# Patient Record
Sex: Male | Born: 1950 | Race: White | Hispanic: No | Marital: Married | Smoking: Former smoker
Health system: Southern US, Community
[De-identification: ages and names within clinical notes are randomized; demographics above are authoritative.]

## PROBLEM LIST (undated history)

## (undated) ENCOUNTER — Emergency Department (HOSPITAL_COMMUNITY): Admission: EM | Payer: Medicare Other | Source: Home / Self Care

## (undated) ENCOUNTER — Emergency Department (HOSPITAL_COMMUNITY): Admission: EM | Payer: Medicare Other

## (undated) DIAGNOSIS — Z952 Presence of prosthetic heart valve: Secondary | ICD-10-CM

## (undated) DIAGNOSIS — B1920 Unspecified viral hepatitis C without hepatic coma: Secondary | ICD-10-CM

## (undated) DIAGNOSIS — F0390 Unspecified dementia without behavioral disturbance: Secondary | ICD-10-CM

## (undated) DIAGNOSIS — J449 Chronic obstructive pulmonary disease, unspecified: Secondary | ICD-10-CM

## (undated) DIAGNOSIS — Z9089 Acquired absence of other organs: Secondary | ICD-10-CM

## (undated) DIAGNOSIS — T48201A Poisoning by unspecified drugs acting on muscles, accidental (unintentional), initial encounter: Secondary | ICD-10-CM

## (undated) DIAGNOSIS — Z8719 Personal history of other diseases of the digestive system: Secondary | ICD-10-CM

## (undated) DIAGNOSIS — F32A Depression, unspecified: Secondary | ICD-10-CM

## (undated) DIAGNOSIS — E119 Type 2 diabetes mellitus without complications: Secondary | ICD-10-CM

## (undated) DIAGNOSIS — K219 Gastro-esophageal reflux disease without esophagitis: Secondary | ICD-10-CM

## (undated) DIAGNOSIS — R569 Unspecified convulsions: Secondary | ICD-10-CM

## (undated) DIAGNOSIS — K661 Hemoperitoneum: Secondary | ICD-10-CM

## (undated) DIAGNOSIS — R7303 Prediabetes: Secondary | ICD-10-CM

## (undated) DIAGNOSIS — R600 Localized edema: Secondary | ICD-10-CM

## (undated) DIAGNOSIS — F329 Major depressive disorder, single episode, unspecified: Secondary | ICD-10-CM

## (undated) DIAGNOSIS — E7800 Pure hypercholesterolemia, unspecified: Secondary | ICD-10-CM

## (undated) DIAGNOSIS — K5792 Diverticulitis of intestine, part unspecified, without perforation or abscess without bleeding: Secondary | ICD-10-CM

## (undated) DIAGNOSIS — F419 Anxiety disorder, unspecified: Secondary | ICD-10-CM

## (undated) DIAGNOSIS — K625 Hemorrhage of anus and rectum: Secondary | ICD-10-CM

## (undated) DIAGNOSIS — Z973 Presence of spectacles and contact lenses: Secondary | ICD-10-CM

## (undated) DIAGNOSIS — N210 Calculus in bladder: Secondary | ICD-10-CM

## (undated) DIAGNOSIS — N200 Calculus of kidney: Secondary | ICD-10-CM

## (undated) DIAGNOSIS — R9431 Abnormal electrocardiogram [ECG] [EKG]: Secondary | ICD-10-CM

## (undated) HISTORY — DX: Presence of prosthetic heart valve: Z95.2

## (undated) HISTORY — PX: KNEE SURGERY: SHX244

## (undated) HISTORY — DX: Personal history of other diseases of the digestive system: Z87.19

## (undated) HISTORY — PX: COLON SURGERY: SHX602

## (undated) HISTORY — PX: CHOLECYSTECTOMY: SHX55

## (undated) HISTORY — DX: Hemoperitoneum: K66.1

## (undated) HISTORY — PX: JOINT REPLACEMENT: SHX530

## (undated) HISTORY — PX: CARDIAC SURGERY: SHX584

## (undated) HISTORY — DX: Acquired absence of other organs: Z90.89

## (undated) HISTORY — DX: Poisoning by unspecified drugs acting on muscles, accidental (unintentional), initial encounter: T48.201A

## (undated) HISTORY — PX: CRANIOTOMY FOR TEMPORAL LOBECTOMY: SUR342

## (undated) HISTORY — PX: HX GALL BLADDER SURGERY/CHOLE: SHX55

## (undated) HISTORY — PX: HX KNEE REPLACMENT: SHX125

## (undated) HISTORY — PX: HX AORTIC VALVE REPLACEMENT: SHX41

## (undated) HISTORY — PX: HX COLONOSCOPY: 2100001147

---
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
## 2000-08-13 ENCOUNTER — Emergency Department: Payer: Self-pay

## 2001-02-04 ENCOUNTER — Inpatient Hospital Stay: Payer: Self-pay

## 2001-02-16 ENCOUNTER — Ambulatory Visit: Payer: Self-pay

## 2001-02-19 ENCOUNTER — Ambulatory Visit: Payer: Self-pay

## 2001-09-27 ENCOUNTER — Emergency Department: Payer: Self-pay

## 2003-09-11 ENCOUNTER — Ambulatory Visit: Payer: Self-pay

## 2005-12-30 ENCOUNTER — Emergency Department: Payer: Self-pay

## 2006-04-05 ENCOUNTER — Emergency Department: Payer: Self-pay

## 2008-04-06 ENCOUNTER — Inpatient Hospital Stay (HOSPITAL_BASED_OUTPATIENT_CLINIC_OR_DEPARTMENT_OTHER)
Admission: EM | Admit: 2008-04-06 | Discharge: 2008-04-06 | Disposition: A | Discharge status: Home / Self Care | Payer: PRIVATE HEALTH INSURANCE

## 2008-04-06 ENCOUNTER — Emergency Department: Payer: Self-pay

## 2008-04-15 HISTORY — PX: HX COLECTOMY: SHX59

## 2011-08-04 ENCOUNTER — Inpatient Hospital Stay
Admission: EM | Admit: 2011-08-04 | Discharge: 2011-08-05 | DRG: 694 | Disposition: A | Discharge status: Home / Self Care | Payer: Managed Care, Other (non HMO) | Attending: Internal Medicine | Admitting: Internal Medicine

## 2011-08-04 ENCOUNTER — Emergency Department (EMERGENCY_DEPARTMENT_HOSPITAL): Payer: Managed Care, Other (non HMO)

## 2011-08-04 ENCOUNTER — Emergency Department
Admission: EM | Admit: 2011-08-04 | Discharge: 2011-08-04 | Disposition: A | Discharge status: Home / Self Care | Payer: Managed Care, Other (non HMO) | Source: Home / Self Care

## 2011-08-04 DIAGNOSIS — Z954 Presence of other heart-valve replacement: Secondary | ICD-10-CM | POA: Insufficient documentation

## 2011-08-04 DIAGNOSIS — N200 Calculus of kidney: Secondary | ICD-10-CM | POA: Insufficient documentation

## 2011-08-04 DIAGNOSIS — G40909 Epilepsy, unspecified, not intractable, without status epilepticus: Secondary | ICD-10-CM | POA: Diagnosis present

## 2011-08-04 DIAGNOSIS — N179 Acute kidney failure, unspecified: Secondary | ICD-10-CM | POA: Diagnosis present

## 2011-08-04 DIAGNOSIS — F17200 Nicotine dependence, unspecified, uncomplicated: Secondary | ICD-10-CM | POA: Diagnosis present

## 2011-08-04 DIAGNOSIS — N210 Calculus in bladder: Secondary | ICD-10-CM | POA: Diagnosis present

## 2011-08-04 DIAGNOSIS — Z9049 Acquired absence of other specified parts of digestive tract: Secondary | ICD-10-CM

## 2011-08-04 DIAGNOSIS — R110 Nausea: Secondary | ICD-10-CM | POA: Insufficient documentation

## 2011-08-04 DIAGNOSIS — R509 Fever, unspecified: Secondary | ICD-10-CM | POA: Insufficient documentation

## 2011-08-04 DIAGNOSIS — Z8619 Personal history of other infectious and parasitic diseases: Secondary | ICD-10-CM

## 2011-08-04 DIAGNOSIS — R1032 Left lower quadrant pain: Secondary | ICD-10-CM | POA: Insufficient documentation

## 2011-08-04 DIAGNOSIS — N1330 Unspecified hydronephrosis: Secondary | ICD-10-CM | POA: Diagnosis present

## 2011-08-04 DIAGNOSIS — N401 Enlarged prostate with lower urinary tract symptoms: Secondary | ICD-10-CM | POA: Diagnosis present

## 2011-08-04 DIAGNOSIS — N135 Crossing vessel and stricture of ureter without hydronephrosis: Secondary | ICD-10-CM | POA: Diagnosis present

## 2011-08-04 HISTORY — DX: Presence of spectacles and contact lenses: Z97.3

## 2011-08-04 HISTORY — DX: Presence of prosthetic heart valve: Z95.2

## 2011-08-04 HISTORY — DX: Diverticulitis of intestine, part unspecified, without perforation or abscess without bleeding: K57.92

## 2011-08-04 LAB — CBC
BASOPHIL #: 0.1 K/uL (ref 0.0–0.10)
BASOPHIL #: 0.1 10*3/uL (ref 0.0–0.10)
BASOPHILS %: 1.1 % (ref 0–2.50)
BASOPHILS %: 0.4 % (ref 0–2.50)
EOSINOPHIL #: 0 K/uL (ref 0.00–0.50)
EOSINOPHIL #: 0 10*3/uL (ref 0.00–0.50)
EOSINOPHIL %: 0.4 % (ref 0.0–5.2)
EOSINOPHIL %: 0.3 % (ref 0.0–5.2)
HCT: 43 % (ref 40.0–54.0)
HCT: 39.5 % — ABNORMAL LOW (ref 40.0–54.0)
HGB: 14.2 g/dL (ref 13.7–18.0)
HGB: 13.1 g/dL — ABNORMAL LOW (ref 13.7–18.0)
LYMPHOCYTE #: 0.9 10*3/uL (ref 0.7–3.20)
LYMPHOCYTE #: 0.8 10*3/uL (ref 0.7–3.20)
LYMPHOCYTE %: 10.7 % — ABNORMAL LOW (ref 15.0–43.0)
LYMPHOCYTE %: 6 % — ABNORMAL LOW (ref 15.0–43.0)
MCH: 31.9 pg (ref 28.3–34.3)
MCH: 32.3 pg (ref 28.3–34.3)
MCHC: 33 g/dL (ref 32.0–36.0)
MCHC: 33.2 g/dL (ref 32.0–36.0)
MCV: 96.8 fL — AB (ref 82.0–100.0)
MCV: 97.4 fL (ref 82.0–100.0)
MONOCYTE #: 0.7 K/uL (ref 0.20–0.90)
MONOCYTE #: 1 10*3/uL — ABNORMAL HIGH (ref 0.20–0.90)
MONOCYTE %: 7.7 % (ref 4.8–12.0)
MONOCYTE %: 7.9 % (ref 4.8–12.0)
MPV: 9.3 fL (ref 7.4–10.45)
MPV: 9.2 fL (ref 7.4–10.45)
NRBC ABSOLUTE: 0 10*3/uL
NRBC ABSOLUTE: 0 K/uL
NRBC: 0.1 /100{WBCs} — ABNORMAL HIGH (ref 0–0)
NRBC: 0 /100 WBC (ref 0–0)
PLATELET COUNT: 159 K/uL (ref 150–400)
PLATELET COUNT: 137 K/uL — ABNORMAL LOW (ref 150–400)
PMN #: 7.1 K/uL — ABNORMAL HIGH (ref 1.5–6.5)
PMN #: 11.3 K/uL — ABNORMAL HIGH (ref 1.5–6.5)
PMN %: 80.1 % — ABNORMAL HIGH (ref 43.0–76.0)
PMN %: 85.4 % — ABNORMAL HIGH (ref 43.0–76.0)
RBC: 4.06 M/uL — ABNORMAL LOW (ref 4.5–6.0)
RDW: 13.6 % (ref 11.0–16.0)
RDW: 13.4 % (ref 11.0–16.0)
WBC: 8.8 10*3/uL (ref 4.0–11.0)
WBC: 13.3 10*3/uL — ABNORMAL HIGH (ref 4.0–11.0)

## 2011-08-04 LAB — COMPREHENSIVE METABOLIC PROFILE - BMC/JMC ONLY
ALBUMIN/GLOBULIN RATIO: 1.3
ALBUMIN: 3.9 g/dL (ref 3.5–5.0)
ALKALINE PHOSPHATASE: 70 IU/L (ref 38–126)
ALT (SGPT): 38 IU/L (ref 17–63)
ANION GAP: 9 mmol/L (ref 3–11)
AST (SGOT): 44 IU/L — ABNORMAL HIGH (ref 15–41)
BILIRUBIN, TOTAL: 1.1 mg/dL (ref 0.3–1.2)
BUN: 24 mg/dL — ABNORMAL HIGH (ref 6–20)
CALCIUM: 9.5 mg/dL (ref 8.8–10.2)
CARBON DIOXIDE: 23 mmol/L (ref 22–32)
CHLORIDE: 103 mmol/L (ref 101–111)
CREATININE: 1.36 mg/dL — ABNORMAL HIGH (ref 0.61–1.24)
ESTIMATED GLOMERULAR FILTRATION RATE: 53 mL/min — ABNORMAL LOW (ref >60)
GLUCOSE: 111 mg/dL — ABNORMAL HIGH (ref 70–110)
POTASSIUM: 4.2 mmol/L (ref 3.4–5.1)
SODIUM: 135 mmol/L — ABNORMAL LOW (ref 136–145)
TOTAL PROTEIN: 6.7 g/dL (ref 6.4–8.3)

## 2011-08-04 LAB — URINALYSIS WITH CULTURE REFLEX IF INDICATED BMC/JMC ONLY
BILIRUBIN,URINE: NEGATIVE mg/dL
GLUCOSE,URINE: NORMAL mg/dL
KETONE, URINE: NEGATIVE mg/dL
NITRITES, URINE: NEGATIVE
PH,URINE: 6.5 (ref 5.0–7.5)
PROTEIN, URINE: 25 mg/dL — AB
SPECIFIC GRAVITY,URINE: 1.02 (ref 1.005–1.020)
UROBILINOGEN,URINE: NORMAL mg/dL (ref 0.2–1.0)

## 2011-08-04 LAB — BASIC METABOLIC PROFILE - BMC/JMC ONLY
ANION GAP: 4 mmol/L (ref 3–11)
BUN: 24 mg/dL — ABNORMAL HIGH (ref 6–20)
CALCIUM: 8.8 mg/dL (ref 8.8–10.2)
CARBON DIOXIDE: 25 mmol/L (ref 22–32)
CHLORIDE: 107 mmol/L (ref 101–111)
CREATININE: 1.57 mg/dL — ABNORMAL HIGH (ref 0.61–1.24)
ESTIMATED GLOMERULAR FILTRATION RATE: 45 mL/min — ABNORMAL LOW (ref >60)
GLUCOSE: 120 mg/dL — ABNORMAL HIGH (ref 70–110)
POTASSIUM: 4.1 mmol/L (ref 3.4–5.1)
SODIUM: 136 mmol/L (ref 136–145)

## 2011-08-04 LAB — MICROSCOPIC,UA REFLEX TO CULT - JMC ONLY

## 2011-08-04 LAB — PT/INR
INR NORMALIZED: 2.04
PROTHROMBIN TIME: 21.6 s (ref 9.8–11.1)

## 2011-08-04 LAB — LIPASE: LIPASE: 37 U/L (ref 22–51)

## 2011-08-04 MED ORDER — ONDANSETRON HCL (PF) 4 MG/2 ML INJECTION SOLUTION
4.0000 mg | Freq: Three times a day (TID) | INTRAMUSCULAR | Status: DC | PRN
Start: 2011-08-04 — End: 2011-08-05
  Filled 2011-08-04: qty 2

## 2011-08-04 MED ORDER — SODIUM CHLORIDE 0.9 % (FLUSH) INJECTION SYRINGE
INJECTION | INTRAMUSCULAR | Status: AC
Start: 2011-08-04 — End: 2011-08-04
  Administered 2011-08-04: 10 mL via INTRAVENOUS
  Filled 2011-08-04: qty 5

## 2011-08-04 MED ORDER — SODIUM CHLORIDE 0.9 % INTRAVENOUS SOLUTION
INTRAVENOUS | Status: DC
Start: 2011-08-04 — End: 2011-08-04
  Administered 2011-08-04: 125 mL/h via INTRAVENOUS

## 2011-08-04 MED ORDER — HYDROMORPHONE 1 MG/ML INJECTION WRAPPER
INJECTION | INTRAMUSCULAR | Status: AC
Start: 2011-08-04 — End: 2011-08-04
  Administered 2011-08-04: 1 mg via INTRAVENOUS
  Filled 2011-08-04: qty 1

## 2011-08-04 MED ORDER — ONDANSETRON HCL (PF) 4 MG/2 ML INJECTION SOLUTION
INTRAMUSCULAR | Status: AC
Start: 2011-08-04 — End: 2011-08-04
  Administered 2011-08-04: 4 mg via INTRAVENOUS
  Filled 2011-08-04: qty 2

## 2011-08-04 MED ORDER — HYDROCODONE 5 MG-ACETAMINOPHEN 325 MG TABLET
1.00 | ORAL_TABLET | Freq: Four times a day (QID) | ORAL | Status: DC | PRN
Start: 2011-08-04 — End: 2012-09-04

## 2011-08-04 MED ORDER — TOPIRAMATE 100 MG TABLET
200.00 mg | ORAL_TABLET | Freq: Two times a day (BID) | ORAL | Status: DC
Start: 2011-08-04 — End: 2011-08-05
  Administered 2011-08-04: 0 mg via ORAL
  Administered 2011-08-05: 200 mg via ORAL

## 2011-08-04 MED ORDER — PROMETHAZINE 25 MG/ML INJECTION SOLUTION - IV
12.50 mg | INTRAMUSCULAR | Status: AC
Start: 2011-08-04 — End: 2011-08-04

## 2011-08-04 MED ORDER — TOPIRAMATE 100 MG TABLET
ORAL_TABLET | ORAL | Status: DC
Start: 2011-08-04 — End: 2011-08-05
  Administered 2011-08-04: 0 mg
  Filled 2011-08-04: qty 2

## 2011-08-04 MED ORDER — KETOROLAC 15 MG/ML INJECTION SOLUTION
30.00 mg | INTRAMUSCULAR | Status: AC
Start: 2011-08-04 — End: 2011-08-04

## 2011-08-04 MED ORDER — SODIUM CHLORIDE 0.9 % (FLUSH) INJECTION SYRINGE
10.0000 mL | INJECTION | Freq: Once | INTRAMUSCULAR | Status: AC
Start: 2011-08-04 — End: 2011-08-04

## 2011-08-04 MED ORDER — ONDANSETRON HCL (PF) 4 MG/2 ML INJECTION SOLUTION
4.00 mg | INTRAMUSCULAR | Status: AC
Start: 2011-08-04 — End: 2011-08-04

## 2011-08-04 MED ORDER — SODIUM CHLORIDE 0.9 % (FLUSH) INJECTION SYRINGE
2.5000 mL | INJECTION | Freq: Three times a day (TID) | INTRAMUSCULAR | Status: DC
Start: 2011-08-04 — End: 2011-08-05
  Administered 2011-08-04: 2.5 mL via INTRAVENOUS
  Administered 2011-08-05: 0 mL via INTRAVENOUS
  Filled 2011-08-04: qty 5

## 2011-08-04 MED ORDER — PROMETHAZINE 25 MG/ML INJECTION SOLUTION
INTRAMUSCULAR | Status: AC
Start: 2011-08-04 — End: 2011-08-04
  Administered 2011-08-04: 12.5 mg via INTRAVENOUS
  Filled 2011-08-04: qty 1

## 2011-08-04 MED ORDER — HYDROMORPHONE 1 MG/ML INJECTION WRAPPER
1.0000 mg | INJECTION | Freq: Once | INTRAMUSCULAR | Status: AC
Start: 2011-08-04 — End: 2011-08-04

## 2011-08-04 MED ORDER — HYDROMORPHONE 1 MG/ML INJECTION WRAPPER
1.00 mg | INJECTION | INTRAMUSCULAR | Status: AC
Start: 2011-08-04 — End: 2011-08-04

## 2011-08-04 MED ORDER — SODIUM CHLORIDE 0.9 % INTRAVENOUS SOLUTION
INTRAVENOUS | Status: DC
Start: 2011-08-04 — End: 2011-08-04
  Administered 2011-08-04: 0 via INTRAVENOUS

## 2011-08-04 MED ORDER — SODIUM CHLORIDE 0.9 % INTRAVENOUS SOLUTION
INTRAVENOUS | Status: DC
Start: 2011-08-04 — End: 2011-08-05

## 2011-08-04 MED ORDER — WARFARIN 5 MG TABLET
10.00 mg | ORAL_TABLET | Freq: Every evening | ORAL | Status: DC
Start: 2011-08-04 — End: 2011-08-05
  Administered 2011-08-04: 0 mg via ORAL
  Filled 2011-08-04: qty 2

## 2011-08-04 MED ORDER — SODIUM CHLORIDE 0.9 % IV BOLUS
1000.00 mL | INJECTION | Freq: Once | Status: AC
Start: 2011-08-04 — End: 2011-08-04
  Administered 2011-08-04: 0 mL via INTRAVENOUS
  Administered 2011-08-04 (×2): 1000 mL via INTRAVENOUS

## 2011-08-04 MED ORDER — SODIUM CHLORIDE 0.9 % (FLUSH) INJECTION SYRINGE
10.00 mL | INJECTION | INTRAMUSCULAR | Status: AC
Start: 2011-08-04 — End: 2011-08-04

## 2011-08-04 MED ORDER — ACETAMINOPHEN 325 MG TABLET
650.0000 mg | ORAL_TABLET | ORAL | Status: DC | PRN
Start: 2011-08-04 — End: 2011-08-05

## 2011-08-04 MED ORDER — SODIUM CHLORIDE 0.9 % (FLUSH) INJECTION SYRINGE
INJECTION | INTRAMUSCULAR | Status: AC
Start: 2011-08-04 — End: 2011-08-04
  Administered 2011-08-04: 10 mL via INTRAVENOUS
  Filled 2011-08-04: qty 10

## 2011-08-04 MED ORDER — HYDROMORPHONE (PF) 2 MG/ML INJECTION SYRINGE
2.00 mg | INJECTION | INTRAMUSCULAR | Status: DC | PRN
Start: 2011-08-04 — End: 2011-08-05
  Administered 2011-08-04 – 2011-08-05 (×4): 2 mg via INTRAVENOUS
  Filled 2011-08-04 (×4): qty 1

## 2011-08-04 MED ORDER — ONDANSETRON 4 MG DISINTEGRATING TABLET
4.00 mg | ORAL_TABLET | Freq: Three times a day (TID) | ORAL | Status: DC | PRN
Start: 2011-08-04 — End: 2012-09-04

## 2011-08-04 MED ORDER — ONDANSETRON 4 MG DISINTEGRATING TABLET
4.00 mg | ORAL_TABLET | Freq: Three times a day (TID) | ORAL | Status: DC | PRN
Start: 2011-08-04 — End: 2011-08-05

## 2011-08-04 MED ORDER — KETOROLAC 15 MG/ML INJECTION SOLUTION
INTRAMUSCULAR | Status: AC
Start: 2011-08-04 — End: 2011-08-04
  Administered 2011-08-04: 30 mg via INTRAVENOUS
  Filled 2011-08-04: qty 2

## 2011-08-04 MED ORDER — OXCARBAZEPINE 600 MG TABLET
600.00 mg | ORAL_TABLET | Freq: Two times a day (BID) | ORAL | Status: DC
Start: 2011-08-04 — End: 2011-08-05
  Administered 2011-08-04 – 2011-08-05 (×2): 0 mg via ORAL

## 2011-08-04 NOTE — Nurses Notes (Signed)
Pt arrived to unit via stretcher. No distress noted at this time but pt complains of left side pain. Received bedside report from ED nurse, Beth. Will administer prn meds when available. IV site in place to right hand.

## 2011-08-04 NOTE — ED Provider Notes (Signed)
HPI Comments: This is a 61 year old male with PMH positive for aortic valve replacement, seizure disorder, diverticulitis, and hepatitis C.  The patient presents here with complaints of left sided flank pain radiating to his groin area.  He rates the severity of his pain as 10/10.  He denies any associated fever, chills, or diarrhea.  These symptoms began yesterday.  He presents here for evaluation.    Patient is a 61 y.o. male presenting with abdominal pain. The history is provided by the patient. No language interpreter was used.   Abdominal Pain   This is a new problem. The current episode started yesterday. The problem has been gradually worsening. The pain is associated with an unknown factor. The pain is located in the LLQ. The pain is at a severity of 10/10. Associated symptoms include anorexia, fever and nausea. Pertinent negatives include belching, diarrhea, flatus, hematochezia, melena, vomiting, constipation, dysuria, frequency, hematuria, headaches, arthralgias and myalgias. Nothing aggravates the symptoms. Nothing relieves the symptoms.           Review of Systems   Constitutional: Positive for fever.   Gastrointestinal: Positive for nausea, abdominal pain and anorexia. Negative for vomiting, diarrhea, constipation, melena, hematochezia and flatus.   Genitourinary: Negative for dysuria, frequency and hematuria.   Musculoskeletal: Negative for myalgias and arthralgias.   Neurological: Negative for headaches.           History:   Past Medical History:  Past Medical History   Diagnosis Date   . Aortic valve replaced    . Seizures    . Diverticulitis    . Hepatitis C        Past Surgical History:  Past Surgical History   Procedure Date   . Hx knee replacment        Social History:  History   Substance Use Topics   . Smoking status: Current Everyday Smoker -- 0.5 packs/day   . Smokeless tobacco: Not on file   . Alcohol Use: No     History   Drug Use No       Family History:  No family history on file.         Physical Exam   Constitutional: He is oriented to person, place, and time. He appears well-developed and well-nourished.   HENT:   Head: Normocephalic and atraumatic.   Eyes: Conjunctivae normal and EOM are normal. Pupils are equal, round, and reactive to light.   Neck: Normal range of motion. Neck supple.   Cardiovascular: Normal rate, regular rhythm and normal heart sounds.    Pulmonary/Chest: Effort normal.   Abdominal: Soft. Bowel sounds are normal.   Musculoskeletal: Normal range of motion.   Neurological: He is alert and oriented to person, place, and time.   Skin: Skin is warm and dry.   Psychiatric: He has a normal mood and affect. His behavior is normal. Judgment and thought content normal.           ED Course:    Results:  Results for orders placed during the hospital encounter of 08/04/11 (from the past 12 hour(s))    -URINALYSIS W/RELFLEX TO CULTURE - [facility] ONLY     SOURCE, URINE                 CC                     COLOR, URINE  yellow  Range: YELLOW     APPEARANCE, URINE             hazy    Range: CLEAR     GLUCOSE,URINE mg/dL           normal  Range: NEGATIVE mg/dL     BILIRUBIN,URINE mg/dL         negative Range: NEGATIVE mg/dL     KETONE, URINE mg/dL           negative Range: NEGATIVE mg/dL     SPECIFIC GRAVITY,URINE        1.020   Range: 1.005 - 1.020     BLOOD, URINE                  5+ (*)  Range: NEGATIVE     PH,URINE                      6.5     Range: 5.0 - 7.5     PROTEIN, URINE mg/dL          25 (*)  Range: NEGATIVE mg/dL     UROBILINOGEN,URINE mg/dL      normal  Range: 0.2 - 1.0 mg/dL     NITRITES, URINE               negative Range: NEGATIVE     LEUKOCYTE ESTERASE, URINE     1+ (*)  Range: NEGATIVE    -MICROSCOPIC,UA REFLEX TO CULT - [facility] ONLY     WBC, URINE /hpf               11-20 (*)Range: 0 - 5 /hpf     RBC,URINE /hpf                6-10 (*)Range: 0 - 3 /hpf     SQUAMOUS EPITHELIAL CELL,UR /hpf  0-2     Range: 1 - 3 /hpf      BACTERIA, URINE               1+ (*)  Range: NONE SEEN    -CBC     WBC K/uL                      8.8     Range: 4.0 - 11.0 K/uL     RBC M/uL                      4.44 (*)Range: 4.5 - 6.0 M/uL     HGB g/dL                      16.1    Range: 13.7 - 18.0 g/dL     HCT %                         43.0    Range: 40.0 - 54.0 %     MCV fL                        96.8 (*)Range: 82.0 - 100.0 fL     MCH pg                        31.9    Range: 28.3 - 34.3 pg     MCHC g/dL  33.0    Range: 32.0 - 36.0 g/dL     RDW %                         13.6    Range: 11.0 - 16.0 %     PLATELET COUNT K/uL           159 (*) Range: 150 - 400 K/uL     MPV fL                        9.3     Range: 7.4 - 10.45 fL     NRBC /100 WBC                 0.1 (*) Range: 0 - 0 /100 WBC     NRBC ABSOLUTE K/uL            0.00                   PMN % %                       80.1 (*)Range: 43.0 - 76.0 %     LYMPHOCYTE % %                10.7 (*)Range: 15.0 - 43.0 %     MONOCYTE % %                  7.7     Range: 4.8 - 12.0 %     EOSINOPHIL % %                0.4     Range: 0.0 - 5.2 %     BASOPHILS % %                 1.1     Range: 0 - 2.50 %     PMN # K/uL                    7.10 (*)Range: 1.5 - 6.5 K/uL     LYMPHOCYTE # K/uL             0.90    Range: 0.7 - 3.20 K/uL     MONOCYTE # K/uL               0.70    Range: 0.20 - 0.90 K/uL     EOSINOPHIL # K/uL             0.00    Range: 0.00 - 0.50 K/uL     BASOPHIL # K/uL               0.10    Range: 0.0 - 0.10 K/uL    -COMPREHENSIVE METABOLIC PROFILE - [facility] ONLY     GLUCOSE mg/dL                 161 (*) Range: 70 - 110 mg/dL     BUN mg/dL                     24 (*)  Range: 6 - 20 mg/dL     CREATININE mg/dL              0.96 (*)Range: 0.61 - 1.24 mg/dL     ESTIMATED GLOMERULAR FILTRATION RA* ml/min  53 (*)  Range: >60 ml/min  SODIUM mmol/L                 135 (*) Range: 136 - 145 mmol/L     POTASSIUM mmol/L              4.2     Range: 3.4 - 5.1 mmol/L      CHLORIDE mmol/L               103     Range: 101 - 111 mmol/L     CARBON DIOXIDE mmol/L         23      Range: 22 - 32 mmol/L     ANION GAP mmol/L              9       Range: 3 - 11 mmol/L     CALCIUM mg/dL                 9.5     Range: 8.8 - 10.2 mg/dL     TOTAL PROTEIN g/dL            6.7     Range: 6.4 - 8.3 g/dL     ALBUMIN g/dL                  3.9     Range: 3.5 - 5.0 g/dL     ALBUMIN/GLOBULIN RATIO        1.3                    BILIRUBIN, TOTAL mg/dL        1.1     Range: 0.3 - 1.2 mg/dL     AST (SGOT) IU/L               44 (*)  Range: 15 - 41 IU/L     ALT (SGPT) IU/L               38      Range: 17 - 63 IU/L     ALKALINE PHOSPHATASE IU/L     70      Range: 38 - 126 IU/L    -LIPASE     LIPASE U/L                    37      Range: 22 - 51 U/L    CT ABDOMEN PELVIS WO IV CONTRAST (Preliminary result)   Result time:08/04/11 0706     ED Interpretation     6mm stone in the proximal left ureter with mild-moderate  hydronephrosis and perinephric stranding. 9mm left upper pole  nononstructing stone. Multiple large calcific densities in the  bladder likely relate to stones.       ED Course:  This patient is feeling much better after getting analgesia, and hydration.  Will discharge the patient to home with urology follow up.  His pain was controlled with one shot of dilaudid, and torodol.  He is in stable condition.    Evaluation / Plan:

## 2011-08-04 NOTE — Nurses Notes (Signed)
Coumadin booklet given to pt, pt denies any questions at this time.

## 2011-08-04 NOTE — H&P (Signed)
[facility]   General Medicine  Admission H&P    Date of Service:  08/04/2011  [name], 61 y.o. male  Date of Admission:  08/04/2011  Date of Birth:  April 10, 1951  PCP: Outside Medical Doctor    Information Obtained from: patient and health care provider  Chief Complaint:  Flank pain    HPI: [name] is a 61 y.o., White male who presents with flank pain.  Pt had onset of pain 2 days ago causing him to present to ED where he was diagnosed with kidney stones.  At that time he was treated with pain meds and discharged to home only to have him return this afternoon with increased pain not relieved with oral medication.  Pt states some nausea without vomiting, fever, or chills.    PAST MEDICAL:    Past Medical History   Diagnosis Date   . Aortic valve replaced    . Diverticulitis    . Hepatitis C    . Seizures      doesn't remember when last seizure was   . Wears glasses    . Hepatitis C     Past Surgical History   Procedure Date   . Hx knee replacment    . Hx gall bladder surgery/chole    . Hx aortic valve replacement         Medications Prior to Admission     Medication    OXcarbazepine (TRILEPTAL) 600 mg Oral Tablet    Take 600 mg by mouth Twice daily    Topiramate (TOPAMAX) 200 mg Oral Tablet    Take 200 mg by mouth Twice daily    warfarin (COUMADIN) 10 mg Oral Tablet    Take 10 mg by mouth QPM    hydrocodone-Acetaminophen (NORCO) 5-325 mg Oral Tablet tablet    Take 1 Tab by mouth Every 6 hours as needed for Pain    ondansetron (ZOFRAN ODT) 4 mg Oral Tablet, Rapid Dissolve    1 Tab (4 mg total) by Sublingual route Every 8 hours as needed for nausea/vomiting        No Known Allergies    Vaccinations:      Pneumovax: Does not meet criteria to receive.    Influenza: Not current vaccination season.    Family History  No family history on file.    Social History  History     Social History   . Marital Status: Married     Spouse Name: [name]/A     Number of Children: [name]/A    . Years of Education: [name]/A     Occupational History   . Not on file.     Social History Main Topics   . Smoking status: Current Everyday Smoker -- 0.5 packs/day   . Smokeless tobacco: Not on file   . Alcohol Use: Yes      rarely   . Drug Use: No   . Sexually Active: Not on file     Other Topics Concern   . Not on file     Social History Narrative   . No narrative on file       ROS: Other than ROS in the HPI, all other systems were negative.    Examination:  Temperature: 36.4 C (97.5 F) Heart Rate: 70  BP (Non-Invasive): 109/68 mmHg   Respiratory Rate: 18  SpO2-1: 99 % Pain Score: 6   General: moderate distress and vital signs reviewed  Eyes: Conjunctiva clear., Pupils equal  and round, reactive to light and accomodation.   HENT:Head atraumatic and normocephalic  Neck: No JVD or thyromegaly or lymphadenopathy  Lungs: Clear to auscultation bilaterally.   Cardiovascular: regular rate and rhythm  systolic murmur: early systolic 3/6, crescendo and decrescendo at lower left sternal border  Abdomen: Soft, non-tender, Bowel sounds normal, non-distended  Genito-urinary: Deferred  Extremities: No cyanosis or edema  Skin: Skin warm and dry  Neurologic: Grossly normal, CN II - XII grossly intact , Alert and oriented x3  Lymphatics: No lymphadenopathy  Psychiatric: Normal affect, behavior, memory, thought content, judgement, and speech.    Labs:    Lab Results for Last 24 Hours:    Results for orders placed during the hospital encounter of 08/04/11 (from the past 24 hour(s))   PT/INR       Component Value Range    PROTHROMBIN TIME 21.6 (*) 9.8 - 11.1 sec    INR NORMALIZED 2.04     CBC       Component Value Range    WBC 13.3 (*) 4.0 - 11.0 K/uL    RBC 4.06 (*) 4.5 - 6.0 M/uL    HGB 13.1 (*) 13.7 - 18.0 g/dL    HCT 16.1 (*) 09.6 - 54.0 %    MCV 97.4  82.0 - 100.0 fL    MCH 32.3  28.3 - 34.3 pg    MCHC 33.2  32.0 - 36.0 g/dL    RDW 04.5  40.9 - 81.1 %    PLATELET COUNT 137 (*) 150 - 400 K/uL    MPV 9.2  7.4 - 10.45 fL     NRBC 0  0 - 0 /100 WBC    NRBC ABSOLUTE 0.00      PMN % 85.4 (*) 43.0 - 76.0 %    LYMPHOCYTE % 6.0 (*) 15.0 - 43.0 %    MONOCYTE % 7.9  4.8 - 12.0 %    EOSINOPHIL % 0.3  0.0 - 5.2 %    BASOPHILS % 0.4  0 - 2.50 %    PMN # 11.30 (*) 1.5 - 6.5 K/uL    LYMPHOCYTE # 0.80  0.7 - 3.20 K/uL    MONOCYTE # 1.00 (*) 0.20 - 0.90 K/uL    EOSINOPHIL # 0.00  0.00 - 0.50 K/uL    BASOPHIL # 0.10  0.0 - 0.10 K/uL   BASIC METABOLIC PROFILE - [facility] ONLY       Component Value Range    GLUCOSE 120 (*) 70 - 110 mg/dL    BUN 24 (*) 6 - 20 mg/dL    CREATININE 9.14 (*) 0.61 - 1.24 mg/dL    ESTIMATED GLOMERULAR FILTRATION RATE 45 (*) >60 ml/min    SODIUM 136  136 - 145 mmol/L    POTASSIUM 4.1  3.4 - 5.1 mmol/L    CHLORIDE 107  101 - 111 mmol/L    CARBON DIOXIDE 25  22 - 32 mmol/L    ANION GAP 4  3 - 11 mmol/L    CALCIUM 8.8  8.8 - 10.2 mg/dL   URINALYSIS W/RELFLEX TO CULTURE - [facility] ONLY       Component Value Range    SOURCE, URINE CC      COLOR, URINE yellow  YELLOW    APPEARANCE, URINE hazy  CLEAR    GLUCOSE,URINE normal  NEGATIVE mg/dL    BILIRUBIN,URINE negative  NEGATIVE mg/dL    KETONE, URINE negative  NEGATIVE mg/dL    SPECIFIC GRAVITY,URINE 1.020  1.005 - 1.020    BLOOD, URINE 5+ (*) NEGATIVE    PH,URINE 6.5  5.0 - 7.5    PROTEIN, URINE 25 (*) NEGATIVE mg/dL    UROBILINOGEN,URINE normal  0.2 - 1.0 mg/dL    NITRITES, URINE negative  NEGATIVE    LEUKOCYTE ESTERASE, URINE 1+ (*) NEGATIVE   MICROSCOPIC,UA REFLEX TO CULT - [facility] ONLY       Component Value Range    WBC, URINE 11-20 (*) 0 - 5 /hpf    RBC,URINE 6-10 (*) 0 - 3 /hpf    SQUAMOUS EPITHELIAL CELL,UR 0-2  1 - 3 /hpf    BACTERIA, URINE 1+ (*) NONE SEEN   CBC       Component Value Range    WBC 8.8  4.0 - 11.0 K/uL    RBC 4.44 (*) 4.5 - 6.0 M/uL    HGB 14.2  13.7 - 18.0 g/dL    HCT 96.0  45.4 - 09.8 %    MCV 96.8 (*) 82.0 - 100.0 fL    MCH 31.9  28.3 - 34.3 pg    MCHC 33.0  32.0 - 36.0 g/dL    RDW 11.9  14.7 - 82.9 %    PLATELET COUNT 159 (*) 150 - 400 K/uL     MPV 9.3  7.4 - 10.45 fL    NRBC 0.1 (*) 0 - 0 /100 WBC    NRBC ABSOLUTE 0.00      PMN % 80.1 (*) 43.0 - 76.0 %    LYMPHOCYTE % 10.7 (*) 15.0 - 43.0 %    MONOCYTE % 7.7  4.8 - 12.0 %    EOSINOPHIL % 0.4  0.0 - 5.2 %    BASOPHILS % 1.1  0 - 2.50 %    PMN # 7.10 (*) 1.5 - 6.5 K/uL    LYMPHOCYTE # 0.90  0.7 - 3.20 K/uL    MONOCYTE # 0.70  0.20 - 0.90 K/uL    EOSINOPHIL # 0.00  0.00 - 0.50 K/uL    BASOPHIL # 0.10  0.0 - 0.10 K/uL   COMPREHENSIVE METABOLIC PROFILE - [facility] ONLY       Component Value Range    GLUCOSE 111 (*) 70 - 110 mg/dL    BUN 24 (*) 6 - 20 mg/dL    CREATININE 5.62 (*) 0.61 - 1.24 mg/dL    ESTIMATED GLOMERULAR FILTRATION RATE 53 (*) >60 ml/min    SODIUM 135 (*) 136 - 145 mmol/L    POTASSIUM 4.2  3.4 - 5.1 mmol/L    CHLORIDE 103  101 - 111 mmol/L    CARBON DIOXIDE 23  22 - 32 mmol/L    ANION GAP 9  3 - 11 mmol/L    CALCIUM 9.5  8.8 - 10.2 mg/dL    TOTAL PROTEIN 6.7  6.4 - 8.3 g/dL    ALBUMIN 3.9  3.5 - 5.0 g/dL    ALBUMIN/GLOBULIN RATIO 1.3      BILIRUBIN, TOTAL 1.1  0.3 - 1.2 mg/dL    AST (SGOT) 44 (*) 15 - 41 IU/L    ALT (SGPT) 38  17 - 63 IU/L    ALKALINE PHOSPHATASE 70  38 - 126 IU/L   LIPASE       Component Value Range    LIPASE 37  22 - 51 U/L       Imaging Studies:  CT:     EXAMINATION: UNENHANCED CT OF THE ABDOMEN AND PELVIS (RENAL STONE PROTOCOL).  REASON FOR PROCEDURE: Rule out kidney stones.   REPORT: The current examination is compared to a previous study of April 06, 2008. There has been interval cholecystectomy.   There also has been interval development of multiple bilateral nonobstructing intrarenal calculi. In addition, there is evidence of 2 obstructing left proximal ureteral calculi with upstream hydroureter, hydronephrosis, and perinephric stranding. In addition, there is evidence of multiple (3) faceted stones in the bladder lumen posteriorly measuring approximately 1-1.5 cm in diameter.    The remainder of the intra-abdominal organ systems is unremarkable. No evidence of free fluid, free air, mass effect, or lymphadenopathy. Unremarkable, unenhanced bowel.   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   IMPRESSION:   Obstructing proximal left ureteral calculi (2).   Intraluminal bladder stones.    DNR Status:  Full Code    Assessment/Plan:   Active Hospital Problems    Diagnosis   . Primary Problem: Kidney stone     Admit for pain control  IVF and strain all urine  Consult Dr Tonie Griffith  Continue home meds    DVT/PE Prophylaxis: Warfarin    [name], MD 08/04/2011, 10:29 PM

## 2011-08-04 NOTE — ED Nurses Note (Signed)
PT VERY UPSET REQUESTING MORE PAIN MEDS CASE DISCUSSED WITH DR. Freida Busman AND NO FUTHER RX FOR PAIN MEDS  GIVEN . PT INSTRUCTED TO SIGN IN FOR RECHECK [facility] WOULD BE GLAD TO RE-EVAL.PT.

## 2011-08-04 NOTE — ED Provider Notes (Signed)
HPI Comments: Patient was seen here this am for kidney stones.  He was sent home with Hydrocodone and was instructed to take 1 tab every 6 hrs for pain.  He states that they only last about an hour and a half and he was given 10 pills which will not last. He is returning here for follow up and wishing to receive a different pain med.    Patient is a 61 y.o. male presenting with flank pain. The history is provided by the patient.   Flank Pain  This is a new problem. The current episode started yesterday. The problem occurs constantly. The problem has not changed since onset.Pertinent negatives include no chest pain, no abdominal pain, no headaches and no shortness of breath. The symptoms are aggravated by exertion. The symptoms are relieved by rest. He has tried rest for the symptoms. The treatment provided mild relief.       61 Y/O MALE C/O LEFT FLANK PAIN. PT WAS SEEN IN THE ED EARLIER THIS AM AND WAS DIAGNOSED WITH A 6 MM KIDNEY STONE. PT STS THAT HE WAS SENT HOME WITH PERCOCET AND ZOFRAN THAT HAS "ONLY BEEN WORKING FOR AN HOUR AT A TIME" TO RELIEVE HIS SYMPTOMS. PT DENIES ANY NAUSEA, VOMITING, OR FEVER. PT STS THAT HE HAS RETURNED TO THE ED FOR SOMETHING TO HELP MANAGE HIS PAIN. PT HX OF AORTIC VALVE REPLACEMENT, SEIZURES,  AND HEPATITIS C.     Review of Systems   Constitutional: Negative for fever and diaphoresis.   HENT: Negative for sore throat and neck pain.    Respiratory: Negative for cough and shortness of breath.    Cardiovascular: Negative for chest pain.   Gastrointestinal: Negative for nausea, vomiting, abdominal pain and diarrhea.   Genitourinary: Positive for hematuria and flank pain (Moderate. Left flank pain.).   Musculoskeletal: Negative for back pain.   Skin: Negative for rash.   Neurological: Negative for syncope and headaches.   Psychiatric/Behavioral: Negative for confusion.     Past Medical History:  Past Medical History   Diagnosis Date   . Aortic valve replaced    . Seizures     . Diverticulitis    . Hepatitis C        Allergies:  Review of patient's allergies indicates no known allergies.    Past Surgical History:  Past Surgical History   Procedure Date   . Hx knee replacment        Social History:  History   Substance Use Topics   . Smoking status: Current Everyday Smoker -- 0.5 packs/day   . Smokeless tobacco: Not on file   . Alcohol Use: No     History   Drug Use No       Family History:  No family history on file.    Home Meds: The patient's home medications have been reviewed.    Physical Exam   Nursing note and vitals reviewed.    BP 128/72   Pulse 66   Temp 36.4 C (97.6 F)   Resp 16   Ht 1.829 m (6')   Wt 94.348 kg (208 lb)   BMI 28.21 kg/m2   SpO2 99%  Constitutional:  Well developed, well nourished.  Awake & alert. No distress.  Head:  Atraumatic.  Normocephalic.    Eyes:  PERRL.  EOMI.  Conjunctivae are not pale.  ENT:  Mucous membranes are moist and intact.  Oropharynx is clear and symmetric.  Patent airway.  Neck:  Supple.  Full ROM.  No JVD.  No lymphadenopathy.  Cardiovascular:  Regular rate.  Regular rhythm.  No murmurs, rubs, or gallops.    Pulmonary/Chest:  No evidence of respiratory distress.  Clear to auscultation bilaterally.  No wheezing, rales or rhonchi. Chest non-tender.  Abdominal:  Soft and non-distended.  There is no tenderness.  No rebound, guarding, or rigidity.  No organomegaly.  Good bowel sounds.    Back:  Mild left CVA tenderness. FROM.   Extremities:  No edema.   No cyanosis.  No clubbing.  Full range of motion in all extremities.  No calf tenderness.  Skin:  Skin is warm and dry.  No diaphoresis. No rash.   Neurological:  Alert, awake, and appropriate.  Normal speech.  Sensation normal. Motor strengths 5/5.   Psychiatric:  Good eye contact.  Normal interaction, affect, and behavior.    Course  Results for orders placed during the hospital encounter of 08/04/11 (from the past 12 hour(s))   PT/INR       Component Value Range     PROTHROMBIN TIME 21.6 (*) 9.8 - 11.1 sec    INR NORMALIZED 2.04     CBC       Component Value Range    WBC 13.3 (*) 4.0 - 11.0 K/uL    RBC 4.06 (*) 4.5 - 6.0 M/uL    HGB 13.1 (*) 13.7 - 18.0 g/dL    HCT 21.3 (*) 08.6 - 54.0 %    MCV 97.4  82.0 - 100.0 fL    MCH 32.3  28.3 - 34.3 pg    MCHC 33.2  32.0 - 36.0 g/dL    RDW 57.8  46.9 - 62.9 %    PLATELET COUNT 137 (*) 150 - 400 K/uL    MPV 9.2  7.4 - 10.45 fL    NRBC 0  0 - 0 /100 WBC    NRBC ABSOLUTE 0.00      PMN % 85.4 (*) 43.0 - 76.0 %    LYMPHOCYTE % 6.0 (*) 15.0 - 43.0 %    MONOCYTE % 7.9  4.8 - 12.0 %    EOSINOPHIL % 0.3  0.0 - 5.2 %    BASOPHILS % 0.4  0 - 2.50 %    PMN # 11.30 (*) 1.5 - 6.5 K/uL    LYMPHOCYTE # 0.80  0.7 - 3.20 K/uL    MONOCYTE # 1.00 (*) 0.20 - 0.90 K/uL    EOSINOPHIL # 0.00  0.00 - 0.50 K/uL    BASOPHIL # 0.10  0.0 - 0.10 K/uL   BASIC METABOLIC PROFILE - [facility] ONLY       Component Value Range    GLUCOSE 120 (*) 70 - 110 mg/dL    BUN 24 (*) 6 - 20 mg/dL    CREATININE 5.28 (*) 0.61 - 1.24 mg/dL    ESTIMATED GLOMERULAR FILTRATION RATE 45 (*) >60 ml/min    SODIUM 136  136 - 145 mmol/L    POTASSIUM 4.1  3.4 - 5.1 mmol/L    CHLORIDE 107  101 - 111 mmol/L    CARBON DIOXIDE 25  22 - 32 mmol/L    ANION GAP 4  3 - 11 mmol/L    CALCIUM 8.8  8.8 - 10.2 mg/dL         ---------------------- MEDICAL DECISION MAKING -----------------------      Vital Signs: Reviewed the patient?s vital signs.   Pulse oximetry interpretation: 98% room air. Non hypoxic.  Nursing Notes: Reviewed and utilized the nursing notes.      Old Medical Records: Reviewed    Laboratory Studies: Ordered and independently interpreted laboratory tests, see above.     Additional MDM and Provider Notes:     EXAMINATION: UNENHANCED CT OF THE ABDOMEN AND PELVIS (RENAL STONE PROTOCOL).   REASON FOR PROCEDURE: Rule out kidney stones.   REPORT: The current examination is compared to a previous study of April 06, 2008. There has been interval cholecystectomy.    There also has been interval development of multiple bilateral nonobstructing intrarenal calculi. In addition, there is evidence of 2 obstructing left proximal ureteral calculi with upstream hydroureter, hydronephrosis, and perinephric stranding. In addition, there is evidence of multiple (3) faceted stones in the bladder lumen posteriorly measuring approximately 1-1.5 cm in diameter.   The remainder of the intra-abdominal organ systems is unremarkable. No evidence of free fluid, free air, mass effect, or lymphadenopathy. Unremarkable, unenhanced bowel.   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   IMPRESSION:   Obstructing proximal left ureteral calculi (2).   Intraluminal bladder stones.   Status post cholecystectomy.    1710 - D/W Dr. Tonie Griffith; Will consult for kidney stone  1803 - D/W Dr. Yetta Barre; Will admit for medical management  ----------------------------- COUNSELING ------------------------------     Counseling: The emergency provider has spoken with the patient and discussed today?s findings, in addition to providing specific details for the plan of care.  Questions are answered and there is agreement with the plan.      ----------------------- IMPRESSION AND DISPOSITION --------------------      CLINICAL IMPRESSION   Ureterolithiasis  H/o AVR  H/o Hepatitis C  H/o Seizure Disorder    DISPOSITION   Disposition: Home  Patient condition: Stable     SCRIBE ATTESTATION   This note is prepared by Luvenia Heller, acting as Scribe for Dr. Bluford Kaufmann     The scribe's documentation has been prepared under my direction and personally reviewed by me in its entirety.  I confirm that the note above accurately reflects all work, treatment, procedures, and medical decision making performed by me, Dr. Bluford Kaufmann

## 2011-08-04 NOTE — ED Nurses Note (Addendum)
 Called Med Surg for pt transfer, no answer.

## 2011-08-04 NOTE — ED Nurses Note (Signed)
Patient was seen here this am for kidney stones.  He was sent home with Hydrocodone and was instructed to take 1 tab every 6 hrs for pain.  He states that they only last about an hour and a half and he was given 10 pills which will not last. He is returning here for follow up and wishing to receive a different pain med.

## 2011-08-04 NOTE — ED Nurses Note (Signed)
Line, labs and meds as ordered.

## 2011-08-04 NOTE — ED Nurses Note (Signed)
pt C/O back pain starting yesterday now radiating to the left abd area, C/O nausea, denies V/D/fever

## 2011-08-04 NOTE — ED Nurses Note (Signed)
Report at bedside to Kathy.

## 2011-08-04 NOTE — ED Nurses Note (Signed)
 Called Med Surg for pt transfer-no answer.

## 2011-08-04 NOTE — ED Nurses Note (Signed)
In room to check pt status. Pt reports improvement in pain from 9/10 to 4/10. VSS. Lungs clear following fluid bolus.

## 2011-08-04 NOTE — ED Nurses Note (Signed)

## 2011-08-04 NOTE — ED Nurses Note (Signed)
Christine in Med Surg asked that we hold pt until 7:20.

## 2011-08-04 NOTE — ED Nurses Note (Signed)
Pt reminded of need for urine specimen-unable at this time.  NAD noted, pt resting as he did not sleep during night, call bell within reach.  Wife at the bedside.

## 2011-08-05 ENCOUNTER — Other Ambulatory Visit: Payer: Self-pay | Admitting: UROLOGY

## 2011-08-05 ENCOUNTER — Inpatient Hospital Stay (HOSPITAL_COMMUNITY): Payer: Managed Care, Other (non HMO)

## 2011-08-05 ENCOUNTER — Encounter
Admission: EM | Disposition: A | Discharge status: Home / Self Care | Payer: Self-pay | Source: Home / Self Care | Attending: Internal Medicine

## 2011-08-05 ENCOUNTER — Inpatient Hospital Stay (HOSPITAL_COMMUNITY)
Admission: RE | Admit: 2011-08-05 | Discharge: 2011-08-05 | Disposition: A | Discharge status: Home / Self Care | Payer: Managed Care, Other (non HMO) | Source: Ambulatory Visit | Attending: UROLOGY | Admitting: UROLOGY

## 2011-08-05 DIAGNOSIS — N138 Other obstructive and reflux uropathy: Secondary | ICD-10-CM

## 2011-08-05 DIAGNOSIS — N210 Calculus in bladder: Secondary | ICD-10-CM

## 2011-08-05 DIAGNOSIS — N139 Obstructive and reflux uropathy, unspecified: Secondary | ICD-10-CM

## 2011-08-05 DIAGNOSIS — N200 Calculus of kidney: Secondary | ICD-10-CM

## 2011-08-05 LAB — BASIC METABOLIC PROFILE - BMC/JMC ONLY
ANION GAP: 5 mmol/L (ref 3–11)
BUN: 24 mg/dL — ABNORMAL HIGH (ref 6–20)
BUN: 25 mg/dL — ABNORMAL HIGH (ref 6–20)
CALCIUM: 8.5 mg/dL — ABNORMAL LOW (ref 8.8–10.2)
CALCIUM: 8.4 mg/dL — ABNORMAL LOW (ref 8.8–10.2)
CARBON DIOXIDE: 24 mmol/L (ref 22–32)
CARBON DIOXIDE: 22 mmol/L (ref 22–32)
CHLORIDE: 106 mmol/L (ref 101–111)
CHLORIDE: 105 mmol/L (ref 101–111)
CREATININE: 1.64 mg/dL — ABNORMAL HIGH (ref 0.61–1.24)
CREATININE: 1.65 mg/dL — ABNORMAL HIGH (ref 0.61–1.24)
ESTIMATED GLOMERULAR FILTRATION RATE: 43 mL/min — ABNORMAL LOW (ref >60)
ESTIMATED GLOMERULAR FILTRATION RATE: 43 mL/min — ABNORMAL LOW (ref >60)
GLUCOSE: 86 mg/dL (ref 70–110)
GLUCOSE: 100 mg/dL (ref 70–110)
POTASSIUM: 4.1 mmol/L (ref 3.4–5.1)
POTASSIUM: 3.8 mmol/L (ref 3.4–5.1)
SODIUM: 135 mmol/L — ABNORMAL LOW (ref 136–145)
SODIUM: 132 mmol/L — ABNORMAL LOW (ref 136–145)

## 2011-08-05 SURGERY — CYSTOSCOPY WITH STONE EXTRACTION
Anesthesia: Monitor Anesthesia Care | Site: Ureter | Wound class: Clean Contaminated Wounds-The respiratory, GI, Genital, or urinary

## 2011-08-05 MED ORDER — LIDOCAINE 2 % MUCOSAL JELLY IN APPLICATOR
Status: AC
Start: 2011-08-05 — End: 2011-08-05
  Filled 2011-08-05: qty 5

## 2011-08-05 MED ORDER — CEFTRIAXONE 1 GRAM/50 ML IN DEXTROSE (ISO-OSMOT) DUPLEX
1.0000 g | INJECTION | Freq: Once | INTRAVENOUS | Status: AC
Start: 2011-08-05 — End: 2011-08-05
  Administered 2011-08-05: 0 g via INTRAVENOUS
  Filled 2011-08-05: qty 50

## 2011-08-05 MED ORDER — TOPIRAMATE 100 MG TABLET
ORAL_TABLET | ORAL | Status: DC
Start: 2011-08-05 — End: 2011-08-05
  Filled 2011-08-05: qty 2

## 2011-08-05 MED ORDER — DEXTROSE 5 % IN WATER (D5W) INTRAVENOUS SOLUTION
1.00 g | INTRAVENOUS | Status: DC
Start: 2011-08-06 — End: 2011-08-05
  Filled 2011-08-05: qty 10

## 2011-08-05 MED ORDER — IOVERSOL 350 MG IODINE/ML INTRAVENOUS SOLUTION
50.00 mL | INTRAVENOUS | Status: AC
Start: 2011-08-05 — End: 2011-08-05
  Administered 2011-08-05: 50 mL via INTRAVENOUS

## 2011-08-05 SURGICAL SUPPLY — 7 items
CATH URET 5FR 70CM WHSTLTP ADPR KINK RST GRAD POLYUR STRL LF  DISP (UROLOGICAL SUPPLIES) ×1 IMPLANT
PACK CYSTO 29470 CS/10 (LAP) ×1 IMPLANT
PEN SURG MRKNG SKIN RLR LRG RESERVOIR REG TIP LBL VIOL STRL LF  6IN (MARK) ×1 IMPLANT
SET 81IN REG CLAMP N-PYRG IRRG 10 GTT/ML STR CSCP DEHP BLADDER STRL LF (IV TUBING & ACCESSORIES) ×1 IMPLANT
SOLUTION POVIODINE IODINE 4OZ CS/72 /EA (WOUND CARE SUPPLY) ×1 IMPLANT
SPONGE SURG 4X4IN 16 PLY RADOPQ BAND VISTEC STRL LF  BLU WHT (WOUND CARE SUPPLY) ×1 IMPLANT
STENT URETERAL MARDIS 18015601 ×1 IMPLANT

## 2011-08-05 NOTE — Nurses Notes (Signed)
Pt up at bedside attempting to urinate. Small drops noted, when asked if having problems urinating, pt denies. Will monitor.

## 2011-08-05 NOTE — Nurses Notes (Signed)
Pt relocated to room 203 for closer observation. Bed alarms turned of in case of any future confusion and for pt safety. Pt medicated for 5/10 RLQ pain.

## 2011-08-05 NOTE — Nurses Notes (Signed)
 Dr. Joshua paged r/t pt being unable to urinate at this time and bladder scan results.

## 2011-08-05 NOTE — Nurses Notes (Signed)
AVS reviewed with patient/care giver.  A written copy of the AVS and discharge instructions was given to the patient/care giver.  Questions sufficiently answered as needed.  Patient/care giver encouraged to follow up with PCP as indicated.  In the event of an emergency, patient/care giver instructed to call 911 or go to the nearest emergency room.     Patient's iv in right wrist/hand area taken out, with cath intact.  Pressure dressing placed over site.

## 2011-08-05 NOTE — Nurses Notes (Signed)
Pt found in another room, states he "got up to use the bathroom down the hall and thought this was his room." Pt oriented to person, place and situation. Will move pt closer to nurses station for closer observation.

## 2011-08-05 NOTE — Nurses Notes (Addendum)
Dr. Yetta Barre notified of urine retention and bladder scan results. Order received to straight cath.

## 2011-08-05 NOTE — Progress Notes (Signed)
[facility]  Medicine Progress Note    [name]  Date of service: 08/05/2011  Date of Admission:  08/04/2011    Hospital Day:  LOS: 1 day   Subjective: Pt continues with pain in  Flank.  Had difficulty with urinating last night with bladder scan showing greater than 500 ml.  Pt straight cath for removal with resolution of discomfort.  Still having problem with urinating but no discomfort at present     Vital Signs:  Temp (24hrs) Max:36.9 C (98.4 F)      Systolic (24hrs), Avg:118 mmHg, Min:109 mmHg, Max:128 mmHg    Diastolic (24hrs), Avg:66 mmHg, Min:60 mmHg, Max:72 mmHg    Temp  Avg: 36.6 C (97.8 F)  Min: 36.4 C (97.5 F)  Max: 36.9 C (98.4 F)  Pulse  Avg: 71.2   Min: 66   Max: 79   Resp  Avg: 18.3   Min: 16   Max: 20   SpO2  Avg: 95.7 %  Min: 88 %  Max: 99 %  Pain Score: 0  Fi02    I/O:  I/O last 24 hours:    Intake/Output Summary (Last 24 hours) at 08/05/11 0901  Last data filed at 08/05/11 0800   Gross per 24 hour   Intake   1864 ml   Output    700 ml   Net   1164 ml     I/O current shift:  04/22 0800 - 04/22 1559  In: 550 [P.O.:550]  Out: -   Blood Sugars: Point of Care Glucose Last 24 Hr Results:  No results found for this basename: GLUCOSEPOC:* in the last 24 hours      Current Facility-Administered Medications:  ondansetron (ZOFRAN ODT) rapid dissolve tablet 4 mg Sublingual Q8H PRN   oxcarbazepine (TRILEPTAL) tablet 600 mg Oral 2x/day   topiramate (TOPAMAX) tablet 200 mg Oral 2x/day   warfarin (COUMADIN) tablet 10 mg Oral QPM   NS premix infusion  Intravenous Continuous   acetaminophen (TYLENOL) tablet 650 mg Oral Q4H PRN   ondansetron (ZOFRAN) 2 mg/mL injection 4 mg Intravenous Q8H PRN   NS flush syringe 2.5 mL Intravenous Q8HRS   HYDROmorphone (DILAUDID) 2 mg/mL injection 2 mg Intravenous Q2H PRN   topiramate (TOPAMAX) tablet ---Cabinet Override          No Known Allergies    Physical Exam:  General: moderate distress and vital signs reviewed   Neck: No JVD or thyromegaly or lymphadenopathy  Lungs: Clear to auscultation bilaterally.   Cardiovascular: regular rate and rhythm  systolic murmur: early systolic 2/6, crescendo and decrescendo at lower left sternal border  Abdomen: Soft, non-tender, Bowel sounds normal, non-distended  Neurologic: sedate from medication    Labs:  Lab Results for Last 24 Hours:    Results for orders placed during the hospital encounter of 08/04/11 (from the past 24 hour(s))   PT/INR       Component Value Range    PROTHROMBIN TIME 21.6 (*) 9.8 - 11.1 sec    INR NORMALIZED 2.04     CBC       Component Value Range    WBC 13.3 (*) 4.0 - 11.0 K/uL    RBC 4.06 (*) 4.5 - 6.0 M/uL    HGB 13.1 (*) 13.7 - 18.0 g/dL    HCT 95.6 (*) 21.3 - 54.0 %    MCV 97.4  82.0 - 100.0 fL    MCH 32.3  28.3 - 34.3 pg  MCHC 33.2  32.0 - 36.0 g/dL    RDW 16.1  09.6 - 04.5 %    PLATELET COUNT 137 (*) 150 - 400 K/uL    MPV 9.2  7.4 - 10.45 fL    NRBC 0  0 - 0 /100 WBC    NRBC ABSOLUTE 0.00      PMN % 85.4 (*) 43.0 - 76.0 %    LYMPHOCYTE % 6.0 (*) 15.0 - 43.0 %    MONOCYTE % 7.9  4.8 - 12.0 %    EOSINOPHIL % 0.3  0.0 - 5.2 %    BASOPHILS % 0.4  0 - 2.50 %    PMN # 11.30 (*) 1.5 - 6.5 K/uL    LYMPHOCYTE # 0.80  0.7 - 3.20 K/uL    MONOCYTE # 1.00 (*) 0.20 - 0.90 K/uL    EOSINOPHIL # 0.00  0.00 - 0.50 K/uL    BASOPHIL # 0.10  0.0 - 0.10 K/uL   BASIC METABOLIC PROFILE - [facility] ONLY       Component Value Range    GLUCOSE 120 (*) 70 - 110 mg/dL    BUN 24 (*) 6 - 20 mg/dL    CREATININE 4.09 (*) 0.61 - 1.24 mg/dL    ESTIMATED GLOMERULAR FILTRATION RATE 45 (*) >60 ml/min    SODIUM 136  136 - 145 mmol/L    POTASSIUM 4.1  3.4 - 5.1 mmol/L    CHLORIDE 107  101 - 111 mmol/L    CARBON DIOXIDE 25  22 - 32 mmol/L    ANION GAP 4  3 - 11 mmol/L    CALCIUM 8.8  8.8 - 10.2 mg/dL   BASIC METABOLIC PROFILE - [facility] ONLY       Component Value Range    GLUCOSE 86  70 - 110 mg/dL    BUN 24 (*) 6 - 20 mg/dL    CREATININE 8.11 (*) 0.61 - 1.24 mg/dL     ESTIMATED GLOMERULAR FILTRATION RATE 43 (*) >60 ml/min    SODIUM 135 (*) 136 - 145 mmol/L    POTASSIUM 4.1  3.4 - 5.1 mmol/L    CHLORIDE 106  101 - 111 mmol/L    CARBON DIOXIDE 24  22 - 32 mmol/L    ANION GAP 5  3 - 11 mmol/L    CALCIUM 8.5 (*) 8.8 - 10.2 mg/dL       Radiology:  No new    Microbiology:  none    PT/OT: No    Consults: Dr Tonie Griffith urology    Hardware (lines, foley's, tubes): IV site ok    Assessment/ Plan:   Active Hospital Problems    Diagnosis   . Primary Problem: Acute renal failure   . Leukocytosis   . Kidney stone     Will add Rocephin  Susp renal issue due to obstruction and hydronephrosis  Check bmpdaiy    DVT/PE Prophylaxis: Warfarin    Disposition Planning: Home discharge      Ruben Norris, MD 08/05/2011, 9:01 AM

## 2011-08-05 NOTE — Nurses Notes (Signed)
Pt tolerated straight cath well. Some resistance noted but no bleeding noted. No stones seen. Will continue to monitor. Pt states some relief after cath complete.

## 2011-08-06 LAB — REFLEX URINE CULTURE - BMC/JMC ONLY: URINE CULTURE: NO GROWTH

## 2011-08-06 MED ORDER — IOVERSOL 350 MG IODINE/ML INTRAVENOUS SYRINGE
25.00 mL | INJECTION | Freq: Once | INTRAVENOUS | Status: AC
Start: 2011-08-05 — End: 2011-08-05
  Administered 2011-08-05: 25 mL via INTRAVENOUS

## 2011-08-06 MED ORDER — LIDOCAINE 2 % MUCOSAL JELLY IN APPLICATOR
10.0000 mL | Freq: Once | Status: AC
Start: 2011-08-05 — End: 2011-08-05
  Administered 2011-08-05: 10 mL via TOPICAL

## 2011-08-06 NOTE — Progress Notes (Addendum)
[facility]  ANESTHESIA PRE-OP EVALUATION    MRN:  [id number]  [name]  61 y.o.  Sex: male     Date of Service: 08/06/2011    Surgeon: Moishe Spice):  Shandra Szymborski, [place] Ouch, MD    Scheduled Procedure:  Procedure(s):  CYSTOSCOPY WITH STONE EXTRACTION    Diagnosis/Pertinent HPI: Ureteral Calculus     Weight: 92.08 kg (203 lb)  Height: 182.9 cm (6')  BP (Non-Invasive): 128/64 mmHg  Heart Rate: 79   Respiratory Rate: 20   Temperature: 36.9 C (98.4 F)  SpO2-1: 88 %    ALLERGY:  No Known Allergies    Medications Prior to Admission     Medication    OXcarbazepine (TRILEPTAL) 600 mg Oral Tablet    Take 600 mg by mouth Twice daily    Topiramate (TOPAMAX) 200 mg Oral Tablet    Take 200 mg by mouth Twice daily    warfarin (COUMADIN) 10 mg Oral Tablet    Take 10 mg by mouth QPM    hydrocodone-Acetaminophen (NORCO) 5-325 mg Oral Tablet tablet    Take 1 Tab by mouth Every 6 hours as needed for Pain    ondansetron (ZOFRAN ODT) 4 mg Oral Tablet, Rapid Dissolve    1 Tab (4 mg total) by Sublingual route Every 8 hours as needed for nausea/vomiting             Past Medical History   Diagnosis Date   . Aortic valve replaced    . Diverticulitis    . Hepatitis C    . Seizures      doesn't remember when last seizure was   . Wears glasses        Pregnant: no    Past Surgical History   Procedure Date   . Hx knee replacment    . Hx gall bladder surgery/chole    . Hx aortic valve replacement        Prior Anesthesia Difficulties:  no    Familial Anesthesia Difficulties: no    History   Substance Use Topics   . Smoking status: Current Everyday Smoker -- 0.5 packs/day   . Smokeless tobacco: Not on file   . Alcohol Use: Yes      rarely       Labs:   BMP:      Lab Results   Component Value Date    SODIUM 132* 08/05/2011    POTASSIUM 3.8 08/05/2011    CHLORIDE 105 08/05/2011    CO2 22 08/05/2011    BUN 25* 08/05/2011    CREATININE 1.65* 08/05/2011    GLUCOSECJ 100 08/05/2011    ANIONGAP 5 08/05/2011     GFR 43* 08/05/2011    CALCIUM 8.4* 08/05/2011     CBC:    Lab Results   Component Value Date    HGB 13.1* 08/04/2011    HCT 39.5* 08/04/2011      Platelets:    Lab Results   Component Value Date    PLTCNT 137* 08/04/2011     Coags:    Lab Results   Component Value Date    PROTHROMTME 21.6* 08/04/2011     TSH:    No results found for this basename: TSH          EKG: n/a    CXR:  n/a      ANESTHESIA DAY OF SURGERY EVALUATION      NPO: over 8 hours  Airway: II (soft palate, uvula,  fauces visible)  Dentition:  fair    Cardiovascular: regular rate and rhythm   Respiratory: Clear to auscultation bilaterally.        ASA Status: 3  Proposed Anesthesia: GENERAL without intubation      Pre-Induction Evaluation and informed consent including risks, benefits, and alternatives. Patient was seen and evaluated immediately prior to the induction of anesthesia.        Roseanne Kaufman, CRNA 08/05/2011, [phone]

## 2011-08-06 NOTE — Consults (Signed)
 [facility]                                   [facility]                                  [place], [place] [address]                                                                  CONSULTATION    PATIENT NAME: AMAURIS, DEBOIS  HOSPITAL NUMBER:[id number]  DATE OF SERVICE:08/05/2011  DATE OF BIRTH: [birth year]    UROLOGY CONSULTATION:     REQUESTING PHYSICIAN: Dr. Lamar Molt.     The patient is on station 1, room:203.      REASON FOR CONSULTATION:  Left renal colic.    HISTORY OF PRESENT ILLNESS:  The patient is a 61 year old male who presented to the Emergency Department last night with a history of severe pain at the left back with radiation to the flank and genitalia associated with nausea and vomiting.  He had a previous visit to the Emergency Department with the same condition and, hence, the patient has been admitted by Dr. Molt and I have been called for the consultation.      He has been noticing blood in his urine off and on for the past 2 months.  He also has problems of urination with history of frequency and urgency.  He has been having nocturia.  There has been no history of passing any stone in the urine.  There has been no history of diabetes or hypertension.      ALLERGIES:  He is not allergic to any medication.      SURGICAL HISTORY:  He has had a cholecystectomy, colon resection for diverticulitis and also mitral valve replacement and also had left knee replacement at the past.   During the mitral valve replacement, he was noted to have hepatitis C and was treated with interferon for almost 4-6 weeks,  after which he has not had any further followup.  He has a history of seizure disorder and has been on medication.  He has been on Coumadin for mitral valve replacement.    PHYSICAL EXAMINATION:  His physical examination revealed a soft abdomen with no organomegaly.  His left costovertebral region and flank were tender to deep palpation.  Hernial  orifices were normal.    GENITALIA:  Normal.    RECTAL:  Not performed at this time.    IMAGING:  He had a CT pyelogram which showed evidence of left proximal ureteral calculus just below the UP junction and also had another stone in the left kidney.  He had hydronephrosis on the left side.    IMPRESSION:  1.  Left renal colic secondary to left ureteral calculus with hydronephrosis.  2.  Left renal calculus.  3.  Benign prostatic enlargement to rule out obstruction with symptoms or prostatism.  4.  Status post colon resection with diverticulitis and status post mitral valve replacement and also left knee replacement.    PLAN:  Cystoscopy and possible left ureteral stone  extraction and possible stent placement.    Thank you, Beryl, for asking me to see this patient.  I shall follow the patient with you.      Emery Roulette, MD      FF/[id number]; D: 08/05/2011 17:19:48; T: 08/06/2011 05:00:59    cc: Lamar Molt MD      CHRISTINIA

## 2011-08-06 NOTE — OR PostOp (Cosign Needed)
 ANESTHESIA POSTOP EVALUATION NOTE    Semel,Nashawn N, 61 y.o. male  Date of Birth:  [birth year]    08/06/2011      I have reviewed and evaluated the following:    Respiratory Function: Regular and unlabored breathing, consistent with pre-anesthetic level  Cardiovascular Function:  Vital signs are stable, see Anesthesia Service Document, consistent with pre-anesthetic level  Mental Status: Awake and alert  Pain:  Sufficiently controlled without analgesia  Nausea and Vomiting:  Sufficiently controlled without antiemetic    Post operative complications: None          Carlin VEAR Stable, CRNA 08/05/2011, [phone]

## 2011-08-06 NOTE — OR Surgeon (Signed)
[facility]                                   [facility]                                  [place], [place] [address]                                                                OPERATIVE SUMMARY    PATIENT NAME: Ruben, [name]  HOSPITAL NUMBER:[id number]  DATE OF SERVICE:08/05/2011  DATE OF BIRTH: January 27, 1951    PREOPERATIVE DIAGNOSES:  1.  Left renal colic secondary to left proximal ureteral calculus.  2.  Benign prostatic enlargement, to rule out obstruction.    POSTOPERATIVE DIAGNOSES:  1.  Hematuria.  2.  Left renal calculus.  3.  Multiple bladder calculi, large.  4.  Benign prostatic enlargement with obstruction.    ANESTHESIA:  General anesthesia.    ANESTHETIST:  Hughie Closs, CRNA.    OPERATIVE PROCEDURES:  1.  Cystourethroscopy.  2.  Bilateral retrograde pyelogram.  3.  Left ureteral stent placement.    FINDINGS:  The patient has a normal penile and bulbar urethra.  Prostate showed complete obstruction by a trilobar enlargement of the prostate gland.  Bladder showed multiple bladder calculi; there are at least 3 large calculi.  There were no tumors noted.  Right retrograde pyelogram was normal.  On the left side, she has hydronephrosis and proximal ureteral calculus was pushed up towards into the renal pelvis with a ureteral catheter.  The stent was placed, which was in the normal position.     PROCEDURE:  Under general anesthesia, with the patient in the lithotomy position, the lower part of the abdomen and genitalia were prepped and draped using all aseptic technique.  The urethra was well lubricated with 2% Xylocaine jelly and a size 21-French cystoscope sheath with the cystoscope being inserted under direct vision and the findings were confirmed.  Bilateral retrograde pyelogram were obtained by introducing size 4-French ureteral catheters up into the renal pelvis and the contrast media was injected and the findings were confirmed.  The right side was normal.  The left side showed hydronephrosis and the stone was pushed into the renal pelvis which was seen as the filling defect appearing to be mostly a uric acid stone.  Subsequently, the ureteral catheter was removed and a Glidewire was introduced into the left renal pelvis and on the top of the guidewire, a size 6-French ureteral stent was threaded up into the renal pelvis and the guidewire was removed.  The position of the stent was confirmed with the fluoroscopy.  The cystoscope was removed under direct vision.    The patient tolerated the procedure well and left the operating room in a satisfactory condition.      Johny Shears, MD      [id number]; D: 08/05/2011 17:23:17; T: 08/06/2011 10:58:30    cc: Tor Netters MD      [facility] [address]      [place], [place] [address]

## 2011-08-07 NOTE — Discharge Summary (Signed)
[facility]                                   [facility]                               [place], [place] [address]                                                                         DISCHARGE SUMMARY    PATIENT NAME: [name], [name]  HOSPITAL NUMBER:[id number]  DATE OF BIRTH: Dec 09, 1950    ADMISSION DATE:08/04/2011  DISCHARGE DATE:08/05/2011    FINAL DIAGNOSES:  1.  Left renal calculi colic secondary to left proximal ureteral calculus with hydronephrosis.  2.  Multiple bladder calculi.  3.  Benign prostatic enlargement with obstruction.  4.  Status post mitral wall of the placement.  5.  Status post left total knee arthroplasty.  6.  Status post partial colectomy for diverticulitis.  7.  History of hepatitis C.    HISTORY OF PRESENT ILLNESS:  The patient is a 61 year old male who was admitted by Dr. Yetta Barre on August 04, 2011, to the Emergency Department with renal colic.  The remainder of the history and physical is on the chart.    Please see the attached reports of hospital investigations.     HOSPITAL COURSE AND FOLLOWUP:  This 61 year old male was admitted and made comfortable with analgesics and IV fluids.  He underwent cystoscopy, retrograde pyelogram and left ureteral stent placement.  Postoperatively, he did well, remained afebrile and asymptomatic.  He is being discharged to be cared for as an outpatient, both in the office of Dr. Quincy Simmonds and mine.  The discharge medications are Cipro 500 milligrams by mouth twice a day, Pyridium 100 milligrams by mouth 3 times a day, and Percocet 5/325 milligrams every 6 hours as needed for pain.  He has been advised to go back on his preoperative medication which he has been on.  He has also been advised to go back on regular diet.  He will be followed up at the office of Dr. Quincy Simmonds and also mine.      Johny Shears, MD      [id number]; D: 08/05/2011 17:25:59; T: 08/06/2011 11:53:02    cc: Quincy Simmonds MD       INBASKET     lab summaries

## 2011-08-27 ENCOUNTER — Ambulatory Visit
Admission: RE | Admit: 2011-08-27 | Discharge: 2011-08-27 | Disposition: A | Discharge status: Home / Self Care | Payer: Managed Care, Other (non HMO) | Source: Ambulatory Visit | Attending: EXTERNAL | Admitting: EXTERNAL

## 2011-08-27 DIAGNOSIS — I080 Rheumatic disorders of both mitral and aortic valves: Secondary | ICD-10-CM | POA: Insufficient documentation

## 2011-08-27 LAB — PROTHROMBIN TIME, THERAPEUTIC
COUMADIN-LAST DOSE DATE: 5132013
INR: 1.55

## 2011-09-12 ENCOUNTER — Other Ambulatory Visit: Payer: Self-pay | Admitting: UROLOGY

## 2011-09-12 ENCOUNTER — Ambulatory Visit
Admission: RE | Admit: 2011-09-12 | Discharge: 2011-09-12 | Disposition: A | Discharge status: Home / Self Care | Payer: Managed Care, Other (non HMO) | Source: Ambulatory Visit | Attending: UROLOGY | Admitting: UROLOGY

## 2011-09-12 DIAGNOSIS — N23 Unspecified renal colic: Secondary | ICD-10-CM | POA: Insufficient documentation

## 2012-06-08 ENCOUNTER — Other Ambulatory Visit: Payer: Self-pay

## 2012-06-08 ENCOUNTER — Other Ambulatory Visit (INDEPENDENT_AMBULATORY_CARE_PROVIDER_SITE_OTHER): Payer: Self-pay

## 2012-06-16 ENCOUNTER — Ambulatory Visit: Payer: Managed Care, Other (non HMO)

## 2012-06-18 ENCOUNTER — Ambulatory Visit
Admission: RE | Admit: 2012-06-18 | Discharge: 2012-06-18 | Disposition: A | Discharge status: Home / Self Care | Payer: Managed Care, Other (non HMO) | Source: Ambulatory Visit | Attending: PSYCHIATRY AND NEUROLOGY-NEUROLOGY | Admitting: PSYCHIATRY AND NEUROLOGY-NEUROLOGY

## 2012-06-18 DIAGNOSIS — G40219 Localization-related (focal) (partial) symptomatic epilepsy and epileptic syndromes with complex partial seizures, intractable, without status epilepticus: Secondary | ICD-10-CM | POA: Insufficient documentation

## 2012-06-18 DIAGNOSIS — G609 Hereditary and idiopathic neuropathy, unspecified: Secondary | ICD-10-CM | POA: Insufficient documentation

## 2012-06-18 LAB — CBC
BASOPHIL #: 0.1 K/uL (ref 0.0–0.10)
BASOPHILS %: 1.3 % (ref 0–2.50)
EOSINOPHIL #: 0.1 K/uL (ref 0.00–0.50)
EOSINOPHIL %: 1.5 % (ref 0.0–5.2)
HCT: 44.4 % (ref 40.0–54.0)
HGB: 14.8 g/dL (ref 13.7–18.0)
LYMPHOCYTE #: 1.4 K/uL (ref 0.7–3.20)
LYMPHOCYTE %: 20.8 % (ref 15.0–43.0)
MCH: 31.6 pg (ref 28.3–34.3)
MCHC: 33.4 g/dL (ref 32.0–36.0)
MCV: 94.5 fL (ref 82.0–100.0)
MONOCYTE #: 0.7 K/uL (ref 0.20–0.90)
MONOCYTE %: 10.5 % (ref 4.8–12.0)
MPV: 9.8 fL (ref 7.4–10.45)
NRBC ABSOLUTE: 0 K/uL (ref 0–0.02)
NRBC: 0 /100 WBC (ref 0–0.6)
PLATELET COUNT: 158 K/uL (ref 150–400)
PMN #: 4.3 10*3/uL (ref 1.5–6.5)
PMN %: 65.9 % (ref 43.0–76.0)
RBC: 4.69 M/uL (ref 4.5–6.0)
RDW: 14.7 % (ref 11.0–16.0)
WBC: 6.5 K/uL — AB (ref 4.0–11.0)

## 2012-06-18 LAB — COMPREHENSIVE METABOLIC PROFILE - BMC/JMC ONLY
ALBUMIN/GLOBULIN RATIO: 1.2
ALBUMIN: 3.9 g/dL (ref 3.5–5.0)
ALKALINE PHOSPHATASE: 79 IU/L (ref 38–126)
ALT (SGPT): 32 IU/L (ref 17–63)
ANION GAP: 8 mmol/L (ref 3–11)
AST (SGOT): 37 IU/L (ref 15–41)
BILIRUBIN, TOTAL: 0.7 mg/dL (ref 0.3–1.2)
BUN: 22 mg/dL — ABNORMAL HIGH (ref 6–20)
CALCIUM: 9.1 mg/dL (ref 8.8–10.2)
CARBON DIOXIDE: 25 mmol/L (ref 22–32)
CHLORIDE: 104 mmol/L (ref 101–111)
CREATININE: 1.16 mg/dL (ref 0.61–1.24)
ESTIMATED GLOMERULAR FILTRATION RATE: >60 mL/min (ref >60)
GLUCOSE: 101 mg/dL (ref 70–110)
POTASSIUM: 4.7 mmol/L (ref 3.4–5.1)
SODIUM: 137 mmol/L (ref 136–145)
TOTAL PROTEIN: 7 g/dL (ref 6.4–8.3)

## 2012-06-18 LAB — VITAMIN B12: VITAMIN B12: 492 pg/mL (ref 180–914)

## 2012-06-18 LAB — THYROXINE, FREE (FREE T4): THYROXINE, FREE (FREE T4): 0.57 ng/dL — ABNORMAL LOW (ref 0.61–1.12)

## 2012-06-18 LAB — FOLATE: FOLATE: 10.6 ng/mL (ref >4.5)

## 2012-06-18 LAB — TSH,3RD GENERATION: TSH 3RD GENERATION: 1.326 u[IU]/mL (ref 0.340–5.6)

## 2012-06-26 LAB — TOPIRAMATE, SERUM: TOPIRAMATE (TOPAMAX): 3.5 ug/mL

## 2012-06-26 LAB — LEVETIRACETAM, SERUM: LEVETIRACETAM (KEPPRA): 23.9 ug/mL

## 2012-06-26 LAB — OXCARBAZEPINE METABOLITE (MHC), SERUM: 10 HYDROXYCARBAZEPINE: 23.1 ug/mL (ref 8.0–35.0)

## 2012-06-26 LAB — VAP CHOLESTEROL

## 2012-09-04 ENCOUNTER — Emergency Department (EMERGENCY_DEPARTMENT_HOSPITAL): Payer: Managed Care, Other (non HMO)

## 2012-09-04 ENCOUNTER — Observation Stay (HOSPITAL_COMMUNITY): Payer: PRIVATE HEALTH INSURANCE | Admitting: Family Medicine

## 2012-09-04 ENCOUNTER — Observation Stay
Admission: EM | Admit: 2012-09-04 | Discharge: 2012-09-06 | Disposition: A | Discharge status: Home / Self Care | Payer: Managed Care, Other (non HMO) | Attending: Family Medicine | Admitting: Family Medicine

## 2012-09-04 DIAGNOSIS — Z7901 Long term (current) use of anticoagulants: Secondary | ICD-10-CM | POA: Insufficient documentation

## 2012-09-04 DIAGNOSIS — B1920 Unspecified viral hepatitis C without hepatic coma: Secondary | ICD-10-CM | POA: Insufficient documentation

## 2012-09-04 DIAGNOSIS — Z954 Presence of other heart-valve replacement: Secondary | ICD-10-CM | POA: Insufficient documentation

## 2012-09-04 DIAGNOSIS — K625 Hemorrhage of anus and rectum: Principal | ICD-10-CM | POA: Insufficient documentation

## 2012-09-04 DIAGNOSIS — E871 Hypo-osmolality and hyponatremia: Secondary | ICD-10-CM | POA: Insufficient documentation

## 2012-09-04 DIAGNOSIS — G40909 Epilepsy, unspecified, not intractable, without status epilepticus: Secondary | ICD-10-CM | POA: Insufficient documentation

## 2012-09-04 LAB — CBC
BASOPHIL #: 0.1 K/uL (ref 0.0–0.10)
BASOPHILS %: 1 % (ref 0–2.50)
EOSINOPHIL #: 0.1 10*3/uL (ref 0.00–0.50)
EOSINOPHIL %: 1.2 % (ref 0.0–5.2)
HCT: 35.7 % — AB (ref 40.0–54.0)
HGB: 12.1 g/dL — AB (ref 13.7–18.0)
LYMPHOCYTE #: 1.5 10*3/uL (ref 0.7–3.20)
LYMPHOCYTE %: 28.6 % (ref 15.0–43.0)
MCH: 30.9 pg (ref 28.3–34.3)
MCHC: 34 g/dL (ref 32.0–36.0)
MCV: 90.9 fL — AB (ref 82.0–100.0)
MONOCYTE #: 0.6 K/uL (ref 0.20–0.90)
MONOCYTE %: 10.7 % (ref 4.8–12.0)
MPV: 8.6 fL (ref 7.4–10.45)
NRBC ABSOLUTE: 0 10*3/uL (ref 0–0.02)
NRBC: 0 /100 WBC (ref 0–0.6)
PLATELET COUNT: 169 10*3/uL (ref 150–400)
PMN #: 3 K/uL (ref 1.5–6.5)
PMN %: 58.5 % (ref 43.0–76.0)
RBC: 3.93 M/uL — ABNORMAL LOW (ref 4.5–6.0)
RDW: 14 % (ref 11.0–16.0)
WBC: 5.2 K/uL (ref 4.0–11.0)

## 2012-09-04 LAB — COMPREHENSIVE METABOLIC PROFILE - BMC/JMC ONLY
ALBUMIN/GLOBULIN RATIO: 1.2
ALBUMIN: 3.6 g/dL (ref 3.5–5.0)
ALKALINE PHOSPHATASE: 88 IU/L (ref 38–126)
ALT (SGPT): 30 IU/L (ref 17–63)
ANION GAP: 7 mmol/L (ref 3–11)
AST (SGOT): 45 IU/L — ABNORMAL HIGH (ref 15–41)
BILIRUBIN, TOTAL: 1.3 mg/dL — ABNORMAL HIGH (ref 0.3–1.2)
BUN: 20 mg/dL (ref 6–20)
CALCIUM: 8.7 mg/dL — ABNORMAL LOW (ref 8.8–10.2)
CARBON DIOXIDE: 29 mmol/L (ref 22–32)
CHLORIDE: 93 mmol/L — AB (ref 101–111)
CREATININE: 0.95 mg/dL (ref 0.61–1.24)
ESTIMATED GLOMERULAR FILTRATION RATE: >60 mL/min (ref >60)
GLUCOSE: 88 mg/dL (ref 70–110)
POTASSIUM: 4.3 mmol/L (ref 3.4–5.1)
SODIUM: 129 mmol/L — AB (ref 136–145)
TOTAL PROTEIN: 6.4 g/dL (ref 6.4–8.3)

## 2012-09-04 LAB — OCCULT BLOOD, STOOL: STOOL OCCULT BLOOD, PATIENT: POSITIVE — AB

## 2012-09-04 LAB — PT/INR
INR NORMALIZED: 5.85
PROTHROMBIN TIME: 64.9 s (ref 9.8–11.1)

## 2012-09-04 LAB — LIPASE: LIPASE: 30 U/L (ref 22–51)

## 2012-09-04 MED ORDER — DIATRIZOATE MEGLUMINE-DIATRIZOATE SODIUM 66 %-10 % ORAL SOLUTION
ORAL | Status: AC
Start: 2012-09-04 — End: 2012-09-04
  Filled 2012-09-04: qty 30

## 2012-09-04 MED ORDER — PHYTONADIONE (VITAMIN K1) 5 MG TABLET
2.50 mg | ORAL_TABLET | ORAL | Status: AC
Start: 2012-09-04 — End: 2012-09-04
  Administered 2012-09-04: 2.5 mg via ORAL
  Filled 2012-09-04: qty 1

## 2012-09-04 MED ORDER — OXCARBAZEPINE 300 MG TABLET
600.00 mg | ORAL_TABLET | Freq: Two times a day (BID) | ORAL | Status: DC
Start: 2012-09-05 — End: 2012-09-04

## 2012-09-04 MED ORDER — ACETAMINOPHEN 325 MG TABLET
650.0000 mg | ORAL_TABLET | ORAL | Status: DC | PRN
Start: 2012-09-04 — End: 2012-09-06
  Filled 2012-09-04 (×2): qty 2

## 2012-09-04 MED ORDER — POTASSIUM CHLORIDE 20 MEQ/L IN D5-0.9 % SODIUM CHLORIDE INTRAVENOUS
INTRAVENOUS | Status: DC
Start: 2012-09-05 — End: 2012-09-05
  Filled 2012-09-04 (×2): qty 1000

## 2012-09-04 MED ORDER — OXCARBAZEPINE 300 MG TABLET
600.00 mg | ORAL_TABLET | Freq: Two times a day (BID) | ORAL | Status: DC
Start: 2012-09-05 — End: 2012-09-06
  Administered 2012-09-05 – 2012-09-06 (×3): 600 mg via ORAL
  Filled 2012-09-04 (×2): qty 2

## 2012-09-04 MED ORDER — PHYTONADIONE 10 MG/ML INJECTION - EAST
10.00 mg | INTRAMUSCULAR | Status: DC
Start: 2012-09-04 — End: 2012-09-04

## 2012-09-04 MED ORDER — LEVETIRACETAM 250 MG TABLET
2000.00 mg | ORAL_TABLET | Freq: Two times a day (BID) | ORAL | Status: DC
Start: 2012-09-05 — End: 2012-09-04

## 2012-09-04 MED ORDER — LEVETIRACETAM 250 MG TABLET
2000.00 mg | ORAL_TABLET | Freq: Two times a day (BID) | ORAL | Status: DC
Start: 2012-09-05 — End: 2012-09-05
  Administered 2012-09-05: 1000 mg via ORAL
  Filled 2012-09-04: qty 8

## 2012-09-04 MED ORDER — IOVERSOL 350 MG IODINE/ML INTRAVENOUS SOLUTION
INTRAVENOUS | Status: AC
Start: 2012-09-04 — End: 2012-09-04
  Filled 2012-09-04: qty 100

## 2012-09-04 MED ADMIN — sodium chloride 0.9 % intravenous solution: 1000 mL | INTRAVENOUS | NDC 00338004904

## 2012-09-04 MED ADMIN — diatrizoate meglumine-diatrizoate sodium 66 %-10 % oral solution: 30 mL | ORAL | NDC 00019481604

## 2012-09-04 MED ADMIN — sodium chloride 0.9 % intravenous solution: 0 mL | INTRAVENOUS

## 2012-09-04 MED ADMIN — ioversoL 350 mg iodine/mL intravenous solution: 100 mL | INTRAVENOUS | NDC 00019133311

## 2012-09-04 MED FILL — sodium chloride 0.9 % (flush) injection syringe: 2.5000 mL | INTRAMUSCULAR | Qty: 2.5 | Status: AC

## 2012-09-04 MED FILL — tamsulosin 0.4 mg capsule: 0.4000 mg | ORAL | Qty: 1 | Status: AC

## 2012-09-04 MED FILL — sodium chloride 0.9 % (flush) injection syringe: 2.5000 mL | INTRAMUSCULAR | Qty: 5 | Status: AC

## 2012-09-04 NOTE — ED Attending Note (Signed)
Patient seen and examined, agree with PA A/P

## 2012-09-04 NOTE — ED Nurses Note (Signed)
Dr. Eden Emms at bedside to speak with pt at this time

## 2012-09-04 NOTE — ED Nurses Note (Signed)
 Dr. Marikay in to speak with pt at this time

## 2012-09-04 NOTE — ED Nurses Note (Signed)
Pt awaiting provider exam

## 2012-09-04 NOTE — ED Nurses Note (Signed)
Report to tammy lpn and care of pt assumed

## 2012-09-04 NOTE — ED Nurses Note (Signed)
Pt alert, awaiting result of CT, no needs addressed at this time

## 2012-09-04 NOTE — ED Nurses Note (Signed)
Pt alert, spouse at bedside, no needs addressed, awaiting to go for CT scan

## 2012-09-04 NOTE — ED Nurses Note (Signed)
 Pa yv seeing pt

## 2012-09-04 NOTE — ED Nurses Note (Signed)
Xray notified of completion of contrast at approx 1845

## 2012-09-04 NOTE — H&P (Addendum)
FAMILY MEDICINE SERVICE         INPATIENT HISTORY & PHYSICAL       09/04/2012    PT NAME: [name],[name], 62 y.o. male  DOB: 10-22-1950  PCP: Outside Medical Doctor  ROOM: 04/04  ADMISSION DATE: 09/04/2012  5:12 PM  CODE: Full Code      SUBJECTIVE:   Information Obtained from: patient and spouse  Chief Complaint:  Blood In  Stool     HPI: [name] is a 62 y.o. male with PMH of diverticular disease s/p resection, who presents with acute onset of blood in his stool. [name] noted that last night, he experienced acute stomach pain. Shortly after onset he had a bowel movement, and noted spots of blood on tissue. This morning, [name] had 2 bowel movements of loose stool, and noticed bright red blood in the toilet bowl. He just had another bowel movement while in the ED, and states it currently looks "like chocolate syrup".    ROS: All systems reviewed and are negative, except for the following:  Heme: + for easy bruising  GU: + for bladder stone, occasionally has blood in urine because of this. Was previously assessed by urologist.       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 [employer] Jude. Tested 07/2011, told it was working perfectly.  --Renal and bladder calculi. Followed by Urologist in [place].   --Seizure disorder. Currently taking Keppa and Trileptal. Currently enrolled in clinical trial at [facility].       has past surgical history that includes knee replacment; gall bladder surgery/chole; and aortic valve replacement.      MEDS:  Prior to Admission Medications   Outpatient Medications Last Dose Informant Patient Reported? Taking?   OXcarbazepine (TRILEPTAL) 600 mg Oral Tablet 09/04/2012 at 1700  Yes Yes   Sig: Take 600 mg by mouth Twice daily   Topiramate (TOPAMAX) 200 mg Oral Tablet   Yes No    Sig: Take 200 mg by mouth Twice daily   ciprofloxacin (CIPRO) 500 mg Oral Tablet 09/04/2012 at 0800  Yes Yes   Sig: Take 500 mg by mouth Twice daily   hydrocodone-Acetaminophen (NORCO) 5-325 mg Oral Tablet tablet   No No   Sig: Take 1 Tab by mouth Every 6 hours as needed for Pain   levETIRAcetam (KEPPRA) 500 mg Oral Tablet 09/04/2012 at 1700  Yes Yes   Sig: Take 2,000 mg by mouth Twice daily   ondansetron (ZOFRAN ODT) 4 mg Oral Tablet, Rapid Dissolve   No No   Sig: 1 Tab (4 mg total) by Sublingual route Every 8 hours as needed for nausea/vomiting   tamsulosin (FLOMAX) 0.4 mg Oral Capsule, Sust. Release 24 hr 09/04/2012 at 0800  Yes Yes   Sig: Take 0.4 mg by mouth Every evening after dinner   warfarin (COUMADIN) 10 mg Oral Tablet 09/04/2012 at 1700  Yes Yes   Sig: Take 10 mg by mouth QPM      Facility-Administered Medications: None         ALLERGIES: No Known Allergies      FAM HX:  family history includes COPD in his mother; Heart Disease in his father; Heart Surgery in his father; and Prostate Cancer (age of onset: 34) in his father.    SOC HX:  Retired  04/2012.    reports that he has been smoking.  He does not have any smokeless tobacco history on file. He reports that  drinks alcohol. He reports that he does not use illicit drugs.        OBJECTIVE:  ED Vital Signs   Patient Vitals for the past 24 hrs:   BP Temp Pulse Resp SpO2 Height Weight   09/04/12 2030 125/71 mmHg - 58 20 99 % - -   09/04/12 2000 118/75 mmHg - 60 18 99 % - -   09/04/12 1953 118/72 mmHg - 57 18 99 % - -   09/04/12 1900 116/58 mmHg - - - 100 % - -   09/04/12 1837 111/68 mmHg - 63 20 99 % - -   09/04/12 1716 128/77 mmHg 36.6 C (97.9 F) 61 12 97 % 1.829 m (6' 0.01") 92.987 kg (205 lb)       Current Vital Signs  BP 125/71   Pulse 58   Temp(Src) 36.6 C (97.9 F)   Resp 20   Ht 1.829 m (6' 0.01")   Wt 92.987 kg (205 lb)   BMI 27.8 kg/m2   SpO2 99%  General: appears chronically ill, appears stated age and no distress  HEENT:    Head: normocephalic, atraumatic, normal facial symmetry   Ears: normal external appearance    Eyes: Conjunctiva clear., Pupils equal and round. , Sclera non-icteric.    Nose: Normal external appearance    Throat/Mouth: dry, tacky oral mucosa. No lesions noted. fair dentition.   Neck: No JVD or thyromegaly or lymphadenopathy  Lungs: clear to auscultation bilaterally.   Heart: regular rate and rhythm, S1, S2 normal, mechanical click noted.   Abdomen: soft, bowel sounds normal. Non-distended. No tenderness to palpation of all 4 quadrants   Extremities: extremities normal, atraumatic, no cyanosis or edema  Skin: Skin warm and dry, No rashes and No lesions. Normal skin turgor.   Neurologic: CN II - XII grossly intact.   Psychiatric: normal affect, behavior, memory, thought content, judgement, and speech.      LABS/PROCEDURES:  Results for orders placed during the hospital encounter of 09/04/12 (from the past 12 hour(s))   TYPE AND SCREEN - [facility] ONLY       Result Value Range    ABO/RH(D) A POSITIVE      ANTIBODY SCREEN NEGATIVE     CBC       Result Value Range    WBC 5.2  4.0 - 11.0 K/uL    RBC 3.93 (*) 4.5 - 6.0 M/uL    HGB 12.1 (*) 13.7 - 18.0 g/dL    HCT 96.2 (*) 95.2 - 54.0 %    MCV 90.9 (*) 82.0 - 100.0 fL    MCH 30.9  28.3 - 34.3 pg    MCHC 34.0  32.0 - 36.0 g/dL    RDW 84.1  32.4 - 40.1 %    PLATELET COUNT 169  150 - 400 K/uL    MPV 8.6  7.4 - 10.45 fL    NRBC 0  0 - 0.6 /100 WBC    NRBC ABSOLUTE 0.00  0 - 0.02 K/uL    PMN % 58.5  43.0 - 76.0 %    LYMPHOCYTE % 28.6  15.0 - 43.0 %    MONOCYTE % 10.7  4.8 - 12.0 %    EOSINOPHIL % 1.2  0.0 - 5.2 %    BASOPHILS % 1.0  0 - 2.50 %  PMN # 3.00  1.5 - 6.5 K/uL    LYMPHOCYTE # 1.50  0.7 - 3.20 K/uL    MONOCYTE # 0.60  0.20 - 0.90 K/uL    EOSINOPHIL # 0.10  0.00 - 0.50 K/uL    BASOPHIL # 0.10  0.0 - 0.10 K/uL   COMPREHENSIVE METABOLIC PROFILE - [facility] ONLY       Result Value Range    GLUCOSE 88  70 - 110 mg/dL    BUN 20  6 - 20 mg/dL     CREATININE 1.61  0.96 - 1.24 mg/dL    ESTIMATED GLOMERULAR FILTRATION RATE >60  >60 ml/min    SODIUM 129 (*) 136 - 145 mmol/L    POTASSIUM 4.3  3.4 - 5.1 mmol/L    CHLORIDE 93 (*) 101 - 111 mmol/L    CARBON DIOXIDE 29  22 - 32 mmol/L    ANION GAP 7  3 - 11 mmol/L    CALCIUM 8.7 (*) 8.8 - 10.2 mg/dL    TOTAL PROTEIN 6.4  6.4 - 8.3 g/dL    ALBUMIN 3.6  3.5 - 5.0 g/dL    ALBUMIN/GLOBULIN RATIO 1.2      BILIRUBIN, TOTAL 1.3 (*) 0.3 - 1.2 mg/dL    AST (SGOT) 45 (*) 15 - 41 IU/L    ALT (SGPT) 30  17 - 63 IU/L    ALKALINE PHOSPHATASE 88  38 - 126 IU/L   PT/INR       Result Value Range    PROTHROMBIN TIME 64.9 (*) 9.8 - 11.1 sec    INR NORMALIZED 5.85 (*)    LIPASE       Result Value Range    LIPASE 30  22 - 51 U/L         ASSESSMENT/PLAN  AYAD NIEMAN is a 62 y.o. male with acute bright red blood per rectum .    #1. Blood per rectum  -- Bright red blood more indicative of left colon or anal origin.  -- Remain NPO.  -- Plan for surgical consult in the AM. possible anoscopy or colonoscopy      #2. Hyponatremia  --129 on admission  --Rehydrate with D5 NS + 20 mEq   --Recheck in the morning  --Monitor; severe hyponatremia can induce seizures    #3. Supratherapeutic INR  -- Hold Coumadin  -- Received Vitamin K 2.5 mg PO in the ER  -- Recheck INR in the morning    Stable Medical Conditions  1. Seizure disorder--continue Keppra 2000 mg BID and trileptal 600 mg BID. Not taking any additional medications from seizure study.   2. Mechanical aortic valve.   3. Hepatitis C      DVT Prophylaxis: Supratherapeutic INR   GI Prophylaxis: will order PRN   Pain Control: will order PRN  Discharge Planning: anticipate discharge in 1-2 days, depending on stability and if scope can be done as inpatient.     Patient discussed with Dr. Merlinda Frederick at 945 PM.      Loraine Leriche, MD 09/04/2012, 8:50 PM     ____________________________________________________________________  Late entry note for face to face visit on 09/04/2012.      I saw and examined the patient and discussed management with the resident. I reviewed the resident's note and agree with the documented findings and plan of care except as noted below:    62 y.o. male presenting with frank blood in his stool starting yesterday. 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 He is anticoagulated with coumadin which was increased 3 months ago. The patient and his wife do not think his INR has been checked since that time. He has not had any shortness of breath or chest pain. Since arrival in the ED, he has been hemodynamically stable, HR in the upper 50's, MAP in 80's. INR was elevated at 5.85. Hemoccult was positive and he received 2.5 mg of Vitamin K.     He will be admitted and recheck INR in the AM. Hold coumadin for now. Stable conditions include history of seizure disorder for which he takes Keppra and Trileptal. Continue all other home medications.     Mearl Latin, MD 09/06/2012 9:46 AM

## 2012-09-04 NOTE — ED Provider Notes (Signed)
 [facility]  Emergency Department     HISTORY OF PRESENT ILLNESS     Date:  09/04/2012  Patient's Name:  [name]  Date of Birth:  [birth year]    HPI Comments: This is a 62 yo male who presents with bright red rectal bleeding x 2 days.  He denies chest pain, SOB or dizziness.  States that he had one formed stool yesterday with blood, then had 2 loose stools today which were bloody.  He does indicate a past history of diverticulitis.  He reports a recent cystoscopy one week ago for evaluation of kidney stone which is in his bladder.  Patient currently on coumadin 10 mg daily.    Patient is a 62 y.o. male presenting with blood in stool.   History provided by:  Patient  Blood In  Stool  Pertinent negatives include no abdominal pain, chest pain, chills or fever.       Review of Systems     Review of Systems   Constitutional: Negative for fever and chills.   HENT: Negative.    Eyes: Negative.    Respiratory: Negative.    Cardiovascular: Negative for chest pain.   Gastrointestinal: Positive for diarrhea and blood in stool. Negative for abdominal pain.   Genitourinary: Negative for dysuria.   Musculoskeletal: Negative.    Skin: Negative.    Allergic/Immunologic: Negative.    Neurological: Negative.    Hematological: Negative.    Psychiatric/Behavioral: Negative.        Previous History     Past Medical History:  Past Medical History   Diagnosis Date   . Aortic valve replaced    . Diverticulitis    . Hepatitis C    . Seizures      doesn't remember when last seizure was   . Wears glasses        Past Surgical History:  Past Surgical History   Procedure Laterality Date   . Hx knee replacment     . Hx gall bladder surgery/chole     . Hx aortic valve replacement         Social History:  History   Substance Use Topics   . Smoking status: Current Every Day Smoker -- 0.50 packs/day   . Smokeless tobacco: Not on file   . Alcohol Use: Yes      Comment: rarely     History   Drug Use No              Family History:  No family history on file.    Medication History:  Current Outpatient Prescriptions   Medication Sig   . ciprofloxacin (CIPRO) 500 mg Oral Tablet Take 500 mg by mouth Twice daily   . levETIRAcetam  (KEPPRA ) 500 mg Oral Tablet Take 2,000 mg by mouth Twice daily   . OXcarbazepine  (TRILEPTAL ) 600 mg Oral Tablet Take 600 mg by mouth Twice daily   . tamsulosin  (FLOMAX ) 0.4 mg Oral Capsule, Sust. Release 24 hr Take 0.4 mg by mouth Every evening after dinner   . warfarin (COUMADIN) 10 mg Oral Tablet Take 10 mg by mouth QPM       Allergies:  No Known Allergies    Physical Exam     Vitals:    BP 120/75  Pulse 55  Temp(Src) 36.6 C (97.9 F)  Resp 20  Ht 1.829 m (6' 0.01)  Wt 92.987 kg (205 lb)  BMI 27.8 kg/m2  SpO2 97%    Physical  Exam   Nursing note and vitals reviewed.  Constitutional: He is oriented to person, place, and time. He appears well-developed and well-nourished. He appears distressed.   HENT:   Head: Normocephalic and atraumatic.   Mouth/Throat: Oropharynx is clear and moist.   Eyes: Conjunctivae are normal. Pupils are equal, round, and reactive to light. No scleral icterus.   Neck: Normal range of motion. Neck supple.   Cardiovascular: Normal rate, regular rhythm and normal heart sounds.    Pulmonary/Chest: Effort normal and breath sounds normal. No respiratory distress.   Abdominal: Soft. Bowel sounds are normal. He exhibits no distension. There is no hepatosplenomegaly. There is no tenderness. There is no rebound, no guarding and no CVA tenderness.   Rectal exam performed with RN in room.  + frank red blood noted.  hemocult sent to lab.  No rebound tenderness on exam  + bowel sounds x 4 quadrants    Neurological: He is alert and oriented to person, place, and time. No cranial nerve deficit.   Skin: Skin is warm and dry.   Psychiatric: He has a normal mood and affect. His behavior is normal.       Diagnostic Studies/Treatment     Medications:  Medications   NS flush syringe (not  administered)   NS premix infusion ( Intravenous New Bag/New Syringe 09/04/12 2034)   NS bolus infusion 1,000 mL (0 mL Intravenous Stopped 09/04/12 1938)   phytonadione  (MEPHYTON ) tablet (2.5 mg Oral Given 09/04/12 1926)   ioversol  (OPTIRAY  350) infusion (100 mL Intravenous Given 09/04/12 2021)   diatrizoate  meglumine  & sodium (MD-GASTROVIEW ) oral solution (30 mL Oral Given 09/04/12 1830)       New Prescriptions    No medications on file       Labs:    Results for orders placed during the hospital encounter of 09/04/12   OCCULT BLOOD, STOOL       Result Value Range    STOOL OCCULT BLOOD, PATIENT POSITIVE (*) NEGATIVE   CBC       Result Value Range    WBC 5.2  4.0 - 11.0 K/uL    RBC 3.93 (*) 4.5 - 6.0 M/uL    HGB 12.1 (*) 13.7 - 18.0 g/dL    HCT 64.2 (*) 59.9 - 54.0 %    MCV 90.9 (*) 82.0 - 100.0 fL    MCH 30.9  28.3 - 34.3 pg    MCHC 34.0  32.0 - 36.0 g/dL    RDW 85.9  88.9 - 83.9 %    PLATELET COUNT 169  150 - 400 K/uL    MPV 8.6  7.4 - 10.45 fL    NRBC 0  0 - 0.6 /100 WBC    NRBC ABSOLUTE 0.00  0 - 0.02 K/uL    PMN % 58.5  43.0 - 76.0 %    LYMPHOCYTE % 28.6  15.0 - 43.0 %    MONOCYTE % 10.7  4.8 - 12.0 %    EOSINOPHIL % 1.2  0.0 - 5.2 %    BASOPHILS % 1.0  0 - 2.50 %    PMN # 3.00  1.5 - 6.5 K/uL    LYMPHOCYTE # 1.50  0.7 - 3.20 K/uL    MONOCYTE # 0.60  0.20 - 0.90 K/uL    EOSINOPHIL # 0.10  0.00 - 0.50 K/uL    BASOPHIL # 0.10  0.0 - 0.10 K/uL   COMPREHENSIVE METABOLIC PROFILE - [facility] ONLY  Result Value Range    GLUCOSE 88  70 - 110 mg/dL    BUN 20  6 - 20 mg/dL    CREATININE 9.04  9.38 - 1.24 mg/dL    ESTIMATED GLOMERULAR FILTRATION RATE >60  >60 ml/min    SODIUM 129 (*) 136 - 145 mmol/L    POTASSIUM 4.3  3.4 - 5.1 mmol/L    CHLORIDE 93 (*) 101 - 111 mmol/L    CARBON DIOXIDE 29  22 - 32 mmol/L    ANION GAP 7  3 - 11 mmol/L    CALCIUM  8.7 (*) 8.8 - 10.2 mg/dL    TOTAL PROTEIN 6.4  6.4 - 8.3 g/dL    ALBUMIN 3.6  3.5 - 5.0 g/dL    ALBUMIN/GLOBULIN RATIO 1.2      BILIRUBIN, TOTAL 1.3 (*) 0.3 - 1.2 mg/dL    AST  (SGOT) 45 (*) 15 - 41 IU/L    ALT (SGPT) 30  17 - 63 IU/L    ALKALINE PHOSPHATASE 88  38 - 126 IU/L   PT/INR       Result Value Range    PROTHROMBIN TIME 64.9 (*) 9.8 - 11.1 sec    INR NORMALIZED 5.85 (*)    LIPASE       Result Value Range    LIPASE 30  22 - 51 U/L   TYPE AND SCREEN - [facility] ONLY       Result Value Range    ABO/RH(D) A POSITIVE      ANTIBODY SCREEN NEGATIVE         Radiology:  OCCULT BLOOD, STOOL  CT ABDOMEN PELVIS W IV CONTRAST    CT ABDOMEN PELVIS W IV CONTRAST    (Results Pending)           Procedure     Procedures    Course/Disposition/Plan     Course:    Discussed care with attending who agrees with treatment plan  Contacted [facility] for admission.    Disposition:   Admitted    Follow up:   No follow-up provider specified.    Clinical Impression:     Encounter Diagnoses   Name Primary?   . Bright red rectal bleeding Yes   . Abnormal INR    . Hyponatremia        Future Appointments Scheduled in Epic:  No future appointments.

## 2012-09-04 NOTE — ED Nurses Note (Signed)
 [facility] at bedside tfor possible admission

## 2012-09-04 NOTE — ED Nurses Note (Signed)
Pt report blood in toilet after BM. History of hemorrhoids. Denies other symptoms. Pt on coumadin.

## 2012-09-04 NOTE — ED Nurses Note (Signed)
 Pt drinking oral contrast  Awaiting lab results and ct

## 2012-09-04 NOTE — ED Nurses Note (Signed)
Pt. Back from radiology

## 2012-09-05 DIAGNOSIS — Z952 Presence of prosthetic heart valve: Secondary | ICD-10-CM

## 2012-09-05 DIAGNOSIS — K625 Hemorrhage of anus and rectum: Secondary | ICD-10-CM

## 2012-09-05 LAB — CBC
BASOPHIL #: 0.1 10*3/uL (ref 0.0–0.10)
BASOPHILS %: 2 % (ref 0–2.50)
EOSINOPHIL #: 0.1 10*3/uL (ref 0.00–0.50)
EOSINOPHIL %: 2.4 % (ref 0.0–5.2)
HCT: 33.8 % — ABNORMAL LOW (ref 40.0–54.0)
HGB: 11.4 g/dL — ABNORMAL LOW (ref 13.7–18.0)
LYMPHOCYTE #: 1 10*3/uL (ref 0.7–3.20)
LYMPHOCYTE %: 24.4 % (ref 15.0–43.0)
MCH: 31.2 pg (ref 28.3–34.3)
MCHC: 33.8 g/dL (ref 32.0–36.0)
MCV: 92.5 fL (ref 82.0–100.0)
MONOCYTE #: 0.6 10*3/uL (ref 0.20–0.90)
MONOCYTE %: 13.7 % — ABNORMAL HIGH (ref 4.8–12.0)
MPV: 8.9 fL (ref 7.4–10.45)
NRBC ABSOLUTE: 0 K/uL (ref 0–0.02)
NRBC: 0 /100 WBC (ref 0–0.6)
PLATELET COUNT: 152 K/uL (ref 150–400)
PMN #: 2.4 10*3/uL (ref 1.5–6.5)
PMN %: 57.5 % (ref 43.0–76.0)
RBC: 3.65 M/uL — ABNORMAL LOW (ref 4.5–6.0)
RDW: 14 % (ref 11.0–16.0)
WBC: 4.2 10*3/uL (ref 4.0–11.0)

## 2012-09-05 LAB — TYPE AND SCREEN - BMC/JMC ONLY
ABO/RH(D): A POS
ANTIBODY SCREEN: NEGATIVE

## 2012-09-05 LAB — PT/INR
INR NORMALIZED: 7.16
PROTHROMBIN TIME: 79.9 s (ref 9.8–11.1)

## 2012-09-05 MED ORDER — PHYTONADIONE (VITAMIN K1) 5 MG TABLET
2.5000 mg | ORAL_TABLET | Freq: Once | ORAL | Status: DC
Start: 2012-09-05 — End: 2012-09-05

## 2012-09-05 MED ORDER — PHYTONADIONE (VITAMIN K1) 5 MG TABLET
2.5000 mg | ORAL_TABLET | Freq: Once | ORAL | Status: AC
Start: 2012-09-05 — End: 2012-09-05
  Administered 2012-09-05: 2.5 mg via ORAL
  Filled 2012-09-05: qty 1

## 2012-09-05 MED ORDER — LEVETIRACETAM 250 MG TABLET
1000.00 mg | ORAL_TABLET | Freq: Two times a day (BID) | ORAL | Status: DC
Start: 2012-09-05 — End: 2012-09-06
  Administered 2012-09-05 – 2012-09-06 (×2): 1000 mg via ORAL
  Filled 2012-09-05: qty 4

## 2012-09-05 MED ADMIN — sodium chloride 0.9 % (flush) injection syringe: 0 mL | INTRAVENOUS

## 2012-09-05 MED ADMIN — tamsulosin 0.4 mg capsule: 0.4 mg | ORAL | NDC 68084040711

## 2012-09-05 MED ADMIN — sodium chloride 0.9 % (flush) injection syringe: 2.5 mL | INTRAVENOUS | NDC 09999086410

## 2012-09-05 MED ADMIN — acetaminophen 325 mg tablet: 650 mg | ORAL | NDC 50580050130

## 2012-09-05 NOTE — Progress Notes (Addendum)
Ruben Ruben  FAMILY MEDICINE PROGRESS NOTE      Ruben Ruben, 62 y.o. male  Date of Admission:  09/04/2012  Date of service: 09/05/2012  Date of Birth:  [birth year]    Ruben Ruben:  LOS: 1 [name]     Subjective: Patient feeling okay. Denies lightheadedness or dizziness. Tolerating PO well. Bloody stool has mostly resolved. Denies abdominal pain.    Vital Signs:  Temperature: 36.6 C (97.9 F)  Heart Rate: 57  BP (Non-Invasive): 131/63 mmHg  Respiratory Rate: 16  SpO2-1: 100 %  Pain Score (Numeric, Faces): 0    Intake & Output:    Intake/Output Summary (Last 24 hours) at 09/05/12 1657  Last data filed at 09/05/12 1257   Gross per 24 hour   Intake    480 ml   Output   1200 ml   Net   -720 ml     I/O current shift:     Emesis:    BM:    Last Bowel Movement: 09/05/12  Heme:      Current Medications:    Current Facility-Administered Medications:  acetaminophen (TYLENOL) tablet 650 mg Oral Q4H PRN   levETIRAcetam (KEPPRA) tablet 1,000 mg Oral 2x/[name]   NS flush syringe 2.5 mL Intravenous Q8HRS   OXcarbazepine (TRILEPTAL) tablet 600 mg Oral 2x/[name]   tamsulosin (FLOMAX) capsule 0.4 mg Oral Daily after Dinner       No Known Allergies    Today's Physical Exam:  General: appears chronically ill, appears stated age and no distress   HEENT:   Head: normocephalic, atraumatic, normal facial symmetry   Ears: normal external appearance   Eyes: Conjunctiva clear., Pupils equal and round. , Sclera non-icteric.   Nose: Normal external appearance   Throat/Mouth: MMM. No lesions. Poor dentition.  Neck: No JVD or thyromegaly or lymphadenopathy   Lungs: clear to auscultation bilaterally.   Heart: regular rate and rhythm, S1, S2 normal, mechanical click noted.   Abdomen: soft, bowel sounds normal. Non-distended. No tenderness to palpation of all 4 quadrants   Extremities: extremities normal, atraumatic, no cyanosis or edema   Skin: Skin warm and dry, No rashes and No lesions. Normal skin turgor.    Neurologic: CN II - XII grossly intact.   Psychiatric: normal affect, behavior, memory, thought content, judgement, and speech.    Labs:  Lab Results for Last 24 Hours:    Results for orders placed during the Ruben encounter of 09/04/12 (from the past 24 hour(s))   OCCULT BLOOD, STOOL       Result Value Range    STOOL OCCULT BLOOD, PATIENT POSITIVE (*) NEGATIVE   TYPE AND SCREEN - [facility] ONLY       Result Value Range    ABO/RH(D) A POSITIVE      ANTIBODY SCREEN NEGATIVE     CBC       Result Value Range    WBC 5.2  4.0 - 11.0 K/uL    RBC 3.93 (*) 4.5 - 6.0 M/uL    HGB 12.1 (*) 13.7 - 18.0 g/dL    HCT 40.9 (*) 81.1 - 54.0 %    MCV 90.9 (*) 82.0 - 100.0 fL    MCH 30.9  28.3 - 34.3 pg    MCHC 34.0  32.0 - 36.0 g/dL    RDW 91.4  78.2 - 95.6 %    PLATELET COUNT 169  150 - 400 K/uL    MPV 8.6  7.4 - 10.45 fL  NRBC 0  0 - 0.6 /100 WBC    NRBC ABSOLUTE 0.00  0 - 0.02 K/uL    PMN % 58.5  43.0 - 76.0 %    LYMPHOCYTE % 28.6  15.0 - 43.0 %    MONOCYTE % 10.7  4.8 - 12.0 %    EOSINOPHIL % 1.2  0.0 - 5.2 %    BASOPHILS % 1.0  0 - 2.50 %    PMN # 3.00  1.5 - 6.5 K/uL    LYMPHOCYTE # 1.50  0.7 - 3.20 K/uL    MONOCYTE # 0.60  0.20 - 0.90 K/uL    EOSINOPHIL # 0.10  0.00 - 0.50 K/uL    BASOPHIL # 0.10  0.0 - 0.10 K/uL   COMPREHENSIVE METABOLIC PROFILE - [facility] ONLY       Result Value Range    GLUCOSE 88  70 - 110 mg/dL    BUN 20  6 - 20 mg/dL    CREATININE 8.41  3.24 - 1.24 mg/dL    ESTIMATED GLOMERULAR FILTRATION RATE >60  >60 ml/min    SODIUM 129 (*) 136 - 145 mmol/L    POTASSIUM 4.3  3.4 - 5.1 mmol/L    CHLORIDE 93 (*) 101 - 111 mmol/L    CARBON DIOXIDE 29  22 - 32 mmol/L    ANION GAP 7  3 - 11 mmol/L    CALCIUM 8.7 (*) 8.8 - 10.2 mg/dL    TOTAL PROTEIN 6.4  6.4 - 8.3 g/dL    ALBUMIN 3.6  3.5 - 5.0 g/dL    ALBUMIN/GLOBULIN RATIO 1.2      BILIRUBIN, TOTAL 1.3 (*) 0.3 - 1.2 mg/dL    AST (SGOT) 45 (*) 15 - 41 IU/L    ALT (SGPT) 30  17 - 63 IU/L    ALKALINE PHOSPHATASE 88  38 - 126 IU/L   PT/INR       Result Value Range     PROTHROMBIN TIME 64.9 (*) 9.8 - 11.1 sec    INR NORMALIZED 5.85 (*)    LIPASE       Result Value Range    LIPASE 30  22 - 51 U/L   CBC       Result Value Range    WBC 4.2  4.0 - 11.0 K/uL    RBC 3.65 (*) 4.5 - 6.0 M/uL    HGB 11.4 (*) 13.7 - 18.0 g/dL    HCT 40.1 (*) 02.7 - 54.0 %    MCV 92.5  82.0 - 100.0 fL    MCH 31.2  28.3 - 34.3 pg    MCHC 33.8  32.0 - 36.0 g/dL    RDW 25.3  66.4 - 40.3 %    PLATELET COUNT 152  150 - 400 K/uL    MPV 8.9  7.4 - 10.45 fL    NRBC 0  0 - 0.6 /100 WBC    NRBC ABSOLUTE 0.00  0 - 0.02 K/uL    PMN % 57.5  43.0 - 76.0 %    LYMPHOCYTE % 24.4  15.0 - 43.0 %    MONOCYTE % 13.7 (*) 4.8 - 12.0 %    EOSINOPHIL % 2.4  0.0 - 5.2 %    BASOPHILS % 2.0  0 - 2.50 %    PMN # 2.40  1.5 - 6.5 K/uL    LYMPHOCYTE # 1.00  0.7 - 3.20 K/uL    MONOCYTE # 0.60  0.20 - 0.90 K/uL  EOSINOPHIL # 0.10  0.00 - 0.50 K/uL    BASOPHIL # 0.10  0.0 - 0.10 K/uL   PT/INR       Result Value Range    PROTHROMBIN TIME 79.9 (*) 9.8 - 11.1 sec    INR NORMALIZED 7.16 (*)        Current Diet Order:       Prophylaxis:    DVT/PE  SCDs/ Venodynes    GI: Not indicated      Assessment  Active Ruben Problems    Diagnosis   . Primary Problem: Bleeding per rectum   . Supratherapeutic INR     Ruben Ruben is a 62 y.o. male with GI bleeding.  #1. GI bleed  - relsolving.   - good vital signs and stable H/H   - Surgery consult canceled. If he stays stable, may follow up as outpatient.   #2. Hyponatremia   - s/p IV hydration with D5NS. Hep lock IV and will encourage oral intake.  --Recheck BMP in the morning   #3. Supratherapeutic INR   -- Coumadin held.  -- INR 5.85 -> 7.16. Will give Vitamin K 2.5 mg x 1. With h/o mechanical aortic valve, will be judicious with vit K.  -- Recheck INR in the morning   Stable Medical Conditions   1. Seizure disorder--continue Keppra 1000 mg BID and trileptal 600 mg BID. Not taking any additional medications from seizure study.   2. Mechanical aortic valve.   3. Hepatitis C     Disposition Planning: Home discharge  after INR and GI bleed stabilize.      Ruben More, MD 09/05/2012, 4:57 PM  I have seen and examined the patient.  I discussed management with the resident. I reviewed the resident's note and agree with the documented findings and plan of care.    Additional notes  No additional bleeding today but with INR incr we will give additional vit k and keep overnite for follow for precaution of rebleed.    Lupe Carney, MD

## 2012-09-05 NOTE — Nurses Notes (Signed)
Received patient from ED instable condition, patient ambulated to bathroom from stretcher to void and back to bed. Bedside report received from ED nurse Amy. Saline lock to LAC, patent. Patient alert and oriented. Patient oriented to call bell, bed controls and room.  SCD's placed on patient. Patient made NPO. No complaints voiced at present.

## 2012-09-06 LAB — CBC
BASOPHIL #: 0 10*3/uL (ref 0.0–0.10)
BASOPHILS %: 0.6 % (ref 0–2.50)
EOSINOPHIL #: 0.1 10*3/uL (ref 0.00–0.50)
EOSINOPHIL %: 1.8 % (ref 0.0–5.2)
HCT: 35.2 % — ABNORMAL LOW (ref 40.0–54.0)
HGB: 11.9 g/dL — ABNORMAL LOW (ref 13.7–18.0)
LYMPHOCYTE #: 1.2 10*3/uL (ref 0.7–3.20)
LYMPHOCYTE %: 21 % (ref 15.0–43.0)
MCH: 30.9 pg (ref 28.3–34.3)
MCHC: 33.9 g/dL (ref 32.0–36.0)
MCV: 91.3 fL (ref 82.0–100.0)
MONOCYTE #: 0.6 10*3/uL (ref 0.20–0.90)
MONOCYTE %: 10.6 % (ref 4.8–12.0)
MPV: 9 fL (ref 7.4–10.45)
NRBC ABSOLUTE: 0 10*3/uL (ref 0–0.02)
NRBC: 0 /100 WBC (ref 0–0.6)
PLATELET COUNT: 149 10*3/uL — ABNORMAL LOW (ref 150–400)
PMN #: 3.6 10*3/uL (ref 1.5–6.5)
PMN %: 66 % (ref 43.0–76.0)
RBC: 3.86 M/uL — ABNORMAL LOW (ref 4.5–6.0)
RDW: 13.9 % (ref 11.0–16.0)
WBC: 5.5 10*3/uL (ref 4.0–11.0)

## 2012-09-06 LAB — PT/INR
INR NORMALIZED: 2.45
PROTHROMBIN TIME: 26.4 s (ref 9.8–11.1)

## 2012-09-06 MED ORDER — WARFARIN 7.5 MG TABLET
7.50 mg | ORAL_TABLET | Freq: Every evening | ORAL | Status: DC
Start: 2012-09-06 — End: 2012-09-21

## 2012-09-06 MED ADMIN — sodium chloride 0.9 % (flush) injection syringe: 2.5 mL | INTRAVENOUS | NDC 09999086410

## 2012-09-06 MED ADMIN — acetaminophen 325 mg tablet: 650 mg | ORAL | NDC 50580050130

## 2012-09-06 NOTE — Progress Notes (Addendum)
[facility]  FAMILY MEDICINE PROGRESS NOTE      [name],[name], 62 y.o. male  Date of Admission:  09/04/2012  Date of service: 09/06/2012  Date of Birth:  February 16, 1951    Hospital Day:  LOS: 2 days     Subjective: Doing well. Has been ambulating well without lightheadedness or dizziness. BM x 1 overnight. Loose without blood.    Vital Signs:  Temperature: 36.7 C (98.1 F)  Heart Rate: 52  BP (Non-Invasive): 123/76 mmHg  Respiratory Rate: 20  SpO2-1: 97 %  Pain Score (Numeric, Faces): 1    Intake & Output:    Intake/Output Summary (Last 24 hours) at 09/06/12 3244  Last data filed at 09/06/12 0102   Gross per 24 hour   Intake    840 ml   Output   1725 ml   Net   -885 ml     I/O current shift:  05/25 0800 - 05/25 1559  In: 360 [P.O.:360]  Out: -   Emesis:    BM:    Last Bowel Movement: 09/05/12  Heme:      Current Medications:    Current Facility-Administered Medications:  acetaminophen (TYLENOL) tablet 650 mg Oral Q4H PRN   levETIRAcetam (KEPPRA) tablet 1,000 mg Oral 2x/day   NS flush syringe 2.5 mL Intravenous Q8HRS   OXcarbazepine (TRILEPTAL) tablet 600 mg Oral 2x/day   tamsulosin (FLOMAX) capsule 0.4 mg Oral Daily after Dinner       No Known Allergies    Today's Physical Exam:  General: appears chronically ill, appears stated age and no distress   HEENT:   Head: normocephalic, atraumatic, normal facial symmetry   Ears: normal external appearance   Eyes: Conjunctiva clear., Pupils equal and round. , Sclera non-icteric.   Nose: Normal external appearance   Throat/Mouth: MMM. No lesions. Poor dentition.  Neck: No JVD or thyromegaly or lymphadenopathy   Lungs: clear to auscultation bilaterally.   Heart: regular rate and rhythm, S1, S2 normal, mechanical click noted.   Abdomen: soft, bowel sounds normal. Non-distended. No tenderness to palpation of all 4 quadrants   Extremities: extremities normal, atraumatic, no cyanosis or edema    Skin: Skin warm and dry, No rashes and No lesions. Normal skin turgor.   Neurologic: CN II - XII grossly intact.   Psychiatric: normal affect, behavior, memory, thought content, judgement, and speech.    Labs:  Lab Results for Last 24 Hours:    Results for orders placed during the hospital encounter of 09/04/12 (from the past 24 hour(s))   CBC       Result Value Range    WBC 5.5  4.0 - 11.0 K/uL    RBC 3.86 (*) 4.5 - 6.0 M/uL    HGB 11.9 (*) 13.7 - 18.0 g/dL    HCT 72.5 (*) 36.6 - 54.0 %    MCV 91.3  82.0 - 100.0 fL    MCH 30.9  28.3 - 34.3 pg    MCHC 33.9  32.0 - 36.0 g/dL    RDW 44.0  34.7 - 42.5 %    PLATELET COUNT 149 (*) 150 - 400 K/uL    MPV 9.0  7.4 - 10.45 fL    NRBC 0  0 - 0.6 /100 WBC    NRBC ABSOLUTE 0.00  0 - 0.02 K/uL    PMN % 66.0  43.0 - 76.0 %    LYMPHOCYTE % 21.0  15.0 - 43.0 %    MONOCYTE %  10.6  4.8 - 12.0 %    EOSINOPHIL % 1.8  0.0 - 5.2 %    BASOPHILS % 0.6  0 - 2.50 %    PMN # 3.60  1.5 - 6.5 K/uL    LYMPHOCYTE # 1.20  0.7 - 3.20 K/uL    MONOCYTE # 0.60  0.20 - 0.90 K/uL    EOSINOPHIL # 0.10  0.00 - 0.50 K/uL    BASOPHIL # 0.00  0.0 - 0.10 K/uL   PT/INR       Result Value Range    PROTHROMBIN TIME 26.4 (*) 9.8 - 11.1 sec    INR NORMALIZED 2.45         Current Diet Order:       Prophylaxis:    DVT/PE  SCDs/ Venodynes    GI: Not indicated      Assessment  Active Hospital Problems    Diagnosis   . Primary Problem: Bleeding per rectum   . Supratherapeutic INR   . Mechanical heart valve present     [name] is a 62 y.o. male with GI bleeding.  1. GI bleed  - relsolved  - good vital signs and stable H/H   - may need follow up with colonoscopy as outpatient.    2. Supratherapeutic INR   - INR 7.16 -> 2.45. S/p vit K 2.5 x 1 yesterday.  - restart coumadin at 7.5 mg daily. Follow up INR as outpatient.    Stable Medical Conditions   1. Seizure disorder--continue Keppra 1000 mg BID and trileptal 600 mg BID. Not taking any additional medications from seizure study.   2. Mechanical aortic valve.    3. Hepatitis C    Disposition Planning: Home discharge  after INR and GI bleed stabilize.      Caroline More, MD 09/06/2012, 9:17 AM  I have seen and examined the patient.  I discussed management with the resident. I reviewed the resident's note and agree with the documented findings and plan of care.    Additional notes    INR returned to therapeutic range.  No further bleeding.  Will dc home and f/u INR 7 days on dose of 7.5 daily which had been his dose for several years      Lupe Carney, MD

## 2012-09-06 NOTE — Discharge Summary (Addendum)
[facility]   [address]  [place], [place]  [address]  DISCHARGE SUMMARY      PATIENT NAME:  [name]  MRN:  [id number]  DOB:  1950/07/28    ADMISSION DATE:  09/04/2012  DISCHARGE DATE:  09/06/2012    ATTENDING PHYSICIAN: Lupe Carney, MD  PRIMARY CARE PHYSICIAN: Outside Medical Doctor     ADMISSION DIAGNOSIS: Bleeding per rectum  Chief Complaint   Patient presents with   . Blood In  Stool     Reports blood in stool since last pm.        DISCHARGE DIAGNOSIS:   Active Hospital Problems    Diagnosis Date Noted   . Principle Problem: Bleeding per rectum 09/05/2012   . Supratherapeutic INR 09/05/2012   . Mechanical heart valve present 09/05/2012      Resolved Hospital Problems    Diagnosis    No resolved problems to display.     Active Non-Hospital Problems    Diagnosis Date Noted   . Leukocytosis 08/05/2011   . Acute renal failure 08/05/2011   . Kidney stone 08/04/2011        DISCHARGE MEDICATIONS:  Current Discharge Medication List      CONTINUE these medications which have NOT CHANGED    Details   ciprofloxacin (CIPRO) 500 mg Oral Tablet Take 500 mg by mouth Twice daily      levETIRAcetam (KEPPRA) 500 mg Oral Tablet Take 2,000 mg by mouth Twice daily      OXcarbazepine (TRILEPTAL) 600 mg Oral Tablet Take 600 mg by mouth Twice daily      tamsulosin (FLOMAX) 0.4 mg Oral Capsule, Sust. Release 24 hr Take 0.4 mg by mouth Every evening after dinner      warfarin (COUMADIN) 10 mg Oral Tablet Take 10 mg by mouth QPM         STOP taking these medications       hydrocodone-Acetaminophen (NORCO) 5-325 mg Oral Tablet tablet Comments:   Reason for Stopping:         ondansetron (ZOFRAN ODT) 4 mg Oral Tablet, Rapid Dissolve Comments:   Reason for Stopping:         Topiramate (TOPAMAX) 200 mg Oral Tablet Comments:   Reason for Stopping:               DISCHARGE INSTRUCTIONS:   No discharge procedures on file.     REASON FOR HOSPITALIZATION AND HOSPITAL COURSE:  This is a 62 y.o., male with PMH of diverticular disease s/p resection presenting with acute onset BRBPR.  Found with INR of 5.8 on admission. Had been on coumadin 7.5mg  daily for about 10 years after aortic mechanical valve placement. Coumadin dose was increased to 10 mg daily about 3-4 months ago and he hasn't been able to check his INR for the last 2 months. While in the hospital, his GI bleed has resolved and his vital signs and H/H have stayed stable. INR has decreased to 2.45 and he would resume coumadin at 7.5mg  daily. Will recheck INR this week at [facility].        SIGNIFICANT LAB:   Lab Results for Last 24 Hours:    Results for orders placed during the hospital encounter of 09/04/12 (from the past 24 hour(s))   CBC       Result Value Range    WBC 5.5  4.0 - 11.0 K/uL    RBC 3.86 (*) 4.5 - 6.0 M/uL    HGB  11.9 (*) 13.7 - 18.0 g/dL    HCT 40.9 (*) 81.1 - 54.0 %    MCV 91.3  82.0 - 100.0 fL    MCH 30.9  28.3 - 34.3 pg    MCHC 33.9  32.0 - 36.0 g/dL    RDW 91.4  78.2 - 95.6 %    PLATELET COUNT 149 (*) 150 - 400 K/uL    MPV 9.0  7.4 - 10.45 fL    NRBC 0  0 - 0.6 /100 WBC    NRBC ABSOLUTE 0.00  0 - 0.02 K/uL    PMN % 66.0  43.0 - 76.0 %    LYMPHOCYTE % 21.0  15.0 - 43.0 %    MONOCYTE % 10.6  4.8 - 12.0 %    EOSINOPHIL % 1.8  0.0 - 5.2 %    BASOPHILS % 0.6  0 - 2.50 %    PMN # 3.60  1.5 - 6.5 K/uL    LYMPHOCYTE # 1.20  0.7 - 3.20 K/uL    MONOCYTE # 0.60  0.20 - 0.90 K/uL    EOSINOPHIL # 0.10  0.00 - 0.50 K/uL    BASOPHIL # 0.00  0.0 - 0.10 K/uL   PT/INR       Result Value Range    PROTHROMBIN TIME 26.4 (*) 9.8 - 11.1 sec    INR NORMALIZED 2.45             DOES PATIENT HAVE ADVANCED DIRECTIVES:  No, Information Offered and Refused    CONDITION ON DISCHARGE: Alert, Oriented and VS Stable    DISCHARGE DISPOSITION:  Home discharge     cc: Primary Care Physician:  Outside Medical Doctor  No address on file     [id number] Physician:   No referring provider defined for this encounter.     Caroline More, MD 09/06/2012 12:52 PM      I have seen and examined the patient.  I discussed management with the resident. I reviewed the resident's note and agree with the documented findings and plan of care.    Additional notes    Agree w note.  Restart coumadin lower dose and f/u 7 days.  Bleeding resolved.  Has had recent c-scope      Lupe Carney, MD

## 2012-09-15 ENCOUNTER — Other Ambulatory Visit (INDEPENDENT_AMBULATORY_CARE_PROVIDER_SITE_OTHER): Payer: Self-pay

## 2012-09-16 ENCOUNTER — Ambulatory Visit
Admission: RE | Admit: 2012-09-16 | Discharge: 2012-09-16 | Disposition: A | Discharge status: Home / Self Care | Payer: Managed Care, Other (non HMO) | Source: Ambulatory Visit

## 2012-09-16 DIAGNOSIS — Z7901 Long term (current) use of anticoagulants: Secondary | ICD-10-CM | POA: Insufficient documentation

## 2012-09-16 LAB — PROTHROMBIN TIME, THERAPEUTIC
COUMADIN-LAST DOSE DATE: 6032014
INR: 1.99
PROTHROMBIN TIME: 21.3 s (ref 9.8–11.1)

## 2012-09-21 MED ORDER — WARFARIN 7.5 MG TABLET
7.5000 mg | ORAL_TABLET | Freq: Every evening | ORAL | Status: DC
Start: 2012-09-21 — End: 2012-10-05

## 2012-09-29 ENCOUNTER — Encounter (INDEPENDENT_AMBULATORY_CARE_PROVIDER_SITE_OTHER): Payer: Self-pay

## 2012-09-29 ENCOUNTER — Ambulatory Visit (INDEPENDENT_AMBULATORY_CARE_PROVIDER_SITE_OTHER): Payer: Managed Care, Other (non HMO)

## 2012-09-29 VITALS — BP 126/74 | HR 64 | Temp 98.0°F | Resp 16 | Ht 72.0 in | Wt 207.0 lb

## 2012-09-29 DIAGNOSIS — Z23 Encounter for immunization: Secondary | ICD-10-CM

## 2012-09-29 DIAGNOSIS — Z8719 Personal history of other diseases of the digestive system: Secondary | ICD-10-CM

## 2012-09-29 DIAGNOSIS — Z7901 Long term (current) use of anticoagulants: Secondary | ICD-10-CM

## 2012-09-29 DIAGNOSIS — R6889 Other general symptoms and signs: Secondary | ICD-10-CM

## 2012-09-29 DIAGNOSIS — G40909 Epilepsy, unspecified, not intractable, without status epilepticus: Secondary | ICD-10-CM

## 2012-09-29 DIAGNOSIS — Z954 Presence of other heart-valve replacement: Secondary | ICD-10-CM

## 2012-09-29 NOTE — Progress Notes (Signed)
BP 126/74   Pulse 64   Temp(Src) 36.7 C (98 F) (Oral)   Resp 16   Ht 1.829 m (6')   Wt 93.895 kg (207 lb)   BMI 28.07 kg/m2  [name] 09/29/2012, 3:45 PM

## 2012-09-29 NOTE — Progress Notes (Addendum)
[facility]  New Patient Visit    ID: [name] is a 62 y.o. male  DOB: Mar 06, 1951   Date of Service: 09/29/2012     Chief Concern:   Chief Complaint   Patient presents with   . Lab Results        History of present illness:   Patient here to establish new care. Has history of mechanical heart valve replacement in 2003 requiring chronic anticoagulation with warfarin, epilepsy, and kidney stone. Was recently admitted to [facility] with episode of GI bleeding which was likely due to increased INR. His GI bleeding resolved quickly and his INR normalized before his discharge. Was started back on warfarin of 7.5 mg daily. No more GI bleed since. His repeat INR in 10 days was 1.99. Follows Dr. Allena Katz, a cardiologist in [place] and also follows [facility] for epilepsy. Says he has had problem with epilepsy for over thirty years. Currently on Keppra and trileptal, and last seizure was in Feb, 2014.      Past Medical History:   Patient Active Problem List    Diagnosis Date Noted   . Bleeding per rectum 09/05/2012   . Supratherapeutic INR 09/05/2012   . Mechanical heart valve present 09/05/2012     Aortic valve replacement done in 2003 for bicuspid valve.     . Leukocytosis 08/05/2011   . Acute renal failure 08/05/2011   . Kidney stone 08/04/2011       Past Surgical History:   Past Surgical History   Procedure Laterality Date   . Hx knee replacment     . Hx gall bladder surgery/chole     . Hx aortic valve replacement     . Hx colectomy  2010     at [facility] for diverticulitis.        Medications:   Current Outpatient Prescriptions   Medication Sig   . levETIRAcetam (KEPPRA) 500 mg Oral Tablet Take 2,000 mg by mouth Twice daily   . OXcarbazepine (TRILEPTAL) 600 mg Oral Tablet Take 600 mg by mouth Twice daily   . tamsulosin (FLOMAX) 0.4 mg Oral Capsule, Sust. Release 24 hr Take 0.4 mg by mouth Every evening after dinner    . warfarin (COUMADIN) 7.5 mg Oral Tablet Take 1 Tab (7.5 mg total) by mouth Every evening        Allergies:   No Known Allergies     Family History:   Family History   Problem Relation Age of Onset   . COPD Mother    . Heart Surgery Father      CABG   . Prostate Cancer Father 28   . Heart Disease Father         Social History:   History     Social History   . Marital Status: Married     Spouse Name: N/A     Number of Children: N/A   . Years of Education: N/A     Occupational History   . Not on file.     Social History Main Topics   . Smoking status: Current Every Day Smoker -- 0.50 packs/day   . Smokeless tobacco: Not on file   . Alcohol Use: Yes      Comment: rarely   . Drug Use: No   . Sexually Active: Not on file     Other Topics Concern   . Not on file     Social History Narrative   . No narrative on  file        Review of Systems:  Constitutional: no weight loss, fever, night sweats and feels well  Eyes: negative  ENT: negative  Cardiovascular: negative  Respiratory: negative  GI: negative for abdominal pain, nausea, vomiting and bloody stools  GU: negative  Musculoskeletal: negative  Neurological: h/o seizures  Emotional/Pscychiatric: negative  Skin: negative  Endocrine: negative  Hematologic/Lymphatic: negative     Physical Exam:  BP 126/74   Pulse 64   Temp(Src) 36.7 C (98 F) (Oral)   Resp 16   Ht 1.829 m (6')   Wt 93.895 kg (207 lb)   BMI 28.07 kg/m2     General: Pleasant 62 y.o., Patient is well nourished, well developed, appearing stated age and  normal affect  HEENT:    Head: Normocephalic. No masses, lesions, tenderness or abnormalities   Oropharynx: MMM no lesions  Neck: supple, no adenopathy  Heart: positive findings: RRR, normal S1, S2. 2/6 systolic murmur  Lungs: clear to auscultation and no wheezes or rales  Abdomen: normal, flat and normal bowel sounds  Musculoskeletal: no joint tenderness, deformity or swelling  Extremities: no edema   Skin: Normal color, texture and turgor without significant lesions or rashes  Neuro: negative  Mental Status: alert, oriented to person, place, and time       Health Maintenance:   Blood pressure screen: WNL                 Colonoscopy: 4 years ago                  Lipid screen: Doesn't remember.                   DM screen: WNL                        Tetanus: More than 10 years ago.      Assessment and Plan:   1. Need for tetanus booster    2. Warfarin anticoagulation    3. Mechanical heart valve present    4. Epilepsy    5. H/O: GI bleed    6. Abnormal TSH       Patient here to establish new care. Will check Lipid panel, TSH, and INR. Wants to follow with coumadin clinic here. Referral made for colonoscopy With h/o of recent GI bleeding and diverticulitis in the past (s/p partial colectomy 4 years ago).    Medication changes: none  Orders this visit: lipid panel, TSH, and INR, referral for colonoscopy.  Refills this visit: none    RTC in 1 week for coumadin check.    Caroline More, MD 09/29/2012, 4:55 PM       Visit Diagnoses and associated Orders -- Summary:       ICD-9-CM    1. Need for tetanus booster V03.7 TETANUS,DIPTHERIA,ACEL PERT, > OR EQUAL TO 62YRS OLD(ADACEL)(ADMIN)   2. Warfarin anticoagulation V58.61 PROTHROMBIN TIME, THERAPEUTIC - [facility] ONLY   3. Mechanical heart valve present V43.3 LIPID PANEL   4. Epilepsy 345.90    5. H/O: GI bleed V12.79 AMB CONSULT/REF [facility]   6. Abnormal TSH 796.4 TSH CASCADE,3RD GEN WITH REFLEX TO FT4 - [facility] ONLY

## 2012-09-29 NOTE — Progress Notes (Signed)
 I discussed the subjective and objective findings with the resident.   A/P   1. Hx of GI bleed   2. Anticoagulation - coumadin   3. Hx of valve replacement   4. Hx of seizure disorder  Recommend upper and lower endoscopy.  Check labs - INR, TSH, Chem and CBC.  He is followed in [place] for his cardiology follow-up. Needs monthly INR.  I agree with the assessment and plan  as written.

## 2012-10-05 ENCOUNTER — Other Ambulatory Visit (INDEPENDENT_AMBULATORY_CARE_PROVIDER_SITE_OTHER): Payer: Self-pay

## 2012-10-06 ENCOUNTER — Ambulatory Visit
Admission: RE | Admit: 2012-10-06 | Discharge: 2012-10-06 | Disposition: A | Discharge status: Home / Self Care | Payer: Managed Care, Other (non HMO) | Attending: Family Medicine | Admitting: Family Medicine

## 2012-10-06 DIAGNOSIS — Z7901 Long term (current) use of anticoagulants: Secondary | ICD-10-CM | POA: Insufficient documentation

## 2012-10-06 DIAGNOSIS — R7989 Other specified abnormal findings of blood chemistry: Secondary | ICD-10-CM | POA: Insufficient documentation

## 2012-10-06 DIAGNOSIS — Z954 Presence of other heart-valve replacement: Secondary | ICD-10-CM | POA: Insufficient documentation

## 2012-10-06 LAB — LIPID PANEL
CHOL/HDL RATIO: 2.5
CHOLESTEROL: 185 mg/dL (ref 120–199)
HDL-CHOLESTEROL: 73 mg/dL — ABNORMAL HIGH (ref 35–55)
LDL (CALCULATED): 92 mg/dL (ref <130)
TRIGLYCERIDES: 98 mg/dL (ref 40–160)
VLDL (CALCULATED): 20 mg/dL

## 2012-10-06 LAB — PROTHROMBIN TIME, THERAPEUTIC
COUMADIN DOSE: 7.5
COUMADIN-LAST DOSE DATE: 62314
INR: 2.09
PROTHROMBIN TIME: 22.5 s — ABNORMAL HIGH (ref 9.8–11.1)
TIME OF LAST DOSE: 2030

## 2012-10-06 LAB — TSH CASCADE,3RD GEN WITH REFLEX TO FT4: TSH CASCADE: 0.85 u[IU]/mL (ref 0.340–5.6)

## 2012-10-23 ENCOUNTER — Telehealth (INDEPENDENT_AMBULATORY_CARE_PROVIDER_SITE_OTHER): Payer: Self-pay

## 2012-10-23 NOTE — Telephone Encounter (Signed)
error 

## 2012-10-27 ENCOUNTER — Encounter (INDEPENDENT_AMBULATORY_CARE_PROVIDER_SITE_OTHER): Payer: Self-pay | Admitting: GENERAL SURGERY

## 2012-10-27 NOTE — Progress Notes (Signed)
Called pt lm I was returning his call.

## 2012-10-28 ENCOUNTER — Ambulatory Visit (INDEPENDENT_AMBULATORY_CARE_PROVIDER_SITE_OTHER): Payer: Managed Care, Other (non HMO) | Admitting: GENERAL SURGERY

## 2012-10-28 ENCOUNTER — Encounter (INDEPENDENT_AMBULATORY_CARE_PROVIDER_SITE_OTHER): Payer: Self-pay | Admitting: GENERAL SURGERY

## 2012-10-28 VITALS — BP 112/59 | HR 67 | Temp 98.3°F | Ht 72.0 in | Wt 205.5 lb

## 2012-10-28 NOTE — Patient Instructions (Signed)
Your colonoscopy is on 12/08/12.    Stop your coumadin 48 hours prior.    Be sure to take your anti-seizure meds the morning of the scope with only just enough water to get down.

## 2012-10-28 NOTE — Progress Notes (Signed)
Spoke with Sudie Grumbling at [facility] no authorization required for colonoscopy scheduled for 12/08/12.

## 2012-10-29 NOTE — Progress Notes (Signed)
See dictated note.    #[id number]

## 2012-10-30 NOTE — H&P (Signed)
[facility]                                       HISTORY AND PHYSICAL    PATIENT NAME: [name], [name]  HOSPITAL RUEAVW:[id number]  DATE OF SERVICE:10/28/2012  DATE OF BIRTH: [birth year]    PREOPERATIVE HISTORY AND PHYSICAL    CHIEF COMPLAINT:  Rectal bleeding.    HISTORY OF PRESENT ILLNESS:  This is a 62 year old man who has been having on and off rectal bleeding.  Last episode was over [facility] Day weekend.  The patient gives a history of having what sounds like a sigmoid colectomy 3 years ago for diverticular disease that was done laparoscopically.  Prior to that time, he had episodes of diverticulitis and since then has had no further episodes.    His rectal bleeding was significant over the Memorial Day weekend and it turned out that he was supertherapeutic from his Coumadin.  Review of the medical records reveals that the patient had been admitted on Sep 04, 2012, and was discharged 2 days later because of supratherapeutic INR.  He had associated leukocytosis and acute renal failure in the fairly recent time period.      PAST MEDICAL HISTORY:  As noted above.  The patient had aortic mechanical valve replacement for a prior valve.  Also, kidney stone removal, left knee replacement, and history of laparoscopic cholecystectomy.  Other significant medical issues include epilepsy, and it sounds sounds like the patient is soon scheduled to undergo a cerebral angiogram.  This is going to be done in early August.    MEDICATIONS:  Keppra, Trileptal, Flomax, Coumadin.    ALLERGIES:  No known drug allergies.    SOCIAL HISTORY:  Half a pack a day smoker.    FAMILY HISTORY:  The patient's father had colon cancer in his 17s.    REVIEW OF SYSTEMS:    Gastrointestinal:  No further bloody bowel movements, except for the one that he had over the Memorial Day weekend.    Genitourinary:  At times gets frequent urination.    Hematological:  Does get very easy bruising.   Neurological:  History of seizure disorder, memory loss, gait problems.    Psychiatric:  History of depression.    PHYSICAL EXAMINATION:  During this encounter, Delon Sacramento, LPN, was present.      APPEARANCE:  In no distress.    VITAL SIGNS:  Blood pressure 112/59, pulse 67 per minute, respirations 18, temperature 98.3 degrees Fahrenheit.    HEAD:  Appears normocephalic.    NECK:  Supple.  Airway adequate.    CHEST:  Symmetric.    HEART:  Regular and mechanical click is noted.    ABDOMEN:  Soft, without guarding or rebound.    NEUROLOGICAL:  No gross deficits.    MENTAL:  Normal affect.    SKIN:  No jaundice.    ASSESSMENT AND PLAN:  History of sigmoid colectomy for diverticular disease.  The patient had a fairly recent significant lower gastrointestinal bleed, most likely related to supertherapeutic INR.  It would be reasonable to perform a colonoscopy since the patient's last colonoscopy was more than 3 years ago and he has had no followup colonoscopy since his colectomy.     The patient is scheduled to undergo cerebral angiogram in early August and his Coumadin will be stopped according to the patient and his wife.  However, for his  colonoscopy which is scheduled for December 08, 2012, I do not see the reasons to stop such.  I discussed in detail with the patient and his wife the advantages and disadvantages and problems that could occur with stopping his Coumadin and then trying to restart it up even though he would be on a Lovenox bridge and because it would be so close in the vicinity to his angiogram that he will be undergoing in early August, I felt that it would not be necessary.  I explained to the patient and his wife that many people will do scope procedures and not stop Coumadin and that there is not necessarily an increased risk of bleeding issues.  Nevertheless, I did explain that there are still risks associated with this approach which can include bleeding and that measures to minimize this if a biopsy was to be done is liberal use of clips and/or injection of epinephrine.    Informed consent has been obtained.  Because he has also had a mechanical heart valve that has been placed, preoperative antibiotic within the hour of the procedure start has been ordered.      Sherilyn Banker, MD      [id number]; D: 10/29/2012 13:07:36 T: 10/30/2012 10:02:50

## 2012-11-03 ENCOUNTER — Ambulatory Visit (INDEPENDENT_AMBULATORY_CARE_PROVIDER_SITE_OTHER): Payer: Managed Care, Other (non HMO)

## 2012-11-03 VITALS — BP 140/80 | HR 76 | Temp 98.0°F | Resp 16 | Ht 72.0 in | Wt 212.0 lb

## 2012-11-03 DIAGNOSIS — Z8719 Personal history of other diseases of the digestive system: Secondary | ICD-10-CM

## 2012-11-03 DIAGNOSIS — G40909 Epilepsy, unspecified, not intractable, without status epilepticus: Secondary | ICD-10-CM | POA: Insufficient documentation

## 2012-11-03 DIAGNOSIS — N200 Calculus of kidney: Secondary | ICD-10-CM

## 2012-11-03 DIAGNOSIS — K625 Hemorrhage of anus and rectum: Secondary | ICD-10-CM

## 2012-11-03 DIAGNOSIS — Z7901 Long term (current) use of anticoagulants: Secondary | ICD-10-CM

## 2012-11-03 DIAGNOSIS — Z5181 Encounter for therapeutic drug level monitoring: Secondary | ICD-10-CM

## 2012-11-03 DIAGNOSIS — Q231 Congenital insufficiency of aortic valve: Secondary | ICD-10-CM

## 2012-11-03 HISTORY — DX: Personal history of other diseases of the digestive system: Z87.19

## 2012-11-03 LAB — POCT EAST PROTHROMBIN TIME (AMB): INR: 2.5

## 2012-11-03 MED ORDER — ENOXAPARIN 100 MG/ML SUBCUTANEOUS SYRINGE
1.00 mg/kg | INJECTION | Freq: Two times a day (BID) | SUBCUTANEOUS | Status: AC
Start: 2012-11-03 — End: 2012-11-13

## 2012-11-03 NOTE — Patient Instructions (Signed)
Stop coumadin on 11/14/12 until the day of your Wada test. Start Lovenox 100 mg twice a day on 11/14/12. Hold your Lovenox on the night before your wada test and resume it after procedure. Resume coumadin after procedure and continue taking your Lovenox until your INR becomes therapeutic.

## 2012-11-03 NOTE — Progress Notes (Addendum)
[facility]                 [place]  Date of Service: 11/03/2012    Chief complaint:   Chief Complaint   Patient presents with   . Anticoagulation       SUBJECTIVE  Patient here to ask about anticoagulation for upcoming Wada testing in [place] on 11/19/12 for pre-evaluation of possible surgery for his epilepsy. He is on warfarin 7.5 mg daily for his mechanical aortic valve and his INR has been stable on the dose for ~ 10 years. Will have colonoscopy also with Dr. Jeb Levering at the end of August.     Review of Systems   Constitutional: negative for fevers, chills and weight loss,   Eyes: negative,   Ears, nose, mouth, throat, and face: negative   Respiratory: negative,   Cardiovascular: negative for chest pain and palpitations   Gastrointestinal: negative for melena and diarrhea   Genitourinary:negative   Integument/breast: negative   Hematologic/lymphatic: negative   Musculoskeletal:negative   Neurological: negative   Behavioral/Psych: negative   Endocrine: negative   Allergic/Immunologic: negative    Current Outpatient Prescriptions   Medication Sig   . enoxaparin (LOVENOX) 100 mg/mL Subcutaneous Syringe 1 mL (100 mg total) by Subcutaneous route Every 12 hours for 10 days   . levETIRAcetam (KEPPRA) 500 mg Oral Tablet Take 2,000 mg by mouth Twice daily   . OXcarbazepine (TRILEPTAL) 600 mg Oral Tablet Take 600 mg by mouth Twice daily   . tamsulosin (FLOMAX) 0.4 mg Oral Capsule, Sust. Release 24 hr Take 0.4 mg by mouth Every evening after dinner   . warfarin (COUMADIN) 7.5 mg Oral Tablet Take 1 Tab (7.5 mg total) by mouth Every evening       OBJECTIVE    BP 140/80  Pulse 76  Temp(Src) 36.7 C (98 F) (Oral)  Resp 16  Ht 1.829 m (6')  Wt 96.163 kg (212 lb)  BMI 28.75 kg/m2    General: Appears well, NAD  Skin: Pink, warm, and dry  Neuro: CN 2-12 grossly intact  HEENT: PERRL, EOMI, sclera white, conjunctiva pink, MMM, throat non-erythematous, no lymphadenopathy.   Heart: RRR, normal S1 and  S2. No murmurs.  Lungs: Clear to auscultation, no wheezes, crackles, or rhonchi.  Abdomen: Soft, non-tender, bowel sounds active in all quadrants.   Back: No CVA tenderness  Extremities: No edema or cyanosis    ASSESSMENT/PLAN  Patient Active Problem List    Diagnosis Date Noted   . Anticoagulation goal of INR 2 to 3 11/03/2012     Stable on warfarin 7.5 mg daily. POCT INR 2.5 on 11/03/12.     . H/O diverticulitis of colon 11/03/2012     History of partial colectomy in 2011.      Marland Kitchen Epilepsy 11/03/2012     On trileptal and Keppra. Being followed in [place].      . Bleeding per rectum 09/05/2012     Resolved since May. Likely due to supratherapeutic INR. Will have colonoscopy.      . Mechanical heart valve present 09/05/2012     Aortic valve replacement done in 2003 for bicuspid valve.     . Kidney stone 08/04/2011     - For his Wada testing on 11/19/12, he will hold coumadin 5 days priorand start Lovenox. Instructed to hold Lovenox dose the night before procedure. And resume Lovenox and warfarin after procedure until INR becomes therapeutic. Will check INR on 11/21/12 and follow  up at the office on 11/23/12.    Orders Placed This Encounter   . PROTHROMBIN TIME, THERAPEUTIC - [facility] ONLY   . POCT EAST PROTHROMBIN TIME (AMB)   . enoxaparin (LOVENOX) 100 mg/mL Subcutaneous Syringe       Caroline More, MD 11/03/2012, 8:34 PM          I discussed management with the resident. I reviewed the resident's note and agree with the documented findings and plan of care.  Stressed the patient needs to call the radiology department and confirm their protocol re anticoagulation for this procedure.      Driscilla Moats, DO 11/11/2012, 1:22 PM

## 2012-11-03 NOTE — Progress Notes (Signed)
BP 140/80   Pulse 76   Temp(Src) 36.7 C (98 F) (Oral)   Resp 16   Ht 1.829 m (6')   Wt 96.163 kg (212 lb)   BMI 28.75 kg/m2

## 2012-11-03 NOTE — Progress Notes (Signed)
11/03/12 1800   [facility] PT/INR   Date INR drawn 11/03/12   PT 24.7   INR 2.5   Initials yc      11/03/12 1800   [facility] PT/INR   Date INR drawn 11/03/12   PT 24.7   INR 2.5   Initials yc   Nicki Reaper 11/03/2012, 6:46 PM

## 2012-11-23 ENCOUNTER — Ambulatory Visit
Admission: RE | Admit: 2012-11-23 | Discharge: 2012-11-23 | Disposition: A | Discharge status: Home / Self Care | Payer: MEDICAID | Source: Ambulatory Visit

## 2012-11-23 ENCOUNTER — Telehealth (INDEPENDENT_AMBULATORY_CARE_PROVIDER_SITE_OTHER): Payer: Self-pay

## 2012-11-23 DIAGNOSIS — Z7901 Long term (current) use of anticoagulants: Secondary | ICD-10-CM | POA: Insufficient documentation

## 2012-11-23 DIAGNOSIS — Z954 Presence of other heart-valve replacement: Secondary | ICD-10-CM | POA: Insufficient documentation

## 2012-11-23 LAB — PROTHROMBIN TIME, THERAPEUTIC
COUMADIN-LAST DOSE DATE: 81014
INR: 1.28
PROTHROMBIN TIME: 13.5 s (ref 9.8–11.1)
TIME OF LAST DOSE: 2000

## 2012-11-23 NOTE — Telephone Encounter (Signed)
PTT  13.5  INR 1.28    Please advise

## 2012-11-24 NOTE — Telephone Encounter (Signed)
Patient's wife called in to get the plan of husbands' s coumadin dosage.  [phone]

## 2012-11-25 ENCOUNTER — Telehealth (INDEPENDENT_AMBULATORY_CARE_PROVIDER_SITE_OTHER): Payer: Self-pay

## 2012-11-25 MED ORDER — ENOXAPARIN 100 MG/ML SUBCUTANEOUS SYRINGE
1.00 mg/kg | INJECTION | Freq: Two times a day (BID) | SUBCUTANEOUS | Status: DC
Start: 2012-11-25 — End: 2012-12-03

## 2012-11-25 NOTE — Telephone Encounter (Deleted)
Patient with INR 1.28 after 3 doses of 7.5 mg warfarin. Advised to recheck INR tomorrow which would be after his 5th dose. At the mean time, Patient would continue to take therapeutic dose Lovenox.     Caroline More, MD 11/25/2012, 4:04 PM

## 2012-11-25 NOTE — Telephone Encounter (Signed)
Patient with INR of 1.28 after 3 doses of warfarin 7.5 mg. Patient with history of aortic valve replace ment with [facility] Jude mechanical valve. His goal INR has been 2-3. He reports that INR had been controlled very well with 7.5 mg over 10 years.  Dose was changed to 10 mg daily earlier this year and he developed GI bleeding and went back to 7.5 mg daily dosing. Advised patient to keep taking 7.5 mg and repeat INR after 5 doses which is tomorrow.   Caroline More, MD 11/25/2012, 7:18 PM

## 2012-11-25 NOTE — Telephone Encounter (Signed)
Will talk to patient and advise.

## 2012-11-25 NOTE — Telephone Encounter (Signed)
Advised patient that his INR is subtherapetic at 1.28 on 8/11.  He currently is taking coumadin 7.5 mg daily and Lovenox 100 mg BID.    Patient advised to take 10 mg tonight and tomorrow night and to have INR checked the morning of 8/15.    Lovenox 100 mg BID ordered to continue bridge as patient has a mechanical valve and can not come off Lovenox until INR > 2.5 for at least 24-48 hours.  Will follow lab and notify patient of result Friday afternoon.    Griffith Citron, MD 11/25/2012, 9:45 AM

## 2012-11-26 ENCOUNTER — Ambulatory Visit
Admission: RE | Admit: 2012-11-26 | Discharge: 2012-11-26 | Disposition: A | Discharge status: Home / Self Care | Payer: MEDICAID | Source: Ambulatory Visit

## 2012-11-26 ENCOUNTER — Telehealth (INDEPENDENT_AMBULATORY_CARE_PROVIDER_SITE_OTHER): Payer: Self-pay

## 2012-11-26 DIAGNOSIS — Z7901 Long term (current) use of anticoagulants: Secondary | ICD-10-CM | POA: Insufficient documentation

## 2012-11-26 DIAGNOSIS — Z954 Presence of other heart-valve replacement: Secondary | ICD-10-CM | POA: Insufficient documentation

## 2012-11-26 LAB — PROTHROMBIN TIME, THERAPEUTIC
COUMADIN-LAST DOSE DATE: 8132014
INR: 1.57
PROTHROMBIN TIME: 16.7 s — ABNORMAL HIGH (ref 9.8–11.1)

## 2012-11-26 NOTE — Telephone Encounter (Signed)
Patient's INR today at 1.57 after 6 doses of 7.5 mg warfarin. Patient advised to increase the dose to 10 mg tonight and tomorrow and recheck INR on 11/28/12 AM. Will continue to have Lovenox shots at home.     Caroline More, MD 11/26/2012, 5:08 PM

## 2012-11-30 ENCOUNTER — Telehealth (INDEPENDENT_AMBULATORY_CARE_PROVIDER_SITE_OTHER): Payer: Self-pay

## 2012-11-30 ENCOUNTER — Ambulatory Visit
Admission: RE | Admit: 2012-11-30 | Discharge: 2012-11-30 | Disposition: A | Discharge status: Home / Self Care | Payer: MEDICAID | Source: Ambulatory Visit

## 2012-11-30 DIAGNOSIS — Z7901 Long term (current) use of anticoagulants: Secondary | ICD-10-CM | POA: Insufficient documentation

## 2012-11-30 DIAGNOSIS — Z954 Presence of other heart-valve replacement: Secondary | ICD-10-CM | POA: Insufficient documentation

## 2012-11-30 LAB — PROTHROMBIN TIME, THERAPEUTIC
COUMADIN-LAST DOSE DATE: 8172014
INR: 2

## 2012-12-01 NOTE — Telephone Encounter (Signed)
INR 2.00  PTT 21.5    Please advise.  MV

## 2012-12-01 NOTE — Telephone Encounter (Signed)
INR at 2.0. Goal range 2-3 for him. Patient advised to continue warfarin 10 mg every night. F/u in the clinic in one week.     Caroline More, MD 12/01/2012, 8:57 AM

## 2012-12-02 ENCOUNTER — Ambulatory Visit
Admission: RE | Admit: 2012-12-02 | Discharge: 2012-12-02 | Disposition: A | Discharge status: Home / Self Care | Payer: MEDICAID | Source: Ambulatory Visit | Attending: GENERAL SURGERY | Admitting: GENERAL SURGERY

## 2012-12-02 DIAGNOSIS — Z8719 Personal history of other diseases of the digestive system: Secondary | ICD-10-CM | POA: Insufficient documentation

## 2012-12-02 DIAGNOSIS — D6859 Other primary thrombophilia: Secondary | ICD-10-CM | POA: Insufficient documentation

## 2012-12-02 DIAGNOSIS — Z01810 Encounter for preprocedural cardiovascular examination: Secondary | ICD-10-CM | POA: Insufficient documentation

## 2012-12-02 DIAGNOSIS — Z01812 Encounter for preprocedural laboratory examination: Secondary | ICD-10-CM | POA: Insufficient documentation

## 2012-12-02 LAB — PROTHROMBIN TIME, THERAPEUTIC
COUMADIN-LAST DOSE DATE: 8192014
INR: 2.05
PROTHROMBIN TIME: 22 s — ABNORMAL HIGH (ref 9.8–11.1)

## 2012-12-03 ENCOUNTER — Encounter (INDEPENDENT_AMBULATORY_CARE_PROVIDER_SITE_OTHER): Payer: Self-pay | Admitting: GENERAL SURGERY

## 2012-12-03 ENCOUNTER — Ambulatory Visit
Admission: RE | Admit: 2012-12-03 | Discharge: 2012-12-03 | Disposition: A | Discharge status: Home / Self Care | Payer: Managed Care, Other (non HMO) | Source: Ambulatory Visit | Attending: GENERAL SURGERY | Admitting: GENERAL SURGERY

## 2012-12-03 HISTORY — DX: Depression, unspecified: F32.A

## 2012-12-03 HISTORY — DX: Unspecified viral hepatitis C without hepatic coma: B19.20

## 2012-12-03 HISTORY — DX: Abnormal electrocardiogram (ECG) (EKG): R94.31

## 2012-12-03 HISTORY — DX: Calculus of kidney: N20.0

## 2012-12-03 HISTORY — DX: Hemorrhage of anus and rectum: K62.5

## 2012-12-03 HISTORY — DX: Anxiety disorder, unspecified: F41.9

## 2012-12-03 NOTE — Progress Notes (Signed)
Review H&P    History: Nothing new to record  Medications: Nothing new to record  Allergies: Nothing new to record  ROS: Chest Pain/SOB: No

## 2012-12-03 NOTE — Nurses Notes (Signed)
Preoperative telephone interview completed at this time. Patient verbalizes understanding of preoperative instructions. Day of surgery instructions given. All questions answered. Patient instructed to take Seizure medications the morning of surgery with a small sip of water.

## 2012-12-08 ENCOUNTER — Encounter: Admission: RE | Payer: Self-pay | Source: Ambulatory Visit

## 2012-12-08 SURGERY — COLONOSCOPY
Anesthesia: General

## 2013-01-05 ENCOUNTER — Emergency Department
Admission: EM | Admit: 2013-01-05 | Discharge: 2013-01-05 | Disposition: A | Discharge status: Other Acute Inpatient Hospital | Payer: MEDICAID | Attending: Emergency Medicine | Admitting: Emergency Medicine

## 2013-01-05 DIAGNOSIS — F17200 Nicotine dependence, unspecified, uncomplicated: Secondary | ICD-10-CM | POA: Insufficient documentation

## 2013-01-05 DIAGNOSIS — F411 Generalized anxiety disorder: Secondary | ICD-10-CM | POA: Insufficient documentation

## 2013-01-05 HISTORY — DX: Calculus in bladder: N21.0

## 2013-01-05 LAB — CBC
BASOPHIL #: 0.1 K/uL (ref 0.0–0.10)
BASOPHILS %: 0.5 % (ref 0–2.50)
EOSINOPHIL #: 0 K/uL (ref 0.00–0.50)
EOSINOPHIL %: 0.2 % (ref 0.0–5.2)
HCT: 41.3 % (ref 40.0–54.0)
HGB: 13.9 g/dL (ref 13.7–18.0)
LYMPHOCYTE #: 0.7 K/uL (ref 0.7–3.20)
LYMPHOCYTE %: 5.7 % — ABNORMAL LOW (ref 15.0–43.0)
MCH: 30 pg (ref 28.3–34.3)
MCHC: 33.5 g/dL (ref 32.0–36.0)
MCV: 89.5 fL (ref 82.0–100.0)
MONOCYTE #: 0.7 K/uL (ref 0.20–0.90)
MONOCYTE %: 5.2 % (ref 4.8–12.0)
MPV: 9.1 fL (ref 7.4–10.45)
NRBC ABSOLUTE: 0.01 K/uL (ref 0–0.02)
NRBC: 0.1 /100 WBC (ref 0–0.6)
PLATELET COUNT: 142 K/uL — ABNORMAL LOW (ref 150–400)
PMN #: 11.2 10*3/uL — ABNORMAL HIGH (ref 1.5–6.5)
PMN %: 88.4 % — ABNORMAL HIGH (ref 43.0–76.0)
RBC: 4.62 M/uL (ref 4.5–6.0)
RDW: 16.1 % (ref 11.0–16.0)
WBC: 12.7 K/uL (ref 4.0–11.0)

## 2013-01-05 LAB — COMPREHENSIVE METABOLIC PROFILE - BMC/JMC ONLY
ALBUMIN/GLOBULIN RATIO: 1.4
ALBUMIN: 4 g/dL (ref 3.5–5.0)
ALKALINE PHOSPHATASE: 98 IU/L (ref 38–126)
ALT (SGPT): 26 IU/L (ref 17–63)
ANION GAP: 6 mmol/L (ref 3–11)
AST (SGOT): 37 IU/L (ref 15–41)
BILIRUBIN, TOTAL: 1.3 mg/dL — ABNORMAL HIGH (ref 0.3–1.2)
BUN: 22 mg/dL — ABNORMAL HIGH (ref 6–20)
CALCIUM: 9.2 mg/dL (ref 8.8–10.2)
CARBON DIOXIDE: 28 mmol/L (ref 22–32)
CHLORIDE: 98 mmol/L — ABNORMAL LOW (ref 101–111)
CREATININE: 1.13 mg/dL (ref 0.61–1.24)
ESTIMATED GLOMERULAR FILTRATION RATE: >60 mL/min (ref >60)
GLUCOSE: 110 mg/dL (ref 70–110)
POTASSIUM: 4.2 mmol/L (ref 3.4–5.1)
SODIUM: 132 mmol/L — ABNORMAL LOW (ref 136–145)
TOTAL PROTEIN: 6.8 g/dL (ref 6.4–8.3)

## 2013-01-05 LAB — RBC MORPHOLOGY - JMC ONLY: PLATELET ESTIMATE: ADEQUATE

## 2013-01-05 LAB — TYPE AND SCREEN - BMC/JMC ONLY
ABO/RH(D): A POS
ANTIBODY SCREEN: NEGATIVE

## 2013-01-05 LAB — PT/INR: INR NORMALIZED: 1.34

## 2013-01-05 MED ORDER — OXCARBAZEPINE 300 MG TABLET
600.00 mg | ORAL_TABLET | ORAL | Status: AC
Start: 2013-01-05 — End: 2013-01-05
  Administered 2013-01-05: 600 mg via ORAL
  Filled 2013-01-05: qty 2

## 2013-01-05 MED ORDER — SODIUM CHLORIDE 0.9 % (FLUSH) INJECTION SYRINGE
2.50 mL | INJECTION | Freq: Three times a day (TID) | INTRAMUSCULAR | Status: DC
Start: 2013-01-05 — End: 2013-01-06
  Administered 2013-01-05: 2.5 mL via INTRAVENOUS
  Filled 2013-01-05 (×2): qty 2.5

## 2013-01-05 MED ORDER — ONDANSETRON HCL (PF) 4 MG/2 ML INJECTION SOLUTION
4.0000 mg | INTRAMUSCULAR | Status: AC
Start: 2013-01-05 — End: 2013-01-05
  Filled 2013-01-05: qty 2

## 2013-01-05 MED ORDER — LEVETIRACETAM 250 MG TABLET
1000.00 mg | ORAL_TABLET | ORAL | Status: AC
Start: 2013-01-05 — End: 2013-01-05
  Administered 2013-01-05: 1000 mg via ORAL
  Filled 2013-01-05: qty 4

## 2013-01-05 MED ADMIN — HYDROmorphone (PF) 1 mg/mL injection solution: 1 mg | INTRAVENOUS | NDC 00409128331

## 2013-01-05 MED ADMIN — ondansetron HCl (PF) 4 mg/2 mL injection solution: 4 mg | INTRAVENOUS | NDC 00641607801

## 2013-01-05 MED FILL — HYDROmorphone 1 mg/mL injection syringe: 1.0000 mg | INTRAMUSCULAR | Qty: 1 | Status: AC

## 2013-01-05 NOTE — ED Nurses Note (Signed)
Report called to Renaldo Harrison RN in Emergency Department at [facility].

## 2013-01-05 NOTE — ED Nurses Note (Signed)
Patient loaded on EMS stretcher and care transferred.  Patient to [facility]

## 2013-01-05 NOTE — ED Provider Notes (Signed)
[facility]  Emergency Department     HISTORY OF PRESENT ILLNESS     Date:  01/05/2013  Patient's Name:  [name]  Date of Birth:  10/25/1950    Patient is a 62 y.o. male presenting with abdominal pain.   History provided by:  Patient  Abdominal Pain  Pain location:  RLQ  Pain quality: aching and shooting    Pain radiates to:  Back  Pain severity:  Moderate  Onset quality:  Sudden  Timing:  Constant  Progression:  Worsening  Chronicity:  New  Associated symptoms: nausea    Associated symptoms: no chest pain, no chills, no constipation, no cough, no diarrhea, no fever, no shortness of breath and no vomiting    Nausea:     Severity:  Moderate    Onset quality:  Sudden    Timing:  Constant    Progression:  Unchanged    62 Y/O PT PRESENTED INTO THE ED WITH C/O ABD PAIN. PT REPORTS SEVER LOWER RIGHT ABD PAIN. PT REPORTED HE HAS A BLADDER PROCEDURE DONE AT [facility] WHERE THEY BROKE UP HIS BLADDER STONES. PT STS AN HOUR AFTER LEAVING THE HOSPITAL HE STARTED HAVING ABD PAIN. PT STS HE HAS NOT URINATED SINCE LEAVING THE HOSPITAL. PT ALSO REPORTS PAIN RADIATES TO HIS BACK. PT STS HE IS ON COUMADIN FOR MECHANICAL HEART VALVE.     Review of Systems     Review of Systems   Constitutional: Negative for fever and chills.   Respiratory: Negative for cough and shortness of breath.    Cardiovascular: Negative for chest pain.   Gastrointestinal: Positive for nausea and abdominal pain. Negative for vomiting, diarrhea and constipation.   Genitourinary: Negative for frequency.   Musculoskeletal: Positive for back pain.   Skin: Negative for rash.   Neurological: Negative for headaches.   All other systems reviewed and are negative.        Previous History     Past Medical History:  Past Medical History   Diagnosis Date   . Aortic valve replaced    . Diverticulitis    . Hepatitis C    . Wears glasses    . Valvular disease    . Abnormal EKG    . Seizures      2 weeks last seizure    . Depression    . Anxiety    . Rectal bleeding    . Kidney stones    . Viral hepatitis C    . Bladder stone        Past Surgical History:  Past Surgical History   Procedure Laterality Date   . Hx knee replacment Left    . Hx gall bladder surgery/chole     . Hx aortic valve replacement     . Hx colectomy  2010   . Hx colonoscopy         Social History:  History   Substance Use Topics   . Smoking status: Current Every Day Smoker -- 0.50 packs/day   . Smokeless tobacco: Not on file   . Alcohol Use: Yes      Comment: rarely     History   Drug Use No       Family History:  Family History   Problem Relation Age of Onset   . COPD Mother    . Heart Surgery Father      CABG   . Prostate Cancer Father 56   .  Heart Disease Father        Medication History:  Current Outpatient Prescriptions   Medication Sig   . escitalopram oxalate (LEXAPRO) 10 mg Oral Tablet Take 10 mg by mouth Once a day   . levETIRAcetam (KEPPRA) 500 mg Oral Tablet Take 2,000 mg by mouth Twice daily   . OXcarbazepine (TRILEPTAL) 600 mg Oral Tablet Take 600 mg by mouth Twice daily   . tamsulosin (FLOMAX) 0.4 mg Oral Capsule, Sust. Release 24 hr Take 0.4 mg by mouth Every evening after dinner   . warfarin (COUMADIN) 7.5 mg Oral Tablet Take 1 Tab (7.5 mg total) by mouth Every evening       Allergies:  Allergies   Allergen Reactions   . Morphine  Other Adverse Reaction (Add comment)     combative       Physical Exam     Vitals:    BP 152/78   Pulse 75   Temp(Src) 34.9 C (94.8 F)   Resp 16   Ht 1.829 m (6' 0.01")   Wt 90.719 kg (200 lb)   BMI 27.12 kg/m2   SpO2 99%    Physical Exam   Nursing note and vitals reviewed.    Constitutional:  Well developed, well nourished.  Awake & alert. Moderate distress. Dry heaving.   Head:  Atraumatic.  Normocephalic.    Eyes:  PERRL.  EOMI.  Conjunctivae are not pale.  ENT:  Mucous membranes are moist and intact.  Oropharynx is clear and symmetric.  Patent airway.  Neck:  Supple.  Full ROM.  No JVD.  No lymphadenopathy.   Cardiovascular:  Regular rate.  Regular rhythm.  No murmurs, rubs, or gallops.  Distal pulses are 2+ and symmetric.  Pulmonary/Chest:  No evidence of respiratory distress.  Clear to auscultation bilaterally.  No wheezing, rales or rhonchi. Chest non-tender.  Abdominal:  Soft and non-distended.  No rebound, guarding, or rigidity.  No organomegaly.  Good bowel sounds. RLQ tenderness.   Back:  FROM. Right CVA tenderness. Right lower back pain.   Extremities:  No edema.   No cyanosis.  No clubbing.  Full range of motion in all extremities.  No calf tenderness.  Skin:  Skin is warm and dry.  No diaphoresis. No rash.   Neurological:  Alert, awake, and appropriate.  Normal speech.  Sensation normal. Motor strengths 5/5. CN II-XII intact.   Psychiatric:  Good eye contact.  Normal interaction, affect, and behavior.    Diagnostic Studies/Treatment     Medications:  Medications   NS flush syringe (2.5 mL Intravenous Given 01/05/13 2049)   levETIRAcetam (KEPPRA) tablet (not administered)   OXcarbazepine (TRILEPTAL) tablet (not administered)   ondansetron (ZOFRAN) 2 mg/mL injection (4 mg Intravenous Given 01/05/13 2049)   HYDROmorphone (DILAUDID) 1 mg/mL injection (1 mg Intravenous Given 01/05/13 2049)   HYDROmorphone (DILAUDID) 1 mg/mL injection (1 mg Intravenous Given 01/05/13 2118)       New Prescriptions    No medications on file       Labs:    Results for orders placed during the hospital encounter of 01/05/13   CBC       Result Value Range    WBC 12.7 (*) 4.0 - 11.0 K/uL    RBC 4.62  4.5 - 6.0 M/uL    HGB 13.9 (*) 13.7 - 18.0 g/dL    HCT 40.9 (*) 81.1 - 54.0 %    MCV 89.5  82.0 - 100.0 fL    MCH 30.0  28.3 - 34.3 pg    MCHC 33.5  32.0 - 36.0 g/dL    RDW 16.1 (*) 09.6 - 16.0 %    PLATELET COUNT 142 (*) 150 - 400 K/uL    MPV 9.1  7.4 - 10.45 fL    NRBC 0.1  0 - 0.6 /100 WBC    NRBC ABSOLUTE 0.01  0 - 0.02 K/uL    PMN % 88.4 (*) 43.0 - 76.0 %    LYMPHOCYTE % 5.7 (*) 15.0 - 43.0 %    MONOCYTE % 5.2  4.8 - 12.0 %     EOSINOPHIL % 0.2  0.0 - 5.2 %    BASOPHILS % 0.5  0 - 2.50 %    PMN # 11.20 (*) 1.5 - 6.5 K/uL    LYMPHOCYTE # 0.70  0.7 - 3.20 K/uL    MONOCYTE # 0.70  0.20 - 0.90 K/uL    EOSINOPHIL # 0.00  0.00 - 0.50 K/uL    BASOPHIL # 0.10  0.0 - 0.10 K/uL   COMPREHENSIVE METABOLIC PROFILE - [facility] ONLY       Result Value Range    GLUCOSE 110  70 - 110 mg/dL    BUN 22 (*) 6 - 20 mg/dL    CREATININE 0.45  4.09 - 1.24 mg/dL    ESTIMATED GLOMERULAR FILTRATION RATE >60  >60 ml/min    SODIUM 132 (*) 136 - 145 mmol/L    POTASSIUM 4.2  3.4 - 5.1 mmol/L    CHLORIDE 98 (*) 101 - 111 mmol/L    CARBON DIOXIDE 28  22 - 32 mmol/L    ANION GAP 6  3 - 11 mmol/L    CALCIUM 9.2  8.8 - 10.2 mg/dL    TOTAL PROTEIN 6.8  6.4 - 8.3 g/dL    ALBUMIN 4.0  3.5 - 5.0 g/dL    ALBUMIN/GLOBULIN RATIO 1.4      BILIRUBIN, TOTAL 1.3 (*) 0.3 - 1.2 mg/dL    AST (SGOT) 37  15 - 41 IU/L    ALT (SGPT) 26  17 - 63 IU/L    ALKALINE PHOSPHATASE 98  38 - 126 IU/L   LIPASE       Result Value Range    LIPASE 26  22 - 51 U/L   PT/INR       Result Value Range    PROTHROMBIN TIME 14.2 (*) 9.8 - 11.1 sec    INR NORMALIZED 1.34     RBC MORPHOLOGY - [facility] ONLY       Result Value Range    PLATELET ESTIMATE ADEQUATE  ADEQUATE    LARGE PLATELETS FEW     TYPE AND SCREEN - [facility] ONLY       Result Value Range    ABO/RH(D) A POSITIVE      ANTIBODY SCREEN NEGATIVE         Radiology:  None         ECG:  No results found for this visit on 01/05/13 (from the past 720 hour(s)).     No results found for this or any previous visit (from the past 720 hour(s)).    Procedure     Procedures    Course/Disposition/Plan     Course:    No CT available. Spoke with [facility] with Dr. Ellery Plunk and they do not have a urologist on call today    Spoke with Dr. Simmie Davies at [facility] and was okay with the transfer of the patient but urologist declined  because his recommends that patient was to go back with surgeon. Spoke with family and they are okay to go back to [facility].      Spoke with Dr. Rachael Darby at [facility] and  accepted patient for transfer.      Disposition:   Transfered to Another Facility    Follow up:   No follow-up provider specified.    Clinical Impression:     Encounter Diagnoses   Name Primary?   . Abdominal pain Yes   . Anticoagulated        Future Appointments Scheduled in Epic:  No future appointments.    SCRIBE ATTESTATION   This note is prepared by Eloise Levels, acting as Scribe for Dr. Merilynn Finland    The scribe's documentation has been prepared under my direction and personally reviewed by me in its entirety.  I confirm that the note above accurately reflects all work, treatment, procedures, and medical decision making performed by me, Dr. Merilynn Finland

## 2013-01-05 NOTE — ED Nurses Note (Signed)
 had bladder stone removed today and has had worsening right lower abd pain and right flank pain, has not voided since prior to surgery

## 2013-01-05 NOTE — ED Nurses Note (Signed)
Patient desats after 2nd dose of dilaudid.  Placed on 2LPM NC.

## 2013-01-05 NOTE — ED Nurses Note (Signed)
Report to [facility].  Awaiting PO meds before transfer.

## 2013-01-14 ENCOUNTER — Telehealth (INDEPENDENT_AMBULATORY_CARE_PROVIDER_SITE_OTHER): Payer: Self-pay

## 2013-01-14 NOTE — Telephone Encounter (Signed)
Pt is on warfarin and is having a kidney stone removed Wednesday. He has had to come off warfarin before and now is in need of the directions quickly. Can you please take care of giving instructions and lovenox as Dr. Cherly Hensen is not here today.  Please advise.    [facility]  Registration Specialist

## 2013-01-15 ENCOUNTER — Ambulatory Visit: Admission: RE | Admit: 2013-01-15 | Payer: MEDICAID | Source: Ambulatory Visit | Admitting: GENERAL SURGERY

## 2013-01-15 ENCOUNTER — Ambulatory Visit
Admission: RE | Admit: 2013-01-15 | Discharge: 2013-01-15 | Disposition: A | Discharge status: Home / Self Care | Payer: MEDICAID | Source: Ambulatory Visit

## 2013-01-15 DIAGNOSIS — Z7901 Long term (current) use of anticoagulants: Secondary | ICD-10-CM | POA: Insufficient documentation

## 2013-01-15 LAB — PROTHROMBIN TIME, THERAPEUTIC
COUMADIN DOSE: 7.5
COUMADIN-LAST DOSE DATE: 10022014
INR: 1.57
PROTHROMBIN TIME: 16.7 s — ABNORMAL HIGH (ref 9.8–11.1)
TIME OF LAST DOSE: 2100

## 2013-01-15 NOTE — Telephone Encounter (Signed)
INR 1.57 today.  Surgery scheduled for Wednesday.

## 2013-01-18 ENCOUNTER — Encounter (INDEPENDENT_AMBULATORY_CARE_PROVIDER_SITE_OTHER): Payer: Self-pay | Admitting: Family Medicine

## 2013-01-18 ENCOUNTER — Ambulatory Visit (INDEPENDENT_AMBULATORY_CARE_PROVIDER_SITE_OTHER): Payer: MEDICAID | Admitting: Family Medicine

## 2013-01-18 VITALS — BP 130/70 | HR 68 | Temp 98.1°F | Resp 16 | Ht 72.0 in | Wt 204.5 lb

## 2013-01-18 MED ORDER — ENOXAPARIN 100 MG/ML SUBCUTANEOUS SYRINGE
1.0000 mg/kg | INJECTION | Freq: Two times a day (BID) | SUBCUTANEOUS | Status: AC
Start: 2013-01-18 — End: 2013-01-28

## 2013-01-18 NOTE — Progress Notes (Signed)
[name]  Date of Service: 01/18/2013    Chief complaint:   Chief Complaint   Patient presents with   . Medication Adjustment     coumadin     Subjective  Patient in for followup on long term anticoagulation. He is to undergo surgery for kidney stones. Has artificial heart valve. He needs full dose bridging therapy. No excessive bruising or bleeding noted.    Current Outpatient Prescriptions   Medication Sig   . enoxaparin (LOVENOX) 100 mg/mL Subcutaneous Syringe 1 mL (100 mg total) by Subcutaneous route Every 12 hours for 10 days   . escitalopram oxalate (LEXAPRO) 10 mg Oral Tablet Take 10 mg by mouth Once a day   . levETIRAcetam (KEPPRA) 500 mg Oral Tablet Take 2,000 mg by mouth Twice daily   . OXcarbazepine (TRILEPTAL) 600 mg Oral Tablet Take 600 mg by mouth Twice daily   . tamsulosin (FLOMAX) 0.4 mg Oral Capsule, Sust. Release 24 hr Take 0.4 mg by mouth Every evening after dinner   . warfarin (COUMADIN) 7.5 mg Oral Tablet Take 1 Tab (7.5 mg total) by mouth Every evening     Allergies   Allergen Reactions   . Morphine  Other Adverse Reaction (Add comment)     combative         Objective  Vitals: BP 130/70   Pulse 68   Temp(Src) 36.7 C (98.1 F) (Oral)   Resp 16   Ht 1.829 m (6')   Wt 92.761 kg (204 lb 8 oz)   BMI 27.73 kg/m2  General: no distress  Lungs: clear to auscultation bilaterally.   Cardiovascular:    Heart regular rate and rhythm    Assessment/Plan  1. Mechanical heart valve present    2. Kidney stone        Orders Placed This Encounter   . enoxaparin (LOVENOX) 100 mg/mL Subcutaneous Syringe        We'll go ahead and hold Coumadin 5 days prior to surgery start Lovenox. Start that 24 hours prior to surgery. Restart after followup 3-4 days later for INR after restarting Coumadin.    Sunday Corn, MD

## 2013-01-18 NOTE — Progress Notes (Signed)
BP 130/70   Pulse 68   Temp(Src) 36.7 C (98.1 F) (Oral)   Resp 16   Ht 1.829 m (6')   Wt 92.761 kg (204 lb 8 oz)   BMI 27.73 kg/m2

## 2013-01-21 ENCOUNTER — Telehealth (INDEPENDENT_AMBULATORY_CARE_PROVIDER_SITE_OTHER): Payer: Self-pay | Admitting: Family Medicine

## 2013-01-21 NOTE — Telephone Encounter (Signed)
PA FAXED TO [facility] FOR LOVENOX PENDING RESPONSE.

## 2013-01-21 NOTE — Telephone Encounter (Signed)
Covered pharmacy just needs to run as DAW.

## 2013-02-10 ENCOUNTER — Encounter (INDEPENDENT_AMBULATORY_CARE_PROVIDER_SITE_OTHER): Payer: Self-pay

## 2013-02-10 ENCOUNTER — Ambulatory Visit (INDEPENDENT_AMBULATORY_CARE_PROVIDER_SITE_OTHER): Payer: Managed Care, Other (non HMO)

## 2013-02-10 VITALS — BP 132/84 | HR 68 | Temp 98.3°F | Resp 16 | Ht 72.0 in | Wt 197.6 lb

## 2013-02-10 DIAGNOSIS — Z952 Presence of prosthetic heart valve: Secondary | ICD-10-CM

## 2013-02-10 LAB — POCT EAST PROTHROMBIN TIME (AMB)
INR: 1.2
PROTHROMBIN TIME: 11.8

## 2013-02-10 NOTE — Progress Notes (Signed)
02/10/13 1400   [facility] PT/INR   PT 11.8   INR 1.2   Initials MH

## 2013-02-10 NOTE — Progress Notes (Signed)
 BP 132/84  Pulse 68  Temp(Src) 36.8 C (98.3 F) (Oral)  Resp 16  Ht 1.829 m (6')  Wt 89.631 kg (197 lb 9.6 oz)  BMI 26.79 kg/m2  Ruben ONEIDA Brookes, LPN 89/70/7985, 2:38 PM

## 2013-02-10 NOTE — Progress Notes (Addendum)
 [facility]  INR FOLLOW UP VISIT     DATE: 02/10/2013      Subjective:     Patient ID:  [name] is an 62 y.o. male   Chief Complaint:    Chief Complaint   Patient presents with   . Anticoagulation       HPI  [name] is an 62 y.o. White male with a PMH of a mechanical aortic valve placed in 2002, seizure disorder, and Hep C. He presents for an INR check.   [name] recently underwent kidney stent placement for recurrent calculi; this required him to d/c his warfarin for the procedure. He has started back on it, taking 7.5mg  nightly. [name] was increased to as much as 10 mg in August 2014, but this was decreased due to rectal bleeding.       Outpatient Prescriptions Prior to Visit:  escitalopram  oxalate (LEXAPRO ) 10 mg Oral Tablet Take 10 mg by mouth Once a day   levETIRAcetam  (KEPPRA ) 500 mg Oral Tablet Take 2,000 mg by mouth Twice daily   OXcarbazepine  (TRILEPTAL ) 600 mg Oral Tablet Take 600 mg by mouth Twice daily   tamsulosin  (FLOMAX ) 0.4 mg Oral Capsule, Sust. Release 24 hr Take 0.4 mg by mouth Every evening after dinner   warfarin (COUMADIN) 7.5 mg Oral Tablet Take 1 Tab (7.5 mg total) by mouth Every evening     No facility-administered medications prior to visit.      ROS  Objective:  BP 132/84  Pulse 68  Temp(Src) 36.8 C (98.3 F) (Oral)  Resp 16  Ht 1.829 m (6')  Wt 89.631 kg (197 lb 9.6 oz)  BMI 26.79 kg/m2    Physical Exam   Constitutional: He appears well-developed and well-nourished.   HENT:   Head: Normocephalic and atraumatic.   Cardiovascular: Normal rate, regular rhythm, S1 normal and S2 normal.    Mechanical click noted.    Pulm:  Effort normal and breath sounds normal.      .    Assessment & Plan:     (V43.3) Mechanical heart valve present  (primary encounter diagnosis)  Plan:   -- INR 1.2/ PTT 11.8.   -- Warfarin currently 52.5 mg weekly. Will increase by 20% to 63 mg weekly. Take 7.5mg  three times per week, and 10 mg four times per week.   -- Return in  1-2 weeks for INR re-check.       Follow up in 1-2 weeks or PRN.   Pt seen by and discussed with Dr. Zavala.   Level III visit.       Ruben Arlean Cone, MD, PGY-3. 02/10/2013, 3:05 PM    I discussed the patient's care with the Resident prior to the patient leaving the clinic. Any significant discussion points are noted .    Shelba Ide, MD 03/02/2013, 1:53 PM

## 2013-02-25 ENCOUNTER — Ambulatory Visit (INDEPENDENT_AMBULATORY_CARE_PROVIDER_SITE_OTHER): Payer: Managed Care, Other (non HMO)

## 2013-02-25 ENCOUNTER — Encounter (INDEPENDENT_AMBULATORY_CARE_PROVIDER_SITE_OTHER): Payer: Self-pay

## 2013-02-25 VITALS — BP 130/82 | HR 68 | Temp 98.2°F | Resp 16 | Ht 72.0 in | Wt 198.0 lb

## 2013-02-25 LAB — POCT EAST PROTHROMBIN TIME (AMB)
INR: 3.2
PROTHROMBIN TIME: 32.1

## 2013-02-25 NOTE — Progress Notes (Signed)
02/25/13 1000   [facility] PT/INR   Date INR drawn 02/25/13   PT 32.1   INR 3.2   Initials [address] [place], [place] 02/25/2013, 10:46 AM

## 2013-02-25 NOTE — Progress Notes (Signed)
 BP 130/82  Pulse 68  Temp(Src) 36.8 C (98.2 F) (Oral)  Resp 16  Ht 1.829 m (6')  Wt 89.812 kg (198 lb)  BMI 26.85 kg/m2  Miriam CHRISTELLA Ned, MA 02/25/2013, 10:37 AM

## 2013-02-28 ENCOUNTER — Encounter (INDEPENDENT_AMBULATORY_CARE_PROVIDER_SITE_OTHER): Payer: Self-pay

## 2013-03-01 NOTE — Progress Notes (Addendum)
[facility]  INR CHECK VISIT     DATE: 02/25/2013      Subjective:     Patient ID:  [name] is an 62 y.o. male   Chief Complaint:    Chief Complaint   Patient presents with   . Anticoagulation       HPI  [name] is an 62 y.o. White male with a mechanical heart valve placed in 2002. He recently stopped his coumadin due to a kidney stent placement. At last visit, his INR was 1.2. Coumadin was adjusted to 7.5mg  three times per week and 10 mg four times per week.   Today, INR is 3.2/PT 32.1. Acceptable range. 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     ROS  Objective:  BP 130/82   Pulse 68   Temp(Src) 36.8 C (98.2 F) (Oral)   Resp 16   Ht 1.829 m (6')   Wt 89.812 kg (198 lb)   BMI 26.85 kg/m2    Physical Exam   Constitutional: He appears well-developed and well-nourished. No distress.   Cardiovascular:   S1 S2 normal. Mechanical click noted.    Pulm:  Effort normal and breath sounds normal.      .    Assessment & Plan:     1. Mechanical heart valve present    2. Anticoagulation goal of INR 2 to 3      -- Continue coumadin at current dosage.   -- Re-check in 1-2 weeks  -- Will need to stop prior to surgery on Dec 16th.       Follow up in 2 weeks or PRN.   Pt seen by and discussed with Dr. Arvella Merles.   Level III visit.       Loraine Leriche, MD, PGY-3. 03/01/2013, 4:18 PM        Brief Attending Note    I discussed the patient with Dr. Jon Billings at the time of the visit with the patient and agree with the assessment and plan, except where noted otherwise below.          Jane Canary. Ristroph, M.D.

## 2013-03-19 ENCOUNTER — Encounter (INDEPENDENT_AMBULATORY_CARE_PROVIDER_SITE_OTHER): Payer: Managed Care, Other (non HMO)

## 2013-04-28 ENCOUNTER — Encounter (INDEPENDENT_AMBULATORY_CARE_PROVIDER_SITE_OTHER): Payer: Self-pay

## 2013-04-28 ENCOUNTER — Ambulatory Visit (INDEPENDENT_AMBULATORY_CARE_PROVIDER_SITE_OTHER): Payer: MEDICAID

## 2013-04-28 VITALS — BP 130/82 | HR 60 | Temp 98.0°F | Resp 16 | Ht 73.0 in | Wt 210.0 lb

## 2013-04-28 DIAGNOSIS — Q231 Congenital insufficiency of aortic valve: Secondary | ICD-10-CM

## 2013-04-28 DIAGNOSIS — Z5181 Encounter for therapeutic drug level monitoring: Principal | ICD-10-CM

## 2013-04-28 DIAGNOSIS — Z7901 Long term (current) use of anticoagulants: Secondary | ICD-10-CM

## 2013-04-28 LAB — POCT EAST PROTHROMBIN TIME (AMB)
INR: 1.7
INR: 1.7
PROTHROMBIN TIME: 17.3

## 2013-04-28 NOTE — Progress Notes (Addendum)
[name]  04/28/2013    Chief Complaint   Patient presents with    Anticoagulation       Subjective:  63 y.o. male in clinic today for PT/INR check. Currently on anticoagulation for a mechanical valve.   Missed doses: No. Patient stated that he was not able to get in to be seen for an INR check in December, so he dropped back to taking 7.5 mg of the Coumadin 7 days per week.   Excessive bleeding/bruising or signs/symptoms of blood clots: No   Dietary changes or recent additions of any OTC medications or supplements: No  Recent infections: No   Current Coumadin dose: 7.5 mg po each day of the week.       Objective:  Blood pressure 130/82, pulse 60, temperature 36.7 C (98 F), temperature source Oral, resp. rate 16, height 1.854 m (6\' 1" ), weight 95.255 kg (210 lb).  Physical Exam:  General: No acute distress.   Heart: RRR. No murmurs, gallops, or rubs. Mechanical click observed in each auscultation site.   Lungs: Clear to auscultation bilaterally.  Extremities: No edema.     Current warfarin dosage: 7.5mg  qd  PT: 17.3  INR: 1.7    Assessment/Plan:  1. Anticoagulation goal of INR 2 to 3  - Sub-therapeutic today at 1.7.  - Will resume previous dose of 10 mg x 4 days per week and 7.5 mg on all other days.     Follow up in 2 weeks.     Reviewed and discussed with Dr. Floydene Flock.   Level III    [name], M.D.  Resident, P.G.Y. [place]  I discussed the patient's care with the Resident prior to the patient leaving the clinic. Any significant discussion points are noted .    Hoy Register, MD 04/28/2013, 2:31 PM

## 2013-04-28 NOTE — Progress Notes (Signed)
04/28/13 1300   [facility] PT/INR   Current warfarin dosage 7.5mg  qd   PT 17.3   INR 1.7   Initials MV

## 2013-04-28 NOTE — Progress Notes (Signed)
BP 130/82   Pulse 60   Temp(Src) 36.7 C (98 F) (Oral)   Resp 16   Ht 1.854 m (6\' 1" )   Wt 95.255 kg (210 lb)   BMI 27.71 kg/m2

## 2013-05-12 ENCOUNTER — Ambulatory Visit (INDEPENDENT_AMBULATORY_CARE_PROVIDER_SITE_OTHER): Payer: MEDICAID

## 2013-05-12 ENCOUNTER — Encounter (INDEPENDENT_AMBULATORY_CARE_PROVIDER_SITE_OTHER): Payer: Self-pay

## 2013-05-12 VITALS — BP 130/70 | HR 60 | Temp 97.0°F | Resp 16 | Ht 72.0 in | Wt 212.0 lb

## 2013-05-12 DIAGNOSIS — Q231 Congenital insufficiency of aortic valve: Secondary | ICD-10-CM

## 2013-05-12 DIAGNOSIS — Z954 Presence of other heart-valve replacement: Secondary | ICD-10-CM

## 2013-05-12 DIAGNOSIS — Z7901 Long term (current) use of anticoagulants: Secondary | ICD-10-CM

## 2013-05-12 DIAGNOSIS — Z952 Presence of prosthetic heart valve: Secondary | ICD-10-CM

## 2013-05-12 DIAGNOSIS — Z5181 Encounter for therapeutic drug level monitoring: Secondary | ICD-10-CM

## 2013-05-12 LAB — POCT EAST PROTHROMBIN TIME (AMB)
INR: 2.1
PROTHROMBIN TIME: 22.1

## 2013-05-12 NOTE — Progress Notes (Signed)
05/12/13 1400   [facility] PT/INR   Date INR drawn 05/12/13   PT 22.1   INR 2.1   Initials [facility]

## 2013-05-12 NOTE — Progress Notes (Addendum)
[name]  05/12/2013    Chief Complaint   Patient presents with    Valve Surgery       Subjective:  63 y.o. male in clinic today for PT/INR check. Currently on anticoagulation for mechanical aortic valve with goal INR of 2-3 for this.   Missed doses: No  Excessive bleeding/bruising or signs/symptoms of blood clots: No   Dietary changes or recent additions of any OTC medications or supplements: No  Recent infections: No   Current Coumadin dose: 10 mg x 4 days per week and 7.5 mg on all other days.       Objective:  Blood pressure 130/70, pulse 60, temperature 36.1 C (97 F), temperature source Oral, resp. rate 16, height 1.829 m (6'), weight 96.163 kg (212 lb).  Physical Exam:  General: No acute distress.   Heart: RRR. Metallic beats from the mechanical valve noted in all auscultation points.   Lungs: Clear to auscultation bilaterally.  Abdomen: Soft. Nontender. Nondistended. Active BS in all quadrants.   Extremities: No edema.       Date INR drawn: 05/12/13  PT: 22.1  INR: 2.1    Assessment/Plan:  1. Mechanical heart valve present  2. Anticoagulation, INR goal 2-3    - INR therapeutic today.  - Continue current regimen of 10 mg x 4 days and 7.5 mg on all other days of the week.     Follow up in 2 weeks.     Reviewed and discussed with Dr. Trish Mage.   Level III    [name], M.D.  Resident, P.G.Y. [facility]      Encounter Date: 05/12/2013    I discussed management with the resident. I reviewed the resident's note and agree with the documented findings and plan of care except as noted below:      Nuala Alpha, MD

## 2013-05-12 NOTE — Progress Notes (Signed)
BP 130/70   Pulse 60   Temp(Src) 36.1 C (97 F) (Oral)   Resp 16   Ht 1.829 m (6')   Wt 96.163 kg (212 lb)   BMI 28.75 kg/m2  Bing Quarry, [place] 05/12/2013, 1:54 PM

## 2013-05-18 ENCOUNTER — Other Ambulatory Visit (INDEPENDENT_AMBULATORY_CARE_PROVIDER_SITE_OTHER): Payer: Self-pay

## 2013-05-19 MED ORDER — LEVETIRACETAM 500 MG TABLET
2000.0000 mg | ORAL_TABLET | Freq: Two times a day (BID) | ORAL | Status: DC
Start: 2013-05-18 — End: 2013-05-25

## 2013-05-19 MED ORDER — OXCARBAZEPINE 600 MG TABLET
600.0000 mg | ORAL_TABLET | Freq: Two times a day (BID) | ORAL | Status: DC
Start: 2013-05-18 — End: 2013-05-25

## 2013-05-19 MED ORDER — ESCITALOPRAM 10 MG TABLET
10.0000 mg | ORAL_TABLET | Freq: Every day | ORAL | Status: DC
Start: 2013-05-18 — End: 2013-05-25

## 2013-05-19 MED ORDER — WARFARIN 7.5 MG TABLET
7.5000 mg | ORAL_TABLET | Freq: Every evening | ORAL | Status: DC
Start: 2013-05-18 — End: 2013-05-25

## 2013-05-19 MED ORDER — TAMSULOSIN 0.4 MG CAPSULE
0.4000 mg | ORAL_CAPSULE | Freq: Every evening | ORAL | Status: DC
Start: 2013-05-18 — End: 2013-05-25

## 2013-05-25 ENCOUNTER — Ambulatory Visit (INDEPENDENT_AMBULATORY_CARE_PROVIDER_SITE_OTHER): Payer: MEDICAID

## 2013-05-25 ENCOUNTER — Encounter (INDEPENDENT_AMBULATORY_CARE_PROVIDER_SITE_OTHER): Payer: Self-pay

## 2013-05-25 ENCOUNTER — Telehealth (INDEPENDENT_AMBULATORY_CARE_PROVIDER_SITE_OTHER): Payer: Self-pay

## 2013-05-25 ENCOUNTER — Other Ambulatory Visit (INDEPENDENT_AMBULATORY_CARE_PROVIDER_SITE_OTHER): Payer: Self-pay

## 2013-05-25 VITALS — BP 124/64 | HR 68 | Temp 98.2°F | Resp 14 | Ht 72.0 in | Wt 211.0 lb

## 2013-05-25 DIAGNOSIS — Z7901 Long term (current) use of anticoagulants: Secondary | ICD-10-CM

## 2013-05-25 DIAGNOSIS — Q231 Congenital insufficiency of aortic valve: Secondary | ICD-10-CM

## 2013-05-25 DIAGNOSIS — G40909 Epilepsy, unspecified, not intractable, without status epilepticus: Secondary | ICD-10-CM

## 2013-05-25 DIAGNOSIS — Z01818 Encounter for other preprocedural examination: Secondary | ICD-10-CM

## 2013-05-25 DIAGNOSIS — Z5181 Encounter for therapeutic drug level monitoring: Secondary | ICD-10-CM

## 2013-05-25 LAB — POCT EAST PROTHROMBIN TIME (AMB)
INR: 2.6
PROTHROMBIN TIME: 25.7

## 2013-05-25 MED ORDER — ENOXAPARIN 100 MG/ML SUBCUTANEOUS SYRINGE
100.00 mg | INJECTION | Freq: Two times a day (BID) | SUBCUTANEOUS | Status: DC
Start: 2013-05-25 — End: 2013-06-28

## 2013-05-25 NOTE — Telephone Encounter (Signed)
[facility] Pt states they no longer use it. RX's need to go to [facility]. Please advise. Thank you    [employer]  Registration Specialist

## 2013-05-25 NOTE — Progress Notes (Signed)
05/25/13 1500   [facility] PT/INR   Date INR drawn 05/25/13   PT 25.7   INR 2.6   Initials rw

## 2013-05-25 NOTE — Telephone Encounter (Signed)
PA form faxed to [facility] for lovenox. Pending response.

## 2013-05-25 NOTE — Progress Notes (Signed)
BP 124/64   Pulse 68   Temp(Src) 36.8 C (98.2 F) (Oral)   Resp 14   Ht 1.829 m (6')   Wt 95.709 kg (211 lb)   BMI 28.61 kg/m2

## 2013-05-25 NOTE — Progress Notes (Addendum)
[name]  05/25/2013    Chief Complaint   Patient presents with    Anticoagulation       Subjective:  63 y.o. male in clinic today for PT/INR check. 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 Patient will be going for frontal lobectomy surgery on 2/17 at [facility] for severe seizures.     Missed doses: No  Excessive bleeding/bruising or signs/symptoms of blood clots: No   Dietary changes or recent additions of any OTC medications or supplements: No  Recent infections: No   Current Coumadin dose: 10 mg x 4 days per week and 7.5 mg on all other days      Objective:  Blood pressure 124/64, pulse 68, temperature 36.8 C (98.2 F), temperature source Oral, resp. rate 14, height 1.829 m (6'), weight 95.709 kg (211 lb).  Physical Exam:  General: No acute distress.   Heart: RRR. No murmurs, gallops, or rubs.   Lungs: Clear to auscultation bilaterally.  Abdomen: Soft. Nontender. Nondistended. Active BS in all quadrants.   Extremities: No edema.       Date INR drawn: 05/25/13  PT: 25.7  INR: 2.6    Assessment/Plan:  1. Anticoagulation goal of INR 2 to 3  - INR therapeutic.    - As he will be going to surgery on 2/17, will need to stop the Coumadin 5 d prior to surgery starting tomorrow (2/11) and will start tx dose Lovenox 100 mg SQ BID at that time as well. To stop the Lovenox 24 hours prior to the surgery.   - Will place order for PT/INR check the day prior to his surgery.   - Will defer to neurosurgery on post op anticoagulation management.   - will get preop labs  Orders Placed This Encounter    CBC    COMPREHENSIVE METABOLIC PROFILE - [facility] ONLY    PTT (PARTIAL THROMBOPLASTIN TIME)    PT/INR    POCT EAST PROTHROMBIN TIME (AMB)    enoxaparin (LOVENOX) 100 mg/mL Subcutaneous Syringe       Follow up in 2 weeks.       Marlena Clipper. Grant Ruts, M.D.  Resident, [name] saw and examined the patient and discussed management with the resident.  I reviewed the resident's note and agree with the documented findings and plan of care: waiting on the preop clearance paperwork from [name], wife to call again tomorrow     Valarie Merino, DO 05/25/2013 22:13

## 2013-05-27 NOTE — Telephone Encounter (Signed)
Approved per [facility].

## 2013-05-31 NOTE — Telephone Encounter (Signed)
Please see below message. Thank you.    Hol[employer] representative

## 2013-06-01 MED ORDER — LEVETIRACETAM 500 MG TABLET
2000.0000 mg | ORAL_TABLET | Freq: Two times a day (BID) | ORAL | Status: DC
Start: 2013-05-25 — End: 2013-09-26

## 2013-06-01 MED ORDER — TAMSULOSIN 0.4 MG CAPSULE
0.4000 mg | ORAL_CAPSULE | Freq: Every evening | ORAL | Status: DC
Start: 2013-05-25 — End: 2014-01-07

## 2013-06-01 MED ORDER — OXCARBAZEPINE 600 MG TABLET
600.0000 mg | ORAL_TABLET | Freq: Two times a day (BID) | ORAL | Status: DC
Start: 2013-05-25 — End: 2013-11-30

## 2013-06-01 MED ORDER — ESCITALOPRAM 10 MG TABLET
10.0000 mg | ORAL_TABLET | Freq: Every day | ORAL | Status: DC
Start: 2013-05-25 — End: 2013-06-28

## 2013-06-01 MED ORDER — WARFARIN 7.5 MG TABLET
7.5000 mg | ORAL_TABLET | Freq: Every evening | ORAL | Status: DC
Start: 2013-05-25 — End: 2013-07-01

## 2013-06-01 NOTE — Telephone Encounter (Signed)
Meds were e-scripted to [facility].

## 2013-06-11 ENCOUNTER — Encounter (INDEPENDENT_AMBULATORY_CARE_PROVIDER_SITE_OTHER): Payer: MEDICAID

## 2013-06-28 ENCOUNTER — Encounter (INDEPENDENT_AMBULATORY_CARE_PROVIDER_SITE_OTHER): Payer: Self-pay

## 2013-06-28 ENCOUNTER — Ambulatory Visit
Admission: RE | Admit: 2013-06-28 | Discharge: 2013-06-28 | Disposition: A | Discharge status: Home / Self Care | Payer: MEDICAID | Attending: Family Medicine | Admitting: Family Medicine

## 2013-06-28 ENCOUNTER — Ambulatory Visit (INDEPENDENT_AMBULATORY_CARE_PROVIDER_SITE_OTHER): Payer: MEDICAID

## 2013-06-28 VITALS — BP 122/80 | HR 76 | Temp 98.1°F | Resp 16 | Ht 70.0 in | Wt 198.0 lb

## 2013-06-28 DIAGNOSIS — Z9089 Acquired absence of other organs: Secondary | ICD-10-CM

## 2013-06-28 DIAGNOSIS — F411 Generalized anxiety disorder: Secondary | ICD-10-CM

## 2013-06-28 DIAGNOSIS — F22 Delusional disorders: Secondary | ICD-10-CM

## 2013-06-28 DIAGNOSIS — G40909 Epilepsy, unspecified, not intractable, without status epilepticus: Secondary | ICD-10-CM

## 2013-06-28 DIAGNOSIS — Z9889 Other specified postprocedural states: Secondary | ICD-10-CM

## 2013-06-28 DIAGNOSIS — Z7901 Long term (current) use of anticoagulants: Principal | ICD-10-CM

## 2013-06-28 DIAGNOSIS — Z5181 Encounter for therapeutic drug level monitoring: Secondary | ICD-10-CM | POA: Insufficient documentation

## 2013-06-28 DIAGNOSIS — Q231 Congenital insufficiency of aortic valve: Secondary | ICD-10-CM

## 2013-06-28 DIAGNOSIS — F419 Anxiety disorder, unspecified: Secondary | ICD-10-CM

## 2013-06-28 DIAGNOSIS — G4700 Insomnia, unspecified: Secondary | ICD-10-CM

## 2013-06-28 HISTORY — DX: Acquired absence of other organs: Z90.89

## 2013-06-28 LAB — PT/INR
INR: 2.16
PROTHROMBIN TIME: 24.1 s — ABNORMAL HIGH (ref 9.2–12.3)

## 2013-06-28 MED ORDER — ESCITALOPRAM 20 MG TABLET
20.0000 mg | ORAL_TABLET | Freq: Every day | ORAL | Status: DC
Start: 2013-06-28 — End: 2014-03-09

## 2013-06-28 NOTE — Progress Notes (Signed)
PT/INR done in office but the result stated Clot Time Too Short. I spoke with Dr. Darliss Cheney and she stated that she would send him to the lab for testing.   BP 122/80   Pulse 76   Temp(Src) 36.7 C (98.1 F) (Oral)   Resp 16   Ht 1.778 m (5\' 10" )   Wt 89.812 kg (198 lb)   BMI 28.41 kg/m2  Derek Laughter L. Karsten Howry, LPN  06/23/2160, 44:69

## 2013-06-28 NOTE — Progress Notes (Signed)
[facility] FOLLOW-UP VISIT    SUBJECTIVE:  [name] is a 63 y.o. male with a chief complaint of No chief complaint on file.  Presents for an INR check  He is 4 weeks s/p frontal lobectomy at [facility] for severe recurrent seizures  He has a mechanical aortic valve with an INR goal of 2-3.  Most recent Coumadin dose: 10 mg x 4 days per week and 7.5 mg on all other days  He restarted his coumadin a couple of weeks ago on the above dose  INR 1.67 on date of discharge 06/23/13 (was hospitalized for 21 days).   Here today with his daughter.  He lives with his wife (who is bipolar and an alcoholic) and his daughter lives 4 hrs away.    OTHER ISSUES:  He would also like to address Anxiety, Insomnia, Depression  INSOMNIA  Anxious, paranoid, popping out of bed every 10 min.  Anxiety --> insomnia --> paranoia  Wife has been unable to sleep too, so her caretaking is further limited. Daughter down to help.  The severity of these things is new since the frontal lobectomy    SEIZURES  Had one seizure coming out of anaesthesia but none since lobectomy.  Initial speech issues, and then delirium - was in ICU for 2 weeks - seroquel helpful  Starting to be able to use his smart phone again, and was able to remember how to get to [facility] today which is great   However, reports "I'm still really lost on a lot of the scenarios."  Sees different doctor here every single time which adds to his confusion. "And I see so many specialists."      MED LIST:  Lexapro 10 mg  flomax 0.4mg  daily  Keppra 1,000 BID  Trileptal 1200 BID  Oxycodone 5 mg q 4-8 hrs prn (1-2 per day for headaches)  Coumadin    EXAM:  There were no vitals taken for this visit.  GEN: A&O, in NAD  PSYCH: Appropriate mood and broad affect. Thoughts disorganized at times as having issues with staying on topic and sometimes word-finding difficulties, well groomed and appropriately dressed. Moderate eye contact. Insight fair.  CV: RRR, III/VI SEM at LUSB with s2  click.  LUNGS: CTAB no w/r/r  NEURO: UE muscle strength +2 b/l finger, elbow and shoulder flexion.  CN2-12 grossly intact. No facial asymmetry noted.      ASSESSMENT and PLAN:  ANXIETY, INSOMNIA - Increase lexapro to 20 mg daily.  Increase melatonin to max dose of 6mg  1 hr before bedtime. F/U in 3 weeks. Consider seroquel qhs if adjunct required. Check QTc prior to Rx.  MECHANICAL AORTIC VALVE - Will need to check INR via venous draw (UTO via POCT today) [phone] home # for INR f/u.  INR 2.16 - will continue current dosing. Repeat in 3 weeks.   SEIZURES, s/p FRONTAL LOBECTOMY - Established with OT and speech. Needs to have consistent PCP - recommended Dr. Radene Knee or myself.  Pt agrees.    [name], DO 06/28/2013, 08:27        Visit Diagnoses and Associated Orders, Previous Medication List, Allergies -- Summary:      No diagnosis found.        Medications:  Current Outpatient Prescriptions   Medication Sig    enoxaparin (LOVENOX) 100 mg/mL Subcutaneous Syringe 1 mL (100 mg total) by Subcutaneous route Every 12 hours    escitalopram oxalate (LEXAPRO) 10 mg Oral Tablet Take  1 Tab (10 mg total) by mouth Once a day    levETIRAcetam (KEPPRA) 500 mg Oral Tablet Take 4 Tabs (2,000 mg total) by mouth Twice daily    OXcarbazepine (TRILEPTAL) 600 mg Oral Tablet Take 1 Tab (600 mg total) by mouth Twice daily    tamsulosin (FLOMAX) 0.4 mg Oral Capsule, Sust. Release 24 hr Take 1 Cap (0.4 mg total) by mouth Every evening after dinner    warfarin (COUMADIN) 7.5 mg Oral Tablet Take 1 Tab (7.5 mg total) by mouth Every evening        Allergies:  Allergies   Allergen Reactions    Morphine  Other Adverse Reaction (Add comment)     combative

## 2013-06-30 ENCOUNTER — Emergency Department (HOSPITAL_BASED_OUTPATIENT_CLINIC_OR_DEPARTMENT_OTHER)
Admission: EM | Admit: 2013-06-30 | Discharge: 2013-07-01 | Disposition: A | Discharge status: Other Acute Inpatient Hospital | Payer: MEDICAID | Source: Home / Self Care | Attending: Emergency Medicine | Admitting: Emergency Medicine

## 2013-06-30 ENCOUNTER — Encounter (INDEPENDENT_AMBULATORY_CARE_PROVIDER_SITE_OTHER): Payer: Self-pay | Admitting: Physician Assistant

## 2013-06-30 ENCOUNTER — Ambulatory Visit (INDEPENDENT_AMBULATORY_CARE_PROVIDER_SITE_OTHER): Payer: MEDICAID | Admitting: Physician Assistant

## 2013-06-30 ENCOUNTER — Encounter (HOSPITAL_BASED_OUTPATIENT_CLINIC_OR_DEPARTMENT_OTHER): Payer: Self-pay

## 2013-06-30 VITALS — BP 124/78 | HR 72 | Temp 98.4°F | Resp 16 | Ht 70.0 in | Wt 204.0 lb

## 2013-06-30 DIAGNOSIS — R4689 Other symptoms and signs involving appearance and behavior: Secondary | ICD-10-CM

## 2013-06-30 DIAGNOSIS — R4182 Altered mental status, unspecified: Secondary | ICD-10-CM | POA: Insufficient documentation

## 2013-06-30 DIAGNOSIS — F603 Borderline personality disorder: Secondary | ICD-10-CM | POA: Insufficient documentation

## 2013-06-30 DIAGNOSIS — Z87891 Personal history of nicotine dependence: Secondary | ICD-10-CM | POA: Insufficient documentation

## 2013-06-30 DIAGNOSIS — F329 Major depressive disorder, single episode, unspecified: Principal | ICD-10-CM | POA: Diagnosis present

## 2013-06-30 DIAGNOSIS — G4700 Insomnia, unspecified: Secondary | ICD-10-CM

## 2013-06-30 DIAGNOSIS — G40909 Epilepsy, unspecified, not intractable, without status epilepticus: Secondary | ICD-10-CM | POA: Diagnosis present

## 2013-06-30 DIAGNOSIS — Z7901 Long term (current) use of anticoagulants: Secondary | ICD-10-CM

## 2013-06-30 DIAGNOSIS — F3289 Other specified depressive episodes: Principal | ICD-10-CM | POA: Diagnosis present

## 2013-06-30 DIAGNOSIS — F411 Generalized anxiety disorder: Secondary | ICD-10-CM | POA: Diagnosis present

## 2013-06-30 DIAGNOSIS — Z029 Encounter for administrative examinations, unspecified: Secondary | ICD-10-CM

## 2013-06-30 DIAGNOSIS — Z954 Presence of other heart-valve replacement: Secondary | ICD-10-CM

## 2013-06-30 DIAGNOSIS — B1920 Unspecified viral hepatitis C without hepatic coma: Secondary | ICD-10-CM | POA: Diagnosis present

## 2013-06-30 NOTE — ED Nurses Note (Signed)
Patient had brain surgery 06/01/13 patient now acting out and is paranoid.

## 2013-06-30 NOTE — Progress Notes (Signed)
BP 124/78   Pulse 72   Temp(Src) 36.9 C (98.4 F) (Oral)   Resp 16   Ht 1.778 m (5\' 10" )   Wt 92.534 kg (204 lb)   BMI 29.27 kg/m2  Michale Weikel L. Felis Quillin, LPN  8/46/9629, 52:84

## 2013-07-01 ENCOUNTER — Other Ambulatory Visit (HOSPITAL_BASED_OUTPATIENT_CLINIC_OR_DEPARTMENT_OTHER): Payer: Self-pay

## 2013-07-01 ENCOUNTER — Emergency Department (HOSPITAL_BASED_OUTPATIENT_CLINIC_OR_DEPARTMENT_OTHER)
Admission: RE | Admit: 2013-07-01 | Discharge: 2013-07-01 | Disposition: A | Discharge status: Home / Self Care | Payer: MEDICAID | Source: Ambulatory Visit

## 2013-07-01 ENCOUNTER — Inpatient Hospital Stay
Admission: RE | Admit: 2013-07-01 | Discharge: 2013-07-04 | DRG: 881 | Disposition: A | Discharge status: Home Health | Payer: MEDICAID

## 2013-07-01 DIAGNOSIS — Z029 Encounter for administrative examinations, unspecified: Secondary | ICD-10-CM

## 2013-07-01 LAB — DRUG SCREEN,URINE - BMC/JMC ONLY
AMPHETAMINE: NEGATIVE
BARBITURATES: NEGATIVE
BENZODIAZEPINES: NEGATIVE
COCAINE: NEGATIVE
COCAINE: NEGATIVE
MARIJUANA: NEGATIVE
METHADONE: NEGATIVE
OPIATES: NEGATIVE
OXYCODONE, URINE: POSITIVE — AB
PHENCYCLIDINE, URINE: NEGATIVE
TRICYCLIC SCREEN: POSITIVE — AB
TRICYCLIC SCREEN: POSITIVE — AB
URINE DRUG SCREEN COMMENT: 0

## 2013-07-01 LAB — URINALYSIS (ROUTINE) - BMC ONLY
BILIRUBIN,URINE: NEGATIVE mg/dL
GLUCOSE, URINE: NEGATIVE mg/dL
GLUCOSE, URINE: NEGATIVE mg/dL
KETONES,URINE: NEGATIVE mg/dL
LEUKOCYTE ESTERASE,URINE: NEGATIVE LEU/uL
NITRITES,URINE: NEGATIVE
PH,URINE: 6 (ref <8.0)
PROTEIN, URINE, RANDOM: NEGATIVE mg/dL
SPECIFIC GRAVITY,URINE: 1.025 — AB (ref <1.022)
UROBILINOGEN, URINE: <2 mg/dL (ref <2.0)

## 2013-07-01 LAB — ACETAMINOPHEN LEVEL: ACETAMINOPHEN: <10 ug/mL — ABNORMAL LOW (ref 10–30)

## 2013-07-01 LAB — COMPREHENSIVE METABOLIC PROFILE - BMC/JMC ONLY
ALBUMIN/GLOBULIN RATIO: 1.1
ALBUMIN: 3.4 g/dL (ref 3.2–5.0)
ALKALINE PHOSPHATASE: 150 IU/L — ABNORMAL HIGH (ref 35–120)
ALT (SGPT): 46 IU/L (ref 0–63)
ANION GAP: 6 mmol/L (ref 3–11)
AST (SGOT): 57 IU/L — ABNORMAL HIGH (ref 0–45)
AST (SGOT): 57 IU/L — ABNORMAL HIGH (ref 0–45)
BILIRUBIN, TOTAL: 0.7 mg/dL (ref 0.0–1.3)
BUN: 21 mg/dL (ref 6–22)
CALCIUM: 8.8 mg/dL (ref 8.5–10.5)
CARBON DIOXIDE: 30 mmol/L (ref 22–32)
CHLORIDE: 99 mmol/L — ABNORMAL LOW (ref 101–111)
CREATININE: 0.81 mg/dL (ref 0.72–1.30)
ESTIMATED GLOMERULAR FILTRATION RATE: >60 mL/min (ref >60)
GLUCOSE: 101 mg/dL (ref 70–110)
POTASSIUM: 3.7 mmol/L (ref 3.5–5.0)
SODIUM: 135 mmol/L — ABNORMAL LOW (ref 136–145)
TOTAL PROTEIN: 6.3 g/dL (ref 6.0–8.0)
TOTAL PROTEIN: 6.3 g/dL (ref 6.0–8.0)

## 2013-07-01 LAB — CBC
BASOPHIL #: 0.1 K/uL (ref 0.0–0.1)
BASOPHILS %: 1.1 % (ref 0.0–2.5)
EOSINOPHIL #: 0.2 10*3/uL (ref 0.0–0.5)
EOSINOPHIL #: 0.2 10*3/uL (ref 0.0–0.5)
EOSINOPHIL %: 3.6 % (ref 0.0–5.2)
HCT: 35.8 % — ABNORMAL LOW (ref 40.0–50.0)
HGB: 12.2 g/dL — ABNORMAL LOW (ref 13.5–18.0)
LYMPHOCYTE #: 1.7 K/uL (ref 0.7–3.2)
LYMPHOCYTE %: 24.4 % (ref 15.0–43.0)
MCH: 31.4 pg (ref 28.3–34.3)
MCHC: 34.1 g/dL (ref 32.0–36.0)
MCV: 92.1 fL (ref 82.0–97.0)
MONOCYTE #: 0.7 K/uL (ref 0.2–0.9)
MONOCYTE %: 10 % (ref 4.8–12.0)
MPV: 8.9 fL (ref 7.4–10.5)
NRBC ABSOLUTE: 0.01 K/uL (ref 0–0.02)
NRBC: 0.1 /100{WBCs} (ref 0–0.6)
PLATELET COUNT: 164 K/uL (ref 150–450)
PMN #: 4.3 10*3/uL (ref 1.5–6.5)
PMN %: 60.9 % (ref 43.0–76.0)
RBC: 3.89 M/uL — ABNORMAL LOW (ref 4.40–5.80)
RDW: 15.1 % — ABNORMAL HIGH (ref 10.5–14.5)
WBC: 7 10*3/uL (ref 4.0–11.0)

## 2013-07-01 LAB — TSH,3RD GENERATION
TSH 3RD GENERATION: 0.919 u[IU]/mL (ref 0.340–5.600)
TSH 3RD GENERATION: 0.919 u[IU]/mL (ref 0.340–5.600)

## 2013-07-01 LAB — SALICYLATE LEVEL: SALICYLATE: <4 mg/dL (ref <4.0)

## 2013-07-01 LAB — ETHANOL, SERUM/PLASMA: ETHANOL, SERUM: <10 mg/dL (ref <10)

## 2013-07-01 MED ORDER — IBUPROFEN 600 MG TABLET
600.0000 mg | ORAL_TABLET | Freq: Three times a day (TID) | ORAL | Status: DC | PRN
Start: 2013-07-01 — End: 2013-07-04
  Administered 2013-07-01 – 2013-07-02 (×2): 600 mg via ORAL
  Filled 2013-07-01 (×2): qty 1

## 2013-07-01 MED ORDER — HALOPERIDOL 5 MG TABLET
5.0000 mg | ORAL_TABLET | Freq: Four times a day (QID) | ORAL | Status: DC | PRN
Start: 2013-07-01 — End: 2013-07-04

## 2013-07-01 MED ORDER — OXYCODONE 5 MG TABLET
5.00 mg | ORAL_TABLET | ORAL | Status: DC | PRN
Start: 2013-07-01 — End: 2013-07-04
  Administered 2013-07-01 – 2013-07-02 (×3): 5 mg via ORAL
  Filled 2013-07-01 (×3): qty 1

## 2013-07-01 MED ORDER — OLANZAPINE 10 MG INTRAMUSCULAR SOLUTION
10.0000 mg | Freq: Four times a day (QID) | INTRAMUSCULAR | Status: DC | PRN
Start: 2013-07-01 — End: 2013-07-04

## 2013-07-01 MED ORDER — OLANZAPINE 10 MG DISINTEGRATING TABLET
10.0000 mg | ORAL_TABLET | Freq: Four times a day (QID) | ORAL | Status: DC | PRN
Start: 2013-07-01 — End: 2013-07-04

## 2013-07-01 MED ORDER — OXCARBAZEPINE 600 MG TABLET
600.00 mg | ORAL_TABLET | Freq: Two times a day (BID) | ORAL | Status: DC
Start: 2013-07-01 — End: 2013-07-04
  Administered 2013-07-01 – 2013-07-04 (×6): 600 mg via ORAL
  Filled 2013-07-01 (×8): qty 1

## 2013-07-01 MED ORDER — LORAZEPAM 2 MG/ML INJECTION SOLUTION
2.0000 mg | Freq: Four times a day (QID) | INTRAMUSCULAR | Status: DC | PRN
Start: 2013-07-01 — End: 2013-07-04

## 2013-07-01 MED ORDER — IOPAMIDOL 370 MG IODINE/ML (76 %) INTRAVENOUS SOLUTION
100.00 mL | INTRAVENOUS | Status: DC
Start: 2013-07-01 — End: 2013-07-01

## 2013-07-01 MED ORDER — HALOPERIDOL LACTATE 5 MG/ML INJECTION SOLUTION
5.0000 mg | Freq: Four times a day (QID) | INTRAMUSCULAR | Status: DC | PRN
Start: 2013-07-01 — End: 2013-07-04

## 2013-07-01 MED ORDER — ESCITALOPRAM 10 MG TABLET
20.0000 mg | ORAL_TABLET | Freq: Every day | ORAL | Status: DC
Start: 2013-07-01 — End: 2013-07-04
  Administered 2013-07-01 – 2013-07-04 (×4): 20 mg via ORAL
  Filled 2013-07-01 (×4): qty 2

## 2013-07-01 MED ORDER — LEVETIRACETAM 500 MG TABLET
2000.00 mg | ORAL_TABLET | Freq: Two times a day (BID) | ORAL | Status: DC
Start: 2013-07-01 — End: 2013-07-04
  Administered 2013-07-01 – 2013-07-04 (×6): 2000 mg via ORAL
  Filled 2013-07-01 (×8): qty 4

## 2013-07-01 MED ORDER — ACETAMINOPHEN 325 MG TABLET
650.0000 mg | ORAL_TABLET | ORAL | Status: DC | PRN
Start: 2013-07-01 — End: 2013-07-04

## 2013-07-01 MED ORDER — TAMSULOSIN 0.4 MG CAPSULE
0.40 mg | ORAL_CAPSULE | Freq: Every evening | ORAL | Status: DC
Start: 2013-07-01 — End: 2013-07-04
  Administered 2013-07-01 – 2013-07-03 (×3): 0.4 mg via ORAL
  Filled 2013-07-01 (×4): qty 1

## 2013-07-01 MED ORDER — WARFARIN 7.5 MG TABLET
7.50 mg | ORAL_TABLET | ORAL | Status: DC
Start: 2013-07-02 — End: 2013-07-04
  Administered 2013-07-02 – 2013-07-03 (×2): 7.5 mg via ORAL
  Filled 2013-07-01 (×2): qty 1

## 2013-07-01 MED ORDER — ALUMINUM-MAG HYDROXIDE-SIMETHICONE 200 MG-200 MG-20 MG/5 ML ORAL SUSP
30.0000 mL | ORAL | Status: DC | PRN
Start: 2013-07-01 — End: 2013-07-04

## 2013-07-01 MED ORDER — TRAZODONE 50 MG TABLET
50.0000 mg | ORAL_TABLET | Freq: Every evening | ORAL | Status: DC | PRN
Start: 2013-07-01 — End: 2013-07-04
  Administered 2013-07-02 – 2013-07-03 (×3): 50 mg via ORAL
  Filled 2013-07-01 (×3): qty 1

## 2013-07-01 MED ORDER — WARFARIN 10 MG TABLET
10.00 mg | ORAL_TABLET | ORAL | Status: DC
Start: 2013-07-01 — End: 2013-07-04
  Administered 2013-07-01: 10 mg via ORAL
  Filled 2013-07-01 (×2): qty 1

## 2013-07-01 MED ORDER — LORAZEPAM 2 MG TABLET
2.0000 mg | ORAL_TABLET | Freq: Four times a day (QID) | ORAL | Status: DC | PRN
Start: 2013-07-01 — End: 2013-07-04

## 2013-07-01 MED ORDER — MAGNESIUM HYDROXIDE 400 MG/5 ML ORAL SUSPENSION
30.0000 mL | Freq: Every evening | ORAL | Status: DC | PRN
Start: 2013-07-01 — End: 2013-07-04

## 2013-07-01 MED ADMIN — sodium chloride 0.9 % intravenous solution: ORAL | @ 18:00:00 | NDC 00338004904

## 2013-07-01 NOTE — Nurses Notes (Signed)
Patient transported to CT via wheelchair for a head scan per order. Accompanied by this RN, patient tolerated well, no issues. Patient verbalized that he would like to have his hearing aids, request relayed to primary nurse who will ask the daughter to bring them in. [name] is now resting in his room.

## 2013-07-01 NOTE — Ancillary Notes (Signed)
This CRW was notified about the pt, and informed the nurse, Randall Hiss, that she was currently handling an admission and an issue in another room.  This CRW stated she would be there as soon as possible.
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
## 2013-07-01 NOTE — Consults (Signed)
Current Complaint: Pt states he had seizure surgery in Feb, and thought everything was ok.  Pt states his brain isnt' working right.  Pt states he is confused, insomnia, paranoia, and anxious. Pt states when "nobody was watching me, I felt really afraid."  Pt states he can't sleep at night and is walking constantly.     Marital Status:  Married    Diagnostic Impression:  Depression  Anxious  Paranoid Ideations    Disposition:  This CRW will completed bedside hand off to next CRW, Maggie.  Burman Nieves will review the case with the on call psychiatrist.       Pt and family are wanting to pursue inpatient psychiatric treatment.

## 2013-07-01 NOTE — Ancillary Notes (Signed)
At the request of Dr. Trenton Gammon, this CRW has reviewed the case with the psychiatrist, Dr. Roque Cash.  Dr. Roque Cash stated that as long as the EPS assessment coincides with the pt's chart documentation, the pt will be accepted, but it depends solely on the EPS assessment information.

## 2013-07-01 NOTE — ED Nurses Note (Signed)
Pt returned to ED to room 19 with EMS staff.

## 2013-07-01 NOTE — Ancillary Notes (Signed)
This CRW has made contact with the pt's daughter, Hollie Beach, that an ambulance is on their way and will be there in 40 minutes. Ashleigh verbalized her understanding.

## 2013-07-01 NOTE — Consults (Signed)
This CRW came to see the pt, and pt and family had eloped.    This CRW informed pt's ED nurse and charge nurse.
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
## 2013-07-01 NOTE — H&P (Signed)
[facility]  [place], [place] [id number]    Psychiatric Admission H&P    [name],[name], 63 y.o. male  Date of Admission:  07/01/2013  Date of Birth:  06/16/1950  PCP:  Caroline MoreAndrew Seungik Chang, MD    Information Obtained from: patient  ID: 63 y.o male S/P lobectomy for epilepsy 1 month ago admitted via ED d/t confusion & family unable to manage @ home.  Chief Complaint:  Of note I have not yet spoken to family to obtain auxiliary hx so hx is from pt & review of ED and HF records.  Pt claims he is here bc family has been looking for brain rehab for several months and has not been able to get help.    HPI/Discussion:   Current symptoms: Says has been "confused" since surgery w/"brain fog," racing thoughts, "mind flipping/spinning."  Also says is depressed bc he had had such high hopes that the surgery was going to improve his life dramatically and he has had a difficult convalescence so far.  Says told by doctors in HF that his best option for obtaining brain injury rehab was to get admitted psychiatrically and told was sent to [place] for this purpose.  Main c/o is insomnia, says he wanders house @ night and that wife (who pt says has bipolar and alcoholism) unable to deal w/that as it is disruptive to her schedule.    Stressors and triggers:  See under current sxs above.  Past Episodes:  Denies.  Patient assets:  Appears knowledgeable re his illness.      PPHx:   Current outpatient psych treatment:  None.  Past outpatient psych treatment:  None.  Inpatient treatment:  Denies previous tx.  Medication trials:  Unknown.  Suicide attempts:  Denies.  Substance abuse:  Denies.    @PROBLIST[place]U @  Past Medical History   Diagnosis Date    Aortic valve replaced     Diverticulitis     Wears glasses     Abnormal EKG     Seizures     Depression     Anxiety     Rectal bleeding     Kidney stones     Viral hepatitis C     Bladder stone      Past Surgical History   Procedure Laterality Date    Hx  knee replacment Left     Hx gall bladder surgery/chole      Hx aortic valve replacement      [facility]    Hx colonoscopy       Medications Prior to Admission    Outpatient Medications    escitalopram oxalate (LEXAPRO) 20 mg Oral Tablet    Take 1 Tab (20 mg total) by mouth Once a day    levETIRAcetam (KEPPRA) 500 mg Oral Tablet    Take 4 Tabs (2,000 mg total) by mouth Twice daily    OXcarbazepine (TRILEPTAL) 600 mg Oral Tablet    Take 1 Tab (600 mg total) by mouth Twice daily    oxyCODONE (ROXICODONE) 5 mg Oral Tablet    Take 5 mg by mouth Every 4 hours as needed for Pain (every 4-8 hours)    tamsulosin (FLOMAX) 0.4 mg Oral Capsule, Sust. Release 24 hr    Take 1 Cap (0.4 mg total) by mouth Every evening after dinner    warfarin (COUMADIN) 10 mg Oral Tablet    Take 10 mg by  mouth Every Sunday, Tuesday and Thursday At HS    warfarin (COUMADIN) 7.5 mg Oral Tablet    Take 7.5 mg by mouth Every Monday, Wednesday, Friday and Saturday At HS    warfarin (COUMADIN) 7.5 mg Oral Tablet    Take 1 Tab (7.5 mg total) by mouth Every evening          Current Facility-Administered Medications:  acetaminophen (TYLENOL) tablet 650 mg Oral Q4H PRN   aluminum-magnesium hydroxide-simethicone (MAG-AL PLUS) 200-200-20 mg per 5 mL oral liquid 30 mL Oral Q4H PRN   escitalopram (LEXAPRO) tablet 20 mg Oral Daily   haloperidol (HALDOL) tablet 5 mg Oral Q6H PRN   Or      haloperidol (HALDOL) 5 mg/mL injection 5 mg Intramuscular Q6H PRN   ibuprofen (MOTRIN) tablet 600 mg Oral Q8H PRN   levETIRAcetam (KEPPRA) tablet 2,000 mg Oral 2x/day   LORazepam (ATIVAN) tablet 2 mg Oral Q6H PRN   Or      LORazepam (ATIVAN) 2 mg/mL injection 2 mg Intramuscular Q6H PRN   magnesium hydroxide (MILK OF MAGNESIA) 400mg  per 59mL oral liquid 30 mL Oral HS PRN   OLANZapine (ZYPREXA ZYDIS) rapid dissolve tablet 10 mg Oral Q6H PRN   Or      OLANZapine (ZYPREXA INTRAMUSCULAR) IM injection 10 mg Intramuscular Q6H PRN      OXcarbazepine (TRILEPTAL) tablet 600 mg Oral 2x/day   oxyCODONE (ROXICODONE) immediate release tablet 5 mg Oral Q4H PRN   tamsulosin (FLOMAX) capsule 0.4 mg Oral Daily after Dinner   traZODone (DESYREL) tablet 50 mg Oral HS PRN   warfarin (COUMADIN) tablet 10 mg Oral SU, TU, and TH   [START ON 07/02/2013] warfarin (COUMADIN) tablet 7.5 mg Oral M, W, F and SA     Allergies   Allergen Reactions    Morphine  Other Adverse Reaction (Add comment)     combative     History   Substance Use Topics    Smoking status: Former Smoker -- 0.50 packs/day     Quit date: 06/01/2013    Smokeless tobacco: Never Used    Alcohol Use: No       Hx of Seizures:  yes  Hx of CHI:  yes    Social History:  Lives w/wife as mentioned above under HPI.  Daughter apparently is supportive.  Family History:  Noncontributory.    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    Exam:  Temperature: 36.9 C (98.4 F)  Heart Rate: 57  BP (Non-Invasive): 138/77 mmHg  Respiratory Rate: 18  SpO2-1: 99 %  Pain Score (Numeric, Faces): 8  General:  vital signs reviewed  Eyes:  Conjunctiva clear.  HENT:  Head atraumatic and normocephalic  Neck:  PE from ED earlier today reviewed.  Lungs:    PE from ED earlier today reviewed.  Cardiovascular:     PE from ED earlier today reviewed.  Abdomen:  PE from ED earlier today reviewed.  Extremities:  No cyanosis or edema  Skin:  Skin warm and dry  Neurologic:  CN II - XII grossly intact   Lymphatics:  PE from ED earlier today reviewed.  Psychiatric:    See below under MSE.    MSE:   Appearance:  casually dressed   Behavior:  cooperative   Motor/MS:  normal gait   Speech:  normal   Memory:  grossly normal   Mood:  irritable  and dysphoric   Affect:  flat   Perception:  normal   Thought:  goal directed/coherent   Thought Content:  Confused    Suicidal Ideation:  none.     Homicidal Ideation:  none   Level of Consciousness:   alert   Orientation:  grossly normal   Judgement:  fair   Conceptual:  abstract thinking   Fund of Knowledge:  Adequate.    Labs:  I have reviewed all lab results.    Imaging Studies:  CT:   CT head w/o contrast ordered since admit to r/o bleed reported normal.    Assessment:  Pt reports that in retrospect he feels he should have gone to rehab facility directly upon discharge from neurosurgical unit; asked why this did not happen he said he had had significant improvement in above cognitive sxs for the last 2-3 days prior to his discharge from surgery and he now feels he was unrealistically optimistic re how things would go on discharge home.  However, since I have not yet had a chance to gather auxiliary hx it may be that there are other factors involved.  Definitely he should be worked up as "mental status changes" in terms of ruling out organic pathology.       Plan:  1.  Resume home Rx notably AD Rx which was increased by PCP earlier this week.  2.  Monitor closely.  3.  Neurosurgery consult has been ordered to assist in assessment of pt especially since ED differential contained possible dx of brain swelling which is not something that should be risked and likely would not be able to be acutely tx'd on a psych unit.  4.  Gather auxiliary hx.  5.  Meet w/tx team re pt and request social work assistance in placement and follow up planning.

## 2013-07-01 NOTE — ED Nurses Note (Signed)
After lengthy discussion between crisis worker, patient's family, nursing staff and physicians.  It has been arranged for [facility] to go to patient's home and transport patient back to [facility] to continue his care.

## 2013-07-01 NOTE — Consults (Addendum)
Assumed care of this patient from crisis worker, Danielle.     Pts wife, [name], is at pts bedside.     Pt PCP is [facility]. Keppra 1,000mg  BID, Trileptal 600mg  BID, Warfarin 7.5mg  every other night, Warfarin 10mg  every other night, Lexapro 20mg  QAM and FLomax .4mg  QHS. Lexapro dose was doubled a few days ago.     Pt reports feeling anxious and depressed. Pt and wife report that patient has been confused and forgetful. [name] reports that patient is paranoid that he is ill and is dying. The other morning he had a side pain, thought he was dying and was driving himself to the ER. Pt states "I'm not in control of things" saying he can't figure out the simplest things.     Pt was discharged from [facility] on 06/23/13 after a temporal lobe resection surgery.     Pt is requesting inpatient mental health treatment at this time.

## 2013-07-01 NOTE — Ancillary Notes (Signed)
This CRW attempted to call the pt and his family.  Left voicemails on numbers in the pt's chart.  Unable to make contact.

## 2013-07-01 NOTE — Ancillary Notes (Signed)
This CRW conversed with the pt's wife and daughter.  During the conversation the family was able to inform this CRW of the following information:  -"He is in a total state of depression."  -"He is not getting any sleep, and we are not getting any sleep"  -Pt's family is "only able to get a couple hours of sleep at a time for the last 7 days."  -"If he were to get angry or agitated he could over power both of [place]."  -"We are going to go to sleep, and when he wakes up... We HOPE that it wakes [place] up BEFORE he wanders out of the house into the community."  -"No we do not feel safe."  -"Yes, we are okay with him coming back in via ambulance to get treatment. We will be accompanying him."

## 2013-07-01 NOTE — Ancillary Notes (Signed)
This CRW has called in [place], [place] to assume pt's case, and perform bedside hand off.

## 2013-07-01 NOTE — ED Nurses Note (Signed)
Vital signs repeated and stable. Warm blankets and comfort measures given to patient by this nurse. Call to [facility] crisis worker who will be down in 15-20 minutes to evaluate patient.

## 2013-07-01 NOTE — Ancillary Notes (Signed)
Type of group: Process Group/Activity   Group technique: Activity and Discussion   Time group initiated: 240   Length of group time: 70 min   Patient's mental status/affect:   PT was called out before he could participate in group.  Patient's behavior:  Unable to determine  Patient's response:  Unable to determine

## 2013-07-01 NOTE — ED Nurses Note (Signed)
Report received from [facility]- patient resting with eyes closed- respirations even non labored. Order placed for meal try. awaiting admit orders at this time.

## 2013-07-01 NOTE — Ancillary Notes (Signed)
Type of group:Psychoeducational  Group technique: Discussion  Time group initiated: 1045  Length of group time: 55 mins  Patient's mental status/affect: pt did not attend group  Patient's behavior: N/A  Patient's response: N/A

## 2013-07-01 NOTE — ED Nurses Note (Signed)
Rounded on patient.  Reviewed vital signs and treatment plan.  Asked if patient had any needs, especially in the area of toileting, pain management and general comfort.  Addressed issues.  Asked the patient/family if they had any needs before I left the room.  Indicated that I would be back within the hour to evaluate them again and update them on throughput progress.  Call bell within reach.

## 2013-07-01 NOTE — Nurses Notes (Signed)
63 Year old male who arrived to unit at 0753, via wheelchair accompanied by staff. Pt states he is here due to insomnia and increased confusion. Pt had a frontal lobotomy about 4 weeks ago in [place]. At that time [facility] referred the pt to a psych facility but his family declined. Pt has been wandering at home since then and leaving the stove on and removing firearms from his home per report. Pt's family is afraid to leave the pt at home by himself due to safety concerns. Pt states he's not really depressed, just frustrated about not resting and having increased confusion. Skin assessment complete, pt has rash over abd and upper chest area, bil arms and legs and right under his butt cheeks. Pt states he has had this rash since leaving [place]. Pt was prescribed cortisone cream which he states did nothing. Physician aware. Pt has history of frontal lobotomy, diverticulitis, seizures, and aortic value replacement in 2003, and had a left knee replacement. Pt is pleasant and cooperative. No paranoia noted and pt's behavior has been appropriate. Pt denies self harm and harm to others. Denies hearing or seeing things. Pt doesn't seem to be responding to internal stimuli. No complaint of pain voiced at the moment, no signs or symptoms of distress noted. Fall precautions along with seizure precautions initiated. Physician aware that pt is on the unit and orders given. Encouraged pt if he needs assistance at any time, to contact staff. Pt agreeable.

## 2013-07-01 NOTE — ED Nurses Note (Signed)
After receiving report on Pt from Cassandria Anger, RN, I went to room to introduce myself to patient and found room to be empty.

## 2013-07-01 NOTE — ED Nurses Note (Signed)
Report to [name]- family remains at patients bedside no needs identified at this time.

## 2013-07-02 MED ADMIN — lactated Ringers intravenous solution: ORAL | @ 22:00:00 | NDC 00338011704

## 2013-07-02 MED ADMIN — clindamycin phosphate 1 % topical solution: ORAL | @ 18:00:00 | NDC 59762372801

## 2013-07-02 NOTE — Ancillary Notes (Signed)
Biopsych and Audit C were completed with the pt.    Treatment plan reviewed and signed by the pt.

## 2013-07-02 NOTE — Progress Notes (Signed)
Patient left clinic without being seen by provider.  Sheilah Mins, PA-C     Patient was having acute mental status changes, he was actively homicidal and suicidal. He didn't want to be left alone by himself b/c he didn't trust himself. He hadnt slept in over 5 days.   He was 4 weeks s/p lobectomy for severe seizure disorder  His daughter was with him, she came down from [place] to help take care of him. Per her report patients wife is an alcoholic and binges 3-4 times per week, doesn't feel like she can safely care for her father  Advised my nurse Roselyn Reef to have them proceed to [facility] for psych eval.   Offered transport by EMS, daughter declined, states she would take him straight there

## 2013-07-02 NOTE — Ancillary Notes (Signed)
Type of group: Process  Group technique: Discussion  Time group initiated: 1430  Length of group time: 60 min  Patient's mental status/affect: n/a  Patient's behavior: Patient was in bed when invited to group.  Patient's response: Patient declined.

## 2013-07-02 NOTE — Ancillary Notes (Addendum)
Pt is refusing to participate in his biopsych and Audit C.

## 2013-07-02 NOTE — Nurses Notes (Signed)
Patient up on the unit for lunch only. Patient was invited to morning groups but refused to attend. Affect is flat, eye contact is poor, dress is unkempt. Patient has walked independently in the hallways. Patient's behavior has been quiet, isolative, withdrawn, and he has had minimal interactions w/ staff and peers.  States his mood is "not too good because I hurt and I didn't sleep well." C/o L hip pain "from the snow" and ibuprofen was given w/ partial effect. Patient denies suicidal/ homicidal ideations and all hallucinations. Will continue to monitor q 15 mins for safety.

## 2013-07-02 NOTE — Nurses Notes (Signed)
Patient attended and participated in wrap-up group and snack. He did not have a goal for the day and reported that he is just trying to learn the routine here.Patient reported that he had an ok day and was in an ok mood. Patient was med compliant, denies suicidal/homocidal ideation and psyhotic symptoms. Little interaction with patients and staff. Will continue to monitor q 15 minutes for safety and provide support as needed.

## 2013-07-02 NOTE — Ancillary Notes (Signed)
Type of group: Psychoeducational   Group technique: Worksheet and Discussion   Time group initiated: 1045   Length of group time: 45 min   Patient's mental status/affect: Pt was invited to group, but did not attend.   Patient's behavior: NA   Patient's response: NA

## 2013-07-02 NOTE — Nurses Notes (Addendum)
PRN trazadone 50 mg given for complaint of insomnia.   0126:  Effective

## 2013-07-02 NOTE — Nurses Notes (Signed)
Message left on Dr. Kizzie Bane cell phone and at his office regarding consult and request for him to call Dr. Cain Sieve. Message was left yesterday in the OR for Dr. Thornton Papas, (per [facility]), but he did not call back.

## 2013-07-03 ENCOUNTER — Encounter (INDEPENDENT_AMBULATORY_CARE_PROVIDER_SITE_OTHER): Payer: Self-pay

## 2013-07-03 MED ADMIN — traZODone 50 mg tablet: ORAL | @ 20:00:00

## 2013-07-03 NOTE — Discharge Instructions (Addendum)
WHY YOU WERE HOSPITALIZED:  Your primary diagnosis was:  Depressive Disorder NOS

## 2013-07-03 NOTE — Nurses Notes (Signed)
Patient denies nausea, vomiting, and diarrhea. Patient denies visual or auditory hallucinations at this time. Patient denies pain. Patient denies suicidal or homicidal idealations. Patient took am medications at this time without any problems at this time. Nurse will offer patient emotional support and will do safety checks q 15 minutes.

## 2013-07-03 NOTE — Nurses Notes (Signed)
Pt denied thoughts of suicide/homicide, harm to self/others and auditory/visual hallucinations.  Patient continued to isolate himself but did walk the hallways.  Pt took medications and slept well all night with no issues.

## 2013-07-03 NOTE — Consults (Signed)
[facility]                                  [place], [place] [address]                                     [phone]                             CONSULTATION    PATIENT NAME: [name], [name] [facility] MIWOEH:[id number]  DATE OF SERVICE:07/03/2013  DATE OF BIRTH: Mar 13, 1951    NEUROSURGICAL CONSULTATION    REQUESTING PHYSICIAN:  Dr. August Luz.    CHIEF COMPLAINT:  Confusion.    HISTORY OF PRESENT ILLNESS:  The patient is a 63 year old male who underwent a right temporal lobectomy at [facility] about a month ago for temporal lobe seizures.  He says those have been brought under control.  He was in the hospital there for 3 weeks and was sent home.  He says when he got home, he got very confused and depressed and, therefore, he is the behavioral health unit here at [facility].    He currently does not complain of any headache, nausea, vomiting, or any seizure activity.    PHYSICAL EXAMINATION:  The patient is a brightly awake, alert, pleasant male.      HEENT:  He is normocephalic, atraumatic.  Pupils are 3 mm and brisk.  Extraocular movements are intact.      NECK:  Neck is supple with full range of motion.      LUNGS:  Clear to auscultation bilaterally.    CARDIOVASCULAR:  Regular rate and rhythm.      ABDOMEN:  Soft, nontender with normoactive bowel sounds.      EXTREMITIES:  Without clubbing, cyanosis, or edema.  Cranial nerves II-XII are intact bilaterally.  Motor, sensory, and speech exams are normal.  His incision is well-healed.    DIAGNOSES:  1.  Status post craniotomy.  2.  Confusion.  3.  Depression.  4.  History of seizures.      RECOMMENDATIONS:  He is currently neurologically intact and brightly awake.  I am not surprised that he had some confusion and depression after having had such major brain surgery.  I reviewed his CT scan.  It does not show any significant acute intracranial  abnormalities.  He has postoperative changes as expected.  He will follow up with the neurosurgeons at [facility] who did his surgery when he is discharged from here.  I have nothing further to offer him from my standpoint.      Elisabeth Most, MD      BB/[id number]; D: 07/03/2013 45:03:88; T: 07/03/2013 82:80:03

## 2013-07-03 NOTE — Ancillary Notes (Signed)
Type of group: Psychoeducational   Group technique: Stress handout and activity   Time group initiated: 10:45am-11:45am   Length of group time: 1hr  Patient's mental status/affect: Pt was invited to attend group but did not come.   Patient's behavior: N/A  Patient's response: N/A

## 2013-07-03 NOTE — Progress Notes (Signed)
[facility]  [place], [place] [address]    Behavioral Health Progress Note      [name], 63 y.o.  Date of Birth:  [birth year]  MRN:  [id number]  CSN:  [id number]    Subjective: "I don't need to be here."  Feels he has neurosurgical problem not psychiatric problem.  Says he didn't want to go to group bc it just confuses him - gives e.g that he was asked re progress made towards goals and wondered when the group was when they set their goals.  Difficult to sleep d/t noise on unit @ night.    Objective:     Pt condition today:  same  EXAM  Temperature: 36.4 C (97.5 F)  BP (Non-Invasive): 134/75 mmHg  Heart Rate: 59  Respiratory Rate: 18  Pain Score (Numeric, Faces): 0  SpO2-1: 97 %   Appearance:  casually dressed   Behavior:  cooperative   Motor/MS:  Lying in bed no distress   Speech:  normal   Mood:  dysphoric   Affect:  consistent with mood   Perception:  normal   Thought:  goal directed/coherent   Thought Content:  appropriate    Suicidal Ideation:  none.     Homicidal Ideation:  none   Level of Consciousness:  alert   Orientation:  grossly normal   Concentration:  fair   Memory:  does not appear to remember examiner   Conceptual:  abstract thinking   Judgement:  impaired   Insight:  impaired   Other objective findings:  No further findings.    Other Physical Exam:  No physical findings.    Consult Results:  No results.    Lab Results reviewed: I have reviewed all lab results.      Current Facility-Administered Medications:  acetaminophen (TYLENOL) tablet 650 mg Oral Q4H PRN   aluminum-magnesium hydroxide-simethicone (MAG-AL PLUS) 200-200-20 mg per 5 mL oral liquid 30 mL Oral Q4H PRN   escitalopram (LEXAPRO) tablet 20 mg Oral Daily   haloperidol (HALDOL) tablet 5 mg Oral Q6H PRN   Or      haloperidol (HALDOL) 5 mg/mL injection 5 mg Intramuscular Q6H PRN   ibuprofen (MOTRIN) tablet 600 mg Oral Q8H PRN   levETIRAcetam (KEPPRA) tablet 2,000 mg Oral 2x/day      LORazepam (ATIVAN) tablet 2 mg Oral Q6H PRN   Or      LORazepam (ATIVAN) 2 mg/mL injection 2 mg Intramuscular Q6H PRN   magnesium hydroxide (MILK OF MAGNESIA) 400mg  per 12mL oral liquid 30 mL Oral HS PRN   OLANZapine (ZYPREXA ZYDIS) rapid dissolve tablet 10 mg Oral Q6H PRN   Or      OLANZapine (ZYPREXA INTRAMUSCULAR) IM injection 10 mg Intramuscular Q6H PRN   OXcarbazepine (TRILEPTAL) tablet 600 mg Oral 2x/day   oxyCODONE (ROXICODONE) immediate release tablet 5 mg Oral Q4H PRN   tamsulosin (FLOMAX) capsule 0.4 mg Oral Daily after Dinner   traZODone (DESYREL) tablet 50 mg Oral HS PRN   warfarin (COUMADIN) tablet 10 mg Oral SU, TU, and TH   warfarin (COUMADIN) tablet 7.5 mg Oral M, W, F and SA       Initial Diagnosis:    PSYCH PROBLEM LIST  AXIS I: Depression  AXIS II: Deferred  AXIS III: see medical chart  AXIS IV: lack coping skills, recent brain surgery,recently retired  AXIS V: 21    Assessment: Little change.  Advised pt that healing from this type of brain surgery can  be slow and he appears to understand.    Plan:     Comments:  Continue to evaluate.  Encouraged pt to participate in groups to help w/structure and rehab.

## 2013-07-04 MED ORDER — TRAZODONE 50 MG TABLET
50.00 mg | ORAL_TABLET | Freq: Every evening | ORAL | Status: DC | PRN
Start: 2013-07-04 — End: 2013-07-16

## 2013-07-04 NOTE — Nurses Notes (Signed)
Patient discharged on 07/04/2013 at 14:56 with family. Discharge instructions given to patient/family per order. Patient/family verbalized understanding. Patient/family instructed to contact physician with any questions/concerns. Patient contracts to safety. Belongings returned to patient. After Visit Summary-Discharge instructions faxed to [facility]/ Dr. Radene Knee by Prentiss Bells, [place] on 07/04/13 at 1455.

## 2013-07-04 NOTE — Ancillary Notes (Signed)
Type of group: Process  Group technique: discussion  Time group initiated: 1045  Length of group time: 50 min  Patient's mental status/affect: n/a  Patient's behavior: Patient was invited but did not attend.  Patient's response: n/a

## 2013-07-04 NOTE — Progress Notes (Signed)
[facility]  [place], [place] [address]       Neurosurgery Consult  Follow Up Note    [name],[name], 62 y.o. male  Date of Service: 07/04/2013  Date of Birth:  [birth year]    Hospital Day:  LOS: 3 days     Chief Complaint:  confusion  Subjective: doing well    Objective:  MOTOR: 5/5 all exts  SENSORY: intact to lt touch  INCISION (if applicable):    Assessment/Recommendations:  Neurologically stable.  FU with [facility] neurosurgery when discharged

## 2013-07-04 NOTE — Nurses Notes (Addendum)
PRN trazadone 50 mg given for complaint of insomnia.   2100: Effective

## 2013-07-04 NOTE — Care Plan (Signed)
Problem: Confusion, Acute (Adult)  Goal: Safety  Patient will demonstrate the desired outcomes.   Patient shows no s/sx confusion or safety related issues.

## 2013-07-04 NOTE — Care Plan (Signed)
Problem: Confusion, Acute (Adult)  Goal: Cognitive/Functional Impairments Minimized  Patient will demonstrate the desired outcomes.   Patient is interacting w/ others more and participating in groups.

## 2013-07-04 NOTE — Nurses Notes (Signed)
Patient did not attend evening wrap up group. Isolated to his room with little interaction with staff or peers. Patient denied suicidal/homocidal ideation, denies psychotic symptoms, and no pain reported. Patient was med compliant. Will continue to monitor q 15 minutes for safety and provide support as needed.

## 2013-07-04 NOTE — Progress Notes (Signed)
S: Attempted to wake pt for interview, unable to wake, appeared sleeping comfortably.  Earlier I had observed pt out/about on unit, had visitors, no inappropriate bx.  Overheard pt making statement that he is "bored to death" here.    O: Unable to do MSE d/t pt sleeping.    A: Appears stable and unchanged.  Staff report that consultant mentioned that there is nothing to be done for pt from surgical point of view.    P: Will discuss w/team on Monday re disposition for pt given that family is unable to care for pt @ home although he has not been disruptive or bx disturbance on unit.

## 2013-07-04 NOTE — Care Plan (Signed)
Problem: Fall/Trauma/Injury Risk (Adult, Obstetrics)  Goal: Absence of Trauma/Injury/Falls  Patient will demonstrate the desired outcomes.   No falls this shift. Patient walking w/o difficulty.

## 2013-07-04 NOTE — Nurses Notes (Signed)
Patients wife came for a visit and requested he be discharged today. Patient has an early morning appointment with his neurosurgeon in [facility] tomorrow. They will attempt to find him placement in a TBI unit.  Wife feels confident taking him home and transporting him to his appointment tomorrow.

## 2013-07-04 NOTE — Nurses Notes (Signed)
Patient up on the unit for meals, meds, and groups. Affect is bright, eye contact is good, dress is appropriate and socially groomed. Patient's behavior has been calm, cooperative, friendly w/ peers. He has had appropriate interactions w/ staff and peers.  States his mood is "just tired" and he slept poorly. Patient denies suicidal/ homicidal ideations and all hallucinations. Patient attended morning group where theme for the day of "Acceptance and Denial" was discussed. Journal assignment given to think/ write about "The hardest thing I ever had to do was..."  Patient set a personal goal to "be patient and let the process work." Will continue to monitor q 15 mins for safety.

## 2013-07-06 ENCOUNTER — Other Ambulatory Visit: Payer: Self-pay

## 2013-07-06 ENCOUNTER — Ambulatory Visit
Admission: RE | Admit: 2013-07-06 | Discharge: 2013-07-06 | Disposition: A | Discharge status: Still In Hospital | Payer: MEDICAID | Source: Ambulatory Visit

## 2013-07-06 DIAGNOSIS — F3289 Other specified depressive episodes: Secondary | ICD-10-CM | POA: Insufficient documentation

## 2013-07-06 DIAGNOSIS — Z9889 Other specified postprocedural states: Secondary | ICD-10-CM | POA: Insufficient documentation

## 2013-07-06 DIAGNOSIS — Q000 Anencephaly: Secondary | ICD-10-CM | POA: Insufficient documentation

## 2013-07-06 DIAGNOSIS — R269 Unspecified abnormalities of gait and mobility: Secondary | ICD-10-CM

## 2013-07-06 DIAGNOSIS — M6281 Muscle weakness (generalized): Secondary | ICD-10-CM

## 2013-07-06 DIAGNOSIS — G40219 Localization-related (focal) (partial) symptomatic epilepsy and epileptic syndromes with complex partial seizures, intractable, without status epilepticus: Secondary | ICD-10-CM

## 2013-07-06 DIAGNOSIS — Z5189 Encounter for other specified aftercare: Secondary | ICD-10-CM | POA: Insufficient documentation

## 2013-07-06 DIAGNOSIS — F411 Generalized anxiety disorder: Secondary | ICD-10-CM | POA: Insufficient documentation

## 2013-07-06 DIAGNOSIS — Z87442 Personal history of urinary calculi: Secondary | ICD-10-CM | POA: Insufficient documentation

## 2013-07-06 DIAGNOSIS — F329 Major depressive disorder, single episode, unspecified: Secondary | ICD-10-CM | POA: Insufficient documentation

## 2013-07-06 DIAGNOSIS — Z954 Presence of other heart-valve replacement: Secondary | ICD-10-CM | POA: Insufficient documentation

## 2013-07-06 DIAGNOSIS — K5732 Diverticulitis of large intestine without perforation or abscess without bleeding: Secondary | ICD-10-CM | POA: Insufficient documentation

## 2013-07-06 DIAGNOSIS — Z96659 Presence of unspecified artificial knee joint: Secondary | ICD-10-CM | POA: Insufficient documentation

## 2013-07-06 DIAGNOSIS — G40802 Other epilepsy, not intractable, without status epilepticus: Secondary | ICD-10-CM | POA: Insufficient documentation

## 2013-07-06 NOTE — PT Treatment (Signed)
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 Koren Bound  Next Physician Follow-up Visit: 07/19/2013    07/06/2013 is the patient's 1st visit      Subjective   See initial evaluation    Objective     Therapeutic Exercise:  Treadmill   Tall bike  [facility] Stretch   [facility] Stretch     Total Treatment time:  n/a                        Assessment:   Patient is a 63 year old male who presents to initial evaluation with findings that are consistent with generalized muscle weakness secondary to s/p temporal lobe resection and removal of hippocampus on right. Patient presents with minor deconditioning and feel that this is due to non use from being bedridden with surgery. From the above findings, I feel that patient would benefit from 3 weeks of therapy for conditioning and to become independent on a home program. Good prognosis.       Goals:   STG - LTG: In 4 weeks (08/06/2013)  1. Patient (and caregivers) will be Independent with HEP.   2. Patient will be able to perform 1000 ft ambulation in a 3 MWT with an RPE of 12 for improvement in endurance.   3. Patient will present with 4+/5 myotomal strength in BLEs.   4. Patient will fill out ABC questionnaire and report 85% confidence.     Plan:     2x/week for 3 weeks for therapeutic exercise, therapeutic activity, manual therapy, gait training, neuromuscular re-education    The risks/benefits of therapy have been discussed with the patient and he/she is in agreement with the established plan of care.    Therapist :     Ginger Carne, PT 07/06/2013, 09:47

## 2013-07-06 NOTE — Speech Evaluation (Signed)
[facility]  [place], [place] [address]    Rehabilitation Services  [phone]  Speech-Language Pathology     Cognitive Linguistic Evaluation    Patient Name: [name]  Date of Birth: 14-Oct-1950  Payor: Payor: [place] MEDICAID / Plan: [place] MEDICAID / Product Type: Medicaid /   Date/Time of Admission: [phone]  Reason for Referral: Cognitive/ Linguistic Evaluation  Past Medical History   Diagnosis Date    Aortic valve replaced     Diverticulitis     Wears glasses     Abnormal EKG     Seizures     Depression     Anxiety     Rectal bleeding     Kidney stones     Viral hepatitis C     Bladder stone      Subjective:  Pt is a 62 year old male, wearing bilateral hearing aids, and wearing glasses in to session. Pt's wife joined the pt for the evaluation. Pt is s/p brain surgery to treat Epilepsy-removal of hippocampus and resection of R temporal lobe completed on 06/01/2013. No seizures post operation per pt's report. Pt was able to answer all past medical hx questions appropriately and reported feeling much better now about 1 month post op. Pt reported having severe Insomnia and pt's wife reported that he was "too much to handle," and he was recently admitted to [facility] floor for treatment. Pt's wife reported they prescribed sleeping medication and this has helped the pt substantially. Pt described his cognitive abilities to be back at baseline and pt's wife confirmed this statement. Pt's wife reported that immediately after brain surgery pt's speech was slightly slurred (no dentures in) and that cognitive deficits were slightly noticeable. Pt reported he currently completes jig saw puzzles and crossword puzzles to keep his brain engaged. Pt's wife reported she takes care of scheduling, banking, and most duties around the home. Pt used to work for [employer], but now is on Disability.    VERBAL EXPRESSION  Characteristics of  Verbal Expression: Within normal limits  Pragmatics: Within normal limits     AUDITORY COMPREHENSION  Overall Rating of Auditory Comprehension: Within normal limits   The pt. follows simple commands: Within normal limits   The pt. follows multi-step commands:Within normal limits   The pt. answers questions appropriately: Within normal limits   The pt. can participate in conversation:Within normal limits   Comment on Auditory Comprehension Characteristics: Within normal limits     VOICE  Voice Characteristics: Within normal limits   Intelligibility Characteristics: Within normal limits       ORAL MOTOR EVALUATION   Dysarthria Present: NO  Dysphagia Present: Pt denies any s/s of dysphagia    STANDARDIZED TESTS  The Cognitive Linguistic Quick Test (CLQT) was administered on 07/06/2013.   This standardized test is norm referenced for persons aged 18-69 and 41-89. Subtests include personal facts, symbol cancellation, confrontation naming, clock drawing, story retelling, symbol trials, generative naming, design memory, mazes, design generation. These subtests collectively evaluate the patient's attention, memory, executive functions, language, and visual spatial skills.  Results are as follows:    Attention  Raw score: 199  Severity rating: Within normal limits    Memory  Raw score: 175  Severity rating: Within normal limits    Executive Functions  Raw score: 39  Severity rating: Within normal limits    Language  Raw score: 32  Severity rating: Within  normal limits    Visuospatial Skills  Raw score: 98  Severity rating: Within normal limits    Clock Drawing  Raw score: 13  Severity rating: Within normal limits       ASSESSMENT    Diagnosis: No Significant Deficits Noted in evaluation  Comment on Diagnosis: Pt is able to functionally communicate with others in his living environment, presents with functional memory skills and strong cognitive skills. Speech Therapy is not warranted at this time, as pt does not present with  any functional deficits. Please re-consult if there is a change in overall status or cognitive skills. Pt and his wife were agreeable to this plan of care and had no further questions or concerns.     The patient/family have given verbal consent for evaluation and treatment.         Total Time with pt: 60 minutes.  The risks/benefits of therapy have been discussed with the patient/caregiver and he/she is in agreement with the established plan of care.     Elyn Peers, M.S., [place] 07/06/2013, 17:26  Speech-Language Pathologist       Thank you for this referral.  Physician Signature & date: ________________________________________________

## 2013-07-06 NOTE — PT Evaluation (Signed)
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 Koren Bound  Next Physician Follow-up Visit: 07/19/2013    Past Medical History   Diagnosis Date    Aortic valve replaced     Diverticulitis     Wears glasses     Abnormal EKG     Seizures     Depression     Anxiety     Rectal bleeding     Kidney stones     Viral hepatitis C     Bladder stone      Past Surgical History   Procedure Laterality Date    Hx knee replacment Left     Hx gall bladder surgery/chole      Hx aortic valve replacement      Hx colectomy  2010     at [facility] for diverticulitis.    Hx colonoscopy           Subjective/Objective:   [name] is a 63 y.o., male, who presents to initial evaluation s/p R temporal lobe resection as well as removal of hippocampus on 06/01/2013 due to uncontrolled epilepsy. Patient reports he had his surgery at [facility]. His wife (who present for evaluation), reports that patient was in the hospital for a 3 week extended stay. Wife reports that patient had a seizure while coming out of anesthesia, but has not had a seizure since that episode s/p surgery. Patient and patient's wife unsure if patient needs therapy, however, patient reports that he feels some weakness in his legs secondary to s/p surgery. Patient reports intermittent pain in B hips. Patient states that he has no exercise equipment at home and has not performed any exercises at home. Patient states that he is an active person and plans to exercise outside  once weather becomes warm.     Significant History: Patient recently (1 week ago) hospitalized due to insomnia and anxiety. Patient reports that his wife was unable to assist in his care due to the insomnia and anxiety, so patient was admitted to [facility]. Patient states that he is now on medication (see intake form), at which patient reports he is now sleeping and anxiety has improved.      HR at rest: 56 bpm   SpO2%: 99    Pain: 0/10     Neuroscreen:   Myotomes: L3-S2 grossly 4/5 throughout    No reports of N and T     ROM:  BLEs WFL  BUEs WFL    Endurance: 3MWT   Patient able to perform 840 ft with an RPE of 10. HR 77 bpm and SpO2 97%.    Coordination:    LE tapping: Able    Objective Questionnaire:  ABC Questionnaire: 78% confident     Gait: No gait deviations                             Assessment:   Patient is a 63 year old male who presents to initial evaluation with findings  that are consistent with generalized muscle weakness secondary to s/p temporal lobe resection and removal of hippocampus on right. Patient presents with minor deconditioning and feel that this is due to non use from being bedridden with surgery. From the above findings, I feel that patient would benefit from 3 weeks of therapy for conditioning and to become independent on a home program. Good prognosis.       Goals:   STG - LTG: In 4 weeks (08/06/2013)  1. Patient (and caregivers) will be Independent with HEP.   2. Patient will be able to perform 1000 ft ambulation in a 3 MWT with an RPE of 12 for improvement in endurance.   3. Patient will present with 4+/5 myotomal strength in BLEs.   4. Patient will fill out ABC questionnaire and report 85% confidence.     Plan:     2x/week for 3 weeks for therapeutic exercise, therapeutic activity, manual therapy, gait training, neuromuscular re-education    The risks/benefits of therapy have been discussed with the patient and he/she is in agreement with the established plan of care.     Therapist :              Ginger Carne, PT 07/06/2013, 09:47    **By signing this, I certify that I have read and agree with the therapists recommendations.  This will serve as a new order for the above patient.  Any changes are noted in writing on this paper.        __________________________________________  [id number] Signature/ Date

## 2013-07-07 NOTE — Discharge Summary (Signed)
[facility]  [place], [place] [address]    DISCHARGE SUMMARY      PATIENT NAME:  Rameses, Ou  MRN:  [id number]  DOB:  [birth year]    ADMISSION DATE:  07/01/2013  DISCHARGE DATE:  07/04/2013    ATTENDING PHYSICIAN: Earlean Polka MD  PRIMARY CARE PHYSICIAN: Norberta Keens, MD     ADMISSION DIAGNOSIS: <principal problem not specified>  No chief complaint on file.      DISCHARGE DIAGNOSIS: Mental status changes   There are no hospital problems to display for this patient.    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    Kidney stone 08/04/2011      DISCHARGE MEDICATIONS:  Discharge Medication List as of 07/04/2013  2:40 PM      START taking these medications    Details   traZODone (DESYREL) 50 mg Oral Tablet Take 1 Tab (50 mg total) by mouth Every night as needed for Insomnia for up to 14 days, Disp-14 Tab, R-0, Print         CONTINUE these medications which have NOT CHANGED    Details   escitalopram oxalate (LEXAPRO) 20 mg Oral Tablet Take 1 Tab (20 mg total) by mouth Once a day, Disp-90 Tab, R-2, E-Rx      levETIRAcetam (KEPPRA) 500 mg Oral Tablet Take 4 Tabs (2,000 mg total) by mouth Twice daily, Disp-60 Tab, R-5, E-Rx      OXcarbazepine (TRILEPTAL) 600 mg Oral Tablet Take 1 Tab (600 mg total) by mouth Twice daily, Disp-60 Tab, R-5, E-Rx      oxyCODONE (ROXICODONE) 5 mg Oral Tablet Take 5 mg by mouth Every 4 hours as needed for Pain (every 4-8 hours), Historical Med      tamsulosin (FLOMAX) 0.4 mg Oral Capsule, Sust. Release 24 hr Take 1 Cap (0.4 mg total) by mouth Every evening after dinner, Disp-30 Cap, R-5, E-Rx      !! warfarin (COUMADIN) 10 mg Oral Tablet Take 10 mg by mouth Every Sunday, Tuesday and Thursday At [facility],  Historical Med      !! warfarin (COUMADIN) 7.5 mg Oral Tablet Take 7.5 mg by mouth Every Monday, Wednesday, Friday and Saturday At HS, Historical Med       !! - Potential duplicate medications found. Please discuss with provider.        DISCHARGE INSTRUCTIONS:   No discharge procedures on file.  Follow-up Information    Follow up with [facility] Neurosurgery. (Follow up per Dr. Thornton Papas request)         [place]:  This is a 63 y.o., male admitted via ED after presenting w/family c/o confusion and unable to care @ home.  Pt is 4 weeks S/P lobectomy for epilepsy and was discharged to home after surgery.  He states that he has not necessarily been decompensating @ home, but that he has not felt as well as he did 2-3 days prior to discharge from his neurosurgical hospitalization.  He feels he should have been discharged to a rehabilitation facility and not to home, because he is not well yet from his procedure and c/o memory problems, difficulty with functioning, and insomnia.  It is this last sx in particular that makes it difficult for family to care for pt @ home, because his wife is not well either, and if he gets up and wanders the house @ night (which he does if he has insomnia), then she cannot sleep and as a result has a hard time functioning herself the next day.  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    SIGNIFICANT PHYSICAL FINDINGS: None.  SIGNIFICANT LAB: None.  SIGNIFICANT RADIOLOGY: None.  CONSULTATIONS: Neurosurgery consultation was performed while pt was on unit and neurosurgeon, Dr. Thornton Papas, stated that pt's cognitive deficits were to  be expected as sequelae from major brain surgery, but did not offer any input other than to recommend that he follow up with neurosurgery as outpt after discharge.  PROCEDURES PERFORMED: None.    COURSE IN HOSPITAL: Pt has appointment as outpt @ [facility] where he had the surgery on 3/23 and in order to get to that appointment on time, which is in a.m., we had to discharge him on 3/22.  However, since throughout the hospitalization his major deficits were d/t surgical sequelae, and this was what was holding him up from being able to be cared for @ home, it was seen to be in his best interest to attempt to assist him to be able to get to this appointment.    CONDITION ON DISCHARGE: Alert    DISCHARGE DISPOSITION:  Home discharge     Copies sent to Care Team      Relationship Specialty Notifications Start End    Norberta Keens, MD PCP - General [facility]  09/29/12     Phone: [phone] Fax: [phone]         [place] [address]    Oran Rein PCP - Cardiologist EXTERNAL  09/05/12     Phone: [phone] Fax: [phone]         [place] [address]

## 2013-07-08 ENCOUNTER — Encounter (INDEPENDENT_AMBULATORY_CARE_PROVIDER_SITE_OTHER): Payer: Self-pay | Admitting: Physician Assistant

## 2013-07-08 DIAGNOSIS — M6289 Other specified disorders of muscle: Secondary | ICD-10-CM

## 2013-07-08 DIAGNOSIS — R2681 Unsteadiness on feet: Secondary | ICD-10-CM

## 2013-07-09 ENCOUNTER — Other Ambulatory Visit: Payer: Self-pay | Admitting: Physician Assistant

## 2013-07-09 DIAGNOSIS — M629 Disorder of muscle, unspecified: Principal | ICD-10-CM

## 2013-07-09 DIAGNOSIS — M2420 Disorder of ligament, unspecified site: Secondary | ICD-10-CM

## 2013-07-09 DIAGNOSIS — R269 Unspecified abnormalities of gait and mobility: Secondary | ICD-10-CM

## 2013-07-09 NOTE — Progress Notes (Signed)
Patient's daughter requests PT referral for muscular degeneration and unsteady gait due to being in ICU x 3 weeks and being immobile secondary to s/p lobectomy.   Referral placed.   Currently at [facility] for psychiatric care

## 2013-07-16 ENCOUNTER — Encounter (INDEPENDENT_AMBULATORY_CARE_PROVIDER_SITE_OTHER): Payer: Self-pay

## 2013-07-16 ENCOUNTER — Other Ambulatory Visit (INDEPENDENT_AMBULATORY_CARE_PROVIDER_SITE_OTHER): Payer: Self-pay

## 2013-07-16 DIAGNOSIS — G4700 Insomnia, unspecified: Secondary | ICD-10-CM

## 2013-07-16 MED ORDER — TRAZODONE 50 MG TABLET
50.00 mg | ORAL_TABLET | Freq: Every evening | ORAL | Status: DC | PRN
Start: 2013-07-16 — End: 2013-08-25

## 2013-07-19 ENCOUNTER — Encounter (INDEPENDENT_AMBULATORY_CARE_PROVIDER_SITE_OTHER): Payer: Self-pay

## 2013-07-19 NOTE — Progress Notes (Signed)
Spoke with patients daughter today about patients progress since discharge from [facility]. Per daughter she is doing better since starting the new medications. Pt is following up with physical therapy and occupational therapy and his neurosurgeon. Pt does have an appointment with Dr. Darliss Cheney on 07/23/13 for a follow up. Patient or daughter will call back with any questions or concerns.   Antoney Biven L. Shameek Nyquist, LPN  11/17/6312, 97:02

## 2013-07-20 ENCOUNTER — Ambulatory Visit: Payer: MEDICAID

## 2013-07-22 ENCOUNTER — Ambulatory Visit: Payer: MEDICAID

## 2013-07-23 ENCOUNTER — Ambulatory Visit (INDEPENDENT_AMBULATORY_CARE_PROVIDER_SITE_OTHER): Payer: MEDICAID

## 2013-07-23 ENCOUNTER — Encounter (INDEPENDENT_AMBULATORY_CARE_PROVIDER_SITE_OTHER): Payer: Self-pay

## 2013-07-23 VITALS — BP 132/74 | HR 86 | Temp 97.8°F | Resp 20 | Ht 72.0 in | Wt 209.0 lb

## 2013-07-23 DIAGNOSIS — Z9889 Other specified postprocedural states: Secondary | ICD-10-CM

## 2013-07-23 DIAGNOSIS — G40909 Epilepsy, unspecified, not intractable, without status epilepticus: Secondary | ICD-10-CM

## 2013-07-23 DIAGNOSIS — R5381 Other malaise: Secondary | ICD-10-CM

## 2013-07-23 DIAGNOSIS — R519 Headache, unspecified: Secondary | ICD-10-CM

## 2013-07-23 DIAGNOSIS — R51 Headache: Secondary | ICD-10-CM

## 2013-07-23 DIAGNOSIS — Z9089 Acquired absence of other organs: Secondary | ICD-10-CM

## 2013-07-23 DIAGNOSIS — Q231 Congenital insufficiency of aortic valve: Secondary | ICD-10-CM

## 2013-07-23 DIAGNOSIS — Z5181 Encounter for therapeutic drug level monitoring: Secondary | ICD-10-CM

## 2013-07-23 DIAGNOSIS — Z7901 Long term (current) use of anticoagulants: Secondary | ICD-10-CM

## 2013-07-23 LAB — POCT EAST PROTHROMBIN TIME (AMB)
INR: 5.7
PROTHROMBIN TIME: 57.1

## 2013-07-23 MED ORDER — HYDROCODONE 7.5 MG-ACETAMINOPHEN 325 MG TABLET
1.00 | ORAL_TABLET | Freq: Three times a day (TID) | ORAL | Status: DC | PRN
Start: 2013-07-23 — End: 2013-08-02

## 2013-07-23 NOTE — Progress Notes (Signed)
BP 132/74   Pulse 86   Temp(Src) 36.6 C (97.8 F) (Oral)   Resp 20   Ht 1.829 m (6')   Wt 94.802 kg (209 lb)   BMI 28.34 kg/m2  [name], [place]  07/23/2013, 09:24

## 2013-07-23 NOTE — Progress Notes (Signed)
07/23/13 1000   [facility] PT/INR   Date INR drawn 07/23/13   PT 57.1   INR 5.7   Initials JLS

## 2013-07-23 NOTE — Patient Instructions (Addendum)
COUMADIN - Skip dose of coumadin tonight.  Take 5 mg tablet tomorrow.  Then resume 7.5mg  daily.  INR recheck in 7-10 days - very IMPORTANT.    Referrals to neurology (Dr. Jolly Mango) and Physical Therapy placed today.  Please follow up with [facility] in 6 weeks and should be able to get in with Dr. Jolly Mango after that.  Please ask whether flexeril (a muscle relaxer) would be ok to start taking.    HEADACHES - Start heat to temple area both sides.  Then take 1000mg  tylenol.  If still have headache in 1 hour after tylenol, take Vicodin (hydrocodone) 1 tablet.  Use Vicodin sparingly.

## 2013-07-23 NOTE — Progress Notes (Signed)
[facility] FO[facility]-UP [facility]ISIT    SUBJECTIVE:  [name] is a 63 y.o. male with a chief complaint of No chief complaint on file.    SEIZURES, S/P LOBECTOMY, DEPRESSION  Was admitted to [facility] for severe insomnia, acute psychosis since frontal lobectomy at [facility] for recurrent seizures  Severe depression, paranoia, confusion and hard time managing at home.  Was discharged home on 3/22 PM slightly early in order to make [facility] f/u appointment with neurosurg the following morning  But wife reports they decided to cancel the 3/23 [facility] appointment "because he was sleeping so well on the trazodone after his stay at [facility]"  New medication added only was trazodone 50 mg qhs prn  Wife reports they did attend the 4/6 appointment [place] - Dr. Lilyan Gilford.   Pt requested more medication for his headaches, and was told to take tylenol.  There are some concerns by daughter that she is being lied to about whether [name] is actually attending any of his appointments.  Wife is an alcoholic and has recently been on a bender which is why he has not been able to   Need Rx from [facility] for PT and have not been able to f/u with PT since last week.  This was placed by [place], [place] on 3/27 according to our records but wife states PT was cancelled b/c they did not have the order.  Pt reports currently seizure free since [facility] stay.  Has not made neurosurgery appointment f/u yet - though plan is f/u in 3 months with MRI prior to appointment.  Recommended see Neurologist in 6 weeks at [facility].   Mood is ok when weather is good and he is active outside with gardening.   But when weather is bad, he does worse with sleep. States sleep past few days (bad weather) has been problematic and he has been sleeping throughout the days.   QHS takes a trazodone and lays down for 3-5 hrs and then eventually gets up and goes to get something to eat. Which helps him to go to sleep (having a full stomach).  Has  tried eating a snack qhs which does help.   Biggest concern today is tx headaches.     HEADACHES  Feels HA in band-like distribution around head.   Headaches are worse at the end of the day, improved by the AM.  Tylenol - taking 750mg  prn.  Had been taking roxicodone which was helpful, but ran out after [facility] discharge.    MECHANICAL AORTIC VALVE  Has not had INR checked in a while  10 mg x 4 d per week and 7.5mg  3x per week (but used to take 7.5 daily).   Last INR check 3.16.15 at 2.16  No recent bleeding or bruising    EXAM:  BP 132/74   Pulse 86   Temp(Src) 36.6 C (97.8 F) (Oral)   Resp 20   Ht 1.829 m (6')   Wt 94.802 kg (209 lb)   BMI 28.34 kg/m2  GEN: A&O, in NAD.  Wife here with him for appointment - inappropriately jovial and dismissive of concerns.  PSYCH: Appropriate mood and broad affect. Thoughts logical and goal oriented, well groomed and appropriately dressed. Moderate eye contact. Insight good. A little HOH.  CV: prominent S2, RRR.  LUNGS: CTAB no w/r/r  MSK: TTP b/l temporalis mm which is where his HA are located.       ASSESSMENT and PLAN:  SEIZURE D/O,  H/o FRONTAL LOBECTOMY - Referrals placed today for Neurology - Dr. Jolly Mango - as pt requested f/u in the area.  Rereferral for PT placed today.  Copied PT's hospital note into the order - for general deconditioning.     MECHANICAL AORTIC VALVE - INR 5.7 today as could be expected given has not f/u for INR recheck when on higher dose.  - Skip dose of coumadin tonight.  Take 5 mg tablet tomorrow.  Then resume 7.5mg  daily.  INR recheck in 7-10 days - very IMPORTANT.    HEADACHES - Start heat to temple area both sides.  Then take 1000mg  tylenol.  If still have headache in 1 hour after tylenol, take Vicodin (hydrocodone) 1 tablet.  Use Vicodin sparingly.     [name], DO 07/23/2013, 09:47        Visit Diagnoses and Associated Orders, Previous Medication List, Allergies -- Summary:      No diagnosis found.        Medications:  Current Outpatient  Prescriptions   Medication Sig    escitalopram oxalate (LEXAPRO) 20 mg Oral Tablet Take 1 Tab (20 mg total) by mouth Once a day    levETIRAcetam (KEPPRA) 500 mg Oral Tablet Take 4 Tabs (2,000 mg total) by mouth Twice daily    OXcarbazepine (TRILEPTAL) 600 mg Oral Tablet Take 1 Tab (600 mg total) by mouth Twice daily    oxyCODONE (ROXICODONE) 5 mg Oral Tablet Take 5 mg by mouth Every 4 hours as needed for Pain (every 4-8 hours)    tamsulosin (FLOMAX) 0.4 mg Oral Capsule, Sust. Release 24 hr Take 1 Cap (0.4 mg total) by mouth Every evening after dinner    traZODone (DESYREL) 50 mg Oral Tablet Take 1 Tab (50 mg total) by mouth Every night as needed for Insomnia    warfarin (COUMADIN) 10 mg Oral Tablet Take 10 mg by mouth Every Sunday, Tuesday and Thursday At HS    warfarin (COUMADIN) 7.5 mg Oral Tablet Take 7.5 mg by mouth Every Monday, Wednesday, Friday and Saturday At [facility]        Allergies:  Allergies   Allergen Reactions    Morphine  Other Adverse Reaction (Add comment)     combative

## 2013-08-02 ENCOUNTER — Ambulatory Visit (INDEPENDENT_AMBULATORY_CARE_PROVIDER_SITE_OTHER): Payer: MEDICAID

## 2013-08-02 ENCOUNTER — Encounter (INDEPENDENT_AMBULATORY_CARE_PROVIDER_SITE_OTHER): Payer: Self-pay

## 2013-08-02 VITALS — BP 130/64 | HR 68 | Temp 98.6°F | Resp 16 | Ht 72.0 in | Wt 207.0 lb

## 2013-08-02 DIAGNOSIS — R519 Headache, unspecified: Secondary | ICD-10-CM

## 2013-08-02 DIAGNOSIS — R51 Headache: Secondary | ICD-10-CM

## 2013-08-02 DIAGNOSIS — Z952 Presence of prosthetic heart valve: Secondary | ICD-10-CM

## 2013-08-02 DIAGNOSIS — G44209 Tension-type headache, unspecified, not intractable: Secondary | ICD-10-CM

## 2013-08-02 DIAGNOSIS — Q231 Congenital insufficiency of aortic valve: Secondary | ICD-10-CM

## 2013-08-02 DIAGNOSIS — F419 Anxiety disorder, unspecified: Secondary | ICD-10-CM

## 2013-08-02 DIAGNOSIS — F411 Generalized anxiety disorder: Secondary | ICD-10-CM

## 2013-08-02 LAB — POCT EAST PROTHROMBIN TIME (AMB)
INR: 1.9
INR: 1.9
PROTHROMBIN TIME: 19.9

## 2013-08-02 MED ORDER — CYCLOBENZAPRINE 5 MG TABLET
ORAL_TABLET | ORAL | Status: DC
Start: 2013-08-02 — End: 2014-02-04

## 2013-08-02 MED ORDER — HYDROCODONE 7.5 MG-ACETAMINOPHEN 325 MG TABLET
1.0000 | ORAL_TABLET | Freq: Three times a day (TID) | ORAL | Status: DC | PRN
Start: 2013-08-02 — End: 2013-08-25

## 2013-08-02 NOTE — Progress Notes (Signed)
08/02/13 1300   [facility] PT/INR   Date INR drawn 08/02/13   PT 19.9   INR 1.9   Initials mlb

## 2013-08-02 NOTE — Patient Instructions (Addendum)
COUMADIN - 7.5mg  daily.  Recheck INR in 3 weeks.     TENSION HEADACHE - flexeril 5-10 mg up to 3 x per day as needed for tension headaches.  Heat to the neck as needed.  Get a heating pad today. Vicodin sparingly.  No more than 3,000mg  tylenol/acetaminophen per day.  Stop Aleve if your stomach starts hurting.    Tension Headache    A tension headache is a feeling of pain, pressure, or aching often felt over the front and sides of the head. The pain can be dull or can feel tight (constricting). It is the most common type of headache. Tension headaches are not normally associated with nausea or vomiting and do not get worse with physical activity. Tension headaches can last 30 minutes to several days.   CAUSES   Most often the cause is related to muscle tension and spasm in the neck and base of head.  Usually it is the trapezius (neck and shoulder muscle) or the sternocleidomastoid (neck turning and sidebending - attaches to the base of the ear) muscle that spasms. Tension headaches often begin after stress, anxiety, or depression. Other triggers may include:   Carrying heavy bags on the shoulder.   Holding the phone between your ear and shoulder.   Driving a lot with poor posture   Alcohol.   Caffeine (too much or withdrawal).   Respiratory infections (colds, flu, sinus infections).   Dental problems or teeth clenching.   Fatigue.   Holding your head and neck in one position too long while using a computer.  SYMPTOMS    Pressure around the head.    Dull, aching head pain.    Pain felt over the front and sides of the head.    Tenderness in the muscles of the head, neck, and shoulders.  DIAGNOSIS   A tension headache is often diagnosed based on:    Symptoms.    Physical examination.    A CT scan or MRI of your head. These tests may be ordered if symptoms are severe or unusual.  TREATMENT    Flexeril is a muscle relaxant medication that can help once a headache has started.   Ibuprofen and tylenol  (up to 3,000mg  of each every 24' hours) as needed is good for pain control.   Neck stretches performed for at least 30 seconds on each side in each position twice daily can help prevent these headaches.   Moist heat from a hot shower, or a rice sock (filled with dry rice and heated in the microwave for 60-90 seconds) placed over the neck and shoulders usually helps.   Osteopathic Manipulation - there are doctors here at [facility] who can treat your muscles, bones and joints to try to remove the muscle spasm and prevent the headaches for the long term.  Just ask the receptionist to schedule an appointment.  HOME CARE INSTRUCTIONS    Only take over-the-counter or prescription medicines for pain or discomfort as directed by your caregiver.    Lie down in a dark, quiet room when you have a headache.    Try massage or other relaxation techniques.    Heat applied to the head and neck can be used. Use these 3 to 4 times per day for 15 to 20 minutes each time, or as needed.    Limit stress.    Sit up straight, and do not tense your muscles.    Make sure you computer screen is at eye level  and your keyboard is at the same height as your elbows.   Don't carry heavy bags on one shoulder only (use a backpack).   Quit smoking if you smoke and limit alcohol use.   Decrease the amount of caffeine you drink, or stop drinking caffeine.   Eat and exercise regularly.   Get 7 to 9 hours of sleep, or as recommended by your doctor.   Drink plenty of water - at least 1/2 gallon daily.   Avoid excessive use of pain medicine as recurrent headaches can occur.   SEEK MEDICAL CARE IF:    You have problems with the medicines you were prescribed.   Your medicines do not work.   You have a change from the usual headache.   You have nausea or vomiting.  SEEK IMMEDIATE MEDICAL CARE IF:    Your headache becomes severe.   You have a fever.   You have a stiff neck.   You have loss of vision.   You have muscular  weakness or loss of muscle control.   You lose your balance or have trouble walking.   You feel faint or pass out.   You have severe symptoms that are different from your first symptoms.    SCALENE (NECK) MUSCLE TENSION            The scalene muscles connect from the upper and middle neck bones to the ribs.  When they contract, they move the head from side to side (ear towards shoulder). They also help to stabilize the neck from jarring or whiplash.   The scalenes can easily get strained from chronically holding the shoulders up too high (like often happens with typing on a computer or gripping a steering wheel while driving for long periods of time) or with chronic stress/anxiety and poor posture.    The brachial plexus contains all of the nerves that travel to the arm and hand - it passes just between the scalene muscles.  So spasm of the scalenes can lead to nerve pain in the arm as the nerves get pinched between the muscles.    TREATMENT   Scalene muscle tension is best treated with osteopathic manipulation, heat (such as a rice sock applied for 20 min several times per day) and neck stretches performed at least twice daily.   Also work on improving your posture - sitting up straight and keeping your head balanced on top of your spine. Focus on relaxing your shoulders and neck, even when you are stressed.   Avoid carrying heavy bags only on one shoulder which can stress one side of your neck more than the other.   Sometimes muscle relaxant medications are prescribed, and tylenol or ibuprofen can be helpful for relieving acute pain.  The best and most effective treatment is regular stretching though.           1) Ear towards shoulder  2) Ear towards breast pocket  3) Eyes towards breast pocket    Hold each for 30-60 seconds.  Perform all three on both sides.  Add weight of opposite hand on top of head for additional stretch.

## 2013-08-02 NOTE — Progress Notes (Signed)
[facility] FOLLOW-UP VISIT    SUBJECTIVE:  [name] is a 63 y.o. male with a chief complaint of   Chief Complaint   Patient presents with    Anticoagulation     MECHANICAL AORTIC VALVE  Goal is 2.2-2.5 ideally. (2-3 acceptable)  Had been high at 5.7 10 days ago   Changed from 10 mg 4d per week and 7.5mg  3 x per week to 5 mg daily (rather than the 7.5mg  daily directed). Did skip the first night as directed.  Realized was only taking 5 mg 2 days ago and started the 7.5mg  two days ago.    HEADACHES  Started taking benadryl with naproxen   Thinks tylenol was giving him diarrhea.  Vicodin was not giving him diarrhea.  It was mildly helpful.  Thinks was taking BID-TID vicodin.  Ran out around Wed/Thurs last week. Has really not been taking tylenol the past week.  Has been taking Aleve qhs - started a couple days ago.   Feels like its' because of the pain that he cannot sleep at night.  Has been standing in the hot water in the shower, will get a heating pad today.  Feels most of the pain is on the sides of the neck and shoulders.    SEIZURE D/O and h/o FRONTAL LOBECTOMY  Will see Jolly Mango next month - they think - hasn't actually talked with the office yet  PT scheduled for this Friday.    Hit his head on the microwave door 2-3 days ago.     EXAM:  BP 130/64   Pulse 68   Temp(Src) 37 C (98.6 F) (Oral)   Resp 16   Ht 1.829 m (6')   Wt 93.895 kg (207 lb)   BMI 28.07 kg/m2  GEN: A&O, in NAD  PSYCH: Appropriate mood and broad affect. Thoughts logical and goal oriented, well groomed and appropriately dressed. Moderate eye contact. Insight fair. HOH.  SKIN: 5cm diameter of ecchymosis right temporal region.  MSK: TTP b/l traps. Anterior head carriage. Poor posture.      ASSESSMENT and PLAN:  COUMADIN - restart 7.5mg  daily. Recheck INR in 3 weeks.  TENSION HA - start flexeril 5-10 TID prn. #90.  Vicodin 7.5 #21 Rxed today.   F/U in 3 weeks.    [name], DO 08/02/2013, 13:40        Visit Diagnoses  and Associated Orders, Previous Medication List, Allergies -- Summary:        ICD-9-CM    1. Mechanical heart valve present V43.3 POCT EAST PROTHROMBIN TIME (AMB)           Medications:  Current Outpatient Prescriptions   Medication Sig    escitalopram oxalate (LEXAPRO) 20 mg Oral Tablet Take 1 Tab (20 mg total) by mouth Once a day    HYDROcodone-acetaminophen (NORCO) 7.5-325 mg Oral Tablet Take 1 Tab by mouth Every 8 hours as needed (severe pain) Earliest Fill Date: 07/23/13    levETIRAcetam (KEPPRA) 500 mg Oral Tablet Take 4 Tabs (2,000 mg total) by mouth Twice daily    OXcarbazepine (TRILEPTAL) 600 mg Oral Tablet Take 1 Tab (600 mg total) by mouth Twice daily    tamsulosin (FLOMAX) 0.4 mg Oral Capsule, Sust. Release 24 hr Take 1 Cap (0.4 mg total) by mouth Every evening after dinner    traZODone (DESYREL) 50 mg Oral Tablet Take 1 Tab (50 mg total) by mouth Every night as needed for Insomnia    warfarin (  COUMADIN) 10 mg Oral Tablet Take 10 mg by mouth Every Sunday, Tuesday and Thursday At HS    warfarin (COUMADIN) 7.5 mg Oral Tablet Take 7.5 mg by mouth Once a day At HS        Allergies:  Allergies   Allergen Reactions    Morphine  Other Adverse Reaction (Add comment)     combative

## 2013-08-02 NOTE — Progress Notes (Signed)
BP 130/64   Pulse 68   Temp(Src) 37 C (98.6 F) (Oral)   Resp 16   Ht 1.829 m (6')   Wt 93.895 kg (207 lb)   BMI 28.07 kg/m2  Bing Quarry, [place]  08/02/2013, 13:15

## 2013-08-05 ENCOUNTER — Telehealth (INDEPENDENT_AMBULATORY_CARE_PROVIDER_SITE_OTHER): Payer: Self-pay

## 2013-08-05 NOTE — Telephone Encounter (Signed)
Please review and sign the [employer] form found in your box    [facility] is to fax to Julian  Registration Specialist

## 2013-08-06 ENCOUNTER — Ambulatory Visit: Payer: MEDICAID | Admitting: Rehabilitative and Restorative Service Providers"

## 2013-08-09 NOTE — Telephone Encounter (Signed)
Received scanned and faxed    LeShae Jeans  Registration Specialist

## 2013-08-19 ENCOUNTER — Telehealth: Payer: Self-pay

## 2013-08-19 NOTE — Telephone Encounter (Signed)
Spoke to patient regarding scheduling PT appointments. Patient's wife not home and patient wanted to wait until she was home to schedule appointments secondary to him not driving. Patient provided with number to facility and instructed to call to schedule once wife home.

## 2013-08-25 ENCOUNTER — Ambulatory Visit (INDEPENDENT_AMBULATORY_CARE_PROVIDER_SITE_OTHER): Payer: MEDICAID

## 2013-08-25 ENCOUNTER — Encounter (INDEPENDENT_AMBULATORY_CARE_PROVIDER_SITE_OTHER): Payer: Self-pay

## 2013-08-25 VITALS — BP 130/80 | HR 60 | Temp 97.2°F | Resp 24 | Wt 209.0 lb

## 2013-08-25 DIAGNOSIS — G4700 Insomnia, unspecified: Secondary | ICD-10-CM | POA: Insufficient documentation

## 2013-08-25 DIAGNOSIS — G44229 Chronic tension-type headache, not intractable: Secondary | ICD-10-CM | POA: Insufficient documentation

## 2013-08-25 DIAGNOSIS — Z952 Presence of prosthetic heart valve: Secondary | ICD-10-CM

## 2013-08-25 DIAGNOSIS — Z7901 Long term (current) use of anticoagulants: Secondary | ICD-10-CM

## 2013-08-25 DIAGNOSIS — G40909 Epilepsy, unspecified, not intractable, without status epilepticus: Secondary | ICD-10-CM

## 2013-08-25 DIAGNOSIS — Q231 Congenital insufficiency of aortic valve: Secondary | ICD-10-CM

## 2013-08-25 DIAGNOSIS — Z954 Presence of other heart-valve replacement: Secondary | ICD-10-CM

## 2013-08-25 DIAGNOSIS — Z9089 Acquired absence of other organs: Secondary | ICD-10-CM

## 2013-08-25 DIAGNOSIS — Z9889 Other specified postprocedural states: Secondary | ICD-10-CM

## 2013-08-25 DIAGNOSIS — Z5181 Encounter for therapeutic drug level monitoring: Secondary | ICD-10-CM

## 2013-08-25 LAB — POCT EAST PROTHROMBIN TIME (AMB)
INR: 2.4
PROTHROMBIN TIME: 24

## 2013-08-25 NOTE — Progress Notes (Signed)
BP 130/80    Pulse 60    Temp(Src) 36.2 C (97.2 F) (Oral)    Resp 24    Wt 94.802 kg (209 lb)   Vitals obtained by Verdia Kuba MA Extern from [facility]

## 2013-08-25 NOTE — Progress Notes (Signed)
[facility] FOLLOW-UP VISIT    SUBJECTIVE:  [name] is a 63 y.o. male with a chief complaint of   Chief Complaint   Patient presents with    Anticoagulation     MECHANICAL AORTIC VALVE  Goal is tight control 2.0-2.5  Was 1.9 at last appt  Has been taking 7.5mg  daily  Did not miss any doses  Eating a few more veggies    INR 2.5/ PT 24.0    TENSION HA  No longer any HA.  Flexeril has been helping quite a bit.  Intially was taking hydrocodone with it, but then   Also got a heating pad to sleep with and use hot water in the shower  Out cutting grass     INSOMNIA  Stays up until feels tired around 10 or 11pm  But when goes to bed will lay there for 3 hrs trying to fall asleep  Gets up to get water and goes back to bed  Finally falls asleep and then in bed til 3PM  Sleeping a lot during the day  But has been doing more yard work during the day  Does not take any melatonin   Trazodone -not sure if it was helpful      EXAM:  BP 130/80    Pulse 60    Temp(Src) 36.2 C (97.2 F) (Oral)    Resp 24    Wt 94.802 kg (209 lb)   GEN: A&O, in NAD  PSYCH: Appropriate mood and broad affect. Thoughts logical and goal oriented, well groomed and appropriately dressed. Moderate eye contact. Insight good.  SKIN: no ecchymosis noted.    ASSESSMENT and PLAN:  MECHANICAL AORTIC VALVE - INR good.  Continue current dosing 7.5mg  daily.  INSOMNIA - recommend stop trazodone.  Start melatonin 2.5-6mg  1 hr before bedtime. Reviewed interactions/side effects - should be ok.  If melatonin not helpful enough, then can trial adding in valarian.  Continue work during the day to help with sleep qhs.  Sleep hygeine handout given.  HA - keep up the good work.    [name], DO 08/25/2013, 09:11        Visit Diagnoses and Associated Orders, Previous Medication List, Allergies -- Summary:        ICD-9-CM    1. Anticoagulation goal of INR 2 to 3 V58.83 POCT EAST PROTHROMBIN TIME (AMB)    V58.61            Medications:  Current  Outpatient Prescriptions   Medication Sig    cyclobenzaprine (FLEXERIL) 5 mg Oral Tablet Take 1-2 tablets up to 3x per day as needed for tension headache.    escitalopram oxalate (LEXAPRO) 20 mg Oral Tablet Take 1 Tab (20 mg total) by mouth Once a day    HYDROcodone-acetaminophen (NORCO) 7.5-325 mg Oral Tablet Take 1 Tab by mouth Every 8 hours as needed (severe pain)    levETIRAcetam (KEPPRA) 500 mg Oral Tablet Take 4 Tabs (2,000 mg total) by mouth Twice daily    OXcarbazepine (TRILEPTAL) 600 mg Oral Tablet Take 1 Tab (600 mg total) by mouth Twice daily    tamsulosin (FLOMAX) 0.4 mg Oral Capsule, Sust. Release 24 hr Take 1 Cap (0.4 mg total) by mouth Every evening after dinner    traZODone (DESYREL) 50 mg Oral Tablet Take 1 Tab (50 mg total) by mouth Every night as needed for Insomnia    warfarin (COUMADIN) 10 mg Oral Tablet Take 10 mg by mouth  Every Sunday, Tuesday and Thursday At HS    warfarin (COUMADIN) 7.5 mg Oral Tablet Take 7.5 mg by mouth Once a day At [facility]        Allergies:  Allergies   Allergen Reactions    Morphine  Other Adverse Reaction (Add comment)     combative

## 2013-08-25 NOTE — Progress Notes (Signed)
08/25/13 0900   [facility] PT/INR   Date INR drawn 08/25/13   PT 24.0   INR 2.4   Initials JLS

## 2013-08-25 NOTE — Patient Instructions (Signed)
SLEEP HYGIENE  From [place] [facility] website    Helpful Hints to Help You Sleep              Poor sleep habits (referred to as hygiene) are among the most common problems encountered in our society. We stay up too late and get up too early. We interrupt our sleep with drugs, chemicals and work, and we overstimulate ourselves with late-night activities such as television.               Below are some essentials of good sleep habits. Many of these points will seem like common sense. But it is surprising how many of these important points are ignored by many of us.   Your Personal Habits   Fix a bedtime and an awakening time. Do not be one of those people who allows bedtime and awakening time to drift. The body "gets used" to falling asleep at a certain time, but only if this is relatively fixed. Even if you are retired or not working, this is an essential component of good sleeping habits.  Avoid napping during the day. If you nap throughout the day, it is no wonder that you will not be able to sleep at night. The late afternoon for most people is a "sleepy time." Many people will take a nap at that time. This is generally not a bad thing to do, provided you limit the nap to 30-45 minutes and can sleep well at night.  Avoid alcohol 4-6 hours before bedtime. Many people believe that alcohol helps them sleep. While alcohol has an immediate sleep-inducing effect, a few hours later as the alcohol levels in your blood start to fall, there is a stimulant or wake-up effect.  Avoid caffeine 4-6 hours before bedtime. This includes caffeinated beverages such as coffee, tea and many sodas, as well as chocolate, so be careful.  Avoid heavy, spicy, or sugary foods 4-6 hours before bedtime. These can affect your ability to stay asleep.  Exercise regularly, but not right before bed. Regular exercise, particularly in the afternoon, can help deepen sleep. Strenuous exercise within the 2 hours before  bedtime, however, can decrease your ability to fall asleep.  Your Sleeping Environment  Use comfortable bedding. Uncomfortable bedding can prevent good sleep. Evaluate whether or not this is a source of your problem, and make appropriate changes.  Find a comfortable temperature setting for sleeping and keep the room well ventilated. If your bedroom is too cold or too hot, it can keep you awake. A cool (not cold) bedroom is often the most conducive to sleep.  Block out all distracting noise, and eliminate as much light as possible.  Reserve the bed for sleep and sex. Don't use the bed as an office, workroom or recreation room. Let your body "know" that the bed is associated with sleeping.  Getting Ready For Bed  Try a light snack before bed. Warm milk and foods high in the amino acid tryptophan, such as bananas, may help you to sleep.  Practice relaxation techniques before bed. Relaxation techniques such as yoga, deep breathing and others may help relieve anxiety and reduce muscle tension.   Don't take your worries to bed. Leave your worries about job, school, daily life, etc., behind when you go to bed. Some people find it useful to assign a "worry period" during the evening or late afternoon to deal with these issues.  Establish a pre-sleep ritual. Pre-sleep rituals, such as a warm   bath or a few minutes of reading, can help you sleep.  Get into your favorite sleeping position. If you don't fall asleep within 15-30 minutes, get up, go into another room, and read until sleepy.  Getting Up in the Middle of the Night  Most people wake up one or two times a night for various reasons. If you find that you get up in the middle of night and cannot get back to sleep within 15-20 minutes, then do not remain in the bed "trying hard" to sleep. Get out of bed. Leave the bedroom. Read, have a light snack, do some quiet activity, or take a bath. You will generally find that you can get back to sleep 20 minutes or so later. Do not  perform challenging or engaging activity such as office work, housework, etc. Do not watch television.  A Word About Television  Many people fall asleep with the television on in their room. Watching television before bedtime is often a bad idea. Television is a very engaging medium that tends to keep people up. We generally recommend that the television not be in the bedroom. At the appropriate bedtime, the TV should be turned off and the patient should go to bed. Some people find that the radio helps them go to sleep. Since radio is a less engaging medium than TV, this is probably a good idea.  Other Factors  Several physical factors are known to upset sleep. These include arthritis, acid reflux with heartburn, menstruation, headaches and hot flashes.   Psychological and mental health problems like depression, anxiety and stress are often associated with sleeping difficulty. In many cases, difficulty staying asleep may be the only presenting sign of depression. Talk with your physician about these issues to help determine the problem and the best treatment.  Many medications can cause sleeplessness as a side effect. Ask your doctor or pharmacist if medications you are taking can lead to sleeplessness.  To help overall improvement in sleep patterns, your doctor may prescribe sleep medications for short-term relief of a sleep problem. The decision to take sleeping aids is a medical one to be made in the context of your overall health picture.  Always follow the advice of your physician and other healthcare professionals. The goal is to rediscover how to sleep naturally.    Source: Sleep Hygiene  [place] [facility] http://[employer].edu/programs/sleep/patients/sleep-hygiene#ixzz35YfHSQnF    [facility]

## 2013-08-27 ENCOUNTER — Ambulatory Visit
Admission: RE | Admit: 2013-08-27 | Discharge: 2013-08-27 | Disposition: A | Discharge status: Still In Hospital | Payer: MEDICAID | Source: Ambulatory Visit

## 2013-08-27 DIAGNOSIS — M2420 Disorder of ligament, unspecified site: Secondary | ICD-10-CM | POA: Insufficient documentation

## 2013-08-27 DIAGNOSIS — R5381 Other malaise: Secondary | ICD-10-CM | POA: Insufficient documentation

## 2013-08-27 DIAGNOSIS — IMO0001 Reserved for inherently not codable concepts without codable children: Secondary | ICD-10-CM | POA: Insufficient documentation

## 2013-08-27 DIAGNOSIS — R5383 Other fatigue: Secondary | ICD-10-CM | POA: Insufficient documentation

## 2013-08-27 DIAGNOSIS — M629 Disorder of muscle, unspecified: Secondary | ICD-10-CM | POA: Insufficient documentation

## 2013-08-27 DIAGNOSIS — R269 Unspecified abnormalities of gait and mobility: Secondary | ICD-10-CM | POA: Insufficient documentation

## 2013-08-27 NOTE — PT Treatment (Signed)
[facility]  [place], [place] [address]     Outpatient Rehabilitation Services  Physical Therapy Progress Note    Patient Name: [name]  Date of Birth: 23-Jun-1950  MRN: [id number]  Payor: [place] MEDICAID / Plan: 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 Koren Bound  Next Physician Follow-up Visit: 07/19/2013    08/27/2013 is the patient's 2nd visit      Subjective   Pt reports mowing the lawn took him three times as long.    Objective   Therapeutic Exercise: 26 min  Treadmill 1.8 mph x8 min  Tall bike x8 min  HC Stretch x3  HS Stretch x3     Total Treatment time:  28 minutes                        Assessment:   He seemed to be challenged with today's initial PT session.    Goals:   STG - LTG: In 4 weeks (08/06/2013)  1. Patient (and caregivers) will be Independent with HEP.   2. Patient will be able to perform 1000 ft ambulation in a 3 MWT with an RPE of 12 for improvement in endurance.   3. Patient will present with 4+/5 myotomal strength in BLEs.   4. Patient will fill out ABC questionnaire and report 85% confidence.     Plan:     2x/week for 3 weeks for therapeutic exercise, therapeutic activity, manual therapy, gait training, neuromuscular re-education  The risks/benefits of therapy have been discussed with the patient and he/she is in agreement with the established plan of care.    Therapist :     Johnsie Cancel, PTA 08/27/2013, 13:05

## 2013-09-01 ENCOUNTER — Ambulatory Visit
Admission: RE | Admit: 2013-09-01 | Discharge: 2013-09-01 | Disposition: A | Discharge status: Still In Hospital | Payer: MEDICAID | Source: Ambulatory Visit

## 2013-09-01 NOTE — PT Treatment (Signed)
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 Koren Bound  Next Physician Follow-up Visit: 07/19/2013    09/01/2013 is the patient's 3rd visit      Subjective   No new reports.    Objective   Therapeutic Exercise: 20 min Group & 10 min 1:1  Treadmill 1.8 mph x8 min  Tall bike x8 min  HC Stretch x3  HS Stretch x3  Added: 1 to 1  Alt hip march x20 B  Standing hip ext x20 B     Total Treatment time:  32 minutes                        Assessment:   Good effort.    Goals:   STG - LTG: In 4 weeks (08/06/2013)  1. Patient (and caregivers) will be Independent with HEP.   2. Patient will be able to perform 1000 ft ambulation in a 3 MWT with an RPE of 12 for improvement in endurance.   3. Patient will present with 4+/5 myotomal strength in BLEs.   4. Patient will fill out ABC questionnaire and report 85% confidence.     Plan:     2x/week for 3 weeks for therapeutic exercise, therapeutic activity, manual therapy, gait training, neuromuscular re-education  The risks/benefits of therapy have been discussed with the patient and he/she is in agreement with the established plan of care.    Therapist :     Johnsie Cancel, PTA 09/01/2013, 13:59

## 2013-09-03 ENCOUNTER — Ambulatory Visit
Admission: RE | Admit: 2013-09-03 | Discharge: 2013-09-03 | Disposition: A | Discharge status: Still In Hospital | Payer: MEDICAID | Source: Ambulatory Visit

## 2013-09-03 NOTE — PT Treatment (Signed)
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 Koren Bound  Next Physician Follow-up Visit: 07/19/2013    09/03/2013 is the patient's 4th visit      Subjective   He reports getting more yard work done.    Objective   Therapeutic Exercise: 30 min.  Treadmill 1.8 mph x8 min  Tall bike x8 min  HC Stretch x3  HS Stretch x3  Standing:  Alt hip march x20 B  Standing hip ext x20 B     Total Treatment time:  32 minutes                        Assessment:   Less fatigue with PT program.      Goals:   STG - LTG: In 4 weeks (08/06/2013)  1. Patient (and caregivers) will be Independent with HEP.   2. Patient will be able to perform 1000 ft ambulation in a 3 MWT with an RPE of 12 for improvement in endurance.   3. Patient will present with 4+/5 myotomal strength in BLEs.   4. Patient will fill out ABC questionnaire and report 85% confidence.     Plan:     2x/week for 3 weeks for therapeutic exercise, therapeutic activity, manual therapy, gait training, neuromuscular re-education  The risks/benefits of therapy have been discussed with the patient and he/she is in agreement with the established plan of care.    Therapist :     Johnsie Cancel, PTA 09/03/2013, 14:08

## 2013-09-08 ENCOUNTER — Ambulatory Visit: Payer: Self-pay | Admitting: Rehabilitative and Restorative Service Providers"

## 2013-09-10 ENCOUNTER — Ambulatory Visit
Admission: RE | Admit: 2013-09-10 | Discharge: 2013-09-10 | Disposition: A | Discharge status: Still In Hospital | Payer: MEDICAID | Source: Ambulatory Visit

## 2013-09-10 DIAGNOSIS — R5381 Other malaise: Secondary | ICD-10-CM | POA: Insufficient documentation

## 2013-09-10 DIAGNOSIS — M2420 Disorder of ligament, unspecified site: Secondary | ICD-10-CM | POA: Insufficient documentation

## 2013-09-10 DIAGNOSIS — M629 Disorder of muscle, unspecified: Secondary | ICD-10-CM | POA: Insufficient documentation

## 2013-09-10 DIAGNOSIS — R269 Unspecified abnormalities of gait and mobility: Secondary | ICD-10-CM | POA: Insufficient documentation

## 2013-09-10 DIAGNOSIS — IMO0001 Reserved for inherently not codable concepts without codable children: Secondary | ICD-10-CM | POA: Insufficient documentation

## 2013-09-10 DIAGNOSIS — R5383 Other fatigue: Secondary | ICD-10-CM | POA: Insufficient documentation

## 2013-09-10 NOTE — PT Treatment (Signed)
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 Koren Bound  Next Physician Follow-up Visit: 07/19/2013    09/10/2013 is the patient's 5th visit      Subjective   His wife explains getting him motivated to come in.  Pt inquires about PT duration.    Objective   Therapeutic Exercise: 30 min.  Treadmill 1.8 mph x8 min  Tall bike x8 min  HC Stretch x3  HS Stretch x3  Standing:  Alt hip march x20 B  Standing hip ext x20 B     Total Treatment time:  32 minutes                        Assessment:   He needs verbal cues for correct body position while completing the stretches.  I feel the patient is ready for discharge, however needs to become more well versed with HEP.  Pt lacks determination and feel his endurance could still improve.       Goals:   STG - LTG: In 4 weeks (08/06/2013)  1. Patient (and caregivers) will be Independent with HEP.   2. Patient will be able to perform 1000 ft ambulation in a 3 MWT with an RPE of 12 for improvement in endurance.   3. Patient will present with 4+/5 myotomal strength in BLEs.   4. Patient will fill out ABC questionnaire and report 85% confidence.     Plan:     2x/week for 3 weeks for therapeutic exercise, therapeutic activity, manual therapy, gait training, neuromuscular re-education  The risks/benefits of therapy have been discussed with the patient and he/she is in agreement with the established plan of care.    Therapist :     Johnsie Cancel, PTA 09/10/2013, 12:50

## 2013-09-17 ENCOUNTER — Ambulatory Visit
Admission: RE | Admit: 2013-09-17 | Discharge: 2013-09-17 | Disposition: A | Discharge status: Still In Hospital | Payer: MEDICAID | Source: Ambulatory Visit

## 2013-09-17 NOTE — PT Treatment (Signed)
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 Koren Bound  Next Physician Follow-up Visit: 07/19/2013    09/17/2013 is the patient's 6th visit      Subjective   Patient states that he is "feeling good" today.    Objective   Therapeutic Exercise: 30 min.  Treadmill 2.0 mph x10 min  Held - Tall bike x8 min  HC Stretch x3  HS Stretch x3  Squats x 20  Standing:  [place] fw/sw/bw x15  Held-standing hip march x20 B  Held - Standing hip ext x20 B     Total Treatment time:  32 minutes                        Assessment:   Patient uses a treadmill at home for HEP and seems confident with ambulation/balance.  Good tolerance with today's program.      Goals:   STG - LTG: In 4 weeks (08/06/2013)  1. Patient (and caregivers) will be Independent with HEP.  MET, TREADMILL IN HOME  2. Patient will be able to perform 1000 ft ambulation in a 3 MWT with an RPE of 12 for improvement in endurance.   3. Patient will present with 4+/5 myotomal strength in BLEs.   4. Patient will fill out ABC questionnaire and report 85% confidence.  MET    Plan:     Pt is discharged from skilled physical therapy services.  The risks/benefits of therapy have been discussed with the patient and he/she is in agreement with the established plan of care.    Therapist :     Johnsie Cancel, PTA 09/17/2013, 11:55

## 2013-09-22 ENCOUNTER — Ambulatory Visit: Payer: Self-pay | Admitting: Rehabilitative and Restorative Service Providers"

## 2013-09-26 ENCOUNTER — Other Ambulatory Visit (INDEPENDENT_AMBULATORY_CARE_PROVIDER_SITE_OTHER): Payer: Self-pay

## 2013-09-27 NOTE — Telephone Encounter (Signed)
Will send in refill, but patient will need to continue to get close followup for his seizures.

## 2013-09-28 ENCOUNTER — Encounter (INDEPENDENT_AMBULATORY_CARE_PROVIDER_SITE_OTHER): Payer: Self-pay

## 2013-09-28 ENCOUNTER — Ambulatory Visit (INDEPENDENT_AMBULATORY_CARE_PROVIDER_SITE_OTHER): Payer: MEDICAID

## 2013-09-28 VITALS — BP 128/70 | HR 68 | Temp 97.8°F | Resp 16 | Ht 72.0 in | Wt 214.0 lb

## 2013-09-28 DIAGNOSIS — Z952 Presence of prosthetic heart valve: Secondary | ICD-10-CM

## 2013-09-28 DIAGNOSIS — G4700 Insomnia, unspecified: Secondary | ICD-10-CM

## 2013-09-28 DIAGNOSIS — Z954 Presence of other heart-valve replacement: Secondary | ICD-10-CM

## 2013-09-28 DIAGNOSIS — G40909 Epilepsy, unspecified, not intractable, without status epilepticus: Secondary | ICD-10-CM

## 2013-09-28 DIAGNOSIS — Z9089 Acquired absence of other organs: Secondary | ICD-10-CM

## 2013-09-28 DIAGNOSIS — Z9889 Other specified postprocedural states: Secondary | ICD-10-CM

## 2013-09-28 LAB — POCT EAST PROTHROMBIN TIME (AMB)
INR: 2.1
PROTHROMBIN TIME: 21.8

## 2013-09-28 NOTE — Progress Notes (Signed)
BP 128/70    Pulse 68    Temp(Src) 36.6 C (97.8 F) (Oral)    Resp 16    Ht 1.829 m (6')    Wt 97.07 kg (214 lb)    BMI 29.02 kg/m2     Bing Quarry, [place]  09/28/2013, 09:21

## 2013-09-28 NOTE — Progress Notes (Signed)
09/28/13 0900   [facility] PT/INR   PT 21.8   INR 2.1   Initials mlb

## 2013-09-28 NOTE — Progress Notes (Signed)
[facility] FOLLOW-UP VISIT    SUBJECTIVE:  [name] is a 63 y.o. male with a chief complaint of   Chief Complaint   Patient presents with    Heart Valve Check     MECHANICAL AORTIC VALVE  Goal is tight control 2.0 - 2.5  Taking coumadin 7.5mg  nightly  No missed doses  No issues with easy bruising/bleeding  INR today 21.8/2.1    SEIZURE d/o  No seizures at all since the surgery  Finished up PT and discharged him  Has a nordic track at home - has been on that once    INSOMNIA  Doing quite poorly  Lays down for 1.5 hrs and finally gets up  Less sleeping during the day.   Often sleeps from 5am til 2pm (or 3am til 10am)  Feels overall is getting enough sleep but just not sleeping when he wants to.   Did start melatonin 5mg  and valarian (puritans pride 1,000mg ).   Trazodone not helpful at all.      EXAM:  BP 128/70    Pulse 68    Temp(Src) 36.6 C (97.8 F) (Oral)    Resp 16    Ht 1.829 m (6')    Wt 97.07 kg (214 lb)    BMI 29.02 kg/m2     GEN: A&O, in NAD  PSYCH: Appropriate mood and broad affect. Thoughts logical and goal oriented, well groomed and appropriately dressed. Moderate eye contact. Insight good.   CV: RRR, s1 and s2, with s2 click  LUNGS: CTAB no w/r/r    ASSESSMENT and PLAN:  INSOMNIA - continue with melatonin 5 mg qhs and valarian  Encouraged to get up a little earlier and do some physical exercise daily.   MECHANICAL AORTIC VALVE - well controlled. Continue coumadin at 7.5 daily.    [name], DO 09/28/2013, 09:38        Visit Diagnoses and Associated Orders, Previous Medication List, Allergies -- Summary:        ICD-9-CM    1. Mechanical heart valve present V43.3 POCT EAST PROTHROMBIN TIME (AMB)           Medications:  Current Outpatient Prescriptions   Medication Sig    cyclobenzaprine (FLEXERIL) 5 mg Oral Tablet Take 1-2 tablets up to 3x per day as needed for tension headache.    escitalopram oxalate (LEXAPRO) 20 mg Oral Tablet Take 1 Tab (20 mg total) by mouth Once a  day    levETIRAcetam (KEPPRA) 500 mg Oral Tablet Take 2 Tabs (1,000 mg total) by mouth Twice daily    OXcarbazepine (TRILEPTAL) 600 mg Oral Tablet Take 1 Tab (600 mg total) by mouth Twice daily    tamsulosin (FLOMAX) 0.4 mg Oral Capsule, Sust. Release 24 hr Take 1 Cap (0.4 mg total) by mouth Every evening after dinner    warfarin (COUMADIN) 10 mg Oral Tablet Take 10 mg by mouth Every Sunday, Tuesday and Thursday At HS    warfarin (COUMADIN) 7.5 mg Oral Tablet Take 7.5 mg by mouth Once a day At HS        Allergies:  Allergies   Allergen Reactions    Morphine  Other Adverse Reaction (Add comment)     combative

## 2013-09-28 NOTE — Patient Instructions (Signed)
VALERIAN ROOT: Dosing - Adult  For insomnia, valerian may be taken 1 - 2 hours before bedtime, or up to 3 times in the course of the day, with the last dose near bedtime. It may take a few weeks before the effects are felt.   Tea: Pour 1 cup boiling water over 1 teaspoonful (2 - 3 g) of dried root, steep 5 - 10 minutes.   Tincture (1:5): 1 - 1 1/2 tsp (4 - 6 mL)   Fluid extract (1:1): 1/2 - 1 tsp (1 - 2 mL)   Dry powdered extract (4:1): 250 - 600 mg   For anxiety, 200 mg 3 - 4 times per day  Once sleep improves, keep taking valerian for 2 - 6 weeks.      Source: Maud Deed of [facility] Spool[employer].com.pt   [facility]   Follow [place]: @[facility]  on [employer] on [place] [employer] [place] website    Helpful Hints to Help You Sleep   Poor sleep habits (referred to as hygiene) are among the most common problems encountered in our society. We stay up too late and get up too early. We interrupt our sleep with drugs, chemicals and work, and we overstimulate ourselves with late-night activities such as television.    Below are some essentials of good sleep habits. Many of these points will seem like common sense. But it is surprising how many of these important points are ignored by many of [place].   Your Personal Habits   Fix a bedtime and an awakening time. Do not be one of those people who allows bedtime and awakening time to drift. The body "gets used" to falling asleep at a certain time, but only if this is relatively fixed. Even if you are retired or not working, this is an essential component of good sleeping habits.    Avoid napping during the day. If you nap throughout the day, it is no wonder that you will not be able to sleep at night. The late afternoon for most people is a "sleepy time." Many people will take a nap at that time. This is generally not a bad  thing to do, provided you limit the nap to 30-45 minutes and can sleep well at night.    Avoid alcohol 4-6 hours before bedtime. Many people believe that alcohol helps them sleep. While alcohol has an immediate sleep-inducing effect, a few hours later as the alcohol levels in your blood start to fall, there is a stimulant or wake-up effect.    Avoid caffeine 4-6 hours before bedtime. This includes caffeinated beverages such as coffee, tea and many sodas, as well as chocolate, so be careful.    Avoid heavy, spicy, or sugary foods 4-6 hours before bedtime. These can affect your ability to stay asleep.    Exercise regularly, but not right before bed. Regular exercise, particularly in the afternoon, can help deepen sleep. Strenuous exercise within the 2 hours before bedtime, however, can decrease your ability to fall asleep.  Your Sleeping Environment  Use comfortable bedding. Uncomfortable bedding can prevent good sleep. Evaluate whether or not this is a source of your problem, and make appropriate changes.    Find a comfortable temperature setting for sleeping and keep the room well ventilated. If your bedroom is too cold or too hot, it can keep you awake. A cool (not  cold) bedroom is often the most conducive to sleep.    Block out all distracting noise, and eliminate as much light as possible.    Reserve the bed for sleep and sex. Don't use the bed as an office, workroom or recreation room. Let your body "know" that the bed is associated with sleeping.  Getting Ready For Bed  Try a light snack before bed. Warm milk and foods high in the amino acid tryptophan, such as bananas, may help you to sleep.    Practice relaxation techniques before bed. Relaxation techniques such as yoga, deep breathing and others may help relieve anxiety and reduce muscle tension.     Don't take your worries to bed. Leave your worries about job, school, daily life, etc., behind when you go to bed. Some people find it useful to assign a "worry  period" during the evening or late afternoon to deal with these issues.    Establish a pre-sleep ritual. Pre-sleep rituals, such as a warm bath or a few minutes of reading, can help you sleep.    Get into your favorite sleeping position. If you don't fall asleep within 15-30 minutes, get up, go into another room, and read until sleepy.  Getting Up in the Middle of the Night  Most people wake up one or two times a night for various reasons. If you find that you get up in the middle of night and cannot get back to sleep within 15-20 minutes, then do not remain in the bed "trying hard" to sleep. Get out of bed. Leave the bedroom. Read, have a light snack, do some quiet activity, or take a bath. You will generally find that you can get back to sleep 20 minutes or so later. Do not perform challenging or engaging activity such as office work, housework, Social research officer, government. Do not watch television.  A Word About Television  Many people fall asleep with the television on in their room. Watching television before bedtime is often a bad idea. Television is a very engaging medium that tends to keep people up. We generally recommend that the television not be in the bedroom. At the appropriate bedtime, the TV should be turned off and the patient should go to bed. Some people find that the radio helps them go to sleep. Since radio is a less engaging medium than TV, this is probably a good idea.  Other Factors  Several physical factors are known to upset sleep. These include arthritis, acid reflux with heartburn, menstruation, headaches and hot flashes.     Psychological and mental health problems like depression, anxiety and stress are often associated with sleeping difficulty. In many cases, difficulty staying asleep may be the only presenting sign of depression. Talk with your physician about these issues to help determine the problem and the best treatment.    Many medications can cause sleeplessness as a side effect. Ask your doctor or  pharmacist if medications you are taking can lead to sleeplessness.    To help overall improvement in sleep patterns, your doctor may prescribe sleep medications for short-term relief of a sleep problem. The decision to take sleeping aids is a medical one to be made in the context of your overall health picture.    Always follow the advice of your physician and other healthcare professionals. The goal is to rediscover how to sleep naturally.      Source: [employer] of [facility] http://www.sweeney.biz/   [facility] of [facility]

## 2013-10-28 ENCOUNTER — Encounter (INDEPENDENT_AMBULATORY_CARE_PROVIDER_SITE_OTHER): Payer: MEDICAID

## 2013-11-12 ENCOUNTER — Encounter (INDEPENDENT_AMBULATORY_CARE_PROVIDER_SITE_OTHER): Payer: Self-pay

## 2013-11-12 ENCOUNTER — Ambulatory Visit (INDEPENDENT_AMBULATORY_CARE_PROVIDER_SITE_OTHER): Payer: MEDICAID

## 2013-11-12 VITALS — BP 134/80 | HR 76 | Temp 99.0°F | Resp 16 | Ht 71.0 in | Wt 225.0 lb

## 2013-11-12 DIAGNOSIS — M2550 Pain in unspecified joint: Secondary | ICD-10-CM | POA: Insufficient documentation

## 2013-11-12 DIAGNOSIS — Z954 Presence of other heart-valve replacement: Secondary | ICD-10-CM

## 2013-11-12 DIAGNOSIS — Z952 Presence of prosthetic heart valve: Secondary | ICD-10-CM

## 2013-11-12 DIAGNOSIS — G4700 Insomnia, unspecified: Secondary | ICD-10-CM

## 2013-11-12 LAB — POCT EAST PROTHROMBIN TIME (AMB)
INR: 2.7
PROTHROMBIN TIME: 26.8

## 2013-11-12 MED ORDER — TRAZODONE 50 MG TABLET
100.0000 mg | ORAL_TABLET | Freq: Every evening | ORAL | Status: DC
Start: 2013-11-12 — End: 2014-02-04

## 2013-11-12 NOTE — Progress Notes (Signed)
11/12/13 1000   [facility] PT/INR   Date INR drawn 11/12/13   PT 26.8   INR 2.7   Initials yc   Allie Dimmer, [place]  11/12/2013, 10:28

## 2013-11-12 NOTE — Progress Notes (Signed)
[facility]    SUBJECTIVE:  [name] is a 63 y.o. male with a chief complaint of   Chief Complaint   Patient presents with    Anticoagulation    Insomnia     MECHANICAL AORTIC VALVE  Goal was tight control 2.0-2.5  No missed coumadin doses  7.5mg  nightly  Did take 2 trazodone two nights in the past week (this has an antiplatelet therapy).  No issues with bruising or bleeding  PT 26.8  INR 2.7    INSOMNIA  Intermittent use of trazodone  Down for 2 hrs, then gets back up for 6  No real consistent bedtime  Looking at his smart phone while laying down.  No frosted flakes or raisen bran with extra sugar before bedtime.     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 Thoughts logical and goal oriented, well groomed and appropriately dressed. Moderate eye contact. Insight good.  HOH.  MSK: ROM intact right knee and shoulder.  TTP left lower lumbar paraspinals    ASSESSMENT and PLAN:  MECHANICAL AORTIC VALVE - Continue current dosing but check next INR in 2 weeks. May need to come down in dosing - encouraged to use Trazodone regularly qhs so INR will be predictable.   INSOMNIA - review sleep hygeine. Get back on a schedule.  Take 100 mg Trazodone qhs every night at 10pm.  Plans to go to bed at 10:30.   JOINT PAIN- probably OA. Tylenol or ibuprofen as needed.  Plan for right shoulder steroid injection at f/u visit in 2 weeks.    [name], DO 11/12/2013, 10:38        Visit Diagnoses and Associated Orders, Previous Medication List, Allergies -- Summary:        ICD-9-CM     1. Mechanical heart valve present V43.3 POCT EAST PROTHROMBIN TIME (AMB)           Medications:  Current Outpatient Prescriptions   Medication Sig    cyclobenzaprine (FLEXERIL) 5 mg Oral Tablet Take 1-2 tablets up to 3x per day as needed for tension headache.    escitalopram oxalate (LEXAPRO) 20 mg Oral Tablet Take 1 Tab (20 mg total) by mouth Once a day    levETIRAcetam (KEPPRA) 500 mg Oral Tablet Take 2 Tabs (1,000 mg total) by mouth Twice daily    OXcarbazepine (TRILEPTAL) 600 mg Oral Tablet Take 1 Tab (600 mg total) by mouth Twice daily    tamsulosin (FLOMAX) 0.4 mg Oral Capsule, Sust. Release 24 hr Take 1 Cap (0.4 mg total) by mouth Every evening after dinner    warfarin (COUMADIN) 10 mg Oral Tablet Take 10 mg by mouth Every Sunday, Tuesday and Thursday At HS    warfarin (COUMADIN) 7.5 mg Oral Tablet Take 7.5 mg by mouth Once a day At HS        Allergies:  Allergies   Allergen Reactions    Morphine  Other Adverse Reaction (Add comment)     combative

## 2013-11-12 NOTE — Progress Notes (Signed)
BP 134/80 mmHg   Pulse 76   Temp(Src) 37.2 C (99 F) (Oral)   Resp 16   Ht 1.803 m (5\' 11" )   Wt 102.059 kg (225 lb)   BMI 31.39 kg/m2  Vitals obtained by Estanislado Spire Berk[place], [place]  11/12/2013, 10:26

## 2013-11-12 NOTE — Patient Instructions (Signed)
MECHANICAL AORTIC VALVE - Continue current dosing but check next INR in 2 weeks. May need to come down in dosing - encouraged to use Trazodone regularly qhs so INR will be predictable.   INSOMNIA - review sleep hygeine. Get back on a schedule.  Take 100 mg Trazodone every night at 10pm.  Plans to go to bed at 10:30.   JOINT PAIN- probably Osteoarthritis. Tylenol or ibuprofen as needed (max 3,000mg  daily).  Plan for right shoulder steroid injection at f/u visit in 2 weeks.

## 2013-11-26 ENCOUNTER — Ambulatory Visit (INDEPENDENT_AMBULATORY_CARE_PROVIDER_SITE_OTHER): Payer: MEDICAID

## 2013-11-26 ENCOUNTER — Encounter (INDEPENDENT_AMBULATORY_CARE_PROVIDER_SITE_OTHER): Payer: Self-pay

## 2013-11-26 VITALS — BP 124/76 | HR 76 | Temp 98.3°F | Resp 16 | Ht 71.0 in | Wt 229.0 lb

## 2013-11-26 DIAGNOSIS — Z5181 Encounter for therapeutic drug level monitoring: Secondary | ICD-10-CM

## 2013-11-26 DIAGNOSIS — M25511 Pain in right shoulder: Secondary | ICD-10-CM

## 2013-11-26 DIAGNOSIS — M25519 Pain in unspecified shoulder: Secondary | ICD-10-CM

## 2013-11-26 DIAGNOSIS — Z7901 Long term (current) use of anticoagulants: Secondary | ICD-10-CM

## 2013-11-26 LAB — POCT EAST PROTHROMBIN TIME (AMB)
INR: 3.2
PROTHROMBIN TIME: 31.6

## 2013-11-26 NOTE — Patient Instructions (Addendum)
MECHANICAL AORTIC VALVE - hold dose of coumadin tonight.  Then start 7.5mg  daily except Monday 5 mg (cut 10 mg in half).  Recheck in 2 weeks.      Joint Injection  Care After  Refer to this sheet in the next few days. These instructions provide you with information on caring for yourself after you have had a joint injection. Your caregiver also may give you more specific instructions. Your treatment has been planned according to current medical practices, but problems sometimes occur. Call your caregiver if you have any problems or questions after your procedure.  After any type of joint injection, it is not uncommon to experience:   Soreness, swelling, or bruising around the injection site.   Mild numbness, tingling, or weakness around the injection site caused by the numbing medicine used before or with the injection.  It also is possible to experience the following effects associated with the specific agent after injection:   Iodine-based contrast agents:   Allergic reaction (itching, hives, widespread redness, and swelling beyond the injection site).   Corticosteroids (These effects are rare.):   Allergic reaction.   Increased blood sugar levels (If you have diabetes and you notice that your blood sugar levels have increased, notify your caregiver).   Increased blood pressure levels.   Mood swings.   Hyaluronic acid in the use of viscosupplementation.   Temporary heat or redness.   Temporary rash and itching.   Increased fluid accumulation in the injected joint.  These effects all should resolve within a day after your procedure.   HOME CARE INSTRUCTIONS   Limit yourself to light activity the day of your procedure. Avoid lifting heavy objects, bending, stooping, or twisting.   Take prescription or over-the-counter pain medication as directed by your caregiver.   You may apply ice to your injection site to reduce pain and swelling the day of your procedure. Ice may be applied 03-04 times:   Put  ice in a plastic bag.   Place a towel between your skin and the bag.   Leave the ice on for no longer than 15-20 minutes each time.  SEEK IMMEDIATE MEDICAL CARE IF:    Pain and swelling get worse rather than better or extend beyond the injection site.   Numbness does not go away.   Blood or fluid continues to leak from the injection site.   You have chest pain.   You have swelling of your face or tongue.   You have trouble breathing or you become dizzy.   You develop a fever, chills, or severe tenderness at the injection site that last longer than 1 day.  MAKE SURE YOU:   Understand these instructions.   Watch your condition.   Get help right away if you are not doing well or if you get worse.  Document Released: 12/13/2010 Document Revised: 06/24/2011 Document Reviewed: 12/13/2010  [facility] Patient Information 2015 [place], [place]. This information is not intended to replace advice given to you by your health care provider. Make sure you discuss any questions you have with your health care provider.

## 2013-11-26 NOTE — Progress Notes (Signed)
11/26/13 1100   [facility] PT/INR   Date INR drawn 11/26/13   PT 31.6   INR 3.2   Initials JLS     BP 124/76 mmHg   Pulse 76   Temp(Src) 36.8 C (98.3 F) (Oral)   Resp 16   Ht 1.803 m (5\' 11" )   Wt 103.874 kg (229 lb)   BMI 31.95 kg/m2  Cruise Baumgardner L. Skyley Grandmaison, LPN  3/81/8299, 37:16

## 2013-11-26 NOTE — Progress Notes (Signed)
[facility] FO[facility]IS[facility]    SUBJECTIVE:  [name] is a 63 y.o. male with a chief complaint of   Chief Complaint   Patient presents with    Anticoagulation    Shoulder Pain     MECHANICAL AORTIC VALVE  Goal was tight control 2.0-2.5  No missed coumadin doses  7.5mg  nightly  Did take 2 trazodone two nights in the past week (this has an antiplatelet therapy).  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 Thoughts logical and goal oriented, well groomed and appropriately dressed. Moderate eye contact. Insight good.  MSK: Job Empty Can, Hawkins and Neer negative.  Appley scratch to T12 on right and T8 on left.  Muscle strength 5/5 shoulder adduction/abduction, flexion and extension.    LUNGS: CTAB no w/r/r  CV: RRR, s1 and s2 audible click.    PROCEDURE:  Verbal consent obtained.  Depomedrol 80mg  to right shoulder posterior approach. Well tolerated.   See procedure note.      ASSESSMENT and PLAN:  MECHANICAL AORTIC VALVE - hold dose of coumadin tonight.  Then start 7.5mg  daily except Monday 5 mg (cut 10 mg in half).  [name]. Caution re steroid flare. Warning signs of infection  reviewed.  Ruben Askew, DO 11/26/2013, 12:03         Visit Diagnoses and Associated Orders, Previous Medication List, Allergies -- Summary:        ICD-9-CM    1. Anticoagulation goal of INR 2 to 2.5 V58.83 POCT EAST PROTHROMBIN TIME (AMB)    V58.61            Medications:  Current Outpatient Prescriptions   Medication Sig    cyclobenzaprine (FLEXERIL) 5 mg Oral Tablet Take 1-2 tablets up to 3x per day as needed for tension headache.    escitalopram oxalate (LEXAPRO) 20 mg Oral Tablet Take 1 Tab (20 mg total) by mouth Once a day    levETIRAcetam (KEPPRA) 500 mg Oral Tablet Take 2 Tabs (1,000 mg total) by mouth Twice daily    OXcarbazepine (TRILEPTAL) 600 mg Oral Tablet Take 1 Tab (600 mg total) by mouth Twice daily    tamsulosin (FLOMAX) 0.4 mg Oral Capsule, Sust. Release 24 hr Take 1 Cap (0.4 mg total) by mouth Every evening after dinner    traZODone (DESYREL) 50 mg Oral Tablet Take 2 Tabs (100 mg total) by mouth Every night    warfarin (COUMADIN) 7.5 mg Oral Tablet Take 7.5 mg by mouth  Once a day At HS        Allergies:  Allergies   Allergen Reactions    Morphine  Other Adverse Reaction (Add comment)     combative

## 2013-11-30 ENCOUNTER — Other Ambulatory Visit (INDEPENDENT_AMBULATORY_CARE_PROVIDER_SITE_OTHER): Payer: Self-pay

## 2013-12-06 ENCOUNTER — Other Ambulatory Visit (INDEPENDENT_AMBULATORY_CARE_PROVIDER_SITE_OTHER): Payer: Self-pay

## 2013-12-10 ENCOUNTER — Encounter (INDEPENDENT_AMBULATORY_CARE_PROVIDER_SITE_OTHER): Payer: Self-pay | Admitting: Family Medicine

## 2013-12-10 ENCOUNTER — Ambulatory Visit (INDEPENDENT_AMBULATORY_CARE_PROVIDER_SITE_OTHER): Payer: MEDICAID | Admitting: Family Medicine

## 2013-12-10 VITALS — BP 120/78 | HR 80 | Temp 98.7°F | Resp 16 | Ht 71.0 in | Wt 227.0 lb

## 2013-12-10 DIAGNOSIS — G40909 Epilepsy, unspecified, not intractable, without status epilepticus: Secondary | ICD-10-CM

## 2013-12-10 DIAGNOSIS — Z952 Presence of prosthetic heart valve: Secondary | ICD-10-CM

## 2013-12-10 DIAGNOSIS — Z954 Presence of other heart-valve replacement: Secondary | ICD-10-CM

## 2013-12-10 LAB — POCT EAST PROTHROMBIN TIME (AMB)
INR: 2.6
PROTHROMBIN TIME: 26.4

## 2013-12-10 MED ORDER — LEVETIRACETAM 1,000 MG TABLET
1000.0000 mg | ORAL_TABLET | Freq: Two times a day (BID) | ORAL | Status: DC
Start: 2013-12-10 — End: 2014-06-30

## 2013-12-10 NOTE — Progress Notes (Signed)
12/10/13 1300   [facility] PT/INR   Date INR drawn 12/10/13   PT 26.4   INR 2.6   Initials [address] [place], [place]  12/10/2013, 13:56

## 2013-12-10 NOTE — Progress Notes (Addendum)
Subjective:    [name] is a 63 y.o. male here for management of coumadin for anticoagulation for mechanical valve. Also requesting refill for Keppra for epilepsy. No recent seizures.    Bleeding Signs/Symptoms:  None  Thromboembolic Signs/Symptoms:  None    Missed Coumadin Doses:  None  Medication Changes:  no  Dietary Changes:  no  Bacterial/Viral Infection:  no    Other Concerns:  no    Objective:   Vitals: BP 120/78 mmHg   Pulse 80   Temp(Src) 37.1 C (98.7 F) (Oral)   Resp 16   Ht 1.803 m (5\' 11" )   Wt 102.967 kg (227 lb)   BMI 31.67 kg/m2  General: appears in good health  Cardiovascular:    Heart regular rate and rhythm  Extremities: no cyanosis or edema  Skin: Skin warm and dry    INR Today:  2.6  Current Dose:  7.5 mg daily    Assessment:     Therapeutic INR for goal of 2.5-3.5    Plan:   1. New Dose: no change    2. Next INR: 1 month  3. Keppra refilled      ICD-9-CM    1. Mechanical heart valve present V43.3 POCT EAST PROTHROMBIN TIME (AMB)   2. Epilepsy 345.90 levETIRAcetam (KEPPRA) 1,000 mg Oral Tablet         Follow-up in 4 weeks.    Attending: Dr. Maurine Cane, MD 12/10/2013, 14:20    I discussed the patient with the resident.  I reviewed the resident's note.  I agree with the findings and plan of care as documented in the resident's note.  Any exceptions/additions are edited/noted.    Collier Flowers, MD  01/07/2014, 18:49

## 2013-12-10 NOTE — Progress Notes (Signed)
BP 120/78 mmHg   Pulse 80   Temp(Src) 37.1 C (98.7 F) (Oral)   Resp 16   Ht 1.803 m (5\' 11" )   Wt 102.967 kg (227 lb)   BMI 31.67 kg/m2  Oneita Kras, [place]  12/10/2013, 13:53

## 2013-12-19 NOTE — Procedures (Signed)
[facility] Joint Injection Procedure Note    Pre-operative Diagnosis: right shoulder pain    Post-operative Diagnosis: same    Anesthesia: none    Injection:  Depomedrol 80 mg + 49ml lidocaine 1% without epi to right shoulder posterior approach.     Procedure Details     Verbal consent was obtained for the procedure and risks benefits were discussed (bleeding, infection, pain, elevated blood glucose, steroid flare). Anatomic landmarks of right shoulder AC joint, humeral head & acromion were located and marked. The skin prepped with alcohol.  A 25g needle was advanced into the right shoulder without difficulty. After the aspiration of a small amount of synovial fluid, a total of 3 ml was injected into the joint. A band-aid was applied. The joint was then taken through it's full range of motion. Blood loss <5cc.     Complications: None; patient tolerated the procedure well. Instructed to contact medical advice if has signs of infection - erythema, more swelling, fevers, or red line on skin. Return to if not improved in one month.    [name], DO  12/19/2013, 16:39

## 2014-01-06 ENCOUNTER — Ambulatory Visit (INDEPENDENT_AMBULATORY_CARE_PROVIDER_SITE_OTHER): Payer: Medicaid Other | Admitting: Family Medicine

## 2014-01-06 ENCOUNTER — Encounter (INDEPENDENT_AMBULATORY_CARE_PROVIDER_SITE_OTHER): Payer: Self-pay | Admitting: Family Medicine

## 2014-01-06 VITALS — BP 112/70 | HR 78 | Temp 98.7°F | Resp 16 | Ht 72.0 in | Wt 226.0 lb

## 2014-01-06 DIAGNOSIS — Z5181 Encounter for therapeutic drug level monitoring: Secondary | ICD-10-CM

## 2014-01-06 DIAGNOSIS — Z7901 Long term (current) use of anticoagulants: Secondary | ICD-10-CM

## 2014-01-06 DIAGNOSIS — Z6830 Body mass index (BMI) 30.0-30.9, adult: Secondary | ICD-10-CM

## 2014-01-06 LAB — POCT EAST PROTHROMBIN TIME (AMB)
INR: 2.8
PROTHROMBIN TIME: 27.5

## 2014-01-06 NOTE — Progress Notes (Signed)
BP 112/70 mmHg   Pulse 78   Temp(Src) 37.1 C (98.7 F) (Oral)   Resp 16   Ht 1.829 m (6')   Wt 102.513 kg (226 lb)   BMI 30.64 kg/m2   Durene Cal, MA  01/06/2014, 11:29

## 2014-01-06 NOTE — Progress Notes (Signed)
01/06/14 1100   [facility] PT/INR   Date INR drawn 01/06/14   PT 27.5   INR 2.8   Initials hs   Durene Cal, [place]  01/06/2014, 11:31

## 2014-01-06 NOTE — Progress Notes (Addendum)
Subjective:    [name] is a 63 y.o. male here for management of coumadin for anticoagulation.      Indication: mechanical valve  Bleeding Signs/Symptoms:  None  Thromboembolic Signs/Symptoms:  None    Missed Coumadin Doses:  None  Medication Changes:  no  Dietary Changes:  no  Bacterial/Viral Infection:  no    Other Concerns:  no    Objective:   Vitals: BP 112/70 mmHg   Pulse 78   Temp(Src) 37.1 C (98.7 F) (Oral)   Resp 16   Ht 1.829 m (6')   Wt 102.513 kg (226 lb)   BMI 30.64 kg/m2  General: appears in good health and no distress  Cardiovascular:    Heart regular rate and rhythm  Extremities: no cyanosis or edema  Skin: Skin warm and dry and No rashes    INR Today:  2.8  Current Dose:  7.5 mg daily    Assessment:     Therapeutic INR for goal of 2.5-3.5    Plan:   1. New Dose: no change    2. Next INR: 1 month      ICD-9-CM    1. Anticoagulation goal of INR 2 to 2.5 V58.83 POCT EAST PROTHROMBIN TIME (AMB)    V58.61          Follow-up in 1 month    Attending: Dr. Hartford Poli, MD 01/10/2014, 10:21    I discussed the patient's care with the Resident prior to the patient leaving the clinic. Any significant discussion points are noted .    Hoy Register, MD 01/10/2014, 13:11

## 2014-01-07 ENCOUNTER — Other Ambulatory Visit (INDEPENDENT_AMBULATORY_CARE_PROVIDER_SITE_OTHER): Payer: Self-pay

## 2014-01-07 MED ORDER — TAMSULOSIN 0.4 MG CAPSULE
0.4000 mg | ORAL_CAPSULE | Freq: Every evening | ORAL | Status: DC
Start: 2014-01-07 — End: 2014-07-13

## 2014-01-17 ENCOUNTER — Other Ambulatory Visit: Payer: Self-pay

## 2014-02-04 ENCOUNTER — Other Ambulatory Visit: Payer: Self-pay

## 2014-02-04 ENCOUNTER — Ambulatory Visit
Admission: RE | Admit: 2014-02-04 | Discharge: 2014-02-04 | Disposition: A | Discharge status: Home / Self Care | Payer: BC Managed Care – PPO | Attending: Family Medicine | Admitting: Family Medicine

## 2014-02-04 ENCOUNTER — Encounter (INDEPENDENT_AMBULATORY_CARE_PROVIDER_SITE_OTHER): Payer: Self-pay

## 2014-02-04 ENCOUNTER — Ambulatory Visit (INDEPENDENT_AMBULATORY_CARE_PROVIDER_SITE_OTHER): Payer: Self-pay

## 2014-02-04 VITALS — BP 130/70 | HR 64 | Temp 97.6°F | Resp 16 | Ht 72.0 in | Wt 238.0 lb

## 2014-02-04 DIAGNOSIS — Z5181 Encounter for therapeutic drug level monitoring: Secondary | ICD-10-CM | POA: Insufficient documentation

## 2014-02-04 DIAGNOSIS — Z7901 Long term (current) use of anticoagulants: Secondary | ICD-10-CM

## 2014-02-04 DIAGNOSIS — G4700 Insomnia, unspecified: Secondary | ICD-10-CM

## 2014-02-04 DIAGNOSIS — Z23 Encounter for immunization: Principal | ICD-10-CM

## 2014-02-04 DIAGNOSIS — K625 Hemorrhage of anus and rectum: Secondary | ICD-10-CM

## 2014-02-04 DIAGNOSIS — Z952 Presence of prosthetic heart valve: Secondary | ICD-10-CM

## 2014-02-04 DIAGNOSIS — Z954 Presence of other heart-valve replacement: Secondary | ICD-10-CM

## 2014-02-04 DIAGNOSIS — Z6832 Body mass index (BMI) 32.0-32.9, adult: Secondary | ICD-10-CM

## 2014-02-04 LAB — CBC
BASOPHIL #: 0.1 K/uL (ref 0.0–0.10)
BASOPHILS %: 0.6 % (ref 0–2.50)
EOSINOPHIL #: 0.1 10*3/uL (ref 0.00–0.50)
EOSINOPHIL %: 0.8 % (ref 0.0–5.2)
HCT: 44.1 % (ref 40.0–54.0)
HGB: 14.5 g/dL (ref 13.7–18.0)
LYMPHOCYTE #: 1.9 10*3/uL (ref 0.7–3.20)
LYMPHOCYTE %: 17.9 % (ref 15.0–43.0)
MCH: 30.8 pg (ref 28.3–34.3)
MCHC: 33 g/dL (ref 32.0–36.0)
MCV: 93.5 fL (ref 82.0–100.0)
MONOCYTE #: 1 10*3/uL — ABNORMAL HIGH (ref 0.20–0.90)
MONOCYTE %: 9.1 % (ref 4.8–12.0)
MPV: 8.9 fL (ref 7.4–10.45)
NRBC ABSOLUTE: 0.01 10*3/uL (ref 0–0.02)
NRBC: 0.1 /100{WBCs} (ref 0–0.6)
PLATELET COUNT: 155 K/uL (ref 150–400)
PMN #: 7.5 10*3/uL — ABNORMAL HIGH (ref 1.5–6.5)
PMN %: 71.6 % (ref 43.0–76.0)
RBC: 4.71 M/uL (ref 4.5–6.0)
RDW: 14.2 % (ref 11.0–16.0)
WBC: 10.5 K/uL (ref 4.0–11.0)

## 2014-02-04 LAB — PT/INR
INR: 2.54
PROTHROMBIN TIME: 26.7 s — ABNORMAL HIGH (ref 9.2–12.3)

## 2014-02-04 MED ORDER — TRAZODONE 50 MG TABLET
100.0000 mg | ORAL_TABLET | Freq: Every evening | ORAL | Status: DC
Start: 2014-02-04 — End: 2014-05-08
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
## 2014-02-04 NOTE — Progress Notes (Addendum)
[facility]  Anticoagulation Encounter      Patient ID: [name] is a 63 y.o. male   DOB: 06-23-1950   Date of Service: 02/04/2014     Subjective:      Indication: mechanical valve   Bleeding Signs/Symptoms:  Yes -  Rectal bleeding, took Advil for back pains 2 days ago pain now is resolved - was moving barrels with dirt.   Thromboembolic Signs/Symptoms:  None    Missed Coumadin Doses:  This week - did not take pill last night as had taken Advil    Medication Changes:  no  Dietary Changes:  no  Bacterial/Viral Infection:  no    Other Concerns:  Had bloody loose stools yesterday, fresh blood 1/2 cup, twice, has hemorrhoids but has not had issues, had rectal bleeding in the past and had high INR at the time around a year ago , had colonoscopy and surgery due to hx of repeated incidence of diverticulitis      Objective:     BP 130/70 mmHg   Pulse 64   Temp(Src) 36.4 C (97.6 F) (Oral)   Resp 16   Ht 1.829 m (6')   Wt 107.956 kg (238 lb)   BMI 32.27 kg/m2   General: Pleasant, well-appearing male, no acute distress.    Heart: Regular rate and rhythm without murmur or gallops.  Lungs: Clear to auscultation bilaterally without wheeze.  Abdomen: Soft, nontender, nondistended.  Bowel sounds positive.  No masses or organomegally.  Rectal exam: blood noted around the anus and is mixed with stool , dark color no obvious lesions or tear      POCT INR Today: 2.7   Current Coumadin Dose: 7,5 mg daily        Assessment:     1. Therapeutic INR for goal of 2-3  2. Bleeding PR, checked cbc and INR tonight, H/H 14.5/44.1 repeat INR 2.5 tonight, possible concern re GI bleed      Plan:      New Dose: stop coumadin until Monday 02/07/2014 and review patient then   Next INR: 02/05/2014 with cbc in [facility], patient will contact [facility] team for review. Dr Everlean Patterson was contacted as she is on tomorrow and will discuss lab and management with the patient when he arrives , see patient Monday if H/H and INR stable , patient will  need review by surgery ASAP next week, suggest to contact Dr Gerhard Perches and Dr Kenton Kingfisher at that time     Level III visit    Afshin Madelynn Done, MD 02/04/2014 20:48      Patient seen by Dr Nuala Alpha          Encounter Date: 02/04/2014    I saw and examined the patient and discussed management with the resident. I reviewed the resident's note and agree with the documented findings and plan of care except as noted below:      Nuala Alpha, MD

## 2014-02-04 NOTE — Progress Notes (Signed)
02/04/14 1500   [facility] PT/INR   Date INR drawn 02/04/14   PT 28.4   INR 2.7   Initials mlb

## 2014-02-05 ENCOUNTER — Ambulatory Visit
Admission: RE | Admit: 2014-02-05 | Discharge: 2014-02-05 | Disposition: A | Discharge status: Home / Self Care | Payer: BC Managed Care – PPO | Source: Ambulatory Visit | Attending: Family Medicine | Admitting: Family Medicine

## 2014-02-05 DIAGNOSIS — K625 Hemorrhage of anus and rectum: Secondary | ICD-10-CM | POA: Insufficient documentation

## 2014-02-05 LAB — PT/INR
INR: 1.88
PROTHROMBIN TIME: 19.7 s (ref 9.2–12.3)

## 2014-02-05 LAB — CBC
BASOPHIL #: 0.1 10*3/uL (ref 0.0–0.10)
BASOPHILS %: 1 % (ref 0–2.50)
EOSINOPHIL #: 0.1 K/uL (ref 0.00–0.50)
EOSINOPHIL %: 1.7 % (ref 0.0–5.2)
HCT: 42.8 % (ref 40.0–54.0)
HGB: 13.9 g/dL (ref 13.7–18.0)
LYMPHOCYTE #: 1.3 K/uL (ref 0.7–3.20)
LYMPHOCYTE %: 16.2 % (ref 15.0–43.0)
MCH: 30.6 pg (ref 28.3–34.3)
MCHC: 32.5 g/dL (ref 32.0–36.0)
MCV: 94 fL (ref 82.0–100.0)
MONOCYTE #: 0.8 K/uL (ref 0.20–0.90)
MONOCYTE %: 9.9 % (ref 4.8–12.0)
MPV: 9 fL (ref 7.4–10.45)
NRBC ABSOLUTE: 0.01 K/uL (ref 0–0.02)
NRBC: 0.1 /100{WBCs} (ref 0–0.6)
PLATELET COUNT: 150 K/uL (ref 150–400)
PMN #: 5.5 K/uL (ref 1.5–6.5)
PMN %: 71.2 % (ref 43.0–76.0)
RBC: 4.55 M/uL (ref 4.5–6.0)
RDW: 14.3 % (ref 11.0–16.0)
WBC: 7.8 10*3/uL (ref 4.0–11.0)

## 2014-02-07 ENCOUNTER — Ambulatory Visit (INDEPENDENT_AMBULATORY_CARE_PROVIDER_SITE_OTHER): Payer: BC Managed Care – PPO | Admitting: Family Medicine

## 2014-02-07 DIAGNOSIS — K625 Hemorrhage of anus and rectum: Secondary | ICD-10-CM

## 2014-02-07 DIAGNOSIS — Z5181 Encounter for therapeutic drug level monitoring: Secondary | ICD-10-CM

## 2014-02-07 DIAGNOSIS — Z952 Presence of prosthetic heart valve: Secondary | ICD-10-CM

## 2014-02-07 DIAGNOSIS — Z954 Presence of other heart-valve replacement: Secondary | ICD-10-CM

## 2014-02-07 DIAGNOSIS — Z7901 Long term (current) use of anticoagulants: Secondary | ICD-10-CM

## 2014-02-07 LAB — POCT EAST PROTHROMBIN TIME (AMB)
INR: 18.1
PROTHROMBIN TIME: 1.7

## 2014-02-07 NOTE — Progress Notes (Signed)
02/07/14 1100   [facility] PT/INR   Date INR drawn 02/07/14   PT 18.1   INR 1.7   Initials cw

## 2014-02-07 NOTE — Progress Notes (Signed)
Subjective:      Patient presented for a nurse visit for INR check today. I had them add him to my schedule instead as I needed to do quite a bit of chart review to determine what was going on with him.    Indication: AVR  Bleeding Signs/Symptoms:  None. Episodes of rectal bleeding last week have stopped. Normal non-bloody Bms since then. Has been off coumadin since Friday due to the bleeding. (3d)  He does have a hx of recurrent diverticulitis with partial colectomy in 2010. Had rectal bleeding once since then but at that time his INR was >5. Has not had a colonoscopy since his colectomy in 2010.     Thromboembolic Signs/Symptoms:  None    Missed Coumadin Doses:  This week - stopped 3d ago as above  Medication Changes:  no  Dietary Changes:  no  Bacterial/Viral Infection:  no    Other Concerns:  no    Patient Active Problem List   Diagnosis    Mechanical heart valve present    H/O diverticulitis of colon    Epilepsy    Status post lobectomy of brain    Tension headache, chronic    Insomnia    Joint pain    Anticoagulation goal of INR 2 to 3       Outpatient Prescriptions Prior to Visit:  escitalopram oxalate (LEXAPRO) 20 mg Oral Tablet Take 1 Tab (20 mg total) by mouth Once a day   levETIRAcetam (KEPPRA) 1,000 mg Oral Tablet Take 1 Tab (1,000 mg total) by mouth Twice daily   OXcarbazepine (TRILEPTAL) 600 mg Oral Tablet Take 1 Tab (600 mg total) by mouth Twice daily   tamsulosin (FLOMAX) 0.4 mg Oral Capsule, Sust. Release 24 hr Take 1 Cap (0.4 mg total) by mouth Every evening after dinner   traZODone (DESYREL) 50 mg Oral Tablet Take 2 Tabs (100 mg total) by mouth Every night for 30 days   warfarin (COUMADIN) 7.5 mg Oral Tablet Take 1 Tab (7.5 mg total) by mouth Once a day     No facility-administered medications prior to visit.      Objective:   There were no vitals taken for this visit.    Heart: RRR, harsh systolic murmur at RUSB typical for mechanical heart valve.   Lungs: CTAB    INR Today:  1.7  Current  Dose:  Off since Friday (7.5mg  daily prior to that with a therapeutic INR of 2.7 on that dose)    Assessment:   Subtherapeutic INR for goal of 2-3      ICD-10-CM    1. Mechanical heart valve present Z95.4    2. Anticoagulation goal of INR 2 to 3 Z51.81 POCT EAST PROTHROMBIN TIME (AMB)    Z79.01    3. Rectal bleeding K62.5      Plan:   Rectal bleeding has resolved. Will restart coumadin at prior dose of 7.5mg  daily  -Return if rectal bleeding recurs  -No more advil (he had taken 2 the night prior to the rectal bleeding)  -In the meantime, he will get his colonoscopy scheduled as recommended last week  -Next INR 10M    [id number]    Hoy Register, MD 02/07/2014, 11:40

## 2014-02-10 ENCOUNTER — Encounter (INDEPENDENT_AMBULATORY_CARE_PROVIDER_SITE_OTHER): Payer: Medicaid Other | Admitting: Family Medicine

## 2014-03-09 ENCOUNTER — Other Ambulatory Visit (INDEPENDENT_AMBULATORY_CARE_PROVIDER_SITE_OTHER): Payer: Self-pay

## 2014-04-01 ENCOUNTER — Encounter (INDEPENDENT_AMBULATORY_CARE_PROVIDER_SITE_OTHER): Payer: Self-pay

## 2014-04-01 ENCOUNTER — Ambulatory Visit (INDEPENDENT_AMBULATORY_CARE_PROVIDER_SITE_OTHER): Payer: BC Managed Care – PPO

## 2014-04-01 VITALS — BP 126/82 | HR 88 | Temp 98.2°F | Resp 16 | Ht 71.0 in | Wt 244.0 lb

## 2014-04-01 DIAGNOSIS — Z6834 Body mass index (BMI) 34.0-34.9, adult: Secondary | ICD-10-CM

## 2014-04-01 DIAGNOSIS — Z7901 Long term (current) use of anticoagulants: Secondary | ICD-10-CM

## 2014-04-01 DIAGNOSIS — Z5181 Encounter for therapeutic drug level monitoring: Secondary | ICD-10-CM
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
## 2014-04-01 NOTE — Progress Notes (Addendum)
[facility]  Anticoagulation Encounter      Patient ID: [name] is a 63 y.o. male   DOB: 06-Oct-1950   Date of Service: 04/01/2014     Subjective:       Indication:  Mechanical Valve    Bleeding Signs/Symptoms:  None  Thromboembolic Signs/Symptoms:  None    Missed Coumadin Doses:  None  Medication Changes:  Trazodone 2 mg at night last month (increased from 1 mg nightly)    Dietary Changes:  no  Bacterial/Viral Infection:  no    Other Concerns:  no    Objective:     BP 126/82 mmHg   Pulse 88   Temp(Src) 36.8 C (98.2 F) (Oral)   Resp 16   Ht 1.803 m (5\' 11" )   Wt 110.678 kg (244 lb)   BMI 34.05 kg/m2   General: Alert, Well-developed and Well nourished  CV:   RRR, harsh systolic murmur at RUSB typical for mechanical heart valve.      POCT INR Today: 4  Current Coumadin Dose:  7.5 mg daily       Assessment:     1. Supratherapeutic INR for goal of 2-2.5   2. Elevated INR - not from trazodone as causes decrease in anticoagulation     Plan:      New Dose: 7.5 mg daily for 6 days and 5 mg one day a week, repeat for 2 weeks     Next INR: 2 weeks       Level III visit    Afshin Madelynn Done, MD 04/01/2014 15:12      Discussed with Dr Nuala Alpha MD     Encounter Date: 04/01/2014    I discussed management with the resident/fellow. I reviewed the resident's/fellow's note and agree with the documented findings and plan of care except as noted below:      Nuala Alpha, MD

## 2014-04-01 NOTE — Patient Instructions (Addendum)
Goals     . 2 <= INR <= 2.5        Medicine Dose: 7.5mg  daily    Identified Barriers: (Identification of barriers with regard to meeting goals and complying with plan.)    No identified barriers    Patient preference and functional lifestyle goal: Unknown    Dietary changes can affect the anticoagulation levels, so please try to keep your diet consistent. Let your provider know if you have dietary changes.  Notify your provider of any medication changes especially from other providers.    Follow up plan: RTC in 4 weeks for INR check. (Scheduled for 01/06/14)

## 2014-04-26 ENCOUNTER — Ambulatory Visit (INDEPENDENT_AMBULATORY_CARE_PROVIDER_SITE_OTHER): Payer: BC Managed Care – PPO

## 2014-04-26 ENCOUNTER — Encounter (INDEPENDENT_AMBULATORY_CARE_PROVIDER_SITE_OTHER): Payer: Self-pay

## 2014-04-26 VITALS — BP 120/81 | HR 88 | Temp 98.7°F | Resp 16 | Ht 72.0 in | Wt 243.0 lb

## 2014-04-26 DIAGNOSIS — Z7901 Long term (current) use of anticoagulants: Principal | ICD-10-CM

## 2014-04-26 DIAGNOSIS — Z6832 Body mass index (BMI) 32.0-32.9, adult: Secondary | ICD-10-CM

## 2014-04-26 DIAGNOSIS — Z5181 Encounter for therapeutic drug level monitoring: Secondary | ICD-10-CM

## 2014-04-26 DIAGNOSIS — G4700 Insomnia, unspecified: Secondary | ICD-10-CM

## 2014-04-26 LAB — POCT EAST PROTHROMBIN TIME (AMB): PROTHROMBIN TIME: 4.7

## 2014-04-26 NOTE — Progress Notes (Signed)
I discussed the patient with the resident. I reviewed the resident's note. I agree with the findings and plan of care as documented in the resident's note.   1. Anticoagulation  2. Insomnia  Any exceptions/additions are edited/noted.  Dr. Kenton Kingfisher

## 2014-04-26 NOTE — Patient Instructions (Addendum)
Goals     . 2 <= INR <= 3      Medicine Dose: omit next 2 doses and resume with 7.5 daily except 2 days a week take 5 mg      Identified Barriers: (Identification of barriers with regard to meeting goals and complying with plan.)    No identified barriers    Patient preference and functional lifestyle goal: Unknown    Dietary changes can affect the anticoagulation levels, so please try to keep your diet consistent. Let your provider know if you have dietary changes.  Notify your provider of any medication changes especially from other providers.    Follow up plan: RTC in 1 weeks for INR check.

## 2014-04-26 NOTE — Progress Notes (Signed)
04/26/14 1500   Blood   PT/INR (today) 4.7
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
## 2014-04-26 NOTE — Progress Notes (Addendum)
[facility]  [facility]      Patient ID: [name] is a 64 y.o. male   DOB: 05/15/1950   Date of Service: 04/26/2014     Subjective:      Indication:  Mechanical Valves       Bleeding Signs/Symptoms:  None  Thromboembolic Signs/Symptoms:  None    Missed Coumadin Doses:  None    Medication Changes:  No   Dietary Changes:  no  Bacterial/Viral Infection:  No     Other Concerns:   None       Objective:     BP 120/81 mmHg   Pulse 88   Temp(Src) 37.1 C (98.7 F) (Oral)   Resp 16   Ht 1.829 m (6')   Wt 110.224 kg (243 lb)   BMI 32.95 kg/m2   General: Alert, Well-developed and Well nourished  CV: RRR, harsh systolic murmur at RUSB typical for mechanical heart valve.       POCT INR Today: 4.7    Current Coumadin Dose:  7.5 mg daily (patient was to take 7.5 mg daily for 6 days a week and 5 mg daily for 1 day a week as last INR 4 but the past week he took 7.5 mg daily)         Assessment:     1. Supratherapeutic INR for goal of 2-3    Plan:      New Dose: hold coumadin next 2 dose, coumadin 7.5 mg 5 days and week and 5 mg 2 days a week         Next INR: 1 week      Level III visit    Ruben Melina ModenaJavan Wallace, MD 04/26/2014 15:45      Patient discussed with Dr Tiburcio PeaHarris, GReece Agar

## 2014-05-08 ENCOUNTER — Other Ambulatory Visit (INDEPENDENT_AMBULATORY_CARE_PROVIDER_SITE_OTHER): Payer: Self-pay

## 2014-05-08 ENCOUNTER — Other Ambulatory Visit (INDEPENDENT_AMBULATORY_CARE_PROVIDER_SITE_OTHER): Payer: Self-pay | Admitting: Family Medicine

## 2014-05-09 ENCOUNTER — Encounter (INDEPENDENT_AMBULATORY_CARE_PROVIDER_SITE_OTHER): Payer: BC Managed Care – PPO

## 2014-05-19 ENCOUNTER — Encounter (INDEPENDENT_AMBULATORY_CARE_PROVIDER_SITE_OTHER): Payer: Self-pay

## 2014-05-19 ENCOUNTER — Ambulatory Visit (INDEPENDENT_AMBULATORY_CARE_PROVIDER_SITE_OTHER): Payer: BC Managed Care – PPO

## 2014-05-19 VITALS — BP 136/78 | HR 96 | Temp 98.1°F | Resp 16 | Ht 72.0 in | Wt 248.5 lb

## 2014-05-19 DIAGNOSIS — Z7901 Long term (current) use of anticoagulants: Secondary | ICD-10-CM

## 2014-05-19 DIAGNOSIS — Z029 Encounter for administrative examinations, unspecified: Secondary | ICD-10-CM

## 2014-05-19 DIAGNOSIS — Z5181 Encounter for therapeutic drug level monitoring: Secondary | ICD-10-CM

## 2014-05-19 LAB — POCT EAST PROTHROMBIN TIME (AMB)
INR: 4
PROTHROMBIN TIME: 40

## 2014-05-19 NOTE — Progress Notes (Signed)
05/19/14 1400   [facility] PT/INR   Date INR drawn 05/19/14   PT 40.0   INR 4.0   Initials JLS

## 2014-05-31 ENCOUNTER — Ambulatory Visit (INDEPENDENT_AMBULATORY_CARE_PROVIDER_SITE_OTHER): Payer: BC Managed Care – PPO

## 2014-05-31 VITALS — BP 128/86 | HR 80 | Temp 98.2°F | Resp 16 | Ht 72.0 in | Wt 236.0 lb

## 2014-05-31 DIAGNOSIS — R5383 Other fatigue: Secondary | ICD-10-CM

## 2014-05-31 DIAGNOSIS — R791 Abnormal coagulation profile: Secondary | ICD-10-CM

## 2014-05-31 DIAGNOSIS — Z6832 Body mass index (BMI) 32.0-32.9, adult: Secondary | ICD-10-CM

## 2014-05-31 DIAGNOSIS — Z5181 Encounter for therapeutic drug level monitoring: Principal | ICD-10-CM

## 2014-05-31 DIAGNOSIS — Z7901 Long term (current) use of anticoagulants: Principal | ICD-10-CM

## 2014-05-31 LAB — POCT EAST PROTHROMBIN TIME (AMB)
INR: 5.8
PROTHROMBIN TIME: 51.7

## 2014-05-31 NOTE — Progress Notes (Signed)
05/31/14 1700   [facility] PT/INR   PT 51.7   INR 5.8

## 2014-05-31 NOTE — Patient Instructions (Addendum)
Please have your spouse give you all of your medication for now until INR results are checked again this Friday     please don't take coumadin until Friday when we repeat the INR

## 2014-05-31 NOTE — Progress Notes (Signed)
Chief Complaint:   Anticoagulation    Functional Health Screen        Vital Signs  BP 128/86 mmHg   Pulse 80   Temp(Src) 36.8 C (98.2 F) (Oral)   Resp 16   Ht 1.829 m (6')   Wt 107.049 kg (236 lb)   BMI 32.00 kg/m2  History   Smoking status    Current Every Day Smoker -- 1.00 packs/day    Last Attempt to Quit: 06/01/2013   Smokeless tobacco    Never Used     Patient Health Rating                          Allergies:  Allergies   Allergen Reactions    Morphine  Other Adverse Reaction (Add comment)     combative     Medication History  Reviewed for OTC medication and any new medications, provider will review medication history  Results through Enter/Edit  No results found for this or any previous visit (from the past 24 hour(s)).  POCT Results    Care Team  Patient Care Team:  [name], DO as PCP - General [facility] [place])  Oran Rein, MD as PCP - Cardiologist (EXTERNAL)    Ulanda Edison, [place]  05/31/2014, 16:21

## 2014-05-31 NOTE — Progress Notes (Signed)
I discussed the patient with the resident. I reviewed the resident's note. I agree with the findings and plan of care as documented in the resident's note.   Any exceptions/additions are edited/noted.  Patient is confused on his coumadin dosing plus the neurologist has added seizure medications that are likely interfering with his coumadin dosing.  Check H/H. Hold for the next 3 days; recheck INR/Pt on Friday - determine dosing at that time.  Dr. Kenton Kingfisher

## 2014-05-31 NOTE — Progress Notes (Addendum)
[facility]  Anticoagulation Encounter      Patient ID: [name] is a 64 y.o. male   DOB: 03/13/1951   Date of Service: 05/31/2014      Subjective:        Indication: Mechanical Valves  Bleeding Signs/Symptoms:  None  Thromboembolic Signs/Symptoms:  None    Missed Coumadin Doses:   Patient likely was taking 10 mg daily for the past 3 days instead 7.5 daily (may have been confused with recent changes to anti convulsants)    Medication Changes: States anticonvulsants changed recently but not sure of the details    Dietary Changes:  no  Bacterial/Viral Infection:  no    Other Concerns: Patients anti convulsants recently modified and is not sure of the changes, had been confused a few days ago      Objective:     BP 128/86 mmHg   Pulse 80   Temp(Src) 36.8 C (98.2 F) (Oral)   Resp 16   Ht 1.829 m (6')   Wt 107.049 kg (236 lb)   BMI 32.00 kg/m2   General: Alert, Well-developed and Well nourished  CV: RRR, harsh systolic murmur at RUSB typical for mechanical heart valve.      POCT INR Today: 5.8   Current Coumadin Dose:  7.5 mg 5 days and week and 5 mg 2 days a week      Assessment:     1. Supratherapeutic INR for goal of 2-3    Plan:      New Dose:  Hold Coumadin for 3 days until seen Friday       Next INR: 3 days on Friday     All meds to be given by spouse and patient hx of mild confusion until fully resolve    Level III visit    Afshin Madelynn Done, MD 05/31/2014 16:48      Discussed with Dr Kenton Kingfisher

## 2014-06-02 ENCOUNTER — Ambulatory Visit
Admission: RE | Admit: 2014-06-02 | Discharge: 2014-06-02 | Disposition: A | Discharge status: Home / Self Care | Payer: BC Managed Care – PPO | Source: Ambulatory Visit | Attending: Family Medicine | Admitting: Family Medicine

## 2014-06-02 DIAGNOSIS — R5383 Other fatigue: Secondary | ICD-10-CM | POA: Insufficient documentation

## 2014-06-02 DIAGNOSIS — Z5181 Encounter for therapeutic drug level monitoring: Secondary | ICD-10-CM | POA: Insufficient documentation

## 2014-06-02 DIAGNOSIS — Z7901 Long term (current) use of anticoagulants: Secondary | ICD-10-CM

## 2014-06-02 LAB — COMPREHENSIVE METABOLIC PROFILE - BMC/JMC ONLY
ALBUMIN/GLOBULIN RATIO: 1
ALBUMIN: 3.8 g/dL (ref 3.5–5.0)
ALKALINE PHOSPHATASE: 130 IU/L — ABNORMAL HIGH (ref 38–126)
ALT (SGPT): 38 IU/L (ref 17–63)
ANION GAP: 9 mmol/L (ref 3–11)
AST (SGOT): 51 IU/L — ABNORMAL HIGH (ref 15–41)
BILIRUBIN, TOTAL: 1.3 mg/dL — ABNORMAL HIGH (ref 0.3–1.2)
BUN: 27 mg/dL (ref 6–20)
CALCIUM: 8.9 mg/dL (ref 8.8–10.2)
CARBON DIOXIDE: 27 mmol/L (ref 22–32)
CHLORIDE: 102 mmol/L (ref 101–111)
CREATININE: 1.29 mg/dL — ABNORMAL HIGH (ref 0.61–1.24)
ESTIMATED GLOMERULAR FILTRATION RATE: 56 mL/min — ABNORMAL LOW (ref >60)
GLUCOSE: 134 mg/dL — ABNORMAL HIGH (ref 70–110)
POTASSIUM: 4.1 mmol/L (ref 3.4–5.1)
SODIUM: 138 mmol/L (ref 136–145)
TOTAL PROTEIN: 7.6 g/dL (ref 6.4–8.3)

## 2014-06-02 LAB — CBC
BASOPHIL #: 0.1 10*3/uL (ref 0.0–0.10)
BASOPHILS %: 0.8 % (ref 0–2.50)
EOSINOPHIL #: 0.2 K/uL (ref 0.00–0.50)
EOSINOPHIL %: 2.2 % (ref 0.0–5.2)
HCT: 49.2 % — AB (ref 40.0–54.0)
HGB: 16.3 g/dL — AB (ref 13.7–18.0)
LYMPHOCYTE #: 2.2 K/uL (ref 0.7–3.20)
LYMPHOCYTE %: 21.5 % (ref 15.0–43.0)
MCH: 30.3 pg (ref 28.3–34.3)
MCHC: 33.1 g/dL (ref 32.0–36.0)
MCV: 91.5 fL (ref 82.0–100.0)
MONOCYTE #: 1 10*3/uL — ABNORMAL HIGH (ref 0.20–0.90)
MONOCYTE %: 9.9 % (ref 4.8–12.0)
MPV: 9.3 fL (ref 7.4–10.45)
NRBC ABSOLUTE: 0 K/uL (ref 0–0.02)
NRBC: 0 /100{WBCs} (ref 0–0.6)
PLATELET COUNT: 234 10*3/uL (ref 150–400)
PMN #: 6.6 10*3/uL — ABNORMAL HIGH (ref 1.5–6.5)
PMN %: 65.6 % (ref 43.0–76.0)
RBC: 5.38 M/uL (ref 4.5–6.0)
RDW: 14.5 % (ref 11.0–16.0)
WBC: 10 K/uL (ref 4.0–11.0)

## 2014-06-03 ENCOUNTER — Ambulatory Visit (INDEPENDENT_AMBULATORY_CARE_PROVIDER_SITE_OTHER): Payer: BC Managed Care – PPO | Admitting: FAMILY MEDICINE

## 2014-06-03 ENCOUNTER — Encounter (INDEPENDENT_AMBULATORY_CARE_PROVIDER_SITE_OTHER): Payer: Self-pay | Admitting: FAMILY MEDICINE

## 2014-06-03 VITALS — BP 130/86 | HR 81 | Temp 98.1°F | Resp 16 | Ht 72.0 in | Wt 238.4 lb

## 2014-06-03 DIAGNOSIS — Z954 Presence of other heart-valve replacement: Secondary | ICD-10-CM

## 2014-06-03 DIAGNOSIS — Z6832 Body mass index (BMI) 32.0-32.9, adult: Secondary | ICD-10-CM

## 2014-06-03 DIAGNOSIS — Z952 Presence of prosthetic heart valve: Secondary | ICD-10-CM

## 2014-06-03 DIAGNOSIS — Z7901 Long term (current) use of anticoagulants: Principal | ICD-10-CM

## 2014-06-03 DIAGNOSIS — Z5181 Encounter for therapeutic drug level monitoring: Principal | ICD-10-CM

## 2014-06-03 LAB — POCT EAST PROTHROMBIN TIME (AMB)
INR: 2.2
PROTHROMBIN TIME: 20.9

## 2014-06-03 NOTE — Progress Notes (Addendum)
SUBJECTIVE:  [name] is a 64 y.o. male here for INR for [employer]. Jude prosthetic valve. Patient reports that recently had an increase in another medication from another physician, shortly thereafter he had gotten confused and started taking excess Coumadin. His INR became elevated at last appointment, patient was told to hold Coumadin for 3 days and then to resume normal therapy. He is now therapeutic after holding for the last 3 days. Will resume 7.5 mg p.o. nightly at this time. Patient denies any melena, hematochezia, cough with blood, bleeding problems. Denies any chest pain, shortness of breath, nausea, vomiting, diarrhea. Other medical issues include:    Patient Active Problem List   Diagnosis    Mechanical heart valve present    H/O diverticulitis of colon    Epilepsy    Status post lobectomy of brain    Tension headache, chronic    Insomnia    Joint pain    Anticoagulation goal of INR 2 to 3       ROS: Negative other than HPI.  No changes in [place]    History   Smoking status    Current Every Day Smoker -- 1.00 packs/day    Last Attempt to Quit: 06/01/2013   Smokeless tobacco    Never Used       OBJECTIVE:  BP 130/86 mmHg   Pulse 81   Temp(Src) 36.7 C (98.1 F) (Oral)   Resp 16   Ht 1.829 m (6')   Wt 108.138 kg (238 lb 6.4 oz)   BMI 32.33 kg/m2  General: appears in good health  Neck: No JVD or thyromegaly  Lungs: clear to auscultation bilaterally.   Cardiovascular:    Heart regular rate and rhythm, systolic murmur LSB  Extremities: no cyanosis or edema  Skin: Skin warm and dry    ASSESSMENT/PLAN:      ICD-10-CM    1. Anticoagulation goal of INR 2 to 3 Z51.81 POCT EAST PROTHROMBIN TIME (AMB)    Z79.01    2. Mechanical heart valve present Z95.4 POCT EAST PROTHROMBIN TIME (AMB)     This is a 64 year old male with a St. Jude prosthetic heart valve, here for anticoagulation therapy appointment. INR 2.2 today which is therapeutic. Review of medical records reveals that previous goal is 2.5 3.5, however  2.0-3.0 is not considered appropriate. Patient is taking 7.5 mg Coumadin nightly. Recommend repeat in 7-10 days and if still therapeutic continue with monitoring every 4 weeks.  Follow up in 10 day(s).    Zachery Dakins Swalm, DO  06/04/2014, 12:56    I discussed the patient with the resident.  I reviewed the resident's note.  I agree with the findings and plan of care as documented in the resident's note.  Any exceptions/additions are edited/noted.    Collier Flowers, MD  06/26/2014, 01:27
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
## 2014-06-08 ENCOUNTER — Encounter (INDEPENDENT_AMBULATORY_CARE_PROVIDER_SITE_OTHER): Payer: Self-pay | Admitting: Family Medicine

## 2014-06-08 ENCOUNTER — Ambulatory Visit (INDEPENDENT_AMBULATORY_CARE_PROVIDER_SITE_OTHER): Payer: BC Managed Care – PPO | Admitting: Family Medicine

## 2014-06-08 VITALS — BP 110/80 | HR 76 | Temp 98.2°F | Resp 16 | Ht 72.0 in | Wt 235.4 lb

## 2014-06-08 DIAGNOSIS — Z7901 Long term (current) use of anticoagulants: Secondary | ICD-10-CM

## 2014-06-08 DIAGNOSIS — Z952 Presence of prosthetic heart valve: Secondary | ICD-10-CM

## 2014-06-08 DIAGNOSIS — Z6831 Body mass index (BMI) 31.0-31.9, adult: Secondary | ICD-10-CM

## 2014-06-08 DIAGNOSIS — Z5181 Encounter for therapeutic drug level monitoring: Secondary | ICD-10-CM

## 2014-06-08 DIAGNOSIS — Z954 Presence of other heart-valve replacement: Secondary | ICD-10-CM

## 2014-06-08 NOTE — Progress Notes (Signed)
06/08/14 1400   [facility] PT/INR   Date INR drawn 06/08/14   PT 41.2   INR 4.6   Initials yc   Allie Dimmer, [place]  06/08/2014, 14:32
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
## 2014-06-08 NOTE — Progress Notes (Addendum)
Subjective:    [name] is a 64 y.o. male here for management of coumadin for anticoagulation.      Indication: AVR  Bleeding Signs/Symptoms:  None  Thromboembolic Signs/Symptoms:  None    Missed Coumadin Doses:  None  Medication Changes:  Changing trileptal doses  Dietary Changes:  no  Bacterial/Viral Infection:  No     Other Concerns:  Changing trileptal dose as having seizures.    Objective:   Vitals: BP 110/80 mmHg   Pulse 76   Temp(Src) 36.8 C (98.2 F) (Oral)   Resp 16   Ht 1.829 m (6')   Wt 106.777 kg (235 lb 6.4 oz)   BMI 31.92 kg/m2  General: appears in good health and appears stated age  Cardiovascular:    Heart regular rate and rhythm with  4/6  systolic murmur clicking    INR Today:  4.6  Current Dose:  7.5mg  daily    Assessment:     Supratherapeutic INR for goal of 2-3    Plan:   1. New Dose: 7.5mg  TuWFSa and 3.75mg  MTh, skipping dose on 06/08/14    2. Next INR: 1 week at lab then 2 weeks in clinic.      ICD-10-CM    1. Mechanical heart valve present Z95.4 POCT EAST PROTHROMBIN TIME (AMB)     PROTHROMBIN TIME, THERAPEUTIC - [facility] ONLY   2. Anticoagulation goal of INR 2 to 3 Z51.81 POCT EAST PROTHROMBIN TIME (AMB)    Z79.01 PROTHROMBIN TIME, THERAPEUTIC - [facility] ONLY       Attending: Dr. Olevia Bowens, DO 06/08/2014, 14:51      I discussed the patient with the resident.  I reviewed the resident's note.  I agree with the findings and plan of care as documented in the resident's note.  Any exceptions/additions are edited/noted.    Collier Flowers, MD  06/26/2014, 01:36

## 2014-06-09 ENCOUNTER — Other Ambulatory Visit (INDEPENDENT_AMBULATORY_CARE_PROVIDER_SITE_OTHER): Payer: Self-pay | Admitting: Family Medicine

## 2014-06-12 ENCOUNTER — Other Ambulatory Visit (INDEPENDENT_AMBULATORY_CARE_PROVIDER_SITE_OTHER): Payer: Self-pay

## 2014-06-15 ENCOUNTER — Ambulatory Visit
Admission: RE | Admit: 2014-06-15 | Discharge: 2014-06-15 | Disposition: A | Discharge status: Home / Self Care | Payer: BC Managed Care – PPO | Source: Ambulatory Visit | Attending: Family Medicine | Admitting: Family Medicine

## 2014-06-15 ENCOUNTER — Encounter (INDEPENDENT_AMBULATORY_CARE_PROVIDER_SITE_OTHER): Payer: BC Managed Care – PPO

## 2014-06-15 DIAGNOSIS — Z7901 Long term (current) use of anticoagulants: Secondary | ICD-10-CM | POA: Insufficient documentation

## 2014-06-15 DIAGNOSIS — Z954 Presence of other heart-valve replacement: Secondary | ICD-10-CM | POA: Insufficient documentation

## 2014-06-15 DIAGNOSIS — Z5181 Encounter for therapeutic drug level monitoring: Secondary | ICD-10-CM | POA: Insufficient documentation

## 2014-06-15 DIAGNOSIS — Z952 Presence of prosthetic heart valve: Secondary | ICD-10-CM

## 2014-06-15 LAB — PROTHROMBIN TIME, THERAPEUTIC
COUMADIN-LAST DOSE DATE: 3012016
INR: 3.16
PROTHROMBIN TIME: 33.2 s (ref 9.2–12.3)

## 2014-06-15 NOTE — Progress Notes (Signed)
Quick Note:    Tried calling patient, no VM able to be left.  [facility]   Please advise Mr. Ulbricht that his INR was 3.16, we will decrease his coumadin to 7.5 mg on TuWF and 3.57mf on MThSa.  Melonie Florida, DO 06/15/2014, 17:14    ______

## 2014-06-16 NOTE — Progress Notes (Signed)
Quick Note:    Please advise Sunday should be 7.5 mg.  coumadin to 7.5 mg on TuWFSu and 3.75mg  on MThSa.  Thanks.  Melonie Florida, DO 06/16/2014, 14:30    ______

## 2014-06-20 ENCOUNTER — Emergency Department
Admission: EM | Admit: 2014-06-20 | Discharge: 2014-06-20 | Disposition: A | Discharge status: Home / Self Care | Payer: BC Managed Care – PPO | Attending: Emergency Medicine | Admitting: Emergency Medicine

## 2014-06-20 ENCOUNTER — Emergency Department (EMERGENCY_DEPARTMENT_HOSPITAL): Payer: BC Managed Care – PPO

## 2014-06-20 ENCOUNTER — Encounter: Payer: Self-pay | Admitting: Family

## 2014-06-20 DIAGNOSIS — Z8619 Personal history of other infectious and parasitic diseases: Secondary | ICD-10-CM

## 2014-06-20 DIAGNOSIS — F17210 Nicotine dependence, cigarettes, uncomplicated: Secondary | ICD-10-CM

## 2014-06-20 DIAGNOSIS — Z952 Presence of prosthetic heart valve: Secondary | ICD-10-CM | POA: Insufficient documentation

## 2014-06-20 DIAGNOSIS — Z885 Allergy status to narcotic agent status: Secondary | ICD-10-CM | POA: Insufficient documentation

## 2014-06-20 DIAGNOSIS — R0789 Other chest pain: Secondary | ICD-10-CM

## 2014-06-20 DIAGNOSIS — R0781 Pleurodynia: Secondary | ICD-10-CM

## 2014-06-20 MED ORDER — OXYCODONE-ACETAMINOPHEN 5 MG-325 MG TABLET
1.00 | ORAL_TABLET | Freq: Four times a day (QID) | ORAL | Status: DC | PRN
Start: 2014-06-20 — End: 2014-06-22

## 2014-06-20 MED ORDER — OXYCODONE-ACETAMINOPHEN 5 MG-325 MG TABLET
2.0000 | ORAL_TABLET | Freq: Once | ORAL | Status: AC
Start: 2014-06-20 — End: 2014-06-20
  Administered 2014-06-20: 2 via ORAL
  Filled 2014-06-20: qty 2

## 2014-06-20 NOTE — ED Nurses Note (Signed)
Patient discharged home with family.  AVS reviewed with patient/care giver.  A written copy of the AVS and discharge instructions was given to the patient/care giver.  Questions sufficiently answered as needed.  Patient/care giver encouraged to follow up with PCP as indicated.  In the event of an emergency, patient/care giver instructed to call 911 or go to the nearest emergency room.   1 script given.

## 2014-06-20 NOTE — ED Nurses Note (Signed)
Patient presents to ED with bilateral lower rib pain after tripping and falling in the tub.  Patient reports worsening pain today and is concerned especially because he is on counadin and recently had a supratherapeutic INR. Awake and alert male.   No acute distress at triage.

## 2014-06-20 NOTE — ED Provider Notes (Signed)
[facility]  Emergency Department     HISTORY OF PRESENT ILLNESS     Date:  06/20/2014  Patient's Name:  [name]  Date of Birth:  10/17/1950    Patient is a 64 y.o. male presenting with fall.   History provided by:  Patient  Fall  The fall occurred while walking. He fell from a height of 1 to 2 ft. He landed on a hard floor. Pertinent negatives include no fever, no abdominal pain, no nausea, no vomiting, no hematuria and no headaches.      The patient is a 64 y/o male presenting to the ED c/o bilateral lower rib pain after a mechanical fall. He reports he was at his normal state of health when he tripped and fell in the bath tub. No LOC or dizziness. He reports of worsening pain since the fall. The rib pain is a 6/10 in severity, worsened with deep breathing and movement. He is currently on coumadin and had a supratherapeutic INR completed. No fevers, chills, nausea or vomiting. Pt is a current every day smoker.    Review of Systems     Review of Systems   Constitutional: Negative for fever and chills.   HENT: Negative for congestion, rhinorrhea and sore throat.    Respiratory: Negative for cough and shortness of breath.    Cardiovascular: Positive for chest pain (RIB PAIN).   Gastrointestinal: Negative for nausea, vomiting, abdominal pain and diarrhea.   Genitourinary: Negative for dysuria, urgency, frequency and hematuria.   Musculoskeletal: Negative for back pain.   Skin: Negative for rash.   Neurological: Negative for dizziness and headaches.   Psychiatric/Behavioral: Negative for confusion.   All other systems reviewed and are negative.      Previous History     Past Medical History:  Past Medical History   Diagnosis Date    Aortic valve replaced     Diverticulitis     Wears glasses     Abnormal EKG     Seizures     Depression     Anxiety     Rectal bleeding     Kidney stones     Viral hepatitis C     Bladder stone        Past Surgical History:  Past Surgical  History   Procedure Laterality Date    Hx knee replacment Left     Hx gall bladder surgery/chole      Hx aortic valve replacement      Hx colectomy  2010    Hx colonoscopy         Social History:  History   Substance Use Topics    Smoking status: Current Every Day Smoker -- 1.00 packs/day     Last Attempt to Quit: 06/01/2013    Smokeless tobacco: Never Used    Alcohol Use: No     History   Drug Use No       Family History:  Family History   Problem Relation Age of Onset    COPD Mother     Heart Surgery Father      CABG    Prostate Cancer Father 65    Heart Disease Father        Medication History:  Current Outpatient Prescriptions   Medication Sig    escitalopram oxalate (LEXAPRO) 20 mg Oral Tablet Take 1 Tab (20 mg total) by mouth Once a day    levETIRAcetam (KEPPRA) 1,000 mg Oral Tablet  Take 1 Tab (1,000 mg total) by mouth Twice daily    OXcarbazepine (TRILEPTAL) 600 mg Oral Tablet Take 1 Tab (600 mg total) by mouth Twice daily    oxyCODONE-acetaminophen (PERCOCET) 5-325 mg Oral Tablet Take 1 Tab by mouth Every 6 hours as needed for Pain    tamsulosin (FLOMAX) 0.4 mg Oral Capsule, Sust. Release 24 hr Take 1 Cap (0.4 mg total) by mouth Every evening after dinner    traZODone (DESYREL) 50 mg Oral Tablet TAKE 2 TABS (100 MG TOTAL) BY MOUTH EVERY NIGHT FOR 30 DAYS    traZODone (DESYREL) 50 mg Oral Tablet Take 2 Tabs (100 mg total) by mouth Every night as needed for Insomnia    warfarin (COUMADIN) 7.5 mg Oral Tablet Take 1 Tab (7.5 mg total) by mouth Once a day       Allergies:  Allergies   Allergen Reactions    Morphine  Other Adverse Reaction (Add comment)     combative       Physical Exam     Vitals:    BP 111/71 mmHg   Pulse 81   Temp(Src) 36.3 C (97.4 F)   Resp 15   Ht 1.829 m (6' 0.01")   Wt 108.863 kg (240 lb)   BMI 32.54 kg/m2   SpO2 96%    Physical Exam   Nursing note and vitals reviewed.    Constitutional:   Awake & alert.   Head:  Atraumatic.  Normocephalic.    Eyes:  PERRL.  EOMI.   Conjunctivae are not pale.  ENT:  Mucous membranes are moist and intact.  Oropharynx is clear and symmetric.  Patent airway.  Neck:  Supple.  Full ROM.  No JVD.  No lymphadenopathy.  Cardiovascular:  Regular rate.  Regular rhythm.  No murmurs, rubs, or gallops.  Distal pulses are 2+ and symmetric.  Pulmonary/Chest:  No evidence of respiratory distress.  Clear to auscultation bilaterally.  No wheezing, rales or rhonchi. Chest non-tender. Tenderness to bilateral lower ribs. No bruising seen. Prolonged expiratory phase. Heart murmur.   Abdominal:  Soft and non-distended.  There is no tenderness.  No rebound, guarding, or rigidity.  No organomegaly.  Good bowel sounds.    Back:  No CVA tenderness. FROM.   Extremities:  No edema.   No cyanosis.  No clubbing.  Full range of motion in all extremities.  No calf tenderness.  Skin:  Skin is warm and dry.  No diaphoresis. No rash. Abrasion to forearm.  Neurological:  Alert, awake, and appropriate.  Normal speech.  Sensation normal. Motor strengths 5/5. CN II-XII intact.   Psychiatric:  Good eye contact.  Normal interaction, affect, and behavior.    Diagnostic Studies/Treatment     Medications:  Medications   oxyCODONE-acetaminophen (PERCOCET) 5-325mg  per tablet (2 Tabs Oral Given 06/20/14 1648)       Discharge Medication List as of 06/20/2014  6:04 PM      START taking these medications    Details   oxyCODONE-acetaminophen (PERCOCET) 5-325 mg Oral Tablet Take 1 Tab by mouth Every 6 hours as needed for Pain, Disp-20 Tab, R-0, Print             Labs:    No results found for any visits on 06/20/14.    Radiology:  XR CHEST AP / PA & LATERAL  XR CHEST AP / PA & LATERAL    (Results Pending)   Imaging Studies: Imaging studies were ordered. Results contemporaneously interpreted by me:  XR CHEST AP / PA & LATERAL : NAD         ECG:  NONE      Procedure     Procedures    Course/Disposition/Plan     Course:  1803: Patient states symptoms have improved. He will be discharged at this time and  is to return to the emergency department as needed for any new or worsening symptoms.    Disposition:    Discharged    Follow up:   Milda Smart, DO  [address]  [place] [address]  [phone]    Call in 1 day        Clinical Impression:     Encounter Diagnosis   Name Primary?    Acute chest wall pain Yes       Future Appointments Scheduled in Epic:  Future Appointments  Date Time Provider Department Center   06/22/2014 1:10 PM Swalm, Cecilie Lowers, DO [facility] None       SCRIBE ATTESTATION   This note is prepared by Letitia Caul, acting as Scribe for Dr. Thurston Hole.    The scribe's documentation has been prepared under my direction and personally reviewed by me in its entirety.  I confirm that the note above accurately reflects all work, treatment, procedures, and medical decision making performed by me, Dr. Thurston Hole.    SCRIBE ATTESTATION   This note is prepared by Vikki Ports at [phone], acting as Scribe for Dr. Thurston Hole.    The scribe's documentation has been prepared under my direction and personally reviewed by me in its entirety.  I confirm that the note above accurately reflects all work, treatment, procedures, and medical decision making performed by me, Dr. Thurston Hole.

## 2014-06-20 NOTE — ED Nurses Note (Signed)
Patient medicated per provider orders. Patient tolerated well. Will continue to monitor patient for adverse effects. Patients bed in the lowest position with the call bell within reach.

## 2014-06-20 NOTE — ED Nurses Note (Signed)
Patient pain level reassessed post medication administration.Patient states his pain has diminished. Patient sitting up in talking to his wife bed in lowest position with the call bell within reach. Provider made aware.

## 2014-06-22 ENCOUNTER — Encounter (INDEPENDENT_AMBULATORY_CARE_PROVIDER_SITE_OTHER): Payer: Self-pay | Admitting: FAMILY MEDICINE

## 2014-06-22 ENCOUNTER — Ambulatory Visit (INDEPENDENT_AMBULATORY_CARE_PROVIDER_SITE_OTHER): Payer: BC Managed Care – PPO | Admitting: FAMILY MEDICINE

## 2014-06-22 VITALS — BP 124/80 | HR 75 | Temp 97.1°F | Resp 16 | Ht 71.5 in | Wt 238.0 lb

## 2014-06-22 DIAGNOSIS — Z6832 Body mass index (BMI) 32.0-32.9, adult: Secondary | ICD-10-CM

## 2014-06-22 DIAGNOSIS — Z954 Presence of other heart-valve replacement: Secondary | ICD-10-CM

## 2014-06-22 DIAGNOSIS — Z5181 Encounter for therapeutic drug level monitoring: Secondary | ICD-10-CM

## 2014-06-22 DIAGNOSIS — Z952 Presence of prosthetic heart valve: Secondary | ICD-10-CM

## 2014-06-22 LAB — POCT EAST PROTHROMBIN TIME (AMB)
INR: 4.1
PROTHROMBIN TIME: 37.4
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
## 2014-06-22 NOTE — Progress Notes (Signed)
06/22/14 1300   [facility] PT/INR   Date INR drawn 06/22/14   PT 37.4   INR 4.1   Initials SB

## 2014-06-22 NOTE — Progress Notes (Addendum)
SUBJECTIVE:  [name] is a 64 y.o. male here for anticoagulation appointment. Patient on chronic Coumadin therapy for [employer]. Jude valve. INR today 4.1. Currently taking Coumadin 41 mg per week, consisting of 3.75 mg 3 nights per week, and 7.5 mg 4 nights per week. Patient denies any blood in stool, blood in urine, easy bruising. Does report that he had a fall as week and had some bleeding of the right upper extremity, however this did stop with minimal pressure and bandaging. Denies any chest pain, shortness of breath, nausea, vomiting, diarrhea, blood in stool, blood in urine, fever, headache, cough. Other medical issues include:    Patient Active Problem List   Diagnosis    Mechanical heart valve present    H/O diverticulitis of colon    Epilepsy    Status post lobectomy of brain    Tension headache, chronic    Insomnia    Joint pain    Anticoagulation goal of INR 2 to 3       ROS: Negative other than HPI.    No changes in [place]    History   Smoking status    Current Every Day Smoker -- 1.00 packs/day    Last Attempt to Quit: 06/01/2013   Smokeless tobacco    Never Used       OBJECTIVE:  BP 124/80 mmHg   Pulse 75   Temp(Src) 36.2 C (97.1 F) (Oral)   Resp 16   Ht 1.816 m (5' 11.5")   Wt 107.956 kg (238 lb)   BMI 32.74 kg/m2  General: appears in good health  Lungs: thoracic excursion   normal  Cardiovascular:    Heart regular rate and rhythm, mechanical click  Abdomen: soft, non-tender and bowel sounds normal  Extremities: no cyanosis or edema and bandage on right forearm    ASSESSMENT/PLAN:      ICD-10-CM    1. Mechanical heart valve present Z95.4 POCT EAST PROTHROMBIN TIME (AMB)     We'll decrease Coumadin from 41 mg to 37 mg per week, consisting of 3.75 mg 4 nights per week, 7.5 mg 3 days per week, and will hold dose tonight. Followup and repeat INR 2 weeks.      Zachery Dakins Swalm, DO  06/23/2014, 19:59    I discussed the patient's care with the Resident prior to the patient leaving the clinic. Any  significant discussion points are noted .    Hoy Register, MD 06/30/2014, 10:47

## 2014-06-22 NOTE — Patient Instructions (Signed)
Take Coumadin 3.75 mg Monday Wednesday and Friday  Take Coumadin 7.5 mg every other day of the week  Do not take any tonight

## 2014-06-30 ENCOUNTER — Other Ambulatory Visit (INDEPENDENT_AMBULATORY_CARE_PROVIDER_SITE_OTHER): Payer: Self-pay | Admitting: Family Medicine

## 2014-07-04 ENCOUNTER — Other Ambulatory Visit (INDEPENDENT_AMBULATORY_CARE_PROVIDER_SITE_OTHER): Payer: Self-pay | Admitting: Family Medicine

## 2014-07-04 ENCOUNTER — Other Ambulatory Visit (INDEPENDENT_AMBULATORY_CARE_PROVIDER_SITE_OTHER): Payer: Self-pay

## 2014-07-04 ENCOUNTER — Encounter (INDEPENDENT_AMBULATORY_CARE_PROVIDER_SITE_OTHER): Payer: Self-pay

## 2014-07-04 NOTE — Telephone Encounter (Signed)
My chart message sent.    Desiree Hane, LPN  2/35/3614, 43:15

## 2014-07-06 ENCOUNTER — Other Ambulatory Visit (INDEPENDENT_AMBULATORY_CARE_PROVIDER_SITE_OTHER): Payer: Self-pay | Admitting: Family Medicine

## 2014-07-06 ENCOUNTER — Other Ambulatory Visit (INDEPENDENT_AMBULATORY_CARE_PROVIDER_SITE_OTHER): Payer: Self-pay

## 2014-07-06 ENCOUNTER — Ambulatory Visit (INDEPENDENT_AMBULATORY_CARE_PROVIDER_SITE_OTHER): Payer: BC Managed Care – PPO | Admitting: Pharmacist

## 2014-07-06 DIAGNOSIS — Z5181 Encounter for therapeutic drug level monitoring: Secondary | ICD-10-CM

## 2014-07-06 DIAGNOSIS — Z7901 Long term (current) use of anticoagulants: Secondary | ICD-10-CM

## 2014-07-06 LAB — POCT EAST PROTHROMBIN TIME (AMB)
INR: 3.4
PROTHROMBIN TIME: 32

## 2014-07-06 NOTE — Progress Notes (Addendum)
Subjective:     [name] is a 64 y.o. male here for warfarin management.      Indication for warfarin: aortic valve replacement    Goal: 2.0-3.0   Current dose: 7.5 mg mwf; 3.75 mg others  Duration:  Lifetime    Also complains of:      1.   Other medical issues include:  Patient Active Problem List   Diagnosis    Mechanical heart valve present    H/O diverticulitis of colon    Epilepsy    Status post lobectomy of brain    Tension headache, chronic    Insomnia    Joint pain    Anticoagulation goal of INR 2 to 3       Past Medical History   Diagnosis Date    Aortic valve replaced     Diverticulitis     Wears glasses     Abnormal EKG     Seizures     Depression     Anxiety     Rectal bleeding     Kidney stones     Viral hepatitis C     Bladder stone          Past Surgical History   Procedure Laterality Date    Hx knee replacment Left     Hx gall bladder surgery/chole      Hx aortic valve replacement      Hx colectomy  2010     at [facility] for diverticulitis.    Hx colonoscopy           History     Social History    Marital Status: Married     Spouse Name: Olin Hauser     Number of Children: 2    Years of Education: 12     Occupational History    disabled [employer]     Social History Main Topics    Smoking status: Current Every Day Smoker -- 1.00 packs/day     Last Attempt to Quit: 06/01/2013    Smokeless tobacco: Never Used    Alcohol Use: No    Drug Use: No    Sexual Activity:     Partners: Female     Other Topics Concern    Ability To Walk 1 Flight Of Steps Without Sob/Cp Yes    Ability To Walk 2 Flight Of Steps Without Sob/Cp Yes    Ability To Do Own Adl's Yes     Social History Narrative     Allergies   Allergen Reactions    Morphine  Other Adverse Reaction (Add comment)     combative     Medications reviewed    Since the last visit has the patient experienced any of the following:    1. Unusual bruising bleeding   No  2. Nosebleeds    No  3. Blood in urine or change in  color  No  4. Change in diet    No  (change intake of vitamin K rich foods)  5. Alcohol intake    No  6. Acute illness in past 10 days  No  (i.e. Fever, sore, throat, diarrhea)   7. Missed dose (per patient)   No  8. Falls or injuries    No  9. Symptoms of thromboembolic event No  10. Hospitalization/ER visit   No    Objective:     There were no vitals taken for this visit.     Labs: PT/INR = 32.0/3.4  Assessment/Plan:       1. Anticoagulation   Dose: 5 mg daily  Education:  none    Labs ordered:     Orders Placed This Encounter    POCT EAST PROTHROMBIN TIME (AMB)    cosigned by Dr. Carrie Mew    Current Outpatient Prescriptions   Medication Sig    escitalopram oxalate (LEXAPRO) 20 mg Oral Tablet Take 1 Tab (20 mg total) by mouth Once a day    LevETIRAcetam (KEPPRA) 1,000 mg Oral Tablet TAKE 1 TAB (1,000 MG TOTAL) BY MOUTH TWICE DAILY    OXcarbazepine (TRILEPTAL) 600 mg Oral Tablet Take 1 Tab (600 mg total) by mouth Twice daily    tamsulosin (FLOMAX) 0.4 mg Oral Capsule, Sust. Release 24 hr Take 1 Cap (0.4 mg total) by mouth Every evening after dinner    traZODone (DESYREL) 50 mg Oral Tablet TAKE 2 TABS (100 MG TOTAL) BY MOUTH EVERY NIGHT FOR 30 DAYS    traZODone (DESYREL) 50 mg Oral Tablet Take 2 Tabs (100 mg total) by mouth Every night as needed for Insomnia    warfarin (COUMADIN) 7.5 mg Oral Tablet Take 1 Tab (7.5 mg total) by mouth Once a day       follow up in 2 weeks

## 2014-07-13 ENCOUNTER — Other Ambulatory Visit (INDEPENDENT_AMBULATORY_CARE_PROVIDER_SITE_OTHER): Payer: Self-pay

## 2014-07-13 ENCOUNTER — Encounter (INDEPENDENT_AMBULATORY_CARE_PROVIDER_SITE_OTHER): Payer: BC Managed Care – PPO

## 2014-07-20 ENCOUNTER — Ambulatory Visit (INDEPENDENT_AMBULATORY_CARE_PROVIDER_SITE_OTHER): Payer: BC Managed Care – PPO | Admitting: Pharmacist

## 2014-07-20 ENCOUNTER — Other Ambulatory Visit (INDEPENDENT_AMBULATORY_CARE_PROVIDER_SITE_OTHER): Payer: Self-pay

## 2014-07-20 DIAGNOSIS — Z5181 Encounter for therapeutic drug level monitoring: Secondary | ICD-10-CM

## 2014-07-20 DIAGNOSIS — Z7901 Long term (current) use of anticoagulants: Secondary | ICD-10-CM

## 2014-07-20 LAB — POCT EAST PROTHROMBIN TIME (AMB)
INR: 1.6
PROTHROMBIN TIME: 15.6

## 2014-07-20 MED ORDER — WARFARIN 5 MG TABLET
7.50 mg | ORAL_TABLET | Freq: Every evening | ORAL | Status: DC
Start: 2014-07-20 — End: 2015-07-28

## 2014-07-20 NOTE — Progress Notes (Addendum)
Subjective:     [name] is a 64 y.o. male here for warfarin management.      Indication for warfarin: aortic valve    Goal: 2.0-3.0   Current dose: 5 mg daily  Duration:  Lifetime    Also complains of:      1.   Other medical issues include:  Patient Active Problem List   Diagnosis    Mechanical heart valve present    H/O diverticulitis of colon    Epilepsy    Status post lobectomy of brain    Tension headache, chronic    Insomnia    Joint pain    Anticoagulation goal of INR 2 to 3       Past Medical History   Diagnosis Date    Aortic valve replaced     Diverticulitis     Wears glasses     Abnormal EKG     Seizures     Depression     Anxiety     Rectal bleeding     Kidney stones     Viral hepatitis C     Bladder stone          Past Surgical History   Procedure Laterality Date    Hx knee replacment Left     Hx gall bladder surgery/chole      Hx aortic valve replacement      Hx colectomy  2010     at [facility] for diverticulitis.    Hx colonoscopy           History     Social History    Marital Status: Married     Spouse Name: Olin Hauser    Number of Children: 2    Years of Education: 12     Occupational History    disabled [employer]     Social History Main Topics    Smoking status: Current Every Day Smoker -- 1.00 packs/day     Last Attempt to Quit: 06/01/2013    Smokeless tobacco: Never Used    Alcohol Use: No    Drug Use: No    Sexual Activity:     Partners: Female     Other Topics Concern    Ability To Walk 1 Flight Of Steps Without Sob/Cp Yes    Ability To Walk 2 Flight Of Steps Without Sob/Cp Yes    Ability To Do Own Adl's Yes     Social History Narrative     Allergies   Allergen Reactions    Morphine  Other Adverse Reaction (Add comment)     combative     Medications reviewed    Since the last visit has the patient experienced any of the following:    1. Unusual bruising bleeding   No  2. Nosebleeds    No  3. Blood in urine or change in color  No  4. Change in  diet    No  (change intake of vitamin K rich foods)  5. Alcohol intake    No  6. Acute illness in past 10 days  No  (i.e. Fever, sore, throat, diarrhea)   7. Missed dose (per patient)   No  8. Falls or injuries    No  9. Symptoms of thromboembolic event No  10. Hospitalization/ER visit   No    Objective:     There were no vitals taken for this visit.     Labs: PT/INR = 16.2/1.5  Assessment/Plan:       1. Anticoagulation   Dose: 7.5 mg w; 5 mg others  Education:  none    Labs ordered:     Orders Placed This Encounter    POCT EAST PROTHROMBIN TIME (AMB)    cosigned by Dr. Bobby Rumpf    Current Outpatient Prescriptions   Medication Sig    escitalopram oxalate (LEXAPRO) 20 mg Oral Tablet Take 1 Tab (20 mg total) by mouth Once a day    LevETIRAcetam (KEPPRA) 1,000 mg Oral Tablet TAKE 1 TAB (1,000 MG TOTAL) BY MOUTH TWICE DAILY    OXcarbazepine (TRILEPTAL) 600 mg Oral Tablet Take 1 Tab (600 mg total) by mouth Twice daily    OXcarbazepine (TRILEPTAL) 600 mg Oral Tablet TAKE 1 TAB (600 MG TOTAL) BY MOUTH TWICE DAILY    OXcarbazepine (TRILEPTAL) 600 mg Oral Tablet Take 1 Tab (600 mg total) by mouth Twice daily    tamsulosin (FLOMAX) 0.4 mg Oral Capsule, Sust. Release 24 hr Take 1 Cap (0.4 mg total) by mouth Every evening after dinner    traZODone (DESYREL) 50 mg Oral Tablet TAKE 2 TABS (100 MG TOTAL) BY MOUTH EVERY NIGHT FOR 30 DAYS    traZODone (DESYREL) 50 mg Oral Tablet Take 2 Tabs (100 mg total) by mouth Every night as needed for Insomnia    warfarin (COUMADIN) 7.5 mg Oral Tablet Take 1 Tab (7.5 mg total) by mouth Once a day       follow up in 4-5 weeks; as he is traveling to [place]       HPI    Review of Systems     Physical Exam  Ortho Exam

## 2014-07-20 NOTE — Telephone Encounter (Signed)
Needs a refill on warafrin

## 2014-07-21 ENCOUNTER — Telehealth (INDEPENDENT_AMBULATORY_CARE_PROVIDER_SITE_OTHER): Payer: Self-pay

## 2014-07-21 NOTE — Telephone Encounter (Signed)
Pt left message stating he is going to [place] and his Keppra will run out before he gets back, it is too early for the pharmacy to refill. He is asking if there is a way he could get a Rx for a week. Please advise.    [facility]  Registration Specialist

## 2014-08-11 ENCOUNTER — Other Ambulatory Visit (INDEPENDENT_AMBULATORY_CARE_PROVIDER_SITE_OTHER): Payer: Self-pay

## 2014-08-13 ENCOUNTER — Other Ambulatory Visit (INDEPENDENT_AMBULATORY_CARE_PROVIDER_SITE_OTHER): Payer: Self-pay

## 2014-08-23 ENCOUNTER — Ambulatory Visit (INDEPENDENT_AMBULATORY_CARE_PROVIDER_SITE_OTHER): Payer: BC Managed Care – PPO | Admitting: Pharmacist

## 2014-08-23 DIAGNOSIS — Z5181 Encounter for therapeutic drug level monitoring: Secondary | ICD-10-CM

## 2014-08-23 DIAGNOSIS — Z7901 Long term (current) use of anticoagulants: Secondary | ICD-10-CM

## 2014-08-23 LAB — POCT EAST PROTHROMBIN TIME (AMB)
INR: 2.4
PROTHROMBIN TIME: 23.3

## 2014-08-23 NOTE — Progress Notes (Addendum)
Subjective:     [name] is a 64 y.o. male here for warfarin management.      Indication for warfarin: aortic valve    Goal: 2.0-3.0   Current dose: 7.5 mg w; 5 mg others  Duration:  Lifetime    Also complains of:      1.   Other medical issues include:  Patient Active Problem List   Diagnosis    Mechanical heart valve present    H/O diverticulitis of colon    Epilepsy    Status post lobectomy of brain    Tension headache, chronic    Insomnia    Joint pain    Anticoagulation goal of INR 2 to 3       Past Medical History   Diagnosis Date    Aortic valve replaced     Diverticulitis     Wears glasses     Abnormal EKG     Seizures     Depression     Anxiety     Rectal bleeding     Kidney stones     Viral hepatitis C     Bladder stone          Past Surgical History   Procedure Laterality Date    Hx knee replacment Left     Hx gall bladder surgery/chole      Hx aortic valve replacement      Hx colectomy  2010     at [facility] for diverticulitis.    Hx colonoscopy           History     Social History    Marital Status: Married     Spouse Name: Olin Hauser    Number of Children: 2    Years of Education: 12     Occupational History    disabled [employer]     Social History Main Topics    Smoking status: Current Every Day Smoker -- 1.00 packs/day     Last Attempt to Quit: 06/01/2013    Smokeless tobacco: Never Used    Alcohol Use: No    Drug Use: No    Sexual Activity:     Partners: Female     Other Topics Concern    Ability To Walk 1 Flight Of Steps Without Sob/Cp Yes    Ability To Walk 2 Flight Of Steps Without Sob/Cp Yes    Ability To Do Own Adl's Yes     Social History Narrative     Allergies   Allergen Reactions    Morphine  Other Adverse Reaction (Add comment)     combative     Medications reviewed    Since the last visit has the patient experienced any of the following:    1. Unusual bruising bleeding   No  2. Nosebleeds    No  3. Blood in urine or change in color  No  4. Change  in diet    No  (change intake of vitamin K rich foods)  5. Alcohol intake    No  6. Acute illness in past 10 days  No  (i.e. Fever, sore, throat, diarrhea)   7. Missed dose (per patient)   No  8. Falls or injuries    No  9. Symptoms of thromboembolic event No  10. Hospitalization/ER visit   No    Objective:     There were no vitals taken for this visit.     Labs: PT/INR = 23.3/2.4  Assessment/Plan:       1. Anticoagulation   Dose: Continue current dose  Education:  none    Labs ordered:     Orders Placed This Encounter    POCT EAST PROTHROMBIN TIME (AMB)    cosigned by Dr. Bobby Rumpf    Current Outpatient Prescriptions   Medication Sig    cyclobenzaprine (FLEXERIL) 5 mg Oral Tablet Take 1 Tab (5 mg total) by mouth Three times a day as needed for Muscle spasms    escitalopram oxalate (LEXAPRO) 20 mg Oral Tablet Take 1 Tab (20 mg total) by mouth Once a day    LevETIRAcetam (KEPPRA) 1,000 mg Oral Tablet TAKE 1 TAB (1,000 MG TOTAL) BY MOUTH TWICE DAILY    OXcarbazepine (TRILEPTAL) 600 mg Oral Tablet Take 600 mg by mouth One time    tamsulosin (FLOMAX) 0.4 mg Oral Capsule, Sust. Release 24 hr Take 1 Cap (0.4 mg total) by mouth Every evening after dinner    warfarin (COUMADIN) 5 mg Oral Tablet Take 1.5 Tabs (7.5 mg total) by mouth Every evening       follow up in 4 weeks

## 2014-08-24 ENCOUNTER — Encounter (INDEPENDENT_AMBULATORY_CARE_PROVIDER_SITE_OTHER): Payer: BC Managed Care – PPO | Admitting: Pharmacist

## 2014-09-22 ENCOUNTER — Ambulatory Visit (INDEPENDENT_AMBULATORY_CARE_PROVIDER_SITE_OTHER): Payer: BC Managed Care – PPO

## 2014-09-22 ENCOUNTER — Encounter (INDEPENDENT_AMBULATORY_CARE_PROVIDER_SITE_OTHER): Payer: Self-pay

## 2014-09-22 VITALS — BP 118/66 | HR 76 | Temp 97.6°F | Resp 16 | Ht 71.5 in | Wt 232.0 lb

## 2014-09-22 DIAGNOSIS — Z952 Presence of prosthetic heart valve: Secondary | ICD-10-CM

## 2014-09-22 DIAGNOSIS — Z6831 Body mass index (BMI) 31.0-31.9, adult: Secondary | ICD-10-CM

## 2014-09-22 DIAGNOSIS — Z954 Presence of other heart-valve replacement: Secondary | ICD-10-CM

## 2014-09-22 DIAGNOSIS — G4700 Insomnia, unspecified: Secondary | ICD-10-CM

## 2014-09-22 MED ORDER — ZOLPIDEM 10 MG TABLET
10.00 mg | ORAL_TABLET | Freq: Every evening | ORAL | Status: DC | PRN
Start: 2014-09-22 — End: 2015-03-30

## 2014-09-22 NOTE — Progress Notes (Signed)
[facility] FOLLOW-UP [facility]ISIT    SUBJECTIVE:  [name] is a 64 y.o. male with a chief complaint of   Chief Complaint   Patient presents with    Anticoagulation     INSOMNIA  Using Ambien 10 mg qhs which has been working well  He is taking wife's Ambien and she is taking his trazodone  Trazodone makes his mouth dry and he has to wake up to get a drink    MECHANICAL AORTIC VALVE  Has been pretty well controlled via Dr. Jens Som  No missed doses, taking qhs  No big changes in diet, rare broccoli  7.5 mg w; 5 mg others      EXAM:  BP 118/66 mmHg   Pulse 76   Temp(Src) 36.4 C (97.6 F) (Oral)   Resp 16   Ht 1.816 m (5' 11.5")   Wt 105.235 kg (232 lb)   BMI 31.91 kg/m2  GEN: A&O, in NAD  PSYCH: Appropriate mood and broad affect. Thoughts logical and goal oriented, well groomed and appropriately dressed. Moderate eye contact. Insight good.  CV: RRR, s1 and mechanical S2, no murmurs  LUNGS: CTAB no w/r/r    ASSESSMENT and PLAN:  INSOMNIA - continue Ambien 10 mg qhs  MECHANICAL AORTIC VALVE - continue current dosing 7.5 mg w; 5 mg others    [place] , DO 09/22/2014, 11:19        Visit Diagnoses and Associated Orders, Previous Medication List, Allergies -- Summary:        ICD-10-CM    1. Mechanical heart valve present Z95.4 POCT EAST PROTHROMBIN TIME (AMB)           Medications:  Current Outpatient Prescriptions   Medication Sig    cyclobenzaprine (FLEXERIL) 5 mg Oral Tablet Take 1 Tab (5 mg total) by mouth Three times a day as needed for Muscle spasms (Patient not taking: Reported on 09/22/2014)    escitalopram oxalate (LEXAPRO) 20 mg Oral Tablet Take 1 Tab (20 mg total) by mouth Once a day    LevETIRAcetam (KEPPRA) 1,000 mg Oral Tablet TAKE 1 TAB (1,000 MG TOTAL) BY MOUTH TWICE DAILY    OXcarbazepine (TRILEPTAL) 600 mg Oral Tablet Take 600 mg by mouth One time    tamsulosin (FLOMAX) 0.4 mg Oral Capsule, Sust. Release 24 hr Take 1 Cap (0.4 mg total) by mouth Every evening after dinner     warfarin (COUMADIN) 5 mg Oral Tablet Take 1.5 Tabs (7.5 mg total) by mouth Every evening        Allergies:  Allergies   Allergen Reactions    Morphine  Other Adverse Reaction (Add comment)     combative
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
## 2014-11-08 ENCOUNTER — Other Ambulatory Visit (INDEPENDENT_AMBULATORY_CARE_PROVIDER_SITE_OTHER): Payer: Self-pay

## 2014-11-23 ENCOUNTER — Encounter (INDEPENDENT_AMBULATORY_CARE_PROVIDER_SITE_OTHER): Payer: BC Managed Care – PPO

## 2014-12-12 ENCOUNTER — Encounter (INDEPENDENT_AMBULATORY_CARE_PROVIDER_SITE_OTHER): Payer: BC Managed Care – PPO

## 2014-12-22 ENCOUNTER — Other Ambulatory Visit (INDEPENDENT_AMBULATORY_CARE_PROVIDER_SITE_OTHER): Payer: Self-pay

## 2014-12-26 ENCOUNTER — Ambulatory Visit (INDEPENDENT_AMBULATORY_CARE_PROVIDER_SITE_OTHER): Payer: BC Managed Care – PPO

## 2014-12-26 ENCOUNTER — Other Ambulatory Visit (INDEPENDENT_AMBULATORY_CARE_PROVIDER_SITE_OTHER): Payer: Self-pay

## 2014-12-26 DIAGNOSIS — Z954 Presence of other heart-valve replacement: Secondary | ICD-10-CM

## 2014-12-26 DIAGNOSIS — Z952 Presence of prosthetic heart valve: Secondary | ICD-10-CM

## 2014-12-26 LAB — POCT EAST PROTHROMBIN TIME (AMB)
INR: 1.4
PROTHROMBIN TIME: 14.1

## 2014-12-26 NOTE — Progress Notes (Addendum)
Subjective:     [name] is a 64 y.o. male here for warfarin management.      Indication for warfarin: Aortic Heart Valve    Goal: 2.0-3.0   Current dose: 7.5 mg W, 5 mg all others  Duration:  Lifetime    Also complains of:      1.   Other medical issues include:  Patient Active Problem List   Diagnosis    Mechanical heart valve present    H/O diverticulitis of colon    Epilepsy    Status post lobectomy of brain    Tension headache, chronic    Insomnia    Joint pain    Anticoagulation goal of INR 2 to 3       Past Medical History   Diagnosis Date    Aortic valve replaced     Diverticulitis     Wears glasses     Abnormal EKG     Seizures     Depression     Anxiety     Rectal bleeding     Kidney stones     Viral hepatitis C     Bladder stone          Past Surgical History   Procedure Laterality Date    Hx knee replacment Left     Hx gall bladder surgery/chole      Hx aortic valve replacement      Hx colectomy  2010     at [facility] for diverticulitis.    Hx colonoscopy           Social History     Social History    Marital Status: Married     Spouse Name: Olin Hauser    Number of Children: 2    Years of Education: 12     Occupational History    disabled [employer]     Social History Main Topics    Smoking status: Current Every Day Smoker -- 1.00 packs/day     Last Attempt to Quit: 06/01/2013    Smokeless tobacco: Never Used    Alcohol Use: No    Drug Use: No    Sexual Activity:     Partners: Female     Other Topics Concern    Ability To Walk 1 Flight Of Steps Without Sob/Cp Yes    Ability To Walk 2 Flight Of Steps Without Sob/Cp Yes    Ability To Do Own Adl's Yes     Social History Narrative     Allergies   Allergen Reactions    Morphine  Other Adverse Reaction (Add comment)     combative     Medications reviewed    Since the last visit has the patient experienced any of the following:    1. Unusual bruising bleeding   No  2. Nosebleeds    No  3. Blood in urine or change in  color  No  4. Change in diet    No  (change intake of vitamin K rich foods)  5. Alcohol intake    No  6. Acute illness in past 10 days  No  (i.e. Fever, sore, throat, diarrhea)   7. Missed dose (per patient)   No  8. Falls or injuries    No  9. Symptoms of thromboembolic event No  10. Hospitalization/ER visit   No    Objective:     There were no vitals taken for this visit.     Labs: PT/INR =  14.1  1.4      Assessment/Plan:       1. Anticoagulation   Dose:  7.5 mg MWF, 5 mg all others  Education:  none    Labs ordered:    Orders Placed This Encounter    POCT EAST PROTHROMBIN TIME (AMB)    cosigned by Dr. Bobby Rumpf    Current Outpatient Prescriptions   Medication Sig    cyclobenzaprine (FLEXERIL) 5 mg Oral Tablet Take 1 Tab (5 mg total) by mouth Three times a day as needed for Muscle spasms (Patient not taking: Reported on 09/22/2014)    escitalopram oxalate (LEXAPRO) 20 mg Oral Tablet Take 1 Tab (20 mg total) by mouth Once a day    LevETIRAcetam (KEPPRA) 1,000 mg Oral Tablet Take 1 Tab (1,000 mg total) by mouth Twice daily    OXcarbazepine (TRILEPTAL) 600 mg Oral Tablet Take 600 mg by mouth One time    tamsulosin (FLOMAX) 0.4 mg Oral Capsule, Sust. Release 24 hr Take 1 Cap (0.4 mg total) by mouth Every evening after dinner    traZODone (DESYREL) 50 mg Oral Tablet TAKE 2 TABS BY MOUTH EVERY NIGHT AS NEEDED FOR INSOMNIA    warfarin (COUMADIN) 5 mg Oral Tablet Take 1.5 Tabs (7.5 mg total) by mouth Every evening    Zolpidem (AMBIEN) 10 mg Oral Tablet Take 1 Tab (10 mg total) by mouth Every night as needed for Insomnia       follow up in 1 week.          Encounter Date: 12/26/2014    I discussed management with the resident/fellow. I reviewed the resident's/fellow's note and agree with the documented findings and plan of care except as noted below:      Alvin Critchley, MD

## 2015-01-02 ENCOUNTER — Ambulatory Visit (INDEPENDENT_AMBULATORY_CARE_PROVIDER_SITE_OTHER): Payer: BC Managed Care – PPO

## 2015-01-02 DIAGNOSIS — Z720 Tobacco use: Secondary | ICD-10-CM

## 2015-01-02 DIAGNOSIS — Z954 Presence of other heart-valve replacement: Secondary | ICD-10-CM

## 2015-01-02 DIAGNOSIS — Z952 Presence of prosthetic heart valve: Secondary | ICD-10-CM

## 2015-01-02 LAB — POCT EAST PROTHROMBIN TIME (AMB)
INR: 2.4
PROTHROMBIN TIME: 22.7

## 2015-01-02 NOTE — Progress Notes (Addendum)
Subjective:     [name] is a 64 y.o. male here for warfarin management.      Indication for warfarin: mechanical heart valve (aortic)    Goal: 2.0-3.0  Current dose: 7.5 mwf, 5 mg all others  Duration:  Lifetime    Also complains of:      1.   Other medical issues include:  Patient Active Problem List   Diagnosis    Mechanical heart valve present    H/O diverticulitis of colon    Epilepsy    Status post lobectomy of brain    Tension headache, chronic    Insomnia    Joint pain    Anticoagulation goal of INR 2 to 3       Past Medical History   Diagnosis Date    Aortic valve replaced     Diverticulitis     Wears glasses     Abnormal EKG     Seizures     Depression     Anxiety     Rectal bleeding     Kidney stones     Viral hepatitis C     Bladder stone          Past Surgical History   Procedure Laterality Date    Hx knee replacment Left     Hx gall bladder surgery/chole      Hx aortic valve replacement      Hx colectomy  2010     at [facility] for diverticulitis.    Hx colonoscopy           Social History     Social History    Marital Status: Married     Spouse Name: Olin Hauser    Number of Children: 2    Years of Education: 12     Occupational History    disabled [employer]     Social History Main Topics    Smoking status: Current Every Day Smoker -- 1.00 packs/day     Last Attempt to Quit: 06/01/2013    Smokeless tobacco: Never Used    Alcohol Use: No    Drug Use: No    Sexual Activity:     Partners: Female     Other Topics Concern    Ability To Walk 1 Flight Of Steps Without Sob/Cp Yes    Ability To Walk 2 Flight Of Steps Without Sob/Cp Yes    Ability To Do Own Adl's Yes     Social History Narrative     Allergies   Allergen Reactions    Morphine  Other Adverse Reaction (Add comment)     combative     Medications reviewed    Since the last visit has the patient experienced any of the following:    1. Unusual bruising bleeding   No  2. Nosebleeds    No  3. Blood in urine or  change in color  No  4. Change in diet    No  (change intake of vitamin K rich foods)  5. Alcohol intake    No  6. Acute illness in past 10 days  No  (i.e. Fever, sore, throat, diarrhea)   7. Missed dose (per patient)   No  8. Falls or injuries    No  9. Symptoms of thromboembolic event No  10. Hospitalization/ER visit   No    Objective:     There were no vitals taken for this visit.     Labs: PT/INR = 22.7  2.4      Assessment/Plan:       1. Anticoagulation   Dose:  Continue current dose  Education:  none    Labs ordered:     Orders Placed This Encounter    POCT EAST PROTHROMBIN TIME (AMB)    cosigned by Dr. Bobby Rumpf    Current Outpatient Prescriptions   Medication Sig    cyclobenzaprine (FLEXERIL) 5 mg Oral Tablet Take 1 Tab (5 mg total) by mouth Three times a day as needed for Muscle spasms (Patient not taking: Reported on 09/22/2014)    escitalopram oxalate (LEXAPRO) 20 mg Oral Tablet Take 1 Tab (20 mg total) by mouth Once a day    LevETIRAcetam (KEPPRA) 1,000 mg Oral Tablet Take 1 Tab (1,000 mg total) by mouth Twice daily    OXcarbazepine (TRILEPTAL) 600 mg Oral Tablet Take 600 mg by mouth One time    tamsulosin (FLOMAX) 0.4 mg Oral Capsule, Sust. Release 24 hr Take 1 Cap (0.4 mg total) by mouth Every evening after dinner    traZODone (DESYREL) 50 mg Oral Tablet TAKE 2 TABS BY MOUTH EVERY NIGHT AS NEEDED FOR INSOMNIA    warfarin (COUMADIN) 5 mg Oral Tablet Take 1.5 Tabs (7.5 mg total) by mouth Every evening    warfarin (COUMADIN) 7.5 mg Oral Tablet Take 1 Tab (7.5 mg total) by mouth Once a day    Zolpidem (AMBIEN) 10 mg Oral Tablet Take 1 Tab (10 mg total) by mouth Every night as needed for Insomnia       follow up in 3 weeks          Encounter Date: 01/02/2015    I discussed management with the resident/fellow. I reviewed the resident's/fellow's note and agree with the documented findings and plan of care except as noted below:      Alvin Critchley, MD

## 2015-01-05 ENCOUNTER — Other Ambulatory Visit (INDEPENDENT_AMBULATORY_CARE_PROVIDER_SITE_OTHER): Payer: Self-pay

## 2015-01-23 ENCOUNTER — Emergency Department (EMERGENCY_DEPARTMENT_HOSPITAL): Payer: BC Managed Care – PPO

## 2015-01-23 ENCOUNTER — Emergency Department
Admission: EM | Admit: 2015-01-23 | Discharge: 2015-01-23 | Disposition: A | Discharge status: Home / Self Care | Payer: BC Managed Care – PPO | Attending: Emergency Medicine | Admitting: Emergency Medicine

## 2015-01-23 DIAGNOSIS — S2341XA Sprain of ribs, initial encounter: Principal | ICD-10-CM | POA: Insufficient documentation

## 2015-01-23 DIAGNOSIS — Z952 Presence of prosthetic heart valve: Secondary | ICD-10-CM | POA: Insufficient documentation

## 2015-01-23 DIAGNOSIS — Z87442 Personal history of urinary calculi: Secondary | ICD-10-CM | POA: Insufficient documentation

## 2015-01-23 DIAGNOSIS — Z7901 Long term (current) use of anticoagulants: Secondary | ICD-10-CM | POA: Insufficient documentation

## 2015-01-23 DIAGNOSIS — R0781 Pleurodynia: Secondary | ICD-10-CM

## 2015-01-23 DIAGNOSIS — F419 Anxiety disorder, unspecified: Secondary | ICD-10-CM | POA: Insufficient documentation

## 2015-01-23 DIAGNOSIS — F17210 Nicotine dependence, cigarettes, uncomplicated: Secondary | ICD-10-CM | POA: Insufficient documentation

## 2015-01-23 DIAGNOSIS — Z9049 Acquired absence of other specified parts of digestive tract: Secondary | ICD-10-CM | POA: Insufficient documentation

## 2015-01-23 MED ORDER — DIAZEPAM 5 MG TABLET
5.00 mg | ORAL_TABLET | Freq: Four times a day (QID) | ORAL | Status: DC | PRN
Start: 2015-01-23 — End: 2015-03-30

## 2015-01-23 MED ORDER — OXYCODONE-ACETAMINOPHEN 5 MG-325 MG TABLET
1.00 | ORAL_TABLET | Freq: Four times a day (QID) | ORAL | Status: DC | PRN
Start: 2015-01-23 — End: 2015-03-30

## 2015-01-23 MED ORDER — OXYCODONE-ACETAMINOPHEN 5 MG-325 MG TABLET
1.0000 | ORAL_TABLET | Freq: Once | ORAL | Status: AC
Start: 2015-01-23 — End: 2015-01-23
  Administered 2015-01-23: 1 via ORAL
  Filled 2015-01-23: qty 1

## 2015-01-23 NOTE — ED Nurses Note (Signed)
Pt remains resting quietly with spouse at bedside. No needs voiced. Call bell remains in reach.

## 2015-01-23 NOTE — ED Nurses Note (Signed)
+   abd pain started 2 days ago.  Right upper and lower side of abd and radiates to back.  States he did not have any injury to area.  Patient denies nausea vomiting and diarrhea.  Unable to sleep.  States he was attempting to start lawn equipment just prior to this happening.

## 2015-01-23 NOTE — ED Nurses Note (Signed)
Patient provided with written discharge info. AVS reviewed with patient/care giver. Patient home with written copy of AVS, discharge instructions, education and rx 2 . Questions answered as needed and patient/care giver encouraged to follow up with PCP as indicated. In the event of emergency, patient/care giver to call 911 or go to nearest ED.    Pt home with wife, reports improved pain at time of d/c. Ambulatory out of dept with NAD noted.

## 2015-01-23 NOTE — ED Nurses Note (Signed)
Dr Christa See at bedside. Assuming primary care of pt at this time. Pt reports muscle strain to bil ribs, guarding right rib area-onset Saturday.

## 2015-01-23 NOTE — ED Nurses Note (Signed)
Dr Parikh leaving the bedside.

## 2015-01-23 NOTE — ED Provider Notes (Signed)
[facility]  Emergency Department     HISTORY OF PRESENT ILLNESS     Date:  01/23/2015  Patient's Name:  [name]  Date of Birth:  1951-01-30    Patient is a 64 y.o. male presenting with musculoskeletal pain.   History provided by:  Patient  Musculoskeletal Pain  Location:  Right lower ribs  Severity:  Mild  Onset quality:  Sudden  Timing:  Constant  Progression:  Unchanged  Chronicity:  New  Associated symptoms: no abdominal pain, no congestion, no cough, no diarrhea, no fever, no headaches, no nausea, no rhinorrhea, no shortness of breath, no sore throat and no vomiting       64 y/o pt presenting into the ED with c/o right lower rib pain x 2 days. Pain onset after pulling the string of lawn equipment multiple times. Tried wife's norco with some relief. No chest pain, SOB, or fevers.     Review of Systems     Review of Systems   Constitutional: Negative for fever and chills.   HENT: Negative for congestion, rhinorrhea and sore throat.    Respiratory: Negative for cough and shortness of breath.    Gastrointestinal: Negative for nausea, vomiting, abdominal pain and diarrhea.   Neurological: Negative for headaches.   All other systems reviewed and are negative.      Previous History     Past Medical History:  Past Medical History   Diagnosis Date    Aortic valve replaced     Diverticulitis     Wears glasses     Abnormal EKG     Seizures     Depression     Anxiety     Rectal bleeding     Kidney stones     Viral hepatitis C     Bladder stone        Past Surgical History:  Past Surgical History   Procedure Laterality Date    Hx knee replacment Left     Hx gall bladder surgery/chole      Hx aortic valve replacement      Hx colectomy  2010    Hx colonoscopy         Social History:  Social History   Substance Use Topics    Smoking status: Current Every Day Smoker -- 1.00 packs/day     Last Attempt to Quit: 06/01/2013    Smokeless tobacco: Never Used    Alcohol Use:  No     History   Drug Use No       Family History:  Family History   Problem Relation Age of Onset    COPD Mother     Heart Surgery Father      CABG    Prostate Cancer Father 24    Heart Disease Father        Medication History:  Current Outpatient Prescriptions   Medication Sig    cyclobenzaprine (FLEXERIL) 5 mg Oral Tablet Take 1 Tab (5 mg total) by mouth Three times a day as needed for Muscle spasms (Patient not taking: Reported on 09/22/2014)    diazePAM (VALIUM) 5 mg Oral Tablet Take 1 Tab (5 mg total) by mouth Every 6 hours as needed for Anxiety    escitalopram oxalate (LEXAPRO) 20 mg Oral Tablet Take 1 Tab (20 mg total) by mouth Once a day    LevETIRAcetam (KEPPRA) 1,000 mg Oral Tablet Take 1 Tab (1,000 mg total) by mouth Twice daily  OXcarbazepine (TRILEPTAL) 600 mg Oral Tablet Take 600 mg by mouth One time    oxyCODONE-acetaminophen (PERCOCET) 5-325 mg Oral Tablet Take 1 Tab by mouth Every 6 hours as needed for Pain    tamsulosin (FLOMAX) 0.4 mg Oral Capsule, Sust. Release 24 hr Take 1 Cap (0.4 mg total) by mouth Every evening after dinner    traZODone (DESYREL) 50 mg Oral Tablet TAKE 2 TABS BY MOUTH EVERY NIGHT AS NEEDED FOR INSOMNIA    warfarin (COUMADIN) 5 mg Oral Tablet Take 1.5 Tabs (7.5 mg total) by mouth Every evening    warfarin (COUMADIN) 7.5 mg Oral Tablet Take 1 Tab (7.5 mg total) by mouth Once a day    Zolpidem (AMBIEN) 10 mg Oral Tablet Take 1 Tab (10 mg total) by mouth Every night as needed for Insomnia       Allergies:  Allergies   Allergen Reactions    Morphine  Other Adverse Reaction (Add comment)     combative       Physical Exam     Vitals:    BP 128/73 mmHg   Pulse 95   Temp(Src) 36.8 C (98.3 F)   Resp 16   Ht 1.829 m (6')   Wt 113.399 kg (250 lb)   BMI 33.90 kg/m2   SpO2 97%    Physical Exam   Nursing note and vitals reviewed.    Constitutional:  Well developed, well nourished.  Awake & alert. No distress.  Head:  Atraumatic.  Normocephalic.    Eyes:  PERRL.  EOMI.   Conjunctivae are not pale.  ENT:  Mucous membranes are moist and intact.  Oropharynx is clear and symmetric.  Patent airway.  Neck:  Supple.  Full ROM.  No JVD.  No lymphadenopathy.  Cardiovascular:  Regular rate.  Regular rhythm.  No murmurs, rubs, or gallops.  Distal pulses are 2+ and symmetric.  Pulmonary/Chest:  No evidence of respiratory distress.  Clear to auscultation bilaterally.  No wheezing, rales or rhonchi. Chest non-tender. Right lower rib pain, reproducible.   Abdominal:  Soft and non-distended.  There is no tenderness.  No rebound, guarding, or rigidity.  No organomegaly.  Good bowel sounds.    Back:  No CVA tenderness. FROM.   Extremities:  No edema.   No cyanosis.  No clubbing.  Full range of motion in all extremities.  No calf tenderness.  Skin:  Skin is warm and dry.  No diaphoresis. No rash.   Neurological:  Alert, awake, and appropriate.  Normal speech.  Sensation normal. Motor strengths 5/5. CN II-XII intact.   Psychiatric:  Good eye contact.  Normal interaction, affect, and behavior.    Diagnostic Studies/Treatment     Medications:  Medications   oxyCODONE-acetaminophen (PERCOCET) 5-325mg  per tablet (1 Tab Oral Given 01/23/15 0753)       New Prescriptions    DIAZEPAM (VALIUM) 5 MG ORAL TABLET    Take 1 Tab (5 mg total) by mouth Every 6 hours as needed for Anxiety    OXYCODONE-ACETAMINOPHEN (PERCOCET) 5-325 MG ORAL TABLET    Take 1 Tab by mouth Every 6 hours as needed for Pain       Labs:    No results found for any visits on 01/23/15.    Radiology:  XR RIBS RIGHT W/ PA CHEST    XR RIBS RIGHT W/ PA CHEST    (Results Pending)   Imaging Studies: Imaging studies were ordered. Results contemporaneously interpreted by me:  XR RIBS RIGHT W/  PA CHEST: No fracture or dislocation. No pneumothorax.       ECG:  NONE      Procedure     Procedures    Course/Disposition/Plan     Course:    Improved after meds, will dc home with Percocet/Valium    Disposition:    Discharged    Condition at Disposition:      Stable      Follow up:   [name], DO  [address]  [place] [address]  [phone]    Schedule an appointment as soon as possible for a visit  As needed      Clinical Impression:     Encounter Diagnosis   Name Primary?    Rib sprain, initial encounter Yes       Future Appointments Scheduled in Epic:  Future Appointments  Date Time Provider [place]   01/30/2015 9:00 AM [place], Seth Bake, [facility] None     SCRIBE ATTESTATION   This note is prepared by Kerby Nora, acting as Scribe for Dr. Christa See  The scribe's documentation has been prepared under my direction and personally reviewed by me in its entirety.  I confirm that the note above accurately reflects all work, treatment, procedures, and medical decision making performed by me, Dr. Christa See

## 2015-01-23 NOTE — ED Nurses Note (Signed)
Patient medicated per provider's orders. Radiology completed. Pt resting with spouse at bedside. Awaiting re-evaluation.

## 2015-01-26 ENCOUNTER — Other Ambulatory Visit (INDEPENDENT_AMBULATORY_CARE_PROVIDER_SITE_OTHER): Payer: Self-pay

## 2015-01-27 NOTE — Telephone Encounter (Signed)
Very high risk medication for this patient - with increased liklihood of death when valium used in conjunction with opiates, Ambien, trazodone, flexeril.  Will not fill.  Needs appointment. Can use flexeril meanwhile.  [name], DO  01/27/2015, 16:41

## 2015-01-30 ENCOUNTER — Encounter (INDEPENDENT_AMBULATORY_CARE_PROVIDER_SITE_OTHER): Payer: Self-pay

## 2015-01-30 ENCOUNTER — Ambulatory Visit (INDEPENDENT_AMBULATORY_CARE_PROVIDER_SITE_OTHER): Payer: BC Managed Care – PPO

## 2015-01-30 DIAGNOSIS — Z720 Tobacco use: Secondary | ICD-10-CM

## 2015-01-30 DIAGNOSIS — Z952 Presence of prosthetic heart valve: Secondary | ICD-10-CM

## 2015-01-30 LAB — POCT EAST PROTHROMBIN TIME (AMB)
INR: 1.2
PROTHROMBIN TIME: 12.6

## 2015-01-30 NOTE — Progress Notes (Signed)
Subjective:     [name] is a 64 y.o. male here for warfarin management.      Indication for warfarin: mechanical heart valve    Goal: 2.0-3.0   Current dose: 75. Mg WMF, 5 mg all other days  Duration:  Lifetime    Also complains of:      1.   Other medical issues include:  Patient Active Problem List   Diagnosis    Mechanical heart valve present    H/O diverticulitis of colon    Epilepsy ([place])    Status post lobectomy of brain    Tension headache, chronic    Insomnia    Joint pain    Anticoagulation goal of INR 2 to 3       Past Medical History   Diagnosis Date    Aortic valve replaced     Diverticulitis     Wears glasses     Abnormal EKG     Seizures     Depression     Anxiety     Rectal bleeding     Kidney stones     Viral hepatitis C     Bladder stone          Past Surgical History   Procedure Laterality Date    Hx knee replacment Left     Hx gall bladder surgery/chole      Hx aortic valve replacement      Hx colectomy  2010     at [facility] for diverticulitis.    Hx colonoscopy           Social History     Social History    Marital Status: Married     Spouse Name: Olin Hauser    Number of Children: 2    Years of Education: 12     Occupational History    disabled [place]     Social History Main Topics    Smoking status: Current Every Day Smoker -- 1.00 packs/day     Last Attempt to Quit: 06/01/2013    Smokeless tobacco: Never Used    Alcohol Use: No    Drug Use: No    Sexual Activity:     Partners: Female     Other Topics Concern    Ability To Walk 1 Flight Of Steps Without Sob/Cp Yes    Ability To Walk 2 Flight Of Steps Without Sob/Cp Yes    Ability To Do Own Adl's Yes     Social History Narrative     Allergies   Allergen Reactions    Morphine  Other Adverse Reaction (Add comment)     combative     Medications reviewed    Since the last visit has the patient experienced any of the following:    1. Unusual bruising bleeding   No  2. Nosebleeds    No  3. Blood in urine  or change in color  No  4. Change in diet    No  (change intake of vitamin K rich foods)  5. Alcohol intake    No  6. Acute illness in past 10 days  Yes  (i.e. Fever, sore, throat, diarrhea)   7. Missed dose (per patient)   No  8. Falls or injuries    Yes  9. Symptoms of thromboembolic event No  10. Hospitalization/ER visit   Yes - hurt ribs trying to start lawn motor    Objective:     There were no vitals taken  for this visit.     Labs: PT/INR = 12.6/1.2        Assessment/Plan:       1. Anticoagulation   Dose:  Continue current dose  Education:  Take medication when ill    Labs ordered: PT/INR    Orders Placed This Encounter    POCT EAST PROTHROMBIN TIME (AMB)    cosigned by Dr. Bobby Rumpf    Current Outpatient Prescriptions   Medication Sig    cyclobenzaprine (FLEXERIL) 5 mg Oral Tablet Take 1 Tab (5 mg total) by mouth Three times a day as needed for Muscle spasms (Patient not taking: Reported on 09/22/2014)    diazePAM (VALIUM) 5 mg Oral Tablet Take 1 Tab (5 mg total) by mouth Every 6 hours as needed for Anxiety    escitalopram oxalate (LEXAPRO) 20 mg Oral Tablet Take 1 Tab (20 mg total) by mouth Once a day    LevETIRAcetam (KEPPRA) 1,000 mg Oral Tablet Take 1 Tab (1,000 mg total) by mouth Twice daily    OXcarbazepine (TRILEPTAL) 600 mg Oral Tablet Take 600 mg by mouth One time    oxyCODONE-acetaminophen (PERCOCET) 5-325 mg Oral Tablet Take 1 Tab by mouth Every 6 hours as needed for Pain    tamsulosin (FLOMAX) 0.4 mg Oral Capsule, Sust. Release 24 hr Take 1 Cap (0.4 mg total) by mouth Every evening after dinner    traZODone (DESYREL) 50 mg Oral Tablet TAKE 2 TABS BY MOUTH EVERY NIGHT AS NEEDED FOR INSOMNIA    warfarin (COUMADIN) 5 mg Oral Tablet Take 1.5 Tabs (7.5 mg total) by mouth Every evening    warfarin (COUMADIN) 7.5 mg Oral Tablet Take 1 Tab (7.5 mg total) by mouth Once a day    Zolpidem (AMBIEN) 10 mg Oral Tablet Take 1 Tab (10 mg total) by mouth Every night as needed for Insomnia       follow up in  1 week

## 2015-01-30 NOTE — Telephone Encounter (Signed)
Informed via my chart message

## 2015-02-05 ENCOUNTER — Other Ambulatory Visit (INDEPENDENT_AMBULATORY_CARE_PROVIDER_SITE_OTHER): Payer: Self-pay

## 2015-02-06 ENCOUNTER — Ambulatory Visit (INDEPENDENT_AMBULATORY_CARE_PROVIDER_SITE_OTHER): Payer: BC Managed Care – PPO

## 2015-02-06 DIAGNOSIS — Z952 Presence of prosthetic heart valve: Secondary | ICD-10-CM

## 2015-02-06 LAB — POCT EAST PROTHROMBIN TIME (AMB)
INR: 2.5
PROTHROMBIN TIME: 23.5

## 2015-02-06 NOTE — Progress Notes (Signed)
Subjective:     [name] is a 64 y.o. male here for warfarin management.      Indication for warfarin: mechanical heart valve    Goal: 2.0-3.0   Current dose: 7.5 MWF, 5 mg all other days  Duration:  Lifetime    Also complains of:      1.   Other medical issues include:  Patient Active Problem List   Diagnosis    Mechanical heart valve present    H/O diverticulitis of colon    Epilepsy ([place])    Status post lobectomy of brain    Tension headache, chronic    Insomnia    Joint pain    Anticoagulation goal of INR 2 to 3       Past Medical History   Diagnosis Date    Aortic valve replaced     Diverticulitis     Wears glasses     Abnormal EKG     Seizures     Depression     Anxiety     Rectal bleeding     Kidney stones     Viral hepatitis C     Bladder stone          Past Surgical History   Procedure Laterality Date    Hx knee replacment Left     Hx gall bladder surgery/chole      Hx aortic valve replacement      Hx colectomy  2010     at [facility] for diverticulitis.    Hx colonoscopy           Social History     Social History    Marital Status: Married     Spouse Name: Olin Hauser    Number of Children: 2    Years of Education: 12     Occupational History    disabled [place]     Social History Main Topics    Smoking status: Current Every Day Smoker -- 1.00 packs/day     Last Attempt to Quit: 06/01/2013    Smokeless tobacco: Never Used    Alcohol Use: No    Drug Use: No    Sexual Activity:     Partners: Female     Other Topics Concern    Ability To Walk 1 Flight Of Steps Without Sob/Cp Yes    Ability To Walk 2 Flight Of Steps Without Sob/Cp Yes    Ability To Do Own Adl's Yes     Social History Narrative     Allergies   Allergen Reactions    Morphine  Other Adverse Reaction (Add comment)     combative     Medications reviewed    Since the last visit has the patient experienced any of the following:    1. Unusual bruising bleeding     2.   3. Nosebleeds    No  4. Blood in urine  or change in color  No  5. Change in diet    No  (change intake of vitamin K rich foods)  6. Alcohol intake    No  7. Acute illness in past 10 days  No  (i.e. Fever, sore, throat, diarrhea)   8. Missed dose (per patient)   No  9. Falls or injuries    No  10. Symptoms of thromboembolic event No  11. Hospitalization/ER visit   No    Objective:     There were no vitals taken for this visit.  Labs: PT/INR = 23.5/2.5        Assessment/Plan:       1. Anticoagulation   Dose:  Continue current dose  Education:  none    Labs ordered: PT/INR    Orders Placed This Encounter    POCT EAST PROTHROMBIN TIME (AMB)    cosigned by Dr. Bobby Rumpf    Current Outpatient Prescriptions   Medication Sig    cyclobenzaprine (FLEXERIL) 5 mg Oral Tablet Take 1 Tab (5 mg total) by mouth Three times a day as needed for Muscle spasms (Patient not taking: Reported on 09/22/2014)    diazePAM (VALIUM) 5 mg Oral Tablet Take 1 Tab (5 mg total) by mouth Every 6 hours as needed for Anxiety    escitalopram oxalate (LEXAPRO) 20 mg Oral Tablet Take 1 Tab (20 mg total) by mouth Once a day    LevETIRAcetam (KEPPRA) 1,000 mg Oral Tablet Take 1 Tab (1,000 mg total) by mouth Twice daily    OXcarbazepine (TRILEPTAL) 600 mg Oral Tablet Take 600 mg by mouth One time    oxyCODONE-acetaminophen (PERCOCET) 5-325 mg Oral Tablet Take 1 Tab by mouth Every 6 hours as needed for Pain    tamsulosin (FLOMAX) 0.4 mg Oral Capsule, Sust. Release 24 hr Take 1 Cap (0.4 mg total) by mouth Every evening after dinner    traZODone (DESYREL) 50 mg Oral Tablet TAKE 2 TABS BY MOUTH EVERY NIGHT AS NEEDED FOR INSOMNIA    warfarin (COUMADIN) 5 mg Oral Tablet Take 1.5 Tabs (7.5 mg total) by mouth Every evening    warfarin (COUMADIN) 7.5 mg Oral Tablet Take 1 Tab (7.5 mg total) by mouth Once a day    Zolpidem (AMBIEN) 10 mg Oral Tablet Take 1 Tab (10 mg total) by mouth Every night as needed for Insomnia       follow up in 4 weeks

## 2015-02-18 ENCOUNTER — Other Ambulatory Visit: Payer: Self-pay

## 2015-02-20 ENCOUNTER — Encounter (INDEPENDENT_AMBULATORY_CARE_PROVIDER_SITE_OTHER): Payer: Self-pay | Admitting: Family Medicine

## 2015-02-20 ENCOUNTER — Ambulatory Visit (INDEPENDENT_AMBULATORY_CARE_PROVIDER_SITE_OTHER): Payer: BC Managed Care – PPO | Admitting: Family Medicine

## 2015-02-20 VITALS — BP 122/80 | HR 88 | Temp 97.3°F | Resp 18 | Ht 73.0 in | Wt 246.4 lb

## 2015-02-20 DIAGNOSIS — Z720 Tobacco use: Secondary | ICD-10-CM

## 2015-02-20 DIAGNOSIS — F513 Sleepwalking [somnambulism]: Secondary | ICD-10-CM

## 2015-02-20 DIAGNOSIS — Z6832 Body mass index (BMI) 32.0-32.9, adult: Secondary | ICD-10-CM
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
## 2015-02-20 NOTE — Progress Notes (Signed)
[facility]  Acute Care Visit    ID: Hershal Coria   DOB: [birth year]  Date of Service: 02/20/2015     Chief Complaint(s):   Chief Complaint   Patient presents with    Sleep Problem       SUBJECTIVE:  [name] is a 64 y.o. male here with concern for sleep walking. He says this started a few weeks and he would get up at 2 am and make some coffee. He says sometimes he will remember some activities but most of the time he is not aware and will not remember anything. He just wakes to find a pot of coffee sitting there. Most recently however he apparently went to the kitchen, placed a frying pan on the oven and turned on the heat. He placed a package of cheese still in the plastic onto the frying pan.     He has been taking Ambien since June due to issues with falling asleep. Has also been on trazodone in the past but has stopped that since starting the Ambien. Has extensive neurologic history with seizure disorder. Taking multiple medications including Keppra, Valium and Trileptal. He is also at increased bleeding risk due to his anticoagulation with Coumadin.     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       Epilepsy ([place]) 11/03/2012     On trileptal and Keppra. Being followed in [place].       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 Aortic valve replacement done in 2003 for bicuspid valve. INR goal 2.0-3.0, new recs for mech valve.         Medications:  Current Outpatient Prescriptions   Medication Sig    cyclobenzaprine (FLEXERIL) 5 mg Oral Tablet Take 1 Tab (5 mg total) by mouth Three times a day as needed for Muscle spasms (Patient not taking: Reported on 09/22/2014)     diazePAM (VALIUM) 5 mg Oral Tablet Take 1 Tab (5 mg total) by mouth Every 6 hours as needed for Anxiety (Patient not taking: Reported on 02/20/2015)    escitalopram oxalate (LEXAPRO) 20 mg Oral Tablet Take 1 Tab (20 mg total) by mouth Once a day    LevETIRAcetam (KEPPRA) 1,000 mg Oral Tablet Take 1 Tab (1,000 mg total) by mouth Twice daily    OXcarbazepine (TRILEPTAL) 600 mg Oral Tablet Take 600 mg by mouth One time    oxyCODONE-acetaminophen (PERCOCET) 5-325 mg Oral Tablet Take 1 Tab by mouth Every 6 hours as needed for Pain (Patient not taking: Reported on 02/20/2015)    tamsulosin (FLOMAX) 0.4 mg Oral Capsule, Sust. Release 24 hr Take 1 Cap (0.4 mg total) by mouth Every evening after dinner    traZODone (DESYREL) 50 mg Oral Tablet Take 2 Tabs (100 mg total) by mouth Every night as needed for Insomnia (Patient not taking: Reported on 02/20/2015)    warfarin (COUMADIN) 5 mg Oral Tablet Take 1.5 Tabs (7.5 mg total) by mouth Every evening    warfarin (COUMADIN) 7.5 mg Oral Tablet Take 1 Tab (7.5 mg total) by mouth Once a day  Zolpidem (AMBIEN) 10 mg Oral Tablet Take 1 Tab (10 mg total) by mouth Every night as needed for Insomnia        Allergies:  Allergies   Allergen Reactions    Morphine  Other Adverse Reaction (Add comment)     combative        Social History:  Social History   Substance Use Topics    Smoking status: Current Every Day Smoker -- 1.00 packs/day     Last Attempt to Quit: 06/01/2013    Smokeless tobacco: Never Used    Alcohol Use: No          OBJECTIVE:  BP 122/80 mmHg   Pulse 88   Temp(Src) 36.3 C (97.3 F) (Oral)   Resp 18   Ht 1.854 m (6\' 1" )   Wt 111.766 kg (246 lb 6.4 oz)   BMI 32.52 kg/m2     Physical Exam:  General: Patient is well nourished, well developed and well groomed and in no acute distress.   HEENT: Notable ecchymoses around the left eye.   Skin: Multiple bruises of the left arm and side.    Neuro: CNs II-XII intact. No focal deficits.     Labs:  None.       ASSESSMENT and  PLAN:  1. Non-rapid eye movement sleep arousal disorder, sleep walking type    Recommend stopping Ambien as this may be contributing to his sleep arousal disorder.    Otherwise, would suggest he take protective measures such as locking doors, hanging bells to alert himself and wife that he is walking around at night.    Would reserve benzodiazepine (clonazepam) therapy if these conservative measures fail.         Medication changes: Advised to stop Ambien  Orders this visit: None  Refills this visit: None    ROV as needed. Follow-up with PCP.   Level III visit.     Roxanna Mew, MD 02/20/2015, 09:27

## 2015-03-03 ENCOUNTER — Other Ambulatory Visit (INDEPENDENT_AMBULATORY_CARE_PROVIDER_SITE_OTHER): Payer: Self-pay

## 2015-03-07 ENCOUNTER — Ambulatory Visit (INDEPENDENT_AMBULATORY_CARE_PROVIDER_SITE_OTHER): Payer: BC Managed Care – PPO | Admitting: Pharmacist

## 2015-03-07 DIAGNOSIS — Z720 Tobacco use: Secondary | ICD-10-CM

## 2015-03-07 DIAGNOSIS — Z5181 Encounter for therapeutic drug level monitoring: Secondary | ICD-10-CM

## 2015-03-07 DIAGNOSIS — Z7901 Long term (current) use of anticoagulants: Secondary | ICD-10-CM

## 2015-03-07 LAB — POCT EAST PROTHROMBIN TIME (AMB)
INR: 1.9
PROTHROMBIN TIME: 23.6

## 2015-03-07 NOTE — Progress Notes (Addendum)
Subjective:     [name] is a 64 y.o. male here for warfarin management.      Indication for warfarin: afib    Goal: 2.0-3.0   Current dose: 7.5 mg mwf; 5 mg others  Duration:  Lifetime    Also complains of:      1.   Other medical issues include:  Patient Active Problem List   Diagnosis    Mechanical heart valve present    H/O diverticulitis of colon    Epilepsy ([place])    Status post lobectomy of brain    Tension headache, chronic    Insomnia    Joint pain    Anticoagulation goal of INR 2 to 3       Past Medical History   Diagnosis Date    Aortic valve replaced     Diverticulitis     Wears glasses     Abnormal EKG     Seizures     Depression     Anxiety     Rectal bleeding     Kidney stones     Viral hepatitis C     Bladder stone          Past Surgical History   Procedure Laterality Date    Hx knee replacment Left     Hx gall bladder surgery/chole      Hx aortic valve replacement      Hx colectomy  2010     at [facility] for diverticulitis.    Hx colonoscopy           Social History     Social History    Marital Status: Married     Spouse Name: Olin Hauser    Number of Children: 2    Years of Education: 12     Occupational History    disabled [employer]     Social History Main Topics    Smoking status: Current Every Day Smoker -- 1.00 packs/day     Last Attempt to Quit: 06/01/2013    Smokeless tobacco: Never Used    Alcohol Use: No    Drug Use: No    Sexual Activity:     Partners: Female     Other Topics Concern    Ability To Walk 1 Flight Of Steps Without Sob/Cp Yes    Ability To Walk 2 Flight Of Steps Without Sob/Cp Yes    Ability To Do Own Adl's Yes     Social History Narrative     Allergies   Allergen Reactions    Morphine  Other Adverse Reaction (Add comment)     combative     Medications reviewed    Since the last visit has the patient experienced any of the following:    1. Unusual bruising bleeding   No  2. Nosebleeds    No  3. Blood in urine or change in  color  No  4. Change in diet    No  (change intake of vitamin K rich foods)  5. Alcohol intake    No  6. Acute illness in past 10 days  No  (i.e. Fever, sore, throat, diarrhea)   7. Missed dose (per patient)   No  8. Falls or injuries    No  9. Symptoms of thromboembolic event No  10. Hospitalization/ER visit   No    Objective:     There were no vitals taken for this visit.     Labs: PT/INR = 23.6/1.9  Assessment/Plan:       1. Anticoagulation   Dose: Continue current dose  Education:  none    Labs ordered:     Orders Placed This Encounter    POCT EAST PROTHROMBIN TIME (AMB)    cosigned by Dr. Rosana Berger    Current Outpatient Prescriptions   Medication Sig    cyclobenzaprine (FLEXERIL) 5 mg Oral Tablet Take 1 Tab (5 mg total) by mouth Three times a day as needed for Muscle spasms    diazePAM (VALIUM) 5 mg Oral Tablet Take 1 Tab (5 mg total) by mouth Every 6 hours as needed for Anxiety (Patient not taking: Reported on 02/20/2015)    escitalopram oxalate (LEXAPRO) 20 mg Oral Tablet Take 1 Tab (20 mg total) by mouth Once a day    LevETIRAcetam (KEPPRA) 1,000 mg Oral Tablet Take 1 Tab (1,000 mg total) by mouth Twice daily    OXcarbazepine (TRILEPTAL) 600 mg Oral Tablet Take 600 mg by mouth One time    oxyCODONE-acetaminophen (PERCOCET) 5-325 mg Oral Tablet Take 1 Tab by mouth Every 6 hours as needed for Pain (Patient not taking: Reported on 02/20/2015)    tamsulosin (FLOMAX) 0.4 mg Oral Capsule, Sust. Release 24 hr Take 1 Cap (0.4 mg total) by mouth Every evening after dinner    traZODone (DESYREL) 50 mg Oral Tablet Take 2 Tabs (100 mg total) by mouth Every night as needed for Insomnia (Patient not taking: Reported on 02/20/2015)    warfarin (COUMADIN) 5 mg Oral Tablet Take 1.5 Tabs (7.5 mg total) by mouth Every evening    warfarin (COUMADIN) 7.5 mg Oral Tablet Take 1 Tab (7.5 mg total) by mouth Once a day    Zolpidem (AMBIEN) 10 mg Oral Tablet Take 1 Tab (10 mg total) by mouth Every night as needed for  Insomnia       follow up in 6 weeks       Agree with assessment and plan as outlined  Finis Bud, MD

## 2015-03-30 ENCOUNTER — Ambulatory Visit: Payer: BC Managed Care – PPO

## 2015-03-30 ENCOUNTER — Ambulatory Visit (INDEPENDENT_AMBULATORY_CARE_PROVIDER_SITE_OTHER): Payer: BC Managed Care – PPO

## 2015-03-30 ENCOUNTER — Encounter (INDEPENDENT_AMBULATORY_CARE_PROVIDER_SITE_OTHER): Payer: Self-pay

## 2015-03-30 VITALS — BP 150/82 | HR 78 | Temp 97.8°F | Resp 16 | Ht 71.1 in | Wt 244.9 lb

## 2015-03-30 DIAGNOSIS — G40909 Epilepsy, unspecified, not intractable, without status epilepticus: Secondary | ICD-10-CM

## 2015-03-30 DIAGNOSIS — Z9089 Acquired absence of other organs: Secondary | ICD-10-CM

## 2015-03-30 DIAGNOSIS — Z6834 Body mass index (BMI) 34.0-34.9, adult: Secondary | ICD-10-CM

## 2015-03-30 DIAGNOSIS — F329 Major depressive disorder, single episode, unspecified: Secondary | ICD-10-CM

## 2015-03-30 DIAGNOSIS — F32A Depression, unspecified: Secondary | ICD-10-CM

## 2015-03-30 NOTE — Patient Instructions (Signed)
SLEEP - It does appear these is some depression here.  Lexapro is maxed out so let's wait on adding another depression medication.  Instead let's check your Keppra and Trileptal blood levels to make sure they are not too high.  Also DECREASE your trazodone to 50 mg only at bedtime as needed.  You should check in with a neurologist - Dr. Darliss Cheney put in a referral today.

## 2015-03-30 NOTE — Progress Notes (Signed)
[facility]   PROBLEM-ORIENTED VISIT    SUBJECTIVE:  [name] is a 64 y.o. male with a chief complaint of   Chief Complaint   Patient presents with    Sleeping Problem    Depression     DEPRESSION  Gaining weight  Sleeping for 16 hrs at a time - sometimes just to lay down for a nap and this happens  Feels it has started with a lseep  Sleepwalking around late october - seen by Dr. Carrie [name] 02/20/15 - had an episode of turning on the oven with a package of cheese on it  Stopped Ambien and started Trazodone 100 mg qhs again (which he had been on prior to June).  Did try just 1 tablet but it didn't do enough. Even though he felt some residual sleepiness the next day.   Wife and daughter both agree he is depressed too  Hobbies - just sits in the house and "does whatever is on my wife's list".  Last month has "been ugly outside and now it's cold" so has not wanted to go out.  But did go down to see his granddaughter and daughter [name], [place])   Takes flexeril maybe once a month  LExapro since his surgery.  Keppra and trileptal have been life long medications  If does not take trazodone then will be unable to fall asleep after 30-45 min.  But sometimes can fall asleep within a 1/2 hr.      Last visit to neurologist 2 yrs ago - Dr. Nevada [name] in [place]. No seizures since prior to the lobectomy. Has continued on Keppra and Trileptal.    SOC HX - no EtOH at all currently. No illicits.  EXAM:    Visit Vitals    BP (!) 150/82 (Cuff Location: Antecubital, Patient Position: Sitting, Cuff Size: Adult)    Pulse 78    Temp 36.6 C (97.8 F) (Oral)    Resp 16    Ht 1.806 m (5' 11.1")    Wt 111.1 kg (244 lb 14.4 oz)    BMI 34.06 kg/m2     GEN: A&O, in NAD  PSYCH: depressed mood and congruent affect. Thoughts logical and goal oriented, poorly groomed (unshaven which is the first time I have seen him this way) but appropriately dressed. Moderate eye contact. Insight fair.  CV: RRR, s1 and s2, no  murmurs  LUNGS: CTAB no w/r/r        ASSESSMENT and PLAN:  INSOMNIA, DEPRESSION - It does appear these is some depression here.  Lexapro is maxed out so let's wait on adding another depression medication.  Instead let's check your Keppra and Trileptal blood levels to make sure they are not too high.  Also DECREASE your trazodone to 50 mg only at bedtime as needed.  You should check in with a neurologist - Dr. Darliss [name] put in a referral today.   Question whether you could stop the Trileptal or Keppra.     Ruben Askew, DO 03/30/2015, 15:23        Visit Diagnoses and Associated Orders, Previous Medication List, Allergies -- Summary:      No diagnosis found.        Medications:  Current Outpatient Prescriptions   Medication Sig    cyclobenzaprine (FLEXERIL) 5 mg Oral Tablet Take 1 Tab (5 mg total) by mouth Three times a day as needed for Muscle spasms    diazePAM (VALIUM) 5 mg Oral Tablet Take 1 Tab (5  mg total) by mouth Every 6 hours as needed for Anxiety (Patient not taking: Reported on 02/20/2015)    escitalopram oxalate (LEXAPRO) 20 mg Oral Tablet Take 1 Tab (20 mg total) by mouth Once a day    LevETIRAcetam (KEPPRA) 1,000 mg Oral Tablet Take 1 Tab (1,000 mg total) by mouth Twice daily    OXcarbazepine (TRILEPTAL) 600 mg Oral Tablet Take 600 mg by mouth One time    oxyCODONE-acetaminophen (PERCOCET) 5-325 mg Oral Tablet Take 1 Tab by mouth Every 6 hours as needed for Pain (Patient not taking: Reported on 02/20/2015)    tamsulosin (FLOMAX) 0.4 mg Oral Capsule, Sust. Release 24 hr Take 1 Cap (0.4 mg total) by mouth Every evening after dinner    traZODone (DESYREL) 50 mg Oral Tablet Take 2 Tabs (100 mg total) by mouth Every night as needed for Insomnia (Patient not taking: Reported on 02/20/2015)    warfarin (COUMADIN) 5 mg Oral Tablet Take 1.5 Tabs (7.5 mg total) by mouth Every evening    warfarin (COUMADIN) 7.5 mg Oral Tablet Take 1 Tab (7.5 mg total) by mouth Once a day    Zolpidem (AMBIEN) 10 mg Oral  Tablet Take 1 Tab (10 mg total) by mouth Every night as needed for Insomnia        Allergies:  Allergies   Allergen Reactions    Morphine  Other Adverse Reaction (Add comment)     combative
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
## 2015-03-31 ENCOUNTER — Other Ambulatory Visit (INDEPENDENT_AMBULATORY_CARE_PROVIDER_SITE_OTHER): Payer: Self-pay

## 2015-04-01 LAB — OXCARBAZEPINE METABOLITE (MHC), SERUM: OXCARBAZEPINE METABOLITE (MHC), SERUM: 17 ug/mL (ref 3–35)

## 2015-04-03 LAB — LEVETIRACETAM, SERUM: LEVETIRACETAM,SERUM: 24.1 ug/mL (ref 12.0–46.0)

## 2015-04-10 NOTE — Progress Notes (Signed)
I have reviewed chart and agree with assessment and plan as described  Finis Bud, MD

## 2015-04-18 ENCOUNTER — Encounter (INDEPENDENT_AMBULATORY_CARE_PROVIDER_SITE_OTHER): Payer: BC Managed Care – PPO | Admitting: Pharmacist

## 2015-04-27 NOTE — Progress Notes (Signed)
Patient left clinic without being seen by provider.  Ruben Besson M Evamae Rowen, DO

## 2015-05-05 ENCOUNTER — Encounter (INDEPENDENT_AMBULATORY_CARE_PROVIDER_SITE_OTHER): Payer: Self-pay

## 2015-05-05 ENCOUNTER — Ambulatory Visit (INDEPENDENT_AMBULATORY_CARE_PROVIDER_SITE_OTHER): Payer: BC Managed Care – PPO

## 2015-05-05 VITALS — BP 118/78 | HR 65 | Temp 97.6°F | Resp 16 | Ht 73.0 in | Wt 243.0 lb

## 2015-05-05 DIAGNOSIS — G4733 Obstructive sleep apnea (adult) (pediatric): Secondary | ICD-10-CM | POA: Insufficient documentation

## 2015-05-05 DIAGNOSIS — Z5181 Encounter for therapeutic drug level monitoring: Secondary | ICD-10-CM

## 2015-05-05 DIAGNOSIS — Z6832 Body mass index (BMI) 32.0-32.9, adult: Secondary | ICD-10-CM

## 2015-05-05 DIAGNOSIS — Z9089 Acquired absence of other organs: Secondary | ICD-10-CM

## 2015-05-05 DIAGNOSIS — Z952 Presence of prosthetic heart valve: Secondary | ICD-10-CM

## 2015-05-05 DIAGNOSIS — Z7901 Long term (current) use of anticoagulants: Principal | ICD-10-CM

## 2015-05-05 LAB — POCT EAST PROTHROMBIN TIME (AMB)
INR: 2.7
PROTHROMBIN TIME: 32.5
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
## 2015-05-05 NOTE — Progress Notes (Signed)
[facility]   PROBLEM-ORIENTED VISIT    SUBJECTIVE:  [name] is a 65 y.o. male with a chief complaint of   Chief Complaint   Patient presents with    Depression     DEPRESSION  Sleep is very very poor still - up at 0200, 0400, 0500  Still taking 100 mg trazodone qhs  It used to be "20 min and I was out" - since the brain surgery it has been difficult to sleep  Overall states his depression is better.    They are challenged with his wife's father being dx with ESRD and has chosen not to do dialysis  Was called by neurologist and was told cancellation - so can get an appointment 05/14/14. Dr. Francee Nodal.   Taking keppa 1,000 BID.  Taking trileptal 600 mg daily (had been on BID prior to 6 mo ago) - recent level was therapeutic  Denies EtOH at all.   Wife is an alcoholic.  He does have some stress trying to reassure and comfort his wife as she deals with the stress of her father's health issues.   Had a sleep study while in the hospital prior to the surgery - at [facility].    Lays down to sleep at 10pm and takes 100 mg trazodone. If still not sleeping after 1 hour will take another 100 mg of trazodone.  Then will wake up every 2 hours sometimes but   Then usually sleeps solidly from 4am - 8-10am.  Sometimes sleeps as late as 2pm.  Does feel tired when he lays down.  Sometimes naps during the day.  Usually does house and yard chores during the day.  States no significant daytime somnolence.     MECHANICAL VALVE  Taking 7.5 mg 4x per week (Sun, Tues, Thurs, Sat)  5 mg three days per week (M, W, F).  Did have some missed doses - usually once per week.   If he misses a dose, then will take the 7.5mg  tablet instead of the 5 mg tablet the next day.    INR 2.7    EXAM:    Visit Vitals    BP 118/78    Pulse 65    Temp 36.4 C (97.6 F) (Oral)    Resp 16    Ht 1.854 m (6\' 1" )    Wt 110.2 kg (243 lb)    BMI 32.06 kg/m2     GEN: A&O, in NAD  PSYCH: Appropriate mood and broad affect. Thoughts logical and goal  oriented, moderately groomed (fuzzy but clean and trimmed beard and fuzzy clean hair) and appropriately dressed. Moderate eye contact. Insight good.  CV: RRR, s1 and s2, no murmurs  LUNGS: CTAB no w/r/r.  S2 click.     ASSESSMENT and PLAN:  INSOMNIA - ok to take a 3rd trazodone (150mg ) when you lay down to sleep.  Max would be to take a second dose of 50-100 mg in the early morning.  Repeat sleep study.  See if Dr. Coralyn Pear has anything to recommend.    SEIZURE DISORDER - see if Dr. Francee Nodal has recommendations about whether you need to continue the Trileptal and Keppra or not.     COUMADIN - continue current dosing 7.5mg  on Sunday, Tues, Thursday, Saturday and 5 mg on M, W, F.    DEPRESSION - trazodone as above.  Consider starting a walking routine, or using your weights at home. Something to get you up and moving on a daily  basis.    48min spent in patient encounter with >50% spent counseling on depression  [name], DO 05/05/2015, 11:51        Visit Diagnoses and Associated Orders, Previous Medication List, Allergies -- Summary:        ICD-10-CM    1. Anticoagulation goal of INR 2 to 3 Z51.81 POCT EAST PROTHROMBIN TIME (AMB)    Z79.01    2. Mechanical heart valve present Z95.2 POCT EAST PROTHROMBIN TIME (AMB)           Medications:  Current Outpatient Prescriptions   Medication Sig    cyclobenzaprine (FLEXERIL) 5 mg Oral Tablet Take 1 Tab (5 mg total) by mouth Three times a day as needed for Muscle spasms    escitalopram oxalate (LEXAPRO) 20 mg Oral Tablet Take 1 Tab (20 mg total) by mouth Once a day    LevETIRAcetam (KEPPRA) 1,000 mg Oral Tablet Take 1 Tab (1,000 mg total) by mouth Twice daily    OXcarbazepine (TRILEPTAL) 600 mg Oral Tablet Take 600 mg by mouth One time    tamsulosin (FLOMAX) 0.4 mg Oral Capsule, Sust. Release 24 hr Take 1 Cap (0.4 mg total) by mouth Every evening after dinner    traZODone (DESYREL) 50 mg Oral Tablet Take 2 Tabs (100 mg total) by mouth Every night as needed for  Insomnia (Patient not taking: Reported on 02/20/2015)    warfarin (COUMADIN) 5 mg Oral Tablet Take 1.5 Tabs (7.5 mg total) by mouth Every evening    warfarin (COUMADIN) 7.5 mg Oral Tablet Take 1 Tab (7.5 mg total) by mouth Once a day        Allergies:  Allergies   Allergen Reactions    Morphine  Other Adverse Reaction (Add comment)     combative

## 2015-05-05 NOTE — Patient Instructions (Addendum)
INSOMNIA - ok to take a 3rd trazodone (150mg ) when you lay down to sleep.  Max would be to take a second dose of 50-100 mg in the early morning.  Repeat sleep study.  See if Dr. Coralyn Pear has anything to recommend.    SEIZURE DISORDER - see if Dr. Francee Nodal has recommendations about whether you need to continue the Trileptal and Keppra or not.     COUMADIN - continue current dosing 7.5mg  on Sunday, Tues, Thursday, Saturday and 5 mg on M, W, F.    DEPRESSION - trazodone as above.  Consider starting a walking routine, or using your weights at home. Something to get you up and moving on a daily basis.

## 2015-05-05 NOTE — Progress Notes (Signed)
05/05/15 1100   [facility] PT/INR   Date INR drawn 05/05/15   PT 32.5   INR 2.7   Initials hmb   Abdulkarim Eberlin, LPN  [id number], [id number]

## 2015-05-11 ENCOUNTER — Other Ambulatory Visit (INDEPENDENT_AMBULATORY_CARE_PROVIDER_SITE_OTHER): Payer: Self-pay

## 2015-06-09 ENCOUNTER — Other Ambulatory Visit (INDEPENDENT_AMBULATORY_CARE_PROVIDER_SITE_OTHER): Payer: Self-pay

## 2015-06-30 ENCOUNTER — Ambulatory Visit: Payer: Medicare Other

## 2015-06-30 DIAGNOSIS — G40109 Localization-related (focal) (partial) symptomatic epilepsy and epileptic syndromes with simple partial seizures, not intractable, without status epilepticus: Secondary | ICD-10-CM

## 2015-06-30 DIAGNOSIS — R569 Unspecified convulsions: Secondary | ICD-10-CM | POA: Insufficient documentation

## 2015-06-30 DIAGNOSIS — G40909 Epilepsy, unspecified, not intractable, without status epilepticus: Secondary | ICD-10-CM | POA: Insufficient documentation

## 2015-06-30 LAB — CBC WITH DIFF
BASOPHIL #: 0.2 x10ˆ3/uL — ABNORMAL HIGH (ref 0.00–0.10)
BASOPHIL %: 3 % (ref 0–3)
EOSINOPHIL #: 0.1 x10ˆ3/uL (ref 0.00–0.50)
EOSINOPHIL %: 1 % (ref 0–5)
HCT: 48.4 % (ref 40.0–50.0)
HGB: 15.9 g/dL (ref 13.5–18.0)
LYMPHOCYTE #: 1.5 x10ˆ3/uL (ref 1.00–4.80)
LYMPHOCYTE %: 16 % (ref 15–43)
MCH: 29.7 pg (ref 27.5–33.2)
MCHC: 32.8 g/dL (ref 32.0–36.0)
MCV: 90.4 fL (ref 82.0–97.0)
MONOCYTE #: 0.7 x10ˆ3/uL (ref 0.20–0.90)
MONOCYTE %: 8 % (ref 5–12)
MPV: 10 fL (ref 7.4–10.5)
NEUTROPHIL #: 7 x10ˆ3/uL — ABNORMAL HIGH (ref 1.50–6.50)
NEUTROPHIL %: 73 % (ref 43–76)
PLATELETS: 192 x10ˆ3/uL (ref 150–450)
RBC: 5.35 x10ˆ6/uL (ref 4.40–5.80)
RDW: 14.8 % (ref 11.0–16.0)
RDW: 14.8 % (ref 11.0–16.0)
WBC: 9.6 x10ˆ3/uL (ref 4.0–11.0)

## 2015-06-30 LAB — COMPREHENSIVE METABOLIC PROFILE - BMC/JMC ONLY
ALBUMIN/GLOBULIN RATIO: 0.9 (ref 0.8–2.0)
ALBUMIN: 3.7 g/dL (ref 3.5–5.0)
ALKALINE PHOSPHATASE: 137 U/L — ABNORMAL HIGH (ref 38–126)
ALT (SGPT): 49 U/L (ref 17–63)
ANION GAP: 9 mmol/L (ref 3–11)
AST (SGOT): 67 U/L — ABNORMAL HIGH (ref 15–41)
BILIRUBIN TOTAL: 1.3 mg/dL — ABNORMAL HIGH (ref 0.3–1.2)
BUN/CREA RATIO: 23 — ABNORMAL HIGH (ref 6–22)
BUN: 25 mg/dL — ABNORMAL HIGH (ref 6–20)
CALCIUM: 9.4 mg/dL (ref 8.8–10.2)
CHLORIDE: 98 mmol/L — ABNORMAL LOW (ref 101–111)
CO2 TOTAL: 27 mmol/L (ref 22–32)
CREATININE: 1.07 mg/dL (ref 0.61–1.24)
ESTIMATED GFR: >60 mL/min/1.73mˆ2 (ref >60)
GLUCOSE: 147 mg/dL — ABNORMAL HIGH (ref 70–110)
POTASSIUM: 4.5 mmol/L (ref 3.4–5.1)
PROTEIN TOTAL: 7.6 g/dL (ref 6.4–8.3)
SODIUM: 134 mmol/L — ABNORMAL LOW (ref 136–145)

## 2015-06-30 LAB — THYROID STIMULATING HORMONE WITH FREE T4 REFLEX: TSH: 1.021 u[IU]/mL (ref 0.340–5.600)

## 2015-06-30 LAB — BILIRUBIN, CONJUGATED (DIRECT)
BILIRUBIN DIRECT: 0.4 mg/dL (ref 0.1–0.5)
BILIRUBIN DIRECT: 0.4 mg/dL (ref 0.1–0.5)

## 2015-06-30 LAB — SCAN DIFFERENTIAL
PLATELET ESTIMATE: ADEQUATE
RBC MORPHOLOGY COMMENT: NORMAL
WBC MORPHOLOGY COMMENT: NORMAL

## 2015-06-30 LAB — MAGNESIUM: MAGNESIUM: 1.9 mg/dL (ref 1.4–2.1)

## 2015-07-03 ENCOUNTER — Other Ambulatory Visit (HOSPITAL_BASED_OUTPATIENT_CLINIC_OR_DEPARTMENT_OTHER): Payer: Self-pay

## 2015-07-03 DIAGNOSIS — R569 Unspecified convulsions: Secondary | ICD-10-CM

## 2015-07-07 ENCOUNTER — Ambulatory Visit (HOSPITAL_BASED_OUTPATIENT_CLINIC_OR_DEPARTMENT_OTHER)
Admission: RE | Admit: 2015-07-07 | Discharge: 2015-07-07 | Disposition: A | Discharge status: Home / Self Care | Payer: Medicare Other | Source: Ambulatory Visit

## 2015-07-07 DIAGNOSIS — R569 Unspecified convulsions: Secondary | ICD-10-CM | POA: Insufficient documentation

## 2015-07-07 MED ORDER — GADOTERIDOL 279.3 MG/ML INTRAVENOUS SOLUTION
20.00 mL | INTRAVENOUS | Status: DC
Start: 2015-07-07 — End: 2015-07-08

## 2015-07-07 MED ORDER — GADOTERIDOL 279.3 MG/ML INTRAVENOUS SOLUTION
INTRAVENOUS | Status: AC
Start: 2015-07-07 — End: 2015-07-07
  Administered 2015-07-07: 20 mL
  Filled 2015-07-07: qty 20

## 2015-07-21 ENCOUNTER — Encounter (INDEPENDENT_AMBULATORY_CARE_PROVIDER_SITE_OTHER): Payer: Medicare Other

## 2015-07-24 ENCOUNTER — Encounter (INDEPENDENT_AMBULATORY_CARE_PROVIDER_SITE_OTHER): Payer: Medicare Other

## 2015-07-28 ENCOUNTER — Other Ambulatory Visit (INDEPENDENT_AMBULATORY_CARE_PROVIDER_SITE_OTHER): Payer: Self-pay

## 2015-07-28 ENCOUNTER — Encounter (INDEPENDENT_AMBULATORY_CARE_PROVIDER_SITE_OTHER): Payer: Self-pay

## 2015-07-28 ENCOUNTER — Ambulatory Visit (INDEPENDENT_AMBULATORY_CARE_PROVIDER_SITE_OTHER): Payer: Medicare Other

## 2015-07-28 VITALS — BP 129/72 | HR 70 | Temp 98.0°F | Resp 16 | Ht 72.0 in | Wt 248.0 lb

## 2015-07-28 DIAGNOSIS — R748 Abnormal levels of other serum enzymes: Secondary | ICD-10-CM | POA: Insufficient documentation

## 2015-07-28 DIAGNOSIS — M1990 Unspecified osteoarthritis, unspecified site: Secondary | ICD-10-CM | POA: Insufficient documentation

## 2015-07-28 DIAGNOSIS — Z952 Presence of prosthetic heart valve: Principal | ICD-10-CM

## 2015-07-28 DIAGNOSIS — E8881 Metabolic syndrome: Secondary | ICD-10-CM

## 2015-07-28 LAB — POCT EAST PROTHROMBIN TIME (AMB)
INR: 1.8
PROTHROMBIN TIME: 22.1

## 2015-07-28 MED ORDER — WARFARIN 5 MG TABLET
5.0000 mg | ORAL_TABLET | ORAL | 3 refills | Status: DC
Start: 2015-07-28 — End: 2015-11-15

## 2015-07-28 MED ORDER — WARFARIN 7.5 MG TABLET
7.5000 mg | ORAL_TABLET | ORAL | 2 refills | Status: DC
Start: 2015-07-29 — End: 2015-09-07
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
## 2015-07-28 NOTE — Progress Notes (Signed)
07/28/15 1100   [facility] PT/INR   Date INR drawn 07/28/15   PT 22.1   INR 1.8   Initials sb

## 2015-07-28 NOTE — Progress Notes (Signed)
[facility]   PROBLEM-ORIENTED VISIT    SUBJECTIVE:  [name] is a 65 y.o. male with a chief complaint of   Chief Complaint   Patient presents with    Anticoagulation    Heart Valve Replacement   MECHANICAL VALVE  Ran out of 5 mg tablets 2 weeks ago   Had some old 10 mg tablets - was breaking these in half but ran out  Then started breaking his 7.42m in 1/2 and taking those instead of the 599m INR 1.8    PAIN  Knees b/l hurt going up and down stairs  Hurt back moving a ladder and sleeps on right side with right arm elevated and that causes shoulder pain.   Cannot take aleve.  Tylenol 1,00038m AM not enough.  elevated liver enzymes AST and alk phos  Denies any EtOH currently  Does have h/o elevated BG - which could mean fatty liver dz    C-scope > 10 yrs ago - h/o diverticulitis - no polyps that were biposied  Had seen Dr. 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 Thoughts logical and goal oriented, well groomed and appropriately dressed. Moderate eye contact. Insight good  CV: RRR, s1 and s2 with click, I/VI SEM at LUSB. No peripheral edema.  LUNGS: CTAB no w/r/r  MSK: enlargement of b/l knee joints noted, without overlying erythema or pitting edmea.      ASSESSMENT and PLAN:  MECHANICAL VALVE - Rx for Coumadin 5 mg and 7.[facility]mg31mnt [facility] Once the Rx arrives - restart 7.5 mg 4x per week (Sun, Tues, Thurs, Sat) and 5 mg three days per week (M, W, F).    MEANWHILE - take 7.5mg 66mly but skip 1 day per week (Fridays).  This will = 45 mg per week which both are the same.    OK to take 800mg 30mrofen or 1 dose of Aleve about once every 3 days.      OA OF KNEES - Plan for knee steroid injection at next visit.  Follow up in 1-3  weeks. Will check your INR again at that visit also.    ELEVATED LIVER ENZYMES - Order placed for Liver ultrasound.  Have done at [facility].  [facility]-[phone].      CatherJannet Askew/14/2017, 11:44        Visit Diagnoses and Associated Orders, Previous Medication List, Allergies -- Summary:        ICD-10-CM    1. Mechanical heart valve present Z95.2 POCT EAST PROTHROMBIN TIME (AMB)           Medications:  Current Outpatient Prescriptions   Medication Sig    cyclobenzaprine (FLEXERIL) 5 mg Oral Tablet Take 1 Tab (5 mg total) by mouth Three times a day as needed for Muscle spasms    escitalopram oxalate (LEXAPRO) 20 mg Oral Tablet Take 1 Tab (20 mg total) by mouth Once a day    levETIRAcetam (KEPPRA) 1,000 mg Oral Tablet Take 1 Tab (1,000 mg total) by mouth Twice daily    OXcarbazepine (TRILEPTAL) 600 mg  Oral Tablet Take 600 mg by mouth One time    tamsulosin (FLOMAX) 0.4 mg Oral Capsule, Sust. Release 24 hr Take 1 Cap (0.4 mg total) by mouth Every evening after dinner    traZODone (DESYREL) 50 mg Oral Tablet Take 2 Tabs (100 mg total) by mouth Every night as needed for Insomnia    warfarin (COUMADIN) 5 mg Oral Tablet Take 1.5 Tabs (7.5 mg total) by mouth Every evening    warfarin (COUMADIN) 7.5 mg Oral Tablet Take 1 Tab (7.5 mg total) by mouth Once a day        Allergies:  Allergies   Allergen Reactions    Morphine  Other Adverse Reaction (Add comment)     combative

## 2015-07-28 NOTE — Patient Instructions (Addendum)
Rx for Coumadin 5 mg and 7.5mg  sent to [facility].    Once the Rx arrives - restart 7.5 mg 4x per week (Sun, Tues, Thurs, Sat) and 5 mg three days per week (M, W, F).    MEANWHILE - take 7.5mg  daily but skip 1 day per week (Fridays).  This will = 45 mg per week which both are the same.    OK to take 800mg  ibuprofen or 1 dose of Aleve about once every 3 days.      Plan for knee steroid injection at next visit.  Follow up in 1-3 weeks. Will check your INR again at that visit also.    Order placed for Liver ultrasound.  Have done at [facility].  [phone] to schedule.      Echocardiogram to assess your valve also ordered.

## 2015-07-29 ENCOUNTER — Other Ambulatory Visit (INDEPENDENT_AMBULATORY_CARE_PROVIDER_SITE_OTHER): Payer: Self-pay

## 2015-08-01 MED ORDER — LEVETIRACETAM 1,000 MG TABLET
1000.0000 mg | ORAL_TABLET | Freq: Two times a day (BID) | ORAL | 4 refills | Status: DC
Start: 2015-08-01 — End: 2015-09-29

## 2015-08-01 MED ORDER — TRAZODONE 50 MG TABLET
100.0000 mg | ORAL_TABLET | Freq: Every evening | ORAL | 2 refills | Status: DC | PRN
Start: 2015-08-01 — End: 2015-09-29

## 2015-08-01 MED ORDER — ESCITALOPRAM 20 MG TABLET
20.0000 mg | ORAL_TABLET | Freq: Every day | ORAL | 2 refills | Status: DC
Start: 2015-08-01 — End: 2016-07-17

## 2015-08-01 MED ORDER — TRAZODONE 50 MG TABLET
100.0000 mg | ORAL_TABLET | Freq: Every evening | ORAL | 2 refills | Status: DC | PRN
Start: 2015-08-01 — End: 2015-09-18

## 2015-08-01 MED ORDER — LEVETIRACETAM 1,000 MG TABLET
1000.0000 mg | ORAL_TABLET | Freq: Two times a day (BID) | ORAL | 4 refills | Status: DC
Start: 2015-08-01 — End: 2015-09-18

## 2015-08-01 MED ORDER — ESCITALOPRAM 20 MG TABLET
20.0000 mg | ORAL_TABLET | Freq: Every day | ORAL | 2 refills | Status: DC
Start: 2015-08-01 — End: 2015-09-07

## 2015-08-16 ENCOUNTER — Ambulatory Visit
Admission: RE | Admit: 2015-08-16 | Discharge: 2015-08-16 | Disposition: A | Discharge status: Home / Self Care | Payer: No Typology Code available for payment source | Source: Ambulatory Visit

## 2015-08-16 ENCOUNTER — Encounter (INDEPENDENT_AMBULATORY_CARE_PROVIDER_SITE_OTHER): Payer: Self-pay

## 2015-08-16 DIAGNOSIS — K760 Fatty (change of) liver, not elsewhere classified: Secondary | ICD-10-CM | POA: Insufficient documentation

## 2015-08-16 DIAGNOSIS — Z952 Presence of prosthetic heart valve: Secondary | ICD-10-CM

## 2015-08-16 DIAGNOSIS — R748 Abnormal levels of other serum enzymes: Secondary | ICD-10-CM | POA: Insufficient documentation

## 2015-08-18 ENCOUNTER — Other Ambulatory Visit (INDEPENDENT_AMBULATORY_CARE_PROVIDER_SITE_OTHER): Payer: Self-pay

## 2015-09-07 ENCOUNTER — Other Ambulatory Visit (INDEPENDENT_AMBULATORY_CARE_PROVIDER_SITE_OTHER): Payer: Self-pay

## 2015-09-07 DIAGNOSIS — Z952 Presence of prosthetic heart valve: Secondary | ICD-10-CM

## 2015-09-07 MED ORDER — WARFARIN 7.5 MG TABLET
7.5000 mg | ORAL_TABLET | ORAL | 2 refills | Status: DC
Start: 2015-09-07 — End: 2017-05-01

## 2015-09-07 MED ORDER — ESCITALOPRAM 20 MG TABLET
20.0000 mg | ORAL_TABLET | Freq: Every day | ORAL | 2 refills | Status: DC
Start: 2015-09-07 — End: 2015-12-20

## 2015-09-07 MED ORDER — OXCARBAZEPINE 600 MG TABLET
600.0000 mg | ORAL_TABLET | Freq: Two times a day (BID) | ORAL | 1 refills | Status: DC
Start: 2015-09-07 — End: 2015-09-18

## 2015-09-07 NOTE — Telephone Encounter (Signed)
New rx for mail order

## 2015-09-18 ENCOUNTER — Encounter (INDEPENDENT_AMBULATORY_CARE_PROVIDER_SITE_OTHER): Payer: Self-pay

## 2015-09-18 ENCOUNTER — Ambulatory Visit (INDEPENDENT_AMBULATORY_CARE_PROVIDER_SITE_OTHER): Payer: No Typology Code available for payment source

## 2015-09-18 VITALS — BP 118/66 | HR 72 | Temp 98.1°F | Resp 16 | Ht 70.5 in | Wt 245.0 lb

## 2015-09-18 DIAGNOSIS — E669 Obesity, unspecified: Secondary | ICD-10-CM

## 2015-09-18 DIAGNOSIS — Z5181 Encounter for therapeutic drug level monitoring: Secondary | ICD-10-CM

## 2015-09-18 DIAGNOSIS — G40909 Epilepsy, unspecified, not intractable, without status epilepticus: Secondary | ICD-10-CM

## 2015-09-18 DIAGNOSIS — Z952 Presence of prosthetic heart valve: Secondary | ICD-10-CM

## 2015-09-18 DIAGNOSIS — Z9089 Acquired absence of other organs: Secondary | ICD-10-CM

## 2015-09-18 DIAGNOSIS — Z7901 Long term (current) use of anticoagulants: Secondary | ICD-10-CM

## 2015-09-18 LAB — POCT EAST PROTHROMBIN TIME (AMB)
INR: 2.5
PROTHROMBIN TIME: 30.4

## 2015-09-18 NOTE — Patient Instructions (Signed)
Medicare Preventive Services  Medicare coverage information Recommendation for YOU   Heart Disease and Diabetes   Lipid profile every 5 years Lab Results   Component Value Date    CHOLESTEROL 185 10/06/2012    HDLCHOL 73 (H) 10/06/2012    LDLCHOL 92 10/06/2012    TRIG 98 10/06/2012        Diabetes Screening  yearly for those at risk for diabetes, 2 tests per year for those with prediabetes No results found for: GLUCOSEFAST    Diabetes Self Management Training or Medical Nutrition Therapy  for those with diabetes, up to 10 hrs initial training within a year, subsequent years up to 2 hrs of follow up training      Tobacco Cessation (Quitting) Counseling   two attempts per year, max 4 sessions per attempt, up to 8 per year, for those with tobacco-related health condition    Cancer Screening   Colorectal screening   for anyone age 24 or older, or any age if high risk:   Screening Colonoscopy every 10 yrs low risk,  every 24 months if high risk  OR   Flex Sig  every 4 yrs, or every 10 yrs after colonoscopy OR   Fecal Occult Blood Testing yearly OR   Barium Enema every 4 yrs low risk, every 24 months if high risk      Screening Pap Test and Pelvic Exam   every 2 years, yearly if  high risk or childbearing age with abnormal Pap in last 3 yrs    Screening Mammogram   yearly for women age 66 or older    Prostate Cancer Screening   yearly for men 75 or older   Digital Rectal Exam    Prostate Specific Antigen    Vaccinations   Pneumococcal Vaccine   once in lifetime, repeat in 5 years if high risk  Seasonal Influenza Vaccine  once per flu season  Hepatitis B Vaccine   3 doses if medium or high risk  Zostavax (shingles) Vaccine   once if age 81 or older with Medicare Part D  Diptheria Tetanus Pertussis Vaccine  Diptheria Tetanus Vaccine  Tetanus Toxoid Vaccine  Sometimes covered under Medicare Part D     Immunization History   Administered Date(s) Administered    DIPTH,PERTUSSIS-ACEL,TETANUS (ADACEL) >11 YRS OLD     (ADMIN) 09/29/2012    Influenza Vaccine IM (ADMIN) 02/04/2014      Other Screening   Bone Densitometry   every 24 months for anyone at risk       Glaucoma Screening   yearly if in high risk group such as diabetes, family history, African American age 28+ or Hispanic American age 69+      HIV Testing   yearly or up to 3 times in pregnancy     Abdominal Aortic Aneurysm Screening Ultrasound   once with Initial Welcome to Medicare Physical within 12 months of coverage if 65+AND male who smoked 100 cigs/lifetime OR   if 43 + with family history of AAA
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
## 2015-09-18 NOTE — Progress Notes (Signed)
09/18/15 1400   Blood   PT/INR (today) 30.4/2.5
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
## 2015-09-18 NOTE — Progress Notes (Signed)
09/18/15 1325   Medicare Wellness Assessment   Medicare initial or wellness physical in the last year? No   Advance Directives   Does patient have a living will or MPOA YES   Has patient provided [employer] with a copy? no   Advance directive information given to the patient today? no   Activities of Daily Living   Do you need help with dressing, bathing, or walking? No   Do you need help with shopping, housekeeping, medications, or finances? No   Do you have rugs in hallways, broken steps, or poor lighting? No   Do you have grab bars in your bathroom, non-slip strips in your tub, and hand rails on your stairs? Yes   Cognitive Function Screen   What is you age? 1   What is the time to the nearest hour? 1   What is the year? 1   What is the name of this clinic? 1   Can the patient recognize two persons (the doctor, the nurse, home help, etc.)? 1   What is the date of your birth? (day and month sufficient)  1   In what year did World War II end? 1   Who is the current president of the [place]? 1   Count from 20 down to 1? 1   What address did I give you earlier? 1   Total Score 10   Depression Screen   Little interest or pleasure in doing things. 0   Feeling down, depressed, or hopeless 0   PHQ 2 Total 0   Hearing Screen   Have you noticed any hearing difficulties? Yes   After whispering 9-1-6 how many numbers did the patient repeat correctly? 1   After whispering 4-7-8 how many numbers did the patient repeat correctly? 1   Total Correct 2   Fall Risk Assessment   Do you feel unsteady when standing or walking? No   Do you worry about falling? No   Have you fallen in the past year? No   Vision Screen   Right Eye = 20 20  (with glasses)   Left Eye = 20 20  (with glasses)

## 2015-09-19 ENCOUNTER — Encounter (INDEPENDENT_AMBULATORY_CARE_PROVIDER_SITE_OTHER): Payer: Self-pay

## 2015-09-19 NOTE — Progress Notes (Signed)
Left message for patient that DMV form completed and ready for pick up at front desk.    Caryl Asp, MA  09/19/2015, 09:22

## 2015-09-21 DIAGNOSIS — E669 Obesity, unspecified: Secondary | ICD-10-CM | POA: Insufficient documentation

## 2015-09-29 ENCOUNTER — Other Ambulatory Visit (INDEPENDENT_AMBULATORY_CARE_PROVIDER_SITE_OTHER): Payer: Self-pay

## 2015-10-03 MED ORDER — TRAZODONE 50 MG TABLET
100.0000 mg | ORAL_TABLET | Freq: Every evening | ORAL | 2 refills | Status: DC | PRN
Start: 2015-10-03 — End: 2015-12-15

## 2015-10-03 MED ORDER — LEVETIRACETAM 1,000 MG TABLET
1000.0000 mg | ORAL_TABLET | Freq: Two times a day (BID) | ORAL | 4 refills | Status: DC
Start: 2015-10-03 — End: 2016-09-02

## 2015-10-12 ENCOUNTER — Other Ambulatory Visit (INDEPENDENT_AMBULATORY_CARE_PROVIDER_SITE_OTHER): Payer: Self-pay

## 2015-10-12 NOTE — Telephone Encounter (Signed)
Patient would like to get a script for Chantex to quit smoking. Please advise    Uses [facility].

## 2015-10-19 NOTE — Telephone Encounter (Signed)
Patient called checking status of Chantex rx.

## 2015-10-23 ENCOUNTER — Telehealth (INDEPENDENT_AMBULATORY_CARE_PROVIDER_SITE_OTHER): Payer: Self-pay

## 2015-10-23 NOTE — Telephone Encounter (Signed)
Approved per [facility]. 04/14/15-04/14/16

## 2015-10-23 NOTE — Telephone Encounter (Signed)
Pa form faxed for cyclobenzaprine. Pending response.

## 2015-10-30 ENCOUNTER — Encounter (INDEPENDENT_AMBULATORY_CARE_PROVIDER_SITE_OTHER): Payer: Self-pay

## 2015-10-30 ENCOUNTER — Ambulatory Visit (INDEPENDENT_AMBULATORY_CARE_PROVIDER_SITE_OTHER): Payer: No Typology Code available for payment source

## 2015-10-30 VITALS — BP 132/70 | HR 76 | Temp 98.5°F | Resp 16 | Ht 71.0 in | Wt 241.0 lb

## 2015-10-30 DIAGNOSIS — Z720 Tobacco use: Secondary | ICD-10-CM | POA: Insufficient documentation

## 2015-10-30 DIAGNOSIS — Z952 Presence of prosthetic heart valve: Secondary | ICD-10-CM

## 2015-10-30 DIAGNOSIS — E669 Obesity, unspecified: Secondary | ICD-10-CM

## 2015-10-30 DIAGNOSIS — Z5181 Encounter for therapeutic drug level monitoring: Principal | ICD-10-CM

## 2015-10-30 DIAGNOSIS — Z7901 Long term (current) use of anticoagulants: Principal | ICD-10-CM

## 2015-10-30 DIAGNOSIS — R748 Abnormal levels of other serum enzymes: Secondary | ICD-10-CM

## 2015-10-30 DIAGNOSIS — K760 Fatty (change of) liver, not elsewhere classified: Secondary | ICD-10-CM

## 2015-10-30 LAB — POCT EAST PROTHROMBIN TIME (AMB)
INR: 1.6
PROTHROMBIN TIME: 19.3

## 2015-10-30 MED ORDER — VARENICLINE 0.5 MG (11)-1 MG (42) TABLETS IN A DOSE PACK
ORAL_TABLET | ORAL | 3 refills | Status: DC
Start: 2015-10-30 — End: 2016-05-28

## 2015-10-30 NOTE — Nursing Note (Signed)
10/30/15 1100   [facility] PT/INR   Date INR drawn 10/30/15   PT 19.3   INR 1.6   Initials sb

## 2015-10-30 NOTE — Patient Instructions (Addendum)
WEIGHT - Keep up the great work with the eating - lots of veggies and less/no carbs and working outside.  Be sure to drink enough water in this heat (at least 1/2 gal daily).    MECHANICAL VALVE - Increase coumadin dosing to 7.5mg  all days except Wed and Sat = 5 mg.      FATTY LIVER DISEASE - Recheck liver enzymes in 6 weeks.     TOBACCO - start Chantix 1 week before targeted quit date.  [place] Tobacco Quit Line - offer support 1-800-QUIT-NOW      Recheck INR in 2 weeks.
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
## 2015-10-30 NOTE — Progress Notes (Signed)
[facility]   PROBLEM-ORIENTED VISIT    SUBJECTIVE:  [name] is a 65 y.o. male with a chief complaint of   Chief Complaint   Patient presents with    Anticoagulation    Valve Repair     stooled BRB and liquidy stool  Had a few days of constipation and then stools became liquidy  Now resolved  Acetaminophen 650  5# in 6 weeks - lost eating LCHF.  Was 237# at home.   Lost more weight around the middle and can tie his own shoes    TOBACCO  Smoker of 1 ppd  Wife is on Chantix currently    MECHANICAL AORTIC VALVE  Eating salads every day  Taking coumadin 7.5 mg 4x per week (Sun, Tues, Thurs, Sat) and 5 mg three days per week (M, W, F).    EXAM:  BP 132/70   Pulse 76   Temp 36.9 C (98.5 F) (Oral)    Resp 16   Ht 1.803 m (5\' 11" )   Wt 109.3 kg (241 lb)   BMI 33.61 kg/m2  GEN: A&O, in NAD  PSYCH: Appropriate mood and broad affect. Thoughts logical and goal oriented, well groomed and appropriately dressed. Moderate eye contact. Insight good  CV: RRR, s1 and s2, murmur I/VI SEM  LUNGS: CTAB no w/r/r  SKIN: 2cm bruise left upper inner arm near axilla    INR low at 1.6 - likely 2/2 more salads  ASSESSMENT and PLAN:  WEIGHT - Keep up the great work with the eating - lots of veggies and less/no carbs and working outside.  Be sure to drink enough water in this heat (at least 1/2 gal daily).    MECHANICAL VALVE - Increase coumadin dosing to 7.5mg  all days except Wed and Sat = 5 mg.      FATTY LIVER DISEASE - Recheck liver enzymes in 6 weeks.     TOBACCO - start Chantix 1 week before targeted quit date.  [place] Tobacco Quit Line - offer support 1-800-QUIT-NOW    [name], DO 10/30/2015, 12:21        Visit Diagnoses and Associated Orders, Previous Medication List, Allergies -- Summary:        ICD-10-CM    1. Anticoagulation goal of INR 2 to 3 Z51.81 POCT EAST PROTHROMBIN TIME (AMB)    Z79.01            Medications:  Current Outpatient Prescriptions   Medication Sig    escitalopram oxalate (LEXAPRO)  20 mg Oral Tablet Take 1 Tab (20 mg total) by mouth Once a day    escitalopram oxalate (LEXAPRO) 20 mg Oral Tablet Take 1 Tab (20 mg total) by mouth Once a day    levETIRAcetam (KEPPRA) 1,000 mg Oral Tablet Take 1 Tab (1,000 mg total) by mouth Twice daily    OXcarbazepine (TRILEPTAL) 600 mg Oral Tablet Take 600 mg by mouth One time    tamsulosin (FLOMAX) 0.4 mg Oral Capsule, Sust. Release 24 hr Take 1 Cap (0.4 mg total) by mouth Every evening after dinner    traZODone (DESYREL) 50 mg Oral Tablet Take 2 Tabs (100 mg total) by mouth Every night as needed for Insomnia    warfarin (COUMADIN) 5 mg Oral Tablet Take 1 Tab (5 mg total) by mouth Every Monday, Wednesday and Friday    warfarin (COUMADIN) 7.5 mg Oral Tablet Take 1 Tab (7.5 mg total) by mouth Every Sunday, Tuesday, Thursday and Saturday  Allergies:  Allergies   Allergen Reactions    Morphine  Other Adverse Reaction (Add comment)     combative

## 2015-10-30 NOTE — Telephone Encounter (Signed)
Patient was seen in office today, 10/30/15, and given script for Chantix. rmc

## 2015-11-15 ENCOUNTER — Encounter (INDEPENDENT_AMBULATORY_CARE_PROVIDER_SITE_OTHER): Payer: Self-pay

## 2015-11-15 ENCOUNTER — Ambulatory Visit (INDEPENDENT_AMBULATORY_CARE_PROVIDER_SITE_OTHER): Payer: No Typology Code available for payment source

## 2015-11-15 VITALS — BP 130/70 | HR 62 | Temp 98.2°F | Resp 16 | Ht 71.0 in | Wt 241.0 lb

## 2015-11-15 DIAGNOSIS — K760 Fatty (change of) liver, not elsewhere classified: Secondary | ICD-10-CM

## 2015-11-15 DIAGNOSIS — G40909 Epilepsy, unspecified, not intractable, without status epilepticus: Secondary | ICD-10-CM

## 2015-11-15 DIAGNOSIS — Z7901 Long term (current) use of anticoagulants: Principal | ICD-10-CM

## 2015-11-15 DIAGNOSIS — IMO0001 Reserved for inherently not codable concepts without codable children: Secondary | ICD-10-CM

## 2015-11-15 DIAGNOSIS — R7309 Other abnormal glucose: Secondary | ICD-10-CM

## 2015-11-15 DIAGNOSIS — Z952 Presence of prosthetic heart valve: Secondary | ICD-10-CM

## 2015-11-15 DIAGNOSIS — Z0000 Encounter for general adult medical examination without abnormal findings: Secondary | ICD-10-CM

## 2015-11-15 DIAGNOSIS — Z720 Tobacco use: Secondary | ICD-10-CM

## 2015-11-15 DIAGNOSIS — Z5181 Encounter for therapeutic drug level monitoring: Principal | ICD-10-CM

## 2015-11-15 LAB — POCT EAST PROTHROMBIN TIME (AMB)
INR: 2.6
PROTHROMBIN TIME: 32
PROTHROMBIN TIME: 32

## 2015-11-15 MED ORDER — WARFARIN 5 MG TABLET
5.0000 mg | ORAL_TABLET | ORAL | 5 refills | Status: DC
Start: 2015-11-15 — End: 2017-05-01

## 2015-11-15 NOTE — Nursing Note (Signed)
11/15/15 0800   [facility] PT/INR   Date INR drawn 11/15/15   PT 32.0   INR 2.6   Initials sb
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
## 2015-11-15 NOTE — Patient Instructions (Signed)
PREVENTIVE CARE - check PSA (prostate cancer lab) and A1C (average blood sugar).     MECHANICAL VALVE - continue coumadin 7.5mg  all days except Wed and Sat = 5 mg.  [facility]     TOBACCO - keep up the great work!    Have labwork drawn next week (we will check another INR) and Dr. Darliss Cheney will contact you regarding the results.

## 2015-12-15 ENCOUNTER — Other Ambulatory Visit (INDEPENDENT_AMBULATORY_CARE_PROVIDER_SITE_OTHER): Payer: Self-pay

## 2015-12-20 ENCOUNTER — Ambulatory Visit (INDEPENDENT_AMBULATORY_CARE_PROVIDER_SITE_OTHER): Payer: No Typology Code available for payment source

## 2015-12-20 ENCOUNTER — Encounter (INDEPENDENT_AMBULATORY_CARE_PROVIDER_SITE_OTHER): Payer: Self-pay

## 2015-12-20 VITALS — BP 140/72 | HR 72 | Temp 98.9°F | Resp 16 | Ht 71.5 in | Wt 241.0 lb

## 2015-12-20 DIAGNOSIS — Z952 Presence of prosthetic heart valve: Principal | ICD-10-CM

## 2015-12-20 DIAGNOSIS — Z720 Tobacco use: Secondary | ICD-10-CM

## 2015-12-20 DIAGNOSIS — G4700 Insomnia, unspecified: Secondary | ICD-10-CM

## 2015-12-20 LAB — POCT EAST PROTHROMBIN TIME (AMB)
INR: 2.1
PROTHROMBIN TIME: 26.1

## 2015-12-20 NOTE — Progress Notes (Signed)
[facility]   PROBLEM-ORIENTED VISIT    SUBJECTIVE:  [name] is a 65 y.o. male with a chief complaint of   Chief Complaint   Patient presents with    Anticoagulation    Aortic Valve Replacement     TOBACCO  Had a routine of smoking when drinking coffee in AM or getting the car  Finds that Chantix is helpful.  Breaking the habit during those times is hard if cigs are in the house  But if they are in the house it's hard.  They are only in the house on days his wife is drinking.   Finished first month and filled Rx for second month - all 1 mg.   Had to pay $100 for the first month - ouch    MECHANICAL VALVE  INR of 2.1 today  coumadin 7.5mg  all days except Wed and Sat = 5 mg    Sleep overall has been good - taking melatonin which is helpful.   Taking trazodone 2-3 x per week  Didn't sleep well last night though but didn't take medication. Was up a lot b/c had some tummy trouble.   Mood is good    Stated taking Nugenix which is vitamin B6, vitamin B12, zinc, and a proprietary blend (fenugreek extract, L-citrulline, and tribulus terrestris) - started 2 weeks ago  Feels his energy level has improved      EXAM:  BP 140/72   Pulse 72   Temp 37.2 C (98.9 F) (Oral)    Resp 16   Ht 1.816 m (5' 11.5")   Wt 109.3 kg (241 lb)   BMI 33.14 kg/m2  GEN: A&O, in NAD  PSYCH: Appropriate mood and broad affect. Thoughts logical and goal oriented, well groomed and appropriately dressed. Moderate eye contact. Insight good.  CV: RRR, s1 and s2, no murmurs. s2 click.  LUNGS: CTAB no w/r/r      ASSESSMENT and PLAN:  MECHANICAL VALVE - INR today at 2.1. Continue current dosing.  Repeat INR in 4-6 weeks.   TOBACCO - continue Chantix.  Keep up the good work.  Informed about [place] quit line help with payment.  SUPPLEMENTs -reviewed that tribulus terrestris can worsen insomnia and may contribute to prostate problems.   [name], DO 12/20/2015, 11:19        Visit Diagnoses and Associated Orders, Previous Medication  List, Allergies -- Summary:        ICD-10-CM    1. Aortic Mechanical heart valve present Z95.2 POCT EAST PROTHROMBIN TIME (AMB)           Medications:  Current Outpatient Prescriptions   Medication Sig    escitalopram oxalate (LEXAPRO) 20 mg Oral Tablet Take 1 Tab (20 mg total) by mouth Once a day    escitalopram oxalate (LEXAPRO) 20 mg Oral Tablet Take 1 Tab (20 mg total) by mouth Once a day    levETIRAcetam (KEPPRA) 1,000 mg Oral Tablet Take 1 Tab (1,000 mg total) by mouth Twice daily    OXcarbazepine (TRILEPTAL) 600 mg Oral Tablet Take 600 mg by mouth One time    tamsulosin (FLOMAX) 0.4 mg Oral Capsule, Sust. Release 24 hr Take 1 Cap (0.4 mg total) by mouth Every evening after dinner    traZODone (DESYREL) 50 mg Oral Tablet TAKE 2 TABLETS EVERY NIGHT AS NEEDED FOR INSOMNIA    varenicline (CHANTIX) dose pak Days 1 to 3: 0.5 mg once daily, Days 4 to 7: 0.5 mg twice daily,  Maintenance (= Day 8): 1 mg twice daily for 11 weeks    warfarin (COUMADIN) 5 mg Oral Tablet Take 1 Tab (5 mg total) by mouth Every Monday, Wednesday and Friday    warfarin (COUMADIN) 7.5 mg Oral Tablet Take 1 Tab (7.5 mg total) by mouth Every Sunday, Tuesday, Thursday and Saturday        Allergies:  Allergies   Allergen Reactions    Morphine  Other Adverse Reaction (Add comment)     combative
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
## 2015-12-20 NOTE — Nursing Note (Signed)
12/20/15 1000   [facility] PT/INR   Date INR drawn 12/20/15   PT 26.1   INR 2.1   Initials sb

## 2016-02-09 ENCOUNTER — Encounter (INDEPENDENT_AMBULATORY_CARE_PROVIDER_SITE_OTHER): Payer: Self-pay

## 2016-02-24 ENCOUNTER — Other Ambulatory Visit: Payer: Self-pay

## 2016-03-15 ENCOUNTER — Encounter (INDEPENDENT_AMBULATORY_CARE_PROVIDER_SITE_OTHER): Payer: No Typology Code available for payment source

## 2016-03-23 ENCOUNTER — Other Ambulatory Visit (INDEPENDENT_AMBULATORY_CARE_PROVIDER_SITE_OTHER): Payer: Self-pay

## 2016-04-03 ENCOUNTER — Other Ambulatory Visit (INDEPENDENT_AMBULATORY_CARE_PROVIDER_SITE_OTHER): Payer: Self-pay

## 2016-05-28 ENCOUNTER — Ambulatory Visit (INDEPENDENT_AMBULATORY_CARE_PROVIDER_SITE_OTHER): Payer: Medicare Other

## 2016-05-28 ENCOUNTER — Encounter (INDEPENDENT_AMBULATORY_CARE_PROVIDER_SITE_OTHER): Payer: Self-pay

## 2016-05-28 VITALS — BP 122/76 | HR 100 | Temp 98.1°F | Resp 20 | Ht 71.5 in | Wt 253.0 lb

## 2016-05-28 DIAGNOSIS — R05 Cough: Secondary | ICD-10-CM | POA: Diagnosis not present

## 2016-05-28 DIAGNOSIS — J101 Influenza due to other identified influenza virus with other respiratory manifestations: Secondary | ICD-10-CM | POA: Diagnosis not present

## 2016-05-28 DIAGNOSIS — R059 Cough, unspecified: Secondary | ICD-10-CM

## 2016-05-28 MED ORDER — BENZONATATE 200 MG CAPSULE: 200 mg | Cap | Freq: Three times a day (TID) | ORAL | 0 refills | 0 days | Status: DC
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
## 2016-05-28 NOTE — Nursing Note (Signed)
05/28/16 0900   Rapid Flu   Rapid Flu A Result Positive   Rapid Flu B Result Negative   Lot# [id number]   Expiration Date 02/09/18   Internal Control Valid yes   Initials JLS   Influenza Culture Sent No

## 2016-05-28 NOTE — Progress Notes (Signed)
[place]  [place] [address]    Name: [name]  MRN: [id number]  DOB: 1951/01/22    Date of Service: 05/28/2016    Chief Complaint:   Chief Complaint     Cough     Congestion     Fever               HPI: Patient is a 66 y.o. male presenting with cough. The history is provided by the patient and medical records.   Cough   This is a new problem. Episode onset: more then 72 hours ago. The problem has been gradually worsening. The cough is non-productive. Associated symptoms include chills, ear congestion, a fever (subjective), myalgias, nasal congestion, postnasal drip, rhinorrhea and a sore throat. Pertinent negatives include no chest pain, headaches, heartburn, hemoptysis, rash, shortness of breath, sweats, weight loss or wheezing. Nothing aggravates the symptoms. Risk factors for lung disease include smoking/tobacco exposure. He has tried nothing for the symptoms.   sick contact is wife w/ similar sxms.  Did not get flu vacc this year.  Taking theraflu, minimal help.  Nasal congestion affectng sleep.  No SOB, CP, HA, neck stiffness or pain.    Past Medical History  Current Outpatient Prescriptions   Medication Sig   . Benzonatate (TESSALON) 200 mg Oral Capsule Take 1 Cap (200 mg total) by mouth Three times a day   . escitalopram oxalate (LEXAPRO) 20 mg Oral Tablet Take 1 Tab (20 mg total) by mouth Once a day   . levETIRAcetam (KEPPRA) 1,000 mg Oral Tablet Take 1 Tab (1,000 mg total) by mouth Twice daily   . OXcarbazepine (TRILEPTAL) 600 mg Oral Tablet Take 600 mg by mouth One time   . tamsulosin (FLOMAX) 0.4 mg Oral Capsule, Sust. Release 24 hr Take 1 Cap (0.4 mg total) by mouth Every evening after dinner   . traZODone (DESYREL) 50 mg Oral Tablet TAKE 2 TABLETS EVERY NIGHT AS NEEDED FOR INSOMNIA   . traZODone (DESYREL) 50 mg Oral Tablet TAKE 2 TABS (100 MG TOTAL) BY MOUTH EVERY NIGHT AS NEEDED FOR INSOMNIA   . warfarin (COUMADIN) 5 mg Oral  Tablet Take 1 Tab (5 mg total) by mouth Every Monday, Wednesday and Friday   . warfarin (COUMADIN) 7.5 mg Oral Tablet Take 1 Tab (7.5 mg total) by mouth Every Sunday, Tuesday, Thursday and Saturday     Allergies   Allergen Reactions   . Morphine  Other Adverse Reaction (Add comment)     combative     Past Medical History:   Diagnosis Date   . Abnormal EKG    . Anxiety    . Aortic valve replaced    . Bladder stone    . Depression    . Diverticulitis    . Kidney stones    . Rectal bleeding    . Seizures    . Viral hepatitis C    . Wears glasses          Past Surgical History:   Procedure Laterality Date   . HX AORTIC VALVE REPLACEMENT     . HX CHOLECYSTECTOMY     . HX COLECTOMY  2010    at [facility] for diverticulitis.   Marland Kitchen HX COLONOSCOPY     . HX KNEE REPLACMENT Left          Family Medical History     Problem Relation (Age of Onset)  COPD Mother    Heart Disease Father    Heart Surgery Father    Prostate Cancer Father (85)            Social History     Social History   . Marital status: Married     Spouse name: Olin Hauser   . Number of children: 2   . Years of education: 12     Occupational History   . disabled [place] History Main Topics   . Smoking status: Current Every Day Smoker     Packs/day: 1.00     Last attempt to quit: 06/01/2013   . Smokeless tobacco: Never Used   . Alcohol use No   . Drug use: No   . Sexual activity: Yes     Partners: Female     Other Topics Concern   . Ability To Walk 1 Flight Of Steps Without Sob/Cp Yes   . Ability To Walk 2 Flight Of Steps Without Sob/Cp Yes   . Ability To Do Own Adl's Yes     Social History Narrative       Review of Systems   Constitutional: Positive for activity change, appetite change, chills, fatigue and fever (subjective). Negative for weight loss.   HENT: Positive for congestion, postnasal drip, rhinorrhea and sore throat. Negative for ear discharge, sinus pressure, trouble swallowing and voice change.    Eyes: Negative for visual disturbance.    Respiratory: Positive for cough. Negative for hemoptysis, shortness of breath, wheezing and stridor.    Cardiovascular: Negative for chest pain, palpitations and leg swelling.   Gastrointestinal: Negative for abdominal pain, constipation, diarrhea, heartburn, nausea and vomiting.   Endocrine: Negative for polydipsia, polyphagia and polyuria.   Genitourinary: Negative for hematuria.   Musculoskeletal: Positive for myalgias. Negative for joint swelling and neck pain.   Skin: Negative for rash.   Neurological: Negative for dizziness, weakness, light-headedness, numbness and headaches.   Psychiatric/Behavioral: Negative for dysphoric mood. The patient is not nervous/anxious.        Physical Exam   Constitutional: He is well-developed, well-nourished, and in no distress. No distress.   HENT:   Head: Normocephalic and atraumatic.   Right Ear: Hearing, tympanic membrane, external ear and ear canal normal.   Left Ear: Hearing, tympanic membrane, external ear and ear canal normal.   Nose: Nose normal.   Mouth/Throat: Oropharynx is clear and moist. No oropharyngeal exudate.   Eyes: Conjunctivae and EOM are normal. Pupils are equal, round, and reactive to light. Right eye exhibits no discharge. Left eye exhibits no discharge. No scleral icterus.   Neck: Neck supple. No JVD present. No tracheal deviation present. No thyromegaly present.   Cardiovascular: Normal rate.    Pulmonary/Chest: Effort normal and breath sounds normal. No respiratory distress. He has no wheezes. He has no rales.   Abdominal: Soft. There is no tenderness.   Musculoskeletal: He exhibits no edema.   Lymphadenopathy:     He has no cervical adenopathy.   Neurological: He is alert.   Skin: No rash noted. He is not diaphoretic.   Psychiatric: Mood and affect normal.       Assessment and Plan:    1. Influenza A  Not candidate for Tamiflu, sxms started more then 72 hours ago.  Discussed use of OTC meds for sxms.  Alarm s/s discussed.  Superimposed CAP,  meningitis, and other bacterial infections discussed.  If worsens or new sxms or new improvement in 3-4 days  then f/u w/ provider.  - POCT INFLUENZA A & B  - Benzonatate (TESSALON) 200 mg Oral Capsule; Take 1 Cap (200 mg total) by mouth Three times a day  Dispense: 30 Cap; Refill: 0    2. Cough  - POCT INFLUENZA A & B  - Benzonatate (TESSALON) 200 mg Oral Capsule; Take 1 Cap (200 mg total) by mouth Three times a day  Dispense: 30 Cap; Refill: 0                  Discussed alarm signs and symptoms to follow up for immediately, natural course, differential diagnosis, use of OTC meds, use and side effects of Rx meds.  If symptoms worsen or new symptoms arise, follow up with a physician.  Pt/caregiver acknowledged understanding of our discussion.      Esmond Camper, MD  05/28/2016, 09:16

## 2016-05-29 ENCOUNTER — Telehealth (INDEPENDENT_AMBULATORY_CARE_PROVIDER_SITE_OTHER): Payer: Self-pay

## 2016-05-29 NOTE — Telephone Encounter (Signed)
Patient was in [facility] in [place]  on 05/28/2016. Contacted Hershal Coria  to check health status.Wife stated  [name] is feeling better. No questions or concerns at this time. Advised to call back if there are any questions or concerns, or if condition worsens to call the office or go to the Urgent Care or ED . Patient stated their understanding.  Driscilla Moats, [place] 05/29/2016, 08:55

## 2016-05-30 ENCOUNTER — Ambulatory Visit (INDEPENDENT_AMBULATORY_CARE_PROVIDER_SITE_OTHER): Payer: Medicare Other

## 2016-05-30 ENCOUNTER — Encounter (INDEPENDENT_AMBULATORY_CARE_PROVIDER_SITE_OTHER): Payer: Self-pay

## 2016-05-30 VITALS — BP 110/78 | HR 88 | Temp 98.0°F | Resp 18 | Ht 72.0 in | Wt 248.4 lb

## 2016-05-30 DIAGNOSIS — Z13220 Encounter for screening for lipoid disorders: Secondary | ICD-10-CM | POA: Diagnosis not present

## 2016-05-30 DIAGNOSIS — G40909 Epilepsy, unspecified, not intractable, without status epilepticus: Secondary | ICD-10-CM | POA: Diagnosis not present

## 2016-05-30 DIAGNOSIS — Z7901 Long term (current) use of anticoagulants: Secondary | ICD-10-CM | POA: Diagnosis not present

## 2016-05-30 DIAGNOSIS — Z952 Presence of prosthetic heart valve: Secondary | ICD-10-CM | POA: Diagnosis not present

## 2016-05-30 DIAGNOSIS — R7309 Other abnormal glucose: Secondary | ICD-10-CM | POA: Diagnosis not present

## 2016-05-30 DIAGNOSIS — K760 Fatty (change of) liver, not elsewhere classified: Secondary | ICD-10-CM | POA: Diagnosis not present

## 2016-05-30 DIAGNOSIS — Z5181 Encounter for therapeutic drug level monitoring: Secondary | ICD-10-CM | POA: Diagnosis not present

## 2016-05-30 LAB — POCT EAST PROTHROMBIN TIME (AMB)
INR: 4.1
PROTHROMBIN TIME: 49.9

## 2016-05-30 NOTE — Nursing Note (Signed)
05/30/16 1000   [facility] PT/INR   Date INR drawn 05/30/16   PT 49.9   INR 4.1   Initials kdp   Goal INR 2 to 3

## 2016-05-30 NOTE — Patient Instructions (Addendum)
COUMADIN - skip Friday morninings 7.5mg  dose.  Restart your usual regimen of 5 mg 3x per week and 7.5mg  4x per week.  Recheck INR in 7-10 days - just have it drawn at [facility] [place] or the lab at [facility] and [place] or Dr. Darliss Cheney will call and tell what to do with your coumadin dosing.     Have labwork checked in 1 week - liver and kidney functions, blood sugar, blood cell counts. Cholesterol.  MUST BE FASTING x 12 hours - water ok.

## 2016-05-30 NOTE — Nursing Note (Signed)
Chief Complaint:   Anticoagulation (INR)    Functional Health Screen        Vital Signs  BP 110/78  Pulse 88  Temp 36.7 C (98 F) (Oral)   Resp 18  Ht 1.829 m (6')  Wt 112.7 kg (248 lb 6.4 oz)  BMI 33.69 kg/m2  History   Smoking Status   . Current Every Day Smoker   . Packs/day: 1.00   . Last attempt to quit: 06/01/2013   Smokeless Tobacco   . Never Used     Allergies  Allergies   Allergen Reactions   . Morphine  Other Adverse Reaction (Add comment)     combative     Medication History  Reviewed for OTC medication and any new medications, provider will review medication history  Care Team  Patient Care Team:  [name], DO as PCP - General [place] [facility] [place])  Oran Rein, MD as PCP - Cardiologist (EXTERNAL)    Maretta Bees, LPN  [id number], [id number]

## 2016-05-30 NOTE — Progress Notes (Signed)
[facility]   PROBLEM-ORIENTED VISIT    SUBJECTIVE:  [name] is a 66 y.o. male with a chief complaint of   Chief Complaint   Patient presents with   . Anticoagulation     INR     Influenza A positive on visit 2/13 - starting to improve. Using OTC   Runny nose constantly and coughing have limited sleep  Wife stays up late.  But he prefers to go to bed at 11pm and up at 8am.  No longer taking Nugenix - thinks wife took his bottle - not sure, though energy had improved on it.   Last level of folate was 10.6 and B12 was 492 in 2014.   Wife's parents are "essentially on death watch". Wife is not well - open wound of the shoulder - inadequate care.    MECHANICAL VALVE  INR elevated 4.1   4 days of 7.5 and 3 days of 5 mg alternating. (Monday, Wed, Thurs at 5 mg).   No missed doses.   No bleeding or bruising problems.   Was on 47.5 mg per week but is now on 45 mg per week.   has been taking a lot of OTC meds and wonders if that could contribute to the high INR    SOC HX - smoking 1/2 ppd which is slightly less.     EXAM:  BP 110/78  Pulse 88  Temp 36.7 C (98 F) (Oral)   Resp 18  Ht 1.829 m (6')  Wt 112.7 kg (248 lb 6.4 oz)  BMI 33.69 kg/m2  GEN: A&O, in NAD  PSYCH: Appropriate mood and broad affect. Somewhat abboulious. Thoughts logical and goal oriented, dissheveled appearace and poor grooming - hair is wild. Moderate eye contact. Insight good.  CV: RRR, s1 and s2 (click), no murmurs  LUNGS: CTAB no w/r/r  SKIN: no bruising or hematomas b/l forearms/hands or face    ASSESSMENT and PLAN:  COUMADIN for Mechanical Valve - skip Friday morninings 7.5mg  dose.  Restart your usual regimen of 5 mg 3x per week and 7.5mg  4x per week.  Recheck INR in 7-10 days - just have it drawn at [facility] or the lab at [facility] and [place] or Dr. Darliss Cheney will call and tell what to do with your coumadin dosing.     SEIZURE D/O - last set of labs 1 yr ago.  Will recheck labs noted below. Have labwork  checked in 1 week - liver and kidney functions, blood sugar, blood cell counts. Cholesterol.  MUST BE FASTING x 12 hours - water ok.     ELEVATED RANDOM BG - will check A1c.     [name], DO 05/30/2016, 10:29        Visit Diagnoses and Associated Orders, Previous Medication List, Allergies -- Summary:        ICD-10-CM    1. Anticoagulation management encounter Z51.81 POCT EAST PROTHROMBIN TIME (AMB)    Z79.01    2. Elevated random blood glucose level R73.09 HGA1C (HEMOGLOBIN A1C WITH EST AVG GLUCOSE)     CANCELED: POCT EAST HGB A1C (AMB)   3. Aortic Mechanical heart valve present Z95.2 PT/INR     VITAMIN 123456     FOLIC ACID, RBC - [facility] ONLY   4. Epilepsy (HCC) G40.909 COMPREHENSIVE METABOLIC PROFILE - [facility] ONLY     CBC/DIFF     VITAMIN 123456     FOLIC ACID, RBC - [facility] ONLY  5. Screening cholesterol level Z13.220 LIPID PANEL   6. Fatty liver disease, nonalcoholic [id number]            Medications:  Current Outpatient Prescriptions   Medication Sig   . Benzonatate (TESSALON) 200 mg Oral Capsule Take 1 Cap (200 mg total) by mouth Three times a day   . escitalopram oxalate (LEXAPRO) 20 mg Oral Tablet Take 1 Tab (20 mg total) by mouth Once a day   . levETIRAcetam (KEPPRA) 1,000 mg Oral Tablet Take 1 Tab (1,000 mg total) by mouth Twice daily   . OXcarbazepine (TRILEPTAL) 600 mg Oral Tablet Take 600 mg by mouth One time   . tamsulosin (FLOMAX) 0.4 mg Oral Capsule, Sust. Release 24 hr Take 1 Cap (0.4 mg total) by mouth Every evening after dinner   . traZODone (DESYREL) 50 mg Oral Tablet TAKE 2 TABLETS EVERY NIGHT AS NEEDED FOR INSOMNIA   . traZODone (DESYREL) 50 mg Oral Tablet TAKE 2 TABS (100 MG TOTAL) BY MOUTH EVERY NIGHT AS NEEDED FOR INSOMNIA   . warfarin (COUMADIN) 5 mg Oral Tablet Take 1 Tab (5 mg total) by mouth Every Monday, Wednesday and Friday   . warfarin (COUMADIN) 7.5 mg Oral Tablet Take 1 Tab (7.5 mg total) by mouth Every Sunday, Tuesday, Thursday and Saturday        Allergies:  Allergies   Allergen  Reactions   . Morphine  Other Adverse Reaction (Add comment)     combative

## 2016-06-17 ENCOUNTER — Encounter (INDEPENDENT_AMBULATORY_CARE_PROVIDER_SITE_OTHER): Payer: Self-pay

## 2016-06-17 ENCOUNTER — Ambulatory Visit: Payer: Medicare Other

## 2016-06-17 DIAGNOSIS — Z952 Presence of prosthetic heart valve: Secondary | ICD-10-CM | POA: Diagnosis not present

## 2016-06-17 DIAGNOSIS — Z79899 Other long term (current) drug therapy: Secondary | ICD-10-CM | POA: Diagnosis not present

## 2016-06-17 DIAGNOSIS — G40909 Epilepsy, unspecified, not intractable, without status epilepticus: Secondary | ICD-10-CM | POA: Diagnosis not present

## 2016-06-17 DIAGNOSIS — R7309 Other abnormal glucose: Secondary | ICD-10-CM | POA: Diagnosis not present

## 2016-06-17 DIAGNOSIS — Z13220 Encounter for screening for lipoid disorders: Secondary | ICD-10-CM | POA: Diagnosis not present

## 2016-06-17 DIAGNOSIS — Z87448 Personal history of other diseases of urinary system: Secondary | ICD-10-CM

## 2016-06-17 DIAGNOSIS — Z8042 Family history of malignant neoplasm of prostate: Secondary | ICD-10-CM

## 2016-06-17 DIAGNOSIS — R3129 Other microscopic hematuria: Secondary | ICD-10-CM

## 2016-06-17 LAB — CBC WITH DIFF
BASOPHIL #: 0.2 x10ˆ3/uL — ABNORMAL HIGH (ref 0.00–0.10)
BASOPHIL %: 2 % (ref 0–3)
EOSINOPHIL #: 0.1 x10ˆ3/uL (ref 0.00–0.50)
EOSINOPHIL %: 1 % (ref 0–5)
HCT: 49.3 % (ref 40.0–50.0)
HGB: 16.3 g/dL (ref 13.5–18.0)
LYMPHOCYTE #: 1.9 x10ˆ3/uL (ref 1.00–4.80)
LYMPHOCYTE %: 19 % (ref 15–43)
MCH: 31.1 pg (ref 27.5–33.2)
MCHC: 33.1 g/dL (ref 32.0–36.0)
MCV: 94.1 fL (ref 82.0–97.0)
MONOCYTE #: 1 x10ˆ3/uL — ABNORMAL HIGH (ref 0.20–0.90)
MONOCYTE %: 10 % (ref 5–12)
MPV: 8.7 fL (ref 7.4–10.5)
NEUTROPHIL #: 7 x10?3/uL — ABNORMAL HIGH (ref 1.50–6.50)
NEUTROPHIL %: 69 % (ref 43–76)
PLATELETS: 227 x10ˆ3/uL (ref 150–450)
RBC: 5.25 x10ˆ6/uL (ref 4.40–5.80)
RDW: 14.6 % (ref 11.0–16.0)
WBC: 10.2 x10ˆ3/uL (ref 4.0–11.0)

## 2016-06-17 LAB — COMPREHENSIVE METABOLIC PROFILE - BMC/JMC ONLY
ALBUMIN/GLOBULIN RATIO: 0.9 (ref 0.8–2.0)
ALBUMIN: 3.7 g/dL (ref 3.5–5.0)
ALKALINE PHOSPHATASE: 124 U/L (ref 38–126)
ALT (SGPT): 44 U/L (ref 17–63)
ANION GAP: 7 mmol/L (ref 3–11)
AST (SGOT): 55 U/L — ABNORMAL HIGH (ref 15–41)
AST (SGOT): 55 U/L — ABNORMAL HIGH (ref 15–41)
BILIRUBIN TOTAL: 1.1 mg/dL (ref 0.3–1.2)
BUN/CREA RATIO: 23 — ABNORMAL HIGH (ref 6–22)
BUN: 26 mg/dL — ABNORMAL HIGH (ref 6–20)
CALCIUM: 9.1 mg/dL (ref 8.8–10.2)
CHLORIDE: 101 mmol/L (ref 101–111)
CO2 TOTAL: 28 mmol/L (ref 22–32)
CREATININE: 1.11 mg/dL (ref 0.61–1.24)
ESTIMATED GFR: >60 mL/min/1.73mˆ2 (ref >60)
ESTIMATED GFR: >60 mL/min/{1.73_m2} (ref >60)
GLUCOSE: 112 mg/dL — ABNORMAL HIGH (ref 70–110)
POTASSIUM: 4.5 mmol/L (ref 3.4–5.1)
PROTEIN TOTAL: 7.6 g/dL (ref 6.4–8.3)
SODIUM: 136 mmol/L (ref 136–145)

## 2016-06-17 LAB — LIPID PANEL
CHOL/HDL RATIO: 3.6
CHOLESTEROL: 181 mg/dL (ref 120–199)
HDL CHOL: 50 mg/dL (ref 35–55)
LDL CALC: 113 mg/dL (ref <=130)
TRIGLYCERIDES: 91 mg/dL (ref 40–160)
VLDL CALC: 18 mg/dL (ref 5–35)

## 2016-06-17 LAB — PT/INR
INR: 1.54
PROTHROMBIN TIME: 17 s — ABNORMAL HIGH (ref 9.2–12.3)

## 2016-06-17 LAB — HGA1C (HEMOGLOBIN A1C WITH EST AVG GLUCOSE)
ESTIMATED AVERAGE GLUCOSE: 111 mg/dL — ABNORMAL HIGH (ref 70–110)
GLYCOHEMOGLOBIN (HBA1C): 5.5 % (ref 4.0–6.0)

## 2016-06-18 LAB — VITAMIN B12: VITAMIN B 12: 1030 pg/mL — ABNORMAL HIGH (ref 180–914)

## 2016-06-19 ENCOUNTER — Other Ambulatory Visit (INDEPENDENT_AMBULATORY_CARE_PROVIDER_SITE_OTHER): Payer: Self-pay

## 2016-06-19 LAB — FOLIC ACID, RBC - BMC/JMC ONLY: FOLATE, RBC: 170 ng/mL — ABNORMAL LOW (ref >280)

## 2016-06-19 MED ORDER — TRAZODONE 50 MG TABLET
ORAL_TABLET | ORAL | 2 refills | Status: DC
Start: 2016-06-19 — End: 2016-08-13

## 2016-06-20 ENCOUNTER — Encounter (INDEPENDENT_AMBULATORY_CARE_PROVIDER_SITE_OTHER): Payer: Self-pay

## 2016-06-20 DIAGNOSIS — D529 Folate deficiency anemia, unspecified: Secondary | ICD-10-CM | POA: Insufficient documentation

## 2016-06-20 NOTE — Progress Notes (Signed)
Pt has h/o hematuria on UA. Brother just dx with prostate CA. Would like to have PSA checked. Lab order placed.  [name], DO  06/20/2016, 17:51

## 2016-06-28 ENCOUNTER — Ambulatory Visit: Payer: Medicare Other

## 2016-06-28 DIAGNOSIS — R3129 Other microscopic hematuria: Secondary | ICD-10-CM | POA: Diagnosis not present

## 2016-06-28 DIAGNOSIS — Z8042 Family history of malignant neoplasm of prostate: Secondary | ICD-10-CM | POA: Diagnosis not present

## 2016-06-28 DIAGNOSIS — Z87448 Personal history of other diseases of urinary system: Secondary | ICD-10-CM | POA: Diagnosis not present

## 2016-06-28 LAB — PSA DIAGNOSTIC WITH FREE PSA REFLEX: PSA: 0.3 ng/mL (ref 0.1–4.0)

## 2016-07-17 ENCOUNTER — Encounter (INDEPENDENT_AMBULATORY_CARE_PROVIDER_SITE_OTHER): Payer: No Typology Code available for payment source

## 2016-07-17 ENCOUNTER — Other Ambulatory Visit (INDEPENDENT_AMBULATORY_CARE_PROVIDER_SITE_OTHER): Payer: Self-pay

## 2016-07-17 MED ORDER — ESCITALOPRAM 20 MG TABLET
20.0000 mg | ORAL_TABLET | Freq: Every day | ORAL | 2 refills | Status: DC
Start: 2016-07-17 — End: 2017-04-14

## 2016-08-13 ENCOUNTER — Other Ambulatory Visit (INDEPENDENT_AMBULATORY_CARE_PROVIDER_SITE_OTHER): Payer: Self-pay

## 2016-08-14 ENCOUNTER — Encounter (INDEPENDENT_AMBULATORY_CARE_PROVIDER_SITE_OTHER): Payer: No Typology Code available for payment source

## 2016-08-14 ENCOUNTER — Other Ambulatory Visit (INDEPENDENT_AMBULATORY_CARE_PROVIDER_SITE_OTHER): Payer: Self-pay

## 2016-08-14 MED ORDER — TAMSULOSIN 0.4 MG CAPSULE
0.4000 mg | ORAL_CAPSULE | Freq: Every evening | ORAL | 5 refills | Status: DC
Start: 2016-08-14 — End: 2017-05-01

## 2016-08-14 MED ORDER — TRAZODONE 50 MG TABLET
ORAL_TABLET | ORAL | 2 refills | Status: DC
Start: 2016-08-14 — End: 2017-04-22

## 2016-08-14 MED ORDER — OXCARBAZEPINE 600 MG TABLET
600.0000 mg | ORAL_TABLET | Freq: Once | ORAL | 0 refills | Status: DC
Start: 2016-08-14 — End: 2017-04-22

## 2016-08-26 ENCOUNTER — Encounter (INDEPENDENT_AMBULATORY_CARE_PROVIDER_SITE_OTHER): Payer: No Typology Code available for payment source

## 2016-09-02 ENCOUNTER — Other Ambulatory Visit (INDEPENDENT_AMBULATORY_CARE_PROVIDER_SITE_OTHER): Payer: Self-pay

## 2016-09-03 MED ORDER — LEVETIRACETAM 1,000 MG TABLET
1000.0000 mg | ORAL_TABLET | Freq: Two times a day (BID) | ORAL | 4 refills | Status: DC
Start: 2016-09-03 — End: 2017-05-01

## 2016-09-13 ENCOUNTER — Encounter (INDEPENDENT_AMBULATORY_CARE_PROVIDER_SITE_OTHER): Payer: No Typology Code available for payment source

## 2016-09-13 ENCOUNTER — Encounter (INDEPENDENT_AMBULATORY_CARE_PROVIDER_SITE_OTHER): Payer: Self-pay

## 2016-09-22 ENCOUNTER — Other Ambulatory Visit (INDEPENDENT_AMBULATORY_CARE_PROVIDER_SITE_OTHER): Payer: Self-pay

## 2016-12-10 ENCOUNTER — Encounter (INDEPENDENT_AMBULATORY_CARE_PROVIDER_SITE_OTHER): Payer: Self-pay | Admitting: Family Medicine

## 2016-12-10 ENCOUNTER — Ambulatory Visit: Payer: Medicare Other | Attending: Family Medicine

## 2016-12-10 ENCOUNTER — Ambulatory Visit (INDEPENDENT_AMBULATORY_CARE_PROVIDER_SITE_OTHER): Payer: Medicare Other | Admitting: Family Medicine

## 2016-12-10 VITALS — BP 116/74 | HR 74 | Temp 98.4°F | Resp 14 | Ht 72.0 in | Wt 245.5 lb

## 2016-12-10 DIAGNOSIS — G40909 Epilepsy, unspecified, not intractable, without status epilepticus: Secondary | ICD-10-CM | POA: Diagnosis not present

## 2016-12-10 LAB — CBC WITH DIFF
BASOPHIL #: 0.1 x10?3/uL (ref 0.00–0.10)
BASOPHIL #: 0.1 x10ˆ3/uL (ref 0.00–0.10)
BASOPHIL %: 1 % (ref 0–3)
EOSINOPHIL #: 0.1 x10ˆ3/uL (ref 0.00–0.50)
EOSINOPHIL %: 1 % (ref 0–5)
HCT: 49.1 % (ref 40.0–50.0)
HCT: 49.1 % (ref 40.0–50.0)
HGB: 16.1 g/dL (ref 13.5–18.0)
LYMPHOCYTE #: 1.3 x10ˆ3/uL (ref 1.00–4.80)
LYMPHOCYTE %: 14 % — ABNORMAL LOW (ref 15–43)
MCH: 31.3 pg (ref 27.5–33.2)
MCHC: 32.8 g/dL (ref 32.0–36.0)
MCV: 95.3 fL (ref 82.0–97.0)
MONOCYTE #: 0.7 10*3/uL (ref 0.20–0.90)
MONOCYTE #: 0.7 x10ˆ3/uL (ref 0.20–0.90)
MONOCYTE %: 8 % (ref 5–12)
MPV: 9.5 fL (ref 7.4–10.5)
NEUTROPHIL #: 7.1 x10ˆ3/uL — ABNORMAL HIGH (ref 1.50–6.50)
NEUTROPHIL %: 76 % (ref 43–76)
NEUTROPHIL %: 76 % (ref 43–76)
PLATELETS: 168 x10ˆ3/uL (ref 150–450)
RBC: 5.15 x10ˆ6/uL (ref 4.40–5.80)
RDW: 14.9 % (ref 11.0–16.0)
WBC: 9.3 x10ˆ3/uL (ref 4.0–11.0)

## 2016-12-10 LAB — BASIC METABOLIC PANEL
ANION GAP: 7 mmol/L (ref 3–11)
BUN/CREA RATIO: 25 — ABNORMAL HIGH (ref 6–22)
BUN: 30 mg/dL — ABNORMAL HIGH (ref 6–20)
CALCIUM: 9.5 mg/dL (ref 8.8–10.2)
CHLORIDE: 102 mmol/L (ref 101–111)
CO2 TOTAL: 30 mmol/L (ref 22–32)
CREATININE: 1.2 mg/dL (ref 0.61–1.24)
ESTIMATED GFR: >60 mL/min/1.73mˆ2 (ref >60)
GLUCOSE: 114 mg/dL — ABNORMAL HIGH (ref 70–110)
SODIUM: 139 mmol/L (ref 136–145)

## 2016-12-10 NOTE — Progress Notes (Signed)
[facility]  [place] [address]  Dept: [phone]  Dept Fax: [phone]  Loc: [phone]  Loc Fax: [phone]    Patient: [name]  MRN: [id number]  Date of birth: 1951-01-07  Gender: @GENDER @  Date of service: 12/13/16    Chief Complaint:   Stress and Disorientation/Confusion    Functional Health Screen        Vital Signs  BP 116/74   Pulse 74   Temp 36.9 C (98.4 F) (Oral)    Resp 14   Ht 1.829 m (6')   Wt 111.4 kg (245 lb 8 oz)   BMI 33.3 kg/m2  History   Smoking Status    Current Every Day Smoker    Packs/day: 1.00    Last attempt to quit: 06/01/2013   Smokeless Tobacco    Never Used     Patient Health Rating                          Allergies:  Allergies   Allergen Reactions    Morphine  Other Adverse Reaction (Add comment)     combative     Medication History  MEDICATIONSREVIEWED  Care Team  Patient Care Team:  [name], DO as PCP - General ([facility])  Oran Rein, MD as PCP - Cardiologist (EXTERNAL)  [name], DO as PCP - Claims Attributed    My clinic staff completed this information and I reviewed the information. I agree with the information.     [name], MD  12/10/2016, 13:41    HPI: Stress and Disorientation/Confusion  .  Here as a new patient to me ususally sees Dr Darliss Cheney.  States he had a seizure 2 nights ago.  The seizure was not witnessed he cannot remember what happened.  does not know if he sees a neurologist or not.  Still feels disoreinted cannot tell me how longer the seizure lasted taking the medications as directed and has not missed a dose. Dx with temporal lobe seizues admits to not sleeping well and has beeen sleep depirved for 2 days then had the seizure.  No new supplements.  He is a poor hsitorian and cannot answer many of my questions in regards to what happened.  Has not had any other episodes since the one 2 nights ago.    Past Medical History  Current Outpatient  Prescriptions   Medication Sig    escitalopram oxalate (LEXAPRO) 20 mg Oral Tablet Take 1 Tab (20 mg total) by mouth Once a day    levETIRAcetam (KEPPRA) 1,000 mg Oral Tablet Take 1 Tab (1,000 mg total) by mouth Twice daily    OXcarbazepine (TRILEPTAL) 600 mg Oral Tablet Take 1 Tab (600 mg total) by mouth One time for 1 dose    tamsulosin (FLOMAX) 0.4 mg Oral Capsule, Sust. Release 24 hr Take 1 Cap (0.4 mg total) by mouth Every evening after dinner    traZODone (DESYREL) 50 mg Oral Tablet TAKE 2 TABS (100 MG TOTAL) BY MOUTH EVERY NIGHT AS NEEDED FOR INSOMNIA    warfarin (COUMADIN) 5 mg Oral Tablet Take 1 Tab (5 mg total) by mouth Every Monday, Wednesday and Friday    warfarin (COUMADIN) 7.5 mg Oral Tablet Take 1 Tab (7.5 mg total) by mouth Every Sunday, Tuesday, Thursday and Saturday     Allergies   Allergen Reactions    Morphine  Other Adverse Reaction (Add comment)     combative     Past Medical History:   Diagnosis Date    Abnormal EKG     Anxiety     Aortic valve replaced     Bladder stone     Depression     Diverticulitis     Kidney stones     Rectal bleeding     Seizures     Viral hepatitis C     Wears glasses          Past Surgical History:   Procedure Laterality Date    HX AORTIC VALVE REPLACEMENT      HX CHOLECYSTECTOMY      HX COLECTOMY  2010    at [facility] for diverticulitis.    HX COLONOSCOPY      HX KNEE REPLACMENT Left          Family Medical History     Problem Relation (Age of Onset)    COPD Mother    Heart Disease Father    Heart Surgery Father    Prostate Cancer Father (76)            Social History     Social History    Marital status: Married     Spouse name: Olin Hauser    Number of children: 2    Years of education: 69     Occupational History    disabled [employer]     Social History Main Topics    Smoking status: Current Every Day Smoker     Packs/day: 1.00     Last attempt to quit: 06/01/2013    Smokeless tobacco: Never Used    Alcohol use No    Drug use: No     Sexual activity: Yes     Partners: Female     Other Topics Concern    Ability To Walk 1 Flight Of Steps Without Sob/Cp Yes    Ability To Walk 2 Flight Of Steps Without Sob/Cp Yes    Ability To Do Own Adl's Yes     Social History Narrative       BP 116/74   Pulse 74   Temp 36.9 C (98.4 F) (Oral)    Resp 14   Ht 1.829 m (6')   Wt 111.4 kg (245 lb 8 oz)   BMI 33.3 kg/m2  Review of Systems -     Constitutional: negative for fevers, chills and weight loss  Eyes: negative for visual disturbance  Ears, nose, mouth, throat, and face: negative for hearing loss, earaches and hoarseness  Respiratory: negative for cough or dyspnea on exertion  Cardiovascular: negative for chest pressure/discomfort, palpitations, near-syncope, syncope  Gastrointestinal: negative for dysphagia, nausea, vomiting, diarrhea, constipation, abdominal pain and blood in bowel movements  Genitourinary:  negative for frequency, dysuria and hematuria.  The patient denies urinary hesitancy, nocturia, frequency or incomplete voiding.  Musculoskeletal:negative for myalgias, arthralgias and muscle weakness  Neurological: negative for  stroke  or TIA symptoms  Endocrine: negative for diabetic symptoms including polyuria, polydipsia and weight loss      EXAM:  General Vitals: BP 116/74   Pulse 74   Temp 36.9 C (98.4 F) (Oral)    Resp 14   Ht 1.829 m (6')   Wt 111.4 kg (245 lb 8 oz)   BMI 33.3 kg/m2  General: appears chronically ill, appears older than stated age and no distress  Eyes: Pupils equal and round, reactive to light and accomodation. ,  Conjunctivae/corneas clear, PERRLA, EOM's intact.   HENT:Left TM and canal normal. , Right TM and canal normal. , Nose without erythema. , Mouth mucous membranes moist , Pharynx without injection or exudate. , No oral lesions.   Neck: No JVD or thyromegaly or lymphadenopathy and thyroid: not enlarged, symmetric, no tenderness/mass/nodules  Lungs: clear to auscultation bilaterally.   Cardiovascular:    Heart  regular rate and rhythm, S1, S2 normal, no murmur, click, rub or gallop  Extremities: no edema, redness or tenderness in the calves , homans sign is negative, no sign of DVT,   Neurologic: gait is normal, CN II - XII grossly intact  and alert and oriented x3  Lymphatics: no lymphadenopathy  Psychiatric: affect normal, behavior normal, thought content normal, judgement normal           BMI addressed: Advised on diet, weight loss, and exercise to reduce above normal BMI.  Tobacco cessation counseling performed.           Assessment and Plan:     ICD-10-CM    1. Epilepsy (CMS [place]) G40.909 OXCARBAZEPINE METABOLITE ([facility]), SERUM     CBC/DIFF     BASIC METABOLIC PANEL     Refer to [facility] Neurology   1.    Epilepsy: Will check labs will continues current medications discussed seizure precautions and not to drive will also refer him to Neurology.  He will follow up with his PCP in 2 weeks.   Discussed symptoms to monitor for when to go to the emergency room. Await the labs    Return if symptoms worsen or fail to improve.    [name], MD  12/10/2016, 13:41

## 2016-12-10 NOTE — Nursing Note (Signed)
Chief Complaint:   Stress and Disorientation/Confusion    Functional Health Screen        Vital Signs  BP 116/74  Pulse 74  Temp 36.9 C (98.4 F) (Oral)   Resp 14  Ht 1.829 m (6')  Wt 111.4 kg (245 lb 8 oz)  BMI 33.3 kg/m2  History   Smoking Status   . Current Every Day Smoker   . Packs/day: 1.00   . Last attempt to quit: 06/01/2013   Smokeless Tobacco   . Never Used     Allergies  Allergies   Allergen Reactions   . Morphine  Other Adverse Reaction (Add comment)     combative     Medication History  Reviewed for OTC medication and any new medications, provider will review medication history  Care Team  Patient Care Team:  [name], DO as PCP - General [facility] [place])  Oran Rein, MD as PCP - Cardiologist (EXTERNAL)  [name], DO as PCP - Claims Attributed  Immunizations - last 24 hours     None        There are no exam notes on file for this visit.  Marthenia Rolling, [place]  12/10/2016, 13:37

## 2016-12-12 LAB — OXCARBAZEPINE METABOLITE (MHC), SERUM: OXCARBAZEPINE METABOLITE (MHC), SERUM: <1 ug/mL — ABNORMAL LOW (ref 3–35)

## 2016-12-31 DIAGNOSIS — Z23 Encounter for immunization: Secondary | ICD-10-CM | POA: Diagnosis not present

## 2017-01-01 ENCOUNTER — Telehealth (INDEPENDENT_AMBULATORY_CARE_PROVIDER_SITE_OTHER): Payer: Self-pay

## 2017-01-01 NOTE — Progress Notes (Signed)
I called and spoke with patient.  He reports that he is taking Keppra and Tegretol for the seizures.  Inquired if he meant Trileptal and he said "sure, that sounds good".  Patient advised to follow up with PCP.  Appt made for 02/19/17 with Dr. Darliss Cheney.  Patient encouraged to call with any questions.    Desiree Hane, LPN  11/14/2177, 81:02

## 2017-02-19 ENCOUNTER — Encounter (INDEPENDENT_AMBULATORY_CARE_PROVIDER_SITE_OTHER): Payer: Self-pay

## 2017-03-01 ENCOUNTER — Other Ambulatory Visit: Payer: Self-pay

## 2017-03-17 ENCOUNTER — Encounter (INDEPENDENT_AMBULATORY_CARE_PROVIDER_SITE_OTHER): Payer: Self-pay

## 2017-03-26 ENCOUNTER — Encounter (INDEPENDENT_AMBULATORY_CARE_PROVIDER_SITE_OTHER): Payer: Self-pay

## 2017-04-14 ENCOUNTER — Other Ambulatory Visit (INDEPENDENT_AMBULATORY_CARE_PROVIDER_SITE_OTHER): Payer: Self-pay

## 2017-04-14 MED ORDER — ESCITALOPRAM 20 MG TABLET
20.0000 mg | ORAL_TABLET | Freq: Every day | ORAL | 2 refills | Status: DC
Start: 2017-04-14 — End: 2017-05-01

## 2017-04-18 ENCOUNTER — Encounter (INDEPENDENT_AMBULATORY_CARE_PROVIDER_SITE_OTHER): Payer: Self-pay | Admitting: Family Medicine

## 2017-04-20 ENCOUNTER — Other Ambulatory Visit (INDEPENDENT_AMBULATORY_CARE_PROVIDER_SITE_OTHER): Payer: Self-pay

## 2017-04-22 ENCOUNTER — Ambulatory Visit (INDEPENDENT_AMBULATORY_CARE_PROVIDER_SITE_OTHER): Payer: Medicare Other | Admitting: Family Medicine

## 2017-04-22 ENCOUNTER — Encounter (INDEPENDENT_AMBULATORY_CARE_PROVIDER_SITE_OTHER): Payer: Self-pay

## 2017-04-22 ENCOUNTER — Encounter (INDEPENDENT_AMBULATORY_CARE_PROVIDER_SITE_OTHER): Payer: Self-pay | Admitting: Family Medicine

## 2017-04-22 VITALS — BP 118/82 | HR 100 | Temp 98.4°F | Resp 18 | Ht 72.0 in | Wt 261.4 lb

## 2017-04-22 DIAGNOSIS — F99 Mental disorder, not otherwise specified: Secondary | ICD-10-CM | POA: Diagnosis not present

## 2017-04-22 DIAGNOSIS — Z716 Tobacco abuse counseling: Secondary | ICD-10-CM | POA: Diagnosis not present

## 2017-04-22 DIAGNOSIS — Z952 Presence of prosthetic heart valve: Secondary | ICD-10-CM | POA: Diagnosis not present

## 2017-04-22 DIAGNOSIS — Z0001 Encounter for general adult medical examination with abnormal findings: Secondary | ICD-10-CM | POA: Diagnosis not present

## 2017-04-22 DIAGNOSIS — Z23 Encounter for immunization: Secondary | ICD-10-CM | POA: Diagnosis not present

## 2017-04-22 DIAGNOSIS — I25118 Atherosclerotic heart disease of native coronary artery with other forms of angina pectoris: Secondary | ICD-10-CM | POA: Diagnosis not present

## 2017-04-22 DIAGNOSIS — Z7189 Other specified counseling: Secondary | ICD-10-CM | POA: Diagnosis not present

## 2017-04-22 DIAGNOSIS — Z7901 Long term (current) use of anticoagulants: Secondary | ICD-10-CM | POA: Diagnosis not present

## 2017-04-22 DIAGNOSIS — F17210 Nicotine dependence, cigarettes, uncomplicated: Secondary | ICD-10-CM | POA: Diagnosis not present

## 2017-04-22 DIAGNOSIS — G4733 Obstructive sleep apnea (adult) (pediatric): Secondary | ICD-10-CM | POA: Diagnosis not present

## 2017-04-22 DIAGNOSIS — F5105 Insomnia due to other mental disorder: Secondary | ICD-10-CM | POA: Diagnosis not present

## 2017-04-22 DIAGNOSIS — Z7185 Encounter for immunization safety counseling: Secondary | ICD-10-CM

## 2017-04-22 DIAGNOSIS — Z0000 Encounter for general adult medical examination without abnormal findings: Secondary | ICD-10-CM

## 2017-04-22 LAB — POCT EAST PROTHROMBIN TIME (AMB): INR: 2

## 2017-04-22 MED ORDER — VARICELLA-ZOSTER GLYCOE VACC-AS01B ADJ(PF) 50 MCG/0.5 ML IM SUSP, KIT
0.5000 mL | INHALATION_SUSPENSION | Freq: Once | INTRAMUSCULAR | 0 refills | Status: AC
Start: 2017-04-22 — End: 2017-04-22

## 2017-04-22 MED ORDER — RANITIDINE 150 MG TABLET
150.00 mg | ORAL_TABLET | Freq: Two times a day (BID) | ORAL | 1 refills | Status: DC
Start: 2017-04-22 — End: 2017-05-01

## 2017-04-22 MED ORDER — TRAZODONE 100 MG TABLET
100.00 mg | ORAL_TABLET | Freq: Every evening | ORAL | 1 refills | Status: DC
Start: 2017-04-22 — End: 2017-05-01

## 2017-04-22 MED ORDER — TRAZODONE 100 MG TABLET
ORAL_TABLET | ORAL | 1 refills | Status: DC
Start: 2017-04-22 — End: 2017-04-22

## 2017-04-22 NOTE — Nursing Note (Signed)
04/22/17 1500   Medicare Wellness Assessment   Medicare initial or wellness physical in the last year? No   Advance Directives   Does patient have a living will or MPOA no   Has patient provided [employer] with a copy? no   Advance directive information given to the patient today? YES   Activities of Daily Living   Do you need help with dressing, bathing, or walking? No   Do you need help with shopping, housekeeping, medications, or finances? No   Do you have rugs in hallways, broken steps, or poor lighting? No   Do you have grab bars in your bathroom, non-slip strips in your tub, and hand rails on your stairs? No   Cognitive Function Screen   What is you age? 1   What is the time to the nearest hour? 1   What is the year? 1   What is the name of this clinic? 1   Can the patient recognize two persons (the doctor, the nurse, home help, etc.)? 1   What is the date of your birth? (day and month sufficient)  1   In what year did World War II end? 0   Who is the current president of the [place]? 1   Count from 20 down to 1? 1   What address did I give you earlier? 1   Total Score 9   Depression Screen   Little interest or pleasure in doing things. 2   Feeling down, depressed, or hopeless 0   PHQ 2 Total 2   Hearing Screen   Have you noticed any hearing difficulties? 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 1   After whispering 4-7-8 how many numbers did the patient repeat correctly? 1   Total Correct 2   Fall Risk Assessment   Do you feel unsteady when standing or walking? No   Do you worry about falling? No   Have you fallen in the past year? No   Vision Screen   Right Eye = 20 50   Left Eye = 20 50

## 2017-04-22 NOTE — Progress Notes (Signed)
[place]  [place]  [place] [address]  Dept: [phone]  Dept Fax: [phone]  Loc: [phone]  Loc Fax: [phone]    Patient: [name]  MRN: [id number]  Date of birth: [birth year]  Gender: male  Date of service: 04/22/17    Chief Complaint:   Sleep Problem and Medicare Annual    Functional Health Screen        Vital Signs  BP 118/82   Pulse 100   Temp 36.9 C (98.4 F) (Oral)   Resp 18   Ht 1.829 m (6')   Wt 118.6 kg (261 lb 6.4 oz)   BMI 35.45 kg/m       Social History     Tobacco Use   Smoking Status Current Every Day Smoker   . Packs/day: 1.00   . Last attempt to quit: 06/01/2013   . Years since quitting: 3.8   Smokeless Tobacco Never Used     Patient Health Rating                          Allergies:  Allergies   Allergen Reactions   . Morphine  Other Adverse Reaction (Add comment)     combative     Medication History  [place] Team  Patient Care Team:  [name], DO as PCP - General ([facility] [place])  Oran Rein, MD as PCP - Cardiologist (EXTERNAL)  [name], DO as PCP - Claims Attributed    My clinic staff completed this information and I reviewed the information. I agree with the information.     [name], MD  04/22/2017, 14:41    HPI: Sleep Problem and Medicare Annual  .    67 year old male here who is here for complaint of insomnia.  He also has major depressive disorder with anxiety as as seizures.  He is normally followed by another provider in the office I saw him in August after her hospital follow-up for seizure activity.  He also is on Coumadin for mechanical heart valve and has not seen a heart doctor in some time had Coumadin checked in over 6 months.  INR today is 2.0  Never saw nuerology due to not being able to find the office.  Had a sleep study in hte past  Which showed OSA and started on cpap which was over 10 yrs ago does snore.  Cannot fall asleep and will ly awake for over 5  hrs.  Had surgery for the seizures and the sleep issues have been worse since the surgery.  Daughter concerned about the depression.  His spouse recently left him 6 months ago.  Last seizure he believes was 6 months ago .  Daughter has witnessed some size of possible seizure activity to include pet mal sz activity.  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  Insomnia tried OTC medication and trazodone up to 200 mg  With no help of the sleep issues and at 200 mg had side effects of headache.  Per his daughter he sleeps most of the day when visiting her in [place] and cannot tell her what the issues are and poor historiain.    Past Medical History  Current Outpatient Medications   Medication Sig   . escitalopram oxalate (LEXAPRO) 20 mg Oral Tablet Take 1 Tab (20 mg total) by mouth Once a day   . levETIRAcetam (  KEPPRA) 1,000 mg Oral Tablet Take 1 Tab (1,000 mg total) by mouth Twice daily   . raNITIdine (ZANTAC) 150 mg Oral Tablet Take 1 Tab (150 mg total) by mouth Twice daily   . tamsulosin (FLOMAX) 0.4 mg Oral Capsule, Sust. Release 24 hr Take 1 Cap (0.4 mg total) by mouth Every evening after dinner   . traZODone (DESYREL) 100 mg Oral Tablet Take 1 Tab (100 mg total) by mouth Every night   . varicella-zoster, PF, (SHINGRIX, PF,) 50 mcg/0.5 mL Intramuscular Suspension for Reconstitution 0.5 mL by Intramuscular route One time for 1 dose   . warfarin (COUMADIN) 5 mg Oral Tablet Take 1 Tab (5 mg total) by mouth Every Monday, Wednesday and Friday   . warfarin (COUMADIN) 7.5 mg Oral Tablet Take 1 Tab (7.5 mg total) by mouth Every Sunday, Tuesday, Thursday and Saturday     Allergies   Allergen Reactions   . Morphine  Other Adverse Reaction (Add comment)     combative     Past Medical History:   Diagnosis Date   . Abnormal EKG    . Anxiety    . Aortic valve replaced    . Bladder stone    . Depression    . Diverticulitis    . Kidney stones    . Rectal bleeding    . Seizures    . Viral hepatitis C    . Wears glasses            Past Surgical History:   Procedure Laterality Date   . HX AORTIC VALVE REPLACEMENT     . HX CHOLECYSTECTOMY     . HX COLECTOMY  2010    at [facility] for diverticulitis.   Marland Kitchen HX COLONOSCOPY     . HX KNEE REPLACMENT Left          Family Medical History:     Problem Relation (Age of Onset)    COPD Mother    Heart Disease Father    Heart Surgery Father    Prostate Cancer Father (57)            Social History     Socioeconomic History   . Marital status: Married     Spouse name: Olin Hauser   . Number of children: 2   . Years of education: 29   . Highest education level: Not on file   Social Needs   . Financial resource strain: Not on file   . Food insecurity - worry: Not on file   . Food insecurity - inability: Not on file   . Transportation needs - medical: Not on file   . Transportation needs - non-medical: Not on file   Occupational History   . Occupation: disabled     Employer: [employer]   Tobacco Use   . Smoking status: Current Every Day Smoker     Packs/day: 1.00     Last attempt to quit: 06/01/2013     Years since quitting: 3.8   . Smokeless tobacco: Never Used   Substance and Sexual Activity   . Alcohol use: No     Alcohol/week: 0.0 oz   . Drug use: No   . Sexual activity: Yes     Partners: Female   Other Topics Concern   . Ability to Walk 1 Flight of Steps without SOB/CP Yes   . Routine Exercise Not Asked   . Ability to Walk 2 Flight of Steps without SOB/CP Yes   . Unable to  Ambulate Not Asked   . Total Care Not Asked   . Ability To Do Own ADL's Yes   . Uses Walker Not Asked   . Other Activity Level Not Asked   . Uses Cane Not Asked   Social History Narrative   . Not on file       BP 118/82   Pulse 100   Temp 36.9 C (98.4 F) (Oral)   Resp 18   Ht 1.829 m (6')   Wt 118.6 kg (261 lb 6.4 oz)   BMI 35.45 kg/m       Review of Systems -     Constitutional: negative for fevers, chills and weight loss  Eyes: negative for visual disturbance  Ears, nose, mouth, throat, and face: negative for hearing loss,  earaches and hoarseness  Respiratory: negative for cough or dyspnea on exertion  Cardiovascular: negative for chest pressure/discomfort, palpitations, near-syncope, syncope  Gastrointestinal: negative for dysphagia, nausea, vomiting, diarrhea, constipation, abdominal pain and blood in bowel movements  Genitourinary:  negative for frequency, dysuria and hematuria.  The patient denies urinary hesitancy, nocturia, frequency or incomplete voiding.   Integument/breast: negative for rash and changed mole  Hematologic/lymphatic: negative for lymphadenopathy  Musculoskeletal:negative for myalgias, arthralgias and muscle weakness  Neurological: see HPI  Behavioral/Psych:  PHQ2 completed  Fall Risk: assessment completed and at moderate risk of falls  Endocrine: negative for diabetic symptoms including polyuria, polydipsia and weight loss      EXAM:  General Vitals: BP 118/82   Pulse 100   Temp 36.9 C (98.4 F) (Oral)   Resp 18   Ht 1.829 m (6')   Wt 118.6 kg (261 lb 6.4 oz)   BMI 35.45 kg/m       General: appears chronically ill, moderately obese and no distress  Eyes: Pupils equal and round, reactive to light and accomodation. , Conjunctivae/corneas clear, PERRLA, EOM's intact. Marland Kitchen  HENT: Mouth mucous membranes moist , Pharynx without injection or exudate. , No oral lesions.   Neck: No JVD or thyromegaly or lymphadenopathy and thyroid: not enlarged, symmetric, no tenderness/mass/nodules  Lungs: clear to auscultation bilaterally.   Cardiovascular:    Heart regular rate and rhythm, S1, S2 normal, no murmur, click, rub or gallop  Abdomen: soft, non-tender, non-distended,  no masses and no hernias  Extremities: no edema, redness or tenderness in the calves , homans sign is negative, no sign of DVT,   Skin: Skin color, texture, turgor normal. No rashes or lesions  Neurologic: gait is normal, CN II - XII grossly intact  and alert and oriented x3  Lymphatics: no lymphadenopathy  Psychiatric: affect normal, behavior normal,  thought content normal, judgement normal     Tobacco cessation counseling performed.           Assessment and Plan:     ICD-10-CM    1. Insomnia due to other mental disorder F51.05 traZODone (DESYREL) 100 mg Oral Tablet    F99    2. Mechanical heart valve present Z95.2 Refer to [facility]iology[facility]     Refer to [facility] [place]   3. Coronary artery disease of native artery of native heart with stable angina pectoris (CMS HCC) I25.118 Refer to [facility]   4. Anticoagulation monitoring, INR range 2-3 Z79.01 POCT EAST PROTHROMBIN TIME (AMB)     Refer to [facility] [place]   5. OSA (obstructive sleep apnea) G47.33 POLYSOMNOGRAPHY - SLEEP STUDY - UNATTENDED   6. Immunization counseling  Z71.89 varicella-zoster, PF, (SHINGRIX, PF,) 50 mcg/0.5 mL Intramuscular Suspension for Reconstitution   7. Medicare annual wellness visit, subsequent Z00.00      1.    Insomnia due to other mental disorder well restart the trazodone having take that with melatonin as well as magnesium discussed sleep hygiene with him as well really want to wait on any other medications till after as the sleep study done.  2. Mechanical heart valve present/coronary artery disease in native artery of native heart with stable angina pectoralis will refer to Cardiology for transfer care no changes to his current medications.    3. Anticoagulation monitoring INR range 2 to 3 his INR was 2 will continue the warfarin at current dose and will have him start coming to the [facility] care at the office.  4. Obstructive sleep apnea will repeat sleep study and see if he can tolerated tolerate CPAP.  5. Given a prescription for the Shingrix vaccine. Received the prvnar 13      Answered all of the daughter's questions to the best of my ability reviewed his medications with him discussed the need for referrals and while they were needed as well as the need for follow-up with specialist they  verbalized understanding.  Will follow up with me in 6 weeks.  Return in about 6 weeks (around 06/03/2017), or if symptoms worsen or fail to improve.    [name], MD  04/22/2017, 14:41
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
## 2017-04-22 NOTE — Nursing Note (Signed)
04/22/17 1500   [facility] PT/INR   Date INR drawn 04/22/17   PT 24.5   INR 2.0   Initials CRM

## 2017-04-22 NOTE — Patient Instructions (Addendum)
Take trazodone 100 mg along with 5 mg melatonin and 400 mg magnesium oxide 30 mins prior to going to bed for sleep    Medicare Preventive Services  Medicare coverage information Recommendation for YOU   Heart Disease and Diabetes   Lipid profile every 5 years or more often if at risk for cardiovascular disease  Last Lipid Panel  (Last result in the past 2 years)      Cholesterol   HDL   LDL   Direct LDL   Triglycerides      06/17/16 1026 181 50 113   91         Diabetes Screening with Blood Glucose test or Glucose Tolerance Test  yearly for those at risk for diabetes, up to two tests per year for those with prediabetes  Last Glucose: 114     Diabetes Self-Management Training initial training ten hours per year, and follow-up training two hours per subsequent year. Optional for those with diabetes    Medical Nutrition Therapy three hours of one-on-one counseling in first year, two hours in subsequent years. Optional for those with diabetes, kidney disease   Intensive Behavioral Therapy for Obesity  Face-to-face counseling, first month every week, month 2-6 every other week, month 7-12 every month if continued progress is documented Optional for those with Body Mass Index 30 or higher  Your Body mass index is 35.45 kg/m.   Tobacco Cessation (Quitting) Counseling   Two attempts per year, max 4 sessions per attempt, up to 8 sessions per year Optional for those who use tobacco    Cancer Screening   Colorectal screening   for anyone age 73 to 36 or any age if high risk:  . Screening Colonoscopy every 10 years, more frequent if high risk  OR  . Flexible  Sigmoidoscopy  every 5 years OR  . Fecal Occult Blood Testing yearly OR  . Cologuard Stool DNA test once every 3 years OR  . CT Colonography every 5 years  See Your Schedule below   Prostate Cancer Screening  Prostate Specific Antigen and Digital Rectal Exam NOT routinely recommended  Medicare will still cover annually after age 34 if recommended by your provider         Lung Cancer Screening  Annual low dose computed tomography (LDCT scan) is recommended for those age 58-77 who smoked 30 pack-years and are current smokers or quit smoking within past 15 years (one pack-year= smoking one PPD for one year), after counseling by your doctor or nurse clinician about the possible benefits or harms. See Your Schedule below   Vaccinations   Pneumococcal Vaccine recommended routinely age 53+ with two separate vaccines one year apart (Prevnar then Pneumovax).  Recommended before age 19 if medical conditions increase risk  Seasonal Influenza Vaccine once every flu season   Hepatitis B Vaccine 3 doses if risk (including anyone with diabetes or liver disease)  Shingles Vaccine Once or twice age 66 or older  Diphtheria Tetanus Pertussis Vaccine ONCE as adult and a booster every 10 years      Immunization History   Administered Date(s) Administered   . DIPTH,PERTUSSIS-ACEL,TETANUS (ADACEL) >11 YRS OLD    (ADMIN) 09/29/2012   . Influenza Vaccine High Dose IM 12/31/2016   . Influenza Vaccine IM (ADMIN) 02/04/2014     Shingles vaccine and Diphtheria Tetanus Pertussis vaccines are available at pharmacies or local health department without a prescription.   Other Screening   Glaucoma Screening   yearly if in high  risk group such as diabetes, family history, African American age 29+ or Hispanic American age 61+    Hepatitis C Screening recommended ONCE for those born between 1945-1965, or high risk for HCV infection  See Your Schedule below   HIV Testing recommended routinely at least ONCE, covered every year for age 27 to 9 regardless of risk, and every year for age over 76 who ask for the test or higher risk See Your Schedule below  Abdominal Aortic Aneurysm Screening Ultrasound Recommended ONCE for any male who smoked 100 cigarettes/lifetime OR with family history of aortic aneurysm     Your Personalized Schedule for Preventive Tests   Health Maintenance: Pending and Last Completed       Date Due  Completion Date    Shingles Vaccine (1 of 2) 05/19/2000 ---    Annual Wellness Exam 04/23/2012 ---    Pneumococcal 65+ Years Low Risk (1 of 2 - PCV13) 05/20/2015 ---    AAA Screening 05/20/2015 ---    Depression Screening 09/17/2016 09/18/2015    Influenza Vaccine (1) 12/14/2016 02/04/2014    Adult Tdap-Td (2 - Td) 09/30/2022 09/29/2012

## 2017-04-22 NOTE — Progress Notes (Signed)
[name] [id number]    Medicare Annual Wellness Visit    Name: [name] MRN:  [id number]   Date: 04/22/2017 Age: 67 y.o.       SUBJECTIVE:   [name] is a 67 y.o. male for presenting for Medicare Wellness exam.   I have reviewed and reconciled the medication list with the patient today.    I have reviewed and updated as appropriate the past medical, family and social history. 04/22/2017 as summarized below:  Past Medical History:   Diagnosis Date   . Abnormal EKG    . Anxiety    . Aortic valve replaced    . Bladder stone    . Depression    . Diverticulitis    . Kidney stones    . Rectal bleeding    . Seizures    . Viral hepatitis C    . Wears glasses      Past Surgical History:   Procedure Laterality Date   . Hx aortic valve replacement     . Hx cholecystectomy     . Hx colectomy  2010   . Hx colonoscopy     . Hx knee replacment Left      Current Outpatient Medications   Medication Sig   . escitalopram oxalate (LEXAPRO) 20 mg Oral Tablet Take 1 Tab (20 mg total) by mouth Once a day   . levETIRAcetam (KEPPRA) 1,000 mg Oral Tablet Take 1 Tab (1,000 mg total) by mouth Twice daily   . raNITIdine (ZANTAC) 150 mg Oral Tablet Take 1 Tab (150 mg total) by mouth Twice daily   . tamsulosin (FLOMAX) 0.4 mg Oral Capsule, Sust. Release 24 hr Take 1 Cap (0.4 mg total) by mouth Every evening after dinner   . traZODone (DESYREL) 100 mg Oral Tablet TAKE 2 TABS (100 MG TOTAL) BY MOUTH EVERY NIGHT AS NEEDED FOR INSOMNIA   . warfarin (COUMADIN) 5 mg Oral Tablet Take 1 Tab (5 mg total) by mouth Every Monday, Wednesday and Friday   . warfarin (COUMADIN) 7.5 mg Oral Tablet Take 1 Tab (7.5 mg total) by mouth Every Sunday, Tuesday, Thursday and Saturday     Family Medical History:     Problem Relation (Age of Onset)    COPD Mother    Heart Disease Father    Heart Surgery Father    Prostate Cancer Father (36)            Social History     Socioeconomic History   . Marital status: Married      Spouse name: [name]   . Number of children: 2   . Years of education: 106   . Highest education level: Not on file   Social Needs   . Financial resource strain: Not on file   . Food insecurity - worry: Not on file   . Food insecurity - inability: Not on file   . Transportation needs - medical: Not on file   . Transportation needs - non-medical: Not on file   Occupational History   . Occupation: disabled     Employer: [employer]   Tobacco Use   . Smoking status: Current Every Day Smoker     Packs/day: 1.00     Last attempt to quit: 06/01/2013     Years since quitting: 3.8   . Smokeless tobacco: Never Used   Substance and Sexual Activity   . Alcohol use: No  Alcohol/week: 0.0 oz   . Drug use: No   . Sexual activity: Yes     Partners: Female   Other Topics Concern   . Ability to Walk 1 Flight of Steps without SOB/CP Yes   . Routine Exercise Not Asked   . Ability to Walk 2 Flight of Steps without SOB/CP Yes   . Unable to Ambulate Not Asked   . Total Care Not Asked   . Ability To Do Own ADL's Yes   . Uses Walker Not Asked   . Other Activity Level Not Asked   . Uses Cane Not Asked   Social History Narrative   . Not on file     Review of Systems: ROS:   Constitutional: negative for fevers, chills and weight loss  Eyes: negative for visual disturbance  Ears, nose, mouth, throat, and face: negative for hearing loss, earaches and hoarseness  Respiratory: negative for cough or dyspnea on exertion  Cardiovascular: negative for chest pressure/discomfort, palpitations, near-syncope, syncope  Gastrointestinal: negative for dysphagia, nausea, vomiting, diarrhea, constipation, abdominal pain and blood in bowel movements  Genitourinary:negative for frequency, dysuria and hematuria. The patient denies urinary hesitancy, nocturia, frequency or incomplete voiding.  Integument/breast: negative for rash and changed mole  Hematologic/lymphatic: negative for lymphadenopathy  Musculoskeletal : negative for muscle pain, joint pain or muscle  weakness  Neurological: negative for seizures and stroke or TIA symptoms  Behavioral/Psych:  PHQ2 done- See nursing questionairre  Endocrine: negative for diabetic symptoms including polyuria, polydipsia and weight loss  Allergic/Immunologic: negative       List of Current Health Care Providers   Care Team     PCP     Name Type Specialty Phone Number    [name], DO Physician [place] [phone]          Care Team     Name Type Specialty Phone Number    Oran Rein, MD Not available EXTERNAL [phone]    [name], DO Physician [place] [phone]                  Health Maintenance   Topic Date Due   . Shingles Vaccine (1 of 2) 05/19/2000   . Colonoscopy  05/19/2000   . Pneumococcal 65+ Years Low Risk (1 of 2 - PCV13) 05/20/2015   . AAA Screening  05/20/2015   . Depression Screening  04/22/2018   . Annual Wellness Exam  04/22/2018   . Adult Tdap-Td (2 - Td) 09/30/2022   . Hepatitis C screening antibody  Completed   . Influenza Vaccine  Completed     Medicare Wellness Assessment   Medicare initial or wellness physical in the last year?: No  Advance Directives (optional)   Does patient have a living will or MPOA: no   Has patient provided [employer] with a copy?: no   Advance directive information given to the patient today?: YES      Activities of Daily Living   Do you need help with dressing, bathing, or walking?: No   Do you need help with shopping, housekeeping, medications, or finances?: No   Do you have rugs in hallways, broken steps, or poor lighting?: No   Do you have grab bars in your bathroom, non-slip strips in your tub, and hand rails on your stairs?: No   Urinary Incontinence Screen (Women >=65 only)       Cognitive Function Screen (1=Yes, 0=No)   What is you age?:  Correct   What is the time to the nearest hour?: Correct   What is the year?: Correct   What is the name of this clinic?: Correct   Can the patient recognize two persons (the doctor, the  nurse, home help, etc.)?: Correct   What is the date of your birth? 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829 m (6')   Wt 118.6 kg (261 lb 6.4 oz)   BMI 35.45 kg/m        Other appropriate exam:    Health Maintenance Due   Topic Date Due   . Shingles Vaccine (1 of 2) 05/19/2000   . Colonoscopy  05/19/2000   . Pneumococcal 65+ Years Low Risk (1 of 2 - PCV13) 05/20/2015   . AAA Screening  05/20/2015      ASSESSMENT & PLAN:    Identified Risk Factors/ Recommended Actions   BMI addressed: Advised on diet, weight loss, and exercise to reduce above normal BMI.        (Z71.89) Immunization counseling  Plan: varicella-zoster, PF, (SHINGRIX, PF,) 50         mcg/0.5 mL Intramuscular Suspension for         Reconstitution    Prevnar 13 administered to L deltoid. Patient tolerated well. Educated to come back in one year for Pneumovax 23.             (Z00.00) Medicare annual wellness visit, subsequent  Plan: See below       Discussed smoking status. Patient has a 31 pack year hx. Educated on CT and AAA screening. Code given to daughter  to check with insurance to verify coverage for these screenings.               Orders Placed This Encounter   . Prevnar 13 (Admin)   . Refer to [facility]iology[facility]   . POCT EAST PROTHROMBIN TIME (AMB)   . HM [place] - UNATTENDED   . traZODone (DESYREL) 100 mg Oral Tablet   . raNITIdine (ZANTAC) 150 mg Oral Tablet          The patient has been educated about risk factors and recommended preventive care. Written Prevention Plan completed/ updated and given to patient (see After Visit  Summary).    Return in about 6 weeks (around 06/03/2017), or if symptoms worsen or fail to improve.     A shared visit was performed with the co-signing physician.    Veneda Melter, RN  [facility] OFFICE [facility]  [place]  [place] [id number]  [phone]

## 2017-05-01 ENCOUNTER — Other Ambulatory Visit (INDEPENDENT_AMBULATORY_CARE_PROVIDER_SITE_OTHER): Payer: Self-pay

## 2017-05-01 ENCOUNTER — Telehealth (INDEPENDENT_AMBULATORY_CARE_PROVIDER_SITE_OTHER): Payer: Self-pay

## 2017-05-01 DIAGNOSIS — F5105 Insomnia due to other mental disorder: Principal | ICD-10-CM

## 2017-05-01 DIAGNOSIS — F99 Mental disorder, not otherwise specified: Secondary | ICD-10-CM

## 2017-05-01 DIAGNOSIS — Z952 Presence of prosthetic heart valve: Secondary | ICD-10-CM

## 2017-05-01 MED ORDER — TAMSULOSIN 0.4 MG CAPSULE
0.40 mg | ORAL_CAPSULE | Freq: Every evening | ORAL | 3 refills | Status: AC
Start: 2017-05-01 — End: ?

## 2017-05-01 MED ORDER — RANITIDINE 150 MG TABLET
150.00 mg | ORAL_TABLET | Freq: Two times a day (BID) | ORAL | 3 refills | Status: AC
Start: 2017-05-01 — End: ?

## 2017-05-01 MED ORDER — WARFARIN 7.5 MG TABLET
7.50 mg | ORAL_TABLET | ORAL | 2 refills | Status: AC
Start: 2017-05-01 — End: ?

## 2017-05-01 MED ORDER — ESCITALOPRAM 20 MG TABLET
20.0000 mg | ORAL_TABLET | Freq: Every day | ORAL | 3 refills | Status: AC
Start: 2017-05-01 — End: ?

## 2017-05-01 MED ORDER — TRAZODONE 100 MG TABLET
100.00 mg | ORAL_TABLET | Freq: Every evening | ORAL | 3 refills | Status: DC
Start: 2017-05-01 — End: 2017-08-11

## 2017-05-01 MED ORDER — WARFARIN 5 MG TABLET
5.00 mg | ORAL_TABLET | ORAL | 2 refills | Status: AC
Start: 2017-05-02 — End: ?

## 2017-05-01 MED ORDER — LEVETIRACETAM 1,000 MG TABLET
1000.00 mg | ORAL_TABLET | Freq: Two times a day (BID) | ORAL | 3 refills | Status: AC
Start: 2017-05-01 — End: ?

## 2017-05-01 NOTE — Nursing Note (Signed)
patinet is requesting for these to be sent to mail order.    Desiree Hane, LPN  7/62/2633, 35:45

## 2017-05-01 NOTE — Telephone Encounter (Signed)
Confirmation needed, date and sign please fax.[name]  05/01/2017, 09:38

## 2017-05-02 ENCOUNTER — Ambulatory Visit (INDEPENDENT_AMBULATORY_CARE_PROVIDER_SITE_OTHER): Payer: Self-pay

## 2017-05-05 NOTE — Telephone Encounter (Signed)
Faxed and scanned into pt's chart.    [name], [place]  05/05/2017, 10:16

## 2017-05-08 ENCOUNTER — Telehealth (INDEPENDENT_AMBULATORY_CARE_PROVIDER_SITE_OTHER): Payer: Self-pay | Admitting: Family Medicine

## 2017-05-08 NOTE — Telephone Encounter (Signed)
Please sign and date and bring up front to be faxed please.  [name]  05/08/2017, 15:45

## 2017-05-09 ENCOUNTER — Ambulatory Visit (INDEPENDENT_AMBULATORY_CARE_PROVIDER_SITE_OTHER): Payer: Medicare Other

## 2017-05-09 DIAGNOSIS — Z720 Tobacco use: Secondary | ICD-10-CM | POA: Diagnosis not present

## 2017-05-09 DIAGNOSIS — Z952 Presence of prosthetic heart valve: Secondary | ICD-10-CM | POA: Diagnosis not present

## 2017-05-09 DIAGNOSIS — E8881 Metabolic syndrome: Secondary | ICD-10-CM | POA: Diagnosis not present

## 2017-05-09 DIAGNOSIS — Z7901 Long term (current) use of anticoagulants: Secondary | ICD-10-CM | POA: Diagnosis not present

## 2017-05-09 DIAGNOSIS — R7303 Prediabetes: Secondary | ICD-10-CM | POA: Diagnosis not present

## 2017-05-09 LAB — POCT EAST HGB A1C (AMB): POCT HGB A1C: 6.3 % — AB (ref 4–6)

## 2017-05-09 LAB — POCT EAST PROTHROMBIN TIME (AMB)
INR: 1.9
PROTHROMBIN TIME: 23.1

## 2017-05-09 NOTE — Telephone Encounter (Signed)
Form Faxed to [phone]  Sula Soda  05/09/2017, 15:03

## 2017-05-09 NOTE — Progress Notes (Addendum)
Subjective:     [name] is a 67 y.o. male here for warfarin management.      Indication for warfarin: aortic mech heart valve    Goal: 2.0-3.0   Current dose: 5 mg mwf, 7.5 mg all others  Duration:  Lifetime    Also complains of:      1.   Other medical issues include:  Patient Active Problem List   Diagnosis   . Aortic Mechanical heart valve present   . H/O diverticulitis of colon s/p partial colectomy   . Epilepsy ([place])   . Status post lobectomy of brain   . Tension headache, chronic   . Insomnia   . Joint pain   . Anticoagulation goal of INR 2 to 3   . OSA (obstructive sleep apnea)   . Osteoarthritis   . Elevated liver enzymes   . Fatty liver disease, nonalcoholic   . Obesity   . Tobacco abuse   . Folic acid deficiency anemia       Past Medical History:   Diagnosis Date   . Abnormal EKG    . Anxiety    . Aortic valve replaced    . Bladder stone    . Depression    . Diverticulitis    . Kidney stones    . Rectal bleeding    . Seizures    . Viral hepatitis C    . Wears glasses          Past Surgical History:   Procedure Laterality Date   . HX AORTIC VALVE REPLACEMENT     . HX CHOLECYSTECTOMY     . HX COLECTOMY  2010    at [facility] for diverticulitis.   Marland Kitchen HX COLONOSCOPY     . HX KNEE REPLACMENT Left          Social History     Socioeconomic History   . Marital status: Married     Spouse name: Olin Hauser   . Number of children: 2   . Years of education: 45   . Highest education level: Not on file   Social Needs   . Financial resource strain: Not on file   . Food insecurity - worry: Not on file   . Food insecurity - inability: Not on file   . Transportation needs - medical: Not on file   . Transportation needs - non-medical: Not on file   Occupational History   . Occupation: disabled     Employer: [employer]   Tobacco Use   . Smoking status: Current Every Day Smoker     Packs/day: 1.00     Last attempt to quit: 06/01/2013     Years since quitting: 3.9   . Smokeless tobacco: Never Used   Substance and Sexual  Activity   . Alcohol use: No     Alcohol/week: 0.0 oz   . Drug use: No   . Sexual activity: Yes     Partners: Female   Other Topics Concern   . Ability to Walk 1 Flight of Steps without SOB/CP Yes   . Routine Exercise Not Asked   . Ability to Walk 2 Flight of Steps without SOB/CP Yes   . Unable to Ambulate Not Asked   . Total Care Not Asked   . Ability To Do Own ADL's Yes   . Uses Walker Not Asked   . Other Activity Level Not Asked   . Uses Cane Not Asked   Social History Narrative   .  Not on file     Allergies   Allergen Reactions   . Morphine  Other Adverse Reaction (Add comment)     combative     Medications reviewed    Since the last visit has the patient experienced any of the following:    1. Unusual bruising bleeding   No  2. Nosebleeds    No  3. Blood in urine or change in color  No  4. Change in diet    No  (change intake of vitamin K rich foods)  5. Alcohol intake    No  6. Acute illness in past 10 days  No  (i.e. Fever, sore, throat, diarrhea)   7. Missed dose (per patient)   Did not take most recent dose  8. Falls or injuries    No  9. Symptoms of thromboembolic event No  10. Hospitalization/ER visit   No    Objective:     There were no vitals taken for this visit.         Labs: PT/INR = 23.1/1.9    Lab Results   Component Value Date    INR 1.9 05/09/2017               Assessment/Plan:       1. Anticoagulation   Dose:  Continue current dose  Education:  none    2. Due to history of prediabetes and recent concern for pain, patient and daughter request A1C. A1c= 6.3% today, prediabetes    Labs ordered:     Orders Placed This Encounter   . POCT EAST PROTHROMBIN TIME (AMB)   . POCT EAST HGB A1C (AMB)    cosigned by Dr. Erasmo Leventhal    Current Outpatient Medications   Medication Sig   . escitalopram oxalate (LEXAPRO) 20 mg Oral Tablet Take 1 Tab (20 mg total) by mouth Once a day   . levETIRAcetam (KEPPRA) 1,000 mg Oral Tablet Take 1 Tab (1,000 mg total) by mouth Twice daily   . raNITIdine (ZANTAC) 150 mg Oral Tablet  Take 1 Tab (150 mg total) by mouth Twice daily   . tamsulosin (FLOMAX) 0.4 mg Oral Capsule Take 1 Cap (0.4 mg total) by mouth Every evening after dinner   . traZODone (DESYREL) 100 mg Oral Tablet Take 1 Tab (100 mg total) by mouth Every night   . warfarin (COUMADIN) 5 mg Oral Tablet Take 1 Tab (5 mg total) by mouth Every Monday, Wednesday and Friday   . warfarin (COUMADIN) 7.5 mg Oral Tablet Take 1 Tab (7.5 mg total) by mouth Every Sunday, Tuesday, Thursday and Saturday       follow up in 4 weeks      Liliana Cline, [facility]  05/09/2017, 08:00

## 2017-05-09 NOTE — Telephone Encounter (Signed)
done

## 2017-05-13 ENCOUNTER — Ambulatory Visit (INDEPENDENT_AMBULATORY_CARE_PROVIDER_SITE_OTHER): Payer: Self-pay | Admitting: Neurology

## 2017-05-13 ENCOUNTER — Encounter (INDEPENDENT_AMBULATORY_CARE_PROVIDER_SITE_OTHER): Payer: Self-pay

## 2017-05-27 ENCOUNTER — Encounter (INDEPENDENT_AMBULATORY_CARE_PROVIDER_SITE_OTHER): Payer: Self-pay

## 2017-06-02 NOTE — H&P (Signed)
[address] [place] [address]  [phone]    Date: 06/03/2017  Patient Name: [name]  MRN#: [id number]  DOB: [birth year]    Provider: Clementeen Graham, MD  PCP: [name], DO      Reason for visit: Heart Disease    History:     [name] is a 67 y.o. male referred by Dr. [name] for a cardiac consultation on Valvular Heart Disease s/p AVR 2002. Today he is feeling well from a cardiac standpoint. He denies c/o any CP, SOB, palpitations, syncope/presyncope, orthopnea/PND, LE edema, or claudication. He is not very active given his knee pain. Admits retiring 2 years ago and has lived a sedentary lifestyle since. He is accompanied by his daughter who reports he is following a fatty liver diet. Denies any alcohol and recreational drug use. He currently smokes 1 PPD.    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 Since the procedure he has developed OSA, and will be picking a testing kit to monitor his sleep at home.    Past Medical History:     Past Medical History:   Diagnosis Date   . Abnormal EKG    . Anxiety    . Aortic valve replaced    . Bladder stone    . Depression    . Diverticulitis    . Kidney stones    . Rectal bleeding    . Seizures    . Viral hepatitis C    . Wears glasses          Past Surgical History:     Past Surgical History:   Procedure Laterality Date   . HX AORTIC VALVE REPLACEMENT     . HX CHOLECYSTECTOMY     . HX COLECTOMY  2010    at [facility] for diverticulitis.   Marland Kitchen HX COLONOSCOPY     . HX KNEE REPLACMENT Left          Allergies:     Allergies   Allergen Reactions   . Morphine  Other Adverse Reaction (Add comment)     combative       Medications:     Current Outpatient Medications   Medication Sig   . escitalopram oxalate (LEXAPRO) 20 mg Oral Tablet Take 1 Tab (20 mg total) by mouth Once a day   . levETIRAcetam (KEPPRA) 1,000 mg Oral Tablet Take 1 Tab (1,000 mg total) by mouth  Twice daily   . raNITIdine (ZANTAC) 150 mg Oral Tablet Take 1 Tab (150 mg total) by mouth Twice daily   . tamsulosin (FLOMAX) 0.4 mg Oral Capsule Take 1 Cap (0.4 mg total) by mouth Every evening after dinner   . traZODone (DESYREL) 100 mg Oral Tablet Take 1 Tab (100 mg total) by mouth Every night   . warfarin (COUMADIN) 5 mg Oral Tablet Take 1 Tab (5 mg total) by mouth Every Monday, Wednesday and Friday   . warfarin (COUMADIN) 7.5 mg Oral Tablet Take 1 Tab (7.5 mg total) by mouth Every Sunday, Tuesday, Thursday and Saturday     Family History:     Family Medical History:     Problem Relation (Age of Onset)    COPD Mother    Heart Disease Father    Heart Surgery Father    Prostate Cancer Father (46)            Social History:  Social History     Socioeconomic History   . Marital status: Married     Spouse name: Olin Hauser   . Number of children: 2   . Years of education: 24   . Highest education level: Not on file   Social Needs   . Financial resource strain: Not on file   . Food insecurity - worry: Not on file   . Food insecurity - inability: Not on file   . Transportation needs - medical: Not on file   . Transportation needs - non-medical: Not on file   Occupational History   . Occupation: disabled     Employer: [employer]   Tobacco Use   . Smoking status: Current Every Day Smoker     Packs/day: 1.00     Last attempt to quit: 06/01/2013     Years since quitting: 4.0   . Smokeless tobacco: Never Used   Substance and Sexual Activity   . Alcohol use: No     Alcohol/week: 0.0 oz   . Drug use: No   . Sexual activity: Yes     Partners: Female   Other Topics Concern   . Ability to Walk 1 Flight of Steps without SOB/CP Yes   . Routine Exercise Not Asked   . Ability to Walk 2 Flight of Steps without SOB/CP Yes   . Unable to Ambulate Not Asked   . Total Care Not Asked   . Ability To Do Own ADL's Yes   . Uses Walker Not Asked   . Other Activity Level Not Asked   . Uses Cane Not Asked   Social History Narrative   . Not on file          Review of Systems:     All systems were reviewed and are negative other than noted in the HPI.    Physical Exam:     Vitals:    06/03/17 1105   BP: (!) 140/86   Pulse: (!) 109   Resp: 16   SpO2: 97%   Weight: 121.1 kg (267 lb)     Body mass index is 36.21 kg/m.    Cardiovascular:  RRR  No murmur  No rubs, clicks, or gallops  Mechanical valve sound    Pulse exam:  2+ radial pulses  2+ DP pulses  2+ PT pulses    Extremities: Warm and well-perfused, trace LE edema    Neck: Supple, no JVD, no bruits, scarring in the RT temporal area following temporal lobectomy  General: Awake, alert, and in no acute distress  HEENT: Moist mucous membranes  Respiratory: Clear to auscultation  Abdominal/Gastrointestinal: Soft, non-tender, no masses, no organomegaly  Skin: Non jaundiced, no rashes  Neurological: Grossly nonfocal  Psychiatric: Normal affect    Cardiovascular Workup:     AVR 2002 at [facility] Aortic Valve    Echo 08/17/13  The left ventricular cavity is small.  There is mild asymmetric septal left ventricular hypertrophy.  The left ventricle is hyperdynamic with an EF greater than 70%.  No regional wall motion abnormalities noted.  Right ventricular systolic pressure is elevated at 30 to 40 mmHg.  Mitral inflow and tissue Doppler suggestive of grade 1 diastolic dysfunction.    Nuclear Stress Test 10/01/13  1. LV systolic function appears normal   2. Calculated LVEF 69%   3. No regional wall motion abnormalities.   4. Perfusion images show normal tracer uptake.   5. Low risk study.  Echo 08/16/15  1. Normal left ventricular systolic function with a visually estimated ejection fraction of 60%.    2. Mildly concentric left ventricular hypertrophy.  3. Functioning prosthetic aortic valve    EKG 06/03/17  Sinus tachycardia  Left axis deviation  Nonspecific T wave abnormality    Assessment:     [name] is a 67 y.o. male with    CAD: s/p NSTEMI and borderline troponin 2015  hospitalization - Nuclear stress 10/01/13 No regional wall motion abnormalities. Perfusion images show normal tracer uptake. Low risk study.  - denies CP and/or SOB    Valvular Heart Disease: s/p AVR 2002 [employer] Mechanical aortic valve. Echo 08/16/15 Functioning prosthetic aortic valve  - INR goal 3-3.5    Hx of seizures:  - had a temporal lobectomy    BP Status: elevated today    Lipid Status: FLP 06/17/16 total 181, trig 91, HDL 50, LDL 113    OSA:  - will be picking up a monitor soon    Current smoker: 1 PPD    Plan:     1. Will obtain an echo to reassess AV  2. Will obtain carotid duplex to assess for carotid stenosis  3. Will obtain AAA screening to r/o AAA  4. Start lipitor 10 mg daily  5. Start ASA 81 mg daily  6. Start Coreg 3.125 BID for better HR/BP control. Will plan to titrate up as tolerated  7. Continue current medications  8. Encouraged a heart healthy diet and regular exercise with weight loss  9. Encouraged to stay well hydrated  10. Strongly encouraged smoking cessation  11. Follow up after testing      Thank you for allowing me to participate in the care of your patient. Please feel free to contact me if there are further questions.     I am scribing for, and in the presence of, Dr. Clementeen Graham, MD for services provided on 06/03/2017.  Johnella Moloney, SCRIBE  Johnella Moloney, SCRIBE  06/03/2017, 11:18    I personally performed the services described in this documentation, as scribed in my presence, and it is both accurate and complete.    Clementeen Graham, MD    Clementeen Graham, MD  06/03/2017, 11:42

## 2017-06-03 ENCOUNTER — Encounter (HOSPITAL_BASED_OUTPATIENT_CLINIC_OR_DEPARTMENT_OTHER): Payer: Self-pay

## 2017-06-03 ENCOUNTER — Ambulatory Visit (INDEPENDENT_AMBULATORY_CARE_PROVIDER_SITE_OTHER): Payer: Medicare Other | Admitting: Cardiovascular Disease

## 2017-06-03 ENCOUNTER — Encounter (INDEPENDENT_AMBULATORY_CARE_PROVIDER_SITE_OTHER): Payer: Self-pay | Admitting: Family Medicine

## 2017-06-03 ENCOUNTER — Ambulatory Visit (INDEPENDENT_AMBULATORY_CARE_PROVIDER_SITE_OTHER): Payer: Medicare Other | Admitting: Family Medicine

## 2017-06-03 ENCOUNTER — Encounter (INDEPENDENT_AMBULATORY_CARE_PROVIDER_SITE_OTHER): Payer: Self-pay | Admitting: Cardiovascular Disease

## 2017-06-03 ENCOUNTER — Ambulatory Visit (HOSPITAL_BASED_OUTPATIENT_CLINIC_OR_DEPARTMENT_OTHER)
Admission: RE | Admit: 2017-06-03 | Discharge: 2017-06-03 | Disposition: A | Discharge status: Home / Self Care | Payer: Medicare Other | Source: Ambulatory Visit | Attending: Family Medicine | Admitting: Family Medicine

## 2017-06-03 VITALS — BP 124/68 | HR 108 | Temp 97.7°F | Resp 18 | Ht 72.0 in | Wt 268.0 lb

## 2017-06-03 VITALS — BP 140/86 | HR 109 | Resp 16 | Wt 267.0 lb

## 2017-06-03 DIAGNOSIS — R4689 Other symptoms and signs involving appearance and behavior: Secondary | ICD-10-CM | POA: Diagnosis not present

## 2017-06-03 DIAGNOSIS — Z720 Tobacco use: Secondary | ICD-10-CM | POA: Diagnosis not present

## 2017-06-03 DIAGNOSIS — F5101 Primary insomnia: Secondary | ICD-10-CM | POA: Diagnosis not present

## 2017-06-03 DIAGNOSIS — I25118 Atherosclerotic heart disease of native coronary artery with other forms of angina pectoris: Secondary | ICD-10-CM | POA: Diagnosis not present

## 2017-06-03 DIAGNOSIS — G4733 Obstructive sleep apnea (adult) (pediatric): Secondary | ICD-10-CM | POA: Diagnosis not present

## 2017-06-03 DIAGNOSIS — Z136 Encounter for screening for cardiovascular disorders: Secondary | ICD-10-CM | POA: Diagnosis not present

## 2017-06-03 DIAGNOSIS — Z952 Presence of prosthetic heart valve: Secondary | ICD-10-CM | POA: Diagnosis not present

## 2017-06-03 DIAGNOSIS — I6529 Occlusion and stenosis of unspecified carotid artery: Secondary | ICD-10-CM | POA: Diagnosis not present

## 2017-06-03 DIAGNOSIS — Z789 Other specified health status: Secondary | ICD-10-CM

## 2017-06-03 MED ORDER — ATORVASTATIN 10 MG TABLET
10.0000 mg | ORAL_TABLET | Freq: Every day | ORAL | 1 refills | Status: AC
Start: 2017-06-03 — End: ?

## 2017-06-03 MED ORDER — CARVEDILOL 3.125 MG TABLET
3.1250 mg | ORAL_TABLET | Freq: Two times a day (BID) | ORAL | 1 refills | Status: AC
Start: 2017-06-03 — End: ?

## 2017-06-03 NOTE — Ancillary Notes (Signed)
Neurodiagnostic Lab Note      [name] was educated on how to use the unattended sleep study device (Watch-PAT). Sent device home with patient to complete study tonight 06/03/2017 and told to return the device tomorrow.       Willy Eddy, [facility]  06/03/2017, 15:49
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
## 2017-06-04 NOTE — Progress Notes (Signed)
[name]  [place] [address]  Dept: [phone]  Dept Fax: [phone]  Loc: [phone]  Loc Fax: [phone]    Patient: [name]  MRN: [id number]  Date of birth: July 29, 1950  Gender: @GENDER @  Date of service: 06/04/17    Chief Complaint:   Sleep Problems (unable to fall asleep. x 4 years)    Functional Health Screen        Vital Signs  BP 124/68   Pulse (!) 108   Temp 36.5 C (97.7 F) (Oral)   Resp 18   Ht 1.829 m (6')   Wt 121.6 kg (268 lb)   BMI 36.35 kg/m       Social History     Tobacco Use   Smoking Status Current Every Day Smoker   . Packs/day: 1.00   . Last attempt to quit: 06/01/2013   . Years since quitting: 4.0   Smokeless Tobacco Never Used     Patient Health Rating                          Allergies:  Allergies   Allergen Reactions   . Morphine  Other Adverse Reaction (Add comment)     combative     Medication History  [facility] Team  Patient Care Team:  [name], DO as PCP - General ([facility] [place])  Oran Rein, MD as PCP - Cardiologist (EXTERNAL)  [name], DO as PCP - Claims Attributed    My clinic staff completed this information and I reviewed the information. I agree with the information.     [name], MD  06/04/2017, 11:10    HPI: Sleep Problems (unable to fall asleep. x 4 years)  .  67 yo male here for follow up accompanied by his daughter from [place].  He continues to have sleep issues and will pick up the unattended sleep testing equipment today.  Per daughter he has been staying up all night and then sleeping during the day neither the daughter or the patient can tell me when he takes the trazadone if at all at the reduced dose.  The daughter states he is missing his medications and is concerned about his compliance.  She is concerned that he  Should not be living at home alone and needs assistance with his medications, meals and ADLs.  He agrees that he forgets to take  the medication at times. He also agrees with needing help with his care.  The daughter feels he should move to [place] to live with her and her family or he needs assistant living. She does believe he needs to be placed in a nursing home.  He has spent extended time with her after his wife left him and the daughter and he agree he did better and felt better. Per daughter he has missed his warfarin doses and his seizure medication at times which concerns her the most.    Past Medical History  Current Outpatient Medications   Medication Sig   . aspirin (ASPIR-81 ORAL) Take by mouth   . atorvastatin (LIPITOR) 10 mg Oral Tablet Take 1 Tab (10 mg total) by mouth Once a day   . carvedilol (COREG) 3.125 mg Oral Tablet Take 1 Tab (3.125 mg total) by mouth Twice daily with food   . escitalopram oxalate (LEXAPRO) 20 mg Oral Tablet Take 1 Tab (20  mg total) by mouth Once a day   . levETIRAcetam (KEPPRA) 1,000 mg Oral Tablet Take 1 Tab (1,000 mg total) by mouth Twice daily   . raNITIdine (ZANTAC) 150 mg Oral Tablet Take 1 Tab (150 mg total) by mouth Twice daily   . tamsulosin (FLOMAX) 0.4 mg Oral Capsule Take 1 Cap (0.4 mg total) by mouth Every evening after dinner   . traZODone (DESYREL) 100 mg Oral Tablet Take 1 Tab (100 mg total) by mouth Every night   . warfarin (COUMADIN) 5 mg Oral Tablet Take 1 Tab (5 mg total) by mouth Every Monday, Wednesday and Friday   . warfarin (COUMADIN) 7.5 mg Oral Tablet Take 1 Tab (7.5 mg total) by mouth Every Sunday, Tuesday, Thursday and Saturday     Allergies   Allergen Reactions   . Morphine  Other Adverse Reaction (Add comment)     combative     Past Medical History:   Diagnosis Date   . Abnormal EKG    . Anxiety    . Aortic valve replaced    . Bladder stone    . Depression    . Diverticulitis    . Kidney stones    . Rectal bleeding    . Seizures    . Viral hepatitis C    . Wears glasses          Past Surgical History:   Procedure Laterality Date   . HX AORTIC VALVE REPLACEMENT     . HX  CHOLECYSTECTOMY     . HX COLECTOMY  2010    at [facility] for diverticulitis.   Marland Kitchen HX COLONOSCOPY     . HX KNEE REPLACMENT Left          Family Medical History:     Problem Relation (Age of Onset)    COPD Mother    Heart Disease Father    Heart Surgery Father    Prostate Cancer Father (57)            Social History     Socioeconomic History   . Marital status: Married     Spouse name: Olin Hauser   . Number of children: 2   . Years of education: 74   . Highest education level: Not on file   Social Needs   . Financial resource strain: Not on file   . Food insecurity - worry: Not on file   . Food insecurity - inability: Not on file   . Transportation needs - medical: Not on file   . Transportation needs - non-medical: Not on file   Occupational History   . Occupation: disabled     Employer: [employer]   Tobacco Use   . Smoking status: Current Every Day Smoker     Packs/day: 1.00     Last attempt to quit: 06/01/2013     Years since quitting: 4.0   . Smokeless tobacco: Never Used   Substance and Sexual Activity   . Alcohol use: No     Alcohol/week: 0.0 oz   . Drug use: No   . Sexual activity: Yes     Partners: Female   Other Topics Concern   . Ability to Walk 1 Flight of Steps without SOB/CP Yes   . Routine Exercise Not Asked   . Ability to Walk 2 Flight of Steps without SOB/CP Yes   . Unable to Ambulate Not Asked   . Total Care Not Asked   . Ability To Do Own ADL's  Yes   . Uses Walker Not Asked   . Other Activity Level Not Asked   . Uses Cane Not Asked   Social History Narrative   . Not on file       BP 124/68   Pulse (!) 108   Temp 36.5 C (97.7 F) (Oral)   Resp 18   Ht 1.829 m (6')   Wt 121.6 kg (268 lb)   BMI 36.35 kg/m       Review of Systems -     Constitutional: negative for fevers, chills and weight loss  Eyes: negative for visual disturbance  Ears, nose, mouth, throat, and face: negative for hearing loss, earaches and hoarseness  Respiratory: negative for cough or dyspnea on exertion  Cardiovascular:  negative for chest pressure/discomfort, palpitations, near-syncope, syncope  Gastrointestinal: negative for dysphagia, nausea, vomiting, diarrhea, constipation, abdominal pain and blood in bowel movements  Genitourinary:  negative for frequency, dysuria and hematuria.  The patient denies urinary hesitancy, nocturia, frequency or incomplete voiding.  Musculoskeletal:negative for myalgias, and muscle weakness  Neurological: negative for seizures and stroke  or TIA symptoms  Behavioral/Psych:  PHQ2 completed  Fall Risk: assessment completed and at moderate risk of falls  Endocrine: negative for diabetic symptoms including polyuria, polydipsia and weight loss  Allergic/Immunologic: negative    EXAM:  General Vitals: BP 124/68   Pulse (!) 108   Temp 36.5 C (97.7 F) (Oral)   Resp 18   Ht 1.829 m (6')   Wt 121.6 kg (268 lb)   BMI 36.35 kg/m       General: appears chronically ill, moderately obese, appears older than stated age and no distress  Eyes: Pupils equal and round, reactive to light and accomodation. , Conjunctivae/corneas clear, PERRLA, EOM's intact. Marland Kitchen  HENT: Nose without erythema. , Mouth mucous membranes moist , Pharynx without injection or exudate. , No oral lesions. Poor dentition  Neck: No JVD or thyromegaly or lymphadenopathy and thyroid: not enlarged, symmetric, no tenderness/mass/nodules  Lungs: clear to auscultation bilaterally.   Cardiovascular:    Heart regular rate and rhythm, S1, S2 normal, no murmur, click, rub or gallop  Abdomen: soft, non-tender, non-distended,  no masses and no hernias  Extremities: no edema, redness or tenderness in the calves   Neurologic: gait is slowed and limping on the left, CN II - XII grossly intact  and alert and oriented x3  Lymphatics: no lymphadenopathy  Psychiatric: affect normal, behavior cooperative      Assessment and Plan:     ICD-10-CM    1. Primary insomnia F51.01    2. OSA (obstructive sleep apnea) G47.33    3. Self-care deficit in patient living alone  R46.89    1.  Primary insomnia/OSA will await the sleep study test and continue the trazodone at the lower dose.  Discussed at length sleep hygiene  With both the daughter and the patient.  2.  Self care deficit in patient living alone:  Spent greater than 50% of the 30 min visit discussing options with the daughter and the patient about his needing assistance with his ADLs for better compliance.  She is going to see about having him relocate to [place] to live with her.  He was in agreement with this plan.    Return if symptoms worsen or fail to improve.    [name], MD  06/04/2017, 11:10

## 2017-06-12 NOTE — Procedures (Signed)
NAME:  [name] NUMBER:  [id number]  DATE OF SERVICE:  06/03/2017  DOB:  1951/01/05  SEX:  M      UNATTENDED POLYSOMNOGRAM REPORT    REFERRING PHYSICIAN:  [name], MD, and Lemont Fillers, DO    INDICATIONS FOR TESTING:  Snoring, excessive daytime sleepiness and observed apneas.    DESCRIPTION OF STUDY:  The patient was studied using the unattended [facility] system on June 03, 2017, for 10 hours and 5 minutes, 6 hours and 29 minutes of which were asleep.  The patient had a sleep onset of 6 minutes and a sleep efficiency of 64%.  Sleep architecture demonstrates 0 REM, 83% light and 17% deep sleep.  The patient had episodes of sleep-disordered breathing.  The Apnea-Hypopnea Index was 21.7.  The minimum oxygen saturation was 90%.    IMPRESSION:  1. Sleep-disordered breathing.   2. Obstructive sleep apnea, severe.    RECOMMENDATION:  CPAP titration study.        Merleen Milliner, MD          CC:   [name], DO   Fax: [phone]     [name], MD   [place], [place] [address]       DD:  06/12/2017 08:30:06  DT:  06/12/2017 08:48:32 KM  D#:  [id number]

## 2017-06-15 ENCOUNTER — Other Ambulatory Visit (INDEPENDENT_AMBULATORY_CARE_PROVIDER_SITE_OTHER): Payer: Self-pay | Admitting: Family Medicine

## 2017-06-15 DIAGNOSIS — G4733 Obstructive sleep apnea (adult) (pediatric): Secondary | ICD-10-CM

## 2017-06-16 NOTE — Progress Notes (Signed)
Left message for pt to callback regarding sleep study.  [name], [place]  06/16/2017, 11:07

## 2017-06-16 NOTE — Progress Notes (Signed)
Patient advised of sleep study results and of need of CPAP titration study.  Patient voiced understanding.    Desiree Hane, LPN  08/19/1535, 94:32

## 2017-06-17 ENCOUNTER — Ambulatory Visit (INDEPENDENT_AMBULATORY_CARE_PROVIDER_SITE_OTHER): Payer: Medicare Other

## 2017-06-17 DIAGNOSIS — Z952 Presence of prosthetic heart valve: Secondary | ICD-10-CM | POA: Diagnosis not present

## 2017-06-17 DIAGNOSIS — Z5181 Encounter for therapeutic drug level monitoring: Secondary | ICD-10-CM | POA: Diagnosis not present

## 2017-06-17 DIAGNOSIS — Z7901 Long term (current) use of anticoagulants: Secondary | ICD-10-CM | POA: Diagnosis not present

## 2017-06-17 DIAGNOSIS — F17210 Nicotine dependence, cigarettes, uncomplicated: Secondary | ICD-10-CM

## 2017-06-17 DIAGNOSIS — G4733 Obstructive sleep apnea (adult) (pediatric): Secondary | ICD-10-CM

## 2017-06-17 LAB — POCT EAST PROTHROMBIN TIME (AMB)
INR: 1.5
PROTHROMBIN TIME: 17.7

## 2017-06-17 NOTE — Progress Notes (Signed)
Subjective:     [name] is a 67 y.o. male here for warfarin management.      Indication for warfarin: aortic heart valve    Goal: 2.0-3.0   Current dose: 7.5 mg MWF, 5 mg all others  Duration:  Lifetime    Also complains of:      1. Patient complains of lack of sleep today. States he is exhausted and has a sleep study upcoming. Patient does not think much will come from the sleep study that he isn't already doing to improve sleep.    Other medical issues include:  Patient Active Problem List   Diagnosis   . Aortic Mechanical heart valve present   . H/O diverticulitis of colon s/p partial colectomy   . Epilepsy ([place])   . Status post lobectomy of brain   . Tension headache, chronic   . Insomnia   . Joint pain   . Anticoagulation goal of INR 2 to 3   . OSA (obstructive sleep apnea)   . Osteoarthritis   . Elevated liver enzymes   . Fatty liver disease, nonalcoholic   . Obesity   . Tobacco abuse   . Folic acid deficiency anemia       Past Medical History:   Diagnosis Date   . Abnormal EKG    . Anxiety    . Aortic valve replaced    . Bladder stone    . Depression    . Diverticulitis    . Kidney stones    . Rectal bleeding    . Seizures    . Viral hepatitis C    . Wears glasses          Past Surgical History:   Procedure Laterality Date   . HX AORTIC VALVE REPLACEMENT     . HX CHOLECYSTECTOMY     . HX COLECTOMY  2010    at [facility] for diverticulitis.   Marland Kitchen HX COLONOSCOPY     . HX KNEE REPLACMENT Left          Social History     Socioeconomic History   . Marital status: Married     Spouse name: Olin Hauser   . Number of children: 2   . Years of education: 11   . Highest education level: Not on file   Social Needs   . Financial resource strain: Not on file   . Food insecurity - worry: Not on file   . Food insecurity - inability: Not on file   . Transportation needs - medical: Not on file   . Transportation needs - non-medical: Not on file   Occupational History   . Occupation: disabled     Employer: [employer]   Tobacco  Use   . Smoking status: Current Every Day Smoker     Packs/day: 1.00     Last attempt to quit: 06/01/2013     Years since quitting: 4.0   . Smokeless tobacco: Never Used   Substance and Sexual Activity   . Alcohol use: No     Alcohol/week: 0.0 oz   . Drug use: No   . Sexual activity: Yes     Partners: Female   Other Topics Concern   . Ability to Walk 1 Flight of Steps without SOB/CP Yes   . Routine Exercise Not Asked   . Ability to Walk 2 Flight of Steps without SOB/CP Yes   . Unable to Ambulate Not Asked   . Total Care Not Asked   .  Ability To Do Own ADL's Yes   . Uses Walker Not Asked   . Other Activity Level Not Asked   . Uses Cane Not Asked   Social History Narrative   . Not on file     Allergies   Allergen Reactions   . Morphine  Other Adverse Reaction (Add comment)     combative     Medications reviewed    Since the last visit has the patient experienced any of the following:    1. Unusual bruising bleeding   No  2. Nosebleeds    No  3. Blood in urine or change in color  No  4. Change in diet    No  (change intake of vitamin K rich foods)  5. Alcohol intake    No  6. Acute illness in past 10 days  No  (i.e. Fever, sore, throat, diarrhea)   7. Missed dose (per patient)   No  8. Falls or injuries    No  9. Symptoms of thromboembolic event No  10. Hospitalization/ER visit   No    Objective:     There were no vitals taken for this visit.         Labs: PT/INR = 17.7/1.5        Assessment/Plan:       1. Anticoagulation   INR not therapeutic. Patient does seem to vary around this range 1.6-4.1, usually within 2-3 found on chart review while taking this dose. I suspect this is due to variable diet with patient not necessarily taking note of his eating habits.  Dose: Patient will double his dose tonight, Continue current dose  Education:  Explained to patient that variety in diet likely leading to variability. Patient to be more conscious of eating habits.    Labs ordered:     Orders Placed This Encounter   . POCT EAST  PROTHROMBIN TIME (AMB)    cosigned by Dr. Leonides Schanz    Current Outpatient Medications   Medication Sig   . aspirin (ASPIR-81 ORAL) Take by mouth   . atorvastatin (LIPITOR) 10 mg Oral Tablet Take 1 Tab (10 mg total) by mouth Once a day   . carvedilol (COREG) 3.125 mg Oral Tablet Take 1 Tab (3.125 mg total) by mouth Twice daily with food   . escitalopram oxalate (LEXAPRO) 20 mg Oral Tablet Take 1 Tab (20 mg total) by mouth Once a day   . levETIRAcetam (KEPPRA) 1,000 mg Oral Tablet Take 1 Tab (1,000 mg total) by mouth Twice daily   . raNITIdine (ZANTAC) 150 mg Oral Tablet Take 1 Tab (150 mg total) by mouth Twice daily   . tamsulosin (FLOMAX) 0.4 mg Oral Capsule Take 1 Cap (0.4 mg total) by mouth Every evening after dinner   . traZODone (DESYREL) 100 mg Oral Tablet Take 1 Tab (100 mg total) by mouth Every night   . warfarin (COUMADIN) 5 mg Oral Tablet Take 1 Tab (5 mg total) by mouth Every Monday, Wednesday and Friday   . warfarin (COUMADIN) 7.5 mg Oral Tablet Take 1 Tab (7.5 mg total) by mouth Every Sunday, Tuesday, Thursday and Saturday       follow up in 1 week, if out of range will need to adjust coumadin.      Liliana Cline, [facility]  06/17/2017, 14:06             I was present in the clinic at the time of the visit and available for any assistance in  the care of the patient.    Primus Bravo, MD  06/20/2017, 10:34

## 2017-06-24 ENCOUNTER — Encounter (INDEPENDENT_AMBULATORY_CARE_PROVIDER_SITE_OTHER): Payer: Self-pay | Admitting: Neurology

## 2017-06-24 ENCOUNTER — Ambulatory Visit (INDEPENDENT_AMBULATORY_CARE_PROVIDER_SITE_OTHER): Payer: Medicare Other | Admitting: Neurology

## 2017-06-24 VITALS — BP 146/90 | HR 88 | Ht 72.0 in | Wt 266.8 lb

## 2017-06-24 DIAGNOSIS — E538 Deficiency of other specified B group vitamins: Secondary | ICD-10-CM | POA: Diagnosis not present

## 2017-06-24 DIAGNOSIS — F17200 Nicotine dependence, unspecified, uncomplicated: Secondary | ICD-10-CM | POA: Diagnosis not present

## 2017-06-24 DIAGNOSIS — G40209 Localization-related (focal) (partial) symptomatic epilepsy and epileptic syndromes with complex partial seizures, not intractable, without status epilepticus: Secondary | ICD-10-CM | POA: Diagnosis not present

## 2017-06-24 DIAGNOSIS — Z9089 Acquired absence of other organs: Secondary | ICD-10-CM | POA: Diagnosis not present

## 2017-06-24 DIAGNOSIS — G4733 Obstructive sleep apnea (adult) (pediatric): Secondary | ICD-10-CM | POA: Diagnosis not present

## 2017-06-24 DIAGNOSIS — G40909 Epilepsy, unspecified, not intractable, without status epilepticus: Secondary | ICD-10-CM | POA: Diagnosis not present

## 2017-06-24 NOTE — Progress Notes (Signed)
[facility], [place]  [address]  [place] [address]    Neurology Initial Visit Note    [name]   [id number]      Date of service: 06/24/2017  Date of Birth:  [birth year]    Ruben Irani, MD  [place], [place] [address]    Information obtained from: patient, daughter and history reviewed via medical record  Chief Complaint:  Sleep Problem (Pt's daughter reports the pt has major sleep disturbance) and Seizures (pt's daughter reports the pt's last major seizure was 2 years ago. Pt has absent seizures.)    Subjective:/HPI: [name] is a 67 y.o. male, very pleasant to talk,came to our clinic today for the evaluation of   Seizure disorder status post of temporal lobectomy, severe sleep apnea, episodes of confusion. In today's discussion,  Patient is accompanied by her daughter who mentions that patient is hard of hearing and she gives most amount of the history on behalf of the patient.  She mentioned that patient had Right temporal resection was in Feb 2015, [facility] due to the intractable seizures he suffered for about more than 30 years.  His previous neurology was Dr. Candiss Norse  He has been seen in this clinic in 2016 (Theresa J Triggs, APRN). At that time he was on Trileptal and Keppra.  He stopped taking  Trileptal by himself and currently taking Keppra 1000 mg p.o. B.i.d..Last reported seizure episode was probably 2 years ago, he has to get generalized tonic-clonic seizures or partial seizures or sometimes both.  Upon further discussion, patient mentions that he wants to come off [place] as well and if possible would like to stop all the medication he is on right now.  He also has significant history of for tic valve replacement, currently on Coumadin.  Reportedly he was on multiple medications for seizure control and it did not help, later study suggested a focus of seizure from right temporal lobe which was resected.  We do not have any specific medical records  so far but review of records suggest that patient also had nasal  Hippocampectomy along with right anterior temporal lobectomy.   Daughter further mentions about episodes of confusion which is more like not following instructions properly or forgetting things more easily.  No specific reports of staring spells/ nighttime seizures symptoms /waking up in the morning feeling tired and sore all over the body /tongue bite or other seizure-like activity reported so far.    Patient also has severe case of sleep apnea, now working for sleep studyfor CPAP since previous study showed severe sleep apnea.   He   Endorses about not do any exercises and drinks only 16 oz of juice/water and anti day.       Denies severe headache, vision complaints, numbness tingling, nausea vomiting, falling episodes, radiating pain from neck or lower back motor weakness , slurred speech or TIA like symptoms.    Review of Systems  Constitutional: negative for fevers, chills and weight loss  Eyes: negative for visual disturbance, redness, or watery discharge  Ears, nose, mouth, throat, and face: negative for hearing loss, ear aches or hoarseness  Respiratory: negative for cough, wheezing, or dyspnea on exertion  Cardiovascular: negative for chest pressure/discomfort, palpitations, near-syncope, syncope  Gastrointestinal: negative for dysphagia,or abdominal pain  Genitourinary: negative for frequency, dysuria and hematuria.   Integument/breast: negative for rash, lesions  Hematologic/lymphatic: negative for lymphadenopathy  Musculoskeletal:  As mentioned in HPI.  Neurological:  as mentioned in HPI negative for seizures and stroke or TIA symptoms  Behavioral/Psych: Normal behavior, mood, and speech    Past Medical History:   Diagnosis Date   . Abnormal EKG    . Anxiety    . Aortic valve replaced    . Bladder stone    . Depression    . Diverticulitis    . Kidney stones    . Rectal bleeding    . Seizures    . Viral hepatitis C    . Wears glasses                  Family History     Problem Relation Age of Onset Comments    COPD Mother      Heart Disease Father      Heart Surgery Father  CABG    Prostate Cancer Father 50         Social History     Socioeconomic History   . Marital status: Married     Spouse name: Olin Hauser   . Number of children: 2   . Years of education: 12   . Highest education level: Not on file   Social Needs   . Financial resource strain: Not on file   . Food insecurity - worry: Not on file   . Food insecurity - inability: Not on file   . Transportation needs - medical: Not on file   . Transportation needs - non-medical: Not on file   Occupational History   . Occupation: disabled     Employer: [employer]   Tobacco Use   . Smoking status: Current Every Day Smoker     Packs/day: 1.00     Last attempt to quit: 06/01/2013     Years since quitting: 4.0   . Smokeless tobacco: Never Used   Substance and Sexual Activity   . Alcohol use: No     Alcohol/week: 0.0 oz     Comment: denied   . Drug use: No   . Sexual activity: Yes     Partners: Female   Other Topics Concern   . Ability to Walk 1 Flight of Steps without SOB/CP Yes   . Routine Exercise Not Asked   . Ability to Walk 2 Flight of Steps without SOB/CP Yes   . Unable to Ambulate Not Asked   . Total Care Not Asked   . Ability To Do Own ADL's Yes   . Uses Walker Not Asked   . Other Activity Level Not Asked   . Uses Cane Not Asked   . Uses Cane Not Asked   . Uses walker Not Asked   . Uses wheelchair Not Asked   . Right hand dominant Not Asked   . Left hand dominant Not Asked   . Ambidextrous Not Asked   . Shift Work Not Asked   . Unusual Sleep-Wake Schedule Not Asked   Social History Narrative   . Not on file     Allergies   Allergen Reactions   . Morphine  Other Adverse Reaction (Add comment)     combative         Physical Examination:    Most Recent Vitals      Office Visit from 06/24/2017 in [facility], [place]   Temperature  No data   Heart Rate  88 filed at... 06/24/2017 1115   Respiratory Rate   No data   BP (Non-Invasive)  146/90  (Abnormal)  filed at... 06/24/2017 1115  Height  1.829 m (6') filed at... 06/24/2017 1115   Weight  121 kg (266 lb 12.8 oz) filed at... 06/24/2017 1115   BMI (Calculated)  36.26 filed at... 06/24/2017 1115   BSA (Calculated)  2.48 filed at... 06/24/2017 1115        General: appears in good health, morbidly obese, no distress and vital signs reviewed and discussed with patient  HEENT: without erythema or injection, mucous membranes moist,  no thyromegaly or lymphadenopathy.  Lungs: Clear to auscultation bilaterally.   Cardiovascular: regular rate and rhythm  Carotids:   Normal pulsations without bruit on auscultation  Extremities: extremities normal, atraumatic, no cyanosis or edema  Ophthalmoscopic Examination: Normal disc and posterior segements.    Neurological examination    Mental status:  Level of Consciousness: Alert  Orientations: Alert and oriented x 3  Memory: Grossly normal  Language and Speech: Grossly normal.  Attentention Span: Normal  Registration/Recall: 3 words normal.  Fund of Knowledge: Normal with with good insight               MMSE score:     Cranial nerves:   CN2: Visual acuity and fields intact, exam:  Fundi: normal  CN 3,4,6: EOMI, PERRLA, no nystagmus  CN 5Facial sensation intact, brisk coreneal reflex  CN 7Face symmetrical, normal strength  CN 8: Hearing grossly intact  CN 9,10: Palate symmetric   CN 11: Sternocleidomastoid and Trapezius have normal strength.  CN 12: Tongue normal with no fasiculations or deviation    Gait: No postural instability, Romberg's negative, tandem walking is normal. Normal gait.    Coordination: No action tremor noted over bilateral upper extremity on finger-to-nose test, no ataxia/dysmetria, no resting tremor/dyskinesias, no head tremors or vocal tremors.    Muscle tone:  Upper and lower muscle tone is normal    Motor strength:  Motor strength is normal throughout except mentioned below:                                Muscle  atrophy:  none                               Twitching/fasciculations: none  Arm Right Left Leg Right Left   Deltoid 5/5 5/5 Iliopsoas 5/5 5/5   Biceps 5/5 5/5 Quads 5/5 5/5   Triceps 5/5 5/5 Hamstrings 5/5 5/5   Wrist Extension 5/5 5/5 Ankle Dorsi Flexion 5/5 5/5   Wrist Flexion 5/5 5/5 Ankle Plantar Flexion 5/5 5/5   Interossei   Ankle Eversion     APB   Ankle Inversion       Reflexes:    RJ BJ TJ KJ AJ Plantars Hoffman's   Right 2+ 2+ 2+ 2+ 2+ Downgoing    Left 2+ 2+ 2+ 2+ 2+ Downgoing        Sensory: Sensory exam in the upper and lower extremities is normal    Misc: None for today's exam.      Labs:   No results were found from the past 30 days.   Office Visit on 06/17/2017   Component Date Value Ref Range Status   . PROTHROMBIN TIME 06/17/2017 17.7   Final   . INR 06/17/2017 1.5   Final             Assessment:  SHALOM MCGUINESS is a 67 y.o. male seen today for evaluation  and assessment of abovementioned complaints:    Diagnosis and recommendations:    ICD-10-CM    1. Localization-related focal epilepsy with complex partial seizures (CMS HCC) G40.209 COMPREHENSIVE METABOLIC PROFILE - [facility] ONLY     FOLATE     VITAMIN B1 (THIAMIN), WHOLE BLOOD     VITAMIN B6 (PYRIDOXAL 5-PHOSPHATE), PLASMA   2. OSA (obstructive sleep apnea) G47.33    3. Status post lobectomy of brain Z90.89    4. Folate deficiency E53.8 FOLATE     VITAMIN B1 (THIAMIN), WHOLE BLOOD     VITAMIN B6 (PYRIDOXAL 5-PHOSPHATE), PLASMA   5. Epilepsy (CMS [place]) G40.909 Refer to [facility]       Patient's  Signs/symptoms, current clinical description, examination findings and review of available medical records plus labs are consistent with above-mentioned diagnosis.    1. 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  Previously was on Trileptal and Keppra but he stopped taking Trileptal and currently is only on Keppra 1000 mg p.o. B.i.d..  He is  willing to discontinue or stop taking Keppra as well.     Discussed in detail and strongly advised to continue Keppra 1000 mg p.o. B.i.d. For rest of his life given his strong history of intractable seizure episodes status post surgery.  Patient denies any specific side effects related to [place] so far.      No specific concerns for seizure episodes or intractable seizures at this time although the episodes of confusion are more likely related to severe sleep apnea /memory deficits rather than partial seizure episodes or absence epilepsy.      -Will consider repeat EEG in subsequent evaluation if needed.      -Patient liver function was reportedly abnormal, repeat liver function has been ordered today.  If needed will check for Keppra level as well.    2.Obstructive sleep apnea:  -    - His previous sleep study suggested severe sleep apnea and currently he is in process to get a CPAP titration study.  He should have improvement in mood / memory /episodes of confusion during the day after using CPAP machine for about 1-2 months.  Advised to call back if patient still has concerns for possible seizure like activity      3.Folate deficiency:  -    Folate was very low and patient is not on any supplements for folate.  Follow-up labs for folate as well as vitamin B1 / B6 has been ordered.    Will start on folate as well as otherwise meds accordingly.          Advises given for:-   Daily regular exercises as much tolerated,  Brain challenging exercises like puzzles /              crosswords/ Sudoku as much possible.    Plenty of water throughout the day 32 oz 2-4 times.   Adequate sleep time for about 7-8 hours per night   Avoid stressors and depressed mood as much as possible.      Follow-up in 6 months  or sooner as needed.         Docia Furl, MD  06/24/2017, 11:38      #  Portions of this note has been dictated using voice recognition software or a dictation service.  So variances in spelling and vocabulary are  possible but totally unintentional. Not all errors  are caught/corrected. Please notify the [facility] if any discrepancies are noted or if the meaning of any statement is not clear.

## 2017-06-25 ENCOUNTER — Encounter (INDEPENDENT_AMBULATORY_CARE_PROVIDER_SITE_OTHER): Payer: Self-pay

## 2017-06-25 ENCOUNTER — Inpatient Hospital Stay (INDEPENDENT_AMBULATORY_CARE_PROVIDER_SITE_OTHER)
Admission: RE | Admit: 2017-06-25 | Discharge: 2017-06-25 | Disposition: A | Discharge status: Home / Self Care | Payer: Medicare Other | Source: Ambulatory Visit

## 2017-06-25 DIAGNOSIS — I700 Atherosclerosis of aorta: Secondary | ICD-10-CM | POA: Diagnosis not present

## 2017-06-25 DIAGNOSIS — I779 Disorder of arteries and arterioles, unspecified: Secondary | ICD-10-CM | POA: Diagnosis not present

## 2017-06-25 DIAGNOSIS — Z136 Encounter for screening for cardiovascular disorders: Secondary | ICD-10-CM | POA: Diagnosis not present

## 2017-06-25 DIAGNOSIS — F17210 Nicotine dependence, cigarettes, uncomplicated: Secondary | ICD-10-CM | POA: Diagnosis not present

## 2017-06-25 DIAGNOSIS — I6523 Occlusion and stenosis of bilateral carotid arteries: Secondary | ICD-10-CM | POA: Diagnosis not present

## 2017-06-25 DIAGNOSIS — I340 Nonrheumatic mitral (valve) insufficiency: Secondary | ICD-10-CM | POA: Diagnosis not present

## 2017-06-25 DIAGNOSIS — I361 Nonrheumatic tricuspid (valve) insufficiency: Secondary | ICD-10-CM | POA: Diagnosis not present

## 2017-06-25 DIAGNOSIS — Z952 Presence of prosthetic heart valve: Secondary | ICD-10-CM

## 2017-06-25 DIAGNOSIS — Z720 Tobacco use: Secondary | ICD-10-CM

## 2017-06-25 DIAGNOSIS — I25118 Atherosclerotic heart disease of native coronary artery with other forms of angina pectoris: Secondary | ICD-10-CM

## 2017-06-25 DIAGNOSIS — I6529 Occlusion and stenosis of unspecified carotid artery: Secondary | ICD-10-CM

## 2017-06-25 LAB — ABDOMINAL DUPLEX - AORTA - MEDICARE SCREEN FOR AAA
Abdominal dist aorta AP: 1.9 cm
Abdominal dist aorta trans: 1.8 cm
Abdominal dist aorta vel: 68 cm/s
Abdominal mid aorta AP: 2 cm
Abdominal mid aorta trans: 2.3 cm
Abdominal mid aorta vel: 51 cm/s
Abdominal prox aorta AP: 2.7 cm
Abdominal prox aorta AP: 2.7 cm
Abdominal prox aorta trans: 2.5 cm
Abdominal prox aorta vel: 64 cm/s

## 2017-06-30 NOTE — Progress Notes (Addendum)
[place] [place] [address]  [phone]    Date: 07/01/2017  Patient Name: [name]  MRN#: [id number]  DOB: Apr 18, 1950    Provider: Clementeen Graham, MD  PCP: [name], MD      Reason for visit: Heart Disease (1 month follow up)    History:     [name] is a 67 y.o. male with a history of CAD, Valvular Heart Disease, and OSA. He was last seen 06/03/17 with no cardiac related on complaints. An Echo was done to reassess AV, Carotid duplex was done to assess for stenosis given bruit on PE, and an AAA screening was done to assess for for AAA. Echo showed EF 65-70%, aortic valve prosthesis appeared well seated with mild AS, and trace AI. Carotid Duplex showed <50% stenosis bilaterally, and AAA Screening was negative for AAA. ASA 81 mg daily, Coreg 3.125 mg BID, and Lipitor 10 mg daily were started for secondary prevention.    Today he reports feeling very fatigue. He has a hx of OSA and is having a CPAP titration tonight. No c/o any CP, SOB, palpitations, syncope/presyncope, orthopnea/PND, LE edema, or claudication.    Assessment:     [name] is a 67 y.o. male with    CAD: s/p NSTEMI and borderline troponin 2015 hospitalization - Nuclear stress 10/01/13 No regional wall motion abnormalities. Perfusion images show normal tracer uptake. Low risk study.  - denies CP and/or SOB    Valvular Heart Disease: s/p AVR 2002 [employer] Mechanical aortic valve. Echo 08/16/15 Functioning prosthetic aortic valve. Echo 06/25/17 Aortic valve prosthesis appeared well seated with mild AS, and trace AI  - INR goal 3-3.5    Hx of seizures:  - had a temporal lobectomy    Aorta Dilation: Echo 06/25/17  Mild aortic root dilation, mild ascending aorta dilation. AAA Screening 06/25/17 No AAA    BP Status: controlled today    Lipid Status: FLP 06/17/16 total 181, trig 91, HDL 50, LDL 113  - on Lipitor    OSA:  - has sleep study for CPAP titration today    Current  smoker: 1 PPD    Plan:     1. Continue current medications  2. Discussed the importance of OSA treatment and CPAP compliance.  3. Advised to start OTC vitamin E until he can quit smoking  4. Start Ramipril 5 mg daily as borderline BPs and need to risk modify   5. Encouraged a heart healthy diet and regular exercise with weight loss  6. Encouraged to do toe lifts, keep legs elevated at rest, massage legs with coco butter, and use compression stockings.Encouraged to stay well hydrated  7. Strongly encouraged smoking cessation  8. Follow up in 6 months    Cardiovascular Workup:     AVR 2002 at [facility] Aortic Valve    Echo 08/17/13  The left ventricular cavity is small.  There is mild asymmetric septal left ventricular hypertrophy.  The left ventricle is hyperdynamic with an EF greater than 70%.  No regional wall motion abnormalities noted.  Right ventricular systolic pressure is elevated at 30 to 40 mmHg.  Mitral inflow and tissue Doppler suggestive of grade 1 diastolic dysfunction.    Nuclear Stress Test 10/01/13  1. LV systolic function appears normal   2. Calculated LVEF 69%   3. No regional wall motion abnormalities.   4. Perfusion  images show normal tracer uptake.   5. Low risk study.     Echo 08/16/15  1. Normal left ventricular systolic function with a visually estimated ejection fraction of 60%.    2. Mildly concentric left ventricular hypertrophy.  3. Functioning prosthetic aortic valve    EKG 06/03/17  Sinus tachycardia  Left axis deviation  Nonspecific T wave abnormality    Echo 06/25/17  1. Normal left ventricular size. LV Ejection Fraction is 65-70 %. Normal geometry. Left ventricular diastolic parameters were normal.  2. Resting Segmental Wall Motion Analysis: Total wall motion score is 1.00. There are no regional wall motion abnormalities.  3. Moderately dilated right ventricle. Normal right ventricular systolic function. RV systolic pressure is at the upper limits  of normal. RVSP 34.0 mmHg.  4. Mildly dilated right atrium.  5. There is mild primary mitral valve regurgitation.  6. Trace aortic regurgitation seen. A mechanical prosthetic valve is present in the aortic position. The aortic valve prosthesis appears well seated. The aortic prosthesis demonstrates a mildly increased transvalvular gradient for valve type and size. This is suggestive of mild prosthetic aortic stenosis.  7. There is mild tricuspid regurgitation.  8. There is mild aortic root dilation. There is mild ascending aorta dilation.  9. Sporadic PACs.    AAA Screening 06/25/17  Due to the presence of heavy shadowing, iliac arteries were not visualized well.  No aneurysm was visualized in the abdominal aorta.  Mild atherosclerosis was visualized in the abdominal aorta.    Carotid/Subclavian Duplex 06/25/17  Carotid arteries are < 50% stenotic bilaterally.  Subclavian arteries are mildly plaqued with velocities within normal range.  Vertebral arteries have antegrade flow.    Review of Systems:     All systems were reviewed and are negative other than noted in the HPI.    Physical Exam:     Vitals:    07/01/17 1123   BP: 137/80   Pulse: 75   Resp: 15   SpO2: 97%   Weight: 120.5 kg (265 lb 9.6 oz)       Body mass index is 36.02 kg/m.      Cardiovascular:  RRR  No murmur  No rubs, clicks, or gallops  Crisp mechanical valve sound    Pulse exam:  2+ radial pulses  2+ DP pulses  2+ PT pulses    Extremities: Warm and well-perfused, trace to 1+ LE edema    Neck: Supple, no JVD, no bruits, scarring in the RT temporal area following temporal lobectomy  General: Awake, alert, and in no acute distress  HEENT: Moist mucous membranes  Respiratory: Clear to auscultation  Abdominal/Gastrointestinal: Soft, non-tender, no masses, no organomegaly  Skin: Non jaundiced, no rashes  Neurological: Grossly nonfocal  Psychiatric: Normal affect    Past Medical History:     Past Medical History:   Diagnosis Date   . Abnormal EKG    .  Anxiety    . Aortic valve replaced    . Bladder stone    . Depression    . Diverticulitis    . Kidney stones    . Rectal bleeding    . Seizures    . Viral hepatitis C    . Wears glasses          Past Surgical History:     Past Surgical History:   Procedure Laterality Date   . HX AORTIC VALVE REPLACEMENT     . HX CHOLECYSTECTOMY     . HX COLECTOMY  2010    at [facility] for diverticulitis.   Marland Kitchen HX COLONOSCOPY     . HX KNEE REPLACMENT Left          Allergies:     Allergies   Allergen Reactions   . Morphine  Other Adverse Reaction (Add comment)     combative       Medications:     Current Outpatient Medications   Medication Sig   . aspirin (ASPIR-81 ORAL) Take by mouth   . atorvastatin (LIPITOR) 10 mg Oral Tablet Take 1 Tab (10 mg total) by mouth Once a day   . carvedilol (COREG) 3.125 mg Oral Tablet Take 1 Tab (3.125 mg total) by mouth Twice daily with food   . escitalopram oxalate (LEXAPRO) 20 mg Oral Tablet Take 1 Tab (20 mg total) by mouth Once a day   . levETIRAcetam (KEPPRA) 1,000 mg Oral Tablet Take 1 Tab (1,000 mg total) by mouth Twice daily   . raNITIdine (ZANTAC) 150 mg Oral Tablet Take 1 Tab (150 mg total) by mouth Twice daily   . tamsulosin (FLOMAX) 0.4 mg Oral Capsule Take 1 Cap (0.4 mg total) by mouth Every evening after dinner   . traZODone (DESYREL) 100 mg Oral Tablet Take 1 Tab (100 mg total) by mouth Every night   . warfarin (COUMADIN) 5 mg Oral Tablet Take 1 Tab (5 mg total) by mouth Every Monday, Wednesday and Friday   . warfarin (COUMADIN) 7.5 mg Oral Tablet Take 1 Tab (7.5 mg total) by mouth Every Sunday, Tuesday, Thursday and Saturday     Family History:     Family Medical History:     Problem Relation (Age of Onset)    COPD Mother    Heart Disease Father    Heart Surgery Father    No Known Problems Sister, Brother, Maternal Uncle, Paternal 41, Paternal Uncle, Maternal Grandmother, Maternal Grandfather, Paternal 40, Paternal 22, Son, Daughter    Prostate Cancer Father (80)              Social History:     Social History     Socioeconomic History   . Marital status: Married     Spouse name: Olin Hauser   . Number of children: 2   . Years of education: 51   . Highest education level: Not on file   Occupational History   . Occupation: disabled     Employer: [employer]   Social Needs   . Financial resource strain: Not on file   . Food insecurity:     Worry: Not on file     Inability: Not on file   . Transportation needs:     Medical: Not on file     Non-medical: Not on file   Tobacco Use   . Smoking status: Current Every Day Smoker     Packs/day: 1.00     Last attempt to quit: 06/01/2013     Years since quitting: 4.0   . Smokeless tobacco: Never Used   Substance and Sexual Activity   . Alcohol use: No     Alcohol/week: 0.0 oz     Comment: denied   . Drug use: No   . Sexual activity: Yes     Partners: Female   Lifestyle   . Physical activity:     Days per week: Not on file     Minutes per session: Not on file   . Stress: Not on file   Relationships   . Social  connections:     Talks on phone: Not on file     Gets together: Not on file     Attends religious service: Not on file     Active member of club or organization: Not on file     Attends meetings of clubs or organizations: Not on file     Relationship status: Not on file   . Intimate partner violence:     Fear of current or ex partner: Not on file     Emotionally abused: Not on file     Physically abused: Not on file     Forced sexual activity: Not on file   Other Topics Concern   . Ability to Walk 1 Flight of Steps without SOB/CP Yes   . Routine Exercise Not Asked   . Ability to Walk 2 Flight of Steps without SOB/CP Yes   . Unable to Ambulate Not Asked   . Total Care Not Asked   . Ability To Do Own ADL's Yes   . Uses Walker Not Asked   . Other Activity Level Not Asked   . Uses Cane Not Asked   . Uses Cane Not Asked   . Uses walker Not Asked   . Uses wheelchair Not Asked   . Right hand dominant Not Asked   . Left hand dominant Not Asked   .  Ambidextrous Not Asked   . Shift Work Not Asked   . Unusual Sleep-Wake Schedule Not Asked   Social History Narrative   . Not on file       Thank you for allowing me to participate in the care of your patient. Please feel free to contact me if there are further questions.     I am scribing for, and in the presence of, Dr. Clementeen Graham, MD for services provided on 07/01/2017.  Johnella Moloney, SCRIBE  Johnella Moloney, SCRIBE  07/01/2017, 11:55    I personally performed the services described in this documentation, as scribed in my presence, and it is both accurate and complete.    Clementeen Graham, MD    Clementeen Graham, MD  07/02/2017, 12:54

## 2017-07-01 ENCOUNTER — Ambulatory Visit (HOSPITAL_BASED_OUTPATIENT_CLINIC_OR_DEPARTMENT_OTHER)
Admission: RE | Admit: 2017-07-01 | Discharge: 2017-07-01 | Disposition: A | Discharge status: Home / Self Care | Payer: Medicare Other | Source: Ambulatory Visit | Attending: PULMONARY DISEASE | Admitting: PULMONARY DISEASE

## 2017-07-01 ENCOUNTER — Ambulatory Visit (INDEPENDENT_AMBULATORY_CARE_PROVIDER_SITE_OTHER): Payer: Medicare Other | Admitting: Cardiovascular Disease

## 2017-07-01 ENCOUNTER — Encounter (INDEPENDENT_AMBULATORY_CARE_PROVIDER_SITE_OTHER): Payer: Self-pay | Admitting: Cardiovascular Disease

## 2017-07-01 VITALS — BP 137/80 | HR 75 | Resp 15 | Wt 265.6 lb

## 2017-07-01 DIAGNOSIS — G4733 Obstructive sleep apnea (adult) (pediatric): Secondary | ICD-10-CM | POA: Diagnosis not present

## 2017-07-01 DIAGNOSIS — Z952 Presence of prosthetic heart valve: Secondary | ICD-10-CM | POA: Diagnosis not present

## 2017-07-01 DIAGNOSIS — I25118 Atherosclerotic heart disease of native coronary artery with other forms of angina pectoris: Secondary | ICD-10-CM | POA: Diagnosis not present

## 2017-07-01 DIAGNOSIS — I6529 Occlusion and stenosis of unspecified carotid artery: Secondary | ICD-10-CM | POA: Diagnosis not present

## 2017-07-01 DIAGNOSIS — Z720 Tobacco use: Secondary | ICD-10-CM | POA: Diagnosis not present

## 2017-07-01 MED ORDER — RAMIPRIL 5 MG CAPSULE
5.0000 mg | ORAL_CAPSULE | Freq: Every day | ORAL | 1 refills | Status: AC
Start: 2017-07-01 — End: ?

## 2017-07-03 NOTE — Procedures (Signed)
NAME:  [name] NUMBER:  [id number]  DATE OF SERVICE:  07/01/2017  DOB:  December 06, 1950  SEX:  M      REFERRING PHYSICIAN:  [name], MD.    INDICATIONS FOR TESTING:  Excessive daytime somnolence and difficulty initiating and maintaining sleep.  The patient had a home study of June 03, 2017, with an Apnea-Hypopnea Index of 21.7.  The patient has used CPAP in the past and has a machine at home, but uses it irregularly.  The patient has a history of intractable seizures and underwent a lobectomy with good effect on his seizure disorder.  The patient [facility] at [facility] [place], [place], on July 01, 2017, for 522.5 minutes.  CPAP titration was initiated at 6 cm and titrated for respiratory events and snoring.  At a pressure of 8 cm the Apnea-Hypopnea Index was 0.7, the REM Apnea-Hypopnea Index was 2.7.  The minimum oxygen saturation was 88%.  The patient had a total of 169.6 minutes of sleep at this pressure, including 22.1 minutes in REM sleep.    IMPRESSION:  1. Sleep-disordered breathing.  2. Obstructive sleep apnea,successful CPAP titration.    RECOMMENDATION:  CPAP at 8 cm of pressure.  The technician reports that the patient used the ResMed AirFit P 10, medium-sized nasal pillows during testing.        Merleen Milliner, MD          CC:   [name], MD   [place], [place] [address]       DD:  07/02/2017 17:52:19  DT:  07/03/2017 02:07:20 DJ  D#:  [id number]

## 2017-07-07 ENCOUNTER — Other Ambulatory Visit (INDEPENDENT_AMBULATORY_CARE_PROVIDER_SITE_OTHER): Payer: Self-pay | Admitting: Family Medicine

## 2017-07-07 ENCOUNTER — Other Ambulatory Visit (INDEPENDENT_AMBULATORY_CARE_PROVIDER_SITE_OTHER): Payer: Self-pay

## 2017-07-07 ENCOUNTER — Telehealth (INDEPENDENT_AMBULATORY_CARE_PROVIDER_SITE_OTHER): Payer: Self-pay | Admitting: Family Medicine

## 2017-07-07 DIAGNOSIS — G4733 Obstructive sleep apnea (adult) (pediatric): Principal | ICD-10-CM

## 2017-07-07 DIAGNOSIS — Z9989 Dependence on other enabling machines and devices: Principal | ICD-10-CM

## 2017-07-07 NOTE — Telephone Encounter (Signed)
Please review [facility] medical equipment. Please sign and date and bring up front to to be faxed.  Sula Soda  07/07/2017, 13:52

## 2017-07-07 NOTE — Telephone Encounter (Signed)
done

## 2017-07-10 NOTE — Progress Notes (Signed)
Faxed to [facility]. 

## 2017-07-15 ENCOUNTER — Ambulatory Visit (INDEPENDENT_AMBULATORY_CARE_PROVIDER_SITE_OTHER): Payer: Self-pay | Admitting: Family Medicine

## 2017-07-15 ENCOUNTER — Encounter (INDEPENDENT_AMBULATORY_CARE_PROVIDER_SITE_OTHER): Payer: Self-pay

## 2017-07-22 ENCOUNTER — Ambulatory Visit (INDEPENDENT_AMBULATORY_CARE_PROVIDER_SITE_OTHER): Payer: Medicare Other

## 2017-07-22 DIAGNOSIS — Z5181 Encounter for therapeutic drug level monitoring: Secondary | ICD-10-CM | POA: Diagnosis not present

## 2017-07-22 DIAGNOSIS — Z7901 Long term (current) use of anticoagulants: Secondary | ICD-10-CM | POA: Diagnosis not present

## 2017-07-22 DIAGNOSIS — Z952 Presence of prosthetic heart valve: Secondary | ICD-10-CM | POA: Diagnosis not present

## 2017-07-22 DIAGNOSIS — F17210 Nicotine dependence, cigarettes, uncomplicated: Secondary | ICD-10-CM

## 2017-07-22 LAB — POCT EAST PROTHROMBIN TIME (AMB)
INR: 2.6
PROTHROMBIN TIME: 32.7

## 2017-07-22 NOTE — Progress Notes (Signed)
[facility]  [address]  [place] [address]       Name: [name] MRN:  [id number]   Date: 07/22/2017 Age: 67 y.o.       Subjective:     [name] is a 67 y.o. male here for warfarin management.      Indication for warfarin: aortic mechanic heart valve    Goal: 2.0-3.0   Current dose: 5 mg mwf, 7.5 mg all others  Duration:  Lifetime    Also complains of:      1.   Other medical issues include:  Patient Active Problem List   Diagnosis   . Aortic Mechanical heart valve present   . H/O diverticulitis of colon s/p partial colectomy   . Epilepsy ([place])   . Status post lobectomy of brain   . Tension headache, chronic   . Insomnia   . Joint pain   . Anticoagulation goal of INR 2 to 3   . OSA (obstructive sleep apnea)   . Osteoarthritis   . Elevated liver enzymes   . Fatty liver disease, nonalcoholic   . Obesity   . Tobacco abuse   . Folic acid deficiency anemia       Past Medical History:   Diagnosis Date   . Abnormal EKG    . Anxiety    . Aortic valve replaced    . Bladder stone    . Depression    . Diverticulitis    . Kidney stones    . Rectal bleeding    . Seizures    . Viral hepatitis C    . Wears glasses          Past Surgical History:   Procedure Laterality Date   . HX AORTIC VALVE REPLACEMENT     . HX CHOLECYSTECTOMY     . HX COLECTOMY  2010    at [facility] for diverticulitis.   Marland Kitchen HX COLONOSCOPY     . HX KNEE REPLACMENT Left          Social History     Socioeconomic History   . Marital status: Married     Spouse name: Olin Hauser   . Number of children: 2   . Years of education: 69   . Highest education level: Not on file   Occupational History   . Occupation: disabled     Employer: [employer]   Social Needs   . Financial resource strain: Not on file   . Food insecurity:     Worry: Not on file     Inability: Not on file   . Transportation needs:     Medical: Not on file     Non-medical: Not on file   Tobacco Use   . Smoking status: Current Every Day Smoker     Packs/day: 1.00     Last  attempt to quit: 06/01/2013     Years since quitting: 4.1   . Smokeless tobacco: Never Used   Substance and Sexual Activity   . Alcohol use: No     Alcohol/week: 0.0 oz     Comment: denied   . Drug use: No   . Sexual activity: Yes     Partners: Female   Lifestyle   . Physical activity:     Days per week: Not on file     Minutes per session: Not on file   . Stress: Not on file   Relationships   . Social  connections:     Talks on phone: Not on file     Gets together: Not on file     Attends religious service: Not on file     Active member of club or organization: Not on file     Attends meetings of clubs or organizations: Not on file     Relationship status: Not on file   . Intimate partner violence:     Fear of current or ex partner: Not on file     Emotionally abused: Not on file     Physically abused: Not on file     Forced sexual activity: Not on file   Other Topics Concern   . Ability to Walk 1 Flight of Steps without SOB/CP Yes   . Routine Exercise Not Asked   . Ability to Walk 2 Flight of Steps without SOB/CP Yes   . Unable to Ambulate Not Asked   . Total Care Not Asked   . Ability To Do Own ADL's Yes   . Uses Walker Not Asked   . Other Activity Level Not Asked   . Uses Cane Not Asked   . Uses Cane Not Asked   . Uses walker Not Asked   . Uses wheelchair Not Asked   . Right hand dominant Not Asked   . Left hand dominant Not Asked   . Ambidextrous Not Asked   . Shift Work Not Asked   . Unusual Sleep-Wake Schedule Not Asked   Social History Narrative   . Not on file     Allergies   Allergen Reactions   . Morphine  Other Adverse Reaction (Add comment)     combative     Medications reviewed    Since the last visit has the patient experienced any of the following:    1. Unusual bruising bleeding   No  2. Nosebleeds    No  3. Blood in urine or change in color  No  4. Change in diet    No  (change intake of vitamin K rich foods)  5. Alcohol intake    No  6. Acute illness in past 10 days  No  (i.e. Fever, sore, throat,  diarrhea)   7. Missed dose (per patient)   No  8. Falls or injuries    No  9. Symptoms of thromboembolic event No  10. Hospitalization/ER visit   No    Objective:     There were no vitals taken for this visit.         Labs: PT/INR = 32.7/2.6        Assessment/Plan:       1. Anticoagulation   Dose:  Continue current dose  Education:  none    Labs ordered:     Orders Placed This Encounter   . POCT EAST PROTHROMBIN TIME (AMB)    cosigned by Dr. Cheryln Manly    Current Outpatient Medications   Medication Sig   . aspirin (ASPIR-81 ORAL) Take by mouth   . atorvastatin (LIPITOR) 10 mg Oral Tablet Take 1 Tab (10 mg total) by mouth Once a day   . carvedilol (COREG) 3.125 mg Oral Tablet Take 1 Tab (3.125 mg total) by mouth Twice daily with food   . escitalopram oxalate (LEXAPRO) 20 mg Oral Tablet Take 1 Tab (20 mg total) by mouth Once a day   . levETIRAcetam (KEPPRA) 1,000 mg Oral Tablet Take 1 Tab (1,000 mg total) by mouth Twice daily   . ramipril (  ALTACE) 5 mg Oral Capsule Take 1 Cap (5 mg total) by mouth Once a day   . raNITIdine (ZANTAC) 150 mg Oral Tablet Take 1 Tab (150 mg total) by mouth Twice daily   . tamsulosin (FLOMAX) 0.4 mg Oral Capsule Take 1 Cap (0.4 mg total) by mouth Every evening after dinner   . traZODone (DESYREL) 100 mg Oral Tablet Take 1 Tab (100 mg total) by mouth Every night   . warfarin (COUMADIN) 5 mg Oral Tablet Take 1 Tab (5 mg total) by mouth Every Monday, Wednesday and Friday   . warfarin (COUMADIN) 7.5 mg Oral Tablet Take 1 Tab (7.5 mg total) by mouth Every Sunday, Tuesday, Thursday and Saturday       follow up in 6 weeks      Liliana Cline, [facility]  07/22/2017, 13:58    I have reviewed and agree with the PharmD's plan of care as noted above.    Theresia Bough, MD

## 2017-07-29 ENCOUNTER — Encounter (INDEPENDENT_AMBULATORY_CARE_PROVIDER_SITE_OTHER): Payer: Self-pay | Admitting: Family Medicine

## 2017-08-05 ENCOUNTER — Ambulatory Visit (INDEPENDENT_AMBULATORY_CARE_PROVIDER_SITE_OTHER): Payer: Medicare Other | Admitting: Family Medicine

## 2017-08-05 ENCOUNTER — Encounter (INDEPENDENT_AMBULATORY_CARE_PROVIDER_SITE_OTHER): Payer: Self-pay | Admitting: Family Medicine

## 2017-08-05 ENCOUNTER — Ambulatory Visit
Admission: RE | Admit: 2017-08-05 | Discharge: 2017-08-05 | Disposition: A | Discharge status: Home / Self Care | Payer: Medicare Other | Source: Ambulatory Visit | Attending: Family Medicine | Admitting: Family Medicine

## 2017-08-05 VITALS — BP 118/68 | HR 76 | Temp 98.5°F | Resp 19 | Ht 72.0 in | Wt 270.2 lb

## 2017-08-05 DIAGNOSIS — M25561 Pain in right knee: Secondary | ICD-10-CM | POA: Diagnosis not present

## 2017-08-05 DIAGNOSIS — M11261 Other chondrocalcinosis, right knee: Secondary | ICD-10-CM | POA: Diagnosis not present

## 2017-08-05 DIAGNOSIS — Z720 Tobacco use: Secondary | ICD-10-CM | POA: Diagnosis not present

## 2017-08-05 DIAGNOSIS — G8929 Other chronic pain: Secondary | ICD-10-CM | POA: Diagnosis not present

## 2017-08-05 DIAGNOSIS — E782 Mixed hyperlipidemia: Secondary | ICD-10-CM | POA: Diagnosis not present

## 2017-08-05 DIAGNOSIS — Z9989 Dependence on other enabling machines and devices: Secondary | ICD-10-CM | POA: Diagnosis not present

## 2017-08-05 DIAGNOSIS — M1711 Unilateral primary osteoarthritis, right knee: Secondary | ICD-10-CM | POA: Diagnosis not present

## 2017-08-05 DIAGNOSIS — D489 Neoplasm of uncertain behavior, unspecified: Secondary | ICD-10-CM | POA: Diagnosis not present

## 2017-08-05 DIAGNOSIS — F5101 Primary insomnia: Secondary | ICD-10-CM | POA: Diagnosis not present

## 2017-08-05 DIAGNOSIS — I10 Essential (primary) hypertension: Secondary | ICD-10-CM | POA: Diagnosis not present

## 2017-08-05 DIAGNOSIS — G4733 Obstructive sleep apnea (adult) (pediatric): Secondary | ICD-10-CM | POA: Diagnosis not present

## 2017-08-05 MED ORDER — TIZANIDINE 4 MG TABLET
4.00 mg | ORAL_TABLET | Freq: Three times a day (TID) | ORAL | 1 refills | Status: DC | PRN
Start: 2017-08-05 — End: 2017-08-12

## 2017-08-05 NOTE — Progress Notes (Signed)
Left message for pt to callback regarding Xray results.  [name], [place]  08/05/2017, 13:56
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
## 2017-08-05 NOTE — Progress Notes (Signed)
[place]  [place]  [place] [address]  Dept: [phone]  Dept Fax: [phone]  Loc: [phone]  Loc Fax: [phone]    Patient: [name]  MRN: [id number]  Date of birth: June 21, 1950  Gender: male  Date of service: 08/05/17    Chief Complaint:   Insomnia and Knee Pain    Functional Health Screen        Vital Signs  BP 118/68   Pulse 76   Temp 36.9 C (98.5 F) (Oral)   Resp 19   Ht 1.829 m (6')   Wt 122.6 kg (270 lb 3.2 oz)   BMI 36.65 kg/m       Social History     Tobacco Use   Smoking Status Current Every Day Smoker   . Packs/day: 1.00   . Last attempt to quit: 06/01/2013   . Years since quitting: 4.1   Smokeless Tobacco Never Used     Patient Health Rating                          Allergies:  Allergies   Allergen Reactions   . Morphine  Other Adverse Reaction (Add comment)     combative     Medication History  [facility] Team  Patient Care Team:  [name], MD as PCP - General (GERIATRIC MEDICINE)  Oran Rein, MD as PCP - Cardiologist (EXTERNAL)    My clinic staff completed this information and I reviewed the information. I agree with the information.     [name], MD  08/05/2017, 10:46    HPI: Insomnia and Knee Pain  .  67 year old male here for follow-up he was normally followed by another primary care who has left the office.  He is here to go over the sleep study he does have obstructive sleep apnea and was told to use CPAP at 8 cm with a redimed air fit medium pillow mask he also takes trazodone for his primary insomnia.  Since the change in the pressure and the mask he is sleeping much better and feeling much more rested as far as the insomnia.  Essential hypertension is stable on the low dose of Altace.  Mixed hyperlipidemia he is taking the statin with no side effects.  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  He is complaining of chronic right knee pain.  The pain has been worse over the last 6 months and keeping him awake at night.   Some joint swelling no erythema.  He is asking about pain medication for the knee.    Past Medical History  Current Outpatient Medications   Medication Sig   . aspirin (ASPIR-81 ORAL) Take by mouth   . atorvastatin (LIPITOR) 10 mg Oral Tablet Take 1 Tab (10 mg total) by mouth Once a day   . carvedilol (COREG) 3.125 mg Oral Tablet Take 1 Tab (3.125 mg total) by mouth Twice daily with food   . escitalopram oxalate (LEXAPRO) 20 mg Oral Tablet Take 1 Tab (20 mg total) by mouth Once a day   . levETIRAcetam (KEPPRA) 1,000 mg Oral Tablet Take 1 Tab (1,000 mg total) by mouth Twice daily   . ramipril (ALTACE) 5 mg Oral Capsule Take 1 Cap (5 mg total) by mouth Once a day   . raNITIdine (ZANTAC) 150 mg Oral Tablet Take 1 Tab (  150 mg total) by mouth Twice daily   . tamsulosin (FLOMAX) 0.4 mg Oral Capsule Take 1 Cap (0.4 mg total) by mouth Every evening after dinner   . tiZANidine (ZANAFLEX) 4 mg Oral Tablet Take 1 Tab (4 mg total) by mouth Three times a day as needed   . traZODone (DESYREL) 100 mg Oral Tablet Take 1 Tab (100 mg total) by mouth Every night   . warfarin (COUMADIN) 5 mg Oral Tablet Take 1 Tab (5 mg total) by mouth Every Monday, Wednesday and Friday   . warfarin (COUMADIN) 7.5 mg Oral Tablet Take 1 Tab (7.5 mg total) by mouth Every Sunday, Tuesday, Thursday and Saturday     Allergies   Allergen Reactions   . Morphine  Other Adverse Reaction (Add comment)     combative     Past Medical History:   Diagnosis Date   . Abnormal EKG    . Anxiety    . Aortic valve replaced    . Bladder stone    . Depression    . Diverticulitis    . Kidney stones    . Rectal bleeding    . Seizures    . Viral hepatitis C    . Wears glasses          Past Surgical History:   Procedure Laterality Date   . HX AORTIC VALVE REPLACEMENT     . HX CHOLECYSTECTOMY     . HX COLECTOMY  2010    at [facility] for diverticulitis.   Marland Kitchen HX COLONOSCOPY     . HX KNEE  REPLACMENT Left          Family Medical History:     Problem Relation (Age of Onset)    COPD Mother    Heart Disease Father    Heart Surgery Father    No Known Problems Sister, Brother, Maternal Uncle, Paternal 73, Paternal Uncle, Maternal Grandmother, Maternal Grandfather, Paternal 56, Paternal 27, Son, Daughter    Prostate Cancer Father (12)            Social History     Socioeconomic History   . Marital status: Married     Spouse name: Olin Hauser   . Number of children: 2   . Years of education: 44   . Highest education level: Not on file   Occupational History   . Occupation: disabled     Employer: [employer]   Tobacco Use   . Smoking status: Current Every Day Smoker     Packs/day: 1.00     Last attempt to quit: 06/01/2013     Years since quitting: 4.1   . Smokeless tobacco: Never Used   Substance and Sexual Activity   . Alcohol use: No     Alcohol/week: 0.0 oz     Comment: denied   . Drug use: No   . Sexual activity: Yes     Partners: Female   Other Topics Concern   . Ability to Walk 1 Flight of Steps without SOB/CP Yes   . Ability to Walk 2 Flight of Steps without SOB/CP Yes   . Ability To Do Own ADL's Yes       BP 118/68   Pulse 76   Temp 36.9 C (98.5 F) (Oral)   Resp 19   Ht 1.829 m (6')   Wt 122.6 kg (270 lb 3.2 oz)   BMI 36.65 kg/m       Review of Systems -     Constitutional:  negative for fevers, chills and weight loss  Eyes: negative for visual disturbance  Ears, nose, mouth, throat, and face: negative for hearing loss, earaches and hoarseness  Respiratory: negative for cough or dyspnea on exertion  Cardiovascular: negative for chest pressure/discomfort, palpitations, near-syncope, syncope  Gastrointestinal: negative for dysphagia, nausea, vomiting, diarrhea, constipation, abdominal pain and blood in bowel movements  Genitourinary:  negative for frequency, dysuria and hematuria.  The patient denies urinary hesitancy, nocturia, frequency or incomplete voiding.   Integument/breast:  negative for rash and changed mole  Hematologic/lymphatic: negative for lymphadenopathy  Musculoskeletal:negative for myalgias,  and muscle weakness  Neurological: negative for seizures and stroke  or TIA symptoms  Behavioral/Psych:  PHQ2 completed  Fall Risk: assessment completed and at minimal risk of falls  Endocrine: negative for diabetic symptoms including polyuria, polydipsia and weight loss  Allergic/Immunologic: negative    EXAM:  General Vitals: BP 118/68   Pulse 76   Temp 36.9 C (98.5 F) (Oral)   Resp 19   Ht 1.829 m (6')   Wt 122.6 kg (270 lb 3.2 oz)   BMI 36.65 kg/m       General: moderately obese, appears older than stated age and no distress  Eyes: Pupils equal and round, reactive to light and accomodation. , Conjunctivae/corneas clear, PERRLA, EOM's intact. Marland Kitchen  HENT: Mouth mucous membranes moist , Pharynx without injection or exudate. , No oral lesions.   Neck: No JVD or thyromegaly or lymphadenopathy and thyroid: not enlarged, symmetric, no tenderness/mass/nodules  Lungs: clear to auscultation bilaterally.   Cardiovascular:    Heart regular rate and rhythm, S1, S2 normal, no murmur, click, rub or gallop  Abdomen: soft, non-tender, non-distended,  no masses and no hernias  Extremities: no edema, redness or tenderness in the calves; right knee no erythema some mild medial joint effusion with + crepitus with extension and flextion   Neurologic: gait is limping on right, CN II - XII grossly intact  and alert and oriented x3  Lymphatics: no lymphadenopathy  Psychiatric: affect normal, behavior normal, thought content normal, judgement normal       Assessment and Plan:     ICD-10-CM    1. OSA on CPAP G47.33     Z99.89    2. Primary insomnia F51.01    3. Chronic pain of right knee M25.561 XR KNEE RIGHT STANDING AP/LAT WITH SUNRISE VIEW    G89.29 Refer to [facility] [place]     tiZANidine (ZANAFLEX) 4 mg Oral Tablet   4. Essential hypertension I10    5. Mixed hyperlipidemia E78.2    6.  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  OSA on CPAP/primary insomnia will continue his current CPAP at the current levels and will continue the trazodone he is doing much better.  2. Chronic pain of the right knee will get x-rays and refer him to Orthopedics did do a prescription for tizanidine explained to him that I did not write for Flexeril.  Also discussed with him the need to see pain management if he is willing narcotic medications he declined this time.  Will use over-the-counter Biofreeze over the knee as well.  Also discussed with him not to take the NSAID due to his warfarin use and to take extra-strength Tylenol.  3. Essential hypertension stable continue his current medications  4. Mixed hyperlipidemia stable will continue the low-fat diet and the statin he will follow up with Cardiology as directed.  5. 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  Discussed the signs and symptoms of skin cancer as well as consistent use of sunscreen.  Return in about 6 months (around 02/04/2018).    [name], MD  08/05/2017, 10:46

## 2017-08-07 NOTE — Progress Notes (Signed)
Left message for pt to callback regarding Xray results.  [name], [place]  08/07/2017, 10:01

## 2017-08-07 NOTE — Progress Notes (Signed)
Spoke to pt and daughter, advised of Xray results.showing mild arthritis needs to see ortho. Pt voiced understanding.  [name], [place]  08/07/2017, 10:16

## 2017-08-11 ENCOUNTER — Other Ambulatory Visit: Payer: Self-pay

## 2017-08-11 ENCOUNTER — Observation Stay
Admission: EM | Admit: 2017-08-11 | Discharge: 2017-08-12 | Disposition: A | Discharge status: Home / Self Care | Payer: Medicare Other | Attending: Family Medicine | Admitting: Family Medicine

## 2017-08-11 ENCOUNTER — Emergency Department (EMERGENCY_DEPARTMENT_HOSPITAL): Payer: Medicare Other

## 2017-08-11 ENCOUNTER — Emergency Department (EMERGENCY_DEPARTMENT_HOSPITAL): Payer: Medicare Other | Admitting: UHP RADIOLOGY

## 2017-08-11 DIAGNOSIS — F419 Anxiety disorder, unspecified: Secondary | ICD-10-CM | POA: Diagnosis not present

## 2017-08-11 DIAGNOSIS — Z7901 Long term (current) use of anticoagulants: Secondary | ICD-10-CM | POA: Diagnosis not present

## 2017-08-11 DIAGNOSIS — M25561 Pain in right knee: Secondary | ICD-10-CM | POA: Diagnosis not present

## 2017-08-11 DIAGNOSIS — G4700 Insomnia, unspecified: Secondary | ICD-10-CM | POA: Diagnosis not present

## 2017-08-11 DIAGNOSIS — Z8042 Family history of malignant neoplasm of prostate: Secondary | ICD-10-CM | POA: Diagnosis not present

## 2017-08-11 DIAGNOSIS — R262 Difficulty in walking, not elsewhere classified: Secondary | ICD-10-CM | POA: Diagnosis not present

## 2017-08-11 DIAGNOSIS — Z952 Presence of prosthetic heart valve: Secondary | ICD-10-CM | POA: Diagnosis not present

## 2017-08-11 DIAGNOSIS — Z87442 Personal history of urinary calculi: Secondary | ICD-10-CM | POA: Diagnosis not present

## 2017-08-11 DIAGNOSIS — Z8249 Family history of ischemic heart disease and other diseases of the circulatory system: Secondary | ICD-10-CM | POA: Diagnosis not present

## 2017-08-11 DIAGNOSIS — E669 Obesity, unspecified: Secondary | ICD-10-CM | POA: Diagnosis not present

## 2017-08-11 DIAGNOSIS — G40909 Epilepsy, unspecified, not intractable, without status epilepticus: Secondary | ICD-10-CM | POA: Diagnosis not present

## 2017-08-11 DIAGNOSIS — G4733 Obstructive sleep apnea (adult) (pediatric): Secondary | ICD-10-CM | POA: Diagnosis not present

## 2017-08-11 DIAGNOSIS — E871 Hypo-osmolality and hyponatremia: Secondary | ICD-10-CM | POA: Diagnosis not present

## 2017-08-11 DIAGNOSIS — F17210 Nicotine dependence, cigarettes, uncomplicated: Secondary | ICD-10-CM | POA: Diagnosis not present

## 2017-08-11 DIAGNOSIS — M11261 Other chondrocalcinosis, right knee: Secondary | ICD-10-CM | POA: Diagnosis not present

## 2017-08-11 DIAGNOSIS — Z885 Allergy status to narcotic agent status: Secondary | ICD-10-CM | POA: Diagnosis not present

## 2017-08-11 DIAGNOSIS — G40309 Generalized idiopathic epilepsy and epileptic syndromes, not intractable, without status epilepticus: Secondary | ICD-10-CM | POA: Diagnosis not present

## 2017-08-11 DIAGNOSIS — T50901A Poisoning by unspecified drugs, medicaments and biological substances, accidental (unintentional), initial encounter: Secondary | ICD-10-CM | POA: Diagnosis not present

## 2017-08-11 DIAGNOSIS — T48201A Poisoning by unspecified drugs acting on muscles, accidental (unintentional), initial encounter: Secondary | ICD-10-CM

## 2017-08-11 DIAGNOSIS — M1711 Unilateral primary osteoarthritis, right knee: Secondary | ICD-10-CM | POA: Diagnosis not present

## 2017-08-11 DIAGNOSIS — T391X2A Poisoning by 4-Aminophenol derivatives, intentional self-harm, initial encounter: Secondary | ICD-10-CM | POA: Diagnosis not present

## 2017-08-11 DIAGNOSIS — T481X1A Poisoning by skeletal muscle relaxants [neuromuscular blocking agents], accidental (unintentional), initial encounter: Secondary | ICD-10-CM | POA: Diagnosis not present

## 2017-08-11 DIAGNOSIS — Z9049 Acquired absence of other specified parts of digestive tract: Secondary | ICD-10-CM | POA: Diagnosis not present

## 2017-08-11 DIAGNOSIS — R001 Bradycardia, unspecified: Secondary | ICD-10-CM | POA: Diagnosis not present

## 2017-08-11 DIAGNOSIS — R4182 Altered mental status, unspecified: Secondary | ICD-10-CM | POA: Diagnosis not present

## 2017-08-11 DIAGNOSIS — Z825 Family history of asthma and other chronic lower respiratory diseases: Secondary | ICD-10-CM | POA: Diagnosis not present

## 2017-08-11 DIAGNOSIS — M25861 Other specified joint disorders, right knee: Secondary | ICD-10-CM | POA: Diagnosis not present

## 2017-08-11 DIAGNOSIS — Z7982 Long term (current) use of aspirin: Secondary | ICD-10-CM | POA: Diagnosis not present

## 2017-08-11 DIAGNOSIS — R791 Abnormal coagulation profile: Secondary | ICD-10-CM | POA: Diagnosis not present

## 2017-08-11 DIAGNOSIS — M1990 Unspecified osteoarthritis, unspecified site: Secondary | ICD-10-CM | POA: Diagnosis not present

## 2017-08-11 DIAGNOSIS — Z79899 Other long term (current) drug therapy: Secondary | ICD-10-CM | POA: Diagnosis not present

## 2017-08-11 DIAGNOSIS — Z6838 Body mass index (BMI) 38.0-38.9, adult: Secondary | ICD-10-CM | POA: Diagnosis not present

## 2017-08-11 DIAGNOSIS — T428X2A Poisoning by antiparkinsonism drugs and other central muscle-tone depressants, intentional self-harm, initial encounter: Secondary | ICD-10-CM | POA: Diagnosis not present

## 2017-08-11 DIAGNOSIS — Z96652 Presence of left artificial knee joint: Secondary | ICD-10-CM | POA: Diagnosis not present

## 2017-08-11 DIAGNOSIS — G40409 Other generalized epilepsy and epileptic syndromes, not intractable, without status epilepticus: Secondary | ICD-10-CM

## 2017-08-11 DIAGNOSIS — Z5181 Encounter for therapeutic drug level monitoring: Secondary | ICD-10-CM

## 2017-08-11 DIAGNOSIS — Z720 Tobacco use: Secondary | ICD-10-CM | POA: Diagnosis present

## 2017-08-11 DIAGNOSIS — M2550 Pain in unspecified joint: Secondary | ICD-10-CM | POA: Diagnosis present

## 2017-08-11 HISTORY — DX: Poisoning by unspecified drugs acting on muscles, accidental (unintentional), initial encounter: T48.201A

## 2017-08-11 LAB — COMPREHENSIVE METABOLIC PROFILE - BMC/JMC ONLY
ALBUMIN/GLOBULIN RATIO: 0.8 (ref 0.8–2.0)
ALBUMIN/GLOBULIN RATIO: 0.8 (ref 0.8–2.0)
ALBUMIN: 2.7 g/dL — ABNORMAL LOW (ref 3.5–5.0)
ALKALINE PHOSPHATASE: 173 U/L — ABNORMAL HIGH (ref 38–126)
ALT (SGPT): 38 U/L (ref 17–63)
ANION GAP: 7 mmol/L (ref 3–11)
AST (SGOT): 58 U/L — ABNORMAL HIGH (ref 15–41)
BILIRUBIN TOTAL: 0.6 mg/dL (ref 0.3–1.2)
BUN/CREA RATIO: 20 (ref 6–22)
BUN/CREA RATIO: 20 (ref 6–22)
BUN: 26 mg/dL — ABNORMAL HIGH (ref 6–20)
CALCIUM: 8.2 mg/dL — ABNORMAL LOW (ref 8.8–10.2)
CALCIUM: 8.2 mg/dL — ABNORMAL LOW (ref 8.8–10.2)
CHLORIDE: 97 mmol/L — ABNORMAL LOW (ref 101–111)
CO2 TOTAL: 23 mmol/L (ref 22–32)
CREATININE: 1.27 mg/dL — ABNORMAL HIGH (ref 0.61–1.24)
ESTIMATED GFR: 57 mL/min/{1.73_m2} — ABNORMAL LOW (ref >60)
GLUCOSE: 169 mg/dL — ABNORMAL HIGH (ref 70–110)
POTASSIUM: 4.8 mmol/L (ref 3.4–5.1)
PROTEIN TOTAL: 6.2 g/dL — ABNORMAL LOW (ref 6.4–8.3)
SODIUM: 127 mmol/L — ABNORMAL LOW (ref 136–145)

## 2017-08-11 LAB — CBC WITH DIFF
BASOPHIL #: 0.1 x10ˆ3/uL (ref 0.00–0.10)
BASOPHIL %: 1 % (ref 0–3)
EOSINOPHIL #: 0.1 x10ˆ3/uL (ref 0.00–0.50)
EOSINOPHIL %: 2 % (ref 0–5)
HCT: 37.1 % — ABNORMAL LOW (ref 40.0–50.0)
HGB: 12 g/dL — ABNORMAL LOW (ref 13.5–18.0)
LYMPHOCYTE #: 1.4 10*3/uL (ref 1.00–4.80)
LYMPHOCYTE #: 1.4 x10ˆ3/uL (ref 1.00–4.80)
LYMPHOCYTE %: 17 % (ref 15–43)
MCH: 29.2 pg (ref 27.5–33.2)
MCHC: 32.4 g/dL (ref 32.0–36.0)
MCV: 90 fL (ref 82.0–97.0)
MONOCYTE #: 0.9 x10ˆ3/uL (ref 0.20–0.90)
MONOCYTE %: 11 % (ref 5–12)
MPV: 11 fL — ABNORMAL HIGH (ref 7.4–10.5)
NEUTROPHIL #: 5.9 x10ˆ3/uL (ref 1.50–6.50)
NEUTROPHIL %: 70 % (ref 43–76)
PLATELETS: 121 x10ˆ3/uL — ABNORMAL LOW (ref 150–450)
RBC: 4.13 x10ˆ6/uL — ABNORMAL LOW (ref 4.40–5.80)
RDW: 16 % (ref 11.0–16.0)
WBC: 8.5 x10ˆ3/uL (ref 4.0–11.0)

## 2017-08-11 LAB — DRUG SCREEN,URINE - BMC/JMC ONLY
AMPHETAMINES URINE: NEGATIVE
AMPHETAMINES URINE: NEGATIVE
BARBITURATES URINE: NEGATIVE
BENZODIAZEPINES URINE: NEGATIVE
BENZODIAZEPINES URINE: NEGATIVE
CANNABINOIDS URINE: NEGATIVE
COCAINE METABOLITES URINE: NEGATIVE
METHADONE URINE: NEGATIVE
OPIATES URINE: NEGATIVE
OXYCODONE URINE: NEGATIVE
PCP URINE: NEGATIVE
TRICYCLIC ANTIDEPRESSANTS URINE: NEGATIVE

## 2017-08-11 LAB — TROPONIN-I: TROPONIN I: <0.02 ng/mL (ref 0.00–0.06)

## 2017-08-11 LAB — URINALYSIS, MACROSCOPIC
BILIRUBIN: NEGATIVE mg/dL
LEUKOCYTES: NEGATIVE WBCs/uL
PH: 6 (ref 5.0–7.5)
PROTEIN: 30 mg/dL — AB
SPECIFIC GRAVITY: 1.01 (ref 1.005–1.020)

## 2017-08-11 LAB — URINALYSIS, MICROSCOPIC

## 2017-08-11 LAB — PT/INR: PROTHROMBIN TIME: 14.5 seconds — ABNORMAL HIGH (ref 9.2–12.3)

## 2017-08-11 LAB — POC FINGERSTICK GLUCOSE - BMC/JMC (RESULTS): GLUCOSE, POC: 162 mg/dl — ABNORMAL HIGH (ref 60–100)

## 2017-08-11 LAB — OSMOLALITY, RANDOM URINE: OSMOLALITY URINE: 283 mOsm/kg (ref 50–1200)

## 2017-08-11 LAB — ETHANOL, SERUM: ETHANOL: NOT DETECTED

## 2017-08-11 LAB — SODIUM, RANDOM URINE: SODIUM RANDOM URINE: 19 mmol/L

## 2017-08-11 LAB — ACETAMINOPHEN LEVEL: ACETAMINOPHEN LEVEL: 42 ug/mL — ABNORMAL HIGH (ref 10–30)

## 2017-08-11 MED ORDER — DEXTROSE 5 % IN WATER (D5W) INTRAVENOUS SOLUTION
10000.0000 mg | Freq: Once | INTRAVENOUS | Status: DC
Start: 2017-08-11 — End: 2017-08-12
  Administered 2017-08-11: 10000 mg via INTRAVENOUS
  Administered 2017-08-12: 0 mg via INTRAVENOUS
  Filled 2017-08-11: qty 50

## 2017-08-11 MED ORDER — WARFARIN 5 MG TABLET
5.00 mg | ORAL_TABLET | ORAL | Status: DC
Start: 2017-08-13 — End: 2017-08-11

## 2017-08-11 MED ORDER — SODIUM CHLORIDE 0.9 % (FLUSH) INJECTION SYRINGE
10.0000 mL | INJECTION | Freq: Three times a day (TID) | INTRAMUSCULAR | Status: DC
Start: 2017-08-11 — End: 2017-08-12
  Administered 2017-08-11 – 2017-08-12 (×3): 0 mL via INTRAVENOUS
  Administered 2017-08-12: 10 mL via INTRAVENOUS

## 2017-08-11 MED ORDER — NICOTINE 21 MG/24 HR DAILY TRANSDERMAL PATCH
21.0000 mg | MEDICATED_PATCH | Freq: Every day | TRANSDERMAL | Status: DC
Start: 2017-08-11 — End: 2017-08-12
  Administered 2017-08-11 – 2017-08-12 (×3): 21 mg via TRANSDERMAL
  Filled 2017-08-11 (×2): qty 1

## 2017-08-11 MED ORDER — DEXTROSE 5 % IN WATER (D5W) INTRAVENOUS SOLUTION
15000.0000 mg | Freq: Once | INTRAVENOUS | Status: AC
Start: 2017-08-11 — End: 2017-08-11
  Administered 2017-08-11: 15000 mg via INTRAVENOUS
  Administered 2017-08-11: 0 mg via INTRAVENOUS
  Filled 2017-08-11: qty 75

## 2017-08-11 MED ORDER — WARFARIN 2.5 MG TABLET
7.50 mg | ORAL_TABLET | ORAL | Status: AC
Start: 2017-08-11 — End: 2017-08-11
  Administered 2017-08-11: 7.5 mg via ORAL
  Filled 2017-08-11: qty 1

## 2017-08-11 MED ORDER — ATORVASTATIN 10 MG TABLET
10.00 mg | ORAL_TABLET | Freq: Every evening | ORAL | Status: DC
Start: 2017-08-11 — End: 2017-08-12
  Administered 2017-08-11 (×2): 10 mg via ORAL
  Filled 2017-08-11: qty 1

## 2017-08-11 MED ORDER — MELATONIN 3 MG TABLET
3.00 mg | ORAL_TABLET | Freq: Every evening | ORAL | Status: DC | PRN
Start: 2017-08-11 — End: 2017-08-12
  Administered 2017-08-11 (×2): 3 mg via ORAL
  Filled 2017-08-11 (×2): qty 1

## 2017-08-11 MED ORDER — DEXTROSE 5 % IN WATER (D5W) INTRAVENOUS SOLUTION
5000.0000 mg | Freq: Once | INTRAVENOUS | Status: AC
Start: 2017-08-11 — End: 2017-08-11
  Administered 2017-08-11: 19:00:00 0 mg via INTRAVENOUS
  Administered 2017-08-11: 5000 mg via INTRAVENOUS
  Administered 2017-08-11: 0 mg via INTRAVENOUS
  Filled 2017-08-11: qty 25

## 2017-08-11 MED ORDER — LIDOCAINE 4 % TOPICAL PATCH
1.0000 | MEDICATED_PATCH | Freq: Every day | CUTANEOUS | Status: DC
Start: 2017-08-11 — End: 2017-08-12
  Administered 2017-08-11 – 2017-08-12 (×2): 1 via TRANSDERMAL
  Filled 2017-08-11: qty 1

## 2017-08-11 MED ORDER — SODIUM CHLORIDE 0.9 % IV BOLUS
1000.00 mL | INJECTION | Status: AC
Start: 2017-08-11 — End: 2017-08-11

## 2017-08-11 MED ORDER — WARFARIN 2.5 MG TABLET
7.50 mg | ORAL_TABLET | ORAL | Status: DC
Start: 2017-08-12 — End: 2017-08-12

## 2017-08-11 MED ORDER — SODIUM CHLORIDE 0.9 % (FLUSH) INJECTION SYRINGE
2.5000 mL | INJECTION | Freq: Three times a day (TID) | INTRAMUSCULAR | Status: DC
Start: 2017-08-11 — End: 2017-08-11

## 2017-08-11 MED ORDER — LEVETIRACETAM 250 MG TABLET
1000.00 mg | ORAL_TABLET | Freq: Two times a day (BID) | ORAL | Status: DC
Start: 2017-08-11 — End: 2017-08-12
  Administered 2017-08-11 – 2017-08-12 (×2): 1000 mg via ORAL
  Filled 2017-08-11 (×2): qty 4

## 2017-08-11 MED ORDER — ENOXAPARIN 40 MG/0.4 ML SUB-Q SYRINGE - EAST
40.0000 mg | INJECTION | Freq: Every day | SUBCUTANEOUS | Status: DC
Start: 2017-08-11 — End: 2017-08-12
  Administered 2017-08-11 – 2017-08-12 (×2): 40 mg via SUBCUTANEOUS
  Filled 2017-08-11 (×2): qty 0.4

## 2017-08-11 MED ORDER — TRAZODONE 50 MG TABLET
100.00 mg | ORAL_TABLET | ORAL | Status: AC
Start: 2017-08-12 — End: 2017-08-11
  Administered 2017-08-11: 100 mg via ORAL
  Filled 2017-08-11: qty 2

## 2017-08-11 MED ORDER — SODIUM CHLORIDE 0.9 % IV BOLUS
1500.0000 mL | INJECTION | Status: AC
Start: 2017-08-11 — End: 2017-08-11
  Administered 2017-08-11: 1500 mL via INTRAVENOUS
  Administered 2017-08-11: 0 mL via INTRAVENOUS

## 2017-08-11 MED ADMIN — sodium chloride 0.9 % intravenous solution: INTRAVENOUS | @ 10:00:00

## 2017-08-11 MED ADMIN — sodium chloride 0.9 % intravenous solution: INTRAVENOUS | @ 16:00:00 | NDC 00338004904

## 2017-08-11 NOTE — Nurses Notes (Signed)
Bedside report given to [facility], RN assuming care of patient. All questions answered. IV fluids checked and IV site assessed, no s/s infiltration noted at this time. Patient resting in bed without complaint. Call bell within reach.

## 2017-08-11 NOTE — ED Nurses Note (Signed)
This nurse assisted patient to restroom via wheelchair. Patient steady on feet. Vitals slightly improved. Requesting something to eat- will order meal tray after verifying its okay w provider.

## 2017-08-11 NOTE — Student (Signed)
FAMILY MEDICINE  History & Physical       Name: [name], [name], 67 y.o. male Date of Service:  08/11/2017   Date of Birth:  [birth year]  MRN: [id number] Date of Admission:  08/11/2017   PCP: [name], MD LOS: 0    Room: 02/02   Attending: Roxanna Mew , MD   Information Obtained from: Patient  Code Status: FULL Chief Complaint: Dizzy s/p muscle relaxer OD  Admitted for: Muscle relaxer (Zanaflex) OD     HISTORY of PRESENTING ILLNESS:   [name] is a 67 y.o. male with osteoarthritis, epilepsy, aortic valve disease and hepatitis C who is being admitted to [facility] after he presented to the ED complaining of dizziness soon after he took "50" pills of Zanaflex (Tizanidine) after breakfast this morning. Patient reports that he has arthritis in his knees and has been unable to sleep for over 48 hours secondary to the pain. He was prescribed Tizanidine 4 mg TID PO by his PCP, and that dose was not relieving him. He reports trying more pills (5-6) than prescribed, with still no relief. This morning he was fed up and decided to take around 50 pills. The only symptoms the patient is experiencing are dizziness and "seeing things" only when he closes his eyes. Patient does report that the pain is better at this time. Patient also reports taking either Acetaminophen or Naproxen (2 tabs) this morning, but is unsure which one. Otherwise: (-) fevers, (-) chills, (-) chest pain, (-) nausea, (-) vomiting, (-) shortness of breath, (-) abdominal pain, (-) trauma.      ED Course:   In the ER, vitals on presentation were T: 37.1, HR: 48, BP: 73/38, RR: 14, SpO2: 97%. Since then vitals have improved significantly T: 36.7, HR 59, BP 99/84, RR 15, SpO2 98%. Patient was put on cardiac monitor, had CBC, CMP, EKG, Knee XR, cardiac enzymes, PT/INR, and toxicity levels. Acetaminophen level at 42. Sodium 127. Creatinine 1.27. INR Subtherapeutic at 1.28. Troponin negative. R Knee XR showed mild osteoarthritic changes, as well as  chondrocalcinosis and atherosclerosis. Case discussed with poison control. Recommended start patient on N-Acetylcysteine for elevated acetaminophen, and trend acetaminophen level. Patient admitted for further observation, evaluation, and treatment if necessary.       MEDICAL HISTORY:    PAST MEDICAL & SURGICAL HISTORIES:   Past Medical History:   Diagnosis Date   . Abnormal EKG    . Anxiety    . Aortic valve replaced    . Bladder stone    . Depression    . Diverticulitis    . Kidney stones    . Rectal bleeding    . Seizures    . Viral hepatitis C    . Wears glasses     Past Surgical History:   Procedure Laterality Date   . HX AORTIC VALVE REPLACEMENT     . HX CHOLECYSTECTOMY     . HX COLECTOMY  2010    at [facility] for diverticulitis.   Marland Kitchen HX COLONOSCOPY     . HX KNEE REPLACMENT Left         HOME MEDICATIONS:  Outpatient Medications Marked as Taking for the 08/11/17 encounter [facility] Encounter)   Medication Sig   . aspirin (ASPIR-81 ORAL) Take by mouth   . atorvastatin (LIPITOR) 10 mg Oral Tablet Take 1 Tab (10 mg total) by mouth Once a day   . carvedilol (COREG) 3.125 mg Oral Tablet Take 1 Tab (3.125 mg  total) by mouth Twice daily with food   . escitalopram oxalate (LEXAPRO) 20 mg Oral Tablet Take 1 Tab (20 mg total) by mouth Once a day   . levETIRAcetam (KEPPRA) 1,000 mg Oral Tablet Take 1 Tab (1,000 mg total) by mouth Twice daily   . ramipril (ALTACE) 5 mg Oral Capsule Take 1 Cap (5 mg total) by mouth Once a day   . raNITIdine (ZANTAC) 150 mg Oral Tablet Take 1 Tab (150 mg total) by mouth Twice daily   . tamsulosin (FLOMAX) 0.4 mg Oral Capsule Take 1 Cap (0.4 mg total) by mouth Every evening after dinner   . tiZANidine (ZANAFLEX) 4 mg Oral Tablet Take 1 Tab (4 mg total) by mouth Three times a day as needed   . warfarin (COUMADIN) 5 mg Oral Tablet Take 1 Tab (5 mg total) by mouth Every Monday, Wednesday and Friday   . warfarin (COUMADIN) 7.5 mg Oral Tablet Take 1 Tab (7.5 mg total) by mouth Every Sunday,  Tuesday, Thursday and Saturday        ALLERGIES:  He is allergic to morphine.     FAMILY HISTORY:  His family history includes COPD in his mother; Heart Disease in his father; Heart Surgery in his father; No Known Problems in his brother, daughter, maternal grandfather, maternal grandmother, maternal uncle, paternal aunt, paternal grandfather, paternal grandmother, paternal uncle, sister, and son; Prostate Cancer (age of onset: 24) in his father.    SOCIAL HISTORY:  He  reports that he does not drink alcohol. He  reports that he has been smoking.  He has been smoking about 1.00 pack per day. He has never used smokeless tobacco. He  reports that he does not use drugs.      Patient is married, but is currently living alone while his wife takes care of her parents elsewhere. His daughter in [place] helps him with all of his medical affairs.       Patient currently retired. used to work as a Nurse, adult at [employer]:      Constitutional: Positive dizziness. negative for fevers, chills, sweats and fatigue   Skin: No rashes   Eyes: negative for visual disturbance and redness   Ears, nose, mouth, throat, and face: negative for hearing loss, tinnitus, ear drainage, nasal congestion and epistaxis   Respiratory: negative for cough, hemoptysis or wheezing   Cardiovascular: negative for chest pain, dyspnea, palpitations, orthopnea and paroxysmal nocturnal dyspnea   Gastrointestinal: negative for nausea, vomiting and change in bowel habits   Genitourinary:negative for frequency and hematuria   Musculoskeletal:negative weakness   Neurological: negative for numbness or tingling   Behavioral/Psych: Positive for visual hallucinations only when he closes eyes. negative for depression or suicidal ideations.    Endocrine: negative   Allergic/Immunologic: negative    EXAMINATION:  Temperature: 36.7 C (98 F) Heart Rate: 54 BP (Non-Invasive): 99/84   Respiratory Rate: 15 SpO2: 97 % Pain Score (Numeric,  Faces): 0     General: Patient is awake, alert, and in no acute distress.  Eyes: Pupils equal and round, reactive to light and accomodation. , Conjunctivae/corneas clear, EOM's intact, no nystagmus.   HENT: Normocephalic, atraumatic.  Mouth mucous membranes moist. poor dentition with multiple missing teeth. Pharynx without injection or exudate, no oral lesions.   Neck: supple, no lymphadenopathy   Lungs: clear to auscultation bilaterally.   Cardiovascular:  Sinus brady, mechanical clicking secondary to aortic valve replacement, no rub or gallop  Abdomen: soft, non-tender, non-distended, no organomegaly, no masses and no hernias  GU: deferred  Extremities: no edema, redness or tenderness in the calves or thighs, no varicosities  Skin: Skin color, texture, turgor normal. No rashes or lesions  Neurologic: gait is unstable secondary to knee pain, DTR intact bilaterally. Alert and oriented x3  Lymphatics: no lymphadenopathy  Psychiatric: affect normal, behavior normal, thought content normal, judgement normal       LABS:      Lab Results for Last 24 Hours:    Results for orders placed or performed during the hospital encounter of 08/11/17 (from the past 24 hour(s))   ECG 12-LEAD   Result Value Ref Range    Ventricular rate 48 BPM    Atrial Rate 48 BPM    PR Interval 184 ms    QRS Duration 72 ms    QT Interval 480 ms    QTC Calculation 428 ms    Calculated P Axis   degrees    Calculated R Axis -33 degrees    Calculated T Axis -22 degrees   POC FINGERSTICK GLUCOSE - [facility] (RESULTS)   Result Value Ref Range    GLUCOSE, POC 162 (H) 60 - 100 mg/dl   COMPREHENSIVE METABOLIC PROFILE - [facility] ONLY   Result Value Ref Range    SODIUM 127 (L) 136 - 145 mmol/L    POTASSIUM 4.8 3.4 - 5.1 mmol/L    CHLORIDE 97 (L) 101 - 111 mmol/L    CO2 TOTAL 23 22 - 32 mmol/L    ANION GAP 7 3 - 11 mmol/L    BUN 26 (H) 6 - 20 mg/dL    CREATININE 1.27 (H) 0.61 - 1.24 mg/dL    BUN/CREA RATIO 20 6 - 22    ESTIMATED GFR 57 (L) >60 mL/min/1.58m^2     ALBUMIN 2.7 (L) 3.5 - 5.0 g/dL    CALCIUM 8.2 (L) 8.8 - 10.2 mg/dL    GLUCOSE 169 (H) 70 - 110 mg/dL    ALKALINE PHOSPHATASE 173 (H) 38 - 126 U/L    ALT (SGPT) 38 17 - 63 U/L    AST (SGOT) 58 (H) 15 - 41 U/L    BILIRUBIN TOTAL 0.6 0.3 - 1.2 mg/dL    PROTEIN TOTAL 6.2 (L) 6.4 - 8.3 g/dL    ALBUMIN/GLOBULIN RATIO 0.8 0.8 - 2.0   ETHANOL, SERUM   Result Value Ref Range    ETHANOL Not Detected    PT/INR   Result Value Ref Range    PROTHROMBIN TIME 14.5 (H) 9.2 - 12.3 seconds    INR 1.28    TROPONIN-I   Result Value Ref Range    TROPONIN I <0.02 0.00 - 0.06 ng/mL   Acetaminophen Level   Result Value Ref Range    ACETAMINOPHEN LEVEL 42 (H) 10 - 30 ug/mL   Salicylate Acid Level   Result Value Ref Range    SALICYLATE LEVEL <4 (L) 4 - 30 mg/dL   CBC WITH DIFF   Result Value Ref Range    WBC 8.5 4.0 - 11.0 x10^3/uL    RBC 4.13 (L) 4.40 - 5.80 x10^6/uL    HGB 12.0 (L) 13.5 - 18.0 g/dL    HCT 37.1 (L) 40.0 - 50.0 %    MCV 90.0 82.0 - 97.0 fL    MCH 29.2 27.5 - 33.2 pg    MCHC 32.4 32.0 - 36.0 g/dL    RDW 16.0 11.0 - 16.0 %    PLATELETS 121 (  L) 150 - 450 x10^3/uL    MPV 11.0 (H) 7.4 - 10.5 fL    NEUTROPHIL % 70 43 - 76 %    LYMPHOCYTE % 17 15 - 43 %    MONOCYTE % 11 5 - 12 %    EOSINOPHIL % 2 0 - 5 %    BASOPHIL % 1 0 - 3 %    NEUTROPHIL # 5.90 1.50 - 6.50 x10^3/uL    LYMPHOCYTE # 1.40 1.00 - 4.80 x10^3/uL    MONOCYTE # 0.90 0.20 - 0.90 x10^3/uL    EOSINOPHIL # 0.10 0.00 - 0.50 x10^3/uL    BASOPHIL # 0.10 0.00 - 0.10 x10^3/uL   DRUG SCREEN,URINE,RAPID - [facility] ONLY   Result Value Ref Range    AMPHETAMINES URINE Negative Negative    BARBITURATES URINE Negative Negative    BENZODIAZEPINES URINE Negative Negative    PCP URINE Negative Negative    METHADONE URINE Negative Negative    OPIATES URINE Negative Negative    CANNABINOIDS URINE Negative Negative    COCAINE METABOLITES URINE Negative Negative    OXYCODONE URINE Negative Negative    TRICYCLIC ANTIDEPRESSANTS URINE Negative Negative   URINALYSIS, MACROSCOPIC   Result Value  Ref Range    COLOR Yellow Yellow    APPEARANCE Clear Clear    PH 6.0 5.0 - 7.5    LEUKOCYTES Negative Negative WBCs/uL    NITRITE Negative Negative    PROTEIN 30  (A) Negative mg/dL    GLUCOSE 50  (A) Normal mg/dL    KETONES Negative Negative mg/dL    UROBILINOGEN < 1 <=2.0 mg/dL    BILIRUBIN Negative Negative mg/dL    BLOOD 3+ (A) Negative mg/dL    SPECIFIC GRAVITY 1.010 1.005 - 1.020   URINALYSIS, MICROSCOPIC   Result Value Ref Range    RBCS 3-5 (A) 0-2, None /hpf    WBCS 0-2 0-2, 3-5, None /hpf    BACTERIA None None /hpf    SQUAMOUS EPITHELIAL 0-2 0-2, None /hpf    MUCOUS Present (A) None /hpf    HYALINE CASTS 0-2 None, 0-2 /lpf   Acetaminophen Level   Result Value Ref Range    ACETAMINOPHEN LEVEL 19 10 - 30 ug/mL         IMAGING STUDIES:    Results for orders placed or performed during the hospital encounter of 08/11/17 (from the past 24 hour(s))   XR CHEST AP PORTABLE     Status: None    Narrative    ABHI MOCCIA Hinkley  Male, 67 years old.    XR CHEST AP PORTABLE performed on 08/11/2017 11:52 AM.    REASON FOR EXAM:  overdose    COMPARISON: January 23, 2015    TECHNIQUE: Single view of chest.    FINDINGS:   The patient is status post median sternotomy. The lungs are adequately  inflated. There is no evidence of pneumothorax, pleural effusion or  concerning focal airspace opacity. The cardiac silhouette is stable in size  given differences in technique and patient position. The pulmonary  vasculature is normal in course and caliber. There is normal distal left  clavicle fracture.      Impression    No evidence of acute cardiopulmonary disease.        Radiologist location ID: [id number]     XR KNEE RIGHT AP, LAT & 1 OBLIQUE     Status: None    Narrative    DONTERRIUS SANTUCCI Slivka  Male, 67 years old.  XR KNEE RIGHT AP, LAT & 1 OBLIQUE performed on 08/11/2017 11:52 AM.    REASON FOR EXAM:  right knee pain    COMPARISON: None available    TECHNIQUE: AP, lateral and oblique views of the right kidney    FINDINGS:  There is normal  bony mineralization. There is normal bony alignment. Thin  calcific densities are redemonstrated within the medial and lateral  compartments. Small marginal osteophytes are redemonstrated at the medial  and lateral tibial plateaus as well as around the patella. No acute  fracture lines identified. Mural vascular calcifications are seen within  the posterior soft tissues.      Impression    1. Chondrocalcinosis.  2. Atherosclerosis.  3. Stable mild osteoarthritic degenerative changes.         Radiologist location ID: [id number]         EKG:    Most Recent EKG This Encounter   ECG 12-LEAD    Collection Time: 08/11/17  9:09 AM   Result Value    Ventricular rate 48    Atrial Rate 48    PR Interval 184    QRS Duration 72    QT Interval 480    QTC Calculation 428    Calculated P Axis      Calculated R Axis -33    Calculated T Axis -22    Narrative    Marked sinus bradycardia  Left axis deviation  T wave abnormality, consider lateral ischemia  Abnormal ECG  When compared with ECG of 02-Dec-2012 10:36,  Criteria for Septal infarct are no longer Present  T wave inversion now evident in Inferior leads  T wave inversion now evident in Anterolateral leads         ASSESSMENT:  ESSIE GEHRET is a 67 y.o. male with arthritis, epilepsy and aortic valve disease who presents with dizziness s/p Zanaflex overdose. He was admitted for further observation and treatment if need be. Started on N-acetylcysteine for acetaminophen level in ED.     Active Hospital Problems:  - Overdose muscle relaxant  - Subtherapeutic warfarin therapy  - Hyponatremia  - AKI  - Elevated Acetaminophen level  - Osteoarthritis R knee  - Hyperglycemia      - Overdose muscle relaxant   - Patient reports to taking around 50 pills of Zanaflex. The peak levels of this drug are around 2-3 hours after ingestion. When I saw the patient it had been closer to 4 hours after ingestion. Heart rate had already increased to 59. Patient will likely not develop any serious adverse  effects from this ingestion, and the levels should be trending downward. Regardless, patient is admitted for further observation to make sure that he does not develop any serious adverse effects. Poison control did not recommend further interventions at this time.     - Subtherapeutic warfarin therapy   - Patient has a subtherapeutic INR of 1.28. With a mechanical aortic valve in place, his level should be between 2.5 and 3.5. Current Warfarin therapy is not adequate at this time, or he is not taking the pills as prescribed.    - Hypotonic Hyponatremia   - Sodium level of 127. Will slowly raise this level to within normal limits, and be cautious to avoid central pontine myelinolysis. Try and get patient from 131 to 133 in the next 24 hours. Serum osmolality is 273. Patient is not very hyperglycemic. He falls under hypotonic hyponatremia.  based on exam, he also seems euvolemic. 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 Will need to get urine sodium levels.     - AKI   - Creatinine 1.27. This is not that remarkable, just observe at this time.     - Elevated Acetaminophen level   - Started on N-Acetylcysteine in the ED. Trend acetaminophen levels and observe.    - Osteoarthritis R knee   - Patient's pain is diminishing quality of life. Patient should follow up with orthopedics after discharge for further evaluation. Until then, consider switching from Zanaflex.    - Hyperglycemia   - Patient is mildly hyperglycemic at 169, nonfasting. Will follow this in the morning when fasting, to see if he is prediabetic or diabetic, and go from there.     PLAN:    Admit to telemetry    - Overdose muscle relaxant   - Maintain peripheral IV access.   - Continuous cardiac monitoring in telemetry   - Vitals Q4h    - Subtherapeutic warfarin therapy   - Continue patient on current Warfarin (7.5 mg then 5 mg) therapy for now. Reevaluate tomorrow after repeat INR.    - If INR does not change before discharge, consider increasing dose vs follow up.   -  Enoxaparin DVT prophylaxis    - Hyponatremia   - Oral hydration by mouth at this time. Cautious with too much IV fluids.   - Sodium random urine pending. Will narrow own causes of hyponatremia.    - AKI   - Recheck creatinine in AM    - Elevated Acetaminophen level   - Continue N-Acetylcysteine 5000 mg in D5W at this time. Continue to trend acetaminophen level.     - Osteoarthritis R knee     - Follow with ortho as outpatient.      - Hyperglycemia    - Repeat glucose in the AM to get fasting level.           DVT/PE Prophylaxis - Enoxaparin    Consults - none  Hardware (Lines, Drains, Foley, Tubes) - PIV  Diet - Regular  Activity - Up Ad-Lib  Therapy - None  Disposition Planning - TBD; Likely Home discharge     Patient seen and discussed with attending physician, Dr. Carrie Mew.    Manfred Arch, OMS-III          Thank you for allowing me the opportunity to participate in the assessment and care of this patient.     __________________________________________________________________    I saw and examined the patient and discussed management with the medical student. I reviewed the student's note and agree with the documented findings and plan of care. Please refer to my progress note for further details.    Roxanna Mew, MD 08/12/2017 06:28

## 2017-08-11 NOTE — Nurses Notes (Signed)
Dr. Alam at bedside.

## 2017-08-11 NOTE — Ancillary Notes (Signed)
Pt's BP was 169/94 at 2300 notified [facility] CCT

## 2017-08-11 NOTE — ED Nurses Note (Addendum)
No change in assessment- will initiate every hour. Will continue to monitor vital signs Q 30 minutes.

## 2017-08-11 NOTE — Ancillary Notes (Signed)
PT's BP was 178/83 at 1900 notified [facility] CCT

## 2017-08-11 NOTE — Nurses Notes (Signed)
[name] with Poison Control updated

## 2017-08-11 NOTE — ED Nurses Note (Signed)
Nursing Handoff Report    Reason for Handoff - Patient transferred from ED to inpatient unit    The following was communicated orally to Claiborne Billings, RN for coordination of continuing care:    [name], 67 y.o. male    Attending Provider: Roxanna Mew, MD    Allergies   Allergen Reactions   . Morphine  Other Adverse Reaction (Add comment)     combative       Code Status Information     Code Status    Prior          Active Hospital Problems    Diagnosis   . Overdose of muscle relaxant   . Aortic Mechanical heart valve present       Most recent vital signs - Temperature: 37.1 C (98.7 F)  Heart Rate: 63  BP (Non-Invasive): 139/83  Respiratory Rate: 20  SpO2: 98 %  Pain Score (Numeric, Faces): 0    Pt is not a high fall risk patient.    Medications   NS flush syringe (not administered)   acetylcysteine (ACETADOTE) 5,000 mg in D5W 500 mL infusion (not administered)   acetylcysteine (ACETADOTE) 10,000 mg in D5W 1,000 mL infusion (not administered)   NS bolus infusion 1,500 mL (0 mL Intravenous Stopped 08/11/17 1045)   acetylcysteine (ACETADOTE) 15,000 mg in D5W 200 mL infusion (15,000 mg Intravenous New Bag/New Syringe 08/11/17 1340)       No current outpatient medications on file.       Peripheral IV Right;Upper Cephalic  (lateral side of arm) (Active)   Line Status flushed without difficulty;blood return present;infusing 08/11/2017  2:18 PM   SITE ASSESSMENT WDL 08/11/2017  2:18 PM   Phlebitis Scale 0 08/11/2017  2:18 PM   Infiltration Scale 0 08/11/2017  2:18 PM   DRESSING TYPE Occlusive 08/11/2017  2:18 PM   DRESSING STATUS Clean, Dry and Intact 08/11/2017  2:18 PM   DRESSING DRAINAGE None 08/11/2017  2:18 PM   Daily Site Maintenance & Management Met 08/11/2017  2:18 PM       Abnormal assessments communicated to receiving nurse - Yes     Abnormal labs communicated to receiving nurse - Yes    Abnormal diagnostic exams communicated to receiving nurse - N/A     Home meds with patient (if yes, list specific meds) -  No    Check all pump infusion rates against doctor's order and verify all weight based medication infusing in kilograms communicated to receiving nurse  - Yes     Tracie Harrier, RN

## 2017-08-11 NOTE — ED Nurses Note (Signed)
Received call from poison control with update on condition and lab results. Per poison control, patient should be treated with PO mucomyst (if tolerable) or IV Acetadote due to elevated acetaminophen level. Also reports to be cautious with IVF, vasopressins will work well. Provider aware.

## 2017-08-11 NOTE — ED Nurses Note (Signed)
Spoke with pharmacy and Dr Rigoberto Noel- will initiate Acetadote as ordered. Order for repeat tylenol obtain. Pt unsure whether he was taking tylenol vs naproxen. Unknown ingestion time.

## 2017-08-11 NOTE — ED Nurses Note (Signed)
Dr Rigoberto Noel to bedside for pt evaluation. Contacting poison control at this time.

## 2017-08-11 NOTE — Nurses Notes (Signed)
Patient admitted from ER, anxious and stated "I just want to sleep". Attempted to get Home Medication List from patient, patient stated "I already gave all that information downstairs!"

## 2017-08-11 NOTE — ED Nurses Note (Signed)
IV site infiltrated. 18 G IV removed from LAC, pressure bandage placed with warm compress applied.

## 2017-08-11 NOTE — ED Nurses Note (Signed)
Pt agreeable to one more set of vitals. House supervisor to bedside to greet patient for admission.

## 2017-08-11 NOTE — Nurses Notes (Signed)
Dr Leandrew Koyanagi at bedside

## 2017-08-11 NOTE — ED Nurses Note (Signed)
Pt provided with ice water. Remains on cardiac monitor as well as b/p and spo2. Pt remains alert and oriented.

## 2017-08-11 NOTE — ED Nurses Note (Signed)
Report to Dr. Rigoberto Noel, aware of EKG completed and FSBS 162, also notified of BP 74/48 with IV fluids infusing and BP recycling. Patient placed on cardiac monitoring upon ED arrival.

## 2017-08-11 NOTE — ED Nurses Note (Signed)
Meet and greet done at bedside.

## 2017-08-11 NOTE — ED Nurses Note (Signed)
Pt amb to restroom without distress.

## 2017-08-11 NOTE — ED Nurses Note (Signed)
Care completed and handoff report to [facility], [place]. Patient has IV fluids infusing, confirmed manual BP 74/48. Patient arousable to voice, speaking in full sentences but drifts to sleep at intervals.

## 2017-08-11 NOTE — ED Nurses Note (Signed)
Per poison control- pt bradycardia/ hypotension consistent with Zanaflex OD. Reports supportive care only at this time (IV fluids and monitor). Levophed if pressure does not respond to IVF. Suggests drawing tylenol/ alcohol/ ASA levels. Provider aware.

## 2017-08-11 NOTE — ED Provider Notes (Signed)
[facility]  Emergency Department     HISTORY OF PRESENT ILLNESS     Date:  08/11/2017  Patient's Name:  [name]  Date of Birth:  1950/07/09    HPI   67 y/o male escorted to the ED per EMS for evaluation after an intentional overdose.  Patient states he has history of arthritis and notes he has not slept in 48 hours due to the severity of the pain and decided to take about 50 tablets of Zanaflex. Patient states there were initially 90 tablets in the bottle and had it filled on 08/05/17. Patient denies of any suicidal or homicidal ideation, auditory or visual hallucinations. 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 Patient does not consume alcohol, but does smoke. Patient has history of seizures. . No other complaints.  Review of Systems     Review of Systems   Constitutional: Negative for chills and fever.   HENT: Negative for congestion, sore throat and trouble swallowing.    Respiratory: Negative for cough and shortness of breath.    Cardiovascular: Negative for chest pain, palpitations and leg swelling.   Gastrointestinal: Negative for abdominal pain, diarrhea, nausea and vomiting.   Genitourinary: Negative for dysuria, frequency and urgency.   Musculoskeletal: Negative for back pain and neck pain.   Neurological: Negative for dizziness, weakness, light-headedness, numbness and headaches.   Psychiatric/Behavioral: Negative for confusion and suicidal ideas.   All other systems reviewed and are negative.      Previous History     Past Medical History:  Past Medical History:   Diagnosis Date   . Abnormal EKG    . Anxiety    . Aortic valve replaced    . Bladder stone    . Depression    . Diverticulitis    . Kidney stones    . Rectal bleeding    . Seizures    . Viral hepatitis C    . Wears glasses        Past Surgical History:  Past Surgical History:   Procedure Laterality Date   . Craniotomy for temporal lobectomy Right    . Hx aortic valve replacement     . Hx  cholecystectomy     . Hx colectomy  2010   . Hx colonoscopy     . Hx knee replacment Left        Social History:  Social History     Tobacco Use   . Smoking status: Current Every Day Smoker     Packs/day: 1.00     Years: 40.00     Pack years: 40.00   . Smokeless tobacco: Never Used   Substance Use Topics   . Alcohol use: No     Alcohol/week: 0.0 oz     Comment: denied   . Drug use: No     Social History     Substance and Sexual Activity   Drug Use No       Family History:  Family History   Problem Relation Age of Onset   . COPD Mother    . Heart Surgery Father         CABG   . Prostate Cancer Father 15   . Heart Disease Father    . No Known Problems Sister    . Atrial fibrillation Brother    . No Known Problems Maternal Uncle    . No Known Problems Paternal Aunt    . No Known  Problems Paternal Uncle    . No Known Problems Maternal Grandmother    . No Known Problems Maternal Grandfather    . No Known Problems Paternal Grandmother    . No Known Problems Paternal Grandfather    . No Known Problems Son    . No Known Problems Daughter        Medication History:  Current Outpatient Medications   Medication Sig   . aspirin (ASPIR-81 ORAL) Take by mouth   . atorvastatin (LIPITOR) 10 mg Oral Tablet Take 1 Tab (10 mg total) by mouth Once a day   . carvedilol (COREG) 3.125 mg Oral Tablet Take 1 Tab (3.125 mg total) by mouth Twice daily with food   . diclofenac sodium (VOLTAREN) 1 % Gel by Apply Topically route Three times a day for 30 days   . escitalopram oxalate (LEXAPRO) 20 mg Oral Tablet Take 1 Tab (20 mg total) by mouth Once a day   . levETIRAcetam (KEPPRA) 1,000 mg Oral Tablet Take 1 Tab (1,000 mg total) by mouth Twice daily   . ramipril (ALTACE) 5 mg Oral Capsule Take 1 Cap (5 mg total) by mouth Once a day   . raNITIdine (ZANTAC) 150 mg Oral Tablet Take 1 Tab (150 mg total) by mouth Twice daily   . tamsulosin (FLOMAX) 0.4 mg Oral Capsule Take 1 Cap (0.4 mg total) by mouth Every evening after dinner   . warfarin  (COUMADIN) 5 mg Oral Tablet Take 1 Tab (5 mg total) by mouth Every Monday, Wednesday and Friday   . warfarin (COUMADIN) 7.5 mg Oral Tablet Take 1 Tab (7.5 mg total) by mouth Every Sunday, Tuesday, Thursday and Saturday       Allergies:  Allergies   Allergen Reactions   . Morphine  Other Adverse Reaction (Add comment)     combative       Physical Exam     Vitals:    BP (!) 169/86   Pulse 84   Temp 36.8 C (98.2 F)   Resp (!) 21   Ht 1.803 m (5\' 11" )   Wt 125.8 kg (277 lb 6.4 oz)   SpO2 96%   BMI 38.69 kg/m         Physical Exam   Nursing note and vitals reviewed.  Constitutional:  Well developed, well nourished.  Awake & alert. No distress. Slightly sleepy but appropriate.  Head:  Atraumatic.  Normocephalic.    Eyes:  PERRL.  EOMI.  Conjunctivae are not pale.  ENT:  Mucous membranes are moist and intact.  Oropharynx is clear and symmetric.  Patent airway. Dry mouth.  Neck:  Supple.  Full ROM.  No JVD.  No lymphadenopathy.  Cardiovascular:  Bradycardic rate.  Regular rhythm.  No murmurs, rubs, or gallops.  Distal pulses are 2+ and symmetric.  Pulmonary/Chest:  No evidence of respiratory distress.  Clear to auscultation bilaterally.  No wheezing, rales or rhonchi. Chest non-tender.  Abdominal:  Soft and non-distended.  There is no tenderness.  No rebound, guarding, or rigidity.  No organomegaly.  Good bowel sounds.    Back:  No CVA tenderness. FROM.   Extremities:  No edema.   No cyanosis.  No clubbing.  Full range of motion in all extremities.  No calf tenderness or swelling. Good distal pulses. No redness at the knees.   Skin:  Skin is warm and dry.  No diaphoresis. No rash.   Neurological:  Alert, awake, and appropriate.  Normal speech.  Sensation normal.  Motor strengths 5/5. CN II-XII intact.   Psychiatric:  Good eye contact.  Normal interaction, affect, and behavior.  Diagnostic Studies/Treatment     Medications:  Medications   NS bolus infusion 1,500 mL (0 mL Intravenous Stopped 08/11/17 1045)      acetylcysteine (ACETADOTE) 15,000 mg in D5W 200 mL infusion (0 mg Intravenous Stopped 08/11/17 1440)   acetylcysteine (ACETADOTE) 5,000 mg in D5W 500 mL infusion (0 mg Intravenous Stopped 08/11/17 1921)   warfarin (COUMADIN) tablet 7.5 mg (7.5 mg Oral Given 08/11/17 1848)   NS bolus infusion 1,000 mL (0 mL Intravenous Stopped 08/11/17 1919)   traZODone (DESYREL) tablet (100 mg Oral Given 08/11/17 2345)   zolpidem (AMBIEN) tablet (5 mg Oral Given 08/12/17 0127)   ondansetron (ZOFRAN) 2 mg/mL injection (4 mg Intravenous Given 08/12/17 0400)   metoprolol (LOPRESSOR) 1 mg/mL injection (5 mg Intravenous Given 08/12/17 0416)       Discharge Medication List as of 08/12/2017  3:01 PM      START taking these medications    Details   diclofenac sodium (VOLTAREN) 1 % Gel Apply Topically, 3 TIMES DAILY Starting Tue 08/12/2017, Until Thu 09/11/2017, For 30 days, Disp-100 g, R-0, Print             Labs:    Results for orders placed or performed during the hospital encounter of 08/11/17   COMPREHENSIVE METABOLIC PROFILE - [facility] ONLY   Result Value Ref Range    SODIUM 127 (L) 136 - 145 mmol/L    POTASSIUM 4.8 3.4 - 5.1 mmol/L    CHLORIDE 97 (L) 101 - 111 mmol/L    CO2 TOTAL 23 22 - 32 mmol/L    ANION GAP 7 3 - 11 mmol/L    BUN 26 (H) 6 - 20 mg/dL    CREATININE 1.27 (H) 0.61 - 1.24 mg/dL    BUN/CREA RATIO 20 6 - 22    ESTIMATED GFR 57 (L) >60 mL/min/1.61m^2    ALBUMIN 2.7 (L) 3.5 - 5.0 g/dL    CALCIUM 8.2 (L) 8.8 - 10.2 mg/dL    GLUCOSE 169 (H) 70 - 110 mg/dL    ALKALINE PHOSPHATASE 173 (H) 38 - 126 U/L    ALT (SGPT) 38 17 - 63 U/L    AST (SGOT) 58 (H) 15 - 41 U/L    BILIRUBIN TOTAL 0.6 0.3 - 1.2 mg/dL    PROTEIN TOTAL 6.2 (L) 6.4 - 8.3 g/dL    ALBUMIN/GLOBULIN RATIO 0.8 0.8 - 2.0   ETHANOL, SERUM   Result Value Ref Range    ETHANOL Not Detected    PT/INR   Result Value Ref Range    PROTHROMBIN TIME 14.5 (H) 9.2 - 12.3 seconds    INR 1.28    TROPONIN-I   Result Value Ref Range    TROPONIN I <0.02 0.00 - 0.06 ng/mL   DRUG SCREEN,URINE,RAPID  - [facility] ONLY   Result Value Ref Range    AMPHETAMINES URINE Negative Negative    BARBITURATES URINE Negative Negative    BENZODIAZEPINES URINE Negative Negative    PCP URINE Negative Negative    METHADONE URINE Negative Negative    OPIATES URINE Negative Negative    CANNABINOIDS URINE Negative Negative    COCAINE METABOLITES URINE Negative Negative    OXYCODONE URINE Negative Negative    TRICYCLIC ANTIDEPRESSANTS URINE Negative Negative   Acetaminophen Level   Result Value Ref Range    ACETAMINOPHEN LEVEL 42 (H) 10 - 30 ug/mL   Salicylate  Acid Level   Result Value Ref Range    SALICYLATE LEVEL <4 (L) 4 - 30 mg/dL   CBC WITH DIFF   Result Value Ref Range    WBC 8.5 4.0 - 11.0 x10^3/uL    RBC 4.13 (L) 4.40 - 5.80 x10^6/uL    HGB 12.0 (L) 13.5 - 18.0 g/dL    HCT 37.1 (L) 40.0 - 50.0 %    MCV 90.0 82.0 - 97.0 fL    MCH 29.2 27.5 - 33.2 pg    MCHC 32.4 32.0 - 36.0 g/dL    RDW 16.0 11.0 - 16.0 %    PLATELETS 121 (L) 150 - 450 x10^3/uL    MPV 11.0 (H) 7.4 - 10.5 fL    NEUTROPHIL % 70 43 - 76 %    LYMPHOCYTE % 17 15 - 43 %    MONOCYTE % 11 5 - 12 %    EOSINOPHIL % 2 0 - 5 %    BASOPHIL % 1 0 - 3 %    NEUTROPHIL # 5.90 1.50 - 6.50 x10^3/uL    LYMPHOCYTE # 1.40 1.00 - 4.80 x10^3/uL    MONOCYTE # 0.90 0.20 - 0.90 x10^3/uL    EOSINOPHIL # 0.10 0.00 - 0.50 x10^3/uL    BASOPHIL # 0.10 0.00 - 0.10 x10^3/uL   URINALYSIS, MACROSCOPIC   Result Value Ref Range    COLOR Yellow Yellow    APPEARANCE Clear Clear    PH 6.0 5.0 - 7.5    LEUKOCYTES Negative Negative WBCs/uL    NITRITE Negative Negative    PROTEIN 30  (A) Negative mg/dL    GLUCOSE 50  (A) Normal mg/dL    KETONES Negative Negative mg/dL    UROBILINOGEN < 1 <=2.0 mg/dL    BILIRUBIN Negative Negative mg/dL    BLOOD 3+ (A) Negative mg/dL    SPECIFIC GRAVITY 1.010 1.005 - 1.020   URINALYSIS, MICROSCOPIC   Result Value Ref Range    RBCS 3-5 (A) 0-2, None /hpf    WBCS 0-2 0-2, 3-5, None /hpf    BACTERIA None None /hpf    SQUAMOUS EPITHELIAL 0-2 0-2, None /hpf    MUCOUS Present (A)  None /hpf    HYALINE CASTS 0-2 None, 0-2 /lpf   Acetaminophen Level   Result Value Ref Range    ACETAMINOPHEN LEVEL 19 10 - 30 ug/mL   OSMOLALITY, RANDOM URINE   Result Value Ref Range    OSMOLALITY URINE 283 50-1,200 mOsm/kg   SODIUM, RANDOM URINE   Result Value Ref Range    SODIUM RANDOM URINE 19 No Reference Range Established mmol/L   THYROID STIMULATING HORMONE WITH FREE T4 REFLEX   Result Value Ref Range    TSH 1.674 0.340 - 5.330 uIU/mL   COMPREHENSIVE METABOLIC PROFILE - [facility] ONLY   Result Value Ref Range    SODIUM 135 (L) 136 - 145 mmol/L    POTASSIUM 4.2 3.4 - 5.1 mmol/L    CHLORIDE 105 101 - 111 mmol/L    CO2 TOTAL 21 (L) 22 - 32 mmol/L    ANION GAP 9 3 - 11 mmol/L    BUN 18 6 - 20 mg/dL    CREATININE 1.03 0.61 - 1.24 mg/dL    BUN/CREA RATIO 17 6 - 22    ESTIMATED GFR >60 >60 mL/min/1.67m^2    ALBUMIN 2.8 (L) 3.5 - 5.0 g/dL    CALCIUM 8.1 (L) 8.8 - 10.2 mg/dL    GLUCOSE 171 (H) 70 - 110  mg/dL    ALKALINE PHOSPHATASE 173 (H) 38 - 126 U/L    ALT (SGPT) 39 17 - 63 U/L    AST (SGOT) 56 (H) 15 - 41 U/L    BILIRUBIN TOTAL 1.2 0.3 - 1.2 mg/dL    PROTEIN TOTAL 6.6 6.4 - 8.3 g/dL    ALBUMIN/GLOBULIN RATIO 0.7 (L) 0.8 - 2.0   PT/INR daily x 7   Result Value Ref Range    PROTHROMBIN TIME 20.9 (H) 9.2 - 12.3 seconds    INR 1.82    ACETAMINOPHEN LEVEL   Result Value Ref Range    ACETAMINOPHEN LEVEL <10 (L) 10 - 30 ug/mL   ECG 12-LEAD   Result Value Ref Range    Ventricular rate 48 BPM    Atrial Rate 48 BPM    PR Interval 184 ms    QRS Duration 72 ms    QT Interval 480 ms    QTC Calculation 428 ms    Calculated P Axis   degrees    Calculated R Axis -33 degrees    Calculated T Axis -22 degrees   POC FINGERSTICK GLUCOSE - [facility] (RESULTS)   Result Value Ref Range    GLUCOSE, POC 162 (H) 60 - 100 mg/dl       Radiology:  XR CHEST AP PORTABLE  XR KNEE RIGHT AP, LAT & 1 OBLIQUE  XR KNEE RIGHT AP, LAT & 1 OBLIQUE   Final Result      1. Chondrocalcinosis.   2. Atherosclerosis.   3. Stable mild osteoarthritic degenerative  changes.             Radiologist location ID: [id number]         XR CHEST AP PORTABLE   Final Result   No evidence of acute cardiopulmonary disease.            Radiologist location ID: [id number]             ECG:  NONE      Procedure     Procedures    Course/Disposition/Plan     Course:  Chart review was completed.    Initial Evaluation:  09:29: Initial evaluation is complete at this time. I will reevaluate to check the patient's progress after treatment. Patient is agreeable with the treatment plan at this time.    Patient admits taking approximately 50 Zanaflex tablets 1 hr prior to arrival.  Patient is sleepy initially hypotensive and bradycardic on arrival.  Patient's blood pressure has improved with IV fluids and supportive measures.  Patient also admits taking an escalating dose of his Zanaflex over the past several days.  The patient may have taken some over-the-counter medications as well however cannot pinpoint a time.  Where he took excessive amounts of Tylenol or aspirin.  ER evaluation demonstrates a a slightly elevated Tylenol level.  Due to the uncertainty of the patient's time of ingestion the poison Center recommends initiating a seated O2 for the possibility of Tylenol cold ingestion and toxicity.  The patient will be admitted to the hospital for supportive therapy for this and flex overdose as well.  Disposition:    Admitted    Condition at Disposition:    Serious    Follow up:   No follow-up provider specified.    Clinical Impression:     Encounter Diagnoses   Name Primary?   . Drug overdose Yes   . Aortic Mechanical heart valve present    . Other generalized epilepsy, not intractable, without  status epilepticus (CMS [place])    . Anticoagulation goal of INR 2 to 3    . Osteoarthritis        Future Appointments Scheduled in Epic:  Future Appointments   Date Time Provider [place]   09/02/2017  1:20 PM Liliana Cline, [place] [facility]   09/11/2017  1:00 PM Sprouse, Dara Lords, MD J[facility] Bastos  [facility]   12/25/2017 12:45 PM Docia Furl, MD [facility]     SCRIBE ATTESTATION   This note is prepared by Claudius Sis, acting as Scribe for Dr. Rigoberto Noel.    The scribe's documentation has been prepared under my direction and personally reviewed by me in its entirety.  I confirm that the note above accurately reflects all work, treatment, procedures, and medical decision making performed by me,  Dr. Rigoberto Noel.

## 2017-08-11 NOTE — ED Nurses Note (Signed)
Patient becoming agitated. Refusing any further vitals. Aware we are waiting for bed assignment at this time and new IV to be placed. Patient "huffing". Frequent repositioning in stretcher. Patient is aware when brought to the floor, bed will be more comfortable. Pt to restroom via wheelchair.

## 2017-08-11 NOTE — Nurses Notes (Signed)
Dr Izetta Dakin at nurses station. Notified him of Patient's continued anxious feelings. Nicoderm patch applied to patient. No new orders.

## 2017-08-11 NOTE — ED Nurses Note (Signed)
[facility] residents to bedside for pt evaluation.

## 2017-08-11 NOTE — Nurses Notes (Signed)
Patient standing in room with gown off. Stated "I can't breath, I'm feeling really anxious". Patient with increased agitation. Oxygen saturation on RA 97%. Lungs Clear and diminished posterior. Oxygen 2L NC applied to patient, instructed to take slow, deep breaths. Patient stated "this isn't working!" Phone call to Dr Izetta Dakin, Dr to come to bedside.

## 2017-08-11 NOTE — ED Triage Notes (Addendum)
Pateint arrives EMS, [facility] [place], from home for overdose of Zanaflex. Patietn states he has not slept in 48 hours because of arthritis pain. Patient had prescription filled on 08/05/2017, #90. Bottle was empty per EMS. Patietn is awake with slurred speech at time of triage.     Patient denies any SI or HI thoughts.

## 2017-08-11 NOTE — Nurses Notes (Signed)
Patient stated it was "OK" to give daughter, Caryl Pina,  update over the phone. Privacy code given to daughter. Update given.

## 2017-08-11 NOTE — ED Nurses Note (Addendum)
Patient sitting up on side of bed eating meal tray. Patient admits to this nurse that "that medication is kind of making me hallucinate. I'm seeing things "behind my eyes" on the wall". Provider aware. Pt removed from b/p and spo2 monitor so he can eat meal tray.

## 2017-08-11 NOTE — ED Nurses Note (Signed)
Radiology tech notified this RN that patient needed to use the restroom. Patient given urinal. Primary RN aware.

## 2017-08-11 NOTE — H&P (Signed)
FAMILY MEDICINE  History & Physical       Name: [name], [name], 67 y.o. male Date of Service:  08/11/2017   Date of Birth:  January 26, 1951  MRN: [id number] Date of Admission:  08/11/2017   PCP: [name], MD LOS: 0    Room: [facility]   Attending: Roxanna Mew, MD   Information Obtained from: Patient  Code Status: Full Code Chief Complaint:  Dizziness  Admitted for:  Overdose on muscle relaxant     HISTORY of PRESENTING ILLNESS:   [name] is a 67 y.o. 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 Patient reports to taking 40-50 pills of Zanaflex this morning following breakfast.  Patient was prescribed Zanaflex 4 mg 3 times a day as needed for bilateral knee pain 1 week ago.  Patient states that he was taking increasing dosage of the medication in the last 3-4 days.  This morning, he felt that he needed more medications and took about 40-50 pills as the prescribed regimen was not improving his pain and he could not sleep for the last 48 hr.  Patient also reported taking additional pills of a different medication but not sure if it was the naproxen or extra-strength Tylenol.  After taking the medication, patient felt dizzy but denied any loss of consciousness, headache, chest pain, shortness of breath, palpitation or numbness or tingling to any extremities.  Patient also denied any nausea, vomiting or any pain.  Patient does report to "seeing things"when he closes his eyes.  Patient denies any instability while ambulating and denies any lightheadedness when going from a sitting position to standing.  Patient denies any similar episodes in the past.  Patient denies any suicidal ideation.    ED Course: BP (!) 79/41   Pulse 46   Temp 37 C (98.6 F)   Resp 20   Wt 126.6 kg (279 lb 3.2 oz)   SpO2 98%   BMI 37.87 kg/m   Per ED  staff, patient has wake but had slurred speech on presentation.  Patient's lab indicated elevated acetaminophen level (42) along with low sodium levels.  Poison Control was contacted regarding patient's overdose of Zanaflex and elevated serum acetaminophen level.  Per poison Control, overdose of Zanaflex results and side effects such as hypotension, bradycardia, slurred speech and hallucination.  Per poison Control, patient would benefit from initiation of Mucomyst to stabilize the acetaminophen level.  Her repeat acetaminophen level was obtained and patient was started on Mucomyst. [facility] was consulted at this point for admission and management of patient's overdose.    MEDICAL HISTORY:     PAST MEDICAL & SURGICAL HISTORIES:   Past Medical History:   Diagnosis Date   . Abnormal EKG    . Anxiety    . Aortic valve replaced    . Bladder stone    . Depression    . Diverticulitis    . Kidney stones    . Rectal bleeding    . Seizures    . Viral hepatitis C    . Wears glasses     Past Surgical History:   Procedure Laterality Date   . HX AORTIC VALVE REPLACEMENT     . HX CHOLECYSTECTOMY     . HX COLECTOMY  2010    at [facility] for diverticulitis.   Marland Kitchen HX COLONOSCOPY     .  HX KNEE REPLACMENT Left         HOME MEDICATIONS:  Outpatient Medications Marked as Taking for the 08/11/17 encounter [facility] Encounter)   Medication Sig   . aspirin (ASPIR-81 ORAL) Take by mouth   . atorvastatin (LIPITOR) 10 mg Oral Tablet Take 1 Tab (10 mg total) by mouth Once a day   . carvedilol (COREG) 3.125 mg Oral Tablet Take 1 Tab (3.125 mg total) by mouth Twice daily with food   . escitalopram oxalate (LEXAPRO) 20 mg Oral Tablet Take 1 Tab (20 mg total) by mouth Once a day   . levETIRAcetam (KEPPRA) 1,000 mg Oral Tablet Take 1 Tab (1,000 mg total) by mouth Twice daily   . ramipril (ALTACE) 5 mg Oral Capsule Take 1 Cap (5 mg total) by mouth Once a day   . raNITIdine (ZANTAC) 150 mg Oral Tablet Take 1 Tab (150 mg total) by mouth Twice daily   .  tamsulosin (FLOMAX) 0.4 mg Oral Capsule Take 1 Cap (0.4 mg total) by mouth Every evening after dinner   . tiZANidine (ZANAFLEX) 4 mg Oral Tablet Take 1 Tab (4 mg total) by mouth Three times a day as needed   . warfarin (COUMADIN) 5 mg Oral Tablet Take 1 Tab (5 mg total) by mouth Every Monday, Wednesday and Friday   . warfarin (COUMADIN) 7.5 mg Oral Tablet Take 1 Tab (7.5 mg total) by mouth Every Sunday, Tuesday, Thursday and Saturday        ALLERGIES:  He is allergic to morphine.     FAMILY HISTORY:  His family history includes Atrial fibrillation in his brother; COPD in his mother; Heart Disease in his father; Heart Surgery in his father; No Known Problems in his daughter, maternal grandfather, maternal grandmother, maternal uncle, paternal aunt, paternal grandfather, paternal grandmother, paternal uncle, sister, and son; Prostate Cancer (age of onset: 24) in his father.    SOCIAL HISTORY:  He  reports that he does not drink alcohol. He  reports that he has been smoking.  He has a 40.00 pack-year smoking history. He has never used smokeless tobacco. He  reports that he does not use drugs.  Social History     Social History Narrative   . Not on file       REVIEW OF SYSTEMS:   Review of Systems   Constitutional: Negative for activity change, appetite change, chills, diaphoresis and fatigue.   HENT: Negative for congestion, rhinorrhea, sinus pain, sneezing and sore throat.    Eyes: Negative for discharge and redness.   Respiratory: Negative for cough, chest tightness, shortness of breath and wheezing.    Cardiovascular: Negative for chest pain, palpitations and leg swelling.   Gastrointestinal: Negative for abdominal distention, abdominal pain, blood in stool, constipation, diarrhea, nausea and vomiting.   Genitourinary: Negative for dysuria, flank pain, frequency, hematuria and urgency.   Musculoskeletal: Positive for arthralgias. Negative for back pain, neck pain and neck stiffness.   Skin: Negative for color  change.   Neurological: Positive for dizziness. Negative for syncope, light-headedness, numbness and headaches.   Psychiatric/Behavioral: Positive for hallucinations and sleep disturbance. Negative for confusion, self-injury and suicidal ideas.       EXAMINATION:  Temperature: 36.5 C (97.7 F) Heart Rate: 66 BP (Non-Invasive): (!) 148/84   Respiratory Rate: 20 SpO2: 97 % Pain Score (Numeric, Faces): 0("I'm ok right now")   Physical Exam   Constitutional: He is oriented to person, place, and time. He appears well-developed and well-nourished. No distress.  HENT:   Head: Normocephalic and atraumatic.   Poor dentition;   Eyes: Pupils are equal, round, and reactive to light. Conjunctivae are normal. Right eye exhibits no discharge. Left eye exhibits no discharge.   Neck: Normal range of motion. No tracheal deviation present. No thyromegaly present.   Cardiovascular: Normal rate, regular rhythm, normal heart sounds and intact distal pulses.   Pulmonary/Chest: Effort normal and breath sounds normal. No respiratory distress. He has no wheezes. He exhibits no tenderness.   Abdominal: Soft. Bowel sounds are normal. He exhibits no distension. There is no tenderness.   Musculoskeletal: Normal range of motion. He exhibits no deformity.   Neurological: He is alert and oriented to person, place, and time. No cranial nerve deficit.   Mild gait abnormality due to pain in bilateral knees   Skin: Skin is warm. Capillary refill takes less than 2 seconds. No rash noted. He is not diaphoretic. No erythema.   Psychiatric: He has a normal mood and affect. His behavior is normal. Thought content normal.       LABS:    Results for orders placed or performed during the hospital encounter of 08/11/17 (from the past 24 hour(s))   CBC/DIFF    Narrative    The following orders were created for panel order CBC/DIFF.  Procedure                               Abnormality         Status                     ---------                                -----------         ------                     CBC WITH ZOXW[[id number]]                Abnormal            Final result                 Please view results for these tests on the individual orders.   COMPREHENSIVE METABOLIC PROFILE - [facility] ONLY   Result Value Ref Range    SODIUM 127 (L) 136 - 145 mmol/L    POTASSIUM 4.8 3.4 - 5.1 mmol/L    CHLORIDE 97 (L) 101 - 111 mmol/L    CO2 TOTAL 23 22 - 32 mmol/L    ANION GAP 7 3 - 11 mmol/L    BUN 26 (H) 6 - 20 mg/dL    CREATININE 1.27 (H) 0.61 - 1.24 mg/dL    BUN/CREA RATIO 20 6 - 22    ESTIMATED GFR 57 (L) >60 mL/min/1.40m^2    ALBUMIN 2.7 (L) 3.5 - 5.0 g/dL    CALCIUM 8.2 (L) 8.8 - 10.2 mg/dL    GLUCOSE 169 (H) 70 - 110 mg/dL    ALKALINE PHOSPHATASE 173 (H) 38 - 126 U/L    ALT (SGPT) 38 17 - 63 U/L    AST (SGOT) 58 (H) 15 - 41 U/L    BILIRUBIN TOTAL 0.6 0.3 - 1.2 mg/dL    PROTEIN TOTAL 6.2 (L) 6.4 - 8.3 g/dL    ALBUMIN/GLOBULIN RATIO 0.8 0.8 - 2.0  ETHANOL, SERUM   Result Value Ref Range    ETHANOL Not Detected     Narrative    This test result is for medical purposes only, not legal purposes   PT/INR   Result Value Ref Range    PROTHROMBIN TIME 14.5 (H) 9.2 - 12.3 seconds    INR 1.28     Narrative    Coumadin therapy INR range for Conventional Anticoagulation is 2.0 to 3.0 and for Intensive Anticoagulation 2.5 to 3.5.   TROPONIN-I   Result Value Ref Range    TROPONIN I <0.02 0.00 - 0.06 ng/mL   DRUG SCREEN,URINE,RAPID - [facility] ONLY   Result Value Ref Range    AMPHETAMINES URINE Negative Negative    BARBITURATES URINE Negative Negative    BENZODIAZEPINES URINE Negative Negative    PCP URINE Negative Negative    METHADONE URINE Negative Negative    OPIATES URINE Negative Negative    CANNABINOIDS URINE Negative Negative    COCAINE METABOLITES URINE Negative Negative    OXYCODONE URINE Negative Negative    TRICYCLIC ANTIDEPRESSANTS URINE Negative Negative    Narrative    This urine drug screen provides preliminary qualitative results only. Unconfirmed screening results  must not be used for non-medical purposes. If drug confirmation is needed, additional testing must be ordered by the physician.   URINALYSIS W/REFLEX TO CULTURE - [facility] ONLY    Narrative    The following orders were created for panel order URINALYSIS W/REFLEX TO CULTURE - [facility] ONLY.  Procedure                               Abnormality         Status                     ---------                               -----------         ------                     URINALYSIS, MACROSCOPIC[[id number]]      Abnormal            Final result               URINALYSIS, MICROSCOPIC[[id number]]      Abnormal            Final result                 Please view results for these tests on the individual orders.   Acetaminophen Level   Result Value Ref Range    ACETAMINOPHEN LEVEL 42 (H) 10 - 30 ug/mL   Salicylate Acid Level   Result Value Ref Range    SALICYLATE LEVEL <4 (L) 4 - 30 mg/dL   CBC WITH DIFF   Result Value Ref Range    WBC 8.5 4.0 - 11.0 x10^3/uL    RBC 4.13 (L) 4.40 - 5.80 x10^6/uL    HGB 12.0 (L) 13.5 - 18.0 g/dL    HCT 37.1 (L) 40.0 - 50.0 %    MCV 90.0 82.0 - 97.0 fL    MCH 29.2 27.5 - 33.2 pg    MCHC 32.4 32.0 - 36.0 g/dL    RDW 16.0 11.0 - 16.0 %    PLATELETS 121 (L) 150 -  450 x10^3/uL    MPV 11.0 (H) 7.4 - 10.5 fL    NEUTROPHIL % 70 43 - 76 %    LYMPHOCYTE % 17 15 - 43 %    MONOCYTE % 11 5 - 12 %    EOSINOPHIL % 2 0 - 5 %    BASOPHIL % 1 0 - 3 %    NEUTROPHIL # 5.90 1.50 - 6.50 x10^3/uL    LYMPHOCYTE # 1.40 1.00 - 4.80 x10^3/uL    MONOCYTE # 0.90 0.20 - 0.90 x10^3/uL    EOSINOPHIL # 0.10 0.00 - 0.50 x10^3/uL    BASOPHIL # 0.10 0.00 - 0.10 x10^3/uL   URINALYSIS, MACROSCOPIC   Result Value Ref Range    COLOR Yellow Yellow    APPEARANCE Clear Clear    PH 6.0 5.0 - 7.5    LEUKOCYTES Negative Negative WBCs/uL    NITRITE Negative Negative    PROTEIN 30  (A) Negative mg/dL    GLUCOSE 50  (A) Normal mg/dL    KETONES Negative Negative mg/dL    UROBILINOGEN < 1 <=2.0 mg/dL    BILIRUBIN Negative Negative mg/dL    BLOOD 3+ (A)  Negative mg/dL    SPECIFIC GRAVITY 1.010 1.005 - 1.020   URINALYSIS, MICROSCOPIC   Result Value Ref Range    RBCS 3-5 (A) 0-2, None /hpf    WBCS 0-2 0-2, 3-5, None /hpf    BACTERIA None None /hpf    SQUAMOUS EPITHELIAL 0-2 0-2, None /hpf    MUCOUS Present (A) None /hpf    HYALINE CASTS 0-2 None, 0-2 /lpf   Acetaminophen Level   Result Value Ref Range    ACETAMINOPHEN LEVEL 19 10 - 30 ug/mL   POC FINGERSTICK GLUCOSE - [facility] (RESULTS)   Result Value Ref Range    GLUCOSE, POC 162 (H) 60 - 100 mg/dl     IMAGING STUDIES:    Results for orders placed or performed during the hospital encounter of 08/11/17 (from the past 24 hour(s))   XR CHEST AP PORTABLE     Status: None    Narrative    Felizardo Hoffmann Bosak  Male, 67 years old.    XR CHEST AP PORTABLE performed on 08/11/2017 11:52 AM.    REASON FOR EXAM:  overdose    COMPARISON: January 23, 2015    TECHNIQUE: Single view of chest.    FINDINGS:   The patient is status post median sternotomy. The lungs are adequately  inflated. There is no evidence of pneumothorax, pleural effusion or  concerning focal airspace opacity. The cardiac silhouette is stable in size  given differences in technique and patient position. The pulmonary  vasculature is normal in course and caliber. There is normal distal left  clavicle fracture.      Impression    No evidence of acute cardiopulmonary disease.        Radiologist location ID: [id number]     XR KNEE RIGHT AP, LAT & 1 OBLIQUE     Status: None    Narrative    MUSTAPHA COLSON Umholtz  Male, 67 years old.    XR KNEE RIGHT AP, LAT & 1 OBLIQUE performed on 08/11/2017 11:52 AM.    REASON FOR EXAM:  right knee pain    COMPARISON: None available    TECHNIQUE: AP, lateral and oblique views of the right kidney    FINDINGS:  There is normal bony mineralization. There is normal bony alignment. Thin  calcific densities are redemonstrated within the  medial and lateral  compartments. Small marginal osteophytes are redemonstrated at the medial  and lateral tibial plateaus  as well as around the patella. No acute  fracture lines identified. Mural vascular calcifications are seen within  the posterior soft tissues.      Impression    1. Chondrocalcinosis.  2. Atherosclerosis.  3. Stable mild osteoarthritic degenerative changes.         Radiologist location ID: [id number]         EKG:    Most Recent EKG This Encounter   ECG 12-LEAD    Collection Time: 08/11/17  9:09 AM   Result Value    Ventricular rate 48    Atrial Rate 48    PR Interval 184    QRS Duration 72    QT Interval 480    QTC Calculation 428    Calculated P Axis      Calculated R Axis -33    Calculated T Axis -22    Narrative    Marked sinus bradycardia  Left axis deviation  T wave abnormality, consider lateral ischemia  Abnormal ECG  When compared with ECG of 02-Dec-2012 10:36,  Criteria for Septal infarct are no longer Present  T wave inversion now evident in Inferior leads  T wave inversion now evident in Anterolateral leads     ASSESSMENT:  GUENTHER DUNSHEE is a 67 y.o. 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  Patient is being admitted for overdose of muscle relaxant as well as acetaminophen.  Active Hospital Problems    Diagnosis   . Primary Problem: Overdose of muscle relaxant   . Tobacco abuse   . Osteoarthritis   . Joint pain   . Insomnia   . Epilepsy ([place])   . Aortic Mechanical heart valve present       PLAN:  Admit to observation under telemetry  # Overdose of muscle relaxant  -Poison Control   monitor for symptoms: CNS depression ranging from mild drowsiness to lethargy; agitation and confusion; bradycardia and hypotension  -Zanaflex peak effect:  1-2 hours  -neurochecks q.4 hours    #Acetaminophen overdose  -Poison Control    monitor for symptoms:  Nausea, vomiting, anorexia, diaphoresis, pallor, right upper quadrant pain, jaundice   monitor for liver  enzymes: peak approximately 3-5 days post ingestion  -received loading dose of acetylcysteine in the ED  -will receive maintenance dose x2  -will follow up with CMP, PT INR and acetaminophen level @0700 .     # hyponatremia  -sodium correction for hyperglycemia:  128  -will follow-up with urine sodium, urine osmolality  -Will restrict fluids for now    # tobacco abuse  -will start nicotine patch 21 mg daily    # aortic mechanical heart valve with subtherapeutic INR  -target INR 2-3  -INR on admission 1.28  -Will give Warfarin 7.5mg  tonight   -Will follow up with INR in the AM    # insomnia/anxiety  -will start melatonin p.r.n. nightly    #Epilepsy   -Continue Keppra     Consults - none  Hardware (Lines, Drains, Foley, Tubes) - PIV  Diet - Regular  Activity - Up w/ assistance and Up in chair TID w/ all meals  Therapy - None    DVT Risk Factors:  Age greater than 40 and Obesity  DVT/PE Prophylaxis: Enoxaparin  DNR Status:  Full Code    Advanced Care Planning - None    Disposition Planning - TBD; Likely Home discharge     Patient seen and discussed with attending physician Dr. James Ivanoff, MD - 08/11/2017   PGY-1  [place]     ____________________________________________________________________  Late entry note for face-to-face visit on 08/11/2017.     I saw and examined the patient and discussed management with the resident. I reviewed the resident's note and agree with the documented findings and plan of care except as noted below:    Roxanna Mew, MD 08/12/2017 06:22

## 2017-08-12 ENCOUNTER — Encounter: Payer: Self-pay | Admitting: Family Medicine

## 2017-08-12 DIAGNOSIS — R262 Difficulty in walking, not elsewhere classified: Secondary | ICD-10-CM | POA: Diagnosis not present

## 2017-08-12 DIAGNOSIS — G40909 Epilepsy, unspecified, not intractable, without status epilepticus: Secondary | ICD-10-CM | POA: Diagnosis not present

## 2017-08-12 DIAGNOSIS — E669 Obesity, unspecified: Secondary | ICD-10-CM | POA: Diagnosis not present

## 2017-08-12 DIAGNOSIS — R791 Abnormal coagulation profile: Secondary | ICD-10-CM | POA: Diagnosis not present

## 2017-08-12 DIAGNOSIS — M1990 Unspecified osteoarthritis, unspecified site: Secondary | ICD-10-CM | POA: Diagnosis not present

## 2017-08-12 DIAGNOSIS — F419 Anxiety disorder, unspecified: Secondary | ICD-10-CM | POA: Diagnosis not present

## 2017-08-12 DIAGNOSIS — T391X2A Poisoning by 4-Aminophenol derivatives, intentional self-harm, initial encounter: Secondary | ICD-10-CM | POA: Diagnosis not present

## 2017-08-12 DIAGNOSIS — Z6838 Body mass index (BMI) 38.0-38.9, adult: Secondary | ICD-10-CM | POA: Diagnosis not present

## 2017-08-12 DIAGNOSIS — T428X2A Poisoning by antiparkinsonism drugs and other central muscle-tone depressants, intentional self-harm, initial encounter: Secondary | ICD-10-CM | POA: Diagnosis not present

## 2017-08-12 DIAGNOSIS — G4700 Insomnia, unspecified: Secondary | ICD-10-CM | POA: Diagnosis not present

## 2017-08-12 DIAGNOSIS — F17210 Nicotine dependence, cigarettes, uncomplicated: Secondary | ICD-10-CM | POA: Diagnosis not present

## 2017-08-12 DIAGNOSIS — E871 Hypo-osmolality and hyponatremia: Secondary | ICD-10-CM | POA: Diagnosis not present

## 2017-08-12 LAB — COMPREHENSIVE METABOLIC PROFILE - BMC/JMC ONLY
ALBUMIN/GLOBULIN RATIO: 0.7 — ABNORMAL LOW (ref 0.8–2.0)
ALBUMIN: 2.8 g/dL — ABNORMAL LOW (ref 3.5–5.0)
ALKALINE PHOSPHATASE: 173 U/L — ABNORMAL HIGH (ref 38–126)
ALKALINE PHOSPHATASE: 173 U/L — ABNORMAL HIGH (ref 38–126)
ALT (SGPT): 39 U/L (ref 17–63)
ANION GAP: 9 mmol/L (ref 3–11)
ANION GAP: 9 mmol/L (ref 3–11)
AST (SGOT): 56 U/L — ABNORMAL HIGH (ref 15–41)
BILIRUBIN TOTAL: 1.2 mg/dL (ref 0.3–1.2)
BUN/CREA RATIO: 17 (ref 6–22)
BUN: 18 mg/dL (ref 6–20)
CALCIUM: 8.1 mg/dL — ABNORMAL LOW (ref 8.8–10.2)
CHLORIDE: 105 mmol/L (ref 101–111)
CO2 TOTAL: 21 mmol/L — ABNORMAL LOW (ref 22–32)
CREATININE: 1.03 mg/dL (ref 0.61–1.24)
ESTIMATED GFR: >60 mL/min/{1.73_m2} (ref >60)
GLUCOSE: 171 mg/dL — ABNORMAL HIGH (ref 70–110)
POTASSIUM: 4.2 mmol/L (ref 3.4–5.1)
PROTEIN TOTAL: 6.6 g/dL (ref 6.4–8.3)
SODIUM: 135 mmol/L — ABNORMAL LOW (ref 136–145)
SODIUM: 135 mmol/L — ABNORMAL LOW (ref 136–145)

## 2017-08-12 LAB — PT/INR
PROTHROMBIN TIME: 20.9 seconds — ABNORMAL HIGH (ref 9.2–12.3)
PROTHROMBIN TIME: 20.9 seconds — ABNORMAL HIGH (ref 9.2–12.3)

## 2017-08-12 LAB — THYROID STIMULATING HORMONE WITH FREE T4 REFLEX: TSH: 1.674 u[IU]/mL (ref 0.340–5.330)

## 2017-08-12 MED ORDER — CALCIUM CARBONATE 200 MG CALCIUM (500 MG) CHEWABLE TABLET
500.00 mg | CHEWABLE_TABLET | Freq: Every day | ORAL | Status: DC | PRN
Start: 2017-08-12 — End: 2017-08-12

## 2017-08-12 MED ORDER — ONDANSETRON HCL (PF) 4 MG/2 ML INJECTION SOLUTION
4.00 mg | INTRAMUSCULAR | Status: AC
Start: 2017-08-12 — End: 2017-08-12
  Administered 2017-08-12: 4 mg via INTRAVENOUS
  Filled 2017-08-12 (×2): qty 2

## 2017-08-12 MED ORDER — LORAZEPAM 0.5 MG TABLET
0.50 mg | ORAL_TABLET | ORAL | Status: DC
Start: 2017-08-12 — End: 2017-08-12

## 2017-08-12 MED ORDER — ONDANSETRON HCL (PF) 4 MG/2 ML INJECTION SOLUTION
4.00 mg | Freq: Three times a day (TID) | INTRAMUSCULAR | Status: DC | PRN
Start: 2017-08-12 — End: 2017-08-12
  Administered 2017-08-12 (×2): 4 mg via INTRAVENOUS

## 2017-08-12 MED ORDER — CARVEDILOL 6.25 MG TABLET
3.1250 mg | ORAL_TABLET | Freq: Two times a day (BID) | ORAL | Status: DC
Start: 2017-08-12 — End: 2017-08-12
  Administered 2017-08-12 (×2): 3.125 mg via ORAL
  Filled 2017-08-12: qty 1

## 2017-08-12 MED ORDER — LISINOPRIL 20 MG TABLET
20.0000 mg | ORAL_TABLET | Freq: Every day | ORAL | Status: DC
Start: 2017-08-12 — End: 2017-08-12
  Administered 2017-08-12: 20 mg via ORAL
  Filled 2017-08-12: qty 1

## 2017-08-12 MED ORDER — LORAZEPAM 2 MG/ML INJECTION SOLUTION
0.50 mg | INTRAMUSCULAR | Status: DC | PRN
Start: 2017-08-12 — End: 2017-08-12
  Administered 2017-08-12: 0.5 mg via INTRAVENOUS
  Filled 2017-08-12: qty 1

## 2017-08-12 MED ORDER — METOPROLOL TARTRATE 5 MG/5 ML INTRAVENOUS SOLUTION
5.00 mg | INTRAVENOUS | Status: AC
Start: 2017-08-12 — End: 2017-08-12
  Administered 2017-08-12 (×2): 5 mg via INTRAVENOUS
  Filled 2017-08-12: qty 5

## 2017-08-12 MED ORDER — DEXTROSE 5 % IN WATER (D5W) INTRAVENOUS SOLUTION
10000.0000 mg | Freq: Once | INTRAVENOUS | Status: DC
Start: 2017-08-12 — End: 2017-08-12

## 2017-08-12 MED ORDER — ZOLPIDEM 5 MG TABLET
5.00 mg | ORAL_TABLET | ORAL | Status: AC
Start: 2017-08-12 — End: 2017-08-12
  Administered 2017-08-12: 5 mg via ORAL
  Filled 2017-08-12: qty 1

## 2017-08-12 MED ORDER — DEXTROSE 5 % IN WATER (D5W) INTRAVENOUS SOLUTION
10000.0000 mg | Freq: Once | INTRAVENOUS | Status: DC
Start: 2017-08-12 — End: 2017-08-12
  Filled 2017-08-12: qty 50

## 2017-08-12 MED ORDER — DICLOFENAC 1 % TOPICAL GEL
Freq: Three times a day (TID) | CUTANEOUS | 0 refills | Status: AC
Start: 2017-08-12 — End: 2017-09-11

## 2017-08-12 MED ADMIN — heparin (porcine) 25,000 unit/500 mL in 0.45 % sodium chloride IV soln: ORAL | @ 08:00:00

## 2017-08-12 NOTE — OT Treatment (Signed)
[facility]  [address], [place], [place] [address]    Occupational Therapy Progress Note    Patient Name: Joshua, Soulier.  Date of Birth: 1951-01-17  Room: SCU2     Subjective/Comment on Pain:  OT consult received this date.  Pt is currently up independently within his room with use of RW.  Pt reports no concerns/issues with ability to perform ADL related tasks at home following D/C.  Pt has no skilled OT needs at this time.  OT consult will be D/C.  Clyda Hurdle, OT  08/12/17 14:11

## 2017-08-12 NOTE — Progress Notes (Signed)
[facility]  BRIEF OVERNIGHT INTERVENTION NOTE    Patient name: [name]  Patient age: 67 y.o.  Patient sex: male  Date of birth: 1950-10-09  Admission Date and Time: 08/11/2017  9:04 AM  Date of service: 08/12/2017      S:  Throughout the entire evening the patient has been awake and mildly agitated. He frequently walks around the room and tries to take off his telemetry monitoring equipment.  He has remained alert with mild tachycardia and blood pressures high normal.  Initially he complained of right knee pain and was given lidocaine patch at 9:41 p.m. without benefit.  For insomnia he was given melatonin 3 mg at 8:01 p.m. and again at 11:45 p.m without effect.  He remained  Agitated,  complaining of being unable to sleep so he was given Ambien 5 mg at 1:27 a.m. Around 3:30 a.m. He was out of bed and vomited mostly liquid and old food contents.  He was given IV Zofran at 3:34 a.m. and IV Ativan 0.5 mg at 3:33 a.m. due to continued agitation.  Around 4am he got up to go to the bathroom and fell on his right side against the floor without any reports of injury.  He remained oriented to person and time but not to place.  Due to HR up to 150s, he was given metoprolol IV 5 mg at 4:16 a.m.    O:  BP (!) 161/99   Pulse (!) 154   Temp 36.4 C (97.6 F)   Resp 18   Ht 1.803 m (5\' 11" )   Wt 128.5 kg (283 lb 6.4 oz)   SpO2 97%   BMI 39.53 kg/m     Physical Exam  General:  Caucasian male, appears stated age, obese, without acute distress  Eyes: Pupils equal and round. Sclera white, conjunctiva clear.   CV:  Mild tachycardia.  Lungs:  No respiratory distress.  No acute tachypnea.  Normal work of breathing.  Abdomen:  Distended from obesity.  Neuro:  Alert.  Responds appropriately to questions.  Oriented to person and time but not place. Grossly normal gait.  No tremors.   Psych:  Mildly agitated and restless.Easily distracted.  Speech and affect are appropriate.     A: 68 yo recovered from sedation due  to overdose of tizanidine, now with agitation possibly from delirium. He also has sinus tachycardia and elevated blood pressure that may be a result of agitation     P:  - changed patient to 1 on 1 nursing supervision  - will not give further medications that could worsen delirium or increased risk of falls  - Added back home carvedilol and lisinopril (for ramipril) due to hypertension    Carmelia Bake, MD, [place] Resident, 08/12/2017 05:03    ____________________________________________________________________    I discussed management with the resident. I reviewed the resident's note and agree with the documented findings and plan of care except as noted below.    Roxanna Mew, MD 08/12/2017 06:30

## 2017-08-12 NOTE — Care Management Notes (Signed)
Received order for FWW.  SW met with pt who is ready for d/c.  He would like FWW ordered for him from [facility].  He and spouse will plan on picking up after d/c.  Order sent to [facility] and call placed to make them aware of above info.  Trudie Reed, LICSW

## 2017-08-12 NOTE — Nurses Notes (Signed)
Patient discharged home with daughter.  AVS reviewed with patient/care giver.  A written copy of the AVS and discharge instructions was given to the patient/care giver.  Questions sufficiently answered as needed.  Patient/care giver encouraged to follow up with PCP as indicated.  In the event of an emergency, patient/care giver instructed to call 911 or go to the nearest emergency room. Escorted to Main Lobby by wheelchair.

## 2017-08-12 NOTE — Nurses Notes (Signed)
Notified Dr. Olen Pel that patient's heart rate between 155 to 165 and sustaining. Ordered 5mg  of Metoprolol IV.

## 2017-08-12 NOTE — Nurses Notes (Signed)
Patient continues to be restless, agitated and having increased anxiety because he is unable to sleep. Notified Dr. Olen Pel that the Melatonin and Trazodone has not helped patient. Stated he would look into something that he could have.

## 2017-08-12 NOTE — Nurses Notes (Signed)
Notified Dr. Olen Pel that patient is more restless, agitated and anxious while sitting at the nurse's station. While talking to the doctor, patient sitting on side of bed, heart rate in the 156 sinus tachycardia, spitting up and coughing and then suddenly vomited in floor. Dr. Olen Pel ordered Ativan 0.5mg  IV and Zofran 4mg  IV.

## 2017-08-12 NOTE — Progress Notes (Signed)
[facility]  FAMILY MEDICINE PROGRESS NOTE      [name], 67 y.o. male  Date of Admission:  08/11/2017  Date of service: 08/12/2017  Date of Birth:  1950/10/06    Hospital Day:  LOS: 0 days     Chief complaint:  [facility] Day:  LOS: 0 days   Brief HPI: [name] is a 67 y.o. White male presenting to the ED with dizziness following ingestion of muscle relaxant and acetaminophen.    Patient was seen on bedside today.  He states that he has feeling well and is "ready to get out of here."Patient was reported to be anxious and agitated overnight; he also had an episode of fall.  Patient says that he is doing well and has been able to eat, have bowel movements and void.  Patient's daughter is on bedside and expressed interest in having her dad lives with her.  However, patient is reluctant to live with his daughter.  Patient feels that he is able to take care of himself at his home.    OBJECTIVE   Vital Signs:  Temp:  [36.4 C (97.6 F)-37.1 C (98.7 F)] 36.8 C (98.3 F) (04/30 0622)  HR:  [46-154] 154 (04/30 0416)  RR:  [15-26] 18 (04/29 2300)  BP: (71-178)/(39-99) 161/99 (04/30 0416)  SpO2:  [96 %-100 %] 97 % (04/29 2300)    Filed Vitals:    08/11/17 2300 08/12/17 0300 08/12/17 0416 08/12/17 0622   BP: (!) 169/94  (!) 161/99    Pulse: 99  (!) 154    Resp: 18      Temp: 36.6 C (97.9 F) 36.4 C (97.6 F)  36.8 C (98.3 F)   SpO2: 97%        Today's Physical Exam:  Physical Exam  General:  Caucasian male, appears stated age, obese, without acute distress  Eyes: Pupils equal and round. Sclera white, conjunctiva clear.   CV:  Mild tachycardia.  Lungs:  No respiratory distress.  No acute tachypnea.  Normal work of breathing.  Abdomen:  Distended from obesity.  Neuro:  Alert.  Oriented to person and time but not place. Grossly normal gait.  No tremors.   Psych:  Easily distracted.  Speech and affect are appropriate.     Intake & Output:  I/O:  +4255  Urine: 1x   BM:  2x    Current Medications:    Current Facility-Administered Medications:  acetylcysteine (ACETADOTE) 10,000 mg in D5W 1,000 mL infusion 10,000 mg Intravenous Once   atorvastatin (LIPITOR) tablet 10 mg Oral QPM   carvedilol (COREG) tablet 3.125 mg Oral 2x/day-Food   enoxaparin (LOVENOX) 40 mg/0.4 mL SubQ injection 40 mg Subcutaneous Daily   levETIRAcetam (KEPPRA) tablet 1,000 mg Oral 2x/day   lidocaine 4% patch 1 Patch Transdermal Daily   lisinopril (PRINIVIL) tablet 20 mg Oral Daily   LORazepam (ATIVAN) 2 mg/mL injection 0.5 mg Intravenous Q4H PRN   melatonin tablet 3 mg Oral HS PRN - MR x 1   nicotine (NICODERM CQ) transdermal patch (mg/24 hr) 21 mg Transdermal Daily   NS flush syringe 10 mL Intravenous Q8HRS   ondansetron (ZOFRAN) 2 mg/mL injection 4 mg Intravenous Q8H PRN   warfarin (COUMADIN) tablet 7.5 mg 7.5 mg Oral S, TU, TH, and SA     Allergies   Allergen Reactions   . Morphine  Other Adverse Reaction (Add comment)     combative  Labs:  CBC Differential   Recent Labs     08/11/17  0935   WBC 8.5   HGB 12.0*   HCT 37.1*   MCV 90.0      No results for input(s): BANDS in the last 72 hours.  Recent Labs     08/11/17  0935   PMNS 70   LYMPHOCYTES 17   MONOCYTES 11   EOSINOPHIL 2   BASOPHILS 1  0.10   PMNABS 5.90   LYMPHSABS 1.40   MONOSABS 0.90   EOSABS 0.10      BMP LFTs   Recent Labs     08/12/17  0629   SODIUM 135*   POTASSIUM 4.2   CHLORIDE 105   CO2 21*   BUN 18   CREATININE 1.03   GLUCOSENF 171*   ANIONGAP 9     Recent Labs     08/12/17  0629   BUNCRRATIO 17   GFR >60   CALCIUM 8.1*   ALBUMIN 2.8*    Corrected Calcium: 8.7 Recent Labs     08/11/17  1202 08/12/17  0629   TOTALPROTEIN  --  6.6   ALBUMIN  --  2.8*   TOTBILIRUBIN  --  1.2   UROBILINOGEN < 1  --    AST  --  56*   ALT  --  39   ALKPHOS  --  173*     No results found for this encounter     Acetaminophen <10     No results found for this encounter     Radiology Results:   Results for orders placed or performed during the hospital encounter of  08/11/17 (from the past 24 hour(s))   XR CHEST AP PORTABLE     Status: None    Narrative    [name]  Male, 67 years old.    XR CHEST AP PORTABLE performed on 08/11/2017 11:52 AM.    REASON FOR EXAM:  overdose    COMPARISON: January 23, 2015    TECHNIQUE: Single view of chest.    FINDINGS:   The patient is status post median sternotomy. The lungs are adequately  inflated. There is no evidence of pneumothorax, pleural effusion or  concerning focal airspace opacity. The cardiac silhouette is stable in size  given differences in technique and patient position. The pulmonary  vasculature is normal in course and caliber. There is normal distal left  clavicle fracture.      Impression    No evidence of acute cardiopulmonary disease.        Radiologist location ID: [id number]     XR KNEE RIGHT AP, LAT & 1 OBLIQUE     Status: None    Narrative    [name]  Male, 67 years old.    XR KNEE RIGHT AP, LAT & 1 OBLIQUE performed on 08/11/2017 11:52 AM.    REASON FOR EXAM:  right knee pain    COMPARISON: None available    TECHNIQUE: AP, lateral and oblique views of the right kidney    FINDINGS:  There is normal bony mineralization. There is normal bony alignment. Thin  calcific densities are redemonstrated within the medial and lateral  compartments. Small marginal osteophytes are redemonstrated at the medial  and lateral tibial plateaus as well as around the patella. No acute  fracture lines identified. Mural vascular calcifications are seen within  the posterior soft tissues.      Impression    1. Chondrocalcinosis.  2. Atherosclerosis.  3. Stable mild osteoarthritic degenerative changes.         Radiologist location ID: [id number]         ASSESSMENT & PLAN   [name] is a 67 y.o. male with PMH of     # Overdose of muscle relaxant  -Poison Control             monitor for symptoms: CNS depression ranging from mild drowsiness to lethargy; agitation and confusion; bradycardia and hypotension  -Zanaflex peak effect:  1-2  hours  -neurochecks q.4 hours    #Acetaminophen overdose  -Poison Control              monitor for symptoms:  Nausea, vomiting, anorexia, diaphoresis, pallor, right upper quadrant pain, jaundice             monitor for liver enzymes: peak approximately 3-5 days post ingestion  -received loading dose of acetylcysteine in the ED  -will receive maintenance dose x2    # hyponatremia-resolved    # tobacco abuse  -on nicotine patch 21 mg daily    # aortic mechanical heart valve with subtherapeutic INR  -target INR 2-3  -INR 1.28-->1.82  -takes 7.5 mg STTS, 5mg   MWF    # insomnia/anxiety  -will start melatonin p.r.n. nightly    #Epilepsy   -Continue Keppra     Consults - none  Hardware (Lines, Drains, Foley, Tubes) - PIV  Diet - Regular  Activity - Up w/ assistance and Up in chair TID w/ all meals  Therapy - None    DVT Risk Factors: Age greater than 40 and Obesity  DVT/PE Prophylaxis: Enoxaparin    DNR Status:  Full Code    Advanced Care Planning - None    Disposition Planning - likely discharge today        Ruben Dutton, MD 08/12/2017, 09:49    PGY-1  [name]   ____________________________________________________________________    I saw and examined the patient and discussed management with the resident. I reviewed the resident's note and agree with the documented findings and plan of care except as noted below:    Ruben Mew, MD 08/12/2017 16:42

## 2017-08-12 NOTE — Discharge Summary (Signed)
[facility]   DISCHARGE SUMMARY      PATIENT NAME:  [name]  MRN:  [id number]  DOB:  01/17/1951    ENCOUNTER START DATE:  08/11/2017  INPATIENT ADMISSION DATE:   DISCHARGE DATE:  08/12/2017    ATTENDING PHYSICIAN: Roxanna Mew, MD  PRIMARY CARE PHYSICIAN: [name], MD     ADMISSION DIAGNOSIS: Overdose of muscle relaxant  Chief Complaint   Patient presents with   . Overdose, Intentional     Pateint arrives EMS, [facility] [place], from home for overdose of Zanaflex. Patietn states he has not slept in 48 hours because of arthritis pain. Patient had prescription filled on 08/05/2017, #90. Bottle was empty per EMS. Patietn is awake with slurred speech at time of triage.        DISCHARGE DIAGNOSIS:   Principal Problem:  Overdose of muscle relaxant    Active Hospital Problems    Diagnosis Date Noted   . Principle Problem: Overdose of muscle relaxant 08/11/2017   . Tobacco abuse 10/30/2015   . Osteoarthritis 07/28/2015   . Joint pain 11/12/2013   . Insomnia 08/25/2013   . Epilepsy ([place]) 11/03/2012   . Aortic Mechanical heart valve present 09/05/2012      Resolved Hospital Problems   No resolved problems to display.     Active Non-Hospital Problems    Diagnosis Date Noted   . Folic acid deficiency anemia 06/20/2016   . Obesity 09/21/2015   . Fatty liver disease, nonalcoholic 43/15/4008   . Elevated liver enzymes 07/28/2015   . OSA (obstructive sleep apnea) 05/05/2015   . Anticoagulation goal of INR 2 to 3 02/04/2014   . Tension headache, chronic 08/25/2013   . 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 H/O diverticulitis of colon s/p partial colectomy 11/03/2012      Allergies   Allergen Reactions   . Morphine  Other Adverse Reaction (Add comment)     combative        DISCHARGE MEDICATIONS:     Current Discharge Medication List      START taking these medications.      Details   diclofenac sodium 1 % Gel  Commonly known as:  VOLTAREN   Apply Topically, 3 TIMES DAILY  Qty:  100 g  Refills:  0           CONTINUE these medications - NO CHANGES were made during your visit.      Details   ASPIR-81 ORAL   Oral  Refills:  0     atorvastatin 10 mg Tablet  Commonly known as:  LIPITOR   10 mg, Oral, DAILY  Qty:  90 Tab  Refills:  1     carvedilol 3.125 mg Tablet  Commonly known as:  COREG   3.125 mg, Oral, 2 TIMES DAILY WITH FOOD  Qty:  180 Tab  Refills:  1     escitalopram oxalate 20 mg Tablet  Commonly known as:  LEXAPRO   20 mg, Oral, DAILY  Qty:  90 Tab  Refills:  3     levETIRAcetam 1,000 mg Tablet  Commonly known as:  KEPPRA   1,000 mg, Oral, 2 TIMES DAILY  Qty:  180 Tab  Refills:  3     ramipril 5 mg Capsule  Commonly known as:  ALTACE   5 mg, Oral, DAILY  Qty:  90 Cap  Refills:  1     raNITIdine 150 mg Tablet  Commonly known as:  ZANTAC   150 mg, Oral, 2 TIMES DAILY  Qty:  180 Tab  Refills:  3     tamsulosin 0.4 mg Capsule  Commonly known as:  FLOMAX   0.4 mg, Oral, EVERY EVENING AFTER DINNER  Qty:  90 Cap  Refills:  3     * warfarin 7.5 mg Tablet  Commonly known as:  COUMADIN   7.5 mg, Oral, EVERY SU, TU, TH AND SA  Qty:  90 Tab  Refills:  2     * warfarin 5 mg Tablet  Commonly known as:  COUMADIN   5 mg, Oral, EVERY MO, WE AND FR  Qty:  90 Tab  Refills:  2         * This list has 2 medication(s) that are the same as other medications prescribed for you. Read the directions carefully, and ask your doctor or other care provider to review them with you.            STOP taking these medications.    tiZANidine 4 mg Tablet  Commonly known as:  ZANAFLEX     traZODone 100 mg Tablet  Commonly known as:  DESYREL            Reason for medications stopped during hospitalization:  Carvedilol and ramipril held the due to hypotension on admission.    DISCHARGE INSTRUCTIONS:      DISCHARGE INSTRUCTION - MISC    -please follow up with Dr. Imelda Pillow in 1-2 weeks  -use Voltaren gel the as directed for knee pain  -please follow up with Orthopedics for evaluation of the right knee     DME - [facility]    Please note - If  patient is 300 lbs or greater please order bariatric or heavy duty items.     Ht 180.3 cm    Wt 125.83 kg    Current Attending: Roxanna Mew, MD    Medical Condition or Diagnosis which is primary reason for equipment: Decreased Mobility secondary to osteoarthritis    Start Date 08/12/2017    Patient has mobility limits that significantly impairs ability to participate in one or more mobility related ADL's (MRADL's): Yes    Moblity Limitations: Pt at heightened risk of injury r/t attempts to fulfill MRADL's & can safely use walker which resolves issue    Walker Type: Front [place] COURSE:  This is a 67 y.o., male with past medical history of epilepsy s/p temporal lobectomy, diverticulitis s/p partial colectomy, aortic mechanical valve on warfarin and anxiety was admitted after ingestion of 40-50 pills of Zanaflex.  Patient stated that his knee pain was not getting any better and decided to take more than the prescribed amount of medication.  Patient also reported taking other pills but could not specify if he was taking naproxen or Tylenol.  Following ingestion, patient felt dizzy but denied any loss of consciousness, headache, chest pain, shortness of breath, palpitation or numbness or tingling to any extremities.  In the ED patient was found to have slurred speech.  His labs indicated acetaminophen level of 42 along with low sodium levels.  Poison Control was contacted regarding patient's overdose of Zanaflex and acetaminophen.  Poison Control suggested starting Mucomyst for the acetaminophen level.  Patient's acetaminophen level and his liver functions were monitored closely and remained stable during the hospitalization.  Patient to see the medicine level decreased prior to the completion of  2nd maintenance dose.  Patient's heart rate and blood pressure were noted; his home blood pressure medications were restarted.  His blood pressure and heart rate  stabilized.  Patient was discharged home with instruction to follow up with primary care.  Patient was also instructed to start taking Voltaren gel for his knee pain.  Patient understood all instructions.  All questions were answered.    CONSULTATIONS:  Poison Control    PROCEDURES PERFORMED: No major procedures performed.    SIGNIFICANT PHYSICAL FINDINGS:   EXAM:  Temperature: 36.8 C (98.2 F)  Heart Rate: (!) 105  BP (Non-Invasive): (!) 153/83  Respiratory Rate: 19  SpO2: 96 %  Pain Score (Numeric, Faces): 0  General: appears chronically ill  Eyes: Pupils equal and round, reactive to light and accomodation.   HENT:ENMT without erythema or injection, mucous membranes moist., Poor dentition  Neck: no thyromegaly or lymphadenopathy  Lungs: Clear to auscultation bilaterally.   Cardiovascular: regular rate and rhythm, S1, S2 normal, no murmur, click, rub or gallop  Abdomen: Soft, non-tender, Bowel sounds normal, non-distended  Genito-urinary: Deferred  Extremities: No cyanosis or edema  Skin: Skin warm and dry  Neurologic: Grossly normal  Lymphatics: No lymphadenopathy  Psychiatric: Normal affect, behavior, memory, thought content, judgement, and speech.    SIGNIFICANT LAB:   Lab Results for Last 24 Hours:    Results for orders placed or performed during the hospital encounter of 08/11/17 (from the past 24 hour(s))   THYROID STIMULATING HORMONE WITH FREE T4 REFLEX   Result Value Ref Range    TSH 1.674 0.340 - 5.330 uIU/mL   COMPREHENSIVE METABOLIC PROFILE - [facility] ONLY   Result Value Ref Range    SODIUM 135 (L) 136 - 145 mmol/L    POTASSIUM 4.2 3.4 - 5.1 mmol/L    CHLORIDE 105 101 - 111 mmol/L    CO2 TOTAL 21 (L) 22 - 32 mmol/L    ANION GAP 9 3 - 11 mmol/L    BUN 18 6 - 20 mg/dL    CREATININE 1.03 0.61 - 1.24 mg/dL    BUN/CREA RATIO 17 6 - 22    ESTIMATED GFR >60 >60 mL/min/1.35m^2    ALBUMIN 2.8 (L) 3.5 - 5.0 g/dL    CALCIUM 8.1 (L) 8.8 - 10.2 mg/dL    GLUCOSE 171 (H) 70 - 110 mg/dL    ALKALINE PHOSPHATASE 173 (H)  38 - 126 U/L    ALT (SGPT) 39 17 - 63 U/L    AST (SGOT) 56 (H) 15 - 41 U/L    BILIRUBIN TOTAL 1.2 0.3 - 1.2 mg/dL    PROTEIN TOTAL 6.6 6.4 - 8.3 g/dL    ALBUMIN/GLOBULIN RATIO 0.7 (L) 0.8 - 2.0   PT/INR daily x 7   Result Value Ref Range    PROTHROMBIN TIME 20.9 (H) 9.2 - 12.3 seconds    INR 1.82    ACETAMINOPHEN LEVEL   Result Value Ref Range    ACETAMINOPHEN LEVEL <10 (L) 10 - 30 ug/mL       SIGNIFICANT RADIOLOGY:     Results for orders placed or performed during the hospital encounter of 08/11/17 (from the past 72 hour(s))   XR CHEST AP PORTABLE     Status: None    Narrative    Felizardo Hoffmann Gammel  Male, 67 years old.    XR CHEST AP PORTABLE performed on 08/11/2017 11:52 AM.    REASON FOR EXAM:  overdose    COMPARISON: January 23, 2015  TECHNIQUE: Single view of chest.    FINDINGS:   The patient is status post median sternotomy. The lungs are adequately  inflated. There is no evidence of pneumothorax, pleural effusion or  concerning focal airspace opacity. The cardiac silhouette is stable in size  given differences in technique and patient position. The pulmonary  vasculature is normal in course and caliber. There is normal distal left  clavicle fracture.      Impression    No evidence of acute cardiopulmonary disease.        Radiologist location ID: [id number]     XR KNEE RIGHT AP, LAT & 1 OBLIQUE     Status: None    Narrative    GAUTHAM HEWINS Federici  Male, 67 years old.    XR KNEE RIGHT AP, LAT & 1 OBLIQUE performed on 08/11/2017 11:52 AM.    REASON FOR EXAM:  right knee pain    COMPARISON: None available    TECHNIQUE: AP, lateral and oblique views of the right kidney    FINDINGS:  There is normal bony mineralization. There is normal bony alignment. Thin  calcific densities are redemonstrated within the medial and lateral  compartments. Small marginal osteophytes are redemonstrated at the medial  and lateral tibial plateaus as well as around the patella. No acute  fracture lines identified. Mural vascular calcifications are  seen within  the posterior soft tissues.      Impression    1. Chondrocalcinosis.  2. Atherosclerosis.  3. Stable mild osteoarthritic degenerative changes.         Radiologist location ID: [id number]         DOES PATIENT HAVE ADVANCED DIRECTIVES:  No, Information Offered and Given    ADVANCED CARE PLANNING - POST form not discussed    CONDITION ON DISCHARGE: Alert, Oriented and VS Stable    DISCHARGE DISPOSITION:  Home discharge     NOTES FOR PCP/FOLLOW UP PHYSICIAN:   -patient's INR was subtherapeutic on admission.  Please adjust warfarin dosage as needed.  -patient was given Voltaren gel for right knee pain.  Patient's Desyrel and Zanaflex were discontinued.  Please evaluate the effectiveness of the Voltaren gel.  -kindly follow up with patient's orthopedic appointment as he may benefit from surgical intervention of his right knee.    Copies sent to Care Team       Relationship Specialty Notifications Start End    [name], MD PCP - General GERIATRIC MEDICINE  06/24/17     Phone: [phone] Fax: [phone]         [address] [place] [address]    Oran Rein, MD PCP - Cardiologist EXTERNAL  09/05/12     Phone: [phone] Fax: [phone]         [place] [id number]          ____________________________________________________________________    I saw and examined the patient and discussed management with the resident. I reviewed the resident's note and agree with the documented findings and plan of care except as noted below:    Roxanna Mew, MD 08/12/2017 16:41

## 2017-08-12 NOTE — Nurses Notes (Signed)
Nurse noticed that patient had taken ECG leads off. Went to check on patient. Nurse noticed patient standing at side of bed. Patient then stumbled and fell to the floor on left side without hitting head. Patient attempted to get back to bed per himself. Assisted patient to bed with assist of 2. Dr. Olen Pel notified and at bedside. Patient wanted to go to bathroom and persisted. Assisted patient to bathroom with assist of 2 then back to bed. Patient is confused. Can state name, date, and month. Patient unable to state that he's at the hospital. Denies any pain. Patient made a 1:1 at 0430.

## 2017-08-12 NOTE — PT Treatment (Signed)
[facility]  [place], [place] [address]    Physical Therapy Initial Evaluation    Date of Admission: 08/11/17    History of Present Illness: Per EMR "[name] is a 67 y.o. male with osteoarthritis, epilepsy, aortic valve disease and hepatitis C who is being admitted to [facility] after he presented to the ED complaining of dizziness soon after he took "50" pills of Zanaflex (Tizanidine) after breakfast this morning. Patient reports that he has arthritis in his knees and has been unable to sleep for over 48 hours secondary to the pain. He was prescribed Tizanidine 4 mg TID PO by his PCP, and that dose was not relieving him. He reports trying more pills (5-6) than prescribed, with still no relief. This morning he was fed up and decided to take around 50 pills. The only symptoms the patient is experiencing are dizziness and "seeing things" only when he closes his eyes. Patient does report that the pain is better at this time. Patient also reports taking either Acetaminophen or Naproxen (2 tabs) this morning, but is unsure which one. Otherwise: (-) fevers, (-) chills, (-) chest pain, (-) nausea, (-) vomiting, (-) shortness of breath, (-) abdominal pain, (-) trauma.  ED Course:   In the ER, vitals on presentation were T: 37.1, HR: 48, BP: 73/38, RR: 14, SpO2: 97%. Since then vitals have improved significantly T: 36.7, HR 59, BP 99/84, RR 15, SpO2 98%. Patient was put on cardiac monitor, had CBC, CMP, EKG, Knee XR, cardiac enzymes, PT/INR, and toxicity levels. Acetaminophen level at 42. Sodium 127. Creatinine 1.27. INR Subtherapeutic at 1.28. Troponin negative. R Knee XR showed mild osteoarthritic changes, as well as chondrocalcinosis and atherosclerosis. Case discussed with poison control. Recommended start patient on N-Acetylcysteine for elevated acetaminophen, and trend acetaminophen level. Patient admitted for further observation, evaluation, and treatment if  necessary."     Precautions: Fall precautions    PMH/PSH:    Past Medical History:   Diagnosis Date   . Abnormal EKG    . Anxiety    . Aortic valve replaced    . Bladder stone    . Depression    . Diverticulitis    . Kidney stones    . Rectal bleeding    . Seizures    . Viral hepatitis C    . Wears glasses      Past Surgical History:   Procedure Laterality Date   . CRANIOTOMY FOR TEMPORAL LOBECTOMY Right     03/2013 for intractible seizures    . CYSTOSCOPY WITH STONE EXTRACTION N/A 08/05/2011    Performed by Mohiuddin, Milta Deiters, MD at [facility]   . HX AORTIC VALVE REPLACEMENT     . HX CHOLECYSTECTOMY     . HX COLECTOMY  2010    at [facility] for diverticulitis.   Marland Kitchen HX COLONOSCOPY     . HX KNEE REPLACMENT Left        Social History / Prior Level of Function: Pt lives alone in a [facility] w/ basement. He was I w/ all mobility and gait w/o AD PTA. He does not own a walker.        Barriers to Learning:  None noted     SUBJECTIVE: Receptive to PT evaluation  OBJECTIVE:  Cognition: A/O  Communication: Clear speech       ROM:  LUE: Grossly WFLs t/o UEs and LEs for mobility assessed   RUE:  LLE:    RLE:    STRENGTH:           LUE:  Grossly WFLs t/o UEs and LEs for mobility assessed    RUE:  LLE:    RLE:    FUNCTIONAL MOBILITY:  Rolling: Independent       Supine to Sit: Independent        Sit to Supine: Independent        Sit to Stand: Independent    Seated Balance: Independent     Standing Balance: Independent    T/f  bed > chair: Independent      AMBULATION: Pt ambulated x 120' w/ RW at an independent level, requesting to use RW at this time d/t mild continued balance deficits from medication overdose. He demonstrated no LOB or safety deficits, however needed occasional cueing to maintain RW closer to self for safety.     Stair Comment: Pt declined      ASSESSMENT: Pt is a 67 yom admitted on 08/11/17 following a medication overdose. Pt presents w/ mild balance deficits associated w/ residual effects  from medication overdose. He is able to demonstrate all mobility w/o assistance, however a RW recommended at least temporarily until he regains baseline balance. Based on current LOF, there are no skilled inpt PT needs noted.     PT Plan of Care:  DC PT    Anticipated rehabilitation needs at discharge:  RW     Total Evaluation Time: 12 minutes      Time may include review of chart notes, obtaining patient's functional history from patient/family/medical staff/case management/ancillary personnel, collaboration on findings and treatment options (with the above mentioned individuals), re-assessment, and acute care rehabilitation.

## 2017-08-12 NOTE — Discharge Instructions (Signed)
Please pick up front wheeled walker at [facility] following discharge.

## 2017-08-12 NOTE — Care Management Notes (Signed)
Received HH order for aid services only.  Spoke with Dr. Izetta Dakin and explained that [facility] has to be skilled service.  Per Dr. Izetta Dakin, pt is not limited in mobility and does not have skilled service.  He needs someone to assist with med management.  Suggested that pt consider using [facility] and their packaging system for daily dosing or find family/friend who can assist.   Trudie Reed, LICSW

## 2017-08-12 NOTE — Nurses Notes (Signed)
Patient has been anxious, restless and agitated that he's not sleeping. Patient states that he takes Trazodone 100mg  at night for sleep. Notified Dr. Olen Pel stated that was fine and he'll put in the order.

## 2017-08-13 ENCOUNTER — Telehealth (INDEPENDENT_AMBULATORY_CARE_PROVIDER_SITE_OTHER): Payer: Self-pay

## 2017-08-13 NOTE — Nursing Note (Signed)
08/13/17 1000   Date and Time of Coordination Contact   Date 08/13/17   Time 1050   Patient Contact   Contact made via Phone   Call Outcome Left message   Additional Interventions   Total # Outreaches 1

## 2017-08-14 ENCOUNTER — Telehealth (INDEPENDENT_AMBULATORY_CARE_PROVIDER_SITE_OTHER): Payer: Self-pay

## 2017-08-14 LAB — ECG 12-LEAD
Calculated R Axis: -33 degrees
PR Interval: 184 ms
QRS Duration: 72 ms
QT Interval: 480 ms
QTC Calculation: 428 ms
Ventricular rate: 48 {beats}/min

## 2017-08-14 NOTE — Nursing Note (Signed)
08/14/17 1600   Date and Time of Coordination Contact   Date 08/14/17   Time 1631   Patient Contact   Call Outcome Left message   Additional Interventions   Total # Outreaches 2

## 2017-08-20 ENCOUNTER — Telehealth (INDEPENDENT_AMBULATORY_CARE_PROVIDER_SITE_OTHER): Payer: Self-pay

## 2017-08-20 NOTE — Nursing Note (Signed)
08/20/17 1300   Date and Time of Coordination Contact   Date 08/20/17   Time 1305   Patient Contact   Contact made via Phone   Clinical Needs   Pt/Caregiver aware Reason for Hosp Yes, reinforced   Hospital Discharge Follow Up Appointment   Reviewed PCP Follow-up Appt Appt scheduled   Additional Interventions   Total # Outreaches 3   Approx. Time Spent (min.) 31-60

## 2017-08-20 NOTE — Nursing Note (Signed)
Spoke with daughter at length regarding patient's current status. States that patient recently had a major life stressor with spouse of 21 years leaving the family. Pt. is alone in the house on a daily basis and often struggles with confusion and behaviors.  Daughter would like to know if there any resources available for patient from a "neuropsych" perspective. Pt. is currently established with neuro and has an upcoming appointment scheduled. She also disscussed having someone available in the home to help cue patient with ADL's and remembering to take medications however, patient is not currently home bound. Informed daughter of programs such as "[facility]" and "[facility] Instead".  Informed that a list can be provided to her of resources available locally. Also made aware of in house therapist available on Tuesdays and Thursdays at PCP's office. Daughter believes that patient is suffering from depression and feels that pt. will be receptive to talking with a therapist. Case management reviewed as another available option. Daughter voiced understanding. Hospital follow up scheduled for next week with Dr. Imelda Pillow. Encouraged to call office with any questions or concerns.     Veneda Melter, RN

## 2017-08-22 ENCOUNTER — Other Ambulatory Visit (INDEPENDENT_AMBULATORY_CARE_PROVIDER_SITE_OTHER): Payer: Self-pay | Admitting: Family Medicine

## 2017-08-26 ENCOUNTER — Encounter (INDEPENDENT_AMBULATORY_CARE_PROVIDER_SITE_OTHER): Payer: Self-pay | Admitting: Family Medicine

## 2017-09-02 ENCOUNTER — Ambulatory Visit (INDEPENDENT_AMBULATORY_CARE_PROVIDER_SITE_OTHER): Payer: Medicare Other

## 2017-09-02 DIAGNOSIS — Z5181 Encounter for therapeutic drug level monitoring: Secondary | ICD-10-CM | POA: Diagnosis not present

## 2017-09-02 DIAGNOSIS — Z952 Presence of prosthetic heart valve: Secondary | ICD-10-CM | POA: Diagnosis not present

## 2017-09-02 DIAGNOSIS — Z7901 Long term (current) use of anticoagulants: Secondary | ICD-10-CM | POA: Diagnosis not present

## 2017-09-02 DIAGNOSIS — F17210 Nicotine dependence, cigarettes, uncomplicated: Secondary | ICD-10-CM

## 2017-09-02 LAB — POCT EAST PROTHROMBIN TIME (AMB)
INR: 2.5
PROTHROMBIN TIME: 30.5

## 2017-09-02 NOTE — Progress Notes (Signed)
[facility]  [address]  [place] [address]       Name: [name] MRN:  [id number]   Date: 09/02/2017 Age: 67 y.o.       Subjective:     [name] is a 67 y.o. male here for warfarin management.      Indication for warfarin: aortic heart valve    Goal: 2.0-3.0   Current dose: 5 mg mwf, 7.5 mg all others  Duration:  Lifetime    Also complains of:      1.   Other medical issues include:  Patient Active Problem List   Diagnosis   . Aortic Mechanical heart valve present   . H/O diverticulitis of colon s/p partial colectomy   . Epilepsy ([place])   . Status post lobectomy of brain   . Tension headache, chronic   . Insomnia   . Joint pain   . Anticoagulation goal of INR 2 to 3   . OSA (obstructive sleep apnea)   . Osteoarthritis   . Elevated liver enzymes   . Fatty liver disease, nonalcoholic   . Obesity   . Tobacco abuse   . Folic acid deficiency anemia   . Overdose of muscle relaxant       Past Medical History:   Diagnosis Date   . Abnormal EKG    . Anxiety    . Aortic valve replaced    . Bladder stone    . Depression    . Diverticulitis    . Kidney stones    . Rectal bleeding    . Seizures    . Viral hepatitis C    . Wears glasses          Past Surgical History:   Procedure Laterality Date   . CRANIOTOMY FOR TEMPORAL LOBECTOMY Right     03/2013 for intractible seizures    . HX AORTIC VALVE REPLACEMENT     . HX CHOLECYSTECTOMY     . HX COLECTOMY  2010    at [facility] for diverticulitis.   Marland Kitchen HX COLONOSCOPY     . HX KNEE REPLACMENT Left          Social History     Socioeconomic History   . Marital status: Married     Spouse name: Olin Hauser   . Number of children: 2   . Years of education: 29   . Highest education level: Not on file   Occupational History   . Occupation: disabled     Employer: [employer]   Tobacco Use   . Smoking status: Current Every Day Smoker     Packs/day: 1.00     Years: 40.00     Pack years: 40.00   . Smokeless tobacco: Never Used   Substance and Sexual Activity   . Alcohol  use: No     Alcohol/week: 0.0 oz     Comment: denied   . Drug use: No   . Sexual activity: Yes     Partners: Female   Other Topics Concern   . Ability to Walk 1 Flight of Steps without SOB/CP Yes   . Ability to Walk 2 Flight of Steps without SOB/CP Yes   . Ability To Do Own ADL's Yes     Allergies   Allergen Reactions   . Morphine  Other Adverse Reaction (Add comment)     combative     Medications reviewed    Since the last visit has  the patient experienced any of the following:    1. Unusual bruising bleeding   No  2. Nosebleeds    No  3. Blood in urine or change in color  No  4. Change in diet    No  (change intake of vitamin K rich foods)  5. Alcohol intake    No  6. Acute illness in past 10 days  No  (i.e. Fever, sore, throat, diarrhea)   7. Missed dose (per patient)   No  8. Falls or injuries    No  9. Symptoms of thromboembolic event No  10. Hospitalization/ER visit   No    Objective:     There were no vitals taken for this visit.         Labs: PT/INR = 30.4/2.5        Assessment/Plan:       1. Anticoagulation   Dose:  Continue current dose  Education:  none    Labs ordered:     Orders Placed This Encounter   . POCT EAST PROTHROMBIN TIME (AMB)    cosigned by Dr. Leonides Schanz    Current Outpatient Medications   Medication Sig   . aspirin (ASPIR-81 ORAL) Take by mouth   . atorvastatin (LIPITOR) 10 mg Oral Tablet Take 1 Tab (10 mg total) by mouth Once a day   . carvedilol (COREG) 3.125 mg Oral Tablet Take 1 Tab (3.125 mg total) by mouth Twice daily with food   . diclofenac sodium (VOLTAREN) 1 % Gel by Apply Topically route Three times a day for 30 days   . escitalopram oxalate (LEXAPRO) 20 mg Oral Tablet Take 1 Tab (20 mg total) by mouth Once a day   . levETIRAcetam (KEPPRA) 1,000 mg Oral Tablet Take 1 Tab (1,000 mg total) by mouth Twice daily   . ramipril (ALTACE) 5 mg Oral Capsule Take 1 Cap (5 mg total) by mouth Once a day   . raNITIdine (ZANTAC) 150 mg Oral Tablet Take 1 Tab (150 mg total) by mouth Twice daily   .  tamsulosin (FLOMAX) 0.4 mg Oral Capsule Take 1 Cap (0.4 mg total) by mouth Every evening after dinner   . warfarin (COUMADIN) 5 mg Oral Tablet Take 1 Tab (5 mg total) by mouth Every Monday, Wednesday and Friday   . warfarin (COUMADIN) 7.5 mg Oral Tablet Take 1 Tab (7.5 mg total) by mouth Every Sunday, Tuesday, Thursday and Saturday       follow up in 4 weeks      Liliana Cline, [facility]  09/02/2017, 13:42               I was present in the clinic at the time of the visit and available for any assistance in the care of the patient.    Primus Bravo, MD  09/02/2017, 16:21

## 2017-09-11 ENCOUNTER — Encounter (INDEPENDENT_AMBULATORY_CARE_PROVIDER_SITE_OTHER): Payer: Self-pay | Admitting: Family Medicine

## 2017-09-11 ENCOUNTER — Ambulatory Visit (INDEPENDENT_AMBULATORY_CARE_PROVIDER_SITE_OTHER): Payer: Medicare Other | Admitting: Family Medicine

## 2017-09-11 ENCOUNTER — Encounter (INDEPENDENT_AMBULATORY_CARE_PROVIDER_SITE_OTHER): Payer: Self-pay

## 2017-09-11 VITALS — Resp 16 | Ht 72.0 in | Wt 274.0 lb

## 2017-09-11 DIAGNOSIS — M1711 Unilateral primary osteoarthritis, right knee: Secondary | ICD-10-CM | POA: Diagnosis not present

## 2017-09-11 DIAGNOSIS — F17210 Nicotine dependence, cigarettes, uncomplicated: Secondary | ICD-10-CM | POA: Diagnosis not present

## 2017-09-11 DIAGNOSIS — M7051 Other bursitis of knee, right knee: Secondary | ICD-10-CM | POA: Diagnosis not present

## 2017-09-11 DIAGNOSIS — M25561 Pain in right knee: Secondary | ICD-10-CM

## 2017-09-11 DIAGNOSIS — M7050 Other bursitis of knee, unspecified knee: Secondary | ICD-10-CM

## 2017-09-11 DIAGNOSIS — G8929 Other chronic pain: Secondary | ICD-10-CM

## 2017-09-11 NOTE — Procedures (Signed)
See progress note for procedure note.

## 2017-09-11 NOTE — Progress Notes (Signed)
[facility]   CLINIC NOTE      CC:    Chief Complaint   Patient presents with   . Knee Pain     right knee pain x 6 months NKI         HPI:   09/11/17:  Patient presents to clinic for right knee pain. Pain duration is 6 months with gradual worsening.  Pain location medial/inferior knee with radiation to the anterior knee.  Pain timing is constant/daily.  Pain severity is moderate.  Pain worse with weight-bearing.  Patient has attempted topical diclofenac which has only helped slightly.  There is some associated soft tissue swelling of the right knee.    ROS:  CONSTITUTIONAL/GENERAL: Negative fevers/chills   MSK: right knee pain   NEUROLOGIC:  Negative numbness/tingling of the right lower extremity.  SKIN: Negative current rash of the right knee.  HEMATOLOGIC: Negative current bruising or bleeding issues of the right knee    HENT, EYES, CARDIOVASCULAR, RESPIRATORY, GASTROINTESTINAL, ALLERGIC systems reviewed and negative or as per HPI.         PMH:   Past Medical History:   Diagnosis Date   . Abnormal EKG    . Anxiety    . Aortic valve replaced    . Bladder stone    . Depression    . Diverticulitis    . Kidney stones    . Rectal bleeding    . Seizures    . Viral hepatitis C    . Wears glasses              FAMILY HISTORY:  Family Medical History:     Problem Relation (Age of Onset)    Atrial fibrillation Brother    COPD Mother    Heart Disease Father    Heart Surgery Father    No Known Problems Sister, Maternal Uncle, Paternal 34, Paternal Uncle, Maternal Grandmother, Maternal Grandfather, Paternal 47, Paternal 55, Son, Daughter    Prostate Cancer Father (44)       Family history of atrial fibrillation, COPD, heart disease, prostate cancer.          SOCIAL HISTORY:  Social History     Tobacco Use   . Smoking status: Current Every Day Smoker     Packs/day: 1.00     Years: 40.00     Pack years: 40.00   . Smokeless tobacco: Never Used   Substance Use Topics   . Alcohol  use: No     Alcohol/week: 0.0 oz     Comment: denied         PHYSICAL EXAM:  Resp 16   Ht 1.829 m (6')   Wt 124.3 kg (274 lb)   BMI 37.16 kg/m   GENERAL:  No acute distress; clean, appropriately dressed; well-nourished  HENT: head normocephalic, atraumatic; nose w/out rhinorrhea/epistaxis; external ear atraumatic; able to hear appropriately   EYES: EOMI; vision intact; no scleral icterus    NECK: no JVD or obvious swelling; trachea midline  CARDIOVASCULAR: no pallor or cyanosis; extremities warm to touch with normal perfusion   RESPIRATORY:  non-labored breathing; symmetric chest wall rise bilat; no audible wheezing  ABDOMEN: no significant distention  NEURO:  Alert and oriented x3; conversant; strength and sensation as per MSK   SKIN:  Clean. Dry and warm to touch. No rash on arms or face  HEME: no diffuse obvious bruising or bleeding (see MSK)  PSYCH: normal affect and mood; appropriate judgement and insight  MSK:  Right knee exam:  Minimal to mild diffuse soft tissue swelling.  Negative deformity, erythema, ecchymosis.  Tenderness to palpation pes anserine area.  Very slight tenderness to palpation medial joint line.  Normal strength with flexion/extension.  Normal sensation to light touch.      IMAGING:   Right knee XR (08/11/17):  IMPRESSION:  1. Chondrocalcinosis.  2. Atherosclerosis.  3. Stable mild osteoarthritic degenerative changes.   --Images were personally reviewed and independently interpreted by me at today's visit. I have also reviewed the radiologist's interpretation.         ASSESSMENT/PLAN:    ICD-10-CM    1. Pes anserine bursitis M70.50 ASPIRATION/INJECTION MAJOR JOINT/BURSA (AMB ONLY)   2. Chronic pain of right knee M25.561 Refer to [facility]    G89.29    3. Primary osteoarthritis of right knee M17.11    4. Right medial knee pain M25.561 ASPIRATION/INJECTION MAJOR JOINT/BURSA (AMB ONLY)       Findings are consistent with right knee primary osteoarthritis, have majority of  patient's pain seems to be coming from pes anserine bursitis.  New issues/diagnosis, initial encounter.  Radiographs as above and reviewed at today's visit.  Patient is limited with what he can take by mouth because he is on Coumadin.  Continue topical diclofenac.  Decision collectively made to proceed with pes anserine bursa steroid injection of the right knee, see procedure note below.  Patient was provided informational handout for home physical therapy.    Follow-up:  2 months.        PROCEDURE NOTE    Procedure:  Right pes anserine bursa steroid injection.  Indication:  Pes anserine bursitis of the right knee causing pain refractory to topical NSAIDs, activity modification.    Allergies reviewed.  Risks, benefits, alternatives were discussed.  Signed informed consent was obtained. Procedure site and patient identification confirmed. Patient was placed in supine position.  Point of maximal tenderness was identified and marked with indentation. Sterile prep performed with Betadine. Using a 25 G 1.5 inch needle that was inserted into the pes anserine bursa, a mixture of 1 cc Kenalog 40 mg/mL and 2 cc lidocaine 1% was injected.  Sterile bandage applied, patient tolerated procedure well, there were no complications.        Catarina Hartshorn, MD    09/11/2017    Portions of this note may be dictated using voice recognition software or a dictation service. Variances in spelling and vocabulary are possible and unintentional. Not all errors are caught/corrected. Please notify the [facility] if any discrepancies are noted or if the meaning of any statement is not clear.

## 2017-09-12 NOTE — Nursing Note (Signed)
09/12/17 1000   Medication Administration   Initials ccw   Medication Name Kenalog   Other Medication lidocaine   Medication Dose 1cc/2cc   Route of Administration Intraarticular   Site Right knee   NDC # [id number]   LOT # [id number]   Expiration date 04/15/19   Manufacturer [place] Yes   Patient Supplied No

## 2017-09-16 ENCOUNTER — Encounter (INDEPENDENT_AMBULATORY_CARE_PROVIDER_SITE_OTHER): Payer: Self-pay | Admitting: Family Medicine

## 2017-09-16 ENCOUNTER — Ambulatory Visit (INDEPENDENT_AMBULATORY_CARE_PROVIDER_SITE_OTHER): Payer: Medicare Other | Admitting: Family Medicine

## 2017-09-16 VITALS — BP 118/68 | HR 91 | Temp 98.4°F | Resp 18 | Ht 72.0 in | Wt 271.5 lb

## 2017-09-16 DIAGNOSIS — T48204D Poisoning by unspecified drugs acting on muscles, undetermined, subsequent encounter: Secondary | ICD-10-CM | POA: Diagnosis not present

## 2017-09-16 DIAGNOSIS — Z720 Tobacco use: Secondary | ICD-10-CM | POA: Diagnosis not present

## 2017-09-16 DIAGNOSIS — M25561 Pain in right knee: Secondary | ICD-10-CM | POA: Diagnosis not present

## 2017-09-16 DIAGNOSIS — Z09 Encounter for follow-up examination after completed treatment for conditions other than malignant neoplasm: Secondary | ICD-10-CM | POA: Diagnosis not present

## 2017-09-16 DIAGNOSIS — G8929 Other chronic pain: Secondary | ICD-10-CM | POA: Diagnosis not present

## 2017-09-16 NOTE — Progress Notes (Signed)
[facility]  [place] [address]  Dept: [phone]  Dept Fax: [phone]  Loc: [phone]  Loc Fax: [phone]    Patient: [name]  MRN: [id number]  Date of birth: [birth year]  Gender: male  Date of service: 09/16/17    Chief Complaint:   Hospital Follow Up    Functional Health Screen        Vital Signs  BP 118/68   Pulse 91   Temp 36.9 C (98.4 F) (Oral)   Resp 18   Ht 1.829 m (6')   Wt 123.2 kg (271 lb 8 oz)   BMI 36.82 kg/m       Social History     Tobacco Use   Smoking Status Current Every Day Smoker   . Packs/day: 1.00   . Years: 40.00   . Pack years: 40.00   Smokeless Tobacco Never Used     Patient Health Rating                          Allergies:  Allergies   Allergen Reactions   . Morphine  Other Adverse Reaction (Add comment)     combative     Medication History  [facility] Team  Patient Care Team:  [name], MD as PCP - General (GERIATRIC MEDICINE)  Oran Rein, MD as PCP - Cardiologist (EXTERNAL)    My clinic staff completed this information and I reviewed the information. I agree with the information.     [name], MD  09/16/2017, 14:09    HPI: Hospital Follow Up  .  67 year old male here for hospital follow-up due to an overdose muscle relaxant.  He was taking a muscle relaxant for his chronic knee pain however he continued to take more than was directed to where he started to have massive side effects and proceeded to the emergency room where he was admitted.  He has seen orthopedic last week and was received an injection.  He is using topical diclofenac.  And he is limited in what he can take due to his warfarin usage.  When asked about the overdosing on the muscle relaxant his answer was well of 67 years old and a little low home [place] life is not what I wanted it to be.  When I asked him if deliberately tried to take his own life through medication he denied that.  He stated that he does skip  taking the medicine to make the pain go away.  His daughter is here lives in [facility] who is very supportive she is quite upset about all of this and asking about assisted living versus him moving to [place].  [name] is seen today for initial visit after discharge from the hospital. He was admitted 08/11/17 and discharged 08/12/17.  Documentation reviewed today:    Discharge summary   reviewed  Tests or treatments that are pending or require follow up: has seen ortho nothing pending   Medication reconciliation was performed and medication education was provided.  Education and care coordination was provided.      Past Medical History  Current Outpatient Medications   Medication Sig   . aspirin (ASPIR-81 ORAL) Take by mouth   . atorvastatin (LIPITOR) 10 mg Oral Tablet Take 1 Tab (10 mg total) by mouth Once a day   . carvedilol (COREG) 3.125 mg Oral Tablet Take 1 Tab (  3.125 mg total) by mouth Twice daily with food   . escitalopram oxalate (LEXAPRO) 20 mg Oral Tablet Take 1 Tab (20 mg total) by mouth Once a day   . levETIRAcetam (KEPPRA) 1,000 mg Oral Tablet Take 1 Tab (1,000 mg total) by mouth Twice daily   . ramipril (ALTACE) 5 mg Oral Capsule Take 1 Cap (5 mg total) by mouth Once a day   . raNITIdine (ZANTAC) 150 mg Oral Tablet Take 1 Tab (150 mg total) by mouth Twice daily   . tamsulosin (FLOMAX) 0.4 mg Oral Capsule Take 1 Cap (0.4 mg total) by mouth Every evening after dinner   . warfarin (COUMADIN) 5 mg Oral Tablet Take 1 Tab (5 mg total) by mouth Every Monday, Wednesday and Friday   . warfarin (COUMADIN) 7.5 mg Oral Tablet Take 1 Tab (7.5 mg total) by mouth Every Sunday, Tuesday, Thursday and Saturday     Allergies   Allergen Reactions   . Morphine  Other Adverse Reaction (Add comment)     combative     Past Medical History:   Diagnosis Date   . Abnormal EKG    . Anxiety    . Aortic valve replaced    . Bladder stone    . Depression    . Diverticulitis    . Kidney stones    . Rectal bleeding    .  Seizures    . Viral hepatitis C    . Wears glasses          Past Surgical History:   Procedure Laterality Date   . CRANIOTOMY FOR TEMPORAL LOBECTOMY Right     12 /2014 for intractible seizures    . HX AORTIC VALVE REPLACEMENT     . HX CHOLECYSTECTOMY     . HX COLECTOMY  2010    at [facility] for diverticulitis.   Marland Kitchen HX COLONOSCOPY     . HX KNEE REPLACMENT Left          Family Medical History:     Problem Relation (Age of Onset)    Atrial fibrillation Brother    COPD Mother    Heart Disease Father    Heart Surgery Father    No Known Problems Sister, Maternal Uncle, Paternal 9, Paternal Uncle, Maternal Grandmother, Maternal Grandfather, Paternal 76, Paternal 60, Son, Daughter    Prostate Cancer Father (27)            Social History     Socioeconomic History   . Marital status: Married     Spouse name: Olin Hauser   . Number of children: 2   . Years of education: 27   . Highest education level: Not on file   Occupational History   . Occupation: disabled     Employer: [employer]   Tobacco Use   . Smoking status: Current Every Day Smoker     Packs/day: 1.00     Years: 40.00     Pack years: 40.00   . Smokeless tobacco: Never Used   Substance and Sexual Activity   . Alcohol use: No     Alcohol/week: 0.0 oz     Comment: denied   . Drug use: No   . Sexual activity: Yes     Partners: Female   Other Topics Concern   . Ability to Walk 1 Flight of Steps without SOB/CP Yes   . Ability to Walk 2 Flight of Steps without SOB/CP Yes   . Ability To Do Own ADL's Yes  BP 118/68   Pulse 91   Temp 36.9 C (98.4 F) (Oral)   Resp 18   Ht 1.829 m (6')   Wt 123.2 kg (271 lb 8 oz)   BMI 36.82 kg/m       Review of Systems -     Constitutional: negative for fevers, chills and weight loss  Eyes: negative for visual disturbance  Ears, nose, mouth, throat, and face: negative for hearing loss, earaches and hoarseness  Respiratory: negative for cough or dyspnea on exertion  Cardiovascular: negative for chest  pressure/discomfort, palpitations, near-syncope, syncope  Gastrointestinal: negative for dysphagia, nausea, vomiting, diarrhea, constipation, abdominal pain and blood in bowel movements  Genitourinary:  negative for frequency, dysuria and hematuria.  The patient denies urinary hesitancy, nocturia, frequency or incomplete voiding.   Integument/breast: negative for rash and changed mole  Hematologic/lymphatic: negative for lymphadenopathy  Musculoskeletal:see HPI  Neurological: negative for seizures and stroke  or TIA symptoms  Behavioral/Psych:  PHQ2 completed  Fall Risk: assessment completed and at moderate risk of falls  Endocrine: negative for diabetic symptoms including polyuria, polydipsia       EXAM:  General Vitals: BP 118/68   Pulse 91   Temp 36.9 C (98.4 F) (Oral)   Resp 18   Ht 1.829 m (6')   Wt 123.2 kg (271 lb 8 oz)   BMI 36.82 kg/m       General: appears chronically ill, appears older than stated age and no distress  Eyes: Pupils equal and round, reactive to light and accomodation. , Conjunctivae/corneas clear, PERRLA, EOM's intact. Marland Kitchen  HENT: Nose without erythema. , Mouth mucous membranes moist , Pharynx without injection or exudate. , No oral lesions.   Neck: No JVD or thyromegaly or lymphadenopathy and thyroid: not enlarged, symmetric, no tenderness/mass/nodules  Lungs: clear to auscultation bilaterally.   Cardiovascular:    Heart regular rate and rhythm, S1, S2 normal, no murmur, click, rub or gallop  Abdomen: soft, non-tender, non-distended, no hepatosplenomegaly, no masses and no hernias  Extremities: no edema, redness or tenderness in the calves   Skin: Skin color, texture, turgor normal. No rashes or lesions  Neurologic: gait is slowed, CN II - XII grossly intact  and alert and oriented x3  Lymphatics: no lymphadenopathy  Psychiatric: affect normal, behavior normal, thought content normal, judgement normal       Assessment and Plan:     ICD-10-CM    1. Hospital discharge follow-up Z09       2. Muscle relaxant overdose, undetermined intent, subsequent encounter T48.204D    3. Chronic pain of right knee M25.561     G89.29      1.  Hospital discharge follow-up/muscle relaxant overdose undetermined intense subsequent encounter:  Discussed with him chest issues as well as safety concerns as far as his medications.  Did explain to him that I do not believe that she believing line at home.  The [facility] living was an option but also an option was to move to [place] to be closer to his daughter so that she can offer him assistance and company.  He did agree to this decision both he and his daughter agreed that it would be better for him to relocate to [place] answered all of his questions the best my ability.  2. Chronic pain of the right knee he will continue the Voltaren gel and will follow up with Ortho as directed.  Discussed the fact that since he had the overdosing that it  was going to be very difficult to prescribe him any type of sedating or controlled substances due to trust issues.  Did discuss with him going to see a psychiatrist or counselor he refuses both.  Again did discuss with the daughter and the patient that I felt that he could no longer live at home alone.  They both verbalized understanding.  Return in about 3 months (around 12/17/2017).    [name], MD  09/16/2017, 14:09
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
## 2017-09-25 ENCOUNTER — Emergency Department
Admission: EM | Admit: 2017-09-25 | Discharge: 2017-09-25 | Disposition: A | Discharge status: Home / Self Care | Payer: Medicare Other | Attending: Physician Assistant | Admitting: Physician Assistant

## 2017-09-25 ENCOUNTER — Emergency Department (EMERGENCY_DEPARTMENT_HOSPITAL): Payer: Medicare Other

## 2017-09-25 DIAGNOSIS — Z8042 Family history of malignant neoplasm of prostate: Secondary | ICD-10-CM | POA: Diagnosis not present

## 2017-09-25 DIAGNOSIS — Z79899 Other long term (current) drug therapy: Secondary | ICD-10-CM | POA: Diagnosis not present

## 2017-09-25 DIAGNOSIS — Z9049 Acquired absence of other specified parts of digestive tract: Secondary | ICD-10-CM | POA: Diagnosis not present

## 2017-09-25 DIAGNOSIS — Z743 Need for continuous supervision: Secondary | ICD-10-CM | POA: Diagnosis not present

## 2017-09-25 DIAGNOSIS — Z825 Family history of asthma and other chronic lower respiratory diseases: Secondary | ICD-10-CM | POA: Diagnosis not present

## 2017-09-25 DIAGNOSIS — F17210 Nicotine dependence, cigarettes, uncomplicated: Secondary | ICD-10-CM | POA: Diagnosis not present

## 2017-09-25 DIAGNOSIS — Z8249 Family history of ischemic heart disease and other diseases of the circulatory system: Secondary | ICD-10-CM | POA: Diagnosis not present

## 2017-09-25 DIAGNOSIS — J189 Pneumonia, unspecified organism: Secondary | ICD-10-CM | POA: Diagnosis not present

## 2017-09-25 DIAGNOSIS — Z87442 Personal history of urinary calculi: Secondary | ICD-10-CM | POA: Diagnosis not present

## 2017-09-25 DIAGNOSIS — Z7901 Long term (current) use of anticoagulants: Secondary | ICD-10-CM | POA: Diagnosis not present

## 2017-09-25 DIAGNOSIS — F329 Major depressive disorder, single episode, unspecified: Secondary | ICD-10-CM | POA: Diagnosis not present

## 2017-09-25 DIAGNOSIS — R569 Unspecified convulsions: Secondary | ICD-10-CM | POA: Diagnosis not present

## 2017-09-25 DIAGNOSIS — Z952 Presence of prosthetic heart valve: Secondary | ICD-10-CM | POA: Diagnosis not present

## 2017-09-25 DIAGNOSIS — R109 Unspecified abdominal pain: Secondary | ICD-10-CM | POA: Diagnosis not present

## 2017-09-25 DIAGNOSIS — M545 Low back pain: Secondary | ICD-10-CM | POA: Diagnosis not present

## 2017-09-25 DIAGNOSIS — Z7982 Long term (current) use of aspirin: Secondary | ICD-10-CM | POA: Diagnosis not present

## 2017-09-25 DIAGNOSIS — J181 Lobar pneumonia, unspecified organism: Secondary | ICD-10-CM | POA: Diagnosis not present

## 2017-09-25 DIAGNOSIS — R918 Other nonspecific abnormal finding of lung field: Secondary | ICD-10-CM | POA: Diagnosis not present

## 2017-09-25 DIAGNOSIS — Z885 Allergy status to narcotic agent status: Secondary | ICD-10-CM | POA: Diagnosis not present

## 2017-09-25 DIAGNOSIS — F419 Anxiety disorder, unspecified: Secondary | ICD-10-CM | POA: Diagnosis not present

## 2017-09-25 MED ORDER — DOXYCYCLINE HYCLATE 100 MG TABLET: 100 mg | Tab | Freq: Two times a day (BID) | ORAL | 0 refills | 0 days | Status: AC

## 2017-09-25 MED ORDER — HYDROCODONE 10 MG-CHLORPHENIRAMINE 8 MG/5 ML ORAL SUSP EXTEND.REL 12HR
5.00 mL | Freq: Two times a day (BID) | ORAL | 0 refills | Status: AC | PRN
Start: 2017-09-25 — End: ?

## 2017-09-25 MED ORDER — KETOROLAC 15 MG/ML INJECTION SOLUTION
15.00 mg | INTRAMUSCULAR | Status: AC
Start: 2017-09-25 — End: 2017-09-25
  Administered 2017-09-25 (×2): 15 mg via INTRAMUSCULAR
  Filled 2017-09-25: qty 1

## 2017-09-25 MED ORDER — CYCLOBENZAPRINE 10 MG TABLET
10.00 mg | ORAL_TABLET | ORAL | Status: AC
Start: 2017-09-25 — End: 2017-09-25
  Administered 2017-09-25: 10 mg via ORAL
  Filled 2017-09-25: qty 1

## 2017-09-25 NOTE — ED Provider Notes (Signed)
[facility]  Emergency Department     HISTORY OF PRESENT ILLNESS     Date:  09/25/2017  Patient's Name:  [name]  Date of Birth:  [birth year]    HPI   This is a 67 year old Male who was escorted to the emergency department for an evaluation of flank pain x 3-4 days. Patient reports right flank pain that radiates up to his collar bone region that onset gradually but is now constant. Denies nausea, vomiting and diarrhea. No known injury but has a "theory" that it may be from getting up and down off the couch. Rates pain 5/10 in severity. Denies urinary symptoms. Has not taken anything for pain at home. Takes Warfarin. Denies any other symptoms. Patient voices no other concerns at this time.    Review of Systems     Review of Systems   Constitutional: Negative for chills and fever.   Eyes: Negative.    Respiratory: Negative.    Cardiovascular: Negative.    Gastrointestinal: Negative for diarrhea, nausea and vomiting.   Genitourinary: Positive for flank pain.   Musculoskeletal: Positive for neck pain.   Skin: Negative for rash.   All other systems reviewed and are negative.    Previous History     Past Medical History:  Past Medical History:   Diagnosis Date   . Abnormal EKG    . Anxiety    . Aortic valve replaced    . Bladder stone    . Depression    . Diverticulitis    . Kidney stones    . Rectal bleeding    . Seizures    . Viral hepatitis C    . Wears glasses        Past Surgical History:  Past Surgical History:   Procedure Laterality Date   . Craniotomy for temporal lobectomy Right    . Hx aortic valve replacement     . Hx cholecystectomy     . Hx colectomy  2010   . Hx colonoscopy     . Hx knee replacment Left        Social History:  Social History     Tobacco Use   . Smoking status: Current Every Day Smoker     Packs/day: 1.00     Years: 40.00     Pack years: 40.00   . Smokeless tobacco: Never Used   Substance Use Topics   . Alcohol use: No     Alcohol/week: 0.0 oz      Comment: denied   . Drug use: No     Social History     Substance and Sexual Activity   Drug Use No       Family History:  Family History   Problem Relation Age of Onset   . COPD Mother    . Heart Surgery Father         CABG   . Prostate Cancer Father 83   . Heart Disease Father    . No Known Problems Sister    . Atrial fibrillation Brother    . No Known Problems Maternal Uncle    . No Known Problems Paternal Aunt    . No Known Problems Paternal Uncle    . No Known Problems Maternal Grandmother    . No Known Problems Maternal Grandfather    . No Known Problems Paternal Grandmother    . No Known Problems Paternal Grandfather    . No  Known Problems Son    . No Known Problems Daughter        Medication History:  Current Outpatient Medications   Medication Sig   . aspirin (ASPIR-81 ORAL) Take by mouth   . atorvastatin (LIPITOR) 10 mg Oral Tablet Take 1 Tab (10 mg total) by mouth Once a day   . carvedilol (COREG) 3.125 mg Oral Tablet Take 1 Tab (3.125 mg total) by mouth Twice daily with food   . chlorpheniramine-HYDROcodone (TUSSIONEX) 10-8 mg/5 mL Oral Suspension, Sust.Release 12 hr Take 5 mL by mouth Every 12 hours as needed for Cough   . doxycycline 100 mg Oral Tablet Take 1 Tab (100 mg total) by mouth Twice daily   . escitalopram oxalate (LEXAPRO) 20 mg Oral Tablet Take 1 Tab (20 mg total) by mouth Once a day   . levETIRAcetam (KEPPRA) 1,000 mg Oral Tablet Take 1 Tab (1,000 mg total) by mouth Twice daily   . ramipril (ALTACE) 5 mg Oral Capsule Take 1 Cap (5 mg total) by mouth Once a day   . raNITIdine (ZANTAC) 150 mg Oral Tablet Take 1 Tab (150 mg total) by mouth Twice daily   . tamsulosin (FLOMAX) 0.4 mg Oral Capsule Take 1 Cap (0.4 mg total) by mouth Every evening after dinner   . warfarin (COUMADIN) 5 mg Oral Tablet Take 1 Tab (5 mg total) by mouth Every Monday, Wednesday and Friday   . warfarin (COUMADIN) 7.5 mg Oral Tablet Take 1 Tab (7.5 mg total) by mouth Every Sunday, Tuesday, Thursday and Saturday              Allergies:  Allergies   Allergen Reactions   . Morphine  Other Adverse Reaction (Add comment)     combative       Physical Exam     Vitals:    BP 121/69   Pulse 75   Temp 36.6 C (97.9 F)   Resp 20   Ht 1.829 m (6')   Wt 122.9 kg (271 lb)   SpO2 96%   BMI 36.75 kg/m         Physical Exam   Constitutional: He is oriented to person, place, and time. He appears well-developed and well-nourished. No distress.   HENT:   Head: Normocephalic and atraumatic.   Eyes: Conjunctivae are normal.   Neck: Normal range of motion.   Cardiovascular: Normal rate and regular rhythm.   Pulmonary/Chest: Effort normal and breath sounds normal. He exhibits tenderness.       Abdominal: Soft.   Musculoskeletal: Normal range of motion.   Neurological: He is alert and oriented to person, place, and time.   Skin: Skin is warm and dry.   Nursing note and vitals reviewed.      Diagnostic Studies/Treatment     Medications:  Medications   cyclobenzaprine (FLEXERIL) tablet (10 mg Oral Given 09/25/17 1921)   ketorolac (TORADOL) 15 mg/mL injection (15 mg Intramuscular Given 09/25/17 2009)       Discharge Medication List as of 09/25/2017  8:45 PM      START taking these medications    Details   chlorpheniramine-HYDROcodone (TUSSIONEX) 10-8 mg/5 mL Oral Suspension, Sust.Release 12 hr Take 5 mL by mouth Every 12 hours as needed for Cough, Disp-100 mL, R-0, Print      doxycycline 100 mg Oral Tablet Take 1 Tab (100 mg total) by mouth Twice daily, Disp-20 Tab, R-0, Print             Labs:  No results found for any visits on 09/25/17.    Radiology:  XR RIBS RIGHT W/ PA CHEST    XR RIBS RIGHT W/ PA CHEST    (Results Pending)       ECG:  NONE      Procedure     Procedures    Course/Disposition/Plan     Course:    18:58: Initial evaluation is complete at this time. I discussed with the patient that I would order a blood work, troponin, IVF, and chest xray to further evaluate. I will monitor patient's progress throughout treatment. Patient is  agreeable with the treatment plan at this time.    Patient's improvement discomfort with muscle relaxer and Toradol.  A patient's rib series sent for wet read and read as right middle lobe infiltrate.  Will treat patient for pneumonia with azithromycin and prescribe him Tussionex for his discomfort and cough suppression.        Disposition:    Discharged    Condition at Disposition:   Stable      Follow up:   [name], MD  [address]  [place] [address]  [phone]            Clinical Impression:     Encounter Diagnosis   Name Primary?   . Right middle lobe pneumonia (CMS [place]) Yes       Future Appointments Scheduled in Epic:  Future Appointments   Date Time Provider Lafe   09/30/2017  1:40 PM Liliana Cline, PHARMD [facility]   10/23/2017  1:00 PM Sprouse, Dara Lords, MD [facility]   12/25/2017 12:45 PM Docia Furl, MD [facility]       SCRIBE ATTESTATION     This note is prepared by Jeannie Fend, acting as Scribe for Arnaldo Natal, PA.    The scribe's documentation has been prepared under my direction and personally reviewed by me in its entirety.  I confirm that the note above accurately reflects all work, treatment, procedures, and medical decision making performed by me, Arnaldo Natal, PA.

## 2017-09-25 NOTE — ED Triage Notes (Signed)
Pt arrives by EMS (amb 5-1, report from [place]) complains of 3 days of right flank pain. Denies N/V/D. Has had bladder stones in past but never kidney stone. Denies urinary symptoms.

## 2017-09-25 NOTE — ED Nurses Note (Signed)
Pt to doorway complaining of pain, states he has been in pain at home and here, and we haven't done anything for him. Pt updated that he got his XR completed, and medicated at [place]. Reevaluation of pain 8/10, just prior to administration of IM medication. Pt updated on plan of care. No further needs at this time. Pt sitting on side of bed awaiting for results.

## 2017-09-25 NOTE — ED Nurses Note (Signed)
Patient discharged home with family.  AVS reviewed with patient/care giver.  A written copy of the AVS and discharge instructions was given to the patient/care giver.  Questions sufficiently answered as needed.  Patient/care giver encouraged to follow up with PCP as indicated.  In the event of an emergency, patient/care giver instructed to call 911 or go to the nearest emergency room.     Pt given prescriptions x2, ambulatory from ED with steady gait

## 2017-09-30 ENCOUNTER — Encounter (INDEPENDENT_AMBULATORY_CARE_PROVIDER_SITE_OTHER): Payer: Self-pay

## 2017-10-02 ENCOUNTER — Other Ambulatory Visit (INDEPENDENT_AMBULATORY_CARE_PROVIDER_SITE_OTHER): Payer: Self-pay

## 2017-10-23 ENCOUNTER — Encounter (INDEPENDENT_AMBULATORY_CARE_PROVIDER_SITE_OTHER): Payer: Self-pay | Admitting: Family Medicine

## 2017-10-28 ENCOUNTER — Emergency Department (HOSPITAL_COMMUNITY): Payer: Medicare Other

## 2017-10-28 ENCOUNTER — Other Ambulatory Visit: Payer: Self-pay

## 2017-10-28 ENCOUNTER — Inpatient Hospital Stay (HOSPITAL_COMMUNITY)
Admission: EM | Admit: 2017-10-28 | Discharge: 2017-11-04 | DRG: 281 | Disposition: A | Discharge status: Home / Self Care | Payer: Medicare Other | Attending: Internal Medicine | Admitting: Internal Medicine

## 2017-10-28 ENCOUNTER — Encounter (HOSPITAL_COMMUNITY): Payer: Self-pay | Admitting: Emergency Medicine

## 2017-10-28 DIAGNOSIS — R0602 Shortness of breath: Secondary | ICD-10-CM | POA: Diagnosis not present

## 2017-10-28 DIAGNOSIS — R739 Hyperglycemia, unspecified: Secondary | ICD-10-CM

## 2017-10-28 DIAGNOSIS — Z7901 Long term (current) use of anticoagulants: Secondary | ICD-10-CM

## 2017-10-28 DIAGNOSIS — G9340 Encephalopathy, unspecified: Secondary | ICD-10-CM

## 2017-10-28 DIAGNOSIS — G9389 Other specified disorders of brain: Secondary | ICD-10-CM | POA: Diagnosis present

## 2017-10-28 DIAGNOSIS — Z952 Presence of prosthetic heart valve: Secondary | ICD-10-CM | POA: Diagnosis not present

## 2017-10-28 DIAGNOSIS — Z9989 Dependence on other enabling machines and devices: Secondary | ICD-10-CM

## 2017-10-28 DIAGNOSIS — R402360 Coma scale, best motor response, obeys commands, unspecified time: Secondary | ICD-10-CM | POA: Diagnosis present

## 2017-10-28 DIAGNOSIS — N400 Enlarged prostate without lower urinary tract symptoms: Secondary | ICD-10-CM | POA: Diagnosis present

## 2017-10-28 DIAGNOSIS — I77819 Aortic ectasia, unspecified site: Secondary | ICD-10-CM | POA: Diagnosis present

## 2017-10-28 DIAGNOSIS — G4733 Obstructive sleep apnea (adult) (pediatric): Secondary | ICD-10-CM | POA: Diagnosis present

## 2017-10-28 DIAGNOSIS — F419 Anxiety disorder, unspecified: Secondary | ICD-10-CM | POA: Diagnosis present

## 2017-10-28 DIAGNOSIS — R791 Abnormal coagulation profile: Secondary | ICD-10-CM | POA: Diagnosis present

## 2017-10-28 DIAGNOSIS — I214 Non-ST elevation (NSTEMI) myocardial infarction: Principal | ICD-10-CM | POA: Diagnosis present

## 2017-10-28 DIAGNOSIS — F17210 Nicotine dependence, cigarettes, uncomplicated: Secondary | ICD-10-CM | POA: Diagnosis present

## 2017-10-28 DIAGNOSIS — R001 Bradycardia, unspecified: Secondary | ICD-10-CM | POA: Diagnosis not present

## 2017-10-28 DIAGNOSIS — G40909 Epilepsy, unspecified, not intractable, without status epilepticus: Secondary | ICD-10-CM | POA: Diagnosis present

## 2017-10-28 DIAGNOSIS — Z6836 Body mass index (BMI) 36.0-36.9, adult: Secondary | ICD-10-CM

## 2017-10-28 DIAGNOSIS — E1165 Type 2 diabetes mellitus with hyperglycemia: Secondary | ICD-10-CM | POA: Diagnosis not present

## 2017-10-28 DIAGNOSIS — I444 Left anterior fascicular block: Secondary | ICD-10-CM | POA: Diagnosis present

## 2017-10-28 DIAGNOSIS — I5032 Chronic diastolic (congestive) heart failure: Secondary | ICD-10-CM | POA: Diagnosis present

## 2017-10-28 DIAGNOSIS — R7303 Prediabetes: Secondary | ICD-10-CM

## 2017-10-28 DIAGNOSIS — R569 Unspecified convulsions: Secondary | ICD-10-CM

## 2017-10-28 DIAGNOSIS — J449 Chronic obstructive pulmonary disease, unspecified: Secondary | ICD-10-CM | POA: Diagnosis not present

## 2017-10-28 DIAGNOSIS — R069 Unspecified abnormalities of breathing: Secondary | ICD-10-CM | POA: Diagnosis not present

## 2017-10-28 DIAGNOSIS — Z79899 Other long term (current) drug therapy: Secondary | ICD-10-CM

## 2017-10-28 DIAGNOSIS — R402140 Coma scale, eyes open, spontaneous, unspecified time: Secondary | ICD-10-CM | POA: Diagnosis present

## 2017-10-28 DIAGNOSIS — I25110 Atherosclerotic heart disease of native coronary artery with unstable angina pectoris: Secondary | ICD-10-CM | POA: Diagnosis present

## 2017-10-28 DIAGNOSIS — I200 Unstable angina: Secondary | ICD-10-CM | POA: Diagnosis present

## 2017-10-28 DIAGNOSIS — I472 Ventricular tachycardia: Secondary | ICD-10-CM | POA: Diagnosis present

## 2017-10-28 DIAGNOSIS — Z23 Encounter for immunization: Secondary | ICD-10-CM

## 2017-10-28 DIAGNOSIS — R079 Chest pain, unspecified: Secondary | ICD-10-CM | POA: Diagnosis not present

## 2017-10-28 DIAGNOSIS — E871 Hypo-osmolality and hyponatremia: Secondary | ICD-10-CM | POA: Diagnosis present

## 2017-10-28 DIAGNOSIS — Z9089 Acquired absence of other organs: Secondary | ICD-10-CM

## 2017-10-28 DIAGNOSIS — R0789 Other chest pain: Secondary | ICD-10-CM | POA: Diagnosis not present

## 2017-10-28 DIAGNOSIS — Z9660 Presence of unspecified orthopedic joint implant: Secondary | ICD-10-CM | POA: Diagnosis present

## 2017-10-28 DIAGNOSIS — E785 Hyperlipidemia, unspecified: Secondary | ICD-10-CM | POA: Diagnosis present

## 2017-10-28 DIAGNOSIS — E669 Obesity, unspecified: Secondary | ICD-10-CM | POA: Diagnosis present

## 2017-10-28 DIAGNOSIS — I110 Hypertensive heart disease with heart failure: Secondary | ICD-10-CM | POA: Diagnosis present

## 2017-10-28 DIAGNOSIS — F329 Major depressive disorder, single episode, unspecified: Secondary | ICD-10-CM | POA: Diagnosis present

## 2017-10-28 DIAGNOSIS — R197 Diarrhea, unspecified: Secondary | ICD-10-CM | POA: Diagnosis not present

## 2017-10-28 DIAGNOSIS — R402250 Coma scale, best verbal response, oriented, unspecified time: Secondary | ICD-10-CM | POA: Diagnosis present

## 2017-10-28 DIAGNOSIS — E119 Type 2 diabetes mellitus without complications: Secondary | ICD-10-CM | POA: Diagnosis present

## 2017-10-28 HISTORY — DX: Presence of prosthetic heart valve: Z95.2

## 2017-10-28 HISTORY — DX: Chronic obstructive pulmonary disease, unspecified: J44.9

## 2017-10-28 HISTORY — DX: Prediabetes: R73.03

## 2017-10-28 HISTORY — DX: Unspecified convulsions: R56.9

## 2017-10-28 LAB — BASIC METABOLIC PANEL
Anion gap: 8 (ref 5–15)
BUN: 20 mg/dL (ref 8–23)
CO2: 24 mmol/L (ref 22–32)
Calcium: 8.5 mg/dL — ABNORMAL LOW (ref 8.9–10.3)
Chloride: 98 mmol/L (ref 98–111)
Creatinine, Ser: 1.07 mg/dL (ref 0.61–1.24)
GFR calc Af Amer: >60 mL/min (ref >60)
GFR calc non Af Amer: >60 mL/min (ref >60)
Glucose, Bld: 395 mg/dL — ABNORMAL HIGH (ref 70–99)
Potassium: 4.2 mmol/L (ref 3.5–5.1)
Sodium: 130 mmol/L — ABNORMAL LOW (ref 135–145)

## 2017-10-28 LAB — URINALYSIS, ROUTINE W REFLEX MICROSCOPIC
Bacteria, UA: NONE SEEN
Bilirubin Urine: NEGATIVE
Glucose, UA: >=500 mg/dL — AB
Ketones, ur: NEGATIVE mg/dL
Leukocytes, UA: NEGATIVE
Nitrite: NEGATIVE
Protein, ur: NEGATIVE mg/dL
Specific Gravity, Urine: 1.031 — ABNORMAL HIGH (ref 1.005–1.030)
pH: 6 (ref 5.0–8.0)

## 2017-10-28 LAB — CBC WITH DIFFERENTIAL/PLATELET
Basophils Absolute: 0 10*3/uL (ref 0.0–0.1)
Basophils Relative: 0 %
Eosinophils Absolute: 0.1 10*3/uL (ref 0.0–0.7)
Eosinophils Relative: 1 %
HCT: 40.5 % (ref 39.0–52.0)
Hemoglobin: 13.7 g/dL (ref 13.0–17.0)
Lymphocytes Relative: 11 %
Lymphs Abs: 1.1 10*3/uL (ref 0.7–4.0)
MCH: 28.4 pg (ref 26.0–34.0)
MCHC: 33.8 g/dL (ref 30.0–36.0)
MCV: 84 fL (ref 78.0–100.0)
Monocytes Absolute: 0.7 10*3/uL (ref 0.1–1.0)
Monocytes Relative: 7 %
Neutro Abs: 8 10*3/uL — ABNORMAL HIGH (ref 1.7–7.7)
Neutrophils Relative %: 81 %
Platelets: 145 10*3/uL — ABNORMAL LOW (ref 150–400)
RBC: 4.82 MIL/uL (ref 4.22–5.81)
RDW: 16.1 % — ABNORMAL HIGH (ref 11.5–15.5)
WBC: 9.9 10*3/uL (ref 4.0–10.5)

## 2017-10-28 LAB — TROPONIN I
Troponin I: <0.03 ng/mL (ref <0.03)
Troponin I: 0.04 ng/mL (ref <0.03)
Troponin I: 0.63 ng/mL (ref <0.03)

## 2017-10-28 LAB — GLUCOSE, CAPILLARY: Glucose-Capillary: 139 mg/dL — ABNORMAL HIGH (ref 70–99)

## 2017-10-28 LAB — CBG MONITORING, ED: Glucose-Capillary: 263 mg/dL — ABNORMAL HIGH (ref 70–99)

## 2017-10-28 LAB — PROTIME-INR
INR: 1.17
Prothrombin Time: 14.8 seconds (ref 11.4–15.2)

## 2017-10-28 MED ORDER — CARVEDILOL 3.125 MG PO TABS
3.1250 mg | ORAL_TABLET | Freq: Two times a day (BID) | ORAL | Status: DC
  Administered 2017-10-29 – 2017-10-30 (×4): 3.125 mg via ORAL
  Filled 2017-10-28 (×4): qty 1

## 2017-10-28 MED ORDER — LEVETIRACETAM 250 MG PO TABS
1000.0000 mg | ORAL_TABLET | Freq: Two times a day (BID) | ORAL | Status: DC
  Administered 2017-10-29 (×2): 1000 mg via ORAL
  Filled 2017-10-28: qty 2
  Filled 2017-10-28: qty 4

## 2017-10-28 MED ORDER — SODIUM CHLORIDE 0.9 % IV BOLUS
1000.0000 mL | Freq: Once | INTRAVENOUS | Status: AC
  Administered 2017-10-28: 1000 mL via INTRAVENOUS

## 2017-10-28 MED ORDER — ONDANSETRON HCL 4 MG/2ML IJ SOLN
4.0000 mg | Freq: Four times a day (QID) | INTRAMUSCULAR | Status: DC | PRN
  Filled 2017-10-28: qty 2

## 2017-10-28 MED ORDER — ACETAMINOPHEN 325 MG PO TABS: 650.0000 mg | ORAL_TABLET | ORAL | Status: DC | PRN

## 2017-10-28 MED ORDER — INSULIN ASPART 100 UNIT/ML ~~LOC~~ SOLN: 0.0000 [IU] | Freq: Every day | SUBCUTANEOUS | Status: DC

## 2017-10-28 MED ORDER — WARFARIN SODIUM 5 MG PO TABS: 10.0000 mg | ORAL_TABLET | Freq: Once | ORAL | Status: DC

## 2017-10-28 MED ORDER — WARFARIN SODIUM 5 MG PO TABS: 5.0000 mg | ORAL_TABLET | Freq: Every day | ORAL | Status: DC

## 2017-10-28 MED ORDER — WARFARIN SODIUM 2.5 MG PO TABS
2.5000 mg | ORAL_TABLET | Freq: Once | ORAL | Status: AC
  Administered 2017-10-29: 2.5 mg via ORAL
  Filled 2017-10-28: qty 1

## 2017-10-28 MED ORDER — INSULIN ASPART 100 UNIT/ML ~~LOC~~ SOLN
0.0000 [IU] | Freq: Three times a day (TID) | SUBCUTANEOUS | Status: DC
  Administered 2017-10-29: 2 [IU] via SUBCUTANEOUS
  Administered 2017-10-29: 3 [IU] via SUBCUTANEOUS
  Administered 2017-10-29 – 2017-10-30 (×2): 2 [IU] via SUBCUTANEOUS
  Administered 2017-10-30 – 2017-11-01 (×7): 1 [IU] via SUBCUTANEOUS
  Administered 2017-11-01 – 2017-11-02 (×3): 2 [IU] via SUBCUTANEOUS
  Administered 2017-11-02 – 2017-11-03 (×4): 1 [IU] via SUBCUTANEOUS

## 2017-10-28 MED ORDER — ENOXAPARIN SODIUM 120 MG/0.8ML ~~LOC~~ SOLN
120.0000 mg | Freq: Two times a day (BID) | SUBCUTANEOUS | Status: DC
  Administered 2017-10-29: 120 mg via SUBCUTANEOUS
  Filled 2017-10-28 (×2): qty 0.8

## 2017-10-28 MED ORDER — ATORVASTATIN CALCIUM 10 MG PO TABS
10.0000 mg | ORAL_TABLET | Freq: Every day | ORAL | Status: DC
  Administered 2017-10-29 – 2017-11-04 (×7): 10 mg via ORAL
  Filled 2017-10-28 (×7): qty 1

## 2017-10-28 MED ORDER — WARFARIN SODIUM 7.5 MG PO TABS: 7.5000 mg | ORAL_TABLET | Freq: Every day | ORAL | Status: DC

## 2017-10-28 MED ORDER — RAMIPRIL 5 MG PO CAPS
5.0000 mg | ORAL_CAPSULE | Freq: Every day | ORAL | Status: DC
  Administered 2017-10-29 – 2017-11-04 (×7): 5 mg via ORAL
  Filled 2017-10-28 (×7): qty 1

## 2017-10-28 MED ORDER — WARFARIN - PHARMACIST DOSING INPATIENT: Freq: Every day | Status: DC

## 2017-10-28 MED ORDER — TAMSULOSIN HCL 0.4 MG PO CAPS
0.4000 mg | ORAL_CAPSULE | Freq: Every evening | ORAL | Status: DC
  Administered 2017-10-29 – 2017-11-04 (×8): 0.4 mg via ORAL
  Filled 2017-10-28 (×8): qty 1

## 2017-10-28 MED ORDER — PNEUMOCOCCAL VAC POLYVALENT 25 MCG/0.5ML IJ INJ
0.5000 mL | INJECTION | INTRAMUSCULAR | Status: AC
  Administered 2017-11-01: 0.5 mL via INTRAMUSCULAR

## 2017-10-28 MED ORDER — ESCITALOPRAM OXALATE 10 MG PO TABS
20.0000 mg | ORAL_TABLET | Freq: Every day | ORAL | Status: DC
  Administered 2017-10-29 – 2017-11-04 (×7): 20 mg via ORAL
  Filled 2017-10-28 (×7): qty 2

## 2017-10-28 NOTE — ED Notes (Signed)
Date and time results received: 10/28/17 1708  Test: Troponin Critical Value: 0.04  Name of Provider Notified: Dr. Alvino Chapel  Orders Received? Or Actions Taken?: See chart

## 2017-10-28 NOTE — ED Notes (Signed)
Pt ambulatory to restroom

## 2017-10-28 NOTE — Progress Notes (Addendum)
ANTICOAGULATION CONSULT NOTE - Initial Consult  Pharmacy Consult for Lovenox and Coumadin Indication: mechanical valve  No Known Allergies  Patient Measurements: Height: 6' (182.9 cm) Weight: 270 lb 3.2 oz (122.6 kg) IBW/kg (Calculated) : 77.6  Vital Signs: Temp: 97.5 F (36.4 C) (07/16 2015) Temp Source: Oral (07/16 2015) BP: 136/73 (07/16 2015) Pulse Rate: 69 (07/16 2015)  Labs: Recent Labs    10/28/17 1313 10/28/17 1605  HGB 13.7  --   HCT 40.5  --   PLT 145*  --   LABPROT 14.8  --   INR 1.17  --   CREATININE 1.07  --   TROPONINI <0.03 0.04*    Estimated Creatinine Clearance: 90.6 mL/min (by C-G formula based on SCr of 1.07 mg/dL).   Medical History: Past Medical History:  Diagnosis Date  . COPD (chronic obstructive pulmonary disease) ([place])   . Mechanical heart valve present   . Pre-diabetes   . Seizures ([place])     Medications:  Medications Prior to Admission  Medication Sig Dispense Refill Last Dose  . atorvastatin (LIPITOR) 10 MG tablet Take 1 tablet by mouth daily.   10/28/2017 at Unknown time  . carvedilol (COREG) 3.125 MG tablet Take 1 tablet by mouth 2 (two) times daily.   10/28/2017 at 800  . escitalopram (LEXAPRO) 20 MG tablet Take 1 tablet by mouth daily.   10/28/2017 at Unknown time  . levETIRAcetam (KEPPRA) 1000 MG tablet Take 1 tablet by mouth 2 (two) times daily.   10/28/2017 at Unknown time  . ramipril (ALTACE) 5 MG capsule Take 1 capsule by mouth daily.   10/28/2017 at Unknown time  . ranitidine (ZANTAC) 150 MG tablet Take 1 tablet by mouth 2 (two) times daily.   10/28/2017 at Unknown time  . tamsulosin (FLOMAX) 0.4 MG CAPS capsule Take 1 capsule by mouth every evening.   10/27/2017 at Unknown time  . warfarin (COUMADIN) 5 MG tablet Take 1 tablet by mouth daily. Every Monday, Wednesday and Friday   10/27/2017 at Unknown time  . warfarin (COUMADIN) 7.5 MG tablet Take 7.5 mg by mouth daily. Every Sunday, Tuesday, Thursday and Saturday   10/28/2017 at 830     Assessment: 67 yo male who presented to ED with left sided chest pain. He is on chronic coumadin for aortic valve. Pharmacy asked to dose Coumadin. Patients INR is subtherapeutic and has missed several doses over the last few days. Will bridge with lovenox. Will give booster dose today to make total of 10mg . (pt took 7.5mg  this AM) Usual home dose is 5mg  on MWF, and 7.5 all other days. Goal of Therapy:  INR 2-3 Monitor platelets by anticoagulation protocol: Yes   Plan:  lovenox 1mg /kg (120mg ) sq q12h Coumadin 2.5mg  x 1 dose this evening PT-INR daily Monitor  CBC and watch for S/S of bleeding  Isac Sarna, BS Vena [place], BCPS Clinical Pharmacist Pager [phone] 10/28/2017,8:52 PM

## 2017-10-28 NOTE — ED Notes (Signed)
Patient transported to X-ray 

## 2017-10-28 NOTE — ED Notes (Signed)
Pt continues to remove leads, BP cuff, and pulse oximeter after being instructed not to do so.

## 2017-10-28 NOTE — ED Triage Notes (Signed)
Pt c/o LT sided chest pressure that radiates down LT arm that began approx 2 hours ago. Daughter reported that pt felt "clammy." Pt denies SOB, dizziness, or nausea. Pt had 2 Nitro and 324 ASA by EMS. Pt reports mild relief with medication.

## 2017-10-28 NOTE — Progress Notes (Addendum)
CRITICAL VALUE ALERT  Critical Value:  Troponin 0.63. Patient denies chest pain. No distress noted.  Date & Time Notied:  10/28/17 2153  Provider Notified: Baltazar Najjar, NP 10/28/17 2154  Orders Received/Actions taken: Baltazar Najjar, NP responded via telephone, asking if patient c/o chest pain. Notified patient denies chest pain. No new orders at this time.

## 2017-10-28 NOTE — H&P (Signed)
History and Physical    Adam Savage HMC:[id number] DOB: 02-Jul-1950 DOA: 10/28/2017  PCP: Patient, [name]  Patient coming from: Home  Chief Complaint: Chest pain  HPI: Adam Savage is a 67 y.o. male with medical history significant of aortic valve replacement around 2004 on Coumadin, seizure disorder, status post partial lobectomy secondary to his seizures prediabetes, COPD is here out of town visiting his family and grandkids when he started complaining of substernal chest pain earlier today that lasted less than 2 hours.  Patient reports pain radiated down his left arm.  Pain relieved by nitroglycerin.  No associated shortness of breath with diaphoretic.  Does not have history of heartburn.  No fevers no cough no shortness of breath.  Pain is completely resolved now.  Also of note family reported some confusion earlier patient renal status appears normal.  He reports he takes his medications however family found his pill bottles with uncertainty of whether or not he has been taken his meds.  His INR is 1.3.  Patient be referred for admission for chest pain.   Review of Systems: As per HPI otherwise 10 point review of systems negative.   Past Medical History:  Diagnosis Date  . COPD (chronic obstructive pulmonary disease) ([place])   . Mechanical heart valve present   . Pre-diabetes   . Seizures ([place])     Past Surgical History:  Procedure Laterality Date  . CARDIAC SURGERY    . COLON SURGERY    . JOINT REPLACEMENT    . KNEE SURGERY Left      reports that he has been smoking cigarettes.  He has been smoking about 1.00 pack per day. He has never used smokeless tobacco. He reports that he drank alcohol. He reports that he does not use drugs.  No Known Allergies  No family history on file.  No premature coronary artery disease  Prior to Admission medications   Medication Sig Start Date End Date Taking? Authorizing Provider  atorvastatin (LIPITOR) 10 MG tablet Take 1 tablet by  mouth daily. 06/03/17  Yes [provider]  carvedilol (COREG) 3.125 MG tablet Take 1 tablet by mouth 2 (two) times daily. 06/03/17  Yes [provider]  escitalopram (LEXAPRO) 20 MG tablet Take 1 tablet by mouth daily. 05/01/17  Yes [provider]  levETIRAcetam (KEPPRA) 1000 MG tablet Take 1 tablet by mouth 2 (two) times daily. 05/01/17  Yes [provider]  ramipril (ALTACE) 5 MG capsule Take 1 capsule by mouth daily. 07/01/17  Yes [provider]  ranitidine (ZANTAC) 150 MG tablet Take 1 tablet by mouth 2 (two) times daily. 05/01/17  Yes [provider]  tamsulosin (FLOMAX) 0.4 MG CAPS capsule Take 1 capsule by mouth every evening. 05/01/17  Yes [provider]  warfarin (COUMADIN) 5 MG tablet Take 1 tablet by mouth daily. Every Monday, Wednesday and Friday 05/02/17  Yes [provider]  warfarin (COUMADIN) 7.5 MG tablet Take 7.5 mg by mouth daily. Every Sunday, Tuesday, Thursday and Saturday 05/01/17  Yes [provider]    Physical Exam: Vitals:   10/28/17 1443 10/28/17 1634 10/28/17 1911 10/28/17 1915  BP: (!) 141/72 (!) 155/83 (!) 148/74   Pulse: 91 86 (!) 108 97  Resp: 19 19  (!) 22  Temp:      TempSrc:      SpO2: 97% 97% 97% 98%  Weight:      Height:  Constitutional: NAD, calm, comfortable Vitals:   10/28/17 1443 10/28/17 1634 10/28/17 1911 10/28/17 1915  BP: (!) 141/72 (!) 155/83 (!) 148/74   Pulse: 91 86 (!) 108 97  Resp: 19 19  (!) 22  Temp:      TempSrc:      SpO2: 97% 97% 97% 98%  Weight:      Height:       Eyes: PERRL, lids and conjunctivae normal ENMT: Mucous membranes are moist. Posterior pharynx clear of any exudate or lesions.Normal dentition.  Neck: normal, supple, no masses, no thyromegaly Respiratory: clear to auscultation bilaterally, no wheezing, no crackles. Normal respiratory effort. No accessory muscle use.  Cardiovascular: Regular rate and rhythm, no murmurs / rubs /  gallops. No extremity edema. 2+ pedal pulses. No carotid bruits.  Abdomen: no tenderness, no masses palpated. No hepatosplenomegaly. Bowel sounds positive.  Musculoskeletal: no clubbing / cyanosis. No joint deformity upper and lower extremities. Good ROM, no contractures. Normal muscle tone.  Skin: no rashes, lesions, ulcers. No induration Neurologic: CN 2-12 grossly intact. Sensation intact, DTR normal. Strength 5/5 in all 4.  Psychiatric: Normal judgment and insight. Alert and oriented x 3. Normal mood.    Labs on Admission: I have personally reviewed following labs and imaging studies  CBC: Recent Labs  Lab 10/28/17 1313  WBC 9.9  NEUTROABS 8.0*  HGB 13.7  HCT 40.5  MCV 84.0  PLT 169*   Basic Metabolic Panel: Recent Labs  Lab 10/28/17 1313  NA 130*  K 4.2  CL 98  CO2 24  GLUCOSE 395*  BUN 20  CREATININE 1.07  CALCIUM 8.5*   GFR: Estimated Creatinine Clearance: 91.7 mL/min (by C-G formula based on SCr of 1.07 mg/dL). Liver Function Tests: No results for input(s): AST, ALT, ALKPHOS, BILITOT, PROT, ALBUMIN in the last 168 hours. No results for input(s): LIPASE, AMYLASE in the last 168 hours. No results for input(s): AMMONIA in the last 168 hours. Coagulation Profile: Recent Labs  Lab 10/28/17 1313  INR 1.17   Cardiac Enzymes: Recent Labs  Lab 10/28/17 1313 10/28/17 1605  TROPONINI <0.03 0.04*   BNP (last 3 results) No results for input(s): PROBNP in the last 8760 hours. HbA1C: No results for input(s): HGBA1C in the last 72 hours. CBG: Recent Labs  Lab 10/28/17 1636  GLUCAP 263*   Lipid Profile: No results for input(s): CHOL, HDL, LDLCALC, TRIG, CHOLHDL, LDLDIRECT in the last 72 hours. Thyroid Function Tests: No results for input(s): TSH, T4TOTAL, FREET4, T3FREE, THYROIDAB in the last 72 hours. Anemia Panel: No results for input(s): VITAMINB12, FOLATE, FERRITIN, TIBC, IRON, RETICCTPCT in the last 72 hours. Urine analysis:    Component Value  Date/Time   COLORURINE YELLOW 10/28/2017 1530   APPEARANCEUR CLEAR 10/28/2017 1530   LABSPEC 1.031 (H) 10/28/2017 1530   PHURINE 6.0 10/28/2017 1530   GLUCOSEU >=500 (A) 10/28/2017 1530   HGBUR LARGE (A) 10/28/2017 1530   BILIRUBINUR NEGATIVE 10/28/2017 1530   KETONESUR NEGATIVE 10/28/2017 1530   PROTEINUR NEGATIVE 10/28/2017 1530   NITRITE NEGATIVE 10/28/2017 1530   LEUKOCYTESUR NEGATIVE 10/28/2017 1530   Sepsis Labs: !!!!!!!!!!!!!!!!!!!!!!!!!!!!!!!!!!!!!!!!!!!! @LABRCNTIP (procalcitonin:4,lacticidven:4) )No results found for this or any previous visit (from the past 240 hour(s)).   Radiological Exams on Admission: Dg Chest 2 View  Result Date: 10/28/2017 CLINICAL DATA:  Chest pressure EXAM: CHEST - 2 VIEW COMPARISON:  None. FINDINGS: Lungs are clear. The heart size and pulmonary vascularity are normal. No adenopathy. There is aortic atherosclerosis. Patient is status post aortic  valve replacement. There is evidence of old healed trauma involving the lateral left clavicle. IMPRESSION: No edema or consolidation. Aortic atherosclerosis. Status post aortic valve replacement. Aortic Atherosclerosis (ICD10-I70.0). Electronically Signed   By: Lowella Grip III M.D.   On: 10/28/2017 13:33   Ct Head Wo Contrast  Result Date: 10/28/2017 CLINICAL DATA:  Left chest pressure radiating down the left arm EXAM: CT HEAD WITHOUT CONTRAST TECHNIQUE: Contiguous axial images were obtained from the base of the skull through the vertex without intravenous contrast. COMPARISON:  None. FINDINGS: Despite efforts by the technologist and patient, motion artifact is present on today's exam and could not be eliminated. This reduces exam sensitivity and specificity. Brain: Prominent encephalomalacia in the right temporal lobe. This has a chronic appearance. Overlying right frontotemporoparietal craniotomy. Otherwise, the brainstem, cerebellum, cerebral peduncles, thalami, basal ganglia, basilar cisterns, and  ventricular system appear within normal limits. No intracranial hemorrhage, mass lesion, or acute CVA. Vascular: There is atherosclerotic calcification of the cavernous carotid arteries bilaterally. Skull: Right craniotomy as noted above. 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 Other: No supplemental non-categorized findings. IMPRESSION: 1. No acute intracranial findings. 2. 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 3. 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 Electronically Signed   By: Van Clines M.D.   On: 10/28/2017 19:02    EKG: Independently reviewed.  Normal sinus rhythm no acute changes  Old chart reviewed  Case discussed with EDP Dr. Alvino Chapel  Chest x-ray reviewed no edema or infiltrate  Assessment/Plan 68 year old male with atypical chest pain Principal Problem:   Chest pain-serial cardiac enzymes.  Placed on aspirin.  Obtain cardiac echo in the morning.  EKG nonischemic.  Troponin normal.  Chest pain-free at this time.  Active Problems:   Seizures (HCC)-continue Keppra   COPD (chronic obstructive pulmonary disease) (HCC)-stable   Mechanical heart valve present-obtain cardiac echo.  Cover with Lovenox and have pharmacy manage Coumadin.  INR is 1.3   Pre-diabetes-check hemoglobin A1c.  Placed on sliding scale insulin.  May very well be diabetic at this point.    DVT prophylaxis: Lovenox Code Status: Full Family Communication:  none Disposition Plan: Likely tomorrow Consults called: None Admission status: Observation   Malynda Smolinski A MD [facility]  If 7PM-7AM, please contact night-coverage [email or web address] Password [facility]  10/28/2017, 7:43 PM

## 2017-10-28 NOTE — ED Notes (Signed)
Pt will not leave on BP cuff. States, "it hurts my arm."

## 2017-10-28 NOTE — ED Notes (Signed)
EDP at bedside updating patient and family. 

## 2017-10-28 NOTE — ED Notes (Signed)
Pt's daughter arrived and is at bedside.  

## 2017-10-28 NOTE — ED Notes (Signed)
EDP at bedside  

## 2017-10-28 NOTE — ED Notes (Signed)
Pt's daughter states pt is not at his baseline. Reports he appears confused and agitated. EDP notified.

## 2017-10-28 NOTE — ED Notes (Signed)
Patient transported to CT 

## 2017-10-28 NOTE — ED Provider Notes (Signed)
[facility] EMERGENCY DEPARTMENT Provider Note   CSN: [id number] Arrival date & time: 10/28/17  1257     History   Chief Complaint Chief Complaint  Patient presents with  . Chest Pain    HPI Adam Savage is a 67 y.o. male.  HPI Patient presents with left-sided chest pain.  States it was a pressure.  Began around an hour or 2 prior to arrival.  Patient was reportedly clammy.  No shortness of breath dizziness or nausea.  He is visiting from out of town.  States he was playing with his granddaughter who is 65 years old yesterday.  States there is no chest pain with that physical activity.  Had nitroglycerin by EMS and states pain is feeling somewhat better.  Has a history of a heart valve but states he has not had a heart attack in the past.  No cough.  No swelling in his legs.  No fevers.  Has not had pains like this before. Past Medical History:  Diagnosis Date  . COPD (chronic obstructive pulmonary disease) ([place])   . Mechanical heart valve present   . Pre-diabetes   . Seizures ([place])     There are no active problems to display for this patient.   Past Surgical History:  Procedure Laterality Date  . CARDIAC SURGERY    . COLON SURGERY    . JOINT REPLACEMENT    . KNEE SURGERY Left         Home Medications    Prior to Admission medications   Medication Sig Start Date End Date Taking? Authorizing Provider  atorvastatin (LIPITOR) 10 MG tablet Take 1 tablet by mouth daily. 06/03/17  Yes [provider]  carvedilol (COREG) 3.125 MG tablet Take 1 tablet by mouth 2 (two) times daily. 06/03/17  Yes [provider]  escitalopram (LEXAPRO) 20 MG tablet Take 1 tablet by mouth daily. 05/01/17  Yes [provider]  levETIRAcetam (KEPPRA) 1000 MG tablet Take 1 tablet by mouth 2 (two) times daily. 05/01/17  Yes [provider]  ramipril (ALTACE) 5 MG capsule Take 1 capsule by mouth daily. 07/01/17  Yes [provider]  ranitidine (ZANTAC) 150 MG  tablet Take 1 tablet by mouth 2 (two) times daily. 05/01/17  Yes [provider]  tamsulosin (FLOMAX) 0.4 MG CAPS capsule Take 1 capsule by mouth every evening. 05/01/17  Yes [provider]  warfarin (COUMADIN) 5 MG tablet Take 1 tablet by mouth daily. Every Monday, Wednesday and Friday 05/02/17  Yes [provider]  warfarin (COUMADIN) 7.5 MG tablet Take 7.5 mg by mouth daily. Every Sunday, Tuesday, Thursday and Saturday 05/01/17  Yes [provider]    Family History No family history on file.  Social History Social History   Tobacco Use  . Smoking status: Current Every Day Smoker    Packs/day: 1.00    Types: Cigarettes  . Smokeless tobacco: Never Used  Substance Use Topics  . Alcohol use: Not Currently  . Drug use: Never     Allergies   Patient has no known allergies.   Review of Systems Review of Systems  Constitutional: Negative for appetite change and fever.  HENT: Negative for congestion.   Cardiovascular: Positive for chest pain.  Gastrointestinal: Negative for abdominal distention.  Genitourinary: Negative for frequency.  Musculoskeletal: Negative for back pain.  Skin: Negative for rash.  Neurological: Negative for weakness.  Hematological: Negative for adenopathy.  Psychiatric/Behavioral: Negative for confusion.     Physical Exam Updated  Vital Signs BP (!) 155/83   Pulse 86   Temp 97.6 F (36.4 C) (Oral)   Resp 19   Ht 6' (1.829 m)   Wt 125.6 kg (277 lb)   SpO2 97%   BMI 37.57 kg/m   Physical Exam  Constitutional: He appears well-developed.  HENT:  Head: Normocephalic.  Neck: Neck supple.  Cardiovascular:  Systolic murmur.  Pulmonary/Chest: Effort normal.  Abdominal: There is no tenderness.  Musculoskeletal:       Right lower leg: He exhibits no edema.       Left lower leg: He exhibits no edema.  Neurological: He is alert.  Skin: Skin is warm. Capillary refill takes less than 2 seconds.  Psychiatric: He  has a normal mood and affect.     ED Treatments / Results  Labs (all labs ordered are listed, but only abnormal results are displayed) Labs Reviewed  CBC WITH DIFFERENTIAL/PLATELET - Abnormal; Notable for the following components:      Result Value   RDW 16.1 (*)    Platelets 145 (*)    Neutro Abs 8.0 (*)    All other components within normal limits  BASIC METABOLIC PANEL - Abnormal; Notable for the following components:   Sodium 130 (*)    Glucose, Bld 395 (*)    Calcium 8.5 (*)    All other components within normal limits  URINALYSIS, ROUTINE W REFLEX MICROSCOPIC - Abnormal; Notable for the following components:   Specific Gravity, Urine 1.031 (*)    Glucose, UA >=500 (*)    Hgb urine dipstick LARGE (*)    All other components within normal limits  TROPONIN I - Abnormal; Notable for the following components:   Troponin I 0.04 (*)    All other components within normal limits  CBG MONITORING, ED - Abnormal; Notable for the following components:   Glucose-Capillary 263 (*)    All other components within normal limits  TROPONIN I  PROTIME-INR    EKG EKG Interpretation  Date/Time:  Tuesday October 28 2017 13:00:19 EDT Ventricular Rate:  96 PR Interval:    QRS Duration: 82 QT Interval:  352 QTC Calculation: 445 R Axis:   -43 Text Interpretation:  Sinus rhythm Left anterior fascicular block Abnormal R-wave progression, late transition Confirmed by Davonna Belling [phone]) on 10/28/2017 1:06:35 PM   Radiology Dg Chest 2 View  Result Date: 10/28/2017 CLINICAL DATA:  Chest pressure EXAM: CHEST - 2 VIEW COMPARISON:  None. FINDINGS: Lungs are clear. The heart size and pulmonary vascularity are normal. No adenopathy. There is aortic atherosclerosis. Patient is status post aortic valve replacement. There is evidence of old healed trauma involving the lateral left clavicle. IMPRESSION: No edema or consolidation. Aortic atherosclerosis. Status post aortic valve replacement. Aortic  Atherosclerosis (ICD10-I70.0). Electronically Signed   By: Lowella Grip III M.D.   On: 10/28/2017 13:33   Ct Head Wo Contrast  Result Date: 10/28/2017 CLINICAL DATA:  Left chest pressure radiating down the left arm EXAM: CT HEAD WITHOUT CONTRAST TECHNIQUE: Contiguous axial images were obtained from the base of the skull through the vertex without intravenous contrast. COMPARISON:  None. FINDINGS: Despite efforts by the technologist and patient, motion artifact is present on today's exam and could not be eliminated. This reduces exam sensitivity and specificity. Brain: Prominent encephalomalacia in the right temporal lobe. This has a chronic appearance. Overlying right frontotemporoparietal craniotomy. Otherwise, the brainstem, cerebellum, cerebral peduncles, thalami, basal ganglia, basilar cisterns, and ventricular system appear within normal limits. No intracranial  hemorrhage, mass lesion, or acute CVA. Vascular: There is atherosclerotic calcification of the cavernous carotid arteries bilaterally. Skull: Right craniotomy as noted above. 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 Other: No supplemental non-categorized findings. IMPRESSION: 1. No acute intracranial findings. 2. 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 3. 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 Electronically Signed   By: Van Clines M.D.   On: 10/28/2017 19:02    Procedures Procedures (including critical care time)  Medications Ordered in ED Medications  sodium chloride 0.9 % bolus 1,000 mL (0 mLs Intravenous Stopped 10/28/17 1606)     Initial Impression / Assessment and Plan / ED Course  I have reviewed the triage vital signs and the nursing notes.  Pertinent labs & imaging results that were available during my care of the patient were reviewed by me and considered in my medical decision making (see chart  for details).     Patient's daughter is now here to give more information.  Patient reportedly has been confused also.  Patient identified his daughter his wife when asked who she was.  Patient has reportedly had frontal lobectomy previously for seizures.  There is some question of his compliance with his medications.  He is on Keppra.  Reportedly patient's daughter came back from a trip and thinks he has not been taking his medicines.  For the last 2 days she has been helping him with his medications.  His INR is subtherapeutic which likely goes with noncompliance.  No seizure activity has been seen.  Reportedly has had all different kinds of seizures.  Head CT does not show any acute abnormality.  Repeat troponin was 0.04.  EKG reassuring.  Chest pain is feeling somewhat better.  However with the new onset diabetes minimally elevated troponin and confusion I feel the patient would benefit from admission.  Will discuss with hospitalist per  Final Clinical Impressions(s) / ED Diagnoses   Final diagnoses:  Encephalopathy  Hyperglycemia  Chest pain, unspecified type    ED Discharge Orders    None       Davonna Belling, MD 10/28/17 1920

## 2017-10-29 ENCOUNTER — Inpatient Hospital Stay (HOSPITAL_COMMUNITY): Payer: Medicare Other

## 2017-10-29 ENCOUNTER — Other Ambulatory Visit (HOSPITAL_COMMUNITY): Payer: Medicare Other

## 2017-10-29 DIAGNOSIS — G9340 Encephalopathy, unspecified: Secondary | ICD-10-CM

## 2017-10-29 DIAGNOSIS — R079 Chest pain, unspecified: Secondary | ICD-10-CM

## 2017-10-29 DIAGNOSIS — R402250 Coma scale, best verbal response, oriented, unspecified time: Secondary | ICD-10-CM | POA: Diagnosis present

## 2017-10-29 DIAGNOSIS — G4733 Obstructive sleep apnea (adult) (pediatric): Secondary | ICD-10-CM | POA: Diagnosis not present

## 2017-10-29 DIAGNOSIS — I2510 Atherosclerotic heart disease of native coronary artery without angina pectoris: Secondary | ICD-10-CM | POA: Diagnosis not present

## 2017-10-29 DIAGNOSIS — R402360 Coma scale, best motor response, obeys commands, unspecified time: Secondary | ICD-10-CM | POA: Diagnosis present

## 2017-10-29 DIAGNOSIS — R739 Hyperglycemia, unspecified: Secondary | ICD-10-CM

## 2017-10-29 DIAGNOSIS — I25110 Atherosclerotic heart disease of native coronary artery with unstable angina pectoris: Secondary | ICD-10-CM | POA: Diagnosis present

## 2017-10-29 DIAGNOSIS — I214 Non-ST elevation (NSTEMI) myocardial infarction: Principal | ICD-10-CM

## 2017-10-29 DIAGNOSIS — I200 Unstable angina: Secondary | ICD-10-CM | POA: Diagnosis present

## 2017-10-29 DIAGNOSIS — R197 Diarrhea, unspecified: Secondary | ICD-10-CM

## 2017-10-29 DIAGNOSIS — R7303 Prediabetes: Secondary | ICD-10-CM

## 2017-10-29 DIAGNOSIS — N400 Enlarged prostate without lower urinary tract symptoms: Secondary | ICD-10-CM | POA: Diagnosis present

## 2017-10-29 DIAGNOSIS — E1165 Type 2 diabetes mellitus with hyperglycemia: Secondary | ICD-10-CM | POA: Diagnosis not present

## 2017-10-29 DIAGNOSIS — Z7901 Long term (current) use of anticoagulants: Secondary | ICD-10-CM | POA: Diagnosis not present

## 2017-10-29 DIAGNOSIS — E669 Obesity, unspecified: Secondary | ICD-10-CM | POA: Diagnosis present

## 2017-10-29 DIAGNOSIS — G40909 Epilepsy, unspecified, not intractable, without status epilepticus: Secondary | ICD-10-CM | POA: Diagnosis present

## 2017-10-29 DIAGNOSIS — E871 Hypo-osmolality and hyponatremia: Secondary | ICD-10-CM | POA: Diagnosis not present

## 2017-10-29 DIAGNOSIS — Z952 Presence of prosthetic heart valve: Secondary | ICD-10-CM | POA: Diagnosis not present

## 2017-10-29 DIAGNOSIS — I110 Hypertensive heart disease with heart failure: Secondary | ICD-10-CM | POA: Diagnosis not present

## 2017-10-29 DIAGNOSIS — Z23 Encounter for immunization: Secondary | ICD-10-CM | POA: Diagnosis not present

## 2017-10-29 DIAGNOSIS — I472 Ventricular tachycardia: Secondary | ICD-10-CM | POA: Diagnosis not present

## 2017-10-29 DIAGNOSIS — F329 Major depressive disorder, single episode, unspecified: Secondary | ICD-10-CM | POA: Diagnosis present

## 2017-10-29 DIAGNOSIS — I77819 Aortic ectasia, unspecified site: Secondary | ICD-10-CM | POA: Diagnosis present

## 2017-10-29 DIAGNOSIS — R569 Unspecified convulsions: Secondary | ICD-10-CM | POA: Diagnosis not present

## 2017-10-29 DIAGNOSIS — J449 Chronic obstructive pulmonary disease, unspecified: Secondary | ICD-10-CM | POA: Diagnosis not present

## 2017-10-29 DIAGNOSIS — F17210 Nicotine dependence, cigarettes, uncomplicated: Secondary | ICD-10-CM | POA: Diagnosis present

## 2017-10-29 DIAGNOSIS — I444 Left anterior fascicular block: Secondary | ICD-10-CM | POA: Diagnosis not present

## 2017-10-29 DIAGNOSIS — Z6836 Body mass index (BMI) 36.0-36.9, adult: Secondary | ICD-10-CM | POA: Diagnosis not present

## 2017-10-29 DIAGNOSIS — I5032 Chronic diastolic (congestive) heart failure: Secondary | ICD-10-CM | POA: Diagnosis not present

## 2017-10-29 DIAGNOSIS — G9389 Other specified disorders of brain: Secondary | ICD-10-CM | POA: Diagnosis not present

## 2017-10-29 DIAGNOSIS — F419 Anxiety disorder, unspecified: Secondary | ICD-10-CM | POA: Diagnosis present

## 2017-10-29 LAB — BASIC METABOLIC PANEL
Anion gap: 8 (ref 5–15)
BUN: 16 mg/dL (ref 8–23)
CO2: 28 mmol/L (ref 22–32)
Calcium: 8.6 mg/dL — ABNORMAL LOW (ref 8.9–10.3)
Chloride: 100 mmol/L (ref 98–111)
Creatinine, Ser: 1.06 mg/dL (ref 0.61–1.24)
GFR calc Af Amer: >60 mL/min (ref >60)
GFR calc non Af Amer: >60 mL/min (ref >60)
Glucose, Bld: 183 mg/dL — ABNORMAL HIGH (ref 70–99)
Potassium: 4 mmol/L (ref 3.5–5.1)
Sodium: 136 mmol/L (ref 135–145)

## 2017-10-29 LAB — GLUCOSE, CAPILLARY
Glucose-Capillary: 161 mg/dL — ABNORMAL HIGH (ref 70–99)
Glucose-Capillary: 167 mg/dL — ABNORMAL HIGH (ref 70–99)
Glucose-Capillary: 170 mg/dL — ABNORMAL HIGH (ref 70–99)
Glucose-Capillary: 174 mg/dL — ABNORMAL HIGH (ref 70–99)
Glucose-Capillary: 206 mg/dL — ABNORMAL HIGH (ref 70–99)
Glucose-Capillary: 129 mg/dL — ABNORMAL HIGH (ref 70–99)

## 2017-10-29 LAB — PROTIME-INR
INR: 1.47
Prothrombin Time: 17.7 seconds — ABNORMAL HIGH (ref 11.4–15.2)

## 2017-10-29 LAB — CBC
HCT: 38.7 % — ABNORMAL LOW (ref 39.0–52.0)
Hemoglobin: 12.9 g/dL — ABNORMAL LOW (ref 13.0–17.0)
MCH: 27.9 pg (ref 26.0–34.0)
MCHC: 33.3 g/dL (ref 30.0–36.0)
MCV: 83.6 fL (ref 78.0–100.0)
Platelets: 138 10*3/uL — ABNORMAL LOW (ref 150–400)
RBC: 4.63 MIL/uL (ref 4.22–5.81)
RDW: 15.8 % — ABNORMAL HIGH (ref 11.5–15.5)
WBC: 10.4 10*3/uL (ref 4.0–10.5)

## 2017-10-29 LAB — HEMOGLOBIN A1C
Hgb A1c MFr Bld: 8.6 % — ABNORMAL HIGH (ref 4.8–5.6)
Mean Plasma Glucose: 200.12 mg/dL

## 2017-10-29 LAB — ECHOCARDIOGRAM COMPLETE
Height: 72 in
Weight: 4278.69 oz

## 2017-10-29 LAB — HEPARIN LEVEL (UNFRACTIONATED): Heparin Unfractionated: 0.46 IU/mL (ref 0.30–0.70)

## 2017-10-29 LAB — MRSA PCR SCREENING: MRSA by PCR: NEGATIVE

## 2017-10-29 LAB — MAGNESIUM: Magnesium: 2 mg/dL (ref 1.7–2.4)

## 2017-10-29 LAB — TROPONIN I
Troponin I: 1.79 ng/mL (ref <0.03)
Troponin I: 3.33 ng/mL (ref <0.03)

## 2017-10-29 LAB — C DIFFICILE QUICK SCREEN W PCR REFLEX
C Diff antigen: NEGATIVE
C Diff interpretation: NOT DETECTED
C Diff toxin: NEGATIVE

## 2017-10-29 MED ORDER — ENOXAPARIN 120 MG/0.8 ML SUBCUTANEOUS SYRINGE
120.00 mg | INJECTION | SUBCUTANEOUS | Status: DC
Start: 2017-10-29 — End: 2017-10-29

## 2017-10-29 MED ORDER — RAMIPRIL 5 MG CAPSULE
5.00 mg | ORAL_CAPSULE | ORAL | Status: DC
Start: 2017-10-29 — End: 2017-10-29

## 2017-10-29 MED ORDER — INSULIN ASPART U-100  100 UNIT/ML SUBCUTANEOUS SOLUTION
0.00 [IU] | SUBCUTANEOUS | Status: DC
Start: 2017-10-29 — End: 2017-10-29

## 2017-10-29 MED ORDER — PNEUMOCOCCAL 23 POLYVALENT VACCINE 25 MCG/0.5 ML INJECTION SOLUTION
0.50 mL | INTRAMUSCULAR | Status: DC
Start: 2017-10-29 — End: 2017-10-29

## 2017-10-29 MED ORDER — LEVETIRACETAM 500 MG TABLET
1000.00 mg | ORAL_TABLET | ORAL | Status: DC
Start: 2017-10-29 — End: 2017-10-29

## 2017-10-29 MED ORDER — ESCITALOPRAM 10 MG TABLET
20.00 mg | ORAL_TABLET | ORAL | Status: DC
Start: 2017-10-29 — End: 2017-10-29

## 2017-10-29 MED ORDER — CARVEDILOL 3.125 MG TABLET
3.13 mg | ORAL_TABLET | ORAL | Status: DC
Start: 2017-10-29 — End: 2017-10-29

## 2017-10-29 MED ORDER — WARFARIN - PHARMACIST DOSING INPATIENT: Freq: Every day | Status: DC

## 2017-10-29 MED ORDER — GERHARDT'S BUTT CREAM
TOPICAL_CREAM | Freq: Two times a day (BID) | CUTANEOUS | Status: DC
  Administered 2017-10-29 – 2017-11-04 (×7): via TOPICAL
  Filled 2017-10-29 (×2): qty 1

## 2017-10-29 MED ORDER — LEVETIRACETAM IN NACL 1000 MG/100ML IV SOLN: 1000.0000 mg | Freq: Two times a day (BID) | INTRAVENOUS | Status: DC

## 2017-10-29 MED ORDER — HEPARIN (PORCINE) IN NACL 100-0.45 UNIT/ML-% IJ SOLN
1350.0000 [IU]/h | INTRAMUSCULAR | Status: DC
Start: 2017-10-29 — End: 2017-10-31
  Administered 2017-10-29 – 2017-10-31 (×3): 1350 [IU]/h via INTRAVENOUS
  Filled 2017-10-29 (×3): qty 250

## 2017-10-29 MED ORDER — ALUM & MAG HYDROXIDE-SIMETH 200-200-20 MG/5ML PO SUSP
30.0000 mL | Freq: Four times a day (QID) | ORAL | Status: DC | PRN
Start: 2017-10-29 — End: 2017-11-04
  Administered 2017-10-29 – 2017-11-04 (×6): 30 mL via ORAL
  Filled 2017-10-29 (×6): qty 30

## 2017-10-29 MED ORDER — LEVETIRACETAM 500 MG PO TABS
1000.0000 mg | ORAL_TABLET | Freq: Two times a day (BID) | ORAL | Status: DC
Start: 2017-10-29 — End: 2017-11-04
  Administered 2017-10-29 – 2017-11-04 (×12): 1000 mg via ORAL
  Filled 2017-10-29 (×5): qty 2
  Filled 2017-10-29 (×2): qty 4
  Filled 2017-10-29 (×5): qty 2

## 2017-10-29 MED ORDER — ZOLPIDEM TARTRATE 5 MG PO TABS
5.0000 mg | ORAL_TABLET | Freq: Every evening | ORAL | Status: DC | PRN
  Administered 2017-10-29 – 2017-11-03 (×6): 5 mg via ORAL
  Filled 2017-10-29 (×6): qty 1

## 2017-10-29 NOTE — Progress Notes (Signed)
ANTICOAGULATION CONSULT NOTE - Initial Consult  Pharmacy Consult for Heparin Indication: chest pain/ACS  No Known Allergies  Patient Measurements: Height: 6' (182.9 cm) Weight: 267 lb 6.7 oz (121.3 kg) IBW/kg (Calculated) : 77.6 HEPARIN DW (KG): 104.3  Vital Signs: Temp: 97.6 F (36.4 C) (07/17 0846) Temp Source: Oral (07/17 0846) BP: 130/80 (07/17 1100) Pulse Rate: 80 (07/17 1100)  Labs: Recent Labs    10/28/17 1313  10/28/17 2054 10/28/17 2256 10/29/17 0254  HGB 13.7  --   --   --  12.9*  HCT 40.5  --   --   --  38.7*  PLT 145*  --   --   --  138*  LABPROT 14.8  --   --   --  17.7*  INR 1.17  --   --   --  1.47  CREATININE 1.07  --   --   --   --   TROPONINI <0.03   < > 0.63* 1.79* 3.33*   < > = values in this interval not displayed.    Estimated Creatinine Clearance: 90.1 mL/min (by C-G formula based on SCr of 1.07 mg/dL).   Medical History: Past Medical History:  Diagnosis Date  . COPD (chronic obstructive pulmonary disease) ([place])   . Mechanical heart valve present   . Pre-diabetes   . Seizures ([facility])     Medications:  Scheduled:  . atorvastatin  10 mg Oral q1800  . carvedilol  3.125 mg Oral BID WC  . escitalopram  20 mg Oral Daily  . Gerhardt's butt cream   Topical BID  . insulin aspart  0-5 Units Subcutaneous QHS  . insulin aspart  0-9 Units Subcutaneous TID WC  . pneumococcal 23 valent vaccine  0.5 mL Intramuscular Tomorrow-1000  . ramipril  5 mg Oral Daily  . tamsulosin  0.4 mg Oral QPM    Assessment: 67 yo male with NSTEMI and positive troponin (0.63>1.79>3.33) prompting transfer from [facility] to [facility] Of note, patient also has mechanical aortic heart valve on warfarin 7.5 mg daily except 5 mg MWF, INR goal 2-3 per last office visit 5/21 at [facility]. Patient received enoxaparin 120 mg [place] at 0031 and warfarin 2.5 mg.   Pharmacy consulted to start heparin in case of further cardiac evaluation. Baseline H/H at lower end of normal,  platelets low at 138. No signs of bleeding.   Goal of Therapy:  Heparin level 0.3-0.7 units/ml Monitor platelets by anticoagulation protocol: Yes   Plan:  Continue to hold warfarin Start heparin infusion at 1350 units/hr Check anti-Xa level in 6 hours and daily while on heparin Continue to monitor H&H and platelets   Claiborne Billings, PharmD PGY2 Cardiology Pharmacy Resident Phone [phone] 10/29/2017 11:27 AM

## 2017-10-29 NOTE — Progress Notes (Signed)
RN report called to [place] RN at [facility]. Questions answered. RN verbalized understanding.

## 2017-10-29 NOTE — Progress Notes (Addendum)
CRITICAL VALUE ALERT  Critical Value:  Troponin 1.79. Patient denies chest pain. VS per flow sheet. No distress noted.  Date & Time Notied:  10/29/17 0253  Provider Notified: Olevia Bowens, MD 10/29/17 0256  Orders Received/Actions taken: EKG done. Olevia Bowens, MD on unit to assess patient.

## 2017-10-29 NOTE — Progress Notes (Signed)
ANTICOAGULATION CONSULT NOTE - Follow Up Consult  Pharmacy Consult for Heparin (Coumadin on hold) Indication: chest pain/ACS;  mechanical aortic valve (2002)  No Known Allergies  Patient Measurements: Height: 6' (182.9 cm) Weight: 267 lb 6.7 oz (121.3 kg) IBW/kg (Calculated) : 77.6 Heparin Dosing Weight: 104 kg  Vital Signs: Temp: 98.1 F (36.7 C) (07/17 1600) Temp Source: Oral (07/17 1600) BP: 138/61 (07/17 1822) Pulse Rate: 76 (07/17 1822)  Labs: Recent Labs    10/28/17 1313  10/28/17 2054 10/28/17 2256 10/29/17 0254 10/29/17 1714  HGB 13.7  --   --   --  12.9*  --   HCT 40.5  --   --   --  38.7*  --   PLT 145*  --   --   --  138*  --   LABPROT 14.8  --   --   --  17.7*  --   INR 1.17  --   --   --  1.47  --   HEPARINUNFRC  --   --   --   --   --  0.46  CREATININE 1.07  --   --   --   --  1.06  TROPONINI <0.03   < > 0.63* 1.79* 3.33*  --    < > = values in this interval not displayed.    Estimated Creatinine Clearance: 91 mL/min (by C-G formula based on SCr of 1.06 mg/dL).  Assessment:  67 yo male with NSTEMI and positive troponin (0.63>1.79>3.33) prompting transfer from [facility] to [facility]. Of note, patient also has mechanical aortic heart valve on warfarin 7.5 mg daily except 5 mg MWF, INR goal 2-3 per last office visit 5/21 at [facility]. Patient received enoxaparin 120 mg [place] at 0031 and warfarin 2.5 mg.   Pharmacy consulted to start heparin in case of further cardiac evaluation. Baseline H/H at lower end of normal, platelets low at 138. No signs of bleeding. INR 1.47.   Initial heparin level is therapeutic (0.46) on 1350 units/hr.   Goal of Therapy:  Heparin level 0.3-0.7 units/ml Monitor platelets by anticoagulation protocol: Yes   Plan:   Continue heparin drip at 1350 units/hr.  Daily heparin level, PT/INR and CBC.  Follow up plans.  Coumadin on hold.  Arty Baumgartner, [place] Pager: [phone] or phone: [phone] 10/29/2017,7:29 PM

## 2017-10-29 NOTE — Progress Notes (Signed)
Echocardiogram 2D Echocardiogram has been performed.  Adam Savage 10/29/2017, 3:20 PM

## 2017-10-29 NOTE — Consult Note (Addendum)
Cardiology Consult    Patient ID: Adam Savage MRN: [id number], DOB/AGE: [birth year]   Admit date: 10/28/2017 Date of Consult: 10/29/2017  Primary Physician: Patient, [name] Primary Cardiologist: Dr. Regan Rakers Requesting Provider: Dr. Olevia Bowens Reason for Consultation: + trop  Adam Savage is a 67 y.o. male who is being seen today for the evaluation of + troponin and chest pain at the request of Dr. Olevia Bowens.   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   Past Medical History   Past Medical History:  Diagnosis Date  . COPD (chronic obstructive pulmonary disease) ([place])   . Mechanical heart valve present   . Pre-diabetes   . Seizures ([place])     Past Surgical History:  Procedure Laterality Date  . CARDIAC SURGERY    . COLON SURGERY    . JOINT REPLACEMENT    . 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 He is followed by Dr. Regan Rakers back in [place] where he currently lives.  According to notes found in care everywhere he underwent aortic valve replacement in 2002 with a Saint Jude mechanical aortic valve.  Echocardiogram from 06/25/2017 showed valve was well-seated with mild left ear and trace AI.  Notes indicate he had an NSTEMI in 2015 during hospitalization, and underwent a nuclear stress test which showed no regional wall motion abnormalities and was considered a low risk study.  Also noted to have a history of dilated aorta with no AAA noted on last echocardiogram.  He was last seen his cardiologist office on 3/19 and noted being very fatigued his appointment.  He was  continued on his current medication regimen which included Coumadin for his mechanical valve and the importance of his CPAP compliance with his OSA treatment was reiterated.  He is currently here visiting his family.  RN notes that his wife has been out of town caring for her aging father, and the patient has been staying here in [place] with his daughter and grandkids.  Wife did express concern that the patient has been declining over the past several months. In talking with the patient he reports an episode of left-sided chest pain with radiation down to his left arm yesterday morning.  States this episode was brief and resolved spontaneously.  No associated shortness of breath, nausea, vomiting or diaphoresis.  He told his daughter of this episode and she brought [facility] for further evaluation.   In the ED his labs showed a sodium of 130, creatinine of 1.07, hemoglobin 13.7 initial troponin negative, INR was 1.17, Hgb 13.7.  Chest x-ray showed no edema or consolidation. EKG showed SR with no acute ST/T wave abnormalities. He was admitted to Internal Medicine for further work up.  His subsequent troponin trend was 0.04>> 0.63>> 1.79>> 3.33.  Subsequent EKG obtained showed sinus rhythm with new T wave inversion inferior lateral leads.  Cardiology was consulted and recommended transfer to called for further work-up.  Of note he has not had any further episodes of chest pain since the one prior  to admission yesterday morning.  Inpatient Medications    . atorvastatin  10 mg Oral q1800  . carvedilol  3.125 mg Oral BID WC  . escitalopram  20 mg Oral Daily  . insulin aspart  0-5 Units Subcutaneous QHS  . insulin aspart  0-9 Units Subcutaneous TID WC  . levETIRAcetam  1,000 mg Oral BID  . pneumococcal 23 valent vaccine  0.5 mL Intramuscular Tomorrow-1000  . ramipril  5 mg Oral Daily  . tamsulosin  0.4 mg Oral QPM  . Warfarin - Pharmacist Dosing Inpatient   Does not apply q1800     Family History    No family history on file.  Social History    Social History   Socioeconomic History  . Marital status: Married    Spouse name: Not on file  . Number of children: Not on file  . Years of education: Not on file  . Highest education level: Not on file  Occupational History  . Not on file  Social Needs  . Financial resource strain: Not on file  . Food insecurity:    Worry: Not on file    Inability: Not on file  . Transportation needs:    Medical: Not on file    Non-medical: Not on file  Tobacco Use  . Smoking status: Current Every Day Smoker    Packs/day: 1.00    Types: Cigarettes  . Smokeless tobacco: Never Used  Substance and Sexual Activity  . Alcohol use: Not Currently  . Drug use: Never  . Sexual activity: Not on file  Lifestyle  . Physical activity:    Days per week: Not on file    Minutes per session: Not on file  . Stress: Not on file  Relationships  . Social connections:    Talks on phone: Not on file    Gets together: Not on file    Attends religious service: Not on file    Active member of club or organization: Not on file    Attends meetings of clubs or organizations: Not on file    Relationship status: Not on file  . Intimate partner violence:    Fear of current or ex partner: Not on file    Emotionally abused: Not on file    Physically abused: Not on file    Forced sexual activity: Not on file  Other Topics Concern  . Not on file  Social History Narrative  . Not on file     Review of Systems    See HPI  All other systems reviewed and are otherwise negative except as noted above.  Physical Exam    Blood pressure 130/80, pulse 80, temperature 97.6 F (36.4 C), temperature source Oral, resp. rate (!) 21, height 6' (1.829 m), weight 267 lb 6.7 oz (121.3 kg), SpO2 96 %.  General: Pleasant, obese older WM, NAD Psych: Normal affect. Neuro: Alert and oriented X 3. Moves all extremities spontaneously. HEENT:  Normal  Neck: Supple, no JVD. Lungs:  Resp regular and unlabored, CTA. Heart: RRR, soft systolic murmur, crisp mechanical valve click. Abdomen: Soft, non-tender, non-distended, BS + x 4.  Extremities: No clubbing, cyanosis or edema. DP/PT/Radials 2+ and equal bilaterally.  Labs    Troponin (Point of Care Test) No results for input(s): TROPIPOC in the last 72 hours. Recent Labs    10/28/17 1605 10/28/17 2054 10/28/17 2256 10/29/17 0254  TROPONINI 0.04* 0.63* 1.79* 3.33*   Lab Results  Component Value Date   WBC 10.4  10/29/2017   HGB 12.9 (L) 10/29/2017   HCT 38.7 (L) 10/29/2017   MCV 83.6 10/29/2017   PLT 138 (L) 10/29/2017    Recent Labs  Lab 10/28/17 1313  NA 130*  K 4.2  CL 98  CO2 24  BUN 20  CREATININE 1.07  CALCIUM 8.5*  GLUCOSE 395*   No results found for: CHOL, HDL, LDLCALC, TRIG No results found for: [facility]   Radiology Studies    Dg Chest 2 View  Result Date: 10/28/2017 CLINICAL DATA:  Chest pressure EXAM: CHEST - 2 VIEW COMPARISON:  None. FINDINGS: Lungs are clear. The heart size and pulmonary vascularity are normal. No adenopathy. There is aortic atherosclerosis. Patient is status post aortic valve replacement. There is evidence of old healed trauma involving the lateral left clavicle. IMPRESSION: No edema or consolidation. Aortic atherosclerosis. Status post aortic valve replacement. Aortic Atherosclerosis (ICD10-I70.0). Electronically Signed   By: Lowella Grip III M.D.   On: 10/28/2017 13:33   Ct Head Wo Contrast  Result Date: 10/28/2017 CLINICAL DATA:  Left chest pressure radiating down the left arm EXAM: CT HEAD WITHOUT CONTRAST TECHNIQUE: Contiguous axial images were obtained from the base of the skull through the vertex without intravenous contrast. COMPARISON:  None. FINDINGS: Despite efforts by the technologist and patient, motion artifact is present on today's exam and could not be eliminated. This reduces exam sensitivity and specificity.  Brain: Prominent encephalomalacia in the right temporal lobe. This has a chronic appearance. Overlying right frontotemporoparietal craniotomy. Otherwise, the brainstem, cerebellum, cerebral peduncles, thalami, basal ganglia, basilar cisterns, and ventricular system appear within normal limits. No intracranial hemorrhage, mass lesion, or acute CVA. Vascular: There is atherosclerotic calcification of the cavernous carotid arteries bilaterally. Skull: Right craniotomy as noted above. 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 Other: No supplemental non-categorized findings. IMPRESSION: 1. No acute intracranial findings. 2. 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 3. 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 Electronically Signed   By: Van Clines M.D.   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   1. NSTEMI: Presented to [facility] with chest pain. Trop trended up with last reading at 3.3. EKG this morning with new TWI in anterolateral leads. Patient is somewhat difficult historian. Suppose to be on coumadin for his mechanical valve but INR 1.1 on admission. He denies having had a cath in the past, but stress in 2015 was low risk. Suspect he will need ischemic evaluation, though he has had several episodes of diarrhea since arriving this morning. -- hold coumadin, received a dose last evening at [facility]. INR 1.4 today. Start IV heparin -- echo pending  2. Valvular disease s/p mechanical AVR: followed by his primary cardiologist. Suppose to be on coumadin at home, but INR was  subtherapeutic. Echo reported in cardiologist notes stable valve from 07/01/17.  -- IV heparin for now pending plans for invasive procedure.   3. Seizure disorder s/p lobectomy: per primary  4. Diarrhea: Patient reports no hx of the same. RN spoke with wife who reports this has been an ongoing issue  for weeks. Consider checking C diff.   5. Hyponatremia: Na +130 on admission. Recheck BMET this morning.   Barnet Pall, NP-C Pager [phone] 10/29/2017, 11:05 AM   I have examined the patient and reviewed assessment and plan and discussed with patient.  Agree with above as stated.  I had a long discussion with the patient's daughter.  He has had a difficult time now for several months.  The patient's wife had to go stay with her parents to take care of them in their aging years.  The husband was left alone.  The daughter visited and realized quickly that the patient was unable to care for himself.  He has issues with compliance with medications.  He has issues with hygiene.  He has not been using CPAP.  He has irregular sleep patterns per the daughter.  She is also concerned about his memory, and thinks the problems may be related to his prior brain surgery.  She thinks he show signs of dementia.    The daughter brought her father to live with her in [place], from [place].  She has had a difficult time, particularly in the last few days.  She will certainly need some assistance from case managers.  The patient may need placement as it appears he requires extensive supervision.  From a medical standpoint, cardiac cath would be the next step to evaluate his heart.  He is not actively having chest discomfort.  He is having frequent diarrhea, which apparently has been going on for weeks according to the daughter.  IV heparin given his mechanical aortic valve.  He has a crisp S2 click.  The daughter is unaware of any coronary artery disease that was bypassed at the time of his heart  surgery.  She will try to get [place] records from her mother.  Surgery was done in [place].  Will follow and schedule cath when he is more stable.   Larae Grooms

## 2017-10-29 NOTE — Progress Notes (Signed)
PROGRESS NOTE  Lizandro Spellman INO:[id number] DOB: 04/30/1950 DOA: 10/28/2017 PCP: Patient, [name]  HPI/Recap of past 24 hours: Matthew Cina is a 67 y.o. male with medical history significant of aortic valve replacement around 2004 on Coumadin, seizure disorder, status post partial lobectomy secondary to his seizures, prediabetes, COPD is here out of town visiting his family and grandkids when he started complaining of substernal chest pain earlier today that lasted less than 2 hours.  Patient reports pain radiated down his left arm. Pain relieved by nitroglycerin.  No associated shortness of breath with diaphoretic.  Does not have history of heartburn.  No fevers no cough no shortness of breath.  Pain is completely resolved now.  Admitted for chest pain rule out ACS.  Troponin have been trending up and peaked at 3.33.  10/29/2017: Patient seen and examined at his bedside.  He arrived at [facility] from [facility] this am.  Denies chest pain, palpitations, or dyspnea.  Reports watery stools that started since he arrived in the hospital.  Cardiology has been consulted.      Assessment/Plan: Principal Problem:   Chest pain Active Problems:   Seizures ([facility])   COPD (chronic obstructive pulmonary disease) ([facility])   Mechanical heart valve present   Pre-diabetes   Unstable angina ([facility])   NSTEMI Twelve-lead EKG done on 10/29/2017 personally reviewed revealed no specific ST-T changes. Troponin peaked at 3.33 Cardiology consulted Continue heparin drip Continue to monitor vital signs closely NPO until Cardiology sees  Aortic valve replacement On Coumadin at home Hold Coumadin INR was subtherapeutic Currently on heparin drip  Seizure disorder Continue antiseizure medication Keppra IV when n.p.o.  COPD Not in exacerbation Continue COPD medications Continue O2 supplementation to maintain O2 saturation between 88 and 92%  Euvolemic hyponatremia Sodium 130 Repeat BMP today  Chronic  depression/anxiety Continue Lexapro when no longer n.p.o.  BPH Continue Flomax when no longer n.p.o.  Diarrhea Acute onset today per patient If persistent may consider obtaining C. difficile PCR      Code Status: Full code  Family Communication: None at bedside  Disposition Plan: Home in 1 to 2 days when cardiology signs of   Consultants:  Cardiology  Procedures:  None  Antimicrobials:  None  DVT prophylaxis: Heparin drip   Objective: Vitals:   10/29/17 0747 10/29/17 0846 10/29/17 0900 10/29/17 1000  BP: (!) 153/69 123/82 121/63 (!) 110/54  Pulse: 85 69 90 65  Resp:  17 (!) 23 18  Temp:  97.6 F (36.4 C)    TempSrc:  Oral    SpO2: 97% 95% 95% 96%  Weight:  121.3 kg (267 lb 6.7 oz)    Height:  6' (1.829 m)      Intake/Output Summary (Last 24 hours) at 10/29/2017 1048 Last data filed at 10/29/2017 0962 Gross per 24 hour  Intake 1360 ml  Output 325 ml  Net 1035 ml   Filed Weights   10/28/17 1300 10/28/17 2015 10/29/17 0846  Weight: 125.6 kg (277 lb) 122.6 kg (270 lb 3.2 oz) 121.3 kg (267 lb 6.7 oz)    Exam:  . General: 67 y.o. year-old male well developed well nourished in no acute distress.  Alert and oriented x3. . Cardiovascular: Regular rate and rhythm with no rubs or gallops.  No thyromegaly or JVD noted.   Marland Kitchen Respiratory: Clear to auscultation with no wheezes or rales. Good inspiratory effort. . Abdomen: Soft nontender nondistended with normal bowel sounds x4 quadrants. . Musculoskeletal: No lower  extremity edema. 2/4 pulses in all 4 extremities. . Skin: No ulcerative lesions noted or rashes . Psychiatry: Mood is appropriate for condition and setting   Data Reviewed: CBC: Recent Labs  Lab 10/28/17 1313 10/29/17 0254  WBC 9.9 10.4  NEUTROABS 8.0*  --   HGB 13.7 12.9*  HCT 40.5 38.7*  MCV 84.0 83.6  PLT 145* 664*   Basic Metabolic Panel: Recent Labs  Lab 10/28/17 1313  NA 130*  K 4.2  CL 98  CO2 24  GLUCOSE 395*  BUN 20    CREATININE 1.07  CALCIUM 8.5*   GFR: Estimated Creatinine Clearance: 90.1 mL/min (by C-G formula based on SCr of 1.07 mg/dL). Liver Function Tests: No results for input(s): AST, ALT, ALKPHOS, BILITOT, PROT, ALBUMIN in the last 168 hours. No results for input(s): LIPASE, AMYLASE in the last 168 hours. No results for input(s): AMMONIA in the last 168 hours. Coagulation Profile: Recent Labs  Lab 10/28/17 1313 10/29/17 0254  INR 1.17 1.47   Cardiac Enzymes: Recent Labs  Lab 10/28/17 1313 10/28/17 1605 10/28/17 2054 10/28/17 2256 10/29/17 0254  TROPONINI <0.03 0.04* 0.63* 1.79* 3.33*   BNP (last 3 results) No results for input(s): PROBNP in the last 8760 hours. HbA1C: No results for input(s): HGBA1C in the last 72 hours. CBG: Recent Labs  Lab 10/28/17 1636 10/28/17 2055 10/29/17 0427 10/29/17 0738 10/29/17 0906  GLUCAP 263* 139* 161* 167* 170*   Lipid Profile: No results for input(s): CHOL, HDL, LDLCALC, TRIG, CHOLHDL, LDLDIRECT in the last 72 hours. Thyroid Function Tests: No results for input(s): TSH, T4TOTAL, FREET4, T3FREE, THYROIDAB in the last 72 hours. Anemia Panel: No results for input(s): VITAMINB12, FOLATE, FERRITIN, TIBC, IRON, RETICCTPCT in the last 72 hours. Urine analysis:    Component Value Date/Time   COLORURINE YELLOW 10/28/2017 1530   APPEARANCEUR CLEAR 10/28/2017 1530   LABSPEC 1.031 (H) 10/28/2017 1530   PHURINE 6.0 10/28/2017 1530   GLUCOSEU >=500 (A) 10/28/2017 1530   HGBUR LARGE (A) 10/28/2017 1530   BILIRUBINUR NEGATIVE 10/28/2017 1530   KETONESUR NEGATIVE 10/28/2017 1530   PROTEINUR NEGATIVE 10/28/2017 1530   NITRITE NEGATIVE 10/28/2017 1530   LEUKOCYTESUR NEGATIVE 10/28/2017 1530   Sepsis Labs: @LABRCNTIP (procalcitonin:4,lacticidven:4)  )No results found for this or any previous visit (from the past 240 hour(s)).    Studies: Dg Chest 2 View  Result Date: 10/28/2017 CLINICAL DATA:  Chest pressure EXAM: CHEST - 2 VIEW  COMPARISON:  None. FINDINGS: Lungs are clear. The heart size and pulmonary vascularity are normal. No adenopathy. There is aortic atherosclerosis. Patient is status post aortic valve replacement. There is evidence of old healed trauma involving the lateral left clavicle. IMPRESSION: No edema or consolidation. Aortic atherosclerosis. Status post aortic valve replacement. Aortic Atherosclerosis (ICD10-I70.0). Electronically Signed   By: Lowella Grip III M.D.   On: 10/28/2017 13:33   Ct Head Wo Contrast  Result Date: 10/28/2017 CLINICAL DATA:  Left chest pressure radiating down the left arm EXAM: CT HEAD WITHOUT CONTRAST TECHNIQUE: Contiguous axial images were obtained from the base of the skull through the vertex without intravenous contrast. COMPARISON:  None. FINDINGS: Despite efforts by the technologist and patient, motion artifact is present on today's exam and could not be eliminated. This reduces exam sensitivity and specificity. Brain: Prominent encephalomalacia in the right temporal lobe. This has a chronic appearance. Overlying right frontotemporoparietal craniotomy. Otherwise, the brainstem, cerebellum, cerebral peduncles, thalami, basal ganglia, basilar cisterns, and ventricular system appear within normal limits. No intracranial hemorrhage, mass  lesion, or acute CVA. Vascular: There is atherosclerotic calcification of the cavernous carotid arteries bilaterally. Skull: Right craniotomy as noted above. 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 Other: No supplemental non-categorized findings. IMPRESSION: 1. No acute intracranial findings. 2. 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 3. 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 Electronically Signed   By: Van Clines M.D.   On: 10/28/2017 19:02    Scheduled Meds: . atorvastatin  10 mg Oral q1800  . carvedilol  3.125 mg Oral BID  WC  . escitalopram  20 mg Oral Daily  . insulin aspart  0-5 Units Subcutaneous QHS  . insulin aspart  0-9 Units Subcutaneous TID WC  . levETIRAcetam  1,000 mg Oral BID  . pneumococcal 23 valent vaccine  0.5 mL Intramuscular Tomorrow-1000  . ramipril  5 mg Oral Daily  . tamsulosin  0.4 mg Oral QPM  . Warfarin - Pharmacist Dosing Inpatient   Does not apply q1800    Continuous Infusions:   LOS: 0 days     Kayleen Memos, MD [facility] Pager [phone]  If 7PM-7AM, please contact night-coverage [email or web address] Password [facility] 10/29/2017, 10:48 AM

## 2017-10-29 NOTE — Progress Notes (Signed)
[facility] called for transport of patient to [facility]. [facility] given information requested. [facility] plans to arrive after shift change d/t being occupied with critical calls at this time. Judie Petit, Agricultural consultant notified. Olevia Bowens, MD notified.

## 2017-10-29 NOTE — Progress Notes (Addendum)
Night shift telemetry coverage note.  The patient was seen due to an elevated troponin and dynamic EKG changes.  His chart was reviewed.  Chest pain free at this time.  Most recent vital signs temperature 97.9 F, pulse 73, respirations 20, blood pressure 126/57 mmHg and O2 sat 98% on room air.  General NAD HEENT normocephalic, oral mucosa moist. Neck supple, no JVD. Lungs CTA. CV S1, S2, no murmurs. Abdomen obese, soft, nontender. Extremities no edema. Neuro grossly nonfocal. Psych awake, alert, oriented x4.  EKG #1 Vent. rate 96 BPM PR interval * ms QRS duration 82 ms QT/QTc 352/445 ms P-R-T axes 45 -43 65 Sinus rhythm Left anterior fascicular block Abnormal R-wave progression, late transition  EKG #2 Vent. rate 83 BPM PR interval 170 ms QRS duration 72 ms QT/QTc 398/467 ms P-R-T axes 73 -58 79 Normal sinus rhythm with sinus arrhythmia Left axis deviation Septal infarct , age undetermined Abnormal ECG When compared to EKG #1 there is T wave inversion in anterolateral leads.   Assessment:  NSTEMI Dynamic changes on EKG. His troponin level has been a steadily rising from 0.04, then 0.63, then 1.79 and 3.33 ng/mL.   Keep NPO. Has been accepted by cardiology service (Dr. Radford Pax). Switch anticoagulation to heparin.  Mechanical heart Valve. Diabetes (uncontrolled) History of seizures. COPD Continue current treatment.  Tennis Must, MD.  Over 30 minutes of critical care time were used during the process of this emergent medical event.  This document was prepared using Dragon voice recognition software and may contain some unintended transcription errors.

## 2017-10-29 NOTE — Progress Notes (Signed)
[facility] on unit to transfer patient to [facility]. Patient without complaint at this time.

## 2017-10-29 NOTE — Progress Notes (Signed)
Daughter, [name], notified of pending transfer to [facility]. Daughter verbalizes understanding and will call a little later for update on transfer status.

## 2017-10-29 NOTE — Progress Notes (Signed)
[name], daughter, notified that [facility] here to get patient. Notified of patient going to unit 2H at [facility]. Daughter verbalized understanding.

## 2017-10-29 NOTE — Progress Notes (Signed)
CRITICAL VALUE ALERT  Critical Value:  3.3  Date & Time Notied:  10/29/17 0330  Provider Notified: Olevia Bowens, MD via face-to-face 10/29/17 0331   Orders Received/Actions taken: Olevia Bowens, MD on unit to assess patient and arranging transportation to [facility].

## 2017-10-30 DIAGNOSIS — I200 Unstable angina: Secondary | ICD-10-CM

## 2017-10-30 LAB — CBC
HCT: 42.3 % (ref 39.0–52.0)
Hemoglobin: 13.6 g/dL (ref 13.0–17.0)
MCH: 27.3 pg (ref 26.0–34.0)
MCHC: 32.2 g/dL (ref 30.0–36.0)
MCV: 84.9 fL (ref 78.0–100.0)
Platelets: 141 10*3/uL — ABNORMAL LOW (ref 150–400)
RBC: 4.98 MIL/uL (ref 4.22–5.81)
RDW: 16 % — ABNORMAL HIGH (ref 11.5–15.5)
WBC: 10 10*3/uL (ref 4.0–10.5)

## 2017-10-30 LAB — GLUCOSE, CAPILLARY
Glucose-Capillary: 146 mg/dL — ABNORMAL HIGH (ref 70–99)
Glucose-Capillary: 148 mg/dL — ABNORMAL HIGH (ref 70–99)
Glucose-Capillary: 153 mg/dL — ABNORMAL HIGH (ref 70–99)
Glucose-Capillary: 144 mg/dL — ABNORMAL HIGH (ref 70–99)
Glucose-Capillary: 160 mg/dL — ABNORMAL HIGH (ref 70–99)

## 2017-10-30 LAB — BASIC METABOLIC PANEL
Anion gap: 6 (ref 5–15)
BUN: 19 mg/dL (ref 8–23)
CO2: 27 mmol/L (ref 22–32)
Calcium: 8.5 mg/dL — ABNORMAL LOW (ref 8.9–10.3)
Chloride: 104 mmol/L (ref 98–111)
Creatinine, Ser: 1.07 mg/dL (ref 0.61–1.24)
GFR calc Af Amer: >60 mL/min (ref >60)
GFR calc non Af Amer: >60 mL/min (ref >60)
Glucose, Bld: 150 mg/dL — ABNORMAL HIGH (ref 70–99)
Potassium: 4 mmol/L (ref 3.5–5.1)
Sodium: 137 mmol/L (ref 135–145)

## 2017-10-30 LAB — PROTIME-INR
INR: 1.65
Prothrombin Time: 19.4 seconds — ABNORMAL HIGH (ref 11.4–15.2)

## 2017-10-30 LAB — HEPARIN LEVEL (UNFRACTIONATED): Heparin Unfractionated: 0.38 IU/mL (ref 0.30–0.70)

## 2017-10-30 MED ORDER — CARVEDILOL 6.25 MG PO TABS
6.2500 mg | ORAL_TABLET | Freq: Two times a day (BID) | ORAL | Status: DC
Start: 2017-10-30 — End: 2017-11-04
  Administered 2017-10-31 – 2017-11-04 (×9): 6.25 mg via ORAL
  Filled 2017-10-30 (×11): qty 1

## 2017-10-30 MED ORDER — SODIUM CHLORIDE 0.9 % IV SOLN
250.0000 mL | INTRAVENOUS | Status: DC | PRN
Start: 2017-10-30 — End: 2017-10-31

## 2017-10-30 MED ORDER — SODIUM CHLORIDE 0.9% FLUSH
3.0000 mL | Freq: Two times a day (BID) | INTRAVENOUS | Status: DC
  Administered 2017-10-30 – 2017-10-31 (×3): 3 mL via INTRAVENOUS

## 2017-10-30 MED ORDER — SODIUM CHLORIDE 0.9% FLUSH: 3.0000 mL | INTRAVENOUS | Status: DC | PRN

## 2017-10-30 MED ORDER — SODIUM CHLORIDE 0.9 % WEIGHT BASED INFUSION
1.0000 mL/kg/h | INTRAVENOUS | Status: DC
  Administered 2017-10-31: 1 mL/kg/h via INTRAVENOUS

## 2017-10-30 MED ORDER — SODIUM CHLORIDE 0.9 % WEIGHT BASED INFUSION
3.0000 mL/kg/h | INTRAVENOUS | Status: DC
  Administered 2017-10-31: 3 mL/kg/h via INTRAVENOUS

## 2017-10-30 MED ORDER — LOPERAMIDE HCL 2 MG PO CAPS
4.0000 mg | ORAL_CAPSULE | ORAL | Status: DC | PRN
  Administered 2017-10-30 – 2017-11-01 (×5): 4 mg via ORAL
  Filled 2017-10-30 (×5): qty 2

## 2017-10-30 NOTE — Progress Notes (Signed)
PROGRESS NOTE  Adam Savage SHF:[id number] DOB: 11-Nov-1950 DOA: 10/28/2017 PCP: Patient, [name]  HPI/Recap of past 24 hours: Adam Savage is a 67 y.o. male with past medical history significant of valvular heart disease status post mechanical valve around 2002, dilated aorta, hypertension, hyperlipidemia, OSA, seizure status post temporal lobectomy, who presented to [facility] emergency department with chest pain. Here from out of town visiting his family and grandkids when he started complaining of substernal chest pain that lasted less than 2 hours.  Patient reports pain radiated down his left arm. Pain relieved by nitroglycerin.  No associated shortness of breath with diaphoretic.  Does not have history of heartburn.  No fevers no cough no shortness of breath.  Pain is completely resolved now.  Admitted for chest pain rule out ACS.  Troponin have been trending up and peaked at 3.33.  Hospital course complicated by watery stools.  C. difficile negative.  Cardiology consulted and following.  Off Coumadin and on heparin drip.  Plan for cardiac cath tomorrow 10/31/2017.   Assessment/Plan: Principal Problem:   Chest pain Active Problems:   Seizures (HCC)   COPD (chronic obstructive pulmonary disease) (HCC)   Mechanical heart valve present   Pre-diabetes   Unstable angina (HCC)   NSTEMI Chest pain has resolved Troponin peaked at 3.33 Cardiology following and plan for cardiac cath on 10/31/2017 N.p.o. after midnight Coumadin on hold.  Continue heparin drip Continue telemetry monitoring  Valvular heart disease status post mechanical valve placement Around 2002 INR was subtherapeutic on admission Continue to hold Coumadin due to planned procedure Continue heparin drip-Pharmacy managing Obtain CBC in the morning  Diarrhea  Unclear of its duration due to inconsistent information from him and from his family C. difficile negative Afebrile with no leukocytosis Denies abdominal  cramping Imodium started as needed  Seizure disorder, stable Continue antiseizure medications N.p.o. after midnight due to planned procedure  COPD, stable Not in exacerbation Continue COPD medications  Chronic diastolic CHF 2D echo done on 10/29/2017 revealed LVEF 55 to 60% with grade 1 diastolic dysfunction no regional wall motion abnormalities.  Euvolemic hyponatremia, resolved  Chronic depression/anxiety Continue Lexapro when no longer n.p.o.  BPH Continue Flomax when no longer n.p.o.   Code Status: Full code  Family Communication: None at bedside  Disposition Plan: Home in 1 to 2 days when cardiology signs of   Consultants:  Cardiology  Procedures:  None  Antimicrobials:  None  DVT prophylaxis: Heparin drip   Objective: Vitals:   10/30/17 1136 10/30/17 1153 10/30/17 1200 10/30/17 1235  BP: 115/71  127/86   Pulse:    78  Resp:      Temp:  98.5 F (36.9 C)    TempSrc:  Oral    SpO2: 95%   97%  Weight:      Height:        Intake/Output Summary (Last 24 hours) at 10/30/2017 1305 Last data filed at 10/30/2017 1100 Gross per 24 hour  Intake 514.77 ml  Output 350 ml  Net 164.77 ml   Filed Weights   10/28/17 2015 10/29/17 0846 10/30/17 0400  Weight: 122.6 kg (270 lb 3.2 oz) 121.3 kg (267 lb 6.7 oz) 119.5 kg (263 lb 7.2 oz)    Exam:  . General: 67 y.o. year-old male well-developed well-nourished in no acute distress.  Alert and oriented x3. . Cardiovascular: Regular rate and rhythm with no rubs or gallops.  No thyromegaly or JVD noted. Marland Kitchen Respiratory: Clear to auscultation with  no wheezes or rales.  Good inspiratory effort. . Abdomen: Soft nontender nondistended with normal bowel sounds x4 quadrants.   . Skin: No ulcerative lesions noted or rashes . Psychiatry: Mood is appropriate for condition and setting   Data Reviewed: CBC: Recent Labs  Lab 10/28/17 1313 10/29/17 0254 10/30/17 0348  WBC 9.9 10.4 10.0  NEUTROABS 8.0*  --   --   HGB  13.7 12.9* 13.6  HCT 40.5 38.7* 42.3  MCV 84.0 83.6 84.9  PLT 145* 138* 700*   Basic Metabolic Panel: Recent Labs  Lab 10/28/17 1313 10/29/17 1714 10/30/17 0348  NA 130* 136 137  K 4.2 4.0 4.0  CL 98 100 104  CO2 24 28 27   GLUCOSE 395* 183* 150*  BUN 20 16 19   CREATININE 1.07 1.06 1.07  CALCIUM 8.5* 8.6* 8.5*  MG  --  2.0  --    GFR: Estimated Creatinine Clearance: 89.4 mL/min (by C-G formula based on SCr of 1.07 mg/dL). Liver Function Tests: No results for input(s): AST, ALT, ALKPHOS, BILITOT, PROT, ALBUMIN in the last 168 hours. No results for input(s): LIPASE, AMYLASE in the last 168 hours. No results for input(s): AMMONIA in the last 168 hours. Coagulation Profile: Recent Labs  Lab 10/28/17 1313 10/29/17 0254 10/30/17 0348  INR 1.17 1.47 1.65   Cardiac Enzymes: Recent Labs  Lab 10/28/17 1313 10/28/17 1605 10/28/17 2054 10/28/17 2256 10/29/17 0254  TROPONINI <0.03 0.04* 0.63* 1.79* 3.33*   BNP (last 3 results) No results for input(s): PROBNP in the last 8760 hours. HbA1C: Recent Labs    10/28/17 1313  HGBA1C 8.6*   CBG: Recent Labs  Lab 10/29/17 1631 10/29/17 2249 10/30/17 0701 10/30/17 0742 10/30/17 1149  GLUCAP 206* 129* 146* 148* 153*   Lipid Profile: No results for input(s): CHOL, HDL, LDLCALC, TRIG, CHOLHDL, LDLDIRECT in the last 72 hours. Thyroid Function Tests: No results for input(s): TSH, T4TOTAL, FREET4, T3FREE, THYROIDAB in the last 72 hours. Anemia Panel: No results for input(s): VITAMINB12, FOLATE, FERRITIN, TIBC, IRON, RETICCTPCT in the last 72 hours. Urine analysis:    Component Value Date/Time   COLORURINE YELLOW 10/28/2017 1530   APPEARANCEUR CLEAR 10/28/2017 1530   LABSPEC 1.031 (H) 10/28/2017 1530   PHURINE 6.0 10/28/2017 1530   GLUCOSEU >=500 (A) 10/28/2017 1530   HGBUR LARGE (A) 10/28/2017 1530   BILIRUBINUR NEGATIVE 10/28/2017 1530   KETONESUR NEGATIVE 10/28/2017 1530   PROTEINUR NEGATIVE 10/28/2017 1530    NITRITE NEGATIVE 10/28/2017 1530   LEUKOCYTESUR NEGATIVE 10/28/2017 1530   Sepsis Labs: @LABRCNTIP (procalcitonin:4,lacticidven:4)  ) Recent Results (from the past 240 hour(s))  MRSA PCR Screening     Status: None   Collection Time: 10/29/17  8:49 AM  Result Value Ref Range Status   MRSA by PCR NEGATIVE NEGATIVE Final    Comment:        The GeneXpert MRSA Assay (FDA approved for NASAL specimens only), is one component of a comprehensive MRSA colonization surveillance program. It is not intended to diagnose MRSA infection nor to guide or monitor treatment for MRSA infections. Performed at [facility], [place] [address] [place], [place] [address]   C difficile quick scan w PCR reflex     Status: None   Collection Time: 10/29/17  5:03 PM  Result Value Ref Range Status   C Diff antigen NEGATIVE NEGATIVE Final   C Diff toxin NEGATIVE NEGATIVE Final   C Diff interpretation No C. difficile detected.  Final    Comment: Performed  at [facility], [place] [address] [place], [place] [address]      Studies: No results found.  Scheduled Meds: . atorvastatin  10 mg Oral q1800  . carvedilol  6.25 mg Oral BID WC  . escitalopram  20 mg Oral Daily  . Gerhardt's butt cream   Topical BID  . insulin aspart  0-5 Units Subcutaneous QHS  . insulin aspart  0-9 Units Subcutaneous TID WC  . levETIRAcetam  1,000 mg Oral BID  . pneumococcal 23 valent vaccine  0.5 mL Intramuscular Tomorrow-1000  . ramipril  5 mg Oral Daily  . sodium chloride flush  3 mL Intravenous Q12H  . tamsulosin  0.4 mg Oral QPM    Continuous Infusions: . sodium chloride    . [START ON 10/31/2017] sodium chloride     Followed by  . [START ON 10/31/2017] sodium chloride    . heparin 1,350 Units/hr (10/30/17 1100)     LOS: 1 day     Kayleen Memos, MD [facility] Pager [phone]  If 7PM-7AM, please contact night-coverage [email or web address] Password [email or web address] 10/30/2017, 1:05 PM

## 2017-10-30 NOTE — Discharge Instructions (Signed)

## 2017-10-30 NOTE — H&P (View-Only) (Signed)
Progress Note  Patient Name: Adam Savage Date of Encounter: 10/30/2017  Primary Cardiologist: No primary care provider on file.  Subjective   Feeling ok this morning. No chest pain. Has had only 2 episodes of diarrhea this morning.   Inpatient Medications    Scheduled Meds: . atorvastatin  10 mg Oral q1800  . carvedilol  3.125 mg Oral BID WC  . escitalopram  20 mg Oral Daily  . Gerhardt's butt cream   Topical BID  . insulin aspart  0-5 Units Subcutaneous QHS  . insulin aspart  0-9 Units Subcutaneous TID WC  . levETIRAcetam  1,000 mg Oral BID  . pneumococcal 23 valent vaccine  0.5 mL Intramuscular Tomorrow-1000  . ramipril  5 mg Oral Daily  . tamsulosin  0.4 mg Oral QPM   Continuous Infusions: . heparin 1,350 Units/hr (10/30/17 0800)   PRN Meds: acetaminophen, alum & mag hydroxide-simeth, loperamide, ondansetron (ZOFRAN) IV, zolpidem   Vital Signs    Vitals:   10/30/17 0500 10/30/17 0600 10/30/17 0700 10/30/17 0808  BP: 127/61 (!) 146/76 139/77   Pulse: 74 70 65   Resp: 17 (!) 21 (!) 21   Temp:    98.8 F (37.1 C)  TempSrc:    Oral  SpO2: 96% 93% 94%   Weight:      Height:        Intake/Output Summary (Last 24 hours) at 10/30/2017 1010 Last data filed at 10/30/2017 0800 Gross per 24 hour  Intake 604.55 ml  Output 700 ml  Net -95.45 ml   Filed Weights   10/28/17 2015 10/29/17 0846 10/30/17 0400  Weight: 270 lb 3.2 oz (122.6 kg) 267 lb 6.7 oz (121.3 kg) 263 lb 7.2 oz (119.5 kg)    Telemetry    SR with several short runs of NSVT - Personally Reviewed  Physical Exam   General: Well developed, well nourished, male appearing in no acute distress. Head: Normocephalic, atraumatic.  Neck: Supple, no JVD. Lungs:  Resp regular and unlabored, CTA. Heart: RRR, S1, S2, crisp s2 click, no rub. Abdomen: Soft, non-tender, non-distended with normoactive bowel sounds.  Extremities: No clubbing, cyanosis, edema. Distal pedal pulses are 2+ bilaterally. Neuro: Alert  and oriented X 3. Moves all extremities spontaneously. Psych: Normal affect.  Labs    Chemistry Recent Labs  Lab 10/28/17 1313 10/29/17 1714 10/30/17 0348  NA 130* 136 137  K 4.2 4.0 4.0  CL 98 100 104  CO2 24 28 27   GLUCOSE 395* 183* 150*  BUN 20 16 19   CREATININE 1.07 1.06 1.07  CALCIUM 8.5* 8.6* 8.5*  GFRNONAA >60 >60 >60  GFRAA >60 >60 >60  ANIONGAP 8 8 6      Hematology Recent Labs  Lab 10/28/17 1313 10/29/17 0254 10/30/17 0348  WBC 9.9 10.4 10.0  RBC 4.82 4.63 4.98  HGB 13.7 12.9* 13.6  HCT 40.5 38.7* 42.3  MCV 84.0 83.6 84.9  MCH 28.4 27.9 27.3  MCHC 33.8 33.3 32.2  RDW 16.1* 15.8* 16.0*  PLT 145* 138* 141*    Cardiac Enzymes Recent Labs  Lab 10/28/17 1605 10/28/17 2054 10/28/17 2256 10/29/17 0254  TROPONINI 0.04* 0.63* 1.79* 3.33*   No results for input(s): TROPIPOC in the last 168 hours.   BNPNo results for input(s): BNP, PROBNP in the last 168 hours.   DDimer No results for input(s): DDIMER in the last 168 hours.    Radiology    Dg Chest 2 View  Result Date: 10/28/2017 CLINICAL DATA:  Chest  pressure EXAM: CHEST - 2 VIEW COMPARISON:  None. FINDINGS: Lungs are clear. The heart size and pulmonary vascularity are normal. No adenopathy. There is aortic atherosclerosis. Patient is status post aortic valve replacement. There is evidence of old healed trauma involving the lateral left clavicle. IMPRESSION: No edema or consolidation. Aortic atherosclerosis. Status post aortic valve replacement. Aortic Atherosclerosis (ICD10-I70.0). Electronically Signed   By: Lowella Grip III M.D.   On: 10/28/2017 13:33   Ct Head Wo Contrast  Result Date: 10/28/2017 CLINICAL DATA:  Left chest pressure radiating down the left arm EXAM: CT HEAD WITHOUT CONTRAST TECHNIQUE: Contiguous axial images were obtained from the base of the skull through the vertex without intravenous contrast. COMPARISON:  None. FINDINGS: Despite efforts by the technologist and patient, motion  artifact is present on today's exam and could not be eliminated. This reduces exam sensitivity and specificity. Brain: Prominent encephalomalacia in the right temporal lobe. This has a chronic appearance. Overlying right frontotemporoparietal craniotomy. Otherwise, the brainstem, cerebellum, cerebral peduncles, thalami, basal ganglia, basilar cisterns, and ventricular system appear within normal limits. No intracranial hemorrhage, mass lesion, or acute CVA. Vascular: There is atherosclerotic calcification of the cavernous carotid arteries bilaterally. Skull: Right craniotomy as noted above. 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 Other: No supplemental non-categorized findings. IMPRESSION: 1. No acute intracranial findings. 2. 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 3. 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 Electronically Signed   By: Van Clines M.D.   On: 10/28/2017 19:02    Cardiac Studies   Cath: 10/29/17  Study Conclusions  - Left ventricle: The cavity size was normal. There was severe   hypertrophy of the basal septum with otherwise moderate   concentric LVH. Systolic function was normal. The estimated   ejection fraction was in the range of 55% to 60%. Wall motion was   normal; there were no regional wall motion abnormalities. Doppler   parameters are consistent with abnormal left ventricular   relaxation (grade 1 diastolic dysfunction). Doppler parameters   are consistent with indeterminate ventricular filling pressure. - Aortic valve: A bioprosthesis was present and functioning   properly. Valve area (VTI): 2.74 cm^2. Valve area (Vmax): 2.72   cm^2. Valve area (Vmean): 2.7 cm^2. - Mitral valve: Transvalvular velocity was within the normal range.   There was no evidence for stenosis. There was no regurgitation. - Left atrium: The atrium was moderately  dilated. - Right ventricle: The cavity size was normal. Wall thickness was   normal. Systolic function was normal. - Tricuspid valve: There was trivial regurgitation. - Pulmonary arteries: Systolic pressure was within the normal   range. PA peak pressure: 27 mm Hg (S).  Patient Profile     67 y.o. 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  Assessment & Plan    1. NSTEMI: Presented to [facility] with chest pain. Trop trended up with last reading at 3.3. EKG with new TWI in anterolateral leads. Patient is somewhat difficult historian. Suppose to be on coumadin for his mechanical valve but INR 1.1 on admission. He denied having had a cath in the past, but stress in 2015 was low risk. Will plan for cardiac cath in the morning as his episodes of loose stools are improving. Echo with normal EF and no WMA. -- has had several runs  of NSVT on telemetry, will further increase his coreg today.  2. Valvular disease s/p mechanical AVR: followed by his primary cardiologist. Suppose to be on coumadin at home, but INR was subtherapeutic on admission. Echo reported in cardiologist notes stable valve from 07/01/17.  -- IV heparin for now given plans for cath.   3. Seizure disorder s/p lobectomy: per primary  4. Diarrhea: Patient reports no hx of the same. RN spoke with wife who reports this has been an ongoing issue for weeks. C diff negative. Has had 2 loose stools this morning.    5. Hyponatremia: Na +130 on admission. Resolved.  Signed, Reino Bellis, NP  10/30/2017, 10:10 AM  Pager # [phone]   I have examined the patient and reviewed assessment and plan and discussed with patient.  Agree with above as stated.  Plan for cath.  He is still quite sleepy, which was also reported by his daughter to me yesterday.  Heparin for mechanical aortic valve.  Crisp S2 click  on exam.  Diarrhea has been a persistent issue as well. Nees to stop smoking and be compliant with his CPAP therapy.   Larae Grooms   For questions or updates, please contact [facility] Please consult [email or web address] for contact info under Cardiology/STEMI.

## 2017-10-30 NOTE — Evaluation (Signed)
Physical Therapy Evaluation Patient Details Name: Adam Savage MRN: [id number] DOB: 1950-05-24 Today's Date: 10/30/2017   History of Present Illness  67 y.o. male admitted for CP, found to have STEMI with elevated troponin. PMH includes: Seizures, COPD, mechanical heart valve L TKA. Plan for cardiac cath for further workup 7/19.     Clinical Impression  Pt admitted with above diagnosis. Pt currently with functional limitations due to the deficits listed below (see PT Problem List). PTA, patient independent with all mobility, driving, no use of assistive device. Upon eval pt presents near baseline, ambulating unit, scored 23/24 on DGI, VSS on RA. Pt became DOE 2/4 with activity. Next PT visit to focus on stairs for home entry, pt wishes to return home with his daughter here in [place], open to OP Cardiac Rehab. Denies increase in CP with activity, no runs of VT on tele. HR max 95, SpO2 WNL on RA  Pt will benefit from skilled PT to increase their independence and safety with mobility to allow discharge to the venue listed below.     108/70 starting BP 119/64 with activity   HR 72-95 this visit  SpO2 WNL on RA   Follow Up Recommendations No PT follow up(OP CARDIAC REHAB )    Equipment Recommendations  None recommended by PT    Recommendations for Other Services       Precautions / Restrictions        Mobility  Bed Mobility Overal bed mobility: Modified Independent                Transfers Overall transfer level: Modified independent Equipment used: None                Ambulation/Gait Ambulation/Gait assistance: Supervision Gait Distance (Feet): 600 Feet Assistive device: None Gait Pattern/deviations: Step-through pattern Gait velocity: slight decrease   General Gait Details: ambulating hallway, performing DGI 23/24. VSS on RA. DOE 2/4  Financial trader Rankin (Stroke Patients Only)       Balance Overall balance  assessment: Mild deficits observed, not formally tested                                           Pertinent Vitals/Pain Pain Assessment: No/denies pain    Home Living Family/patient expects to be discharged to:: Private residence Living Arrangements: Children Available Help at Discharge: Family;Available 24 hours/day Type of Home: House Home Access: Level entry     Home Layout: One level Home Equipment: Walker - 2 wheels      Prior Function Level of Independence: Independent         Comments: pt community ambulator, drives, no AD, no falls, no Home O2     Hand Dominance   Dominant Hand: Right    Extremity/Trunk Assessment   Upper Extremity Assessment Upper Extremity Assessment: Defer to OT evaluation    Lower Extremity Assessment Lower Extremity Assessment: Overall WFL for tasks assessed       Communication   Communication: No difficulties  Cognition Arousal/Alertness: Awake/alert Behavior During Therapy: WFL for tasks assessed/performed Overall Cognitive Status: Within Functional Limits for tasks assessed  General Comments      Exercises     Assessment/Plan    PT Assessment Patient needs continued PT services  PT Problem List Cardiopulmonary status limiting activity;Decreased activity tolerance       PT Treatment Interventions Gait training;Stair training    PT Goals (Current goals can be found in the Care Plan section)  Acute Rehab PT Goals Patient Stated Goal: to live with his daugther PT Goal Formulation: With patient Time For Goal Achievement: 11/06/17 Potential to Achieve Goals: Good    Frequency Min 3X/week   Barriers to discharge        Co-evaluation               AM-PAC PT "6 Clicks" Daily Activity  Outcome Measure Difficulty turning over in bed (including adjusting bedclothes, sheets and blankets)?: None Difficulty moving from lying on back to  sitting on the side of the bed? : None Difficulty sitting down on and standing up from a chair with arms (e.g., wheelchair, bedside commode, etc,.)?: None Help needed moving to and from a bed to chair (including a wheelchair)?: None Help needed walking in hospital room?: A Little Help needed climbing 3-5 steps with a railing? : A Little 6 Click Score: 22    End of Session Equipment Utilized During Treatment: Gait belt Activity Tolerance: Patient tolerated treatment well Patient left: in bed;with call bell/phone within reach Nurse Communication: Mobility status PT Visit Diagnosis: Difficulty in walking, not elsewhere classified (R26.2)    Time: 3893-7342 PT Time Calculation (min) (ACUTE ONLY): 30 min   Charges:   PT Evaluation $PT Eval Low Complexity: 1 Low PT Treatments $Gait Training: 8-22 mins   PT G Codes:        Reinaldo Berber, PT, DPT Acute Rehab Services Pager: [phone]    Reinaldo Berber 10/30/2017, 3:26 PM

## 2017-10-30 NOTE — Progress Notes (Signed)
ANTICOAGULATION CONSULT NOTE - Follow Up Consult  Pharmacy Consult for Heparin (Coumadin on hold) Indication: chest pain/ACS;  mechanical aortic valve (2002)  No Known Allergies  Patient Measurements: Height: 6' (182.9 cm) Weight: 263 lb 7.2 oz (119.5 kg) IBW/kg (Calculated) : 77.6 Heparin Dosing Weight: 104 kg  Vital Signs: Temp: 98.8 F (37.1 C) (07/18 0808) Temp Source: Oral (07/18 0808) BP: 139/77 (07/18 0700) Pulse Rate: 65 (07/18 0700)  Labs: Recent Labs    10/28/17 1313  10/28/17 2054 10/28/17 2256 10/29/17 0254 10/29/17 1714 10/30/17 0348  HGB 13.7  --   --   --  12.9*  --  13.6  HCT 40.5  --   --   --  38.7*  --  42.3  PLT 145*  --   --   --  138*  --  141*  LABPROT 14.8  --   --   --  17.7*  --  19.4*  INR 1.17  --   --   --  1.47  --  1.65  HEPARINUNFRC  --   --   --   --   --  0.46 0.38  CREATININE 1.07  --   --   --   --  1.06 1.07  TROPONINI <0.03   < > 0.63* 1.79* 3.33*  --   --    < > = values in this interval not displayed.    Estimated Creatinine Clearance: 89.4 mL/min (by C-G formula based on SCr of 1.07 mg/dL).  Assessment:  67 yo male with NSTEMI and positive troponin (0.63>1.79>3.33) prompting transfer from [facility] to [facility]. Of note, patient also has mechanical aortic heart valve on warfarin 7.5 mg daily except 5 mg MWF, INR goal 2-3 per last office visit 5/21 at [facility]. Patient received enoxaparin 120 mg [place] at 0031 and warfarin 2.5 mg on 7/17.   Pharmacy consulted to manage heparin in case of further cardiac evaluation. Baseline H/H at lower end of normal, platelets low at 138>141. INR 1.47>1.65.  Heparin level remains therapeutic (0.38) on 1350 units/hr. No infusion issues or signs of bleeding.   Goal of Therapy:  Heparin level 0.3-0.7 units/ml Monitor platelets by anticoagulation protocol: Yes   Plan:   Continue heparin drip at 1350 units/hr.  Daily heparin level, PT/INR and CBC.  Follow up plans.  Coumadin on  hold.    Claiborne Billings, PharmD PGY2 Cardiology Pharmacy Resident Phone [phone] 10/30/2017 8:21 AM

## 2017-10-30 NOTE — Progress Notes (Addendum)
Progress Note  Patient Name: Adam Savage Date of Encounter: 10/30/2017  Primary Cardiologist: No primary care provider on file.  Subjective   Feeling ok this morning. No chest pain. Has had only 2 episodes of diarrhea this morning.   Inpatient Medications    Scheduled Meds: . atorvastatin  10 mg Oral q1800  . carvedilol  3.125 mg Oral BID WC  . escitalopram  20 mg Oral Daily  . Gerhardt's butt cream   Topical BID  . insulin aspart  0-5 Units Subcutaneous QHS  . insulin aspart  0-9 Units Subcutaneous TID WC  . levETIRAcetam  1,000 mg Oral BID  . pneumococcal 23 valent vaccine  0.5 mL Intramuscular Tomorrow-1000  . ramipril  5 mg Oral Daily  . tamsulosin  0.4 mg Oral QPM   Continuous Infusions: . heparin 1,350 Units/hr (10/30/17 0800)   PRN Meds: acetaminophen, alum & mag hydroxide-simeth, loperamide, ondansetron (ZOFRAN) IV, zolpidem   Vital Signs    Vitals:   10/30/17 0500 10/30/17 0600 10/30/17 0700 10/30/17 0808  BP: 127/61 (!) 146/76 139/77   Pulse: 74 70 65   Resp: 17 (!) 21 (!) 21   Temp:    98.8 F (37.1 C)  TempSrc:    Oral  SpO2: 96% 93% 94%   Weight:      Height:        Intake/Output Summary (Last 24 hours) at 10/30/2017 1010 Last data filed at 10/30/2017 0800 Gross per 24 hour  Intake 604.55 ml  Output 700 ml  Net -95.45 ml   Filed Weights   10/28/17 2015 10/29/17 0846 10/30/17 0400  Weight: 270 lb 3.2 oz (122.6 kg) 267 lb 6.7 oz (121.3 kg) 263 lb 7.2 oz (119.5 kg)    Telemetry    SR with several short runs of NSVT - Personally Reviewed  Physical Exam   General: Well developed, well nourished, male appearing in no acute distress. Head: Normocephalic, atraumatic.  Neck: Supple, no JVD. Lungs:  Resp regular and unlabored, CTA. Heart: RRR, S1, S2, crisp s2 click, no rub. Abdomen: Soft, non-tender, non-distended with normoactive bowel sounds.  Extremities: No clubbing, cyanosis, edema. Distal pedal pulses are 2+ bilaterally. Neuro: Alert  and oriented X 3. Moves all extremities spontaneously. Psych: Normal affect.  Labs    Chemistry Recent Labs  Lab 10/28/17 1313 10/29/17 1714 10/30/17 0348  NA 130* 136 137  K 4.2 4.0 4.0  CL 98 100 104  CO2 24 28 27   GLUCOSE 395* 183* 150*  BUN 20 16 19   CREATININE 1.07 1.06 1.07  CALCIUM 8.5* 8.6* 8.5*  GFRNONAA >60 >60 >60  GFRAA >60 >60 >60  ANIONGAP 8 8 6      Hematology Recent Labs  Lab 10/28/17 1313 10/29/17 0254 10/30/17 0348  WBC 9.9 10.4 10.0  RBC 4.82 4.63 4.98  HGB 13.7 12.9* 13.6  HCT 40.5 38.7* 42.3  MCV 84.0 83.6 84.9  MCH 28.4 27.9 27.3  MCHC 33.8 33.3 32.2  RDW 16.1* 15.8* 16.0*  PLT 145* 138* 141*    Cardiac Enzymes Recent Labs  Lab 10/28/17 1605 10/28/17 2054 10/28/17 2256 10/29/17 0254  TROPONINI 0.04* 0.63* 1.79* 3.33*   No results for input(s): TROPIPOC in the last 168 hours.   BNPNo results for input(s): BNP, PROBNP in the last 168 hours.   DDimer No results for input(s): DDIMER in the last 168 hours.    Radiology    Dg Chest 2 View  Result Date: 10/28/2017 CLINICAL DATA:  Chest  pressure EXAM: CHEST - 2 VIEW COMPARISON:  None. FINDINGS: Lungs are clear. The heart size and pulmonary vascularity are normal. No adenopathy. There is aortic atherosclerosis. Patient is status post aortic valve replacement. There is evidence of old healed trauma involving the lateral left clavicle. IMPRESSION: No edema or consolidation. Aortic atherosclerosis. Status post aortic valve replacement. Aortic Atherosclerosis (ICD10-I70.0). Electronically Signed   By: Lowella Grip III M.D.   On: 10/28/2017 13:33   Ct Head Wo Contrast  Result Date: 10/28/2017 CLINICAL DATA:  Left chest pressure radiating down the left arm EXAM: CT HEAD WITHOUT CONTRAST TECHNIQUE: Contiguous axial images were obtained from the base of the skull through the vertex without intravenous contrast. COMPARISON:  None. FINDINGS: Despite efforts by the technologist and patient, motion  artifact is present on today's exam and could not be eliminated. This reduces exam sensitivity and specificity. Brain: Prominent encephalomalacia in the right temporal lobe. This has a chronic appearance. Overlying right frontotemporoparietal craniotomy. Otherwise, the brainstem, cerebellum, cerebral peduncles, thalami, basal ganglia, basilar cisterns, and ventricular system appear within normal limits. No intracranial hemorrhage, mass lesion, or acute CVA. Vascular: There is atherosclerotic calcification of the cavernous carotid arteries bilaterally. Skull: Right craniotomy as noted above. 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 Other: No supplemental non-categorized findings. IMPRESSION: 1. No acute intracranial findings. 2. 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 3. 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 Electronically Signed   By: Van Clines M.D.   On: 10/28/2017 19:02    Cardiac Studies   Cath: 10/29/17  Study Conclusions  - Left ventricle: The cavity size was normal. There was severe   hypertrophy of the basal septum with otherwise moderate   concentric LVH. Systolic function was normal. The estimated   ejection fraction was in the range of 55% to 60%. Wall motion was   normal; there were no regional wall motion abnormalities. Doppler   parameters are consistent with abnormal left ventricular   relaxation (grade 1 diastolic dysfunction). Doppler parameters   are consistent with indeterminate ventricular filling pressure. - Aortic valve: A bioprosthesis was present and functioning   properly. Valve area (VTI): 2.74 cm^2. Valve area (Vmax): 2.72   cm^2. Valve area (Vmean): 2.7 cm^2. - Mitral valve: Transvalvular velocity was within the normal range.   There was no evidence for stenosis. There was no regurgitation. - Left atrium: The atrium was moderately  dilated. - Right ventricle: The cavity size was normal. Wall thickness was   normal. Systolic function was normal. - Tricuspid valve: There was trivial regurgitation. - Pulmonary arteries: Systolic pressure was within the normal   range. PA peak pressure: 27 mm Hg (S).  Patient Profile     67 y.o. 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  Assessment & Plan    1. NSTEMI: Presented to [facility] with chest pain. Trop trended up with last reading at 3.3. EKG with new TWI in anterolateral leads. Patient is somewhat difficult historian. Suppose to be on coumadin for his mechanical valve but INR 1.1 on admission. He denied having had a cath in the past, but stress in 2015 was low risk. Will plan for cardiac cath in the morning as his episodes of loose stools are improving. Echo with normal EF and no WMA. -- has had several runs  of NSVT on telemetry, will further increase his coreg today.  2. Valvular disease s/p mechanical AVR: followed by his primary cardiologist. Suppose to be on coumadin at home, but INR was subtherapeutic on admission. Echo reported in cardiologist notes stable valve from 07/01/17.  -- IV heparin for now given plans for cath.   3. Seizure disorder s/p lobectomy: per primary  4. Diarrhea: Patient reports no hx of the same. RN spoke with wife who reports this has been an ongoing issue for weeks. C diff negative. Has had 2 loose stools this morning.    5. Hyponatremia: Na +130 on admission. Resolved.  Signed, Reino Bellis, NP  10/30/2017, 10:10 AM  Pager # [phone]   I have examined the patient and reviewed assessment and plan and discussed with patient.  Agree with above as stated.  Plan for cath.  He is still quite sleepy, which was also reported by his daughter to me yesterday.  Heparin for mechanical aortic valve.  Crisp S2 click  on exam.  Diarrhea has been a persistent issue as well. Nees to stop smoking and be compliant with his CPAP therapy.   Larae Grooms   For questions or updates, please contact [facility] Please consult [email or web address] for contact info under Cardiology/STEMI.

## 2017-10-31 ENCOUNTER — Encounter (HOSPITAL_COMMUNITY): Payer: Self-pay | Admitting: Internal Medicine

## 2017-10-31 ENCOUNTER — Encounter (HOSPITAL_COMMUNITY)
Admission: EM | Disposition: A | Discharge status: Home / Self Care | Payer: Self-pay | Source: Home / Self Care | Attending: Internal Medicine

## 2017-10-31 DIAGNOSIS — I2510 Atherosclerotic heart disease of native coronary artery without angina pectoris: Secondary | ICD-10-CM

## 2017-10-31 HISTORY — PX: CORONARY/GRAFT ANGIOGRAPHY: CATH118237

## 2017-10-31 LAB — HEPARIN LEVEL (UNFRACTIONATED)
Heparin Unfractionated: 0.31 IU/mL (ref 0.30–0.70)
Heparin Unfractionated: 0.21 IU/mL — ABNORMAL LOW (ref 0.30–0.70)

## 2017-10-31 LAB — GLUCOSE, CAPILLARY
Glucose-Capillary: 142 mg/dL — ABNORMAL HIGH (ref 70–99)
Glucose-Capillary: 128 mg/dL — ABNORMAL HIGH (ref 70–99)
Glucose-Capillary: 150 mg/dL — ABNORMAL HIGH (ref 70–99)
Glucose-Capillary: 146 mg/dL — ABNORMAL HIGH (ref 70–99)

## 2017-10-31 LAB — CBC
HCT: 42.2 % (ref 39.0–52.0)
Hemoglobin: 13.8 g/dL (ref 13.0–17.0)
MCH: 27.8 pg (ref 26.0–34.0)
MCHC: 32.7 g/dL (ref 30.0–36.0)
MCV: 85.1 fL (ref 78.0–100.0)
Platelets: 159 10*3/uL (ref 150–400)
RBC: 4.96 MIL/uL (ref 4.22–5.81)
RDW: 15.9 % — ABNORMAL HIGH (ref 11.5–15.5)
WBC: 9.3 10*3/uL (ref 4.0–10.5)

## 2017-10-31 LAB — BASIC METABOLIC PANEL
Anion gap: 10 (ref 5–15)
BUN: 23 mg/dL (ref 8–23)
CO2: 20 mmol/L — ABNORMAL LOW (ref 22–32)
Calcium: 8.1 mg/dL — ABNORMAL LOW (ref 8.9–10.3)
Chloride: 104 mmol/L (ref 98–111)
Creatinine, Ser: 1.06 mg/dL (ref 0.61–1.24)
GFR calc Af Amer: >60 mL/min (ref >60)
GFR calc non Af Amer: >60 mL/min (ref >60)
Glucose, Bld: 137 mg/dL — ABNORMAL HIGH (ref 70–99)
Potassium: 4.4 mmol/L (ref 3.5–5.1)
Sodium: 134 mmol/L — ABNORMAL LOW (ref 135–145)

## 2017-10-31 LAB — PROTIME-INR
INR: 1.46
Prothrombin Time: 17.6 seconds — ABNORMAL HIGH (ref 11.4–15.2)

## 2017-10-31 SURGERY — CORONARY/GRAFT ANGIOGRAPHY
Anesthesia: LOCAL

## 2017-10-31 MED ORDER — VERAPAMIL HCL 2.5 MG/ML IV SOLN
INTRAVENOUS | Status: AC
  Filled 2017-10-31: qty 2

## 2017-10-31 MED ORDER — ASPIRIN 81 MG PO CHEW
81.0000 mg | CHEWABLE_TABLET | Freq: Once | ORAL | Status: AC
  Administered 2017-10-31: 81 mg via ORAL
  Filled 2017-10-31: qty 1

## 2017-10-31 MED ORDER — ASPIRIN 81 MG PO CHEW: 81.0000 mg | CHEWABLE_TABLET | Freq: Once | ORAL | Status: DC

## 2017-10-31 MED ORDER — IOHEXOL 350 MG/ML SOLN
INTRAVENOUS | Status: DC | PRN
  Administered 2017-10-31: 30 mL via INTRAVENOUS

## 2017-10-31 MED ORDER — HEPARIN SODIUM (PORCINE) 1000 UNIT/ML IJ SOLN
INTRAMUSCULAR | Status: DC | PRN
  Administered 2017-10-31: 5000 [IU] via INTRAVENOUS

## 2017-10-31 MED ORDER — HEPARIN (PORCINE) IN NACL 2-0.9 UNITS/ML
INTRAMUSCULAR | Status: DC | PRN
  Administered 2017-10-31: 10 mL via INTRA_ARTERIAL

## 2017-10-31 MED ORDER — WARFARIN SODIUM 5 MG PO TABS
5.0000 mg | ORAL_TABLET | Freq: Once | ORAL | Status: AC
Start: 2017-10-31 — End: 2017-10-31
  Administered 2017-10-31: 5 mg via ORAL
  Filled 2017-10-31: qty 2

## 2017-10-31 MED ORDER — HEPARIN (PORCINE) IN NACL 100-0.45 UNIT/ML-% IJ SOLN
1550.0000 [IU]/h | INTRAMUSCULAR | Status: DC
Start: 2017-10-31 — End: 2017-11-03
  Administered 2017-10-31: 1350 [IU]/h via INTRAVENOUS
  Administered 2017-11-01 – 2017-11-03 (×4): 1550 [IU]/h via INTRAVENOUS
  Filled 2017-10-31 (×4): qty 250

## 2017-10-31 MED ORDER — SODIUM CHLORIDE 0.9% FLUSH
3.0000 mL | Freq: Two times a day (BID) | INTRAVENOUS | Status: DC
  Administered 2017-10-31 – 2017-11-04 (×5): 3 mL via INTRAVENOUS

## 2017-10-31 MED ORDER — LIDOCAINE HCL (PF) 1 % IJ SOLN
INTRAMUSCULAR | Status: AC
  Filled 2017-10-31: qty 30

## 2017-10-31 MED ORDER — FENTANYL CITRATE (PF) 100 MCG/2ML IJ SOLN
INTRAMUSCULAR | Status: AC
  Filled 2017-10-31: qty 2

## 2017-10-31 MED ORDER — FENTANYL CITRATE (PF) 100 MCG/2ML IJ SOLN
INTRAMUSCULAR | Status: DC | PRN
  Administered 2017-10-31: 12.5 ug via INTRAVENOUS

## 2017-10-31 MED ORDER — HEPARIN (PORCINE) IN NACL 1000-0.9 UT/500ML-% IV SOLN
INTRAVENOUS | Status: AC
  Filled 2017-10-31: qty 1000

## 2017-10-31 MED ORDER — HEPARIN SODIUM (PORCINE) 1000 UNIT/ML IJ SOLN
INTRAMUSCULAR | Status: AC
  Filled 2017-10-31: qty 1

## 2017-10-31 MED ORDER — HEPARIN (PORCINE) IN NACL 1000-0.9 UT/500ML-% IV SOLN
INTRAVENOUS | Status: DC | PRN
  Administered 2017-10-31 (×2): 500 mL

## 2017-10-31 MED ORDER — ASPIRIN 81 MG PO CHEW
81.0000 mg | CHEWABLE_TABLET | Freq: Every day | ORAL | Status: DC
  Administered 2017-11-01 – 2017-11-04 (×4): 81 mg via ORAL
  Filled 2017-10-31 (×4): qty 1

## 2017-10-31 MED ORDER — SODIUM CHLORIDE 0.9 % IV SOLN
250.0000 mL | INTRAVENOUS | Status: DC | PRN
  Administered 2017-10-31: 250 mL via INTRAVENOUS

## 2017-10-31 MED ORDER — LIDOCAINE HCL (PF) 1 % IJ SOLN
INTRAMUSCULAR | Status: DC | PRN
  Administered 2017-10-31: 2 mL

## 2017-10-31 MED ORDER — WARFARIN - PHARMACIST DOSING INPATIENT
Freq: Every day | Status: DC
  Administered 2017-10-31 – 2017-11-03 (×4)

## 2017-10-31 MED ORDER — SODIUM CHLORIDE 0.9 % IV SOLN
INTRAVENOUS | Status: AC
  Administered 2017-10-31: 12:00:00 via INTRAVENOUS

## 2017-10-31 MED ORDER — SODIUM CHLORIDE 0.9% FLUSH: 3.0000 mL | INTRAVENOUS | Status: DC | PRN

## 2017-10-31 SURGICAL SUPPLY — 9 items
CATH 5FR JL3.5 JR4 ANG PIG MP (CATHETERS) ×2 IMPLANT
DEVICE RAD COMP TR BAND LRG (VASCULAR PRODUCTS) ×2 IMPLANT
GLIDESHEATH SLEND SS 6F .021 (SHEATH) ×2 IMPLANT
GUIDEWIRE INQWIRE 1.5J.035X260 (WIRE) ×1 IMPLANT
INQWIRE 1.5J .035X260CM (WIRE) ×2
KIT HEART LEFT (KITS) ×2 IMPLANT
PACK CARDIAC CATHETERIZATION (CUSTOM PROCEDURE TRAY) ×2 IMPLANT
TRANSDUCER W/STOPCOCK (MISCELLANEOUS) ×2 IMPLANT
TUBING CIL FLEX 10 FLL-RA (TUBING) ×2 IMPLANT

## 2017-10-31 NOTE — Interval H&P Note (Signed)
History and Physical Interval Note:  10/31/2017 9:25 AM  Adam Savage  has presented today for cardiac catheterization, with the diagnosis of NSTEMI. The various methods of treatment have been discussed with the patient and family. After consideration of risks, benefits and other options for treatment, the patient has consented to  Procedure(s): LEFT HEART CATH AND CORONARY ANGIOGRAPHY (N/A) as a surgical intervention .  The patient's history has been reviewed, patient examined, no change in status, stable for surgery.  I have reviewed the patient's chart and labs.  Questions were answered to the patient's satisfaction.    Cath Lab Visit (complete for each Cath Lab visit)  Clinical Evaluation Leading to the Procedure:   ACS: Yes.    Non-ACS:  N/A  Emmary Culbreath

## 2017-10-31 NOTE — Progress Notes (Addendum)
Progress Note  Patient Name: Adam Savage Date of Encounter: 10/31/2017  Primary Cardiologist: No primary care provider on file.   Subjective   No chest pain.  Inpatient Medications    Scheduled Meds: . [MAR Hold] atorvastatin  10 mg Oral q1800  . [MAR Hold] carvedilol  6.25 mg Oral BID WC  . [MAR Hold] escitalopram  20 mg Oral Daily  . [MAR Hold] Gerhardt's butt cream   Topical BID  . [MAR Hold] insulin aspart  0-5 Units Subcutaneous QHS  . [MAR Hold] insulin aspart  0-9 Units Subcutaneous TID WC  . [MAR Hold] levETIRAcetam  1,000 mg Oral BID  . [MAR Hold] pneumococcal 23 valent vaccine  0.5 mL Intramuscular Tomorrow-1000  . [MAR Hold] ramipril  5 mg Oral Daily  . sodium chloride flush  3 mL Intravenous Q12H  . [MAR Hold] tamsulosin  0.4 mg Oral QPM   Continuous Infusions: . sodium chloride    . sodium chloride 1 mL/kg/hr (10/31/17 0447)  . heparin Stopped (10/31/17 0910)   PRN Meds: sodium chloride, [MAR Hold] acetaminophen, [MAR Hold] alum & mag hydroxide-simeth, [MAR Hold] loperamide, [MAR Hold] ondansetron (ZOFRAN) IV, sodium chloride flush, [MAR Hold] zolpidem   Vital Signs    Vitals:   10/31/17 0525 10/31/17 0528 10/31/17 0750 10/31/17 0759  BP:  118/80 (!) 93/57 102/64  Pulse:  68 86 93  Resp:      Temp:  98.2 F (36.8 C) 98.4 F (36.9 C)   TempSrc:  Oral Oral   SpO2:  99% 98%   Weight: 261 lb 6.4 oz (118.6 kg)     Height:        Intake/Output Summary (Last 24 hours) at 10/31/2017 0933 Last data filed at 10/31/2017 0804 Gross per 24 hour  Intake 111.13 ml  Output 250 ml  Net -138.87 ml   Filed Weights   10/30/17 0400 10/30/17 1250 10/31/17 0525  Weight: 263 lb 7.2 oz (119.5 kg) 263 lb 7.2 oz (119.5 kg) 261 lb 6.4 oz (118.6 kg)    Telemetry    NSR, PACs - Personally Reviewed  ECG    None recent - Personally Reviewed  Physical Exam   GEN: No acute distress.   Neck: No JVD Cardiac: RRR, no murmurs, rubs, or gallops. Crisp S2  click Respiratory: Clear to auscultation bilaterally. GI: Soft, nontender, non-distended  MS: No edema; No deformity. Neuro:  Nonfocal  Psych: Normal affect   Labs    Chemistry Recent Labs  Lab 10/29/17 1714 10/30/17 0348 10/31/17 0820  NA 136 137 134*  K 4.0 4.0 4.4  CL 100 104 104  CO2 28 27 20*  GLUCOSE 183* 150* 137*  BUN 16 19 23   CREATININE 1.06 1.07 1.06  CALCIUM 8.6* 8.5* 8.1*  GFRNONAA >60 >60 >60  GFRAA >60 >60 >60  ANIONGAP 8 6 10      Hematology Recent Labs  Lab 10/29/17 0254 10/30/17 0348 10/31/17 0459  WBC 10.4 10.0 9.3  RBC 4.63 4.98 4.96  HGB 12.9* 13.6 13.8  HCT 38.7* 42.3 42.2  MCV 83.6 84.9 85.1  MCH 27.9 27.3 27.8  MCHC 33.3 32.2 32.7  RDW 15.8* 16.0* 15.9*  PLT 138* 141* 159    Cardiac Enzymes Recent Labs  Lab 10/28/17 1605 10/28/17 2054 10/28/17 2256 10/29/17 0254  TROPONINI 0.04* 0.63* 1.79* 3.33*   No results for input(s): TROPIPOC in the last 168 hours.   BNPNo results for input(s): BNP, PROBNP in the last 168 hours.  DDimer No results for input(s): DDIMER in the last 168 hours.   Radiology    No results found.  Cardiac Studies     Patient Profile     67 y.o. male with NSTEMI, AVR  Assessment & Plan    1) He is more awake today than any day I have sen him before.  Plan for cath today.  Restart COumadin post cath for AVR.  Difficult social situation with daughter taking care of the patient while the wife is away.  Will need case manager as well.  Addendum: Discussed cath results with Dr. Saunders Revel.  Posible thrombus noted in the apical LAD.  Too small for intervention.  Possible source is the prosthetic valve given that he was not adequately anticoagulated.  Diarrhea persists. Case manager consult has been placed.  Daughter is very interested in getting help for caring for the patient.      For questions or updates, please contact [facility] Please consult [email or web address] for contact info under Cardiology/STEMI.       Signed, Larae Grooms, MD  10/31/2017, 9:33 AM

## 2017-10-31 NOTE — Progress Notes (Signed)
[place] for heparin/coumadin Indication: chest pain/ACS and mechanical aortic valve (2002)  No Known Allergies  Patient Measurements: Height: 6' (182.9 cm) Weight: 261 lb 6.4 oz (118.6 kg) IBW/kg (Calculated) : 77.6 HEPARIN DW (KG): 104.3  Vital Signs: Temp: 98.5 F (36.9 C) (07/19 1141) Temp Source: Axillary (07/19 1141) BP: 92/60 (07/19 1258) Pulse Rate: 56 (07/19 1258)  Labs: Recent Labs    10/28/17 2054 10/28/17 2256 10/29/17 0254 10/29/17 1714 10/30/17 0348 10/31/17 0459 10/31/17 0820  HGB  --   --  12.9*  --  13.6 13.8  --   HCT  --   --  38.7*  --  42.3 42.2  --   PLT  --   --  138*  --  141* 159  --   LABPROT  --   --  17.7*  --  19.4* 17.6*  --   INR  --   --  1.47  --  1.65 1.46  --   HEPARINUNFRC  --   --   --  0.46 0.38 0.31  --   CREATININE  --   --   --  1.06 1.07  --  1.06  TROPONINI 0.63* 1.79* 3.33*  --   --   --   --     Estimated Creatinine Clearance: 89.9 mL/min (by C-G formula based on SCr of 1.06 mg/dL).   Medical History: Past Medical History:  Diagnosis Date  . COPD (chronic obstructive pulmonary disease) ([place])   . Mechanical heart valve present   . Pre-diabetes   . Seizures ([place])     Medications:  Scheduled:  . [START ON 11/01/2017] aspirin  81 mg Oral Daily  . atorvastatin  10 mg Oral q1800  . carvedilol  6.25 mg Oral BID WC  . escitalopram  20 mg Oral Daily  . Gerhardt's butt cream   Topical BID  . insulin aspart  0-5 Units Subcutaneous QHS  . insulin aspart  0-9 Units Subcutaneous TID WC  . levETIRAcetam  1,000 mg Oral BID  . pneumococcal 23 valent vaccine  0.5 mL Intramuscular Tomorrow-1000  . ramipril  5 mg Oral Daily  . sodium chloride flush  3 mL Intravenous Q12H  . tamsulosin  0.4 mg Oral QPM  . warfarin  5 mg Oral ONCE-1800  . Warfarin - Pharmacist Dosing Inpatient   Does not apply q1800   Infusions:  . sodium chloride 125 mL/hr at 10/31/17 1138  . sodium chloride       Assessment:  67 yo male with NSTEMI and positive troponins prompting transfer [facility]. Of note, patient also has mechanical aortic heart valve on warfarin PTA 7.5 mg daily except 5 mg MWF. Heparin turned off today for cardiac cath, previously therapeutic at 1350 units/hr. INR today subtherapeutic at 1.46.  Pharmacy consulted to restart heparin 2 hours post-TR band removal, as well as warfarin.  Goal of Therapy:  INR 2-3 Heparin level 0.3-0.7 units/ml Monitor platelets by anticoagulation protocol: Yes   Plan:  Restart PTA warfarin dose 5mg  x1 today Will plan to restart heparin at previous therapeutic dose of 1350 units/hr 2 hours after TR band is removed. Will plan to get heparin level 8 hours after resuming heparin Monitor daily INR, HL, CBC, s/sx of bleeding.  Thank you for involving pharmacy in this patient's care.  Janae Bridgeman, PharmD PGY1 Pharmacy Resident Phone: [phone] 10/31/2017 1:02 PM   ADDENDUM:  RN reports TR band removed at ~  1250. Will resume heparin in 2hrs at previous therapeutic rate.  Elicia Lamp, PharmD, BCPS Clinical Pharmacist Clinical phone [phone] Please check AMION for all [place] contact numbers 10/31/2017 1:02 PM

## 2017-10-31 NOTE — Progress Notes (Signed)
@IPLOG @        PROGRESS NOTE                                                                                                                                                                                                             Patient Demographics:    [facility], is a 67 y.o. male, DOB - Aug 10, 1950, XKG:[id number]  Admit date - 10/28/2017   Admitting Physician Kayleen Memos, DO  Outpatient Primary MD for the patient is Patient, [name]  LOS - 2  Chief Complaint  Patient presents with  . Chest Pain       Brief Narrative  Adam Reidis a 67 y.o.malewith past medical history significant ofvalvular heart disease status post mechanical valve around 2002, dilated aorta, hypertension, hyperlipidemia, OSA, seizure status post temporal lobectomy, who presented to [facility] with chest pain. Here from out of town visiting his family and grandkids when he started complaining of substernal chest pain that lasted less than 2 hours. Patient reports pain radiated down his left arm. Pain relieved by nitroglycerin. No associated shortness of breath with diaphoretic. Does not have history of heartburn. No fevers no cough no shortness of breath. Pain is completely resolved now.  Admitted for chest pain rule out ACS.  Troponin have been trending up and peaked at 3.33.     Subjective:    [facility] today has, No headache, No chest pain, No abdominal pain - No Nausea, No new weakness tingling or numbness, No Cough - SOB.    Assessment  & Plan :     1. NSTEMI - now pain free, post L.Heart Cath on 10/31/17 showing small vessel LAD lesion not stentable, Med Rx per Cards, on Hep/Coumadin, ASA, Coreg, statin.  2. Mech. Aortic Valve - stable, on Hep/Coumadin.  3. Seizures - continue Keppra.  4. BPH - on flomax.  5.HTN - continue Coreg, ACE and monitor.  6. Depression - stable on Lexapro.  7. 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  8. COPD - at baseline.  9.  DM2 - ISS  CBG (last 3)  Recent Labs    10/30/17 2027 10/31/17 0745 10/31/17 1138  GLUCAP 160* 142* 128*       Diet :   Diet Order           Diet Carb Modified Fluid consistency: Thin; Room service appropriate? Yes  Diet effective now           Family Communication  :  None present  Code Status :   Full  Disposition Plan  :  TBD  Consults  :  Cards  Procedures  :    L. Heart Cath -   CT - 1. No acute intracranial findings. 2. 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 3. 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  DVT Prophylaxis  :  Lovenox - Heparin/ Coumadin  Lab Results  Component Value Date   PLT 159 10/31/2017    Inpatient Medications  Scheduled Meds: . [START ON 11/01/2017] aspirin  81 mg Oral Daily  . atorvastatin  10 mg Oral q1800  . carvedilol  6.25 mg Oral BID WC  . escitalopram  20 mg Oral Daily  . Gerhardt's butt cream   Topical BID  . insulin aspart  0-5 Units Subcutaneous QHS  . insulin aspart  0-9 Units Subcutaneous TID WC  . levETIRAcetam  1,000 mg Oral BID  . pneumococcal 23 valent vaccine  0.5 mL Intramuscular Tomorrow-1000  . ramipril  5 mg Oral Daily  . sodium chloride flush  3 mL Intravenous Q12H  . tamsulosin  0.4 mg Oral QPM  . warfarin  5 mg Oral ONCE-1800  . Warfarin - Pharmacist Dosing Inpatient   Does not apply q1800   Continuous Infusions: . sodium chloride 125 mL/hr at 10/31/17 1138  . sodium chloride    . heparin     PRN Meds:.sodium chloride, acetaminophen, alum & mag hydroxide-simeth, loperamide, ondansetron (ZOFRAN) IV, sodium chloride flush, zolpidem  Antibiotics  :    Anti-infectives (From admission, onward)   None         Objective:   Vitals:   10/31/17 1125 10/31/17 1140 10/31/17 1141 10/31/17 1258  BP: 110/61 111/68 111/68 92/60  Pulse: (!) 58 62 60 (!) 56  Resp:      Temp:   98.5 F (36.9 C)   TempSrc:   Axillary    SpO2: 97% 100% 98% 99%  Weight:      Height:        Wt Readings from Last 3 Encounters:  10/31/17 118.6 kg (261 lb 6.4 oz)     Intake/Output Summary (Last 24 hours) at 10/31/2017 1419 Last data filed at 10/31/2017 1200 Gross per 24 hour  Intake 103.54 ml  Output 250 ml  Net -146.46 ml     Physical Exam  Awake Alert, Oriented X 3, No new F.N deficits, Normal affect [facility].AT,PERRAL Supple Neck,No JVD, No cervical lymphadenopathy appriciated.  Symmetrical Chest wall movement, Good air movement bilaterally, CTAB RRR,No Gallops,Rubs or new Murmurs, No Parasternal Heave +ve B.Sounds, Abd Soft, No tenderness, No organomegaly appriciated, No rebound - guarding or rigidity. No Cyanosis, Clubbing or edema, No new Rash or bruise       Data Review:    CBC Recent Labs  Lab 10/28/17 1313 10/29/17 0254 10/30/17 0348 10/31/17 0459  WBC 9.9 10.4 10.0 9.3  HGB 13.7 12.9* 13.6 13.8  HCT 40.5 38.7* 42.3 42.2  PLT 145* 138* 141* 159  MCV 84.0 83.6 84.9 85.1  MCH 28.4 27.9 27.3 27.8  MCHC 33.8 33.3 32.2 32.7  RDW 16.1* 15.8* 16.0* 15.9*  LYMPHSABS 1.1  --   --   --   MONOABS 0.7  --   --   --   EOSABS 0.1  --   --   --   BASOSABS 0.0  --   --   --     Chemistries  Recent Labs  Lab 10/28/17 1313 10/29/17 1714 10/30/17 0348 10/31/17 0820  NA 130* 136 137 134*  K 4.2 4.0 4.0 4.4  CL 98 100 104 104  CO2 24 28 27  20*  GLUCOSE 395* 183* 150* 137*  BUN 20 16 19 23   CREATININE 1.07 1.06 1.07 1.06  CALCIUM 8.5* 8.6* 8.5* 8.1*  MG  --  2.0  --   --    ------------------------------------------------------------------------------------------------------------------ No results for input(s): CHOL, HDL, LDLCALC, TRIG, CHOLHDL, LDLDIRECT in the last 72 hours.  Lab Results  Component Value Date   HGBA1C 8.6 (H) 10/28/2017   ------------------------------------------------------------------------------------------------------------------ No results for input(s): TSH, T4TOTAL,  T3FREE, THYROIDAB in the last 72 hours.  Invalid input(s): FREET3 ------------------------------------------------------------------------------------------------------------------ No results for input(s): VITAMINB12, FOLATE, FERRITIN, TIBC, IRON, RETICCTPCT in the last 72 hours.  Coagulation profile Recent Labs  Lab 10/28/17 1313 10/29/17 0254 10/30/17 0348 10/31/17 0459  INR 1.17 1.47 1.65 1.46    No results for input(s): DDIMER in the last 72 hours.  Cardiac Enzymes Recent Labs  Lab 10/28/17 2054 10/28/17 2256 10/29/17 0254  TROPONINI 0.63* 1.79* 3.33*   ------------------------------------------------------------------------------------------------------------------ No results found for: BNP  Micro Results Recent Results (from the past 240 hour(s))  MRSA PCR Screening     Status: None   Collection Time: 10/29/17  8:49 AM  Result Value Ref Range Status   MRSA by PCR NEGATIVE NEGATIVE Final    Comment:        The GeneXpert MRSA Assay (FDA approved for NASAL specimens only), is one component of a comprehensive MRSA colonization surveillance program. It is not intended to diagnose MRSA infection nor to guide or monitor treatment for MRSA infections. Performed at [facility], [place] [address] [place], [place] [address]   C difficile quick scan w PCR reflex     Status: None   Collection Time: 10/29/17  5:03 PM  Result Value Ref Range Status   C Diff antigen NEGATIVE NEGATIVE Final   C Diff toxin NEGATIVE NEGATIVE Final   C Diff interpretation No C. difficile detected.  Final    Comment: Performed at [facility], [place] [address], [place], [place] [address]    Radiology Reports Dg Chest 2 View  Result Date: 10/28/2017 CLINICAL DATA:  Chest pressure EXAM: CHEST - 2 VIEW COMPARISON:  None. FINDINGS: Lungs are clear. The heart size and pulmonary vascularity are normal. No adenopathy. There is aortic atherosclerosis. Patient is status post aortic valve  replacement. There is evidence of old healed trauma involving the lateral left clavicle. IMPRESSION: No edema or consolidation. Aortic atherosclerosis. Status post aortic valve replacement. Aortic Atherosclerosis (ICD10-I70.0). Electronically Signed   By: Lowella Grip III M.D.   On: 10/28/2017 13:33   Ct Head Wo Contrast  Result Date: 10/28/2017 CLINICAL DATA:  Left chest pressure radiating down the left arm EXAM: CT HEAD WITHOUT CONTRAST TECHNIQUE: Contiguous axial images were obtained from the base of the skull through the vertex without intravenous contrast. COMPARISON:  None. FINDINGS: Despite efforts by the technologist and patient, motion artifact is present on today's exam and could not be eliminated. This reduces exam sensitivity and specificity. Brain: Prominent encephalomalacia in the right temporal lobe. This has a chronic appearance. Overlying right frontotemporoparietal craniotomy. Otherwise, the brainstem, cerebellum, cerebral peduncles, thalami, basal ganglia, basilar cisterns, and ventricular system appear within normal limits. No intracranial hemorrhage, mass lesion, or acute CVA. Vascular: There is atherosclerotic calcification of the cavernous carotid arteries bilaterally. Skull: Right craniotomy as noted above.  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 Other: No supplemental non-categorized findings. IMPRESSION: 1. No acute intracranial findings. 2. 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 3. 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 Electronically Signed   By: Van Clines M.D.   On: 10/28/2017 19:02    Time Spent in minutes  30   Lala Lund M.D on 10/31/2017 at 2:19 PM  Between 7am to 7pm - Pager - [phone] ( page via [email or web address], text pages only, please mention full 10 digit call back number). After 7pm go to [email or web address] - password [email or web address]

## 2017-10-31 NOTE — Progress Notes (Signed)
ANTICOAGULATION CONSULT NOTE - Initial Consult  Pharmacy Consult for heparin/coumadin Indication: chest pain/ACS and mechanical aortic valve (2002)  No Known Allergies  Patient Measurements: Height: 6' (182.9 cm) Weight: 261 lb 6.4 oz (118.6 kg) IBW/kg (Calculated) : 77.6 HEPARIN DW (KG): 104.3  Vital Signs: Temp: 98.4 F (36.9 C) (07/19 0750) Temp Source: Oral (07/19 0750) BP: 95/80 (07/19 1050) Pulse Rate: 54 (07/19 1050)  Labs: Recent Labs    10/28/17 2054 10/28/17 2256 10/29/17 0254 10/29/17 1714 10/30/17 0348 10/31/17 0459 10/31/17 0820  HGB  --   --  12.9*  --  13.6 13.8  --   HCT  --   --  38.7*  --  42.3 42.2  --   PLT  --   --  138*  --  141* 159  --   LABPROT  --   --  17.7*  --  19.4* 17.6*  --   INR  --   --  1.47  --  1.65 1.46  --   HEPARINUNFRC  --   --   --  0.46 0.38 0.31  --   CREATININE  --   --   --  1.06 1.07  --  1.06  TROPONINI 0.63* 1.79* 3.33*  --   --   --   --     Estimated Creatinine Clearance: 89.9 mL/min (by C-G formula based on SCr of 1.06 mg/dL).   Medical History: Past Medical History:  Diagnosis Date  . COPD (chronic obstructive pulmonary disease) ([place])   . Mechanical heart valve present   . Pre-diabetes   . Seizures ([place])     Medications:  Scheduled:  . [START ON 11/01/2017] aspirin  81 mg Oral Daily  . atorvastatin  10 mg Oral q1800  . carvedilol  6.25 mg Oral BID WC  . escitalopram  20 mg Oral Daily  . Gerhardt's butt cream   Topical BID  . insulin aspart  0-5 Units Subcutaneous QHS  . insulin aspart  0-9 Units Subcutaneous TID WC  . levETIRAcetam  1,000 mg Oral BID  . pneumococcal 23 valent vaccine  0.5 mL Intramuscular Tomorrow-1000  . ramipril  5 mg Oral Daily  . sodium chloride flush  3 mL Intravenous Q12H  . tamsulosin  0.4 mg Oral QPM   Infusions:  . sodium chloride    . sodium chloride      Assessment:  67 yo male with NSTEMI and positive troponins prompting transfer from [facility] to [facility]. Of note,  patient also has mechanical aortic heart valve on warfarin PTA 7.5 mg daily except 5 mg MWF. Heparin turned off today for cardiac cath, previously therapeutic at 1350 units/hr. INR today subtherapeutic at 1.46.  Pharmacy consulted to restart heparin 2 hours post-TR band removal, as well as warfarin.  Goal of Therapy:  INR 2-3 Heparin level 0.3-0.7 units/ml Monitor platelets by anticoagulation protocol: Yes   Plan:  Restart PTA warfarin dose 5mg  x1 today Will plan to restart heparin at previous therapeutic dose of 1350 units/hr 2 hours after TR band is removed. Will plan to get heparin level 8 hours after resuming heparin Monitor daily INR, HL, CBC, s/sx of bleeding.  Thank you for involving pharmacy in this patient's care.  Janae Bridgeman, PharmD PGY1 Pharmacy Resident Phone: [phone] 10/31/2017 11:00 AM

## 2017-10-31 NOTE — Progress Notes (Signed)
Pt A/O forgetful.  Reminded not to use RUE multiple times.  Pt removed BP cuff off several times after being reminded. TR band is off. Level 0.  Idolina Primer, RN

## 2017-10-31 NOTE — Progress Notes (Signed)
ANTICOAGULATION CONSULT NOTE  Pharmacy Consult for heparin Indication: mechanical aortic valve (2002)  No Known Allergies  Patient Measurements: Height: 6' (182.9 cm) Weight: 261 lb 6.4 oz (118.6 kg) IBW/kg (Calculated) : 77.6 HEPARIN DW (KG): 104.3  Vital Signs: Temp: 98 F (36.7 C) (07/19 2036) Temp Source: Oral (07/19 2036) BP: 116/71 (07/19 2036) Pulse Rate: 66 (07/19 2036)  Labs: Recent Labs    10/28/17 2256  10/29/17 0254  10/29/17 1714 10/30/17 0348 10/31/17 0459 10/31/17 0820 10/31/17 2115  HGB  --    < > 12.9*  --   --  13.6 13.8  --   --   HCT  --   --  38.7*  --   --  42.3 42.2  --   --   PLT  --   --  138*  --   --  141* 159  --   --   LABPROT  --   --  17.7*  --   --  19.4* 17.6*  --   --   INR  --   --  1.47  --   --  1.65 1.46  --   --   HEPARINUNFRC  --   --   --    < > 0.46 0.38 0.31  --  0.21*  CREATININE  --   --   --   --  1.06 1.07  --  1.06  --   TROPONINI 1.79*  --  3.33*  --   --   --   --   --   --    < > = values in this interval not displayed.    Estimated Creatinine Clearance: 89.9 mL/min (by C-G formula based on SCr of 1.06 mg/dL).   Medical History: Past Medical History:  Diagnosis Date  . COPD (chronic obstructive pulmonary disease) ([place])   . Mechanical heart valve present   . Pre-diabetes   . Seizures ([place])     Medications:  Scheduled:  . [START ON 11/01/2017] aspirin  81 mg Oral Daily  . atorvastatin  10 mg Oral q1800  . carvedilol  6.25 mg Oral BID WC  . escitalopram  20 mg Oral Daily  . Gerhardt's butt cream   Topical BID  . insulin aspart  0-5 Units Subcutaneous QHS  . insulin aspart  0-9 Units Subcutaneous TID WC  . levETIRAcetam  1,000 mg Oral BID  . pneumococcal 23 valent vaccine  0.5 mL Intramuscular Tomorrow-1000  . ramipril  5 mg Oral Daily  . sodium chloride flush  3 mL Intravenous Q12H  . tamsulosin  0.4 mg Oral QPM  . Warfarin - Pharmacist Dosing Inpatient   Does not apply q1800   Infusions:  . sodium  chloride 250 mL (10/31/17 1531)  . heparin 1,350 Units/hr (10/31/17 1533)    Assessment:  67 yo male with NSTEMI and positive troponins prompting transfer from [facility] to [facility]. Of note, patient also has mechanical aortic heart valve on warfarin PTA 7.5 mg daily except 5 mg MWF. Pt is s/p cardiac cath with heparin resumed for bridge with warfarin for mechanical valve.  Heparin was resumed at previously therapeutic rate but level is now subtherapeutic.  No IV issues noted.    Goal of Therapy:  INR 2-3 Heparin level 0.3-0.7 units/ml Monitor platelets by anticoagulation protocol: Yes   Plan:  Increase heparin to 1550 units/hr Next heparin level with AM labs.  Monitor daily INR, HL, CBC, s/sx of bleeding.  Thank you  for involving pharmacy in this patient's care.  [employer], Pharm.D., BCPS Clinical Pharmacist Pager: [phone] Clinical phone for 10/31/2017 is [phone].  **Pharmacist phone directory can now be found on [email or web address] ([email or web address]).  Listed under [place].  10/31/2017 10:24 PM

## 2017-10-31 NOTE — Progress Notes (Signed)
Pt continues to have diarrhea this shift.  Had BM 6 times this shift.  Among that pt had at least yellow liquid stool x3. Administered imodium x2 this shift.  Idolina Primer, RN

## 2017-10-31 NOTE — Brief Op Note (Signed)
BRIEF CARDIAC CATHETERIZATION NOTE  10/31/2017  10:39 AM  PATIENT:  [facility]  67 y.o. male  PRE-OPERATIVE DIAGNOSIS:  nstemi  POST-OPERATIVE DIAGNOSIS:  * No post-op diagnosis entered *  PROCEDURE:  Procedure(s): LEFT HEART CATH AND CORONARY ANGIOGRAPHY (N/A)  SURGEON:  Surgeon(s) and Role:    * Valita Righter, MD - Primary  FINDINGS: 1. Subtotal occlusion of distal LAD, which is a small vessel (<2 mm).  Question embolism. 2. Otherwise, non-obstructive CAD. 3. Normal bileaflet mechanical aortic valve leaflet motion.  RECOMMENDATIONS: 1. Restart heparin 2 hours after TR band removal. 2. Warfarin per pharmacy. 3. Low-dose aspirin.  Nelva Bush, MD [facility] Pager: [phone]

## 2017-11-01 DIAGNOSIS — I214 Non-ST elevation (NSTEMI) myocardial infarction: Secondary | ICD-10-CM

## 2017-11-01 DIAGNOSIS — Z7901 Long term (current) use of anticoagulants: Secondary | ICD-10-CM

## 2017-11-01 LAB — PROTIME-INR
INR: 1.5
Prothrombin Time: 18 seconds — ABNORMAL HIGH (ref 11.4–15.2)

## 2017-11-01 LAB — BASIC METABOLIC PANEL
Anion gap: 7 (ref 5–15)
BUN: 23 mg/dL (ref 8–23)
CO2: 24 mmol/L (ref 22–32)
Calcium: 8.3 mg/dL — ABNORMAL LOW (ref 8.9–10.3)
Chloride: 105 mmol/L (ref 98–111)
Creatinine, Ser: 1.22 mg/dL (ref 0.61–1.24)
GFR calc Af Amer: >60 mL/min (ref >60)
GFR calc non Af Amer: 60 mL/min — ABNORMAL LOW (ref >60)
Glucose, Bld: 156 mg/dL — ABNORMAL HIGH (ref 70–99)
Potassium: 3.9 mmol/L (ref 3.5–5.1)
Sodium: 136 mmol/L (ref 135–145)

## 2017-11-01 LAB — GLUCOSE, CAPILLARY
Glucose-Capillary: 143 mg/dL — ABNORMAL HIGH (ref 70–99)
Glucose-Capillary: 160 mg/dL — ABNORMAL HIGH (ref 70–99)
Glucose-Capillary: 157 mg/dL — ABNORMAL HIGH (ref 70–99)

## 2017-11-01 LAB — CBC
HCT: 39.9 % (ref 39.0–52.0)
Hemoglobin: 13 g/dL (ref 13.0–17.0)
MCH: 27.6 pg (ref 26.0–34.0)
MCHC: 32.6 g/dL (ref 30.0–36.0)
MCV: 84.7 fL (ref 78.0–100.0)
Platelets: 148 10*3/uL — ABNORMAL LOW (ref 150–400)
RBC: 4.71 MIL/uL (ref 4.22–5.81)
RDW: 15.9 % — ABNORMAL HIGH (ref 11.5–15.5)
WBC: 10.2 10*3/uL (ref 4.0–10.5)

## 2017-11-01 LAB — HEPARIN LEVEL (UNFRACTIONATED)
Heparin Unfractionated: 0.35 IU/mL (ref 0.30–0.70)
Heparin Unfractionated: 0.55 IU/mL (ref 0.30–0.70)

## 2017-11-01 MED ORDER — WARFARIN SODIUM 7.5 MG PO TABS
7.5000 mg | ORAL_TABLET | Freq: Once | ORAL | Status: AC
  Administered 2017-11-01: 7.5 mg via ORAL
  Filled 2017-11-01: qty 1

## 2017-11-01 NOTE — Progress Notes (Signed)
.   Progress Note  Patient Name: [facility] Date of Encounter: 11/01/2017  Primary Cardiologist: [facility]; Dr. Regan Rakers  Subjective   No chest pain  Inpatient Medications    Scheduled Meds: . aspirin  81 mg Oral Daily  . atorvastatin  10 mg Oral q1800  . carvedilol  6.25 mg Oral BID WC  . escitalopram  20 mg Oral Daily  . Gerhardt's butt cream   Topical BID  . insulin aspart  0-5 Units Subcutaneous QHS  . insulin aspart  0-9 Units Subcutaneous TID WC  . levETIRAcetam  1,000 mg Oral BID  . ramipril  5 mg Oral Daily  . sodium chloride flush  3 mL Intravenous Q12H  . tamsulosin  0.4 mg Oral QPM  . Warfarin - Pharmacist Dosing Inpatient   Does not apply q1800   Continuous Infusions: . sodium chloride Stopped (10/31/17 2119)  . heparin 1,550 Units/hr (11/01/17 0900)   PRN Meds: sodium chloride, acetaminophen, alum & mag hydroxide-simeth, loperamide, ondansetron (ZOFRAN) IV, sodium chloride flush, zolpidem   Vital Signs    Vitals:   10/31/17 2036 10/31/17 2356 11/01/17 0403 11/01/17 0908  BP: 116/71 (!) 114/53 109/64 98/64  Pulse: 66 76 78 85  Resp: 16 16 16    Temp: 98 F (36.7 C) 97.7 F (36.5 C) 97.8 F (36.6 C)   TempSrc: Oral Oral Oral   SpO2: 98% 97% 98%   Weight:   261 lb (118.4 kg)   Height:        Intake/Output Summary (Last 24 hours) at 11/01/2017 1143 Last data filed at 11/01/2017 0900 Gross per 24 hour  Intake 1306.89 ml  Output 300 ml  Net 1006.89 ml    I/O since admission: +2061  Filed Weights   10/30/17 1250 10/31/17 0525 11/01/17 0403  Weight: 263 lb 7.2 oz (119.5 kg) 261 lb 6.4 oz (118.6 kg) 261 lb (118.4 kg)    Telemetry    Sinus - Personally Reviewed  ECG    ECG (independently read by me): NSR at 83; NST changes V4-6  Physical Exam   BP 98/64   Pulse 85   Temp 97.8 F (36.6 C) (Oral)   Resp 16   Ht 6' (1.829 m)   Wt 261 lb (118.4 kg)   SpO2 98%   BMI 35.40 kg/m  General: Alert, oriented, no distress.    Skin: normal turgor, no rashes, warm and dry HEENT: Normocephalic, atraumatic. Pupils equal round and reactive to light; sclera anicteric; extraocular muscles intact; Nose without nasal septal hypertrophy Mouth/Parynx benign; Mallinpatti scale 3 Neck: No JVD, no carotid bruits; normal carotid upstroke Lungs: clear to ausculatation and percussion; no wheezing or rales Chest wall: without tenderness to palpitation Heart: PMI not displaced, RRR, s1 s2 normal, 2-6/7 systolic murmur with crisp valve , no diastolic murmur, no rubs, gallops, thrills, or heaves Abdomen: soft, nontender; no hepatosplenomehaly, BS+; abdominal aorta nontender and not dilated by palpation. Back: no CVA tenderness Pulses 2+ Musculoskeletal: full range of motion, normal strength, no joint deformities Extremities: no clubbing cyanosis or edema, Homan's sign negative  Neurologic: grossly nonfocal; Cranial nerves grossly wnl Psychologic: Normal mood and affect  Labs    Chemistry Recent Labs  Lab 10/30/17 0348 10/31/17 0820 11/01/17 0358  NA 137 134* 136  K 4.0 4.4 3.9  CL 104 104 105  CO2 27 20* 24  GLUCOSE 150* 137* 156*  BUN 19 23 23   CREATININE 1.07 1.06 1.22  CALCIUM 8.5* 8.1* 8.3*  GFRNONAA >60 >60 60*  GFRAA >60 >60 >60  ANIONGAP 6 10 7      Hematology Recent Labs  Lab 10/30/17 0348 10/31/17 0459 11/01/17 0358  WBC 10.0 9.3 10.2  RBC 4.98 4.96 4.71  HGB 13.6 13.8 13.0  HCT 42.3 42.2 39.9  MCV 84.9 85.1 84.7  MCH 27.3 27.8 27.6  MCHC 32.2 32.7 32.6  RDW 16.0* 15.9* 15.9*  PLT 141* 159 148*    Cardiac Enzymes Recent Labs  Lab 10/28/17 1605 10/28/17 2054 10/28/17 2256 10/29/17 0254  TROPONINI 0.04* 0.63* 1.79* 3.33*   No results for input(s): TROPIPOC in the last 168 hours.   BNPNo results for input(s): BNP, PROBNP in the last 168 hours.   DDimer No results for input(s): DDIMER in the last 168 hours.   Lipid Panel  No results found for: CHOL, TRIG, HDL, CHOLHDL, VLDL, LDLCALC,  LDLDIRECT   Radiology    No results found.  Cardiac Studies   Cardiac Cath 10/31/17 Conclusions: 1. Subtotal occlusion of the distal LAD, possibly thrombotic/embolic in origin.  Otherwise, mild nonobstructive coronary artery disease.  Recommendations: 1. Medical therapy, given distal location and vessel size of distal LAD subtotal occlusion.  This lesion is not amenable to PCI. 2. Restart heparin 2 hours after TR band removal. 3. Restart warfarin and continue aspirin 81 mg daily.  Recommend to resume warfarin per pharmacy dosing on 10/31/17.  Recommend concurrent antiplatelet therapy of Aspirin 81mg  daily for long-term therapy.   Patient Profile     67 y.o. male with PMH of valvular heart disease s/p mechanical AVR ('02), possible CAD?, dilated aorta, HTN, HL, OSA and seizures s/p temporal lobectomy who presented to [facility] with chest pain.   Assessment & Plan    1. NSTEMI: probably 2/2 apical thrombus. On heparin, to re-start warfarin with mechanical valve.  2. AVR;  Coumadin was subtherapeutic on admission; restart with pharmacy  3. DM  4. Diarrhea; improved  5. HypoNa: improved to 136  Signed, Troy Sine, MD, [facility] 11/01/2017, 11:43 AM

## 2017-11-01 NOTE — Progress Notes (Signed)
@IPLOG @        PROGRESS NOTE                                                                                                                                                                                                             Patient Demographics:    Adam Savage, is a 67 y.o. male, DOB - 10-03-1950, VVO:[id number]  Admit date - 10/28/2017   Admitting Physician Kayleen Memos, DO  Outpatient Primary MD for the patient is Patient, [name]  LOS - 3  Chief Complaint  Patient presents with  . Chest Pain       Brief Narrative  Adam Reidis a 67 y.o.malewith past medical history significant ofvalvular heart disease status post mechanical valve around 2002, dilated aorta, hypertension, hyperlipidemia, OSA, seizure status post temporal lobectomy, who presented to [facility] emergency department with chest pain. Here from out of town visiting his family and grandkids when he started complaining of substernal chest pain that lasted less than 2 hours. Patient reports pain radiated down his left arm. Pain relieved by nitroglycerin. No associated shortness of breath with diaphoretic. Does not have history of heartburn. No fevers no cough no shortness of breath. Pain is completely resolved now.  Admitted for chest pain rule out ACS.  Troponin have been trending up and peaked at 3.33.     Subjective:   Patient in bed, appears comfortable, denies any headache, no fever, no chest pain or pressure, no shortness of breath , no abdominal pain. No focal weakness.   Assessment  & Plan :     1. NSTEMI - now pain free, post L.Heart Cath on 10/31/17 showing small vessel LAD lesion not stentable, Med Rx per Cards, on Hep/Coumadin, ASA, Coreg, statin.  2. Mech. Aortic Valve - stable, on Hep/Coumadin.  3. Seizures - continue Keppra.  4. BPH - on flomax.  5.HTN - continue Coreg, ACE and monitor.  6. Depression - stable on Lexapro.  7. 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  8. COPD -  at baseline.  9. DM2 - ISS  CBG (last 3)  Recent Labs    10/31/17 1138 10/31/17 1636 10/31/17 2040  GLUCAP 128* 150* 146*       Diet :   Diet Order           Diet Carb Modified Fluid consistency: Thin; Room service appropriate? Yes  Diet effective now           Family Communication  :  None present  Code  Status :   Full  Disposition Plan  :  TBD  Consults  :  Cards  Procedures  :    L. Heart Cath -   CT - 1. No acute intracranial findings. 2. 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 3. 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  DVT Prophylaxis  :  Heparin/ Coumadin  Lab Results  Component Value Date   PLT 148 (L) 11/01/2017    Inpatient Medications  Scheduled Meds: . aspirin  81 mg Oral Daily  . atorvastatin  10 mg Oral q1800  . carvedilol  6.25 mg Oral BID WC  . escitalopram  20 mg Oral Daily  . Gerhardt's butt cream   Topical BID  . insulin aspart  0-5 Units Subcutaneous QHS  . insulin aspart  0-9 Units Subcutaneous TID WC  . levETIRAcetam  1,000 mg Oral BID  . ramipril  5 mg Oral Daily  . sodium chloride flush  3 mL Intravenous Q12H  . tamsulosin  0.4 mg Oral QPM  . Warfarin - Pharmacist Dosing Inpatient   Does not apply q1800   Continuous Infusions: . sodium chloride Stopped (10/31/17 2119)  . heparin 1,550 Units/hr (11/01/17 0900)   PRN Meds:.sodium chloride, acetaminophen, alum & mag hydroxide-simeth, loperamide, ondansetron (ZOFRAN) IV, sodium chloride flush, zolpidem  Antibiotics  :    Anti-infectives (From admission, onward)   None         Objective:   Vitals:   10/31/17 2036 10/31/17 2356 11/01/17 0403 11/01/17 0908  BP: 116/71 (!) 114/53 109/64 98/64  Pulse: 66 76 78 85  Resp: 16 16 16    Temp: 98 F (36.7 C) 97.7 F (36.5 C) 97.8 F (36.6 C)   TempSrc: Oral Oral Oral   SpO2: 98% 97% 98%   Weight:   118.4 kg (261 lb)   Height:         Wt Readings from Last 3 Encounters:  11/01/17 118.4 kg (261 lb)     Intake/Output Summary (Last 24 hours) at 11/01/2017 1018 Last data filed at 11/01/2017 0900 Gross per 24 hour  Intake 1306.89 ml  Output 300 ml  Net 1006.89 ml     Physical Exam  Awake Alert, Oriented X 3, No new F.N deficits, Normal affect [place].AT,PERRAL Supple Neck,No JVD, No cervical lymphadenopathy appriciated.  Symmetrical Chest wall movement, Good air movement bilaterally, CTAB RRR,No Gallops, Rubs or new Murmurs, No Parasternal Heave +ve B.Sounds, Abd Soft, No tenderness, No organomegaly appriciated, No rebound - guarding or rigidity. No Cyanosis, Clubbing or edema, No new Rash or bruise    Data Review:    CBC Recent Labs  Lab 10/28/17 1313 10/29/17 0254 10/30/17 0348 10/31/17 0459 11/01/17 0358  WBC 9.9 10.4 10.0 9.3 10.2  HGB 13.7 12.9* 13.6 13.8 13.0  HCT 40.5 38.7* 42.3 42.2 39.9  PLT 145* 138* 141* 159 148*  MCV 84.0 83.6 84.9 85.1 84.7  MCH 28.4 27.9 27.3 27.8 27.6  MCHC 33.8 33.3 32.2 32.7 32.6  RDW 16.1* 15.8* 16.0* 15.9* 15.9*  LYMPHSABS 1.1  --   --   --   --   MONOABS 0.7  --   --   --   --   EOSABS 0.1  --   --   --   --   BASOSABS 0.0  --   --   --   --     Chemistries  Recent Labs  Lab 10/28/17 1313 10/29/17  1714 10/30/17 0348 10/31/17 0820 11/01/17 0358  NA 130* 136 137 134* 136  K 4.2 4.0 4.0 4.4 3.9  CL 98 100 104 104 105  CO2 24 28 27  20* 24  GLUCOSE 395* 183* 150* 137* 156*  BUN 20 16 19 23 23   CREATININE 1.07 1.06 1.07 1.06 1.22  CALCIUM 8.5* 8.6* 8.5* 8.1* 8.3*  MG  --  2.0  --   --   --    ------------------------------------------------------------------------------------------------------------------ No results for input(s): CHOL, HDL, LDLCALC, TRIG, CHOLHDL, LDLDIRECT in the last 72 hours.  Lab Results  Component Value Date   HGBA1C 8.6 (H) 10/28/2017    ------------------------------------------------------------------------------------------------------------------ No results for input(s): TSH, T4TOTAL, T3FREE, THYROIDAB in the last 72 hours.  Invalid input(s): FREET3 ------------------------------------------------------------------------------------------------------------------ No results for input(s): VITAMINB12, FOLATE, FERRITIN, TIBC, IRON, RETICCTPCT in the last 72 hours.  Coagulation profile Recent Labs  Lab 10/28/17 1313 10/29/17 0254 10/30/17 0348 10/31/17 0459 11/01/17 0358  INR 1.17 1.47 1.65 1.46 1.50    No results for input(s): DDIMER in the last 72 hours.  Cardiac Enzymes Recent Labs  Lab 10/28/17 2054 10/28/17 2256 10/29/17 0254  TROPONINI 0.63* 1.79* 3.33*   ------------------------------------------------------------------------------------------------------------------ No results found for: BNP  Micro Results Recent Results (from the past 240 hour(s))  MRSA PCR Screening     Status: None   Collection Time: 10/29/17  8:49 AM  Result Value Ref Range Status   MRSA by PCR NEGATIVE NEGATIVE Final    Comment:        The GeneXpert MRSA Assay (FDA approved for NASAL specimens only), is one component of a comprehensive MRSA colonization surveillance program. It is not intended to diagnose MRSA infection nor to guide or monitor treatment for MRSA infections. Performed at [facility], [place] [address] [place], [place] [address]   C difficile quick scan w PCR reflex     Status: None   Collection Time: 10/29/17  5:03 PM  Result Value Ref Range Status   C Diff antigen NEGATIVE NEGATIVE Final   C Diff toxin NEGATIVE NEGATIVE Final   C Diff interpretation No C. difficile detected.  Final    Comment: Performed at [facility], [place] [address], [place], [place] [address]    Radiology Reports Dg Chest 2 View  Result Date: 10/28/2017 CLINICAL DATA:  Chest pressure EXAM: CHEST - 2 VIEW  COMPARISON:  None. FINDINGS: Lungs are clear. The heart size and pulmonary vascularity are normal. No adenopathy. There is aortic atherosclerosis. Patient is status post aortic valve replacement. There is evidence of old healed trauma involving the lateral left clavicle. IMPRESSION: No edema or consolidation. Aortic atherosclerosis. Status post aortic valve replacement. Aortic Atherosclerosis (ICD10-I70.0). Electronically Signed   By: Lowella Grip III M.D.   On: 10/28/2017 13:33   Ct Head Wo Contrast  Result Date: 10/28/2017 CLINICAL DATA:  Left chest pressure radiating down the left arm EXAM: CT HEAD WITHOUT CONTRAST TECHNIQUE: Contiguous axial images were obtained from the base of the skull through the vertex without intravenous contrast. COMPARISON:  None. FINDINGS: Despite efforts by the technologist and patient, motion artifact is present on today's exam and could not be eliminated. This reduces exam sensitivity and specificity. Brain: Prominent encephalomalacia in the right temporal lobe. This has a chronic appearance. Overlying right frontotemporoparietal craniotomy. Otherwise, the brainstem, cerebellum, cerebral peduncles, thalami, basal ganglia, basilar cisterns, and ventricular system appear within normal limits. No intracranial hemorrhage, mass lesion, or acute CVA. Vascular: There is atherosclerotic calcification  of the cavernous carotid arteries bilaterally. Skull: Right craniotomy as noted above. 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 Other: No supplemental non-categorized findings. IMPRESSION: 1. No acute intracranial findings. 2. 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 3. 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 Electronically Signed   By: Van Clines M.D.   On: 10/28/2017 19:02    Time Spent in minutes  30   Lala Lund M.D on 11/01/2017 at 10:18 AM  To  page go to [email or web address] - password [facility]

## 2017-11-01 NOTE — Progress Notes (Signed)
[place] for Heparin  Indication: chest pain/ACS and mechanical aortic valve (2002)  No Known Allergies  Patient Measurements: Height: 6' (182.9 cm) Weight: 261 lb (118.4 kg) IBW/kg (Calculated) : 77.6 HEPARIN DW (KG): 104.3  Vital Signs: Temp: 97.8 F (36.6 C) (07/20 0403) Temp Source: Oral (07/20 0403) BP: 109/64 (07/20 0403) Pulse Rate: 78 (07/20 0403)  Labs: Recent Labs    10/29/17 1714  10/30/17 0348 10/31/17 0459 10/31/17 0820 10/31/17 2115 11/01/17 0358  HGB  --    < > 13.6 13.8  --   --  13.0  HCT  --   --  42.3 42.2  --   --  39.9  PLT  --   --  141* 159  --   --  148*  LABPROT  --   --  19.4* 17.6*  --   --  18.0*  INR  --   --  1.65 1.46  --   --  1.50  HEPARINUNFRC 0.46  --  0.38 0.31  --  0.21* 0.35  CREATININE 1.06  --  1.07  --  1.06  --   --    < > = values in this interval not displayed.    Estimated Creatinine Clearance: 89.8 mL/min (by C-G formula based on SCr of 1.06 mg/dL).   Medical History: Past Medical History:  Diagnosis Date  . COPD (chronic obstructive pulmonary disease) ([place])   . Mechanical heart valve present   . Pre-diabetes   . Seizures (HCC)     Medications:  Scheduled:  . aspirin  81 mg Oral Daily  . atorvastatin  10 mg Oral q1800  . carvedilol  6.25 mg Oral BID WC  . escitalopram  20 mg Oral Daily  . Gerhardt's butt cream   Topical BID  . insulin aspart  0-5 Units Subcutaneous QHS  . insulin aspart  0-9 Units Subcutaneous TID WC  . levETIRAcetam  1,000 mg Oral BID  . pneumococcal 23 valent vaccine  0.5 mL Intramuscular Tomorrow-1000  . ramipril  5 mg Oral Daily  . sodium chloride flush  3 mL Intravenous Q12H  . tamsulosin  0.4 mg Oral QPM  . Warfarin - Pharmacist Dosing Inpatient   Does not apply q1800   Infusions:  . sodium chloride 250 mL (10/31/17 1531)  . heparin 1,550 Units/hr (11/01/17 0515)    Assessment:  67 yo male with NSTEMI and positive troponins prompting  transfer from [facility] to [facility]. Of note, patient also has mechanical aortic heart valve on warfarin PTA 7.5 mg daily except 5 mg MWF. Heparin turned off today for cardiac cath, previously therapeutic at 1350 units/hr. INR today subtherapeutic at 1.46.  Pharmacy consulted to restart heparin 2 hours post-TR band removal, as well as warfarin.  7/20 AM update: pt s/p cath, on heparin, heparin level therapeutic x 1 after rate increase, INR 1.5   Goal of Therapy:  INR 2-3 Heparin level 0.3-0.7 units/ml Monitor platelets by anticoagulation protocol: Yes   Plan:  Cont heparin at 1550 units/hr 1200 confirmatory heparin level  [name], PharmD, BCPS Clinical Pharmacist Phone: [phone]

## 2017-11-01 NOTE — Progress Notes (Signed)
[place] for Heparin  Indication: chest pain/ACS and mechanical aortic valve (2002)  No Known Allergies  Patient Measurements: Height: 6' (182.9 cm) Weight: 261 lb (118.4 kg) IBW/kg (Calculated) : 77.6 HEPARIN DW (KG): 104.3  Vital Signs: Temp: 97.8 F (36.6 C) (07/20 0403) Temp Source: Oral (07/20 0403) BP: 98/64 (07/20 0908) Pulse Rate: 85 (07/20 0908)  Labs: Recent Labs    10/30/17 0348 10/31/17 0459 10/31/17 0820 10/31/17 2115 11/01/17 0358 11/01/17 1133  HGB 13.6 13.8  --   --  13.0  --   HCT 42.3 42.2  --   --  39.9  --   PLT 141* 159  --   --  148*  --   LABPROT 19.4* 17.6*  --   --  18.0*  --   INR 1.65 1.46  --   --  1.50  --   HEPARINUNFRC 0.38 0.31  --  0.21* 0.35 0.55  CREATININE 1.07  --  1.06  --  1.22  --     Estimated Creatinine Clearance: 78 mL/min (by C-G formula based on SCr of 1.22 mg/dL).   Medical History: Past Medical History:  Diagnosis Date  . COPD (chronic obstructive pulmonary disease) ([place])   . Mechanical heart valve present   . Pre-diabetes   . Seizures (HCC)     Medications:  Scheduled:  . aspirin  81 mg Oral Daily  . atorvastatin  10 mg Oral q1800  . carvedilol  6.25 mg Oral BID WC  . escitalopram  20 mg Oral Daily  . Gerhardt's butt cream   Topical BID  . insulin aspart  0-5 Units Subcutaneous QHS  . insulin aspart  0-9 Units Subcutaneous TID WC  . levETIRAcetam  1,000 mg Oral BID  . ramipril  5 mg Oral Daily  . sodium chloride flush  3 mL Intravenous Q12H  . tamsulosin  0.4 mg Oral QPM  . Warfarin - Pharmacist Dosing Inpatient   Does not apply q1800   Infusions:  . sodium chloride Stopped (10/31/17 2119)  . heparin 1,550 Units/hr (11/01/17 0900)    Assessment:  67 yo male with NSTEMI and positive troponins prompting transfer [facility] to [facility]. Of note, patient also has mechanical aortic heart valve on warfarin PTA 7.5 mg daily except 5 mg MWF. Pharmacy consulted to restart  warfarin and heparin yesterday after cardiac cath.  INR today subtherapeutic at 1.5 Heparin level therapeutic at 0.55 No s/sx of bleeding, CBC stable.  Goal of Therapy:  INR 2-3 Heparin level 0.3-0.7 units/ml Monitor platelets by anticoagulation protocol: Yes   Plan:  Continue heparin at 1550 units/hr Continue PTA dosed warfarin 7.5mg  x1 today Monitor daily Heparin level, INR, CBC  Thank you for involving pharmacy in this patient's care.  Janae Bridgeman, PharmD PGY1 Pharmacy Resident Phone: [phone] 11/01/2017 12:31 PM

## 2017-11-02 LAB — HEMOGLOBIN A1C
Hgb A1c MFr Bld: 8.3 % — ABNORMAL HIGH (ref 4.8–5.6)
Mean Plasma Glucose: 191.51 mg/dL

## 2017-11-02 LAB — GLUCOSE, CAPILLARY
Glucose-Capillary: 180 mg/dL — ABNORMAL HIGH (ref 70–99)
Glucose-Capillary: 157 mg/dL — ABNORMAL HIGH (ref 70–99)
Glucose-Capillary: 144 mg/dL — ABNORMAL HIGH (ref 70–99)
Glucose-Capillary: 133 mg/dL — ABNORMAL HIGH (ref 70–99)

## 2017-11-02 LAB — CBC
HCT: 38.9 % — ABNORMAL LOW (ref 39.0–52.0)
Hemoglobin: 12.6 g/dL — ABNORMAL LOW (ref 13.0–17.0)
MCH: 27.6 pg (ref 26.0–34.0)
MCHC: 32.4 g/dL (ref 30.0–36.0)
MCV: 85.3 fL (ref 78.0–100.0)
Platelets: 149 10*3/uL — ABNORMAL LOW (ref 150–400)
RBC: 4.56 MIL/uL (ref 4.22–5.81)
RDW: 15.9 % — ABNORMAL HIGH (ref 11.5–15.5)
WBC: 10.1 10*3/uL (ref 4.0–10.5)

## 2017-11-02 LAB — HEPARIN LEVEL (UNFRACTIONATED): Heparin Unfractionated: 0.46 IU/mL (ref 0.30–0.70)

## 2017-11-02 LAB — BASIC METABOLIC PANEL
Anion gap: 7 (ref 5–15)
BUN: 23 mg/dL (ref 8–23)
CO2: 24 mmol/L (ref 22–32)
Calcium: 8.3 mg/dL — ABNORMAL LOW (ref 8.9–10.3)
Chloride: 105 mmol/L (ref 98–111)
Creatinine, Ser: 1.12 mg/dL (ref 0.61–1.24)
GFR calc Af Amer: >60 mL/min (ref >60)
GFR calc non Af Amer: >60 mL/min (ref >60)
Glucose, Bld: 127 mg/dL — ABNORMAL HIGH (ref 70–99)
Potassium: 4.3 mmol/L (ref 3.5–5.1)
Sodium: 136 mmol/L (ref 135–145)

## 2017-11-02 LAB — LIPID PANEL
Cholesterol: 110 mg/dL (ref 0–200)
HDL: 36 mg/dL — ABNORMAL LOW (ref >40)
LDL Cholesterol: 60 mg/dL (ref 0–99)
Total CHOL/HDL Ratio: 3.1 RATIO
Triglycerides: 69 mg/dL (ref <150)
VLDL: 14 mg/dL (ref 0–40)

## 2017-11-02 LAB — PROTIME-INR
INR: 1.72
Prothrombin Time: 20 seconds — ABNORMAL HIGH (ref 11.4–15.2)

## 2017-11-02 MED ORDER — LOPERAMIDE HCL 2 MG PO CAPS: 2.0000 mg | ORAL_CAPSULE | Freq: Four times a day (QID) | ORAL | Status: DC | PRN

## 2017-11-02 MED ORDER — LOPERAMIDE HCL 2 MG PO CAPS
4.0000 mg | ORAL_CAPSULE | Freq: Once | ORAL | Status: AC
  Administered 2017-11-02: 4 mg via ORAL
  Filled 2017-11-02: qty 2

## 2017-11-02 MED ORDER — WARFARIN SODIUM 7.5 MG PO TABS
7.5000 mg | ORAL_TABLET | Freq: Once | ORAL | Status: AC
  Administered 2017-11-02: 7.5 mg via ORAL
  Filled 2017-11-02: qty 1

## 2017-11-02 NOTE — Progress Notes (Addendum)
[place] for Heparin  Indication: chest pain/ACS and mechanical aortic valve (2002)  No Known Allergies  Patient Measurements: Height: 6' (182.9 cm) Weight: 259 lb 4.8 oz (117.6 kg) IBW/kg (Calculated) : 77.6 HEPARIN DW (KG): 104.3  Vital Signs: Temp: 98 F (36.7 C) (07/21 1124) Temp Source: Oral (07/21 1124) BP: 92/58 (07/21 1124) Pulse Rate: 56 (07/21 1124)  Labs: Recent Labs    10/31/17 0459 10/31/17 0820  11/01/17 0358 11/01/17 1133 11/02/17 0441  HGB 13.8  --   --  13.0  --  12.6*  HCT 42.2  --   --  39.9  --  38.9*  PLT 159  --   --  148*  --  149*  LABPROT 17.6*  --   --  18.0*  --  20.0*  INR 1.46  --   --  1.50  --  1.72  HEPARINUNFRC 0.31  --    < > 0.35 0.55 0.46  CREATININE  --  1.06  --  1.22  --  1.12   < > = values in this interval not displayed.    Estimated Creatinine Clearance: 84.7 mL/min (by C-G formula based on SCr of 1.12 mg/dL).   Medical History: Past Medical History:  Diagnosis Date  . COPD (chronic obstructive pulmonary disease) ([place])   . Mechanical heart valve present   . Pre-diabetes   . Seizures (HCC)     Medications:  Scheduled:  . aspirin  81 mg Oral Daily  . atorvastatin  10 mg Oral q1800  . carvedilol  6.25 mg Oral BID WC  . escitalopram  20 mg Oral Daily  . Gerhardt's butt cream   Topical BID  . insulin aspart  0-5 Units Subcutaneous QHS  . insulin aspart  0-9 Units Subcutaneous TID WC  . levETIRAcetam  1,000 mg Oral BID  . ramipril  5 mg Oral Daily  . sodium chloride flush  3 mL Intravenous Q12H  . tamsulosin  0.4 mg Oral QPM  . Warfarin - Pharmacist Dosing Inpatient   Does not apply q1800   Infusions:  . sodium chloride Stopped (10/31/17 2119)  . heparin 1,550 Units/hr (11/02/17 1139)    Assessment:  67 yo male with NSTEMI and positive troponins prompting transfer from [facility] to [facility]. Of note, patient also has mechanical aortic heart valve on warfarin PTA 7.5 mg daily  except 5 mg MWF. Pharmacy consulted to manage warfarin and heparin.  INR today subtherapeutic at 1.72 Heparin level therapeutic at 0.46 No s/sx of bleeding, CBC stable.  Goal of Therapy:  INR 2-3 Heparin level 0.3-0.7 units/ml Monitor platelets by anticoagulation protocol: Yes   Plan:  Continue heparin at 1550 units/hr Continue PTA dosed warfarin 7.5mg  x1 today Monitor daily Heparin level, INR, CBC  Thank you for involving pharmacy in this patient's care.  Janae Bridgeman, PharmD PGY1 Pharmacy Resident Phone: [phone] 11/02/2017 12:05 PM

## 2017-11-02 NOTE — Progress Notes (Addendum)
Progress Note  Patient Name: Adam Savage Date of Encounter: 11/02/2017  Primary Cardiologist: [facility]; Dr. Regan Rakers  Subjective   Denies any CP or SOB.   Inpatient Medications    Scheduled Meds: . aspirin  81 mg Oral Daily  . atorvastatin  10 mg Oral q1800  . carvedilol  6.25 mg Oral BID WC  . escitalopram  20 mg Oral Daily  . Gerhardt's butt cream   Topical BID  . insulin aspart  0-5 Units Subcutaneous QHS  . insulin aspart  0-9 Units Subcutaneous TID WC  . levETIRAcetam  1,000 mg Oral BID  . ramipril  5 mg Oral Daily  . sodium chloride flush  3 mL Intravenous Q12H  . tamsulosin  0.4 mg Oral QPM  . Warfarin - Pharmacist Dosing Inpatient   Does not apply q1800   Continuous Infusions: . sodium chloride Stopped (10/31/17 2119)  . heparin 1,550 Units/hr (11/01/17 1902)   PRN Meds: sodium chloride, acetaminophen, alum & mag hydroxide-simeth, loperamide, ondansetron (ZOFRAN) IV, sodium chloride flush, zolpidem   Vital Signs    Vitals:   11/01/17 2024 11/02/17 0028 11/02/17 0448 11/02/17 0758  BP: (!) 103/43 101/68 113/61 119/65  Pulse: 61 61 61 (!) 57  Resp: 16 16 16 16   Temp: 98.3 F (36.8 C) 98.1 F (36.7 C) 97.6 F (36.4 C) 97.9 F (36.6 C)  TempSrc: Oral Oral Oral Oral  SpO2: 96% 97% 99% 98%  Weight:   259 lb 4.8 oz (117.6 kg)   Height:        Intake/Output Summary (Last 24 hours) at 11/02/2017 0854 Last data filed at 11/02/2017 0300 Gross per 24 hour  Intake 404.81 ml  Output 425 ml  Net -20.19 ml   Filed Weights   10/31/17 0525 11/01/17 0403 11/02/17 0448  Weight: 261 lb 6.4 oz (118.6 kg) 261 lb (118.4 kg) 259 lb 4.8 oz (117.6 kg)    Telemetry    NSR without significant ventricular ectopy - Personally Reviewed  ECG    No EKG - Personally Reviewed  Physical Exam   GEN: No acute distress.   Neck: No JVD Cardiac: RRR, no murmurs, rubs, or gallops. Clicks Respiratory: Clear to auscultation bilaterally. GI: Soft, nontender,  non-distended  MS: No edema; No deformity. Neuro:  Nonfocal  Psych: Normal affect   Labs    Chemistry Recent Labs  Lab 10/31/17 0820 11/01/17 0358 11/02/17 0441  NA 134* 136 136  K 4.4 3.9 4.3  CL 104 105 105  CO2 20* 24 24  GLUCOSE 137* 156* 127*  BUN 23 23 23   CREATININE 1.06 1.22 1.12  CALCIUM 8.1* 8.3* 8.3*  GFRNONAA >60 60* >60  GFRAA >60 >60 >60  ANIONGAP 10 7 7      Hematology Recent Labs  Lab 10/31/17 0459 11/01/17 0358 11/02/17 0441  WBC 9.3 10.2 10.1  RBC 4.96 4.71 4.56  HGB 13.8 13.0 12.6*  HCT 42.2 39.9 38.9*  MCV 85.1 84.7 85.3  MCH 27.8 27.6 27.6  MCHC 32.7 32.6 32.4  RDW 15.9* 15.9* 15.9*  PLT 159 148* 149*    Cardiac Enzymes Recent Labs  Lab 10/28/17 1605 10/28/17 2054 10/28/17 2256 10/29/17 0254  TROPONINI 0.04* 0.63* 1.79* 3.33*   No results for input(s): TROPIPOC in the last 168 hours.   BNPNo results for input(s): BNP, PROBNP in the last 168 hours.   DDimer No results for input(s): DDIMER in the last 168 hours.   Lipid Panel  Component Value Date/Time   CHOL 110 11/02/2017 0441   TRIG 69 11/02/2017 0441   HDL 36 (L) 11/02/2017 0441   CHOLHDL 3.1 11/02/2017 0441   VLDL 14 11/02/2017 0441   LDLCALC 60 11/02/2017 0441   Radiology    No results found.  Cardiac Studies   Echo 10/29/2017 LV EF: 55% -   60% Study Conclusions  - Left ventricle: The cavity size was normal. There was severe   hypertrophy of the basal septum with otherwise moderate   concentric LVH. Systolic function was normal. The estimated   ejection fraction was in the range of 55% to 60%. Wall motion was   normal; there were no regional wall motion abnormalities. Doppler   parameters are consistent with abnormal left ventricular   relaxation (grade 1 diastolic dysfunction). Doppler parameters   are consistent with indeterminate ventricular filling pressure. - Aortic valve: A bioprosthesis was present and functioning   properly. Valve area (VTI):  2.74 cm^2. Valve area (Vmax): 2.72   cm^2. Valve area (Vmean): 2.7 cm^2. - Mitral valve: Transvalvular velocity was within the normal range.   There was no evidence for stenosis. There was no regurgitation. - Left atrium: The atrium was moderately dilated. - Right ventricle: The cavity size was normal. Wall thickness was   normal. Systolic function was normal. - Tricuspid valve: There was trivial regurgitation. - Pulmonary arteries: Systolic pressure was within the normal   range. PA peak pressure: 27 mm Hg (S).    Cath 10/31/2017 Conclusions: 1. Subtotal occlusion of the distal LAD, possibly thrombotic/embolic in origin.  Otherwise, mild nonobstructive coronary artery disease.  Recommendations: 1. Medical therapy, given distal location and vessel size of distal LAD subtotal occlusion.  This lesion is not amenable to PCI. 2. Restart heparin 2 hours after TR band removal. 3. Restart warfarin and continue aspirin 81 mg daily.  Recommend to resume warfarin per pharmacy dosing on 10/31/17.  Recommend concurrent antiplatelet therapy of Aspirin 81mg  daily for long-term therapy.  Patient Profile     67 y.o. male with PMH of valvular heart disease s/p mechanical valve ('02), possible CAD?, dilated aorta, HTN, HL, OSA and seizures s/p temporal lobectomy presented to [facility] ED for chest pain. Transferred to [facility] due to elevated trop  Assessment & Plan    1. NSTEMI  - EKG showed new TWI in anterolateral leads  - cath 10/31/2017 subtotal occlusion of distal LAD possibly due to embolism. Otherwise nonobstructive CAD. Normal bileaflet mechanical aortic valve. Recommend low dose ASA on top of coumadin.   - he is on appropriate meds at this point  2. Valvular disease s/p mechanical AVR: INR subtherapeutic on admission.   - waiting for INR to become therapeutic before discharge. INR 1.72 today.    3. Seizure disorder s/p lobectomy  4. Diarrhea  5. Hyponatremia  No other cardiac issue  at this point, just waiting for INR to become therapeutic. On appropriate cardiac meds at this point, will defer to MD to decide whether or not to sign off.    For questions or updates, please contact [place] Please consult [email or web address] for contact info under Cardiology/STEMI.      Hilbert Corrigan, PA  11/02/2017, 8:54 AM     Patient seen and examined. Agree with assessment and plan. No recurrent chest pain.  Telemetry reveals sinus bradycardia in the 50s.  He is not having any dyspnea, chest pain or ectopy.  Crisp valve sounds.  INR is subtherapeutic at 1.7 to  valley reinstitution of warfarin.  Target INR is 2.5-3.0.  Patient has been followed at [facility] and vascular and has seen Dr. Regan Rakers.  He was here visiting his daughter but plans possibly to stay in the [place] area for the next year or 2.  If so he will need to establish cardiology care locally unless he is still planning to return to Dr. Regan Rakers.  [facility] will sign off.   Medication Recommendations: Continue beta-blocker, ASA, warfarin and ACE-I Other recommendations (labs, testing, etc): He will need to establish in our Coumadin clinic.  Follow up as an outpatient: If patient plans cardiology evaluation in [place], he was initially seen by Dr. Irish Lack.   Troy Sine, MD, [facility] 11/02/2017 9:30 AM

## 2017-11-02 NOTE — Progress Notes (Signed)
@IPLOG @        PROGRESS NOTE                                                                                                                                                                                                             Patient Demographics:    Adam Savage, is a 67 y.o. male, DOB - [birth year], HKV:[id number]  Admit date - 10/28/2017   Admitting Physician Kayleen Memos, DO  Outpatient Primary MD for the patient is Patient, [name]  LOS - 4  Chief Complaint  Patient presents with  . Chest Pain       Brief Narrative  Adam Savage a 67 y.o.malewith past medical history significant ofvalvular heart disease status post mechanical valve around 2002, dilated aorta, hypertension, hyperlipidemia, OSA, seizure status post temporal lobectomy, who presented to [facility] emergency department with chest pain. Here from out of town visiting his family and grandkids when he started complaining of substernal chest pain that lasted less than 2 hours. Patient reports pain radiated down his left arm. Pain relieved by nitroglycerin. No associated shortness of breath with diaphoretic. Does not have history of heartburn. No fevers no cough no shortness of breath. Pain is completely resolved now.  Admitted for chest pain rule out ACS.  Troponin have been trending up and peaked at 3.33.     Subjective:   Patient in bed, appears comfortable, denies any headache, no fever, no chest pain or pressure, no shortness of breath , no abdominal pain. No focal weakness.  Does have some diarrhea.   Assessment  & Plan :     1. NSTEMI - now pain free, post L.Heart Cath on 10/31/17 showing small vessel LAD lesion not stentable, Med Rx per Cards, on Hep/Coumadin, ASA, Coreg, statin.    2. Mech. Aortic Valve - stable, on Hep/Coumadin. Discussed with cardiology today due to his mechanical aortic valve they do not recommend Lovenox bridging, continue heparin and Coumadin bridge once INR is 2 can be  discharged home.  May require local cardiology referral as he is originally from [place] and has recently moved here.  3. Seizures - continue Keppra.  4. BPH - on flomax.  5. HTN - continue Coreg, ACE and monitor.  6. Depression - stable on Lexapro.  7. 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  8. COPD - at baseline.  9.  C. difficile negative diarrhea.  PRN Imodium.    10. DM2 - ISS  CBG (last 3)  Recent Labs  11/01/17 1734 11/01/17 2028 11/02/17 0753  GLUCAP 160* 157* 180*   Diet :   Diet Order           Diet Carb Modified Fluid consistency: Thin; Room service appropriate? Yes  Diet effective now           Family Communication  :  None present  Code Status :   Full  Disposition Plan  :, Discharge home once INR is therapeutic >2  Consults  :  Cards  Procedures  :    L. Heart Cath -   Conclusions:  Subtotal occlusion of the distal LAD, possibly thrombotic/embolic in origin. Otherwise, mild nonobstructive coronary artery disease.  Recommendations:  1. Medical therapy, given distal location and vessel size of distal LAD subtotal occlusion. This lesion is not amenable to PCI. 2. Restart heparin 2 hours after TR band removal. 3. Restart warfarin and continue aspirin 81 mg daily.  TTE  Left ventricle: The cavity size was normal. There was severehypertrophy of the basal septum with otherwise moderate concentric LVH. Systolic function was normal. The estimated ejection fraction was in the range of 55% to 60%. Wall motion wasnormal; there were no regional wall motion abnormalities. Dopplerparameters are consistent with abnormal left ventricularrelaxation (grade 1 diastolic dysfunction). Doppler parameters are consistent with indeterminate ventricular filling pressure. - Aortic valve: A bioprosthesis was present and functioning properly. Valve area (VTI): 2.74 cm^2. Valve area (Vmax): 2.72cm^2. Valve area (Vmean): 2.7 cm^2. - Mitral valve: Transvalvular  velocity was within the normal range.There was no evidence for stenosis. There was no regurgitation. - Left atrium: The atrium was moderately dilated. - Right ventricle: The cavity size was normal. Wall thickness wasnormal. Systolic function was normal. - Tricuspid valve: There was trivial regurgitation. - Pulmonary arteries: Systolic pressure was within the normalrange. PA peak pressure: 27 mm Hg (S).  CT - 1. No acute intracranial findings. 2. 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 3. 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   DVT Prophylaxis  :  Heparin/ Coumadin  Lab Results  Component Value Date   PLT 149 (L) 11/02/2017    Inpatient Medications  Scheduled Meds: . aspirin  81 mg Oral Daily  . atorvastatin  10 mg Oral q1800  . carvedilol  6.25 mg Oral BID WC  . escitalopram  20 mg Oral Daily  . Gerhardt's butt cream   Topical BID  . insulin aspart  0-5 Units Subcutaneous QHS  . insulin aspart  0-9 Units Subcutaneous TID WC  . levETIRAcetam  1,000 mg Oral BID  . ramipril  5 mg Oral Daily  . sodium chloride flush  3 mL Intravenous Q12H  . tamsulosin  0.4 mg Oral QPM  . Warfarin - Pharmacist Dosing Inpatient   Does not apply q1800   Continuous Infusions: . sodium chloride Stopped (10/31/17 2119)  . heparin 1,550 Units/hr (11/01/17 1902)   PRN Meds:.sodium chloride, acetaminophen, alum & mag hydroxide-simeth, loperamide, ondansetron (ZOFRAN) IV, sodium chloride flush, zolpidem  Antibiotics  :    Anti-infectives (From admission, onward)   None         Objective:   Vitals:   11/01/17 2024 11/02/17 0028 11/02/17 0448 11/02/17 0758  BP: (!) 103/43 101/68 113/61 119/65  Pulse: 61 61 61 (!) 57  Resp: 16 16 16 16   Temp: 98.3 F (36.8 C) 98.1 F (36.7 C) 97.6 F (36.4 C) 97.9 F (36.6 C)  TempSrc: Oral Oral Oral Oral  SpO2: 96% 97% 99% 98%  Weight:   117.6 kg (259 lb 4.8 oz)    Height:        Wt Readings from Last 3 Encounters:  11/02/17 117.6 kg (259 lb 4.8 oz)     Intake/Output Summary (Last 24 hours) at 11/02/2017 1043 Last data filed at 11/02/2017 0300 Gross per 24 hour  Intake 134.1 ml  Output 425 ml  Net -290.9 ml     Physical Exam  Awake Alert, Oriented X 3, No new F.N deficits, Normal affect .AT,PERRAL Supple Neck,No JVD, No cervical lymphadenopathy appriciated.  Symmetrical Chest wall movement, Good air movement bilaterally, CTAB RRR,No Gallops, Rubs or new Murmurs, No Parasternal Heave +ve B.Sounds, Abd Soft, No tenderness, No organomegaly appriciated, No rebound - guarding or rigidity. No Cyanosis, Clubbing or edema, No new Rash or bruise   Data Review:    CBC Recent Labs  Lab 10/28/17 1313 10/29/17 0254 10/30/17 0348 10/31/17 0459 11/01/17 0358 11/02/17 0441  WBC 9.9 10.4 10.0 9.3 10.2 10.1  HGB 13.7 12.9* 13.6 13.8 13.0 12.6*  HCT 40.5 38.7* 42.3 42.2 39.9 38.9*  PLT 145* 138* 141* 159 148* 149*  MCV 84.0 83.6 84.9 85.1 84.7 85.3  MCH 28.4 27.9 27.3 27.8 27.6 27.6  MCHC 33.8 33.3 32.2 32.7 32.6 32.4  RDW 16.1* 15.8* 16.0* 15.9* 15.9* 15.9*  LYMPHSABS 1.1  --   --   --   --   --   MONOABS 0.7  --   --   --   --   --   EOSABS 0.1  --   --   --   --   --   BASOSABS 0.0  --   --   --   --   --     Chemistries  Recent Labs  Lab 10/29/17 1714 10/30/17 0348 10/31/17 0820 11/01/17 0358 11/02/17 0441  NA 136 137 134* 136 136  K 4.0 4.0 4.4 3.9 4.3  CL 100 104 104 105 105  CO2 28 27 20* 24 24  GLUCOSE 183* 150* 137* 156* 127*  BUN 16 19 23 23 23   CREATININE 1.06 1.07 1.06 1.22 1.12  CALCIUM 8.6* 8.5* 8.1* 8.3* 8.3*  MG 2.0  --   --   --   --    ------------------------------------------------------------------------------------------------------------------ Recent Labs    11/02/17 0441  CHOL 110  HDL 36*  LDLCALC 60  TRIG 69  CHOLHDL 3.1    Lab Results  Component Value Date   HGBA1C 8.6 (H) 10/28/2017    ------------------------------------------------------------------------------------------------------------------ No results for input(s): TSH, T4TOTAL, T3FREE, THYROIDAB in the last 72 hours.  Invalid input(s): FREET3 ------------------------------------------------------------------------------------------------------------------ No results for input(s): VITAMINB12, FOLATE, FERRITIN, TIBC, IRON, RETICCTPCT in the last 72 hours.  Coagulation profile Recent Labs  Lab 10/29/17 0254 10/30/17 0348 10/31/17 0459 11/01/17 0358 11/02/17 0441  INR 1.47 1.65 1.46 1.50 1.72    No results for input(s): DDIMER in the last 72 hours.  Cardiac Enzymes Recent Labs  Lab 10/28/17 2054 10/28/17 2256 10/29/17 0254  TROPONINI 0.63* 1.79* 3.33*   ------------------------------------------------------------------------------------------------------------------ No results found for: BNP  Micro Results Recent Results (from the past 240 hour(s))  MRSA PCR Screening     Status: None   Collection Time: 10/29/17  8:49 AM  Result Value Ref Range Status   MRSA by PCR NEGATIVE NEGATIVE Final    Comment:        The GeneXpert MRSA Assay (FDA approved for NASAL specimens only), is one component  of a comprehensive MRSA colonization surveillance program. It is not intended to diagnose MRSA infection nor to guide or monitor treatment for MRSA infections. Performed at [facility], [place] [address], [place], [place] [address]   C difficile quick scan w PCR reflex     Status: None   Collection Time: 10/29/17  5:03 PM  Result Value Ref Range Status   C Diff antigen NEGATIVE NEGATIVE Final   C Diff toxin NEGATIVE NEGATIVE Final   C Diff interpretation No C. difficile detected.  Final    Comment: Performed at [facility], [place] [address], [place], [place] [address]    Radiology Reports Dg Chest 2 View  Result Date: 10/28/2017 CLINICAL DATA:  Chest pressure EXAM: CHEST - 2 VIEW  COMPARISON:  None. FINDINGS: Lungs are clear. The heart size and pulmonary vascularity are normal. No adenopathy. There is aortic atherosclerosis. Patient is status post aortic valve replacement. There is evidence of old healed trauma involving the lateral left clavicle. IMPRESSION: No edema or consolidation. Aortic atherosclerosis. Status post aortic valve replacement. Aortic Atherosclerosis (ICD10-I70.0). Electronically Signed   By: Lowella Grip III M.D.   On: 10/28/2017 13:33   Ct Head Wo Contrast  Result Date: 10/28/2017 CLINICAL DATA:  Left chest pressure radiating down the left arm EXAM: CT HEAD WITHOUT CONTRAST TECHNIQUE: Contiguous axial images were obtained from the base of the skull through the vertex without intravenous contrast. COMPARISON:  None. FINDINGS: Despite efforts by the technologist and patient, motion artifact is present on today's exam and could not be eliminated. This reduces exam sensitivity and specificity. Brain: Prominent encephalomalacia in the right temporal lobe. This has a chronic appearance. Overlying right frontotemporoparietal craniotomy. Otherwise, the brainstem, cerebellum, cerebral peduncles, thalami, basal ganglia, basilar cisterns, and ventricular system appear within normal limits. No intracranial hemorrhage, mass lesion, or acute CVA. Vascular: There is atherosclerotic calcification of the cavernous carotid arteries bilaterally. Skull: Right craniotomy as noted above. 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 Other: No supplemental non-categorized findings. IMPRESSION: 1. No acute intracranial findings. 2. 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 3. 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 Electronically Signed   By: Van Clines M.D.   On: 10/28/2017 19:02    Time Spent in minutes  30   Lala Lund M.D on 11/02/2017 at 10:43 AM  To  page go to [email or web address] - password [facility]

## 2017-11-02 NOTE — Progress Notes (Addendum)
Pt pulled IV and walking on a hallway with his back uncovered.  Stated he was trying to find a help.  IV Heparin has been on hold since 1606. Heparin was restarted at 1520.  Idolina Primer, RN

## 2017-11-03 DIAGNOSIS — J449 Chronic obstructive pulmonary disease, unspecified: Secondary | ICD-10-CM

## 2017-11-03 DIAGNOSIS — R079 Chest pain, unspecified: Secondary | ICD-10-CM

## 2017-11-03 DIAGNOSIS — R569 Unspecified convulsions: Secondary | ICD-10-CM

## 2017-11-03 LAB — CBC
HCT: 40 % (ref 39.0–52.0)
Hemoglobin: 12.9 g/dL — ABNORMAL LOW (ref 13.0–17.0)
MCH: 27.4 pg (ref 26.0–34.0)
MCHC: 32.3 g/dL (ref 30.0–36.0)
MCV: 84.9 fL (ref 78.0–100.0)
Platelets: 138 10*3/uL — ABNORMAL LOW (ref 150–400)
RBC: 4.71 MIL/uL (ref 4.22–5.81)
RDW: 15.9 % — ABNORMAL HIGH (ref 11.5–15.5)
WBC: 9.2 10*3/uL (ref 4.0–10.5)

## 2017-11-03 LAB — GLUCOSE, CAPILLARY
Glucose-Capillary: 146 mg/dL — ABNORMAL HIGH (ref 70–99)
Glucose-Capillary: 131 mg/dL — ABNORMAL HIGH (ref 70–99)
Glucose-Capillary: 147 mg/dL — ABNORMAL HIGH (ref 70–99)
Glucose-Capillary: 133 mg/dL — ABNORMAL HIGH (ref 70–99)
Glucose-Capillary: 170 mg/dL — ABNORMAL HIGH (ref 70–99)

## 2017-11-03 LAB — BASIC METABOLIC PANEL
Anion gap: 7 (ref 5–15)
BUN: 21 mg/dL (ref 8–23)
CO2: 23 mmol/L (ref 22–32)
Calcium: 8.4 mg/dL — ABNORMAL LOW (ref 8.9–10.3)
Chloride: 105 mmol/L (ref 98–111)
Creatinine, Ser: 1.08 mg/dL (ref 0.61–1.24)
GFR calc Af Amer: >60 mL/min (ref >60)
GFR calc non Af Amer: >60 mL/min (ref >60)
Glucose, Bld: 131 mg/dL — ABNORMAL HIGH (ref 70–99)
Potassium: 4.3 mmol/L (ref 3.5–5.1)
Sodium: 135 mmol/L (ref 135–145)

## 2017-11-03 LAB — HEPARIN LEVEL (UNFRACTIONATED): Heparin Unfractionated: 0.49 IU/mL (ref 0.30–0.70)

## 2017-11-03 LAB — PROTIME-INR
INR: 2.53
Prothrombin Time: 27.1 seconds — ABNORMAL HIGH (ref 11.4–15.2)

## 2017-11-03 MED ORDER — WARFARIN SODIUM 7.5 MG PO TABS
7.5000 mg | ORAL_TABLET | ORAL | Status: DC
  Administered 2017-11-04: 7.5 mg via ORAL
  Filled 2017-11-03: qty 1

## 2017-11-03 MED ORDER — WARFARIN SODIUM 5 MG PO TABS: 5.0000 mg | ORAL_TABLET | ORAL | Status: DC

## 2017-11-03 MED ORDER — WARFARIN SODIUM 1 MG PO TABS
3.0000 mg | ORAL_TABLET | Freq: Once | ORAL | Status: AC
  Administered 2017-11-03: 3 mg via ORAL
  Filled 2017-11-03: qty 1

## 2017-11-03 NOTE — Progress Notes (Signed)
[place] for Heparin  Indication: chest pain/ACS and mechanical aortic valve (2002)  No Known Allergies  Patient Measurements: Height: 6' (182.9 cm) Weight: 260 lb 8 oz (118.2 kg) IBW/kg (Calculated) : 77.6 HEPARIN DW (KG): 104.3  Vital Signs: Temp: 98.4 F (36.9 C) (07/22 0747) Temp Source: Oral (07/22 0747) BP: 121/41 (07/22 0747) Pulse Rate: 71 (07/22 0747)  Labs: Recent Labs    11/01/17 0358 11/01/17 1133 11/02/17 0441 11/03/17 0719  HGB 13.0  --  12.6* 12.9*  HCT 39.9  --  38.9* 40.0  PLT 148*  --  149* 138*  LABPROT 18.0*  --  20.0* 27.1*  INR 1.50  --  1.72 2.53  HEPARINUNFRC 0.35 0.55 0.46 0.49  CREATININE 1.22  --  1.12 1.08    Estimated Creatinine Clearance: 88.1 mL/min (by C-G formula based on SCr of 1.08 mg/dL).   Medical History: Past Medical History:  Diagnosis Date  . COPD (chronic obstructive pulmonary disease) ([place])   . Mechanical heart valve present   . Pre-diabetes   . Seizures ([facility])     Medications:  Scheduled:  . aspirin  81 mg Oral Daily  . atorvastatin  10 mg Oral q1800  . carvedilol  6.25 mg Oral BID WC  . escitalopram  20 mg Oral Daily  . Gerhardt's butt cream   Topical BID  . insulin aspart  0-5 Units Subcutaneous QHS  . insulin aspart  0-9 Units Subcutaneous TID WC  . levETIRAcetam  1,000 mg Oral BID  . ramipril  5 mg Oral Daily  . sodium chloride flush  3 mL Intravenous Q12H  . tamsulosin  0.4 mg Oral QPM  . Warfarin - Pharmacist Dosing Inpatient   Does not apply q1800   Infusions:  . sodium chloride Stopped (10/31/17 2119)    Assessment:  67 yo male with NSTEMI and positive troponins [facility] Of note, patient also has mechanical aortic heart valve on warfarin PTA 7.5 mg daily except 5 mg MWF. Pharmacy consulted to manage warfarin and heparin.  INR today has trended up significantly from 1.72 to 2.53 Heparin level therapeutic at 0.49 No s/sx of  bleeding, CBC stable.  Goal of Therapy:  INR 2-3 Monitor platelets by anticoagulation protocol: Yes   Plan:  Stop IV heparin since INR > 2  Would give a one-time lower dose of warfarin 3 mg today given up-trend in INR and resume on home dose starting tomorrow  Monitor daily INR   Thank you for involving pharmacy in this patient's care.  Albertina Parr, PharmD., BCPS Clinical Pharmacist Clinical phone for 11/03/17 until 3:30pm: [phone] If after 3:30pm, please refer to [facility] for unit-specific pharmacist

## 2017-11-03 NOTE — Progress Notes (Addendum)
PROGRESS NOTE    Adam Savage  ZES:[id number] DOB: [birth year] DOA: 10/28/2017 PCP: Patient, [name]    Brief Narrative:  67 year old male who presented with chest pain.  He does have significant past medical history for mechanical aortic valve replacement, seizure disorder, status post partial lobectomy, and COPD.  He reported acute onset of substernal chest pain for 2 consecutive hours, radiated down to his left arm, nitroglycerin no associated dyspnea or diaphoresis.  On the initial physical examination blood pressure 141/72, heart rate 91, respiratory rate 19, oxygen saturation 97%.  His lungs are clear to auscultation bilaterally, heart S1-S2 present rhythmic, abdomen soft nontender, no lower extremity edema.  Sodium 130, potassium 4.2, chloride 98, bicarb 24, glucose 395, BUN 20, creatinine 1.0, troponin less than 0.03, white count 9.9, hemoglobin 13.7, hematocrit 40.5, platelets 145.  Urinalysis greater than 500 glucose, specific gravity 1.031, RBCs 20-50, white cells 0-5 negative protein.  Head CT negative for acute changes, right parietal craniotomy with chronic encephalomalacia especially involving the right temporal lobe.  Chest x-ray with negative for infiltrates, sternotomy wires in place.  Patient was admitted to the hospital with working diagnosis of atypical chest pain to rule out acute coronary syndrome.   Assessment & Plan:   Principal Problem:   Chest pain Active Problems:   Seizures (HCC)   COPD (chronic obstructive pulmonary disease) (HCC)   Mechanical heart valve present   Pre-diabetes   Unstable angina (HCC)   Non-ST elevation (NSTEMI) myocardial infarction (HCC)   Anticoagulant long-term use   1.  Non-ST elevation myocardial infarction.  Subtotal occlusion of distal LAD/possibly thromboembolic. Coronary lesions not amenable to PCI, will continue aggressive medical therapy with aspirin, carvedilol ramipril and statin therapy.   2. Mechanical aortic valve with  subtherapeutic INR. Will continue heparin bridge and oral warfarin, INR today at 2.5., at target, heparin has been stoped.  3. HTN. Continue blood pressure control with carvedilol and ramipril  4. COPD. With no clinical exacerbation, continue bronchodilator therapy as needed  5. Chronic diastolic heart failure. Clinically euvolemic, with no signs of clinical exacerbation, will continue blood pressure control.   6. T2DM. Will continue glucose cover and monitoring with insulin sliding scale.   7. Seizures. Continue Keppra.    DVT prophylaxis: heparin and warfarin   Code Status:  full Family Communication: no family at the bedside  Disposition Plan/ discharge barriers: pending INR therapeutic for 48 before stoping heparin    Consultants:   Cardiology   Procedures:     Antimicrobials:       Subjective: Patient is feeling well, no further chest pain, no nausea or vomiting. Has ben out of bed.   Objective: Vitals:   11/02/17 2045 11/03/17 0020 11/03/17 0553 11/03/17 0747  BP: (!) 97/38 (!) 93/47 (!) 100/47 (!) 121/41  Pulse: (!) 58 (!) 57  71  Resp: 16 16 18    Temp: 97.7 F (36.5 C) 97.8 F (36.6 C) 98 F (36.7 C) 98.4 F (36.9 C)  TempSrc: Oral Oral Oral Oral  SpO2: 98% 99% 97% 99%  Weight:   118.2 kg (260 lb 8 oz)   Height:        Intake/Output Summary (Last 24 hours) at 11/03/2017 0845 Last data filed at 11/03/2017 0400 Gross per 24 hour  Intake 1203.62 ml  Output 350 ml  Net 853.62 ml   Filed Weights   11/01/17 0403 11/02/17 0448 11/03/17 0553  Weight: 118.4 kg (261 lb) 117.6 kg (259 lb 4.8 oz) 118.2  kg (260 lb 8 oz)    Examination:   General: Not in pain or dyspnea,  Neurology: Awake and alert, non focal  E ENT: no pallor, no icterus, oral mucosa moist Cardiovascular: No JVD. S1-S2 present, S2 mechanical click, rhythmic, no gallops, rubs, or murmurs. No lower extremity edema. Pulmonary: vesicular breath sounds bilaterally, adequate air movement, no  wheezing, rhonchi or rales. Gastrointestinal. Abdomen with no organomegaly, non tender, no rebound or guarding Skin. No rashes Musculoskeletal: no joint deformities     Data Reviewed: I have personally reviewed following labs and imaging studies  CBC: Recent Labs  Lab 10/28/17 1313  10/30/17 0348 10/31/17 0459 11/01/17 0358 11/02/17 0441 11/03/17 0719  WBC 9.9   < > 10.0 9.3 10.2 10.1 9.2  NEUTROABS 8.0*  --   --   --   --   --   --   HGB 13.7   < > 13.6 13.8 13.0 12.6* 12.9*  HCT 40.5   < > 42.3 42.2 39.9 38.9* 40.0  MCV 84.0   < > 84.9 85.1 84.7 85.3 84.9  PLT 145*   < > 141* 159 148* 149* 138*   < > = values in this interval not displayed.   Basic Metabolic Panel: Recent Labs  Lab 10/29/17 1714 10/30/17 0348 10/31/17 0820 11/01/17 0358 11/02/17 0441 11/03/17 0719  NA 136 137 134* 136 136 135  K 4.0 4.0 4.4 3.9 4.3 4.3  CL 100 104 104 105 105 105  CO2 28 27 20* 24 24 23   GLUCOSE 183* 150* 137* 156* 127* 131*  BUN 16 19 23 23 23 21   CREATININE 1.06 1.07 1.06 1.22 1.12 1.08  CALCIUM 8.6* 8.5* 8.1* 8.3* 8.3* 8.4*  MG 2.0  --   --   --   --   --    GFR: Estimated Creatinine Clearance: 88.1 mL/min (by C-G formula based on SCr of 1.08 mg/dL). Liver Function Tests: No results for input(s): AST, ALT, ALKPHOS, BILITOT, PROT, ALBUMIN in the last 168 hours. No results for input(s): LIPASE, AMYLASE in the last 168 hours. No results for input(s): AMMONIA in the last 168 hours. Coagulation Profile: Recent Labs  Lab 10/30/17 0348 10/31/17 0459 11/01/17 0358 11/02/17 0441 11/03/17 0719  INR 1.65 1.46 1.50 1.72 2.53   Cardiac Enzymes: Recent Labs  Lab 10/28/17 1313 10/28/17 1605 10/28/17 2054 10/28/17 2256 10/29/17 0254  TROPONINI <0.03 0.04* 0.63* 1.79* 3.33*   BNP (last 3 results) No results for input(s): PROBNP in the last 8760 hours. HbA1C: Recent Labs    11/02/17 0441  HGBA1C 8.3*   CBG: Recent Labs  Lab 11/02/17 0753 11/02/17 1216  11/02/17 1715 11/02/17 2046 11/03/17 0745  GLUCAP 180* 157* 144* 133* 131*   Lipid Profile: Recent Labs    11/02/17 0441  CHOL 110  HDL 36*  LDLCALC 60  TRIG 69  CHOLHDL 3.1   Thyroid Function Tests: No results for input(s): TSH, T4TOTAL, FREET4, T3FREE, THYROIDAB in the last 72 hours. Anemia Panel: No results for input(s): VITAMINB12, FOLATE, FERRITIN, TIBC, IRON, RETICCTPCT in the last 72 hours.    Radiology Studies: I have reviewed all of the imaging during this hospital visit personally     Scheduled Meds: . aspirin  81 mg Oral Daily  . atorvastatin  10 mg Oral q1800  . carvedilol  6.25 mg Oral BID WC  . escitalopram  20 mg Oral Daily  . Gerhardt's butt cream   Topical BID  . insulin aspart  0-5 Units Subcutaneous QHS  . insulin aspart  0-9 Units Subcutaneous TID WC  . levETIRAcetam  1,000 mg Oral BID  . ramipril  5 mg Oral Daily  . sodium chloride flush  3 mL Intravenous Q12H  . tamsulosin  0.4 mg Oral QPM  . Warfarin - Pharmacist Dosing Inpatient   Does not apply q1800   Continuous Infusions: . sodium chloride Stopped (10/31/17 2119)  . heparin 1,550 Units/hr (11/03/17 0400)     LOS: 5 days        Derreck Wiltsey Gerome Apley, MD [facility] Pager [phone]

## 2017-11-03 NOTE — Progress Notes (Signed)
Physical Therapy Discharge Patient Details Name: Adam Savage MRN: [id number] DOB: 18-Nov-1950 Today's Date: 11/03/2017 Time: 5366-4403 PT Time Calculation (min) (ACUTE ONLY): 11 min  Patient discharged from PT services secondary to goals met and no further PT needs identified.  Please see latest therapy progress note for current level of functioning and progress toward goals.    Progress and discharge plan discussed with patient and/or caregiver: Patient/Caregiver agrees with plan  GP     [name] 11/03/2017, 1:47 PM  [facility] Acute Rehabilitation [phone] (pager) [place] [phone] (pager)

## 2017-11-03 NOTE — Progress Notes (Signed)
Physical Therapy Treatment and D/C Patient Details Name: Adam Savage MRN: [id number] DOB: March 31, 1951 Today's Date: 11/03/2017    History of Present Illness 67 y.o. male admitted for CP, found to have STEMI with elevated troponin. PMH includes: Seizures, COPD, mechanical heart valve L TKA. Plan for cardiac cath for further workup 7/19.     PT Comments    Pt admitted with above diagnosis. Pt currently without significant  functional limitations and has met goals.  No longer needs skilled PT. Will sign off.      Follow Up Recommendations  No PT follow up(OP CARDIAC REHAB )     Equipment Recommendations  None recommended by PT    Recommendations for Other Services       Precautions / Restrictions Precautions Precautions: Fall Restrictions Weight Bearing Restrictions: No    Mobility  Bed Mobility Overal bed mobility: Modified Independent                Transfers Overall transfer level: Modified independent Equipment used: None                Ambulation/Gait Ambulation/Gait assistance: Independent Gait Distance (Feet): 500 Feet Assistive device: None Gait Pattern/deviations: Step-through pattern   Gait velocity interpretation: >2.62 ft/sec, indicative of community ambulatory General Gait Details: ambulating in hallway without assist and without LOB.  VSS on RA.   Stairs Stairs: Yes Stairs assistance: Supervision;Min guard Stair Management: One rail Left;Forwards;Alternating pattern Number of Stairs: 8 General stair comments: Able to ascend and descend steps without physical assist and no cues.    Wheelchair Mobility    Modified Rankin (Stroke Patients Only)       Balance Overall balance assessment: Needs assistance         Standing balance support: No upper extremity supported;During functional activity Standing balance-Leahy Scale: Good Standing balance comment: can withstand min challenges to balance.                              Cognition Arousal/Alertness: Awake/alert Behavior During Therapy: WFL for tasks assessed/performed Overall Cognitive Status: Within Functional Limits for tasks assessed                                        Exercises      General Comments        Pertinent Vitals/Pain Pain Assessment: No/denies pain    Home Living                      Prior Function            PT Goals (current goals can now be found in the care plan section) Acute Rehab PT Goals PT Goal Formulation: All assessment and education complete, DC therapy Progress towards PT goals: Progressing toward goals    Frequency    Min 3X/week      PT Plan Current plan remains appropriate    Co-evaluation              AM-PAC PT "6 Clicks" Daily Activity  Outcome Measure  Difficulty turning over in bed (including adjusting bedclothes, sheets and blankets)?: None Difficulty moving from lying on back to sitting on the side of the bed? : None Difficulty sitting down on and standing up from a chair with arms (e.g., wheelchair, bedside commode, etc,.)?: None Help needed moving to and  from a bed to chair (including a wheelchair)?: None Help needed walking in hospital room?: None Help needed climbing 3-5 steps with a railing? : None 6 Click Score: 24    End of Session Equipment Utilized During Treatment: Gait belt Activity Tolerance: Patient tolerated treatment well Patient left: in bed;with call bell/phone within reach Nurse Communication: Mobility status PT Visit Diagnosis: Difficulty in walking, not elsewhere classified (R26.2)     Time: 7737-5051 PT Time Calculation (min) (ACUTE ONLY): 11 min  Charges:  $Gait Training: 8-22 mins                    G Codes:       [place] [phone] (pager)    [name] 11/03/2017, 1:46 PM

## 2017-11-04 LAB — GLUCOSE, CAPILLARY
Glucose-Capillary: 129 mg/dL — ABNORMAL HIGH (ref 70–99)
Glucose-Capillary: 108 mg/dL — ABNORMAL HIGH (ref 70–99)
Glucose-Capillary: 144 mg/dL — ABNORMAL HIGH (ref 70–99)

## 2017-11-04 LAB — PROTIME-INR
INR: 2.47
Prothrombin Time: 26.5 seconds — ABNORMAL HIGH (ref 11.4–15.2)

## 2017-11-04 MED ORDER — ATORVASTATIN CALCIUM 10 MG PO TABS: 10.0000 mg | ORAL_TABLET | Freq: Every day | ORAL | 0 refills | Status: DC

## 2017-11-04 MED ORDER — ASPIRIN 81 MG PO CHEW: 81.0000 mg | CHEWABLE_TABLET | Freq: Every day | ORAL | 0 refills | Status: AC

## 2017-11-04 MED ORDER — BLOOD GLUCOSE METER KIT: PACK | 0 refills | Status: DC

## 2017-11-04 MED ORDER — CARVEDILOL 6.25 MG PO TABS: 6.2500 mg | ORAL_TABLET | Freq: Two times a day (BID) | ORAL | 0 refills | Status: DC

## 2017-11-04 NOTE — Care Management Note (Signed)
Case Management Note  Patient Details  Name: Adam Savage MRN: [id number] Date of Birth: [birth year]  Subjective/Objective:  Pt presented for Chest Pain- Originally from [place]- visiting daughter. Pt states he has a PCP in [place].                   Action/Plan: CM did provide pt with rhe Health Connect Number to call to get a physician in network with his insurance. No further needs identified at this time.   Expected Discharge Date:  11/04/17               Expected Discharge Plan:  Home/Self Care  In-House Referral:  PCP / Health Connect  Discharge planning Services  CM Consult  Post Acute Care Choice:  NA Choice offered to:  NA  DME Arranged:  N/A DME Agency:  NA  HH Arranged:  NA HH Agency:  NA  Status of Service:  Completed, signed off  If discussed at [place] of Stay Meetings, dates discussed:    Additional Comments:  Bethena Roys, RN 11/04/2017, 1:54 PM

## 2017-11-04 NOTE — Discharge Summary (Signed)
Physician Discharge Summary  Adam Savage TSV:[id number] DOB: 03-28-1951 DOA: 10/28/2017  PCP: Patient, [name]  Admit date: 10/28/2017 Discharge date: 11/04/2017  Admitted From: Home  Disposition:  Home   Recommendations for Outpatient Follow-up and new medication changes:   1. Please have INR check in 48 hours with the warfarin clinic.  2. Aspirin has been added to the medical regimen.  3. Carvedilol has been increased to 6,245 mg po bid.  4. Follow with Dr Irish Lack in 7 days.   Home Health: no   Equipment/Devices: no   Discharge Condition: stable  CODE STATUS: full  Diet recommendation: Heart healthy.   Brief/Interim Summary: 67 year old male who presented with chest pain.  He does have a significant past medical history for mechanical aortic valve replacement, seizure disorder, status post partial lobectomy, and COPD.  He reported acute onset of substernal chest pain for 2 consecutive hours, radiated down to his left arm, relived by nitroglycerin but no associated dyspnea or diaphoresis.  On the initial physical examination blood pressure 141/72, heart rate 91, respiratory rate 19, oxygen saturation 97%.  His lungs were clear to auscultation bilaterally, heart S1-S2 present rhythmic, abdomen soft nontender, no lower extremity edema.  Sodium 130, potassium 4.2, chloride 98, bicarb 24, glucose 395, BUN 20, creatinine 1.0, troponin less than 0.03, white count 9.9, hemoglobin 13.7, hematocrit 40.5, platelets 145. INR 1.17 Urinalysis greater than 500 glucose, specific gravity 1.031, RBCs 20-50, white cells 0-5 negative protein.  Head CT negative for acute changes, right parietal craniotomy with chronic encephalomalacia especially involving the right temporal lobe.  Chest x-ray with negative for infiltrates, sternotomy wires in place.  Initial EKG with normal sinus rhythm, left axis deviation, no ST elevations or ST depressions no significant abnormalities.  Patient was admitted to the  hospital with working diagnosis of atypical chest pain to rule out acute coronary syndrome.  1.  Non-ST elevation myocardial infarction, subtotal occlusion of the distal LDA, possibly thromboembolic event.  Patient was admitted to the telemetry unit, his cardiac enzymes turned positive with a peak troponin I 3.33, his electrocardiogram showed new precordial T wave inversions.  Patient was placed on IV heparin, aspirin, carvedilol, ramipril and atorvastatin.  Underwent cardiac catheterization finding subtotal occlusion of distal LAD, possible embolism, otherwise nonobstructive coronary artery disease, normal motion of bileaflet mechanical aortic valve.  Further work-up with echocardiography showed a normal LV systolic function 55 to 92%, severe hypertrophy of the basal septum.  No wall motion abnormalities.  Aortic valve was functioning properly. It was recommended to add aspirin to his medical regimen.  Have close follow-up on pro time INR, keep at goal 2-3.  Patient will follow-up with Dr. Irish Lack.   2.  Mechanical aortic valve subtherapeutic INR.  Patient was placed on heparin for anticoagulation bridging, warfarin was resumed per pharmacy protocol, is INR reached therapeutic levels up to 2.5 at discharge.  Patient will need close follow-up as an outpatient.  3.  Hypertension.  Continue blood pressure control with carvedilol and ramipril.  4.  Chronic diastolic heart failure.  Remains stable with no signs of exacerbation, continue carvedilol and ramipril.  5.  COPD.  Remained stable with no signs of exacerbation.  6.  Type 2 diabetes mellitus.  Patient was placed on insulin sliding scale for glucose coverage and monitoring, capillary glucose remained well controlled. Continue diet control at discharge.   7.  Seizures.  Stable with no decompensation continue Keppra.   Discharge Diagnoses:  Principal Problem:  Chest pain Active Problems:   Seizures (HCC)   COPD (chronic obstructive pulmonary  disease) (HCC)   Mechanical heart valve present   Pre-diabetes   Unstable angina (HCC)   Non-ST elevation (NSTEMI) myocardial infarction [facility])   Anticoagulant long-term use    Discharge Instructions   Allergies as of 11/04/2017   No Known Allergies     Medication List    TAKE these medications   aspirin 81 MG chewable tablet Chew 1 tablet (81 mg total) by mouth daily.   atorvastatin 10 MG tablet Commonly known as:  LIPITOR Take 1 tablet by mouth daily.   carvedilol 6.25 MG tablet Commonly known as:  COREG Take 1 tablet (6.25 mg total) by mouth 2 (two) times daily with a meal. What changed:    medication strength  how much to take  when to take this   escitalopram 20 MG tablet Commonly known as:  LEXAPRO Take 1 tablet by mouth daily.   levETIRAcetam 1000 MG tablet Commonly known as:  KEPPRA Take 1 tablet by mouth 2 (two) times daily.   ramipril 5 MG capsule Commonly known as:  ALTACE Take 1 capsule by mouth daily.   ranitidine 150 MG tablet Commonly known as:  ZANTAC Take 1 tablet by mouth 2 (two) times daily.   tamsulosin 0.4 MG Caps capsule Commonly known as:  FLOMAX Take 1 capsule by mouth every evening.   warfarin 7.5 MG tablet Commonly known as:  COUMADIN Take 7.5 mg by mouth daily. Every Sunday, Tuesday, Thursday and Saturday   warfarin 5 MG tablet Commonly known as:  COUMADIN Take 1 tablet by mouth daily. Every Monday, Wednesday and Friday       No Known Allergies  Consultations:  Cardiology    Procedures/Studies: Dg Chest 2 View  Result Date: 10/28/2017 CLINICAL DATA:  Chest pressure EXAM: CHEST - 2 VIEW COMPARISON:  None. FINDINGS: Lungs are clear. The heart size and pulmonary vascularity are normal. No adenopathy. There is aortic atherosclerosis. Patient is status post aortic valve replacement. There is evidence of old healed trauma involving the lateral left clavicle. IMPRESSION: No edema or consolidation. Aortic atherosclerosis.  Status post aortic valve replacement. Aortic Atherosclerosis (ICD10-I70.0). Electronically Signed   By: Lowella Grip III M.D.   On: 10/28/2017 13:33   Ct Head Wo Contrast  Result Date: 10/28/2017 CLINICAL DATA:  Left chest pressure radiating down the left arm EXAM: CT HEAD WITHOUT CONTRAST TECHNIQUE: Contiguous axial images were obtained from the base of the skull through the vertex without intravenous contrast. COMPARISON:  None. FINDINGS: Despite efforts by the technologist and patient, motion artifact is present on today's exam and could not be eliminated. This reduces exam sensitivity and specificity. Brain: Prominent encephalomalacia in the right temporal lobe. This has a chronic appearance. Overlying right frontotemporoparietal craniotomy. Otherwise, the brainstem, cerebellum, cerebral peduncles, thalami, basal ganglia, basilar cisterns, and ventricular system appear within normal limits. No intracranial hemorrhage, mass lesion, or acute CVA. Vascular: There is atherosclerotic calcification of the cavernous carotid arteries bilaterally. Skull: Right craniotomy as noted above. 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 Other: No supplemental non-categorized findings. IMPRESSION: 1. No acute intracranial findings. 2. 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 3. 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 Electronically Signed   By: Van Clines M.D.   On: 10/28/2017 19:02       Subjective: Patient is feeling well, no chest pain or dyspnea, no nausea  or vomiting.   Discharge Exam: Vitals:   11/03/17 2040 11/04/17 0454  BP: (!) 95/51 (!) 99/48  Pulse: (!) 59 67  Resp: 16 16  Temp: 97.8 F (36.6 C) 98.1 F (36.7 C)  SpO2: 98% 97%   Vitals:   11/03/17 1636 11/03/17 2040 11/04/17 0400 11/04/17 0454  BP: (!) 111/35 (!) 95/51  (!) 99/48  Pulse: (!) 54 (!) 59  67  Resp:  16   16  Temp:  97.8 F (36.6 C)  98.1 F (36.7 C)  TempSrc:  Oral  Oral  SpO2: 99% 98%  97%  Weight:   119.1 kg (262 lb 8 oz)   Height:        General: Not in pain or dyspnea, Neurology: Awake and alert, non focal  E ENT: no pallor, no icterus, oral mucosa moist Cardiovascular: No JVD. U2-G2 (mechanical click) present, rhythmic, no gallops, rubs, or murmurs. No lower extremity edema. Pulmonary: vesicular breath sounds bilaterally, adequate air movement, no wheezing, rhonchi or rales. Gastrointestinal. Abdomen with no organomegaly, non tender, no rebound or guarding Skin. No rashes Musculoskeletal: no joint deformities   The results of significant diagnostics from this hospitalization (including imaging, microbiology, ancillary and laboratory) are listed below for reference.     Microbiology: Recent Results (from the past 240 hour(s))  MRSA PCR Screening     Status: None   Collection Time: 10/29/17  8:49 AM  Result Value Ref Range Status   MRSA by PCR NEGATIVE NEGATIVE Final    Comment:        The GeneXpert MRSA Assay (FDA approved for NASAL specimens only), is one component of a comprehensive MRSA colonization surveillance program. It is not intended to diagnose MRSA infection nor to guide or monitor treatment for MRSA infections. Performed at [facility], [place] [address] [place], [place] [address]   C difficile quick scan w PCR reflex     Status: None   Collection Time: 10/29/17  5:03 PM  Result Value Ref Range Status   C Diff antigen NEGATIVE NEGATIVE Final   C Diff toxin NEGATIVE NEGATIVE Final   C Diff interpretation No C. difficile detected.  Final    Comment: Performed at [facility], [place] [address], [place], [place] [address]     Labs: BNP (last 3 results) No results for input(s): BNP in the last 8760 hours. Basic Metabolic Panel: Recent Labs  Lab 10/29/17 1714 10/30/17 0348 10/31/17 0820 11/01/17 0358 11/02/17 0441 11/03/17 0719  NA 136  137 134* 136 136 135  K 4.0 4.0 4.4 3.9 4.3 4.3  CL 100 104 104 105 105 105  CO2 28 27 20* 24 24 23   GLUCOSE 183* 150* 137* 156* 127* 131*  BUN 16 19 23 23 23 21   CREATININE 1.06 1.07 1.06 1.22 1.12 1.08  CALCIUM 8.6* 8.5* 8.1* 8.3* 8.3* 8.4*  MG 2.0  --   --   --   --   --    Liver Function Tests: No results for input(s): AST, ALT, ALKPHOS, BILITOT, PROT, ALBUMIN in the last 168 hours. No results for input(s): LIPASE, AMYLASE in the last 168 hours. No results for input(s): AMMONIA in the last 168 hours. CBC: Recent Labs  Lab 10/28/17 1313  10/30/17 0348 10/31/17 0459 11/01/17 0358 11/02/17 0441 11/03/17 0719  WBC 9.9   < > 10.0 9.3 10.2 10.1 9.2  NEUTROABS 8.0*  --   --   --   --   --   --  HGB 13.7   < > 13.6 13.8 13.0 12.6* 12.9*  HCT 40.5   < > 42.3 42.2 39.9 38.9* 40.0  MCV 84.0   < > 84.9 85.1 84.7 85.3 84.9  PLT 145*   < > 141* 159 148* 149* 138*   < > = values in this interval not displayed.   Cardiac Enzymes: Recent Labs  Lab 10/28/17 1313 10/28/17 1605 10/28/17 2054 10/28/17 2256 10/29/17 0254  TROPONINI <0.03 0.04* 0.63* 1.79* 3.33*   BNP: Invalid input(s): POCBNP CBG: Recent Labs  Lab 11/03/17 0745 11/03/17 1147 11/03/17 1636 11/03/17 2041 11/04/17 0335  GLUCAP 131* 147* 133* 170* 129*   D-Dimer No results for input(s): DDIMER in the last 72 hours. Hgb A1c Recent Labs    11/02/17 0441  HGBA1C 8.3*   Lipid Profile Recent Labs    11/02/17 0441  CHOL 110  HDL 36*  LDLCALC 60  TRIG 69  CHOLHDL 3.1   Thyroid function studies No results for input(s): TSH, T4TOTAL, T3FREE, THYROIDAB in the last 72 hours.  Invalid input(s): FREET3 Anemia work up No results for input(s): VITAMINB12, FOLATE, FERRITIN, TIBC, IRON, RETICCTPCT in the last 72 hours. Urinalysis    Component Value Date/Time   COLORURINE YELLOW 10/28/2017 1530   APPEARANCEUR CLEAR 10/28/2017 1530   LABSPEC 1.031 (H) 10/28/2017 1530   PHURINE 6.0 10/28/2017 1530    GLUCOSEU >=500 (A) 10/28/2017 1530   HGBUR LARGE (A) 10/28/2017 1530   BILIRUBINUR NEGATIVE 10/28/2017 1530   KETONESUR NEGATIVE 10/28/2017 1530   PROTEINUR NEGATIVE 10/28/2017 1530   NITRITE NEGATIVE 10/28/2017 1530   LEUKOCYTESUR NEGATIVE 10/28/2017 1530   Sepsis Labs Invalid input(s): PROCALCITONIN,  WBC,  LACTICIDVEN Microbiology Recent Results (from the past 240 hour(s))  MRSA PCR Screening     Status: None   Collection Time: 10/29/17  8:49 AM  Result Value Ref Range Status   MRSA by PCR NEGATIVE NEGATIVE Final    Comment:        The GeneXpert MRSA Assay (FDA approved for NASAL specimens only), is one component of a comprehensive MRSA colonization surveillance program. It is not intended to diagnose MRSA infection nor to guide or monitor treatment for MRSA infections. Performed at [facility], [place] [address], [place], [place] [address]   C difficile quick scan w PCR reflex     Status: None   Collection Time: 10/29/17  5:03 PM  Result Value Ref Range Status   C Diff antigen NEGATIVE NEGATIVE Final   C Diff toxin NEGATIVE NEGATIVE Final   C Diff interpretation No C. difficile detected.  Final    Comment: Performed at [facility], [place] [address], [place], [place] [address]     Time coordinating discharge: 45 minutes  SIGNED:   Tawni Millers, MD  [facility] 11/04/2017, 8:19 AM Pager [phone]  If 7PM-7AM, please contact night-coverage [email or web address] Password [email or web address]

## 2017-11-04 NOTE — Progress Notes (Signed)
[place] for warfarin Indication: chest pain/ACS and mechanical aortic valve (2002)  No Known Allergies  Patient Measurements: Height: 6' (182.9 cm) Weight: 262 lb 8 oz (119.1 kg) IBW/kg (Calculated) : 77.6 HEPARIN DW (KG): 104.3  Vital Signs: Temp: 98.2 F (36.8 C) (07/23 1302) Temp Source: Oral (07/23 1302) BP: 113/51 (07/23 1302) Pulse Rate: 85 (07/23 1302)  Labs: Recent Labs    11/02/17 0441 11/03/17 0719 11/04/17 1219  HGB 12.6* 12.9*  --   HCT 38.9* 40.0  --   PLT 149* 138*  --   LABPROT 20.0* 27.1* 26.5*  INR 1.72 2.53 2.47  HEPARINUNFRC 0.46 0.49  --   CREATININE 1.12 1.08  --     Estimated Creatinine Clearance: 88.4 mL/min (by C-G formula based on SCr of 1.08 mg/dL).   Medical History: Past Medical History:  Diagnosis Date  . COPD (chronic obstructive pulmonary disease) ([place])   . Mechanical heart valve present   . Pre-diabetes   . Seizures (HCC)     Medications:  Scheduled:  . aspirin  81 mg Oral Daily  . atorvastatin  10 mg Oral q1800  . carvedilol  6.25 mg Oral BID WC  . escitalopram  20 mg Oral Daily  . Gerhardt's butt cream   Topical BID  . insulin aspart  0-5 Units Subcutaneous QHS  . insulin aspart  0-9 Units Subcutaneous TID WC  . levETIRAcetam  1,000 mg Oral BID  . ramipril  5 mg Oral Daily  . sodium chloride flush  3 mL Intravenous Q12H  . tamsulosin  0.4 mg Oral QPM  . [START ON 11/05/2017] warfarin  5 mg Oral Once per day on Mon Wed Fri  . warfarin  7.5 mg Oral Once per day on Sun Tue Thu Sat  . Warfarin - Pharmacist Dosing Inpatient   Does not apply q1800   Infusions:  . sodium chloride Stopped (10/31/17 2119)    Assessment:  66 yo male with NSTEMI and positive troponins prompting transfer from [facility] to [facility]. Of note, patient also has mechanical aortic heart valve on warfarin PTA 7.5 mg daily except 5 mg MWF. Pharmacy consulted to manage warfarin and heparin.  INR today remains stable  at 2.47. Planning for discharge today   Goal of Therapy:  INR 2-3 Monitor platelets by anticoagulation protocol: Yes   Plan:  Planning discharge on home warfarin regimen which is appropriate    Thank you for involving pharmacy in this patient's care.  Albertina Parr, PharmD., BCPS Clinical Pharmacist Clinical phone for 11/04/17 until 3:30pm: [phone] If after 3:30pm, please refer to [facility] for unit-specific pharmacist

## 2017-11-04 NOTE — Care Management Important Message (Signed)
Important Message  Patient Details  Name: Adam Savage MRN: [id number] Date of Birth: 03/23/1951   Medicare Important Message Given:  Yes    [name] P Trumbull 11/04/2017, 3:21 PM

## 2017-11-04 NOTE — Progress Notes (Signed)
Inpatient Diabetes Program Recommendations  AACE/ADA: New Consensus Statement on Inpatient Glycemic Control (2015)  Target Ranges:  Prepandial:   less than 140 mg/dL      Peak postprandial:   less than 180 mg/dL (1-2 hours)      Critically ill patients:  140 - 180 mg/dL   Lab Results  Component Value Date   GLUCAP 108 (H) 11/04/2017   HGBA1C 8.3 (H) 11/02/2017   Late entry -   Review of Glycemic Control  Diabetes history: DM2, diet-controlled Outpatient Diabetes medications: None Current orders for Inpatient glycemic control: Novolog 0-9 units tidwc and hs  Spoke with pt on 7/22 regarding his diabetes. Daughter did not show up for scheduled meeting at 3 pm to discuss his diabetes control.  In reviewing his blood sugars, 131-170 mg/dL during past 24H.  HgbA1C - 8.3%.  Inpatient Diabetes Program Recommendations:     Agree with lifestyle modifications with diet and exercise. Agree with not sending home on insulin.   Would benefit from consult for OP Diabetes Education as weight loss is imperative for managing his diabetes.   Discussed above with RN on 7/22.  Thank you. Lorenda Peck, RD, LDN, CDE Inpatient Diabetes Coordinator [phone]

## 2017-11-05 ENCOUNTER — Telehealth: Payer: Self-pay | Admitting: Interventional Cardiology

## 2017-11-05 NOTE — Telephone Encounter (Signed)
New message:   Pt daughter is calling to setup up follow-up ppt  with Dr. Irish Lack. It states on the discharge summary to follow up w/7 days. No appt w/app or Dr. Irish Lack. Please advise.

## 2017-11-05 NOTE — Telephone Encounter (Addendum)
Called and left messages for patient and his daughter to call back.  Would like to offer patient appointment for tomorrow at 2:20 PM with JV and have patient be seen in Coumadin Clinic at 2:30 PM. Please call my extension when call is returned. Thanks

## 2017-11-05 NOTE — Telephone Encounter (Signed)
Patient's daughter calling back and agrees for patient to be seen tomorrow at 2:20 PM with JV and have patient be seen in Coumadin Clinic at 2:30 PM.

## 2017-11-06 ENCOUNTER — Encounter: Payer: Self-pay | Admitting: Interventional Cardiology

## 2017-11-06 ENCOUNTER — Ambulatory Visit (INDEPENDENT_AMBULATORY_CARE_PROVIDER_SITE_OTHER): Payer: Medicare Other | Admitting: Interventional Cardiology

## 2017-11-06 ENCOUNTER — Ambulatory Visit (INDEPENDENT_AMBULATORY_CARE_PROVIDER_SITE_OTHER): Payer: Medicare Other | Admitting: *Deleted

## 2017-11-06 VITALS — BP 100/54 | HR 61 | Ht 72.0 in | Wt 266.8 lb

## 2017-11-06 DIAGNOSIS — I214 Non-ST elevation (NSTEMI) myocardial infarction: Secondary | ICD-10-CM | POA: Diagnosis not present

## 2017-11-06 DIAGNOSIS — G40909 Epilepsy, unspecified, not intractable, without status epilepticus: Secondary | ICD-10-CM | POA: Diagnosis not present

## 2017-11-06 DIAGNOSIS — G4733 Obstructive sleep apnea (adult) (pediatric): Secondary | ICD-10-CM | POA: Diagnosis not present

## 2017-11-06 DIAGNOSIS — Z7901 Long term (current) use of anticoagulants: Secondary | ICD-10-CM | POA: Diagnosis not present

## 2017-11-06 DIAGNOSIS — Z5181 Encounter for therapeutic drug level monitoring: Secondary | ICD-10-CM

## 2017-11-06 DIAGNOSIS — Z952 Presence of prosthetic heart valve: Secondary | ICD-10-CM

## 2017-11-06 LAB — POCT INR: INR: 6.1 — AB (ref 2.0–3.0)

## 2017-11-06 LAB — PROTIME-INR
INR: 6.8 (ref 0.8–1.2)
Prothrombin Time: 64.6 s — ABNORMAL HIGH (ref 9.1–12.0)

## 2017-11-06 NOTE — Patient Instructions (Addendum)
Description   Spoke with dtr Adam Savage & instructed her to have pt hold tomorrow's dose, No Coumadin on Saturday, and No Coumadin Sunday then resume taking 7.5mg  daily except 5mg  on Monday, Wednesday, and Friday. Recheck INR on Wednesday is Adam Savage.      A full discussion of the nature of anticoagulants has been carried out.  A benefit risk analysis has been presented to the patient, so that they understand the justification for choosing anticoagulation at this time. The need for frequent and regular monitoring, precise dosage adjustment and compliance is stressed.  Side effects of potential bleeding are discussed.  The patient should avoid any OTC items containing aspirin or ibuprofen, and should avoid great swings in general diet.  Avoid alcohol consumption.  Call if any signs of abnormal bleeding.

## 2017-11-06 NOTE — Progress Notes (Signed)
Cardiology Office Note   Date:  11/06/2017   ID:  Adam Savage, DOB 1950-12-04, MRN [id number]  PCP:  Patient, [name]    No chief complaint on file.  Aortic stenosis  Wt Readings from Last 3 Encounters:  11/06/17 266 lb 12.8 oz (121 kg)  11/04/17 262 lb 8 oz (119.1 kg)       History of Present Illness: Adam Savage is a 67 y.o. male  With a h/o mechanical aortic valve replacement.  He was hospitalized for NSTEMI in 7/19.    I spoke to the daughter in the hospoital and here was the summary of his social situation: " I had a long discussion with the patient's daughter.  He has had a difficult time now for several months.  The patient's wife had to go stay with her parents to take care of them in their aging years.  The husband was left alone.  The daughter visited and realized quickly that the patient was unable to care for himself.  He has issues with compliance with medications.  He has issues with hygiene.  He has not been using CPAP.  He has irregular sleep patterns per the daughter.  She is also concerned about his memory, and thinks the problems may be related to his prior brain surgery.  She thinks he show signs of dementia.    The daughter brought her father to live with her in [place], from [place].  She has had a difficult time, particularly in the last few days.  She will certainly need some assistance from case managers. "  He ultimately did undergo cardiac catheterization.  There was a lesion in the distal LAD which is thought to be perhaps from embolism from his valve.  He had a subtherapeutic INR.  He has been doing well since getting out of the hospital.  THe daughter reports that he is more awake and motivated than he has been for a year.    Denies : Chest pain. Dizziness. Leg edema. Nitroglycerin use. Orthopnea. Palpitations. Paroxysmal nocturnal dyspnea. Shortness of breath. Syncope.    Past Medical History:  Diagnosis Date  . COPD (chronic  obstructive pulmonary disease) ([place])   . Mechanical heart valve present   . Pre-diabetes   . Seizures ([place])     Past Surgical History:  Procedure Laterality Date  . CARDIAC SURGERY    . COLON SURGERY    . CORONARY/GRAFT ANGIOGRAPHY N/A 10/31/2017   Procedure: CORONARY/GRAFT ANGIOGRAPHY;  Surgeon: Nelva Bush, MD;  Location: [facility];  Service: Cardiovascular;  Laterality: N/A;  . JOINT REPLACEMENT    . KNEE SURGERY Left      Current Outpatient Medications  Medication Sig Dispense Refill  . aspirin 81 MG chewable tablet Chew 1 tablet (81 mg total) by mouth daily. 30 tablet 0  . atorvastatin (LIPITOR) 10 MG tablet Take 1 tablet (10 mg total) by mouth daily. 30 tablet 0  . blood glucose meter kit and supplies Dispense based on patient and insurance preference. Use up to four times daily as directed. (FOR ICD-10 E10.9, E11.9). 1 each 0  . carvedilol (COREG) 6.25 MG tablet Take 1 tablet (6.25 mg total) by mouth 2 (two) times daily with a meal. 60 tablet 0  . escitalopram (LEXAPRO) 20 MG tablet Take 1 tablet by mouth daily.    Marland Kitchen levETIRAcetam (KEPPRA) 1000 MG tablet Take 1 tablet by mouth 2 (two) times daily.    . ramipril (ALTACE)  5 MG capsule Take 1 capsule by mouth daily.    . ranitidine (ZANTAC) 150 MG tablet Take 1 tablet by mouth 2 (two) times daily.    . tamsulosin (FLOMAX) 0.4 MG CAPS capsule Take 1 capsule by mouth every evening.    . traZODone (DESYREL) 100 MG tablet Take 200 mg by mouth at bedtime.  4  . warfarin (COUMADIN) 5 MG tablet Take 1 tablet by mouth daily. Every Monday, Wednesday and Friday    . warfarin (COUMADIN) 7.5 MG tablet Take 7.5 mg by mouth daily. Every Sunday, Tuesday, Thursday and Saturday     No current facility-administered medications for this visit.     Allergies:   Patient has no known allergies.    Social History:  The patient  reports that he has been smoking cigarettes.  He has been smoking about 1.00 pack per day. He has never used  smokeless tobacco. He reports that he drank alcohol. He reports that he does not use drugs.   Family History:  The patient's family history is not on file.    ROS:  Please see the history of present illness.   Otherwise, review of systems are positive for fatigue.   All other systems are reviewed and negative.    PHYSICAL EXAM: VS:  BP (!) 100/54   Pulse 61   Ht 6' (1.829 m)   Wt 266 lb 12.8 oz (121 kg)   SpO2 94%   BMI 36.18 kg/m  , BMI Body mass index is 36.18 kg/m. GEN: Well nourished, well developed, in no acute distress  HEENT: normal  Neck: no JVD, carotid bruits, or masses Cardiac: RRR; crisp S2, no murmurs, rubs, or gallops,no edema  Respiratory:  clear to auscultation bilaterally, normal work of breathing GI: soft, nontender, nondistended, + BS MS: no deformity or atrophy  Skin: warm and dry, no rash Neuro:  Strength and sensation are intact Psych: euthymic mood, full affect    Recent Labs: 10/29/2017: Magnesium 2.0 11/03/2017: BUN 21; Creatinine, Ser 1.08; Hemoglobin 12.9; Platelets 138; Potassium 4.3; Sodium 135   Lipid Panel    Component Value Date/Time   CHOL 110 11/02/2017 0441   TRIG 69 11/02/2017 0441   HDL 36 (L) 11/02/2017 0441   CHOLHDL 3.1 11/02/2017 0441   VLDL 14 11/02/2017 0441   LDLCALC 60 11/02/2017 0441     Other studies Reviewed: Additional studies/ records that were reviewed today with results demonstrating: hospital records reviewd- cath report.   ASSESSMENT AND PLAN:  1. AVR : INR now to be regulated in our office.  He needs regular dental care with routine Antiboitic prophylaxis.   2. Anticoagulated: No bleeding problems.  INR to be checked today.  PCP list given. 3. OSA: Refer to sleep medicine for CPAP titration.   4. Obesity: He is trying to lose weight.  5. Seizure disorder: Refer to Neurology to establish care. 6. No significant CAD, likely embolic event to the distal LAD.    Current medicines are reviewed at length with the  patient today.  The patient concerns regarding his medicines were addressed.  The following changes have been made:  No change  Labs/ tests ordered today include:  No orders of the defined types were placed in this encounter.   Recommend 150 minutes/week of aerobic exercise Low fat, low carb, high fiber diet recommended  Disposition:   FU in 6 months   Signed, Larae Grooms, MD  11/06/2017 2:51 PM    [facility]  [address], [place], [place]  [address] Phone: [phone]; Fax: [phone]

## 2017-11-06 NOTE — Patient Instructions (Signed)
Medication Instructions:  Your physician recommends that you continue on your current medications as directed. Please refer to the Current Medication list given to you today.   Labwork: None ordered  Testing/Procedures: None ordered  Follow-Up: You have been referred to Neurology for Seizures  You have been referred to Sleep Medicine for Sleep Apnea  Papers have been given for you to establish a Primary Care Doctor  Your physician wants you to follow-up in: 6 months with Dr. Irish Lack. You will receive a reminder letter in the mail two months in advance. If you don't receive a letter, please call our office to schedule the follow-up appointment.   Any Other Special Instructions Will Be Listed Below (If Applicable).     If you need a refill on your cardiac medications before your next appointment, please call your pharmacy.

## 2017-11-12 ENCOUNTER — Ambulatory Visit (INDEPENDENT_AMBULATORY_CARE_PROVIDER_SITE_OTHER): Payer: Medicare Other | Admitting: *Deleted

## 2017-11-12 DIAGNOSIS — I214 Non-ST elevation (NSTEMI) myocardial infarction: Secondary | ICD-10-CM | POA: Diagnosis not present

## 2017-11-12 DIAGNOSIS — Z952 Presence of prosthetic heart valve: Secondary | ICD-10-CM

## 2017-11-12 DIAGNOSIS — Z5181 Encounter for therapeutic drug level monitoring: Secondary | ICD-10-CM | POA: Diagnosis not present

## 2017-11-12 LAB — POCT INR: INR: 1.4 — AB (ref 2.0–3.0)

## 2017-11-12 NOTE — Patient Instructions (Signed)
Take coumadin 7.5mg  tonight then resume 7.5mg  on Sundays, Tuesdays, Thursdays and Saturdays and 5mg  on Mondays, Wednesdays and Fridays.  Recheck INR on Wednesday in [place].

## 2017-11-14 ENCOUNTER — Ambulatory Visit (INDEPENDENT_AMBULATORY_CARE_PROVIDER_SITE_OTHER): Payer: Medicare Other | Admitting: Neurology

## 2017-11-14 ENCOUNTER — Encounter: Payer: Self-pay | Admitting: Neurology

## 2017-11-14 ENCOUNTER — Other Ambulatory Visit: Payer: Self-pay

## 2017-11-14 VITALS — BP 100/62 | HR 60 | Ht 72.0 in | Wt 273.0 lb

## 2017-11-14 DIAGNOSIS — R4189 Other symptoms and signs involving cognitive functions and awareness: Secondary | ICD-10-CM

## 2017-11-14 DIAGNOSIS — F321 Major depressive disorder, single episode, moderate: Secondary | ICD-10-CM

## 2017-11-14 DIAGNOSIS — G4700 Insomnia, unspecified: Secondary | ICD-10-CM | POA: Diagnosis not present

## 2017-11-14 DIAGNOSIS — G40009 Localization-related (focal) (partial) idiopathic epilepsy and epileptic syndromes with seizures of localized onset, not intractable, without status epilepticus: Secondary | ICD-10-CM | POA: Diagnosis not present

## 2017-11-14 MED ORDER — MIRTAZAPINE 15 MG PO TABS: ORAL_TABLET | ORAL | 11 refills | Status: DC

## 2017-11-14 MED ORDER — LEVETIRACETAM 1000 MG PO TABS: 1000.0000 mg | ORAL_TABLET | Freq: Two times a day (BID) | ORAL | 3 refills | Status: DC
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
## 2017-11-19 ENCOUNTER — Ambulatory Visit (INDEPENDENT_AMBULATORY_CARE_PROVIDER_SITE_OTHER): Payer: Medicare Other | Admitting: *Deleted

## 2017-11-19 DIAGNOSIS — Z952 Presence of prosthetic heart valve: Secondary | ICD-10-CM | POA: Diagnosis not present

## 2017-11-19 DIAGNOSIS — I214 Non-ST elevation (NSTEMI) myocardial infarction: Secondary | ICD-10-CM

## 2017-11-19 DIAGNOSIS — Z5181 Encounter for therapeutic drug level monitoring: Secondary | ICD-10-CM | POA: Diagnosis not present

## 2017-11-19 LAB — POCT INR: INR: 3.4 — AB (ref 2.0–3.0)

## 2017-11-19 NOTE — Patient Instructions (Signed)
Hold coumadin tonight then decrease dose to 5mg  daily except 7.5mg  on Sundays, Tuesdays and Thursdays.  Recheck INR on 8/23 in [place].

## 2017-11-26 NOTE — Addendum Note (Signed)
Addended by: Drue Novel I on: 11/26/2017 03:29 PM   Modules accepted: Orders

## 2017-11-27 ENCOUNTER — Other Ambulatory Visit: Payer: Self-pay

## 2017-11-27 MED ORDER — CARVEDILOL 6.25 MG PO TABS: 6.2500 mg | ORAL_TABLET | Freq: Two times a day (BID) | ORAL | 3 refills | Status: DC

## 2017-11-27 MED ORDER — RAMIPRIL 5 MG PO CAPS: 5.0000 mg | ORAL_CAPSULE | Freq: Every day | ORAL | 3 refills | Status: DC

## 2017-12-05 ENCOUNTER — Ambulatory Visit (INDEPENDENT_AMBULATORY_CARE_PROVIDER_SITE_OTHER): Payer: Medicare Other | Admitting: *Deleted

## 2017-12-05 DIAGNOSIS — Z952 Presence of prosthetic heart valve: Secondary | ICD-10-CM | POA: Diagnosis not present

## 2017-12-05 DIAGNOSIS — I214 Non-ST elevation (NSTEMI) myocardial infarction: Secondary | ICD-10-CM | POA: Diagnosis not present

## 2017-12-05 DIAGNOSIS — Z5181 Encounter for therapeutic drug level monitoring: Secondary | ICD-10-CM | POA: Diagnosis not present

## 2017-12-05 LAB — POCT INR: INR: 2.7 (ref 2.0–3.0)

## 2017-12-05 NOTE — Patient Instructions (Signed)
Description   Continue taking  5mg  daily except 7.5mg  on Sundays, Tuesdays and Thursdays.  Recheck INR in 2 weeks.

## 2017-12-16 ENCOUNTER — Telehealth: Payer: Self-pay | Admitting: Interventional Cardiology

## 2017-12-16 DIAGNOSIS — G4733 Obstructive sleep apnea (adult) (pediatric): Secondary | ICD-10-CM

## 2017-12-16 NOTE — Telephone Encounter (Signed)
° ° °  Patient's daughter requesting to speak with nurse; following up on conversation regarding records

## 2017-12-16 NOTE — Telephone Encounter (Signed)
Returned call to Avella, patient's daughter (DPR on file). She states that she has the patients Sleep Records and would like to fax over the information. Fax number provided. Made daughter aware that once records are received I would discuss with Dr. Radford Pax about scheduling an appointment with her. Daughter verbalized understanding and was appreciative of the call.

## 2017-12-19 ENCOUNTER — Ambulatory Visit (INDEPENDENT_AMBULATORY_CARE_PROVIDER_SITE_OTHER): Payer: Medicare Other | Admitting: *Deleted

## 2017-12-19 DIAGNOSIS — I214 Non-ST elevation (NSTEMI) myocardial infarction: Secondary | ICD-10-CM | POA: Diagnosis not present

## 2017-12-19 DIAGNOSIS — Z952 Presence of prosthetic heart valve: Secondary | ICD-10-CM | POA: Diagnosis not present

## 2017-12-19 DIAGNOSIS — Z5181 Encounter for therapeutic drug level monitoring: Secondary | ICD-10-CM | POA: Diagnosis not present

## 2017-12-19 LAB — POCT INR: INR: 3.5 — AB (ref 2.0–3.0)

## 2017-12-19 NOTE — Telephone Encounter (Signed)
Returned call to patient's daughter Caryl Pina (DPR on file). Daughter checking to see if we received the fax. Made daughter aware that the fax was received and I am going to review with Dr. Radford Pax on Monday and call her back.

## 2017-12-19 NOTE — Patient Instructions (Signed)
Hold coumadin tonight then decrease dose to 5mg  daily except 7.5mg  on Sundays and Thursdays.  Recheck INR in 2.5 weeks.

## 2017-12-19 NOTE — Telephone Encounter (Signed)
Follow Up:    Caryl Pina returning your call.

## 2017-12-21 ENCOUNTER — Other Ambulatory Visit: Payer: Self-pay

## 2017-12-21 ENCOUNTER — Inpatient Hospital Stay (HOSPITAL_COMMUNITY)
Admission: EM | Admit: 2017-12-21 | Discharge: 2017-12-27 | DRG: 292 | Disposition: A | Discharge status: Skilled Nursing Facility | Payer: Medicare Other | Attending: Internal Medicine | Admitting: Internal Medicine

## 2017-12-21 ENCOUNTER — Emergency Department (HOSPITAL_COMMUNITY): Payer: Medicare Other

## 2017-12-21 ENCOUNTER — Encounter (HOSPITAL_COMMUNITY): Payer: Self-pay

## 2017-12-21 DIAGNOSIS — I509 Heart failure, unspecified: Secondary | ICD-10-CM

## 2017-12-21 DIAGNOSIS — J449 Chronic obstructive pulmonary disease, unspecified: Secondary | ICD-10-CM | POA: Diagnosis not present

## 2017-12-21 DIAGNOSIS — Z7901 Long term (current) use of anticoagulants: Secondary | ICD-10-CM

## 2017-12-21 DIAGNOSIS — G40909 Epilepsy, unspecified, not intractable, without status epilepticus: Secondary | ICD-10-CM | POA: Diagnosis present

## 2017-12-21 DIAGNOSIS — D696 Thrombocytopenia, unspecified: Secondary | ICD-10-CM | POA: Diagnosis present

## 2017-12-21 DIAGNOSIS — R3129 Other microscopic hematuria: Secondary | ICD-10-CM | POA: Diagnosis present

## 2017-12-21 DIAGNOSIS — I252 Old myocardial infarction: Secondary | ICD-10-CM

## 2017-12-21 DIAGNOSIS — E1165 Type 2 diabetes mellitus with hyperglycemia: Secondary | ICD-10-CM | POA: Diagnosis present

## 2017-12-21 DIAGNOSIS — M79661 Pain in right lower leg: Secondary | ICD-10-CM | POA: Diagnosis not present

## 2017-12-21 DIAGNOSIS — R748 Abnormal levels of other serum enzymes: Secondary | ICD-10-CM | POA: Diagnosis present

## 2017-12-21 DIAGNOSIS — Z952 Presence of prosthetic heart valve: Secondary | ICD-10-CM

## 2017-12-21 DIAGNOSIS — I517 Cardiomegaly: Secondary | ICD-10-CM | POA: Diagnosis not present

## 2017-12-21 DIAGNOSIS — I959 Hypotension, unspecified: Secondary | ICD-10-CM | POA: Diagnosis present

## 2017-12-21 DIAGNOSIS — N179 Acute kidney failure, unspecified: Secondary | ICD-10-CM | POA: Diagnosis present

## 2017-12-21 DIAGNOSIS — I2510 Atherosclerotic heart disease of native coronary artery without angina pectoris: Secondary | ICD-10-CM | POA: Diagnosis present

## 2017-12-21 DIAGNOSIS — I5033 Acute on chronic diastolic (congestive) heart failure: Secondary | ICD-10-CM | POA: Diagnosis not present

## 2017-12-21 DIAGNOSIS — Z8249 Family history of ischemic heart disease and other diseases of the circulatory system: Secondary | ICD-10-CM

## 2017-12-21 DIAGNOSIS — F17210 Nicotine dependence, cigarettes, uncomplicated: Secondary | ICD-10-CM | POA: Diagnosis present

## 2017-12-21 DIAGNOSIS — G4733 Obstructive sleep apnea (adult) (pediatric): Secondary | ICD-10-CM | POA: Diagnosis present

## 2017-12-21 DIAGNOSIS — D692 Other nonthrombocytopenic purpura: Secondary | ICD-10-CM | POA: Diagnosis present

## 2017-12-21 DIAGNOSIS — B1920 Unspecified viral hepatitis C without hepatic coma: Secondary | ICD-10-CM | POA: Diagnosis not present

## 2017-12-21 DIAGNOSIS — D649 Anemia, unspecified: Secondary | ICD-10-CM | POA: Diagnosis present

## 2017-12-21 DIAGNOSIS — Z7982 Long term (current) use of aspirin: Secondary | ICD-10-CM

## 2017-12-21 DIAGNOSIS — Z79899 Other long term (current) drug therapy: Secondary | ICD-10-CM

## 2017-12-21 DIAGNOSIS — I38 Endocarditis, valve unspecified: Secondary | ICD-10-CM

## 2017-12-21 DIAGNOSIS — Z885 Allergy status to narcotic agent status: Secondary | ICD-10-CM

## 2017-12-21 DIAGNOSIS — M7989 Other specified soft tissue disorders: Secondary | ICD-10-CM | POA: Diagnosis present

## 2017-12-21 DIAGNOSIS — G4700 Insomnia, unspecified: Secondary | ICD-10-CM | POA: Diagnosis present

## 2017-12-21 DIAGNOSIS — Z87442 Personal history of urinary calculi: Secondary | ICD-10-CM

## 2017-12-21 DIAGNOSIS — G3184 Mild cognitive impairment, so stated: Secondary | ICD-10-CM | POA: Diagnosis present

## 2017-12-21 DIAGNOSIS — R791 Abnormal coagulation profile: Secondary | ICD-10-CM | POA: Diagnosis present

## 2017-12-21 DIAGNOSIS — R600 Localized edema: Secondary | ICD-10-CM | POA: Diagnosis not present

## 2017-12-21 DIAGNOSIS — S86811A Strain of other muscle(s) and tendon(s) at lower leg level, right leg, initial encounter: Secondary | ICD-10-CM | POA: Diagnosis present

## 2017-12-21 DIAGNOSIS — R011 Cardiac murmur, unspecified: Secondary | ICD-10-CM | POA: Diagnosis present

## 2017-12-21 DIAGNOSIS — X58XXXA Exposure to other specified factors, initial encounter: Secondary | ICD-10-CM | POA: Diagnosis present

## 2017-12-21 DIAGNOSIS — I5030 Unspecified diastolic (congestive) heart failure: Secondary | ICD-10-CM

## 2017-12-21 DIAGNOSIS — Z9049 Acquired absence of other specified parts of digestive tract: Secondary | ICD-10-CM

## 2017-12-21 DIAGNOSIS — K219 Gastro-esophageal reflux disease without esophagitis: Secondary | ICD-10-CM | POA: Diagnosis present

## 2017-12-21 DIAGNOSIS — Z825 Family history of asthma and other chronic lower respiratory diseases: Secondary | ICD-10-CM

## 2017-12-21 HISTORY — DX: Type 2 diabetes mellitus without complications: E11.9

## 2017-12-21 LAB — URINALYSIS, ROUTINE W REFLEX MICROSCOPIC
Bacteria, UA: NONE SEEN
Bilirubin Urine: NEGATIVE
Glucose, UA: 150 mg/dL — AB
Ketones, ur: NEGATIVE mg/dL
Leukocytes, UA: NEGATIVE
Nitrite: NEGATIVE
Protein, ur: 30 mg/dL — AB
RBC / HPF: >50 RBC/hpf — ABNORMAL HIGH (ref 0–5)
Specific Gravity, Urine: 1.028 (ref 1.005–1.030)
pH: 5 (ref 5.0–8.0)

## 2017-12-21 LAB — COMPREHENSIVE METABOLIC PANEL
ALT: 39 U/L (ref 0–44)
AST: 51 U/L — ABNORMAL HIGH (ref 15–41)
Albumin: 2.5 g/dL — ABNORMAL LOW (ref 3.5–5.0)
Alkaline Phosphatase: 157 U/L — ABNORMAL HIGH (ref 38–126)
Anion gap: 7 (ref 5–15)
BUN: 32 mg/dL — ABNORMAL HIGH (ref 8–23)
CO2: 24 mmol/L (ref 22–32)
Calcium: 8.5 mg/dL — ABNORMAL LOW (ref 8.9–10.3)
Chloride: 103 mmol/L (ref 98–111)
Creatinine, Ser: 1.46 mg/dL — ABNORMAL HIGH (ref 0.61–1.24)
GFR calc Af Amer: 56 mL/min — ABNORMAL LOW (ref >60)
GFR calc non Af Amer: 48 mL/min — ABNORMAL LOW (ref >60)
Glucose, Bld: 278 mg/dL — ABNORMAL HIGH (ref 70–99)
Potassium: 4.8 mmol/L (ref 3.5–5.1)
Sodium: 134 mmol/L — ABNORMAL LOW (ref 135–145)
Total Bilirubin: 0.8 mg/dL (ref 0.3–1.2)
Total Protein: 6.4 g/dL — ABNORMAL LOW (ref 6.5–8.1)

## 2017-12-21 LAB — CBC WITH DIFFERENTIAL/PLATELET
Abs Immature Granulocytes: 0 10*3/uL (ref 0.0–0.1)
Basophils Absolute: 0 10*3/uL (ref 0.0–0.1)
Basophils Relative: 1 %
Eosinophils Absolute: 0.1 10*3/uL (ref 0.0–0.7)
Eosinophils Relative: 2 %
HCT: 28.7 % — ABNORMAL LOW (ref 39.0–52.0)
Hemoglobin: 9.1 g/dL — ABNORMAL LOW (ref 13.0–17.0)
Immature Granulocytes: 0 %
Lymphocytes Relative: 17 %
Lymphs Abs: 1.1 10*3/uL (ref 0.7–4.0)
MCH: 26.7 pg (ref 26.0–34.0)
MCHC: 31.7 g/dL (ref 30.0–36.0)
MCV: 84.2 fL (ref 78.0–100.0)
Monocytes Absolute: 0.5 10*3/uL (ref 0.1–1.0)
Monocytes Relative: 8 %
Neutro Abs: 4.9 10*3/uL (ref 1.7–7.7)
Neutrophils Relative %: 72 %
Platelets: 127 10*3/uL — ABNORMAL LOW (ref 150–400)
RBC: 3.41 MIL/uL — ABNORMAL LOW (ref 4.22–5.81)
RDW: 15 % (ref 11.5–15.5)
WBC: 6.8 10*3/uL (ref 4.0–10.5)

## 2017-12-21 LAB — PROTIME-INR
INR: 2.43
Prothrombin Time: 26.2 seconds — ABNORMAL HIGH (ref 11.4–15.2)

## 2017-12-21 LAB — I-STAT CG4 LACTIC ACID, ED: Lactic Acid, Venous: 1.72 mmol/L (ref 0.5–1.9)

## 2017-12-21 LAB — BRAIN NATRIURETIC PEPTIDE: B Natriuretic Peptide: 425.3 pg/mL — ABNORMAL HIGH (ref 0.0–100.0)

## 2017-12-21 MED ORDER — METHYLPREDNISOLONE SODIUM SUCC 125 MG IJ SOLR
125.0000 mg | Freq: Once | INTRAMUSCULAR | Status: AC
  Administered 2017-12-21: 125 mg via INTRAVENOUS
  Filled 2017-12-21: qty 2

## 2017-12-21 MED ORDER — FUROSEMIDE 10 MG/ML IJ SOLN
20.0000 mg | INTRAMUSCULAR | Status: AC
  Administered 2017-12-21: 20 mg via INTRAVENOUS
  Filled 2017-12-21: qty 2

## 2017-12-21 MED ORDER — IPRATROPIUM-ALBUTEROL 0.5-2.5 (3) MG/3ML IN SOLN
3.0000 mL | Freq: Once | RESPIRATORY_TRACT | Status: AC
  Administered 2017-12-21: 3 mL via RESPIRATORY_TRACT
  Filled 2017-12-21: qty 3

## 2017-12-21 NOTE — ED Provider Notes (Signed)
[facility] EMERGENCY DEPARTMENT Provider Note   CSN: [id number] Arrival date & time: 12/21/17  1919     History   Chief Complaint Chief Complaint  Patient presents with  . Leg Swelling    HPI Adam Savage is a 67 y.o. male.  67 year old male with extensive past medical history including COPD, type 2 diabetes mellitus, CAD, aortic valve replacement on Coumadin who presents with multiple complaints including right leg pain and shortness of breath.  Patient states that 2 days ago he was walking and stepped down with his right foot, had a sudden onset of pain in his right calf.  He has continued to have pain in this area as well as on the top of his right foot.  Daughter noticed some bruising to his leg and heel as well as his foot that have worsened.  She notes he has had swelling in both legs but his right swelling is now worse.  Pt also notes ongoing problems with shortness of breath.  Daughter states that he began getting more short of breath last night into today than he usually is.  He is intermittently compliant with CPAP at night.  He has a chronic dry cough, no sputum production or fevers.  No chest pain.  He reports orthopnea that is chronic.  He thinks that he is gained about 9 pounds recently.  The history is provided by the patient and a relative.    Past Medical History:  Diagnosis Date  . COPD (chronic obstructive pulmonary disease) ([place])   . Diabetes mellitus without complication ([place])   . Mechanical heart valve present   . Pre-diabetes   . Seizures [facility])     Patient Active Problem List   Diagnosis Date Noted  . Encounter for therapeutic drug monitoring 11/06/2017  . Non-ST elevation (NSTEMI) myocardial infarction ([place])   . Anticoagulant long-term use   . Unstable angina ([place]) 10/29/2017  . Chest pain 10/28/2017  . Seizures ([place])   . COPD (chronic obstructive pulmonary disease) ([place])   . Mechanical heart valve present   . Pre-diabetes      Past Surgical History:  Procedure Laterality Date  . CARDIAC SURGERY    . COLON SURGERY    . CORONARY/GRAFT ANGIOGRAPHY N/A 10/31/2017   Procedure: CORONARY/GRAFT ANGIOGRAPHY;  Surgeon: Nelva Bush, MD;  Location: [facility];  Service: Cardiovascular;  Laterality: N/A;  . JOINT REPLACEMENT    . KNEE SURGERY Left         Home Medications    Prior to Admission medications   Medication Sig Start Date End Date Taking? Authorizing Provider  atorvastatin (LIPITOR) 10 MG tablet Take 1 tablet (10 mg total) by mouth daily. 11/04/17 12/04/17  Arrien, Jimmy Picket, MD  blood glucose meter kit and supplies Dispense based on patient and insurance preference. Use up to four times daily as directed. (FOR ICD-10 E10.9, E11.9). 11/04/17   Arrien, Jimmy Picket, MD  carvedilol (COREG) 6.25 MG tablet Take 1 tablet (6.25 mg total) by mouth 2 (two) times daily with a meal. 11/27/17 12/27/17  Jettie Booze, MD  levETIRAcetam (KEPPRA) 1000 MG tablet Take 1 tablet (1,000 mg total) by mouth 2 (two) times daily. 11/14/17   Cameron Sprang, MD  mirtazapine (REMERON) 15 MG tablet Take 1 tablet at night for 1 week, then increase to 2 tablets at night 11/14/17   Cameron Sprang, MD  ramipril (ALTACE) 5 MG capsule Take 1 capsule (5 mg total) by mouth  daily. 11/27/17   Jettie Booze, MD  ranitidine (ZANTAC) 150 MG tablet Take 1 tablet by mouth 2 (two) times daily. 05/01/17   [provider]  tamsulosin (FLOMAX) 0.4 MG CAPS capsule Take 1 capsule by mouth every evening. 05/01/17   [provider]  traZODone (DESYREL) 100 MG tablet Take 200 mg by mouth at bedtime. 10/15/17   [provider]  warfarin (COUMADIN) 5 MG tablet Take 1 tablet by mouth daily. Every Monday, Wednesday and Friday 05/02/17   [provider]  warfarin (COUMADIN) 7.5 MG tablet Take 7.5 mg by mouth daily. Every Sunday, Tuesday, Thursday and Saturday 05/01/17   [provider]    Family  History Family History  Problem Relation Age of Onset  . Hypertension Father     Social History Social History   Tobacco Use  . Smoking status: Current Every Day Smoker    Packs/day: 1.00    Types: Cigarettes  . Smokeless tobacco: Never Used  Substance Use Topics  . Alcohol use: Not Currently  . Drug use: Never     Allergies   Morphine and related   Review of Systems Review of Systems All other systems reviewed and are negative except that which was mentioned in HPI   Physical Exam Updated Vital Signs BP (!) 80/57   Pulse 65   Temp (!) 97.5 F (36.4 C) (Oral)   Resp (!) 26   SpO2 99%   Physical Exam  Constitutional: He is oriented to person, place, and time. He appears well-developed and well-nourished. No distress.  Chronically ill-appearing  HENT:  Head: Normocephalic and atraumatic.  Moist mucous membranes  Eyes: Pupils are equal, round, and reactive to light. Conjunctivae are normal.  Neck: Neck supple.  Cardiovascular: Normal rate, regular rhythm and normal heart sounds.  No murmur heard. Pulmonary/Chest:  Mildly increased WOB with dyspnea but no respiratory distress; diffuse expiratory wheezes  Abdominal: Soft. Bowel sounds are normal. He exhibits no distension. There is no tenderness.  Musculoskeletal: He exhibits edema.  2+ pitting edema LLE, 3+ pitting edema RLE; R Achilles tendon intact, no midfoot instability  Neurological: He is alert and oriented to person, place, and time.  Fluent speech  Skin: Skin is warm and dry.  Ecchymoses at anterior top of right shin, right heel, and R dorsal foot near base of toes  Psychiatric: He has a normal mood and affect. Judgment normal.  Nursing note and vitals reviewed.    ED Treatments / Results  Labs (all labs ordered are listed, but only abnormal results are displayed) Labs Reviewed  COMPREHENSIVE METABOLIC PANEL - Abnormal; Notable for the following components:      Result Value   Sodium 134 (*)     Glucose, Bld 278 (*)    BUN 32 (*)    Creatinine, Ser 1.46 (*)    Calcium 8.5 (*)    Total Protein 6.4 (*)    Albumin 2.5 (*)    AST 51 (*)    Alkaline Phosphatase 157 (*)    GFR calc non Af Amer 48 (*)    GFR calc Af Amer 56 (*)    All other components within normal limits  CBC WITH DIFFERENTIAL/PLATELET - Abnormal; Notable for the following components:   RBC 3.41 (*)    Hemoglobin 9.1 (*)    HCT 28.7 (*)    Platelets 127 (*)    All other components within normal limits  URINALYSIS, ROUTINE W REFLEX MICROSCOPIC - Abnormal; Notable for  the following components:   APPearance HAZY (*)    Glucose, UA 150 (*)    Hgb urine dipstick LARGE (*)    Protein, ur 30 (*)    RBC / HPF >50 (*)    All other components within normal limits  PROTIME-INR - Abnormal; Notable for the following components:   Prothrombin Time 26.2 (*)    All other components within normal limits  BRAIN NATRIURETIC PEPTIDE - Abnormal; Notable for the following components:   B Natriuretic Peptide 425.3 (*)    All other components within normal limits  I-STAT CG4 LACTIC ACID, ED    EKG None  Radiology No results found.  Procedures Procedures (including critical care time)  Medications Ordered in ED Medications  ipratropium-albuterol (DUONEB) 0.5-2.5 (3) MG/3ML nebulizer solution 3 mL (3 mLs Nebulization Given 12/21/17 2028)  methylPREDNISolone sodium succinate (SOLU-MEDROL) 125 mg/2 mL injection 125 mg (125 mg Intravenous Given 12/21/17 2046)  furosemide (LASIX) injection 20 mg (20 mg Intravenous Given 12/21/17 2251)     Initial Impression / Assessment and Plan / ED Course  I have reviewed the triage vital signs and the nursing notes.  Pertinent labs & imaging results that were available during my care of the patient were reviewed by me and considered in my medical decision making (see chart for details).    PT chronically ill-appearing but comfortable and in no acute distress.  Normal O2 saturation but he  did have wheezing bilaterally and mildly increased work of breathing.  His initial blood pressure was 80/57, daughter notes ongoing problems with blood pressure.  He is afebrile.  He has some bruising on right leg and I am concerned about the possibility of partial achilles or calf rupture given bruising pattern and story of sudden pain when stepping down.  X-rays negative for fracture.  No signs of cellulitis. He is on coumadin thus DVT seems unlikely. I have ordered CAM walker and recommended outpatient ortho eval.   His lab work shows UA containing blood but patient denies any urinary symptoms, INR 2.43, normal lactate, BNP 425, mildly elevated creatinine at 1.46, glucose 278, hemoglobin 9.1.  Chest x-ray shows worsening cardiomegaly and CHF changes.  The patient is not currently on Lasix and is not aware of any previous history of CHF.  I suspect some of his acute kidney injury may be related to volume overload and I have given him an initial dose of Lasix.  I am also concerned that he may be having COPD exacerbation, will need ongoing steroids but this will be difficult given his already elevated blood glucose.  Recommended admission.  Discussed with Internal Med teaching service and pt admitted for further care.  Final Clinical Impressions(s) / ED Diagnoses   Final diagnoses:  Acute on chronic congestive heart failure, unspecified heart failure type ([place])  Pain and swelling of right lower leg    ED Discharge Orders    None       Little, Wenda Overland, MD 12/22/17 0025

## 2017-12-21 NOTE — ED Notes (Signed)
Patient transported to X-ray 

## 2017-12-21 NOTE — ED Triage Notes (Signed)
Pt states that two day ago he began to have pain in his R calf this noticed bruising, redness warmth and swelling over the past two days, pt has audible wheezing and rhonchi in all lobes, hx of COPD and MI, denies SOB that is not normal for him. Pt hypotensive in triage 80/57, per daughter this is an ongoing issue. Afebrile. Pt is on coumadin

## 2017-12-21 NOTE — ED Notes (Signed)
Pt arrived from Xray

## 2017-12-21 NOTE — H&P (Signed)
Date: 12/22/2017               Patient Name:  Adam Savage MRN: [id number]  DOB: 1950-10-26 Age / Sex: 67 y.o., male   PCP: Patient, [name]         Medical Service: Internal Medicine Teaching Service         Attending Physician: Dr. Bartholomew Crews, MD    First Contact: Dr. Truman Hayward Pager: [phone]  Second Contact: Dr. Trilby Drummer Pager: [phone]       After Hours (After 5p/  First Contact Pager: [phone]  weekends / holidays): Second Contact Pager: [phone]   Chief Complaint: RLE pain and swelling  History of Present Illness: Mr. 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 His symptoms started acutely earlier today when he stepped down into his den. He felt a tearing sensation, but did not notice a pop. Since then he has had pain in his calf, but denies ankle or foot pain. He is able to walk, but walking exacerbates the pain. He has not tried any medications for the pain. He also endorses chronic swelling in bilateral lower extremities and a dry cough that has worsened in the past few days. He reports chronic shortness of breath that is worse with exertion, but he denies worsened dyspnea at present. He denies orthopnea and states that he is able to lay flat at night. He weighs himself daily and has noticed a 13lb weight gain over the past few days. He denies fevers, chest pain, abdominal pain, weakness, dizziness, or urinary symptoms. He has smoked 1/2 ppd for the past 40 years. He has a history of COPD and diastolic heart failure, but he is not on any inhalers or diuretics at home.   Upon arrival to the ED, vitals were T 97.4, RR 26, HR 65, BP 80/57, and O2  sat 99% on RA. Labs were significant for Hb 9.1 (12.9 on 11/03/17), Plt 127, Na 134, glucose 278, BUN 32, Cr 1.46, albumin 2.5, AST 51, alk phos 157, INR 2.43, BNP 425, lactate neg. UA showed 150 glucose, large Hb, 30 protein, >50 RBCs. EKG showed NSR with LAD. CXR showed cardiomegaly with changes suggestive of CHF. Right foot xray and right tib/fib xray showed soft tissue edema without fracture. He received a duoneb treatment, solumedrol, and lasix 20 mg IV with some relief in his dyspnea.  Meds:  Current Meds  Medication Sig  . acetaminophen (TYLENOL) 650 MG CR tablet Take 1,300 mg by mouth every 8 (eight) hours as needed for pain.  Marland Kitchen aspirin EC 81 MG tablet Take 81 mg by mouth daily.  Marland Kitchen atorvastatin (LIPITOR) 10 MG tablet Take 1 tablet (10 mg total) by mouth daily.  . carvedilol (COREG) 6.25 MG tablet Take 1 tablet (6.25 mg total) by mouth 2 (two) times daily with a meal.  . levETIRAcetam (KEPPRA) 1000 MG tablet Take 1 tablet (1,000 mg total) by mouth 2 (two) times daily.  . mirtazapine (REMERON) 30 MG tablet Take 30 mg by mouth at bedtime.  . ramipril (ALTACE) 5 MG capsule Take 1 capsule (5 mg total) by mouth daily.  Marland Kitchen  ranitidine (ZANTAC) 150 MG tablet Take 150 mg by mouth 2 (two) times daily.   . tamsulosin (FLOMAX) 0.4 MG CAPS capsule Take 0.4 mg by mouth at bedtime.   . traZODone (DESYREL) 100 MG tablet Take 200 mg by mouth at bedtime.  Marland Kitchen warfarin (COUMADIN) 5 MG tablet Take 5 mg by mouth See admin instructions. Take one tablet (5 mg) by mouth Monday, Tuesday, Wednesday, Friday, Saturday nights - 9pm  . warfarin (COUMADIN) 7.5 MG tablet Take 7.5 mg by mouth See admin instructions. Take one tablet (7.5 mg) by mouth on Sunday and Thursday nights - 9pm (take 5 mg tablet on all other days of the week)   Allergies: Allergies as of 12/21/2017 - Review Complete 12/21/2017  Allergen Reaction Noted  . Morphine and related Other (See Comments) 12/21/2017   Past Medical History:  Diagnosis Date    . COPD (chronic obstructive pulmonary disease) ([place])   . Diabetes mellitus without complication ([place])   . Mechanical heart valve present   . Pre-diabetes   . Seizures ([place])    Past Surgical History:  Procedure Laterality Date  . CARDIAC SURGERY    . CHOLECYSTECTOMY    . COLON SURGERY    . CORONARY/GRAFT ANGIOGRAPHY N/A 10/31/2017   Procedure: CORONARY/GRAFT ANGIOGRAPHY;  Surgeon: Nelva Bush, MD;  Location: [facility];  Service: Cardiovascular;  Laterality: N/A;  . JOINT REPLACEMENT    . KNEE SURGERY Left     Family History: Strong family history of smoking and COPD. Father has HTN. Brother has afib.  Social History: The patient lives at home with his daughter, son-in-law, and 13-year-old granddaughter. He is married, but his wife is currently living with and taking care of her own father. His daughter helps him with his medications. He has smoked 1/2 ppd for 40 years. He denies etoh or illicit drug use.  Review of Systems: A complete ROS was negative except as per HPI.   Physical Exam: Blood pressure (!) 118/32, pulse 79, temperature (!) 97.5 F (36.4 C), temperature source Oral, resp. rate 20, height 6' (1.829 m), weight 129.7 kg, SpO2 97 %.  Constitutional: Well-developed, well-nourished, and in no distress.  Eyes: Pupils are equal, round, and reactive to light. EOM are normal.  Cardiovascular: Normal rate and regular rhythm. Loud S1. No murmurs, rubs, or gallops. Pulmonary/Chest: Patient initially appears to have labored breathing, but throughout the exam he appears to become more comfortable with normal effort. End-expiratory wheezing throughout. No crackles. Abdominal: Obese. Bowel sounds present. Soft, non-distended, non-tender. Ext: 2+ pitting edema on the LLE. Non-pitting edema of the RLE. RLE swelling > LLE swelling. Ecchymoses of the R posterior calf, medial and lateral ankle, and dorsal foot. TTP of the right calf. Normal sensation and strength in the RLE.  Unable to palpate pedal pulses due to edema. Cap refill <2 seconds in the left toes.  Skin: Warm and dry. Bright red to purple palpable purpura to the left lower extremity and left upper extremity.    EKG: personally reviewed my interpretation is NSR. Q waves in V1 and aVL are stable from prior. T wave inversions V4-V6 on prior are no longer present.  CXR: personally reviewed my interpretation is pulmonary edema with Kerley B lines noted in the right lung base.  Assessment & Plan by Problem: Active Problems:   Leg swelling  Mr. 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 Clinical picture is most consistent with a muscular tear of the right calf with hematoma formation in the setting of anticoagulation on Warfarin.  Right Calf Pain Anemia - Pain started acutely after taking a step down onto the right foot - Exam shows right calf TTP and edema and ecchymoses of the R posterior calf, medial and lateral ankle, and dorsal foot.  - No concern for major neurovascular injury based on normal strength and sensation in the right lower extremity. However, pedal pulses not appreciable due to edema. - Right lower leg and foot xrays showed soft tissue edema without fracture. - Do not suspect DVT given therapeutic INR on Coumadin. He has no hx of DVT or PE. Wells score = 0.  - Most likely muscular tear in the calf with hematoma formation in the setting of anticoagulation use. Hb has dropped from 12.9 to 9.1 in the past 2 months, which is most likely due to a muscle strain. Plan - Apply Cam Walker - Lower extremity  doppler - AM CBC to monitor Hb - Neurovascular checks q4hrs - Fall precautions  Chronic diastolic heart failure - EF 55% on Echo 7/19 - Reported dry weight is 275lbs. Weight on admission 284lbs. Weight gain of 9 lbs in the past few days. - Despite weight gain, CXR with pulmonary edema, elevated BNP to 425, and lower extremity edema, do not suspect CHF exacerbation given baseline dyspnea and lack of crackles on exam.  Plan - Continue home Coreg 6.25 mg BID - Holding home ramipril 2/2 AKI - Daily weights - Strict I/Os  COPD - Pt denies having undergone PFTs. He is on no COPD medications at home. He has chronic dyspnea that is worse with exertion. He continues to smoke 1/2 ppd. - Exam shows end-expiratory wheezing throughout lungs. Patient reports increased dry cough over the past few days. Low suspicion for COPD exacerbation. - Pt would benefit from formal PFTs and likely COPD maintenance and rescue therapy. Plan - Duonebs q6hrs  Hypotension - Initial BP 80/57. Increased to 118/32 after lasix. Did not receive IV fluids. Has no symptoms of dizziness or syncope. - DDx includes hypovolemia in the setting of right calf hematoma and decreased perfusion from CHF exacerbation.  Plan - Hold home Coreg and ramipril - Hold home flomax - Orthostatic vital signs  AKI - Cr 1.46 (up from 1.08 in July) - Likely prerenal from hypotension (calf hematoma vs. CHF exacerbation). LLE rash is suspicious for vasculitis, which can cause intrarenal injury. If AKI does not improve with stabilization of the calf bleed, consider vasculitis work up.  Plan - Trend Cr   Thrombocytopenia - Appears to be chronic  - 2017 abdominal US showed hepatic steatosis. However, normal ALT and bilirubin argue against severe liver disease. Plan - Daily CBC - Blood smear  Elevated Alkaline Phosphatase - GGT to determine bone vs. liver etiology  CAD - NSTEMI with cath in 10/2017 Plan - Continue home aspirin and  lipitor  Mechanical heart valve - INR 2.43 (goal 2-3) Plan - Continue home Coumadin dosing (5 mg M/Tu/W/Fri/Sa and 7.5 mg Th/Su)  DM - A1c 8.3 11/02/17. He is on no medications at home. He is trying to  manage with a low-sugar diet and increased exercise. Plan - SSI  Temporal lobe epilepsy - s/p right temporal lobectomy. No seizures since before this procedure. Plan - Continue home Keppra  OSA - CPAP at night  Insomnia - Continue home Remeron and Trazadone  GERD - Continue home PPI  Tobacco use - Nicotine patch  FEN: no IV fluids, heart healthy diet, replace electrolytes as needed  DVT ppx: home Coumadin Code status: FULL code  Dispo: Admit patient to Observation with expected length of stay less than 2 midnights.  Signed: Corinne Ports, MD 12/22/2017, 2:43 AM  Pager: [phone]

## 2017-12-21 NOTE — ED Notes (Signed)
Pt placed back on monitor after pt self removed wires.  Pt states he just wants to go to bed and rolled to his side.  Pt given urinal.

## 2017-12-22 ENCOUNTER — Encounter (HOSPITAL_COMMUNITY): Payer: Self-pay | Admitting: Internal Medicine

## 2017-12-22 ENCOUNTER — Inpatient Hospital Stay (HOSPITAL_COMMUNITY): Payer: Medicare Other

## 2017-12-22 DIAGNOSIS — R233 Spontaneous ecchymoses: Secondary | ICD-10-CM | POA: Diagnosis not present

## 2017-12-22 DIAGNOSIS — Z8249 Family history of ischemic heart disease and other diseases of the circulatory system: Secondary | ICD-10-CM

## 2017-12-22 DIAGNOSIS — R279 Unspecified lack of coordination: Secondary | ICD-10-CM | POA: Diagnosis not present

## 2017-12-22 DIAGNOSIS — D696 Thrombocytopenia, unspecified: Secondary | ICD-10-CM

## 2017-12-22 DIAGNOSIS — I2510 Atherosclerotic heart disease of native coronary artery without angina pectoris: Secondary | ICD-10-CM

## 2017-12-22 DIAGNOSIS — Z825 Family history of asthma and other chronic lower respiratory diseases: Secondary | ICD-10-CM | POA: Diagnosis not present

## 2017-12-22 DIAGNOSIS — I252 Old myocardial infarction: Secondary | ICD-10-CM

## 2017-12-22 DIAGNOSIS — Z952 Presence of prosthetic heart valve: Secondary | ICD-10-CM

## 2017-12-22 DIAGNOSIS — Z9889 Other specified postprocedural states: Secondary | ICD-10-CM

## 2017-12-22 DIAGNOSIS — D692 Other nonthrombocytopenic purpura: Secondary | ICD-10-CM | POA: Diagnosis not present

## 2017-12-22 DIAGNOSIS — Z79899 Other long term (current) drug therapy: Secondary | ICD-10-CM

## 2017-12-22 DIAGNOSIS — I5021 Acute systolic (congestive) heart failure: Secondary | ICD-10-CM | POA: Diagnosis not present

## 2017-12-22 DIAGNOSIS — Z836 Family history of other diseases of the respiratory system: Secondary | ICD-10-CM

## 2017-12-22 DIAGNOSIS — Z812 Family history of tobacco abuse and dependence: Secondary | ICD-10-CM

## 2017-12-22 DIAGNOSIS — K219 Gastro-esophageal reflux disease without esophagitis: Secondary | ICD-10-CM

## 2017-12-22 DIAGNOSIS — Z9989 Dependence on other enabling machines and devices: Secondary | ICD-10-CM

## 2017-12-22 DIAGNOSIS — M660 Rupture of popliteal cyst: Secondary | ICD-10-CM | POA: Diagnosis not present

## 2017-12-22 DIAGNOSIS — Z954 Presence of other heart-valve replacement: Secondary | ICD-10-CM | POA: Diagnosis not present

## 2017-12-22 DIAGNOSIS — G4700 Insomnia, unspecified: Secondary | ICD-10-CM

## 2017-12-22 DIAGNOSIS — J449 Chronic obstructive pulmonary disease, unspecified: Secondary | ICD-10-CM

## 2017-12-22 DIAGNOSIS — Z96652 Presence of left artificial knee joint: Secondary | ICD-10-CM

## 2017-12-22 DIAGNOSIS — G4730 Sleep apnea, unspecified: Secondary | ICD-10-CM | POA: Diagnosis not present

## 2017-12-22 DIAGNOSIS — K760 Fatty (change of) liver, not elsewhere classified: Secondary | ICD-10-CM

## 2017-12-22 DIAGNOSIS — F5109 Other insomnia not due to a substance or known physiological condition: Secondary | ICD-10-CM | POA: Diagnosis not present

## 2017-12-22 DIAGNOSIS — R569 Unspecified convulsions: Secondary | ICD-10-CM | POA: Diagnosis not present

## 2017-12-22 DIAGNOSIS — Z7901 Long term (current) use of anticoagulants: Secondary | ICD-10-CM | POA: Diagnosis not present

## 2017-12-22 DIAGNOSIS — R748 Abnormal levels of other serum enzymes: Secondary | ICD-10-CM

## 2017-12-22 DIAGNOSIS — N179 Acute kidney failure, unspecified: Secondary | ICD-10-CM

## 2017-12-22 DIAGNOSIS — Z7982 Long term (current) use of aspirin: Secondary | ICD-10-CM

## 2017-12-22 DIAGNOSIS — X58XXXA Exposure to other specified factors, initial encounter: Secondary | ICD-10-CM | POA: Diagnosis present

## 2017-12-22 DIAGNOSIS — R609 Edema, unspecified: Secondary | ICD-10-CM

## 2017-12-22 DIAGNOSIS — Z87442 Personal history of urinary calculi: Secondary | ICD-10-CM | POA: Diagnosis not present

## 2017-12-22 DIAGNOSIS — M7989 Other specified soft tissue disorders: Secondary | ICD-10-CM | POA: Diagnosis present

## 2017-12-22 DIAGNOSIS — E119 Type 2 diabetes mellitus without complications: Secondary | ICD-10-CM

## 2017-12-22 DIAGNOSIS — R791 Abnormal coagulation profile: Secondary | ICD-10-CM | POA: Diagnosis not present

## 2017-12-22 DIAGNOSIS — E1165 Type 2 diabetes mellitus with hyperglycemia: Secondary | ICD-10-CM | POA: Diagnosis present

## 2017-12-22 DIAGNOSIS — Z8669 Personal history of other diseases of the nervous system and sense organs: Secondary | ICD-10-CM

## 2017-12-22 DIAGNOSIS — R011 Cardiac murmur, unspecified: Secondary | ICD-10-CM

## 2017-12-22 DIAGNOSIS — I5030 Unspecified diastolic (congestive) heart failure: Secondary | ICD-10-CM | POA: Diagnosis not present

## 2017-12-22 DIAGNOSIS — G3184 Mild cognitive impairment, so stated: Secondary | ICD-10-CM | POA: Diagnosis present

## 2017-12-22 DIAGNOSIS — M79661 Pain in right lower leg: Secondary | ICD-10-CM

## 2017-12-22 DIAGNOSIS — I5033 Acute on chronic diastolic (congestive) heart failure: Principal | ICD-10-CM

## 2017-12-22 DIAGNOSIS — R451 Restlessness and agitation: Secondary | ICD-10-CM | POA: Diagnosis not present

## 2017-12-22 DIAGNOSIS — F17210 Nicotine dependence, cigarettes, uncomplicated: Secondary | ICD-10-CM | POA: Diagnosis present

## 2017-12-22 DIAGNOSIS — D649 Anemia, unspecified: Secondary | ICD-10-CM

## 2017-12-22 DIAGNOSIS — Z960 Presence of urogenital implants: Secondary | ICD-10-CM

## 2017-12-22 DIAGNOSIS — Z885 Allergy status to narcotic agent status: Secondary | ICD-10-CM

## 2017-12-22 DIAGNOSIS — G4733 Obstructive sleep apnea (adult) (pediatric): Secondary | ICD-10-CM

## 2017-12-22 DIAGNOSIS — Z8619 Personal history of other infectious and parasitic diseases: Secondary | ICD-10-CM

## 2017-12-22 DIAGNOSIS — S86811A Strain of other muscle(s) and tendon(s) at lower leg level, right leg, initial encounter: Secondary | ICD-10-CM | POA: Diagnosis present

## 2017-12-22 DIAGNOSIS — M310 Hypersensitivity angiitis: Secondary | ICD-10-CM | POA: Diagnosis not present

## 2017-12-22 DIAGNOSIS — F05 Delirium due to known physiological condition: Secondary | ICD-10-CM | POA: Diagnosis not present

## 2017-12-22 DIAGNOSIS — Z87440 Personal history of urinary (tract) infections: Secondary | ICD-10-CM

## 2017-12-22 DIAGNOSIS — G40909 Epilepsy, unspecified, not intractable, without status epilepticus: Secondary | ICD-10-CM | POA: Diagnosis present

## 2017-12-22 DIAGNOSIS — I959 Hypotension, unspecified: Secondary | ICD-10-CM

## 2017-12-22 DIAGNOSIS — Z743 Need for continuous supervision: Secondary | ICD-10-CM | POA: Diagnosis not present

## 2017-12-22 DIAGNOSIS — F419 Anxiety disorder, unspecified: Secondary | ICD-10-CM | POA: Diagnosis not present

## 2017-12-22 DIAGNOSIS — Z87448 Personal history of other diseases of urinary system: Secondary | ICD-10-CM

## 2017-12-22 DIAGNOSIS — R3129 Other microscopic hematuria: Secondary | ICD-10-CM | POA: Diagnosis not present

## 2017-12-22 DIAGNOSIS — B1920 Unspecified viral hepatitis C without hepatic coma: Secondary | ICD-10-CM | POA: Diagnosis not present

## 2017-12-22 DIAGNOSIS — Z9049 Acquired absence of other specified parts of digestive tract: Secondary | ICD-10-CM

## 2017-12-22 DIAGNOSIS — D891 Cryoglobulinemia: Secondary | ICD-10-CM | POA: Diagnosis not present

## 2017-12-22 LAB — COMPREHENSIVE METABOLIC PANEL
ALT: 45 U/L — ABNORMAL HIGH (ref 0–44)
AST: 55 U/L — ABNORMAL HIGH (ref 15–41)
Albumin: 2.8 g/dL — ABNORMAL LOW (ref 3.5–5.0)
Alkaline Phosphatase: 182 U/L — ABNORMAL HIGH (ref 38–126)
Anion gap: 11 (ref 5–15)
BUN: 34 mg/dL — ABNORMAL HIGH (ref 8–23)
CO2: 21 mmol/L — ABNORMAL LOW (ref 22–32)
Calcium: 8.7 mg/dL — ABNORMAL LOW (ref 8.9–10.3)
Chloride: 101 mmol/L (ref 98–111)
Creatinine, Ser: 1.49 mg/dL — ABNORMAL HIGH (ref 0.61–1.24)
GFR calc Af Amer: 54 mL/min — ABNORMAL LOW (ref >60)
GFR calc non Af Amer: 47 mL/min — ABNORMAL LOW (ref >60)
Glucose, Bld: 263 mg/dL — ABNORMAL HIGH (ref 70–99)
Potassium: 4.8 mmol/L (ref 3.5–5.1)
Sodium: 133 mmol/L — ABNORMAL LOW (ref 135–145)
Total Bilirubin: 1 mg/dL (ref 0.3–1.2)
Total Protein: 7.4 g/dL (ref 6.5–8.1)

## 2017-12-22 LAB — CBC
HCT: 30.8 % — ABNORMAL LOW (ref 39.0–52.0)
HCT: 26.5 % — ABNORMAL LOW (ref 39.0–52.0)
Hemoglobin: 9.8 g/dL — ABNORMAL LOW (ref 13.0–17.0)
Hemoglobin: 8.3 g/dL — ABNORMAL LOW (ref 13.0–17.0)
MCH: 26.3 pg (ref 26.0–34.0)
MCH: 26.5 pg (ref 26.0–34.0)
MCHC: 31.8 g/dL (ref 30.0–36.0)
MCHC: 31.3 g/dL (ref 30.0–36.0)
MCV: 82.6 fL (ref 78.0–100.0)
MCV: 84.7 fL (ref 78.0–100.0)
Platelets: 145 10*3/uL — ABNORMAL LOW (ref 150–400)
Platelets: 141 10*3/uL — ABNORMAL LOW (ref 150–400)
RBC: 3.73 MIL/uL — ABNORMAL LOW (ref 4.22–5.81)
RBC: 3.13 MIL/uL — ABNORMAL LOW (ref 4.22–5.81)
RDW: 14.8 % (ref 11.5–15.5)
RDW: 15.2 % (ref 11.5–15.5)
WBC: 9.7 10*3/uL (ref 4.0–10.5)
WBC: 10.7 10*3/uL — ABNORMAL HIGH (ref 4.0–10.5)

## 2017-12-22 LAB — PROTIME-INR
INR: 2.15
Prothrombin Time: 23.9 seconds — ABNORMAL HIGH (ref 11.4–15.2)

## 2017-12-22 LAB — MAGNESIUM: Magnesium: 1.8 mg/dL (ref 1.7–2.4)

## 2017-12-22 LAB — SAVE SMEAR

## 2017-12-22 LAB — GLUCOSE, CAPILLARY
Glucose-Capillary: 349 mg/dL — ABNORMAL HIGH (ref 70–99)
Glucose-Capillary: 329 mg/dL — ABNORMAL HIGH (ref 70–99)
Glucose-Capillary: 312 mg/dL — ABNORMAL HIGH (ref 70–99)
Glucose-Capillary: 179 mg/dL — ABNORMAL HIGH (ref 70–99)
Glucose-Capillary: 262 mg/dL — ABNORMAL HIGH (ref 70–99)

## 2017-12-22 LAB — CK: Total CK: 75 U/L (ref 49–397)

## 2017-12-22 LAB — PATHOLOGIST SMEAR REVIEW

## 2017-12-22 LAB — HIV ANTIBODY (ROUTINE TESTING W REFLEX): HIV Screen 4th Generation wRfx: NONREACTIVE

## 2017-12-22 LAB — GAMMA GT: GGT: 127 U/L — ABNORMAL HIGH (ref 7–50)

## 2017-12-22 MED ORDER — WARFARIN SODIUM 7.5 MG PO TABS: 7.5000 mg | ORAL_TABLET | ORAL | Status: DC

## 2017-12-22 MED ORDER — INSULIN ASPART 100 UNIT/ML ~~LOC~~ SOLN
10.0000 [IU] | Freq: Once | SUBCUTANEOUS | Status: AC
  Administered 2017-12-22: 10 [IU] via SUBCUTANEOUS

## 2017-12-22 MED ORDER — INSULIN GLARGINE 100 UNIT/ML ~~LOC~~ SOLN
20.0000 [IU] | Freq: Every day | SUBCUTANEOUS | Status: DC
  Administered 2017-12-22: 20 [IU] via SUBCUTANEOUS
  Filled 2017-12-22 (×2): qty 0.2

## 2017-12-22 MED ORDER — ACETAMINOPHEN 325 MG PO TABS
650.0000 mg | ORAL_TABLET | Freq: Four times a day (QID) | ORAL | Status: DC | PRN
  Administered 2017-12-22 – 2017-12-27 (×10): 650 mg via ORAL
  Filled 2017-12-22 (×11): qty 2

## 2017-12-22 MED ORDER — MAGNESIUM SULFATE 50 % IJ SOLN: 1.0000 g | Freq: Once | INTRAMUSCULAR | Status: DC

## 2017-12-22 MED ORDER — NICOTINE 14 MG/24HR TD PT24
14.0000 mg | MEDICATED_PATCH | Freq: Every day | TRANSDERMAL | Status: DC
  Administered 2017-12-22 – 2017-12-27 (×4): 14 mg via TRANSDERMAL
  Filled 2017-12-22 (×6): qty 1

## 2017-12-22 MED ORDER — ATORVASTATIN CALCIUM 10 MG PO TABS
10.0000 mg | ORAL_TABLET | Freq: Every day | ORAL | Status: DC
  Administered 2017-12-22 – 2017-12-27 (×6): 10 mg via ORAL
  Filled 2017-12-22 (×6): qty 1

## 2017-12-22 MED ORDER — WARFARIN SODIUM 5 MG PO TABS: 5.0000 mg | ORAL_TABLET | ORAL | Status: DC

## 2017-12-22 MED ORDER — TRAZODONE HCL 100 MG PO TABS
200.0000 mg | ORAL_TABLET | Freq: Every day | ORAL | Status: DC
  Administered 2017-12-22 – 2017-12-26 (×5): 200 mg via ORAL
  Filled 2017-12-22 (×5): qty 2

## 2017-12-22 MED ORDER — FAMOTIDINE 20 MG PO TABS
20.0000 mg | ORAL_TABLET | Freq: Every day | ORAL | Status: DC
  Administered 2017-12-22 – 2017-12-27 (×6): 20 mg via ORAL
  Filled 2017-12-22 (×6): qty 1

## 2017-12-22 MED ORDER — LEVETIRACETAM 500 MG PO TABS
1000.0000 mg | ORAL_TABLET | Freq: Two times a day (BID) | ORAL | Status: DC
  Administered 2017-12-22 – 2017-12-27 (×12): 1000 mg via ORAL
  Filled 2017-12-22 (×12): qty 2

## 2017-12-22 MED ORDER — IPRATROPIUM-ALBUTEROL 0.5-2.5 (3) MG/3ML IN SOLN
3.0000 mL | Freq: Four times a day (QID) | RESPIRATORY_TRACT | Status: AC
  Administered 2017-12-22 (×2): 3 mL via RESPIRATORY_TRACT
  Filled 2017-12-22 (×2): qty 3

## 2017-12-22 MED ORDER — FUROSEMIDE 10 MG/ML IJ SOLN
20.0000 mg | Freq: Two times a day (BID) | INTRAMUSCULAR | Status: DC
  Administered 2017-12-22 – 2017-12-23 (×3): 20 mg via INTRAVENOUS
  Filled 2017-12-22 (×3): qty 2

## 2017-12-22 MED ORDER — WARFARIN - PHYSICIAN DOSING INPATIENT
Freq: Every day | Status: DC
  Administered 2017-12-22: 18:00:00

## 2017-12-22 MED ORDER — TRAMADOL HCL 50 MG PO TABS
50.0000 mg | ORAL_TABLET | Freq: Once | ORAL | Status: AC
  Administered 2017-12-22: 50 mg via ORAL
  Filled 2017-12-22: qty 1

## 2017-12-22 MED ORDER — SODIUM CHLORIDE 0.9% FLUSH
3.0000 mL | Freq: Two times a day (BID) | INTRAVENOUS | Status: DC
  Administered 2017-12-22 – 2017-12-27 (×12): 3 mL via INTRAVENOUS

## 2017-12-22 MED ORDER — INSULIN ASPART 100 UNIT/ML ~~LOC~~ SOLN
0.0000 [IU] | Freq: Three times a day (TID) | SUBCUTANEOUS | Status: DC
  Administered 2017-12-22: 15 [IU] via SUBCUTANEOUS
  Administered 2017-12-22: 4 [IU] via SUBCUTANEOUS
  Administered 2017-12-22: 15 [IU] via SUBCUTANEOUS
  Administered 2017-12-23 (×2): 7 [IU] via SUBCUTANEOUS
  Administered 2017-12-23 – 2017-12-24 (×2): 3 [IU] via SUBCUTANEOUS
  Administered 2017-12-24 – 2017-12-26 (×6): 4 [IU] via SUBCUTANEOUS
  Administered 2017-12-26: 7 [IU] via SUBCUTANEOUS
  Administered 2017-12-26 – 2017-12-27 (×2): 3 [IU] via SUBCUTANEOUS
  Administered 2017-12-27: 7 [IU] via SUBCUTANEOUS

## 2017-12-22 MED ORDER — ACETAMINOPHEN 650 MG RE SUPP: 650.0000 mg | Freq: Four times a day (QID) | RECTAL | Status: DC | PRN

## 2017-12-22 MED ORDER — MIRTAZAPINE 30 MG PO TABS
30.0000 mg | ORAL_TABLET | Freq: Every day | ORAL | Status: DC
  Administered 2017-12-22 – 2017-12-26 (×5): 30 mg via ORAL
  Filled 2017-12-22: qty 1
  Filled 2017-12-22 (×2): qty 2
  Filled 2017-12-22 (×2): qty 1
  Filled 2017-12-22 (×2): qty 2
  Filled 2017-12-22 (×3): qty 1
  Filled 2017-12-22: qty 2

## 2017-12-22 MED ORDER — MAGNESIUM SULFATE IN D5W 1-5 GM/100ML-% IV SOLN
1.0000 g | Freq: Once | INTRAVENOUS | Status: AC
  Administered 2017-12-22: 1 g via INTRAVENOUS
  Filled 2017-12-22: qty 100

## 2017-12-22 MED ORDER — ASPIRIN EC 81 MG PO TBEC
81.0000 mg | DELAYED_RELEASE_TABLET | Freq: Every day | ORAL | Status: DC
  Administered 2017-12-22 – 2017-12-27 (×6): 81 mg via ORAL
  Filled 2017-12-22 (×6): qty 1

## 2017-12-22 MED ORDER — WARFARIN SODIUM 5 MG PO TABS
5.0000 mg | ORAL_TABLET | Freq: Once | ORAL | Status: AC
  Administered 2017-12-22: 5 mg via ORAL
  Filled 2017-12-22: qty 1

## 2017-12-22 NOTE — Telephone Encounter (Signed)
Returned call to daughter Caryl Pina (DPR on file). Made daughter aware that Dr. Radford Pax will see patient on 10/3 at 9:20 AM. Patient's Home Sleep Study from 06/03/17 and Sleep Study from 07/01/17 Scanned to patient's chart.

## 2017-12-22 NOTE — Progress Notes (Signed)
Preliminary notes--Right lower extremity venous duplex exam completed. Negative for DVT where exam can approachable. Limited study due to patient has very little tolerance for compression, cannot completely exclude the possibility of calf veins thrombosis existence.  A heterogenous non-vascular component seen at medial calf region. Etiology unknown.  Hongying Landry Mellow (RDMS RVT) 12/22/17 7:33 PM

## 2017-12-22 NOTE — Discharge Instructions (Addendum)
Adam Savage,  You came to the hospital for right calf pain. We have screened you and found no blood clots. We found that you have significant fluid overload from your heart failure so we gave you water pills to decrease your swelling. We now believe you are safe to go home. Here are the instructions for you at discharge:  - Please take your Coumadin as prescribed with 5mg  daily except on Sunday and Thursday. We will give you your dose tonight so return to your scheduled dosing starting on Sunday - Please take Invokana (canagliflozin) 100mg  every day for your diabetes with breakfast - Please take Lasix (furosemide) 60mg  every day for your leg swelling - Please use Proventil (albuterol needed) 2 puffs as needed for wheezing - Keep your legs elevated at home - Please follow up with your primary care provider soon after discharge. We recommend you get referral for infectious disease providers when you meet your family doctor.   Thank you for choosing [facility]       Information on my medicine - Coumadin   (Warfarin)  Why was Coumadin prescribed for you? Coumadin was prescribed for you because you have a blood clot or a medical condition that can cause an increased risk of forming blood clots. Blood clots can cause serious health problems by blocking the flow of blood to the heart, lung, or brain. Coumadin can prevent harmful blood clots from forming. As a reminder your indication for Coumadin is:   Blood Clot Prevention After Heart Valve Surgery  What test will check on my response to Coumadin? While on Coumadin (warfarin) you will need to have an INR test regularly to ensure that your dose is keeping you in the desired range. The INR (international normalized ratio) number is calculated from the result of the laboratory test called prothrombin time (PT).  If an INR APPOINTMENT HAS NOT ALREADY BEEN MADE FOR YOU please schedule an appointment to have this lab work done by your health care provider  within 7 days. Your INR goal is usually a number between:  2 to 3 or your provider may give you a more narrow range like 2-2.5.  Ask your health care provider during an office visit what your goal INR is.  What  do you need to  know  About  COUMADIN? Take Coumadin (warfarin) exactly as prescribed by your healthcare provider about the same time each day.  DO NOT stop taking without talking to the doctor who prescribed the medication.  Stopping without other blood clot prevention medication to take the place of Coumadin may increase your risk of developing a new clot or stroke.  Get refills before you run out.  What do you do if you miss a dose? If you miss a dose, take it as soon as you remember on the same day then continue your regularly scheduled regimen the next day.  Do not take two doses of Coumadin at the same time.  Important Safety Information A possible side effect of Coumadin (Warfarin) is an increased risk of bleeding. You should call your healthcare provider right away if you experience any of the following: ? Bleeding from an injury or your nose that does not stop. ? Unusual colored urine (red or dark brown) or unusual colored stools (red or black). ? Unusual bruising for unknown reasons. ? A serious fall or if you hit your head (even if there is no bleeding).  Some foods or medicines interact with Coumadin (warfarin) and might alter your  response to warfarin. To help avoid this: ? Eat a balanced diet, maintaining a consistent amount of Vitamin K. ? Notify your provider about major diet changes you plan to make. ? Avoid alcohol or limit your intake to 1 drink for women and 2 drinks for men per day. (1 drink is 5 oz. wine, 12 oz. beer, or 1.5 oz. liquor.)  Make sure that ANY health care provider who prescribes medication for you knows that you are taking Coumadin (warfarin).  Also make sure the healthcare provider who is monitoring your Coumadin knows when you have started a new  medication including herbals and non-prescription products.  Coumadin (Warfarin)  Major Drug Interactions  Increased Warfarin Effect Decreased Warfarin Effect  Alcohol (large quantities) Antibiotics (esp. Septra/Bactrim, Flagyl, Cipro) Amiodarone (Cordarone) Aspirin (ASA) Cimetidine (Tagamet) Megestrol (Megace) NSAIDs (ibuprofen, naproxen, etc.) Piroxicam (Feldene) Propafenone (Rythmol SR) Propranolol (Inderal) Isoniazid (INH) Posaconazole (Noxafil) Barbiturates (Phenobarbital) Carbamazepine (Tegretol) Chlordiazepoxide (Librium) Cholestyramine (Questran) Griseofulvin Oral Contraceptives Rifampin Sucralfate (Carafate) Vitamin K   Coumadin (Warfarin) Major Herbal Interactions  Increased Warfarin Effect Decreased Warfarin Effect  Garlic Ginseng Ginkgo biloba Coenzyme Q10 Green tea St. Johns wort    Coumadin (Warfarin) FOOD Interactions  Eat a consistent number of servings per week of foods HIGH in Vitamin K (1 serving =  cup)  Collards (cooked, or boiled & drained) Kale (cooked, or boiled & drained) Mustard greens (cooked, or boiled & drained) Parsley *serving size only =  cup Spinach (cooked, or boiled & drained) Swiss chard (cooked, or boiled & drained) Turnip greens (cooked, or boiled & drained)  Eat a consistent number of servings per week of foods MEDIUM-HIGH in Vitamin K (1 serving = 1 cup)  Asparagus (cooked, or boiled & drained) Broccoli (cooked, boiled & drained, or raw & chopped) Brussel sprouts (cooked, or boiled & drained) *serving size only =  cup Lettuce, raw (green leaf, endive, romaine) Spinach, raw Turnip greens, raw & chopped   These websites have more information on Coumadin (warfarin):  FailFactory.se; VeganReport.com.au;

## 2017-12-22 NOTE — Progress Notes (Signed)
Received pt from ED. Alert and oriented x4. Skin intact. Multiple bruising to bil.arms and bil.legs. BLE swelling right greater than left. Oriented to room and call bell.

## 2017-12-22 NOTE — Progress Notes (Signed)
Spoke with patient's daughter on her cell phone who has medical decision making capability for him. 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 His wife had to go to her father's home as he has been getting sicker on August of 2018 and since then, it had become apparent that Mr.Buchholz is unable to care for himself. He moved from [place] to [place] this summer to the daughter's home and she has been having difficulty taking care of him and would like to look into nursing home or assisted living facility for him.  Also she states that he has had prior treatment for his hepatitis C in the past many years ago per daughter (most likely interferon) but no recent treatments as far as she is aware. She also states that he had a bowel resection for recurrent diverticulitis which was fixed. She also states that his bilateral rash only appeared this summer shortly before moving from [place] to [place]. She also states that she would consider changing his primary care provider to be with [facility] internal medicine for continuity of care.

## 2017-12-22 NOTE — Progress Notes (Signed)
Physical Therapy Evaluation Patient Details Name: Adam Savage MRN: [id number] DOB: November 23, 1950 Today's Date: 12/22/2017   History of Present Illness  Pt is a 67 y.o. male presenting with right LE swelling and bruising. PMH is significant for COPD, type 2 diabetes mellitus, CAD, aortic valve replacement.  Clinical Impression  Pt admitted with above diagnosis. Pt currently with functional limitations due to the deficits listed below (see PT Problem List). At the time of evaluation pt presented in hallway ambulating by himself without CAM walker. Assisted pt with donning boot seated in hallway, with improper fit noted. After donning boot pt ambulated with gross min G with RW for safety. Pt demonstrated R knee buckle x1 during gait, but was able to recover without need for assistance as pt was leaning on nursing station. Pt demonstrating decreased safety awareness and aggravation ambulating with CAM walker. Educated pt on importance of donning boot and notifying nursing staff when OOB to maximize safety. RN notified of improper fit and reports he will notify proper personal for adjustments. Pt will benefit from skilled PT to increase their independence and safety with mobility to allow discharge to the venue listed below.       Follow Up Recommendations No PT follow up;Supervision for mobility/OOB    Equipment Recommendations  None recommended by PT    Recommendations for Other Services       Precautions / Restrictions Precautions Precautions: None Required Braces or Orthoses: Other Brace/Splint(CAM walker on RLE) Restrictions Weight Bearing Restrictions: No      Mobility  Bed Mobility Overal bed mobility: Needs Assistance Bed Mobility: Sit to sidelying     Sit to sidelying: Modified independent (Device/Increase time)     General bed mobility comments:Pt required increased time with bed mobility secondary to pain in RLE.  Transfers Overall transfer level: Needs assistance Equipment  used: Rolling walker (2 wheeled) Transfers: Sit to/from Stand Sit to Stand: Min guard;Min assist         General transfer comment: Upon donning cam walker on RLE, pt required min A upon standing as he was in hallway at beginning of session not utilizing any AD. Pt progressed to min G with use of RW upon standing. Pt requiring min cues for hand palcement with descent to bed.  Ambulation/Gait Ambulation/Gait assistance: Min guard Gait Distance (Feet): 200 Feet Assistive device: Rolling walker (2 wheeled) Gait Pattern/deviations: Step-through pattern;Step-to pattern;Decreased step length - right;Decreased stance time - right;Decreased dorsiflexion - right;Decreased weight shift to right;Antalgic Gait velocity: decreased Gait velocity interpretation: <1.31 ft/sec, indicative of household ambulator General Gait Details: Pt noted by himself ambulating in hallway without CAM walker at start of session. Pt reports he had walked 2 other times this AM without CAM walker on as he was unaware he needed to donn when OOB. After donning brace pt demonstrating unsteadiness upon standing, requiring use of RW. Upon further inspection of CAM walker, it demonstrated improper fit as it could not completely fit around R calf muscle. Placed wash clothes anterior to shin to reduce skin irritation from velcro in order to ambulate back to room. Pt required VCs throughout session to remain in close proximity to RW and for upright posture.  Stairs            Wheelchair Mobility    Modified Rankin (Stroke Patients Only)       Balance Overall balance assessment: Mild deficits observed, not formally tested  Pertinent Vitals/Pain Pain Assessment: Faces Faces Pain Scale: Hurts even more Pain Location: At shin due to improper fit from CAM walker Pain Descriptors / Indicators: Grimacing;Sore;Other (Comment)(rubbing) Pain Intervention(s): Limited  activity within patient's tolerance;Monitored during session    Holly expects to be discharged to:: Private residence Living Arrangements: Children Available Help at Discharge: Family;Available 24 hours/day Type of Home: House Home Access: Level entry     Home Layout: One level Home Equipment: Walker - 2 wheels;Cane - single point Additional Comments: Pt lives with 66 y.o. daughter and 50 y.o. granddaughter.    Prior Function Level of Independence: Independent;Independent with assistive device(s)         Comments: Utilized SPC on occassions.      Hand Dominance   Dominant Hand: Right    Extremity/Trunk Assessment   Upper Extremity Assessment Upper Extremity Assessment: Overall WFL for tasks assessed    Lower Extremity Assessment Lower Extremity Assessment: RLE deficits/detail RLE Deficits / Details: s/p RLE swelling,bruising, and pain     Cervical / Trunk Assessment Cervical / Trunk Assessment: Normal  Communication   Communication: No difficulties  Cognition Arousal/Alertness: Awake/alert Behavior During Therapy: Anxious;Impulsive  Overall Cognitive Status: Impaired/different from baseline Area of Impairment: Safety/judgement;Following commands;Memory;Awareness                     Memory: Decreased recall of precautions Following Commands: Follows multi-step commands inconsistently;Follows one step commands consistently;Follows one step commands with increased time Safety/Judgement: Decreased awareness of safety;Decreased awareness of deficits Awareness: Emergent   General Comments: Pt reports he was unaware that he needed to wear boot when OOB. Educated pt multiple times throughout session on benefits of boot.       General Comments  Instructed pt to reposition self in bed in order to elevate RLE to decrease swelling.    Exercises     Assessment/Plan    PT Assessment Patient needs continued PT services  PT Problem List  Decreased strength;Decreased range of motion;Decreased activity tolerance;Decreased balance;Decreased mobility;Decreased coordination;Decreased knowledge of use of DME;Decreased safety awareness;Decreased knowledge of precautions;Pain       PT Treatment Interventions DME instruction;Gait training;Stair training;Functional mobility training;Therapeutic activities;Therapeutic exercise;Patient/family education;Balance training;Modalities    PT Goals (Current goals can be found in the Care Plan section)  Acute Rehab PT Goals Patient Stated Goal: to walk without pain PT Goal Formulation: With patient Time For Goal Achievement: 12/29/17 Potential to Achieve Goals: Good Additional Goals Additional Goal #1: Pt will demonstrate the ability to donn boot in sitting prior to ambulation.    Frequency Min 2X/week   Barriers to discharge        Co-evaluation               AM-PAC PT "6 Clicks" Daily Activity  Outcome Measure Difficulty turning over in bed (including adjusting bedclothes, sheets and blankets)?: A Little Difficulty moving from lying on back to sitting on the side of the bed? : A Little Difficulty sitting down on and standing up from a chair with arms (e.g., wheelchair, bedside commode, etc,.)?: Unable Help needed moving to and from a bed to chair (including a wheelchair)?: A Little Help needed walking in hospital room?: A Little Help needed climbing 3-5 steps with a railing? : A Little 6 Click Score: 16    End of Session Equipment Utilized During Treatment: Gait belt;Other (comment)(CAM walker) Activity Tolerance: Treatment limited secondary to agitation(rubbing of shin from improper fit of boot) Patient left: in bed;with call bell/phone  within reach;with bed alarm set Nurse Communication: Mobility status;Other (comment)(CAM walker adjustments) PT Visit Diagnosis: Other abnormalities of gait and mobility (R26.89);Unsteadiness on feet (R26.81);Pain Pain - Right/Left:  Right Pain - part of body: Leg    Time: 1040-1106 PT Time Calculation (min) (ACUTE ONLY): 26 min   Charges:   PT Evaluation $PT Eval Moderate Complexity: 1 Mod PT Treatments $Gait Training: 8-22 mins        Einar Crow, [place]  Student Physical Therapist Acute Rehab [phone]   Einar Crow 12/22/2017, 1:32 PM

## 2017-12-22 NOTE — Progress Notes (Signed)
Spoke with daughter Caryl Pina to update on care. Patient well, calm and comfortable. No complaints of pain, ready to sleep.

## 2017-12-22 NOTE — Progress Notes (Signed)
RT asked pt if they would like to go on CPAP and pt declined. RT will continue to monitor.

## 2017-12-22 NOTE — Progress Notes (Signed)
Subjective:  Adam Savage is a 67 y.o. with PMH of HFpEF, COPD, T2DM, Aortic Valve replacement, Temporal epilepsy s/p temporal lobectomy, OSA, NSTEMI in 10/2017 admit for acute right leg pain on hospital day 1  Adam Savage was examined and evaluated at bedside this AM.  He states that his leg pain started acutely while walking down one step.  He states that he has had no significant swelling or pain prior to the event.  After his painful sensation he noted significant swelling and ecchymosis around the right calf and ankle.  He states that his pain is currently well managed. Continues to endorse pain at right calf and dorsum of the foot. Still able to ambulate but pain severity increases during walking. Denies any F/N/V/D/C.  Denies any chest pain, palpitations, light-headedness or dizziness.  He states he has chronic dyspnea on exertion after walking about 2 blocks.  However he was able to lay flat at night to sleep.  He also reminds [place] that he has noticed recent weight gain over the last couple days and he does not know the cause of. States he has never been on diuretics in the past.  He also notes that his he was told that his bilateral lower extremity rash were complications of his diabetes.  He states the rash had appeared about 6 months prior. Rash does not cause any pruritus or pain. When confirming his primary care provider he states that he does not have one in the area.  He requests that we check in with his daughter who keeps meticulous records of his medical appointments.   Objective:  Vital signs in last 24 hours: Vitals:   12/21/17 2331 12/22/17 0341 12/22/17 0637 12/22/17 0737  BP: (!) 118/32 105/61    Pulse: 79 90    Resp:  20    Temp:  97.7 F (36.5 C)    TempSrc:  Oral    SpO2: 97% 97%  97%  Weight:   129.3 kg   Height:       Physical Exam  Constitutional: He is oriented to person, place, and time and well-developed, well-nourished, and in no distress. No distress.  HENT:    Head: Normocephalic and atraumatic.  Mouth/Throat: Oropharynx is clear and moist. No oropharyngeal exudate.  Eyes: Pupils are equal, round, and reactive to light. Conjunctivae and EOM are normal. No scleral icterus.  Neck: Normal range of motion. Neck supple. No JVD present.  Cardiovascular: Normal rate and regular rhythm.  Loud S1  Pulmonary/Chest: Effort normal. He has rales (bibasilar rales ).  Abdominal: Soft. Bowel sounds are normal.  Musculoskeletal: Normal range of motion. He exhibits edema (2+ pitting edema left side up to knees. Non-pitting edema on right lower extremity with multiple areas of ecchymosis around shin and ankle.) and tenderness (Tenderness to palpation around right calf. ).  Neurological: He is alert and oriented to person, place, and time. No cranial nerve deficit.  Skin: Skin is warm and dry. Rash (palpable purpura bilaterally) noted. He is not diaphoretic.  Psychiatric: Mood, affect and judgment normal.  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 Most likely baker's cyst rupture with diffuse ecchymosis and localized pain on right calf. Was on anticoagulant so low likelihood of DVT. CK was negative for myalgia. Also examines fluid overloaded.  Will diuresis considering history of HFpEF. Also having significant hyperglycemia. Will need to optimize diabetic regimen during admission with close f/u on discharge.  Acute Right Calf Pain 2/2 Baker's Cyst rupture vs Gastrocnemius tear vs DVT Pain well controlled. Diffuse lower extremity ecchymosis and significant swelling on exam. Most likely Baker's Cyst rupture  INR on 2.43 on Coumadin. CK 75 - F/U right lower extremity ultrasound - Trend CBC - Fall precautions  Diastolic Heart Failure with EF 55% 7/19 Reports of daily  weight with dry weight 275lbs. Currently 285lbs. 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 May also be warfarin induced vasculitis although timeline does not match up. Cannot d/c warfarin due to mechanical aortic valve. UA show proteinuria and hematuria - Get labs: Hep C Quant, ANCA, Cryoglobulin  T2DM A1c 8.3 11/02/17 No home meds. Will optimize diabetic regimen before discharge - Start Lantus 20 units qhs - Sliding scale aspart  OSA - CPAP at night  COPD Received 125mg  solumedrol in ED. Currently satting 97 on RA.  - C/w Duonebs q6hrs  Hx of Temporal Lobe Epilepsy s/p lobectomy - C/w Home meds: Keppra 1000mg  BID, Trazodone 200mg  qhs, Remeron 30mg  qhs  Hx of Aortic valve replacement - C/w home med: Coumadin 5mg  MTWFSa, 7mg  Th,Su  Hx of NSTEMi - C/w home meds: Asa 81mg  daily, Atorvastatin 10mg  daily  DVT ppx: Home Coumadin Bowel: Pepcid 20mg  daily Diet: Diabetic/HF diet w/ fluid restriction Code: FULL code  Dispo: Anticipated discharge in approximately 2 day(s).   [name], MD 12/22/2017, 10:25 AM Pager: [phone]

## 2017-12-23 ENCOUNTER — Other Ambulatory Visit: Payer: Self-pay | Admitting: Internal Medicine

## 2017-12-23 DIAGNOSIS — I214 Non-ST elevation (NSTEMI) myocardial infarction: Secondary | ICD-10-CM

## 2017-12-23 DIAGNOSIS — Z952 Presence of prosthetic heart valve: Secondary | ICD-10-CM

## 2017-12-23 DIAGNOSIS — R791 Abnormal coagulation profile: Secondary | ICD-10-CM

## 2017-12-23 DIAGNOSIS — I38 Endocarditis, valve unspecified: Secondary | ICD-10-CM

## 2017-12-23 DIAGNOSIS — Z5181 Encounter for therapeutic drug level monitoring: Secondary | ICD-10-CM

## 2017-12-23 DIAGNOSIS — I5030 Unspecified diastolic (congestive) heart failure: Secondary | ICD-10-CM

## 2017-12-23 LAB — ANCA TITERS
Atypical P-ANCA titer: <1:20 {titer}
C-ANCA: <1:20 {titer}
P-ANCA: <1:20 {titer}

## 2017-12-23 LAB — GLUCOSE, CAPILLARY
Glucose-Capillary: 150 mg/dL — ABNORMAL HIGH (ref 70–99)
Glucose-Capillary: 237 mg/dL — ABNORMAL HIGH (ref 70–99)
Glucose-Capillary: 216 mg/dL — ABNORMAL HIGH (ref 70–99)
Glucose-Capillary: 217 mg/dL — ABNORMAL HIGH (ref 70–99)

## 2017-12-23 LAB — MPO/PR-3 (ANCA) ANTIBODIES
ANCA Proteinase 3: <3.5 U/mL (ref 0.0–3.5)
Myeloperoxidase Abs: <9 U/mL (ref 0.0–9.0)

## 2017-12-23 LAB — HCV RNA QUANT
HCV Quantitative Log: 5.386 log10 IU/mL (ref >1.70)
HCV Quantitative: 243000 IU/mL (ref >50)

## 2017-12-23 LAB — CBC
HCT: 31.2 % — ABNORMAL LOW (ref 39.0–52.0)
Hemoglobin: 10 g/dL — ABNORMAL LOW (ref 13.0–17.0)
MCH: 26.1 pg (ref 26.0–34.0)
MCHC: 32.1 g/dL (ref 30.0–36.0)
MCV: 81.5 fL (ref 78.0–100.0)
Platelets: 183 10*3/uL (ref 150–400)
RBC: 3.83 MIL/uL — ABNORMAL LOW (ref 4.22–5.81)
RDW: 14.9 % (ref 11.5–15.5)
WBC: 11.3 10*3/uL — ABNORMAL HIGH (ref 4.0–10.5)

## 2017-12-23 LAB — COMPREHENSIVE METABOLIC PANEL
ALT: 40 U/L (ref 0–44)
AST: 57 U/L — ABNORMAL HIGH (ref 15–41)
Albumin: 2.9 g/dL — ABNORMAL LOW (ref 3.5–5.0)
Alkaline Phosphatase: 165 U/L — ABNORMAL HIGH (ref 38–126)
Anion gap: 9 (ref 5–15)
BUN: 34 mg/dL — ABNORMAL HIGH (ref 8–23)
CO2: 24 mmol/L (ref 22–32)
Calcium: 8 mg/dL — ABNORMAL LOW (ref 8.9–10.3)
Chloride: 99 mmol/L (ref 98–111)
Creatinine, Ser: 1.5 mg/dL — ABNORMAL HIGH (ref 0.61–1.24)
GFR calc Af Amer: 54 mL/min — ABNORMAL LOW (ref >60)
GFR calc non Af Amer: 46 mL/min — ABNORMAL LOW (ref >60)
Glucose, Bld: 153 mg/dL — ABNORMAL HIGH (ref 70–99)
Potassium: 4.1 mmol/L (ref 3.5–5.1)
Sodium: 132 mmol/L — ABNORMAL LOW (ref 135–145)
Total Bilirubin: 1 mg/dL (ref 0.3–1.2)
Total Protein: 6.8 g/dL (ref 6.5–8.1)

## 2017-12-23 LAB — PROTIME-INR
INR: 1.83
Prothrombin Time: 21 seconds — ABNORMAL HIGH (ref 11.4–15.2)

## 2017-12-23 MED ORDER — FUROSEMIDE 10 MG/ML IJ SOLN
60.0000 mg | Freq: Two times a day (BID) | INTRAMUSCULAR | Status: DC
  Administered 2017-12-23 – 2017-12-25 (×5): 60 mg via INTRAVENOUS
  Filled 2017-12-23 (×5): qty 6

## 2017-12-23 MED ORDER — WARFARIN - PHARMACIST DOSING INPATIENT
Freq: Every day | Status: DC
  Administered 2017-12-25 – 2017-12-27 (×2)

## 2017-12-23 MED ORDER — ENOXAPARIN SODIUM 120 MG/0.8ML ~~LOC~~ SOLN
120.0000 mg | Freq: Two times a day (BID) | SUBCUTANEOUS | Status: DC
  Administered 2017-12-24: 120 mg via SUBCUTANEOUS
  Filled 2017-12-23 (×5): qty 0.8

## 2017-12-23 MED ORDER — IPRATROPIUM-ALBUTEROL 0.5-2.5 (3) MG/3ML IN SOLN
3.0000 mL | Freq: Four times a day (QID) | RESPIRATORY_TRACT | Status: DC
  Administered 2017-12-23 – 2017-12-26 (×12): 3 mL via RESPIRATORY_TRACT
  Filled 2017-12-23 (×13): qty 3

## 2017-12-23 MED ORDER — WARFARIN SODIUM 7.5 MG PO TABS
7.5000 mg | ORAL_TABLET | Freq: Once | ORAL | Status: AC
  Administered 2017-12-23: 7.5 mg via ORAL
  Filled 2017-12-23: qty 1

## 2017-12-23 MED ORDER — INSULIN GLARGINE 100 UNIT/ML ~~LOC~~ SOLN
30.0000 [IU] | Freq: Every day | SUBCUTANEOUS | Status: DC
  Filled 2017-12-23: qty 0.3

## 2017-12-23 MED ORDER — INSULIN GLARGINE 100 UNIT/ML ~~LOC~~ SOLN
25.0000 [IU] | Freq: Every day | SUBCUTANEOUS | Status: DC
  Administered 2017-12-23 – 2017-12-26 (×4): 25 [IU] via SUBCUTANEOUS
  Filled 2017-12-23 (×5): qty 0.25

## 2017-12-23 MED ORDER — LIDOCAINE HCL (PF) 1 % IJ SOLN
INTRAMUSCULAR | Status: AC
  Filled 2017-12-23: qty 30

## 2017-12-23 NOTE — Plan of Care (Signed)
  Problem: Activity: Goal: Risk for activity intolerance will decrease Outcome: Progressing   Problem: Nutrition: Goal: Adequate nutrition will be maintained Outcome: Progressing   

## 2017-12-23 NOTE — Progress Notes (Signed)
RT set up pts home CPAP machine for him. Inspected it for any frayed wires/cords, cleanliness, workability.[employer] will inspect machine for approval to use in hospital. Pt machine in good working order. Pt placed on CPAP for the night on his normal setting of 8cmH2O. Pt resting comfortably. RT will continue to monitor.

## 2017-12-23 NOTE — Progress Notes (Signed)
  Date: 12/23/2017  Patient name: Adam Savage  Medical record number: [id number]  Date of birth: Jun 23, 1950   I have seen and evaluated this patient and I have discussed the plan of care with the house staff. Please see their note for complete details. I concur with their findings with the following additions/corrections: Mr Balke was seen on AM rounds with team and again in PM when Dr Truman Hayward performed a punch bx of L lower leg. I was present during his procedure.   1. R leg pain and swelling - clinical dx is ruptured baker's cyst. Will heal with time.  2. Acute on chronic diastolic heart failure - no diuresis yesterday on IV lasix 20 BID. Increase dose. Weight goal 271.  3. Suspected vasculitis - punch bx today by Dr Truman Hayward. Await serologic W/U  And path  4. Microscopic hematuria - await vasculitis W/U. Might need outpt urology  Bartholomew Crews, MD 12/23/2017, 4:01 PM

## 2017-12-23 NOTE — Procedures (Addendum)
Skin Punch Biopsy Supervising Attending: Dr.Butcher Indication: For diagnosis of vasculitis  After informed written consent was obtained, using Chlorhexidine wipe for cleansing and 1% Lidocaine for anesthetic, with sterile technique a 4 mm punch biopsy was used to obtain a biopsy specimen of the lesion. Hemostasis was obtained by pressure and wound was covered with folded 4x4 bandaging and tape. Be alert for any signs of cutaneous infection. The specimen is labeled and sent to pathology for evaluation. The procedure was well tolerated without complications.  -Adam Savage 12/23/2017 4:00PM Pager: [phone]

## 2017-12-23 NOTE — NC FL2 (Signed)
[place] MEDICAID FL2 LEVEL OF CARE SCREENING TOOL     IDENTIFICATION  Patient Name: Adam Savage Birthdate: October 12, 1950 Sex: male Admission Date (Current Location): 12/21/2017  [facility] and [place] Number:  Herbalist and Address:  [facility] [place], [place] [address], [place], [place] [address]      Provider Number: [id number]  Attending Physician Name and Address:  Bartholomew Crews, MD  Relative Name and Phone Number:  [name]; daughter; [phone]    Current Level of Care: Hospital Recommended Level of Care: [place] Prior Approval Number:    Date Approved/Denied:   PASRR Number: [id number]  Discharge Plan: SNF    Current Diagnoses: Patient Active Problem List   Diagnosis Date Noted  . 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 Leg swelling 12/22/2017  . Encounter for therapeutic drug monitoring 11/06/2017  . Non-ST elevation (NSTEMI) myocardial infarction ([place])   . Anticoagulant long-term use   . Unstable angina ([place]) 10/29/2017  . Chest pain 10/28/2017  . Seizures ([place])   . COPD (chronic obstructive pulmonary disease) ([place])   . Pre-diabetes   . Overdose of muscle relaxant 08/11/2017  . Folic acid deficiency anemia 06/20/2016  . Obesity 09/21/2015  . Fatty liver disease, nonalcoholic 87/86/7672  . Elevated liver enzymes 07/28/2015  . Osteoarthritis 07/28/2015  . OSA (obstructive sleep apnea) 05/05/2015  . Joint pain 11/12/2013  . Tension headache, chronic 08/25/2013  . Status post lobectomy of brain 06/28/2013  . Epilepsy ([place]) 11/03/2012  . H/O diverticulitis of colon 11/03/2012  . Mechanical heart valve present 09/05/2012    Orientation RESPIRATION BLADDER Height & Weight     Self, Place, Time, Situation  Normal Continent Weight: 282 lb 3 oz (128 kg) Height:  6' (182.9 cm)  BEHAVIORAL SYMPTOMS/MOOD NEUROLOGICAL BOWEL NUTRITION STATUS      Continent Diet  AMBULATORY STATUS  COMMUNICATION OF NEEDS Skin   Limited Assist Verbally Normal                       Personal Care Assistance Level of Assistance  Bathing, Feeding, Dressing Bathing Assistance: Limited assistance Feeding assistance: Independent Dressing Assistance: Limited assistance     Functional Limitations Info  Hearing   Hearing Info: Impaired      SPECIAL CARE FACTORS FREQUENCY  PT (By licensed PT)     PT Frequency: 5x/week              Contractures Contractures Info: Not present    Additional Factors Info  Allergies, Code Status, Insulin Sliding Scale Code Status Info: full Allergies Info: Morphine And Related   Insulin Sliding Scale Info: 3x/day with meals and at bedtime       Current Medications (12/23/2017):  This is the current hospital active medication list Current Facility-Administered Medications  Medication Dose Route Frequency Provider Last Rate Last Dose  . acetaminophen (TYLENOL) tablet 650 mg  650 mg Oral Q6H PRN Welford Roche, MD   650 mg at 12/23/17 0814   Or  . acetaminophen (TYLENOL) suppository 650 mg  650 mg Rectal Q6H PRN Santos-Sanchez, Merlene Morse, MD      . aspirin EC tablet 81 mg  81 mg Oral Daily Santos-Sanchez, Idalys, MD   81 mg at 12/23/17 0815  . atorvastatin (LIPITOR) tablet 10 mg  10 mg Oral Daily Welford Roche, MD   10 mg at 12/23/17 0814  . enoxaparin (LOVENOX) injection 120 mg  120 mg Subcutaneous Q12H  [name], MD      . famotidine (PEPCID) tablet 20 mg  20 mg Oral Daily Santos-Sanchez, Idalys, MD   20 mg at 12/23/17 0814  . furosemide (LASIX) injection 60 mg  60 mg Intravenous BID [name], MD      . insulin aspart (novoLOG) injection 0-20 Units  0-20 Units Subcutaneous TID WC Welford Roche, MD   7 Units at 12/23/17 1250  . insulin glargine (LANTUS) injection 30 Units  30 Units Subcutaneous QHS [name], MD      . ipratropium-albuterol (DUONEB) 0.5-2.5 (3) MG/3ML nebulizer solution 3 mL  3 mL  Nebulization Q6H Agyei, Obed K, MD   3 mL at 12/23/17 1422  . levETIRAcetam (KEPPRA) tablet 1,000 mg  1,000 mg Oral BID Welford Roche, MD   1,000 mg at 12/23/17 0814  . lidocaine (PF) (XYLOCAINE) 1 % injection           . mirtazapine (REMERON) tablet 30 mg  30 mg Oral QHS Santos-Sanchez, Idalys, MD   30 mg at 12/22/17 2117  . nicotine (NICODERM CQ - dosed in mg/24 hours) patch 14 mg  14 mg Transdermal Daily Welford Roche, MD   14 mg at 12/22/17 0241  . sodium chloride flush (NS) 0.9 % injection 3 mL  3 mL Intravenous Q12H Welford Roche, MD   3 mL at 12/23/17 0818  . traZODone (DESYREL) tablet 200 mg  200 mg Oral QHS Welford Roche, MD   200 mg at 12/22/17 2117  . warfarin (COUMADIN) tablet 7.5 mg  7.5 mg Oral ONCE-1800 Franky Macho, RPH      . Warfarin - Pharmacist Dosing Inpatient   Does not apply [place], [facility]         Discharge Medications: Please see discharge summary for a list of discharge medications.  Relevant Imaging Results:  Relevant Lab Results:   Additional Information SSN: [id number]  Estanislado Emms, LCSW

## 2017-12-23 NOTE — Clinical Social Work Note (Signed)
Clinical Social Work Assessment  Patient Details  Name: Adam Savage MRN: [id number] Date of Birth: 24-Jul-1950  Date of referral:                  Reason for consult:  Facility Placement, Discharge Planning                Permission sought to share information with:  Family Supports, Customer service manager Permission granted to share information::  No  Name::      Adam Savage  Agency::   SNFs  Relationship::   daughter  Contact Information:   [phone]  Housing/Transportation Living arrangements for the past 2 months:  [place] of Information:  Adult Children Patient Interpreter Needed:  None Criminal Activity/Legal Involvement Pertinent to Current Situation/Hospitalization:  No - Comment as needed Significant Relationships:  Adult Children, Other Family Members Lives with:  Adult Children Do you feel safe going back to the place where you live?  No Need for family participation in patient care:  Yes (Comment)  Care giving concerns:  Pt has been having difficulty with ADLs and IADLs, as well as endurance doing thing such as ambulating and getting safely around the home. There is concern with pt being home alone during the day as pt family work and are unable to provide 24/7 assistance. Pt has had some changes in mood and memory whicch also have taken a toll on family. Pt family requesting placement for pt.    Social Worker assessment / plan:  CSW spoke with pt daughter via telephone, pt alert but often confused per RN report. Pt daughter states pt had been living with his spouse in [place]. Pt had lobectomy in 2015 and has had progressive changes in memory and mood. Pt daughter states that she had been traveling back and forth to [place] to attempt to help pt with care as well as making sure that he was safe. Pt wife "left him" due to pt changes in personality and therefore pt daughter moved pt into her home. Pt daughter states although she and  her husband have experience with providing care and have experience working in the group home setting they dont feel as though they can provide the 24/7 support and assistance that the pt needs currently.   Pt daughter understands limitations of recommendations and what Medicare can cover. CSW explained that some SNFs may not be able to take pt due to lack of skillable diagnosis and current functional status (pt ambuating 250+ft). If unable to place pt in SNF will have to arrange home health along with information to an assisted living assistant to help pt family with placement as we are unable to place pt into LTC ALF setting from hospital. Pt daughter states understanding.   Employment status:  Retired Forensic scientist:  [employer] PT Recommendations:  [place], Home with [place] / Referral to community resources:  [place]  Patient/Family's Response to care:  Pt family understanding of CSW role, limitations, and current recommendations.   Patient/Family's Understanding of and Emotional Response to Diagnosis, Current Treatment, and Prognosis:  Pt daughter states understanding of diagnosis, current treatment and prognosis. Pt daughter does have some questions regarding nature of pt's mood swings and memory loss and is concerned about pt's ability to function much longer in her home without appropriate services. Pt daughter states frustration and sadness about changes in pt and that she feels she is no longer able to keep pt  safe and give him the best care that he needs at home. Pt daughter expresses appropriate expectations for pt progress, placement options, and CSW support.  Emotional Assessment Appearance:  Appears stated age Attitude/Demeanor/Rapport:  Unable to Assess Affect (typically observed):  Unable to Assess Orientation:  Oriented to Self, Oriented to Place, Fluctuating Orientation (Suspected and/or reported Sundowners) Alcohol / Substance  use:  Not Applicable Psych involvement (Current and /or in the community):  No (Comment)  Discharge Needs  Concerns to be addressed:  Care Coordination Readmission within the last 30 days:  No Current discharge risk:  Cognitively Impaired, Physical Impairment Barriers to Discharge:  Awaiting State Approval Tour manager), Continued Medical Work up   [employer], [place] 12/23/2017, 3:17 PM

## 2017-12-23 NOTE — Progress Notes (Signed)
Physical Therapy Treatment Patient Details Name: Adam Savage MRN: [id number] DOB: 07-07-1950 Today's Date: 12/23/2017    History of Present Illness Pt is a 67 y.o. male presenting with right LE swelling and bruising. PMH is significant for COPD, type 2 diabetes mellitus, CAD, aortic valve replacement.    PT Comments    Pt continues to be limited by pain in LE's (pt reports bilateral calf pain, R>L) and general weakness. Noted CAM boot unchanged from yesterday and continues to be an improper fit. RN reports pt has been ambulating well throughout the day in room and in hall without boot donned or AD, so left boot doffed during session. Noted daughter is reporting difficulty caring for the pt at home, and is interested in ALF at d/c. Feel this is reasonable as pt with cognitive deficits impacting higher level processing, safety, and completion of IADL's. This patient would benefit from the supervision available at ALF to decrease caregiver burden, as well as continued skilled physical therapy services (HHPT) to maximize functional independence and safety at d/c.      Follow Up Recommendations  Home health PT;Supervision for mobility/OOB;Other (comment)(ALF)     Equipment Recommendations  None recommended by PT    Recommendations for Other Services       Precautions / Restrictions Precautions Precautions: None Restrictions Weight Bearing Restrictions: No    Mobility  Bed Mobility Overal bed mobility: Needs Assistance Bed Mobility: Supine to Sit;Sit to Supine     Supine to sit: Modified independent (Device/Increase time) Sit to supine: Modified independent (Device/Increase time)   General bed mobility comments: Min cues for safety. Increased time required secondary to swelling and pain in RLE. Reports pain in LLE at time of session as well.  Transfers Overall transfer level: Needs assistance Equipment used: None Transfers: Sit to/from Stand Sit to Stand: Modified independent  (Device/Increase time)         General transfer comment: Pt Mod I for sit<>stand without CAM walker donned. Demonstrates safe hand placement without the need for cues.  Ambulation/Gait Ambulation/Gait assistance: Min guard Gait Distance (Feet): 250 Feet Assistive device: None Gait Pattern/deviations: Step-through pattern;Decreased step length - right;Decreased stance time - right Gait velocity: decreased Gait velocity interpretation: <1.31 ft/sec, indicative of household ambulator General Gait Details: Pt ambulating with Min G for safety without CAM walker donned. Pt reporting "pulling" in bil calfs with gait. Pt demonstrating LOB x1 ambulating to BR, but able to stabilize self without need for assistance.    Stairs             Wheelchair Mobility    Modified Rankin (Stroke Patients Only)       Balance Overall balance assessment: Mild deficits observed, not formally tested                                          Cognition Arousal/Alertness: Awake/alert Behavior During Therapy: Impulsive;Anxious Overall Cognitive Status: Impaired/Different from baseline Area of Impairment: Safety/judgement;Following commands;Memory;Awareness                     Memory: Decreased recall of precautions;Decreased short-term memory Following Commands: Follows multi-step commands inconsistently;Follows one step commands consistently;Follows one step commands with increased time Safety/Judgement: Decreased awareness of safety;Decreased awareness of deficits Awareness: Emergent   General Comments: Pt disoriented x4. Pt reporting he wants to use urinal for toileting but requests to go to bathroom  to use it. Easily distracted throughout session. Noted by MD, pt is s/p temporal lobectomy in 2015.      Exercises General Exercises - Lower Extremity Ankle Circles/Pumps: AROM;Both;10 reps Long Arc Quad: AROM;10 reps;Right Other Exercises Other Exercises: Gastroc  stretch - seated with towel (bil LE 2x30sec)    General Comments        Pertinent Vitals/Pain Pain Assessment: Faces Faces Pain Scale: Hurts little more Pain Location: bil Calf muscle Pain Descriptors / Indicators: Discomfort;Tightness;Throbbing Pain Intervention(s): Limited activity within patient's tolerance;Monitored during session    Home Living                      Prior Function            PT Goals (current goals can now be found in the care plan section) Acute Rehab PT Goals Patient Stated Goal: to walk without pain PT Goal Formulation: With patient Time For Goal Achievement: 12/29/17 Potential to Achieve Goals: Good Progress towards PT goals: Progressing toward goals    Frequency    Min 3X/week      PT Plan Discharge plan needs to be updated;Frequency needs to be updated    Co-evaluation              AM-PAC PT "6 Clicks" Daily Activity  Outcome Measure  Difficulty turning over in bed (including adjusting bedclothes, sheets and blankets)?: None Difficulty moving from lying on back to sitting on the side of the bed? : None Difficulty sitting down on and standing up from a chair with arms (e.g., wheelchair, bedside commode, etc,.)?: A Little Help needed moving to and from a bed to chair (including a wheelchair)?: A Little Help needed walking in hospital room?: A Little Help needed climbing 3-5 steps with a railing? : A Little 6 Click Score: 20    End of Session Equipment Utilized During Treatment: Gait belt Activity Tolerance: Patient tolerated treatment well Patient left: in bed;with call bell/phone within reach Nurse Communication: Mobility status PT Visit Diagnosis: Other abnormalities of gait and mobility (R26.89);Unsteadiness on feet (R26.81);Pain Pain - Right/Left: Right Pain - part of body: Leg     Time: 4388-8757 PT Time Calculation (min) (ACUTE ONLY): 31 min  Charges:  $Gait Training: 23-37 mins                     Einar Crow, [place]  Student Physical Therapist Acute Rehab [phone]    Einar Crow 12/23/2017, 2:51 PM

## 2017-12-23 NOTE — Progress Notes (Signed)
Subjective:  Adam Savage is a 67 y.o. with PMH of HFpEF, COPD, OSA, T2DM, Aortic Valve Replacement, Temporal lobe epilepsy s/p lobectomy admit for right lower extremity swelling on hospital day 1  Adam Savage was examined and evaluated at bedside this AM. He was observed walking around his room. He states he had a rough time sleeping last night with frequent awakenings. He has a history of obstructive sleep apnea with CPAP at home. He states using the CPAP significantly improves his sleep but he has trouble using it as the irritation around his nasal sinuses bothers him. Has not yet noticed any significant changes to his swelling. Able to ambulate without the use of CAM walker. Denies any F/N/V/D/C  Objective:  Vital signs in last 24 hours: Vitals:   12/23/17 0921 12/23/17 1023 12/23/17 1423 12/23/17 1427  BP:    120/64  Pulse:    79  Resp:      Temp:    98.3 F (36.8 C)  TempSrc:    Oral  SpO2:  98% 96% 97%  Weight: 128 kg     Height:       Physical Exam  Constitutional: He is oriented to person, place, and time and well-developed, well-nourished, and in no distress. No distress.  HENT:  Head: Normocephalic and atraumatic.  Mouth/Throat: Oropharynx is clear and moist.  Eyes: Pupils are equal, round, and reactive to light. Conjunctivae and EOM are normal. No scleral icterus.  Neck: Normal range of motion. Neck supple.  Cardiovascular: Normal rate, regular rhythm, normal heart sounds and intact distal pulses.  Pulmonary/Chest: He is in respiratory distress (labored breathing). He has wheezes (end-expiratory wheezes).  Increased respiratory effort  Abdominal: Soft. Bowel sounds are normal.  Musculoskeletal: Normal range of motion. He exhibits edema (2+ pitting edema left lower extremity. Non-pitting edema right lower extremity) and tenderness (Right medial calf midly tender to palpation).  Neurological: He is alert and oriented to person, place, and time. No cranial nerve deficit.    Skin: Skin is warm and dry. Rash (Bilateral lower extremity palpable purpura) noted. He is not diaphoretic.  Psychiatric: Mood, memory, affect and judgment normal.     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 DVT ruled out. Baker's Cyst rupture most likely. PT recommend ALF on discharge. His daughter states that he is unable to care for himself at home 2/2 personality changes since temporal lobectomy. Requesting OT eval. MMSE today show 26/30 (mild cognitive impairment) Will need to reassess best way to perform safe discharge if he is unable to take his medication at home as his daughter claims.  Acute Right Calf Pain 2/2 Baker's Cyst rupture Right lower extremity ultrasound show no DVT but heterogenous non-vascular structure at medial calf. Most likely ruptured baker's cyst. WBC 11.3 - F/U right lower extremity ultrasound - Trend CBC - Fall precautions  Diastolic Heart Failure with EF 55% 7/19 Reports of daily weight with dry weight 275lbs. Lost 3 pounds (282<-285). Urine output 1.15L Will attempt to get closer to dry weight before discharge. 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 Started on Lantus 20 units qhs yesterday. Fasting this AM 153 - Increase to Lantus 25 units qhs - Sliding scale aspart  OSA Spoke with pt's daughter. She states she will bring his CPAP machine although he is doubtful that he will use it successfully due to his  history of non-adherence - CPAP at night  COPD Currently satting 97 on RA.  - C/w Duonebs q6hrs  Hx of Temporal Lobe Epilepsy s/p lobectomy - C/w Home meds: Keppra 1000mg  BID, Trazodone 200mg  qhs, Remeron 30mg  qhs  Hx of Aortic valve replacement INR subtherapeutic this AM at 1.83. Will need to bridge - Lovenox 120mg  BID for bridging - Warfarin dosing per pharmacy   Hx of NSTEMi - C/w home meds: Asa 81mg  daily, Atorvastatin 10mg  daily  DVT ppx: Home Coumadin Bowel: Pepcid 20mg  daily Diet: Diabetic/HF diet w/ fluid restriction Code: FULL code  Dispo: Anticipated discharge in approximately 2 day(s).   Adam Anis, MD 12/23/2017, 3:11 PM Pager: [phone]

## 2017-12-23 NOTE — Progress Notes (Signed)
ANTICOAGULATION CONSULT NOTE - Initial Consult  Pharmacy Consult for Coumadin Indication: mechanical valve (aortic valve)  Allergies  Allergen Reactions  . Morphine And Related Other (See Comments)    Combative     Patient Measurements: Height: 6' (182.9 cm) Weight: 282 lb 3 oz (128 kg) IBW/kg (Calculated) : 77.6  Vital Signs: Temp: 98.3 F (36.8 C) (09/10 1427) Temp Source: Oral (09/10 1427) BP: 120/64 (09/10 1427) Pulse Rate: 79 (09/10 1427)  Labs: Recent Labs    12/21/17 1953 12/21/17 1954 12/22/17 0212 12/22/17 0213 12/22/17 1504 12/23/17 0529  HGB 9.1*  --   --  9.8* 8.3* 10.0*  HCT 28.7*  --   --  30.8* 26.5* 31.2*  PLT 127*  --   --  145* 141* 183  LABPROT  --  26.2*  --  23.9*  --  21.0*  INR  --  2.43  --  2.15  --  1.83  CREATININE 1.46*  --   --  1.49*  --  1.50*  CKTOTAL  --   --  75  --   --   --     Estimated Creatinine Clearance: 66.1 mL/min (A) (by C-G formula based on SCr of 1.5 mg/dL (H)).   Medical History: Past Medical History:  Diagnosis Date  . COPD (chronic obstructive pulmonary disease) ([place])   . Diabetes mellitus without complication ([place])   . Mechanical heart valve present   . Pre-diabetes   . Seizures ([place])     Medications:  Medications Prior to Admission  Medication Sig Dispense Refill Last Dose  . acetaminophen (TYLENOL) 650 MG CR tablet Take 1,300 mg by mouth every 8 (eight) hours as needed for pain.   12/21/2017 at 1630  . aspirin EC 81 MG tablet Take 81 mg by mouth daily.   12/21/2017 at am  . atorvastatin (LIPITOR) 10 MG tablet Take 1 tablet (10 mg total) by mouth daily. 30 tablet 0 12/21/2017 at am  . carvedilol (COREG) 6.25 MG tablet Take 1 tablet (6.25 mg total) by mouth 2 (two) times daily with a meal. 180 tablet 3 12/21/2017 at 900  . levETIRAcetam (KEPPRA) 1000 MG tablet Take 1 tablet (1,000 mg total) by mouth 2 (two) times daily. 180 tablet 3 12/21/2017 at am  . mirtazapine (REMERON) 30 MG tablet Take 30 mg by mouth at  bedtime.  10 12/20/2017 at pm  . ramipril (ALTACE) 5 MG capsule Take 1 capsule (5 mg total) by mouth daily. 90 capsule 3 12/21/2017 at am  . ranitidine (ZANTAC) 150 MG tablet Take 150 mg by mouth 2 (two) times daily.    12/21/2017 at am  . tamsulosin (FLOMAX) 0.4 MG CAPS capsule Take 0.4 mg by mouth at bedtime.    12/20/2017 at pm  . traZODone (DESYREL) 100 MG tablet Take 200 mg by mouth at bedtime.  4 12/20/2017 at pm  . warfarin (COUMADIN) 5 MG tablet Take 5 mg by mouth See admin instructions. Take one tablet (5 mg) by mouth Monday, Tuesday, Wednesday, Friday, Saturday nights - 9pm   12/20/2017 at 2100  . warfarin (COUMADIN) 7.5 MG tablet Take 7.5 mg by mouth See admin instructions. Take one tablet (7.5 mg) by mouth on Sunday and Thursday nights - 9pm (take 5 mg tablet on all other days of the week)   12/18/2017 at 2100  . blood glucose meter kit and supplies Dispense based on patient and insurance preference. Use up to four times daily as directed. (FOR ICD-10 E10.9,  E11.9). 1 each 0 Taking  . mirtazapine (REMERON) 15 MG tablet Take 1 tablet at night for 1 week, then increase to 2 tablets at night (Patient not taking: Reported on 12/21/2017) 60 tablet 11 Not Taking at Unknown time    Assessment: 67 y.o. M on Coumadin PTA for mechanical aortic valve. INR was therapeutic on admission. Home dose: 35m daily except for 7.56mon Sun and Thur  INR now down to 1.8 (subtherapeutic). Likely due to dose missed 9/8. MD started Lovenox bridge today - 12039mQ q12h.  CBC stable, no bleeding noted  Goal of Therapy:  INR 2-3 Monitor platelets by anticoagulation protocol: Yes   Plan:  Coumadin 7.5mg8mnight Will d/c daily coumadin regimen for now Daily INR Lovenox 120mg67mq12h (per MD)  CaronSherlon HandingrmD, BCPS Clinical pharmacist  **Pharmacist phone directory can now be found on [email or web address] (PW [email or web address]).  Listed under [facility] Ph[place]10/2019,2:43 PM

## 2017-12-24 DIAGNOSIS — B1920 Unspecified viral hepatitis C without hepatic coma: Secondary | ICD-10-CM

## 2017-12-24 DIAGNOSIS — D692 Other nonthrombocytopenic purpura: Secondary | ICD-10-CM

## 2017-12-24 LAB — PROTIME-INR
INR: 1.57
Prothrombin Time: 18.6 seconds — ABNORMAL HIGH (ref 11.4–15.2)

## 2017-12-24 LAB — CBC
HCT: 30 % — ABNORMAL LOW (ref 39.0–52.0)
Hemoglobin: 9.7 g/dL — ABNORMAL LOW (ref 13.0–17.0)
MCH: 26.6 pg (ref 26.0–34.0)
MCHC: 32.3 g/dL (ref 30.0–36.0)
MCV: 82.2 fL (ref 78.0–100.0)
Platelets: 166 10*3/uL (ref 150–400)
RBC: 3.65 MIL/uL — ABNORMAL LOW (ref 4.22–5.81)
RDW: 14.8 % (ref 11.5–15.5)
WBC: 8.1 10*3/uL (ref 4.0–10.5)

## 2017-12-24 LAB — BASIC METABOLIC PANEL
Anion gap: 9 (ref 5–15)
BUN: 34 mg/dL — ABNORMAL HIGH (ref 8–23)
CO2: 26 mmol/L (ref 22–32)
Calcium: 8.3 mg/dL — ABNORMAL LOW (ref 8.9–10.3)
Chloride: 98 mmol/L (ref 98–111)
Creatinine, Ser: 1.53 mg/dL — ABNORMAL HIGH (ref 0.61–1.24)
GFR calc Af Amer: 53 mL/min — ABNORMAL LOW (ref >60)
GFR calc non Af Amer: 45 mL/min — ABNORMAL LOW (ref >60)
Glucose, Bld: 153 mg/dL — ABNORMAL HIGH (ref 70–99)
Potassium: 4.5 mmol/L (ref 3.5–5.1)
Sodium: 133 mmol/L — ABNORMAL LOW (ref 135–145)

## 2017-12-24 LAB — GLUCOSE, CAPILLARY
Glucose-Capillary: 164 mg/dL — ABNORMAL HIGH (ref 70–99)
Glucose-Capillary: 195 mg/dL — ABNORMAL HIGH (ref 70–99)
Glucose-Capillary: 158 mg/dL — ABNORMAL HIGH (ref 70–99)
Glucose-Capillary: 132 mg/dL — ABNORMAL HIGH (ref 70–99)

## 2017-12-24 MED ORDER — ONDANSETRON HCL 4 MG/2ML IJ SOLN: 4.0000 mg | Freq: Three times a day (TID) | INTRAMUSCULAR | Status: DC | PRN

## 2017-12-24 MED ORDER — INSULIN ASPART 100 UNIT/ML ~~LOC~~ SOLN
5.0000 [IU] | Freq: Three times a day (TID) | SUBCUTANEOUS | Status: DC
  Administered 2017-12-25 – 2017-12-27 (×8): 5 [IU] via SUBCUTANEOUS

## 2017-12-24 MED ORDER — WARFARIN SODIUM 7.5 MG PO TABS
7.5000 mg | ORAL_TABLET | Freq: Once | ORAL | Status: AC
  Administered 2017-12-24: 7.5 mg via ORAL
  Filled 2017-12-24: qty 1

## 2017-12-24 NOTE — Evaluation (Signed)
Occupational Therapy Evaluation Patient Details Name: Adam Savage MRN: [id number] DOB: 01-28-1951 Today's Date: 12/24/2017    History of Present Illness Pt is a 67 y.o. male presenting with right LE swelling and bruising. PMH is significant for COPD, type 2 diabetes mellitus, CAD, aortic valve replacement.   Clinical Impression   PT admitted with R LE rash/ swelling. Pt currently with functional limitiations due to the deficits listed below (see OT problem list). Question if patient is near baseline. Pt requesting to return home with cognitive deficits noted.  Pt will benefit from skilled OT to increase their independence and safety with adls and balance to allow discharge [place].     Follow Up Recommendations  Home health OT    Equipment Recommendations  None recommended by OT    Recommendations for Other Services       Precautions / Restrictions Precautions Precautions: None Required Braces or Orthoses: Other Brace/Splint(CAM walker on RLE) Other Brace/Splint: declined to wear cam boot Restrictions Weight Bearing Restrictions: No      Mobility Bed Mobility Overal bed mobility: Modified Independent                Transfers Overall transfer level: Needs assistance Equipment used: Rolling walker (2 wheeled) Transfers: Sit to/from Stand Sit to Stand: Modified independent (Device/Increase time)         General transfer comment: Pt Mod I for sit<>stand without CAM walker donned. Demonstrates safe hand placement without the need for cues.    Balance Overall balance assessment: Mild deficits observed, not formally tested                                         ADL either performed or assessed with clinical judgement   ADL Overall ADL's : Needs assistance/impaired Eating/Feeding: Set up   Grooming: Set up               Lower Body Dressing: Minimal assistance   Toilet Transfer: Min guard           Functional mobility during ADLs:  Min guard;Rolling walker General ADL Comments: pt able to cross bil LE to reach feet on bed surface. pt with some confusion. questions if cognition is near baseline     Vision         Perception     Praxis      Pertinent Vitals/Pain Pain Assessment: Faces Faces Pain Scale: Hurts little more Pain Location: bil Calf muscle Pain Descriptors / Indicators: Discomfort;Tightness;Throbbing     Hand Dominance Right   Extremity/Trunk Assessment Upper Extremity Assessment Upper Extremity Assessment: Overall WFL for tasks assessed   Lower Extremity Assessment Lower Extremity Assessment: Defer to PT evaluation   Cervical / Trunk Assessment Cervical / Trunk Assessment: Normal   Communication Communication Communication: No difficulties   Cognition Arousal/Alertness: Awake/alert Behavior During Therapy: Impulsive;Anxious Overall Cognitive Status: Impaired/Different from baseline Area of Impairment: Safety/judgement;Following commands;Memory;Awareness                     Memory: Decreased recall of precautions;Decreased short-term memory Following Commands: Follows multi-step commands inconsistently;Follows one step commands consistently;Follows one step commands with increased time Safety/Judgement: Decreased awareness of safety;Decreased awareness of deficits Awareness: Emergent   General Comments: oriented to location and reason for admission being rash.    General Comments       Exercises     Shoulder Instructions  Home Living Family/patient expects to be discharged to:: Private residence Living Arrangements: Children Available Help at Discharge: Family;Available 24 hours/day Type of Home: House Home Access: Level entry     Home Layout: One level     Bathroom Shower/Tub: Teacher, early years/pre: Standard     Home Equipment: Environmental consultant - 2 wheels;Cane - single point   Additional Comments: Pt lives with 59 y.o. daughter and 21 y.o.  granddaughter. has a dog named BOO BOO       Prior Functioning/Environment Level of Independence: Independent;Independent with assistive device(s)        Comments: Utilized SPC on occassions.         OT Problem List: Decreased activity tolerance;Impaired balance (sitting and/or standing);Decreased cognition;Decreased safety awareness;Decreased knowledge of use of DME or AE;Decreased knowledge of precautions;Obesity      OT Treatment/Interventions: Self-care/ADL training;Therapeutic exercise;DME and/or AE instruction;Therapeutic activities;Cognitive remediation/compensation;Patient/family education;Balance training    OT Goals(Current goals can be found in the care plan section) Acute Rehab OT Goals Patient Stated Goal: to walk without pain OT Goal Formulation: With patient Time For Goal Achievement: 01/07/18 Potential to Achieve Goals: Good  OT Frequency: Min 2X/week   Barriers to D/C: Decreased caregiver support          Co-evaluation              AM-PAC PT "6 Clicks" Daily Activity     Outcome Measure Help from another person eating meals?: None Help from another person taking care of personal grooming?: A Little Help from another person toileting, which includes using toliet, bedpan, or urinal?: A Little Help from another person bathing (including washing, rinsing, drying)?: A Little Help from another person to put on and taking off regular upper body clothing?: A Little Help from another person to put on and taking off regular lower body clothing?: A Little 6 Click Score: 19   End of Session Equipment Utilized During Treatment: Rolling walker Nurse Communication: Mobility status;Precautions  Activity Tolerance: Patient tolerated treatment well Patient left: in bed;with call bell/phone within reach;with bed alarm set  OT Visit Diagnosis: Unsteadiness on feet (R26.81)                Time: 1655-3748 OT Time Calculation (min): 21 min Charges:  OT General  Charges $OT Visit: 1 Visit OT Evaluation $OT Eval Moderate Complexity: 1 Mod   Jeri Modena, OTR/L  Acute Rehabilitation Services Pager: [phone] Office: [phone] .   Parke Poisson B 12/24/2017, 4:39 PM

## 2017-12-24 NOTE — Progress Notes (Signed)
  Date: 12/24/2017  Patient name: Adam Savage  Medical record number: [id number]  Date of birth: 02-Dec-1950   I have seen and evaluated this patient and I have discussed the plan of care with the house staff. Please see their note for complete details. I concur with their findings with the following additions/corrections: Mr. Rijo was seen on morning rounds with the team.  1. R leg pain and swelling - clinical dx is ruptured baker's cyst.  The ecchymosis and swelling has improved a bit since admission.  Continue to follow clinically   2. Acute on chronic diastolic heart failure - no diuresis yesterday.  Received 20 mg IV in the morning and 60 mg in the evening.  His weight is reported as being down 6 pounds since admission and is now 280 but he has not had any net negative diuresis.  Weight goal 271.  Creatinine is overall stable but his GFR is in the 40s.  3. 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  Pathology from his punch biopsy is also pending.  His lesions, palpable purpura, has spread especially on the right lower extremity.  4. Microscopic hematuria - await vasculitis W/U. Might need outpt urology  Bartholomew Crews, MD 12/24/2017, 3:35 PM

## 2017-12-24 NOTE — Progress Notes (Signed)
Subjective:  Adam Savage is a 67 y.o. with PMH of HFpEF, COPD, OSA, T2DM, Aortic Valve Replacement, Temporal lobe epilepsy s/p lobectomy admit for right lower extremity swelling on hospital day 2  [name] was examined and evaluated at bedside this a.m.  Was observed sleeping soundly in bed this morning.  Upon awakening he states he just feels very tired as he had a rough night of sleep.  He states he used his CPAP machine and he slept better than usual but stopped using it sometime at night due to discomfort. He stated that he noticed his lower extremity rash has increased in size overnight.  States no significant pain, erythema, warmth at the punch biopsy site.  Denies any F/N/V/D/C.  Objective:  Vital signs in last 24 hours: Vitals:   12/24/17 0151 12/24/17 0339 12/24/17 0500 12/24/17 0911  BP:  (!) 113/50    Pulse:  83    Resp:  18    Temp:  98 F (36.7 C)    TempSrc:  Oral    SpO2: 98% 93%  95%  Weight:   126.8 kg   Height:       Physical Exam  Constitutional: He is oriented to person, place, and time and well-developed, well-nourished, and in no distress. No distress.  HENT:  Head: Normocephalic and atraumatic.  Mouth/Throat: Oropharynx is clear and moist.  Eyes: Pupils are equal, round, and reactive to light. Conjunctivae and EOM are normal. No scleral icterus.  Neck: Normal range of motion. Neck supple.  Cardiovascular: Normal rate, regular rhythm and intact distal pulses.  Loud S1  Pulmonary/Chest: Effort normal. He has wheezes (expiratory wheezes). He has rales (mild bibasilar rales).  Abdominal: Soft. Bowel sounds are normal. He exhibits no distension. There is no tenderness. There is no guarding.  Musculoskeletal: Normal range of motion. He exhibits edema (1+ pitting edma on left lower extremity. nonpitting edema on right lower extremity) and tenderness (tenderness to palpation of right lower extremity). He exhibits no deformity.  Neurological: He is alert and  oriented to person, place, and time.  Skin: Skin is warm and dry. Rash (Significantly increased spread of palpable purpura especially on right lower extremity) noted.        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 DVT ruled out. Baker's Cyst rupture most likely. PT recommend ALF on discharge. His daughter states that he is unable to care for himself at home 2/2 personality changes since temporal lobectomy. Requesting OT eval. MMSE today show 26/30 (mild cognitive impairment) Will need to reassess best way to perform safe discharge if he is unable to take his medication at home as his daughter claims.  Acute Right Calf Pain 2/2 Baker's Cyst rupture Right lower extremity ultrasound show no DVT and no cystic structure in popliteal fossa but clinically most likely ruptured baker's cyst. Ecchymosis improving. WBC 8.1<-11.3 - F/U right lower extremity ultrasound - Trend CBC - Fall precautions  Diastolic Heart Failure with EF 55% 7/19 Reports of daily weight with dry weight 275lbs. Lost 3 pounds (279.5<-282). Urine output 1.5L Will attempt to get closer to dry weight before discharge. 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 Hep c viral load 243,000. ANCA negative. Cryoglobulin pending. Punch biopsy performed yesterday -F/u Hep C Quant, ANCA, Cryoglobulin - F/u Punch biopsy  T2DM Required 13 extra aspart yesterday. Increased to Lantus 25 units qhs yesterday. Fasting this AM 164 - Increase to Lantus 25 units qhs - Start Lantus 5 units TID WC - Sliding scale aspart  OSA Patient endorsing better sleep with CPAP - c/w CPAP at  night  COPD Currently satting 97 on RA.  - C/w [id number]  Hx of Temporal Lobe Epilepsy s/p lobectomy - OT evaluation today - C/w Home meds: Keppra 1000mg  BID, Trazodone 200mg  qhs, Remeron 30mg  qhs  Hx of Aortic valve replacement INR subtherapeutic this AM at 1.83. Patient has been refusing Lovenox as he states he is on Coumadin. - Reassured pt that his Coumadin is subtherapeutic and taking Lovenox will help bridging - Lovenox 120mg  BID for bridging - Warfarin dosing per pharmacy   Hx of NSTEMi - C/w home meds: Asa 81mg  daily, Atorvastatin 10mg  daily  DVT ppx: Coumadin + Lovenox Bowel: Pepcid 20mg  daily Diet: Diabetic/HF diet w/ fluid restriction Code:FULL code  Dispo: Anticipated discharge in approximately 2 day(s).   Adam Anis, MD 12/24/2017, 1:49 PM Pager: [phone]

## 2017-12-24 NOTE — Progress Notes (Signed)
Patient states he places self on and off of CPAP when ready. RT informed patient if he has any trouble have RN contact RT. 

## 2017-12-24 NOTE — Progress Notes (Addendum)
ANTICOAGULATION CONSULT NOTE - Initial Consult  Pharmacy Consult for Coumadin Indication: mechanical valve (aortic valve)  Allergies  Allergen Reactions  . Morphine And Related Other (See Comments)    Combative     Patient Measurements: Height: 6' (182.9 cm) Weight: 279 lb 8 oz (126.8 kg) IBW/kg (Calculated) : 77.6  Vital Signs: Temp: 98 F (36.7 C) (09/11 0339) Temp Source: Oral (09/11 0339) BP: 113/50 (09/11 0339) Pulse Rate: 83 (09/11 0339)  Labs: Recent Labs    12/22/17 0212 12/22/17 0213 12/22/17 1504 12/23/17 0529 12/24/17 0539  HGB  --  9.8* 8.3* 10.0* 9.7*  HCT  --  30.8* 26.5* 31.2* 30.0*  PLT  --  145* 141* 183 166  LABPROT  --  23.9*  --  21.0* 18.6*  INR  --  2.15  --  1.83 1.57  CREATININE  --  1.49*  --  1.50* 1.53*  CKTOTAL 75  --   --   --   --     Estimated Creatinine Clearance: 64.5 mL/min (A) (by C-G formula based on SCr of 1.53 mg/dL (H)).   Medical History: Past Medical History:  Diagnosis Date  . COPD (chronic obstructive pulmonary disease) (HCC)   . Diabetes mellitus without complication (HCC)   . Mechanical heart valve present   . Pre-diabetes   . Seizures (HCC)     Medications:  Medications Prior to Admission  Medication Sig Dispense Refill Last Dose  . acetaminophen (TYLENOL) 650 MG CR tablet Take 1,300 mg by mouth every 8 (eight) hours as needed for pain.   12/21/2017 at 1630  . aspirin EC 81 MG tablet Take 81 mg by mouth daily.   12/21/2017 at am  . atorvastatin (LIPITOR) 10 MG tablet Take 1 tablet (10 mg total) by mouth daily. 30 tablet 0 12/21/2017 at am  . carvedilol (COREG) 6.25 MG tablet Take 1 tablet (6.25 mg total) by mouth 2 (two) times daily with a meal. 180 tablet 3 12/21/2017 at 900  . levETIRAcetam (KEPPRA) 1000 MG tablet Take 1 tablet (1,000 mg total) by mouth 2 (two) times daily. 180 tablet 3 12/21/2017 at am  . mirtazapine (REMERON) 30 MG tablet Take 30 mg by mouth at bedtime.  10 12/20/2017 at pm  . ramipril (ALTACE) 5 MG  capsule Take 1 capsule (5 mg total) by mouth daily. 90 capsule 3 12/21/2017 at am  . ranitidine (ZANTAC) 150 MG tablet Take 150 mg by mouth 2 (two) times daily.    12/21/2017 at am  . tamsulosin (FLOMAX) 0.4 MG CAPS capsule Take 0.4 mg by mouth at bedtime.    12/20/2017 at pm  . traZODone (DESYREL) 100 MG tablet Take 200 mg by mouth at bedtime.  4 12/20/2017 at pm  . warfarin (COUMADIN) 5 MG tablet Take 5 mg by mouth See admin instructions. Take one tablet (5 mg) by mouth Monday, Tuesday, Wednesday, Friday, Saturday nights - 9pm   12/20/2017 at 2100  . warfarin (COUMADIN) 7.5 MG tablet Take 7.5 mg by mouth See admin instructions. Take one tablet (7.5 mg) by mouth on Sunday and Thursday nights - 9pm (take 5 mg tablet on all other days of the week)   12/18/2017 at 2100  . blood glucose meter kit and supplies Dispense based on patient and insurance preference. Use up to four times daily as directed. (FOR ICD-10 E10.9, E11.9). 1 each 0 Taking  . mirtazapine (REMERON) 15 MG tablet Take 1 tablet at night for 1 week, then increase to   2 tablets at night (Patient not taking: Reported on 12/21/2017) 60 tablet 11 Not Taking at Unknown time    Assessment: 67 y.o. M admitted 9/8 with chief complaint of - on warfarin PTA for mechanical aortic valve (2004). His home dose is 5mg daily except for 7.5mg on Sunday and Thursday. INR was therapeutic (2.43) on admission, but unfortunately patient missed 7.5 mg Sunday dose.  INR remains subtherapeutic trending down from 1.8 on 9/10 to 1.57 today. INR trend is most likely reflecting his missed dose on 9/8. Patient started on lovenox 120mg q12h bridge 9/10 (note pt refusing doses of lovenox)  Patient's CBC is stable this AM, no bleeding noted at this time.  Goal of Therapy:  INR 2-3 Monitor platelets by anticoagulation protocol: Yes   Plan: Warfarin 7.5mg x 1 tonight Daily INR  Continue lovenox 120mg SQ q12h (per MD)   [place], PharmD PGY1 Pharmacy Resident Phone:  [phone] 12/24/2017,7:06 AM  

## 2017-12-24 NOTE — Progress Notes (Signed)
Md made aware of BP 89/62, HR 85 patient is asymptomatic. Rechecked BP in an hour 124/89, HR 98.

## 2017-12-25 ENCOUNTER — Encounter (INDEPENDENT_AMBULATORY_CARE_PROVIDER_SITE_OTHER): Payer: Self-pay | Admitting: Neurology

## 2017-12-25 ENCOUNTER — Other Ambulatory Visit: Payer: Self-pay | Admitting: Internal Medicine

## 2017-12-25 ENCOUNTER — Other Ambulatory Visit: Payer: Self-pay

## 2017-12-25 ENCOUNTER — Telehealth: Payer: Self-pay | Admitting: General Practice

## 2017-12-25 DIAGNOSIS — I5030 Unspecified diastolic (congestive) heart failure: Secondary | ICD-10-CM

## 2017-12-25 LAB — BASIC METABOLIC PANEL
Anion gap: 8 (ref 5–15)
BUN: 32 mg/dL — ABNORMAL HIGH (ref 8–23)
CO2: 28 mmol/L (ref 22–32)
Calcium: 8.2 mg/dL — ABNORMAL LOW (ref 8.9–10.3)
Chloride: 99 mmol/L (ref 98–111)
Creatinine, Ser: 1.6 mg/dL — ABNORMAL HIGH (ref 0.61–1.24)
GFR calc Af Amer: 50 mL/min — ABNORMAL LOW (ref >60)
GFR calc non Af Amer: 43 mL/min — ABNORMAL LOW (ref >60)
Glucose, Bld: 122 mg/dL — ABNORMAL HIGH (ref 70–99)
Potassium: 3.6 mmol/L (ref 3.5–5.1)
Sodium: 135 mmol/L (ref 135–145)

## 2017-12-25 LAB — GLUCOSE, CAPILLARY
Glucose-Capillary: 179 mg/dL — ABNORMAL HIGH (ref 70–99)
Glucose-Capillary: 151 mg/dL — ABNORMAL HIGH (ref 70–99)
Glucose-Capillary: 176 mg/dL — ABNORMAL HIGH (ref 70–99)
Glucose-Capillary: 140 mg/dL — ABNORMAL HIGH (ref 70–99)

## 2017-12-25 LAB — PROTIME-INR
INR: 2.32
Prothrombin Time: 25.3 seconds — ABNORMAL HIGH (ref 11.4–15.2)

## 2017-12-25 MED ORDER — FUROSEMIDE 20 MG PO TABS: 60.0000 mg | ORAL_TABLET | Freq: Every day | ORAL | 0 refills | Status: DC

## 2017-12-25 MED ORDER — CANAGLIFLOZIN 100 MG PO TABS: 100.0000 mg | ORAL_TABLET | Freq: Every day | ORAL | 0 refills | Status: DC

## 2017-12-25 MED ORDER — WARFARIN SODIUM 5 MG PO TABS
5.0000 mg | ORAL_TABLET | Freq: Once | ORAL | Status: AC
  Administered 2017-12-25: 5 mg via ORAL
  Filled 2017-12-25: qty 1

## 2017-12-25 MED ORDER — POTASSIUM CHLORIDE CRYS ER 20 MEQ PO TBCR
40.0000 meq | EXTENDED_RELEASE_TABLET | Freq: Once | ORAL | Status: AC
  Administered 2017-12-25: 40 meq via ORAL
  Filled 2017-12-25: qty 2

## 2017-12-25 MED ORDER — ALBUTEROL SULFATE HFA 108 (90 BASE) MCG/ACT IN AERS: 2.0000 | INHALATION_SPRAY | Freq: Four times a day (QID) | RESPIRATORY_TRACT | 2 refills | Status: DC | PRN

## 2017-12-25 NOTE — Progress Notes (Addendum)
Per off going RN Remo Lipps received call from pt daughter stating she will not be able to pick up pt tonight. She made an appeal w/ medicare/medicaid and she will be here tomorrow. On call Md Dorell made aware via text page.

## 2017-12-25 NOTE — Progress Notes (Signed)
RT at bedside, pt states he does wear CPAP at night.  Pt has his home CPAP and does not need assistance putting on.

## 2017-12-25 NOTE — Discharge Summary (Signed)
Name: Adam Savage MRN: [id number] DOB: Nov 05, 1950 67 y.o. PCP: Patient, [name]  Date of Admission: 12/21/2017  7:26 PM Date of Discharge: 12/27/2017 2:00 PM Attending Physician: Bartholomew Crews, MD  Discharge Diagnosis: 1.  Ruptured Baker's cyst 2. Acute exacerbation of diastolic heart failure 3. Hepatitis C cryoglobulinemia vasculitis 4. Type 2 diabetes mellitus 5. Obstructive sleep apnea 6. COPD 7. History of aortic valve replacement  Discharge Medications: Allergies as of 12/27/2017      Reactions   Morphine And Related Other (See Comments)   Combative      Medication List    TAKE these medications   acetaminophen 650 MG CR tablet Commonly known as:  TYLENOL Take 1,300 mg by mouth every 8 (eight) hours as needed for pain.   albuterol 108 (90 Base) MCG/ACT inhaler Commonly known as:  PROVENTIL HFA;VENTOLIN HFA Inhale 2 puffs into the lungs every 6 (six) hours as needed for wheezing or shortness of breath.   aspirin EC 81 MG tablet Take 81 mg by mouth daily.   atorvastatin 10 MG tablet Commonly known as:  LIPITOR Take 1 tablet (10 mg total) by mouth daily.   blood glucose meter kit and supplies Dispense based on patient and insurance preference. Use up to four times daily as directed. (FOR ICD-10 E10.9, E11.9).   canagliflozin 100 MG Tabs tablet Commonly known as:  INVOKANA Take 1 tablet (100 mg total) by mouth daily before breakfast.   carvedilol 6.25 MG tablet Commonly known as:  COREG Take 1 tablet (6.25 mg total) by mouth 2 (two) times daily with a meal.   furosemide 20 MG tablet Commonly known as:  LASIX Take 3 tablets (60 mg total) by mouth daily.   levETIRAcetam 1000 MG tablet Commonly known as:  KEPPRA Take 1 tablet (1,000 mg total) by mouth 2 (two) times daily.   mirtazapine 30 MG tablet Commonly known as:  REMERON Take 30 mg by mouth at bedtime. What changed:  Another medication with the same name was removed. Continue taking this  medication, and follow the directions you see here.   ramipril 5 MG capsule Commonly known as:  ALTACE Take 1 capsule (5 mg total) by mouth daily.   ranitidine 150 MG tablet Commonly known as:  ZANTAC Take 150 mg by mouth 2 (two) times daily.   tamsulosin 0.4 MG Caps capsule Commonly known as:  FLOMAX Take 0.4 mg by mouth at bedtime.   traZODone 100 MG tablet Commonly known as:  DESYREL Take 200 mg by mouth at bedtime.   warfarin 7.5 MG tablet Commonly known as:  COUMADIN Take as directed. If you are unsure how to take this medication, talk to your nurse or doctor. Original instructions:  Take 7.5 mg by mouth See admin instructions. Take one tablet (7.5 mg) by mouth on Sunday and Thursday nights - 9pm (take 5 mg tablet on all other days of the week)   warfarin 5 MG tablet Commonly known as:  COUMADIN Take as directed. If you are unsure how to take this medication, talk to your nurse or doctor. Original instructions:  Take 5 mg by mouth See admin instructions. Take one tablet (5 mg) by mouth Monday, Tuesday, Wednesday, Friday, Saturday nights - 9pm            Discharge Care Instructions  (From admission, onward)         Start     Ordered   12/25/17 0000  Discharge wound care:  Comments:  Keep the biopsy site covered with bandaid   12/25/17 1401          Disposition and follow-up:   Adam Savage was discharged from [facility] in Stable condition.  At the hospital follow up visit please address:  1.  Baker's Cyst: Please ensure swelling and ecchymosis has resolved or is resolving. 2. HFpEF: Was discharged on Lasix 58m daily. Please check for edema, hypokalemia and AKI. 3. T2DM: Discharged on Invokana 1093m Please check his bg log and add on diabetic medications as appropriate 4. Hx of Aortic valve replacement: Had subtherapeutic INR but elevated to therapeutic INR. Currently on home regimen. Please ensure he is continuing to be  therapeutic. 5. Lower extremities Palpable purpura: ANCA negative, Hep C +, Cyroglobulin pending. Punch biopsy show leukocytoclastic vasculitis. Most likely caused by Hep C. Please refer to ID for hep C curative treatment.  5.  Labs / imaging needed at time of follow-up: CBC, BMP, INR  6.  Pending labs/ test needing follow-up: Cryoglobulins  Follow-up Appointments:  Contact information for follow-up providers    [place].   Contact information: [id number] on 01/01/2018.   Why:  10:15 AM Contact information: [id number]         Contact information for after-discharge care    Destination    [facility] Preferred SNF .   Service:  Skilled Nursing Contact information: [id number] [facility] by problem list: 1. Ruptured Baker's Cyst: Presented with lower extremity pain. Ultrasound ruled out DVT. Distribution of ecchymosis and swelling clinically match ruptured baker's cyst. Slowly improving. Discharged after PT assessment. 2. Acute exacerbation of diastolic heart failure: Presented with lower extremity swelling with significant pitting edema and lung rales and significant recent weight gain. Was not on any home diuretics. Started on IV Lasix 20 BID. Showed minimal improvement. Increased to IV Lasix 6043mID. Reached dry weight with minimal increase in creatinine. Discharged on Lasix 74m29m daily. 3. Palpable Purpura: Chart review showed hx of hepatitis C with unclear treatment history. No recent curative treatments. Hep C quant showed 243,000. ANCA negative. Cryoglobulins was sent. Punch biopsy performed which showed leukocytoclastic vasculitis which has a broad differential but most likely hep C related.  Discharged with recommendation for follow up. 4. COPD: Showed significant wheezing on exam. Much improved with Duonebs treatment. Discharged with albuterol inhalers. 5. Obstructive Sleep Apnea: Endorse fatigue and daytime drowsiness. Daughter brought CPAP and patient used intermittently. Fatigue much improved after use at night time. 6. Hx of Mechanical aortic valve replacement: Missed 1 dose of Coumadin. Had subtherapeutic INR and was bridged with Lovenox. Patient refused lovenox doses until explained the need for bridge. Patient stopped refusing. Lovenox stopped once therapeutic INR achieved. Discharge on home Warfarin schedule.   Discharge Vitals:   BP (!) 142/69 (BP Location: Left Arm)   Pulse 89   Temp 97.7 F (36.5 C) (Oral)   Resp 18   Ht 6' (1.829 m)   Wt 122.3 kg   SpO2 93%   BMI 36.57 kg/m   Pertinent Labs, Studies, and Procedures:  BMP Latest Ref Rng & Units 12/26/2017 12/25/2017 12/24/2017  Glucose 70 - 99 mg/dL 141(H) 122(H) 153(H)  BUN 8 - 23 mg/dL 38(H) 32(H) 34(H)  Creatinine 0.61 - 1.24 mg/dL 1.76(H) 1.60(H) 1.53(H)  Sodium 135 - 145 mmol/L 136 135 133(L)  Potassium 3.5 - 5.1 mmol/L 4.0 3.6 4.5  Chloride 98 - 111 mmol/L 99 99 98  CO2 22 - 32 mmol/L '27 28 26  ' Calcium 8.9 - 10.3 mg/dL 8.2(L) 8.2(L) 8.3(L)   HCV quantitative: 243,000  Discharge Instructions: Mr.Eutsler,  You came to the hospital for right calf pain. We have screened you and found no blood clots. We found that you have significant fluid overload from your heart failure so we gave you water pills to decrease your swelling. We now believe you are safe to go home. Here are the instructions for you at discharge:  - Please take your Coumadin as prescribed with 50m daily except on Sunday and Thursday. We will give you your dose tonight so return to your scheduled dosing starting on Sunday - Please take Invokana (canagliflozin) 1033mevery day for your diabetes with breakfast - Please take Lasix (furosemide)  6036mvery day for your leg swelling - Please use Proventil (albuterol needed) 2 puffs as needed for wheezing - Keep your legs elevated at home - Please follow up with your primary care provider soon after discharge. We recommend you get referral for infectious disease providers when you meet your family doctor.   Thank you for choosing [facility]   Discharge Instructions    Call MD for:  difficulty breathing, headache or visual disturbances   Complete by:  As directed    Call MD for:  persistant dizziness or light-headedness   Complete by:  As directed    Call MD for:  persistant nausea and vomiting   Complete by:  As directed    Call MD for:  redness, tenderness, or signs of infection (pain, swelling, redness, odor or green/yellow discharge around incision site)   Complete by:  As directed    Call MD for:  severe uncontrolled pain   Complete by:  As directed    Diet - low sodium heart healthy   Complete by:  As directed    Diet - low sodium heart healthy   Complete by:  As directed    Discharge wound care:   Complete by:  As directed    Keep the biopsy site covered with bandaid   Increase activity slowly   Complete by:  As directed    Increase activity slowly   Complete by:  As directed       Signed: LeeMosetta AnisD PGY1 12/27/2017, 2:08 PM   Pager: [phone]

## 2017-12-25 NOTE — Plan of Care (Signed)
  Problem: Nutrition: Goal: Adequate nutrition will be maintained Outcome: Progressing   Problem: Elimination: Goal: Will not experience complications related to bowel motility Outcome: Progressing   Problem: Safety: Goal: Ability to remain free from injury will improve Outcome: Progressing   

## 2017-12-25 NOTE — Progress Notes (Addendum)
ANTICOAGULATION CONSULT NOTE - Follow-Up Consult  Pharmacy Consult for Coumadin Indication: mechanical valve (aortic valve)  Allergies  Allergen Reactions  . Morphine And Related Other (See Comments)    Combative     Patient Measurements: Height: 6' (182.9 cm) Weight: 271 lb 14.4 oz (123.3 kg) IBW/kg (Calculated) : 77.6  Vital Signs: Temp: 98.3 F (36.8 C) (09/12 0500) Temp Source: Oral (09/12 0500) BP: 108/85 (09/12 0500) Pulse Rate: 85 (09/12 0938)  Labs: Recent Labs    12/22/17 1504 12/23/17 0529 12/24/17 0539 12/25/17 0419  HGB 8.3* 10.0* 9.7*  --   HCT 26.5* 31.2* 30.0*  --   PLT 141* 183 166  --   LABPROT  --  21.0* 18.6* 25.3*  INR  --  1.83 1.57 2.32  CREATININE  --  1.50* 1.53* 1.60*    Estimated Creatinine Clearance: 60.8 mL/min (A) (by C-G formula based on SCr of 1.6 mg/dL (H)).   Medical History: Past Medical History:  Diagnosis Date  . COPD (chronic obstructive pulmonary disease) ([place])   . Diabetes mellitus without complication ([place])   . Mechanical heart valve present   . Pre-diabetes   . Seizures ([place])     Medications:  Medications Prior to Admission  Medication Sig Dispense Refill Last Dose  . acetaminophen (TYLENOL) 650 MG CR tablet Take 1,300 mg by mouth every 8 (eight) hours as needed for pain.   12/21/2017 at 1630  . aspirin EC 81 MG tablet Take 81 mg by mouth daily.   12/21/2017 at am  . atorvastatin (LIPITOR) 10 MG tablet Take 1 tablet (10 mg total) by mouth daily. 30 tablet 0 12/21/2017 at am  . carvedilol (COREG) 6.25 MG tablet Take 1 tablet (6.25 mg total) by mouth 2 (two) times daily with a meal. 180 tablet 3 12/21/2017 at 900  . levETIRAcetam (KEPPRA) 1000 MG tablet Take 1 tablet (1,000 mg total) by mouth 2 (two) times daily. 180 tablet 3 12/21/2017 at am  . mirtazapine (REMERON) 30 MG tablet Take 30 mg by mouth at bedtime.  10 12/20/2017 at pm  . ramipril (ALTACE) 5 MG capsule Take 1 capsule (5 mg total) by mouth daily. 90 capsule 3 12/21/2017  at am  . ranitidine (ZANTAC) 150 MG tablet Take 150 mg by mouth 2 (two) times daily.    12/21/2017 at am  . tamsulosin (FLOMAX) 0.4 MG CAPS capsule Take 0.4 mg by mouth at bedtime.    12/20/2017 at pm  . traZODone (DESYREL) 100 MG tablet Take 200 mg by mouth at bedtime.  4 12/20/2017 at pm  . warfarin (COUMADIN) 5 MG tablet Take 5 mg by mouth See admin instructions. Take one tablet (5 mg) by mouth Monday, Tuesday, Wednesday, Friday, Saturday nights - 9pm   12/20/2017 at 2100  . warfarin (COUMADIN) 7.5 MG tablet Take 7.5 mg by mouth See admin instructions. Take one tablet (7.5 mg) by mouth on Sunday and Thursday nights - 9pm (take 5 mg tablet on all other days of the week)   12/18/2017 at 2100  . blood glucose meter kit and supplies Dispense based on patient and insurance preference. Use up to four times daily as directed. (FOR ICD-10 E10.9, E11.9). 1 each 0 Taking  . mirtazapine (REMERON) 15 MG tablet Take 1 tablet at night for 1 week, then increase to 2 tablets at night (Patient not taking: Reported on 12/21/2017) 60 tablet 11 Not Taking at Unknown time    Assessment: 67 y.o. M on warfarin PTA for  mechanical aortic valve (2004). His home dose is 102m daily except for 7.558mon Sunday and Thursday. INR was therapeutic (2.43) on admission, but unfortunately patient missed 7.5 mg Sunday dose (9/8).  INR was previously subtherapeutic and pt was started on Lovenox.  Today, INR is therapeutic (2.32) based on INR goals from outpatient anticoagulation clinic.  Lovenox bridge has been discontinued at this time.    Patient's CBC is stable this AM, no bleeding noted on rounds with patient.  Goal of Therapy:  INR 2-3  Monitor platelets by anticoagulation protocol: Yes   Plan: Warfarin 49m48m 1 today before discharge Resume home PTA warfarin regimen at discharge  RebLeron CroakharmD PGY1 Pharmacy Resident Phone: [phone]/2019,11:39 AM  I have reviewed the note written by Dr. FanMarc MorgansI provided  edits to the assessment and plan as needed.  KimManpower Incharm.D., BCPS Clinical Pharmacist **Pharmacist phone directory can now be found on [email or web address] (PW [email or web address]).  Listed under [facility] [place]9/03/2018 1:42 PM

## 2017-12-25 NOTE — Progress Notes (Signed)
Subjective:  Adam Savage is a 67 y.o. with PMH of HFpEF, COPD, OSA, T2DM, Aortic Valve Replacement, Temporal lobe epilepsy s/p lobectomy admit for right lower extremity swelling on hospital day 3   Adam Savage was examined and evaluated at bedside this a.m.  Observed sleeping comfortably in bed.  States he did not use his CPAP machine overnight however he has had good night of rest.  He is continuing to endorse mild right calf pain but states significant improvement in severity of the pain as well as in bilateral lower extremity swelling. Patient also states that his purpura appears to be improving and decreasing in size. We checked the patient's weight on his medical bed and found his weight to be 269 pounds, which was lower than recorded weight this morning (271.8lbs). Discussed with the patient that he appears to have reached his dry weight. Informed the patient he is safe to be discharged.  Patient is looking forward to going home.  Denies any F/N/V/D/C.  Objective:  Vital signs in last 24 hours: Vitals:   12/24/17 2042 12/24/17 2213 12/25/17 0500 12/25/17 0938  BP:  (!) 105/44 108/85   Pulse: 88 86 97 85  Resp: 17 19 18 15   Temp:  97.8 F (36.6 C) 98.3 F (36.8 C)   TempSrc:  Oral Oral   SpO2: 99% 97% 98% 96%  Weight:   123.3 kg   Height:       Physical Exam  Constitutional: He is oriented to person, place, and time and well-developed, well-nourished, and in no distress. No distress.  Eyes: Pupils are equal, round, and reactive to light. Conjunctivae and EOM are normal. No scleral icterus.  Neck: Normal range of motion. Neck supple.  Cardiovascular: Normal rate, regular rhythm, normal heart sounds and intact distal pulses.  Pulmonary/Chest: Effort normal. He has wheezes (mild expiratory wheeze improved from prior). He has no rales.  Abdominal: Soft. Bowel sounds are normal.  Musculoskeletal: Normal range of motion. He exhibits edema (1+ pitting edema up to mid shin on left leg.  Non-pitting edema with ecchymosis on right leg) and tenderness (Right calf mildly tender to palpation).  Neurological: He is alert and oriented to person, place, and time.  Skin: Skin is warm and dry. Rash (palpable purpura lower extremity bilaterally) noted. He is not diaphoretic.  Psychiatric: Mood, memory, affect and judgment normal.   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 Baker's Cyst rupture most likely. Was keeping him admitted for heart failure exacerbation with fluid overload. Today reached dry weight. Will discharge with close follow up  Acute Right Calf Pain 2/2 Baker's Cyst rupture Resolved  Diastolic Heart Failure with EF 55% 7/19 Reports of daily weight with dry weight 275lbs.AM weight 271.8lbs Output delta 765L. 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 Hep c viral load 243,000. ANCA negative. Cryoglobulin pending. Punch biopsy performed yesterday - F/u Punch biopsy  T2DM Required 11 extra aspart yesterday. Increased to Lantus 25 units qhs yesterday. Fasting this AM 132 -Increase toLantus 25units qhs - C/w Lantus 5 units TID WC - Sliding scale  aspart - Will discharge on Invokana 100mg   OSA Patient endorsing better sleep with CPAP - c/w CPAP at night  COPD Currently satting 97 on RA.  - C/w Duonebsq6hrs - Will discharge with rescue inhaler Proventil  Hx of Temporal Lobe Epilepsy s/p lobectomy - C/w Home meds: Keppra 1000mg  BID, Trazodone 200mg  qhs, Remeron 30mg  qhs  Hx of Aortic valve replacement INR 2.32  this AM - Warfarin dosing per pharmacy(return to home schedule starting tomorrow)  Hx of NSTEMi - C/w home meds: Asa 81mg  daily, Atorvastatin 10mg  daily  DVT ppx: Coumadin + Lovenox Bowel: Pepcid 20mg  daily Diet: Diabetic/HF diet w/ fluid restriction Code:FULL code  Dispo: Anticipated discharge in approximately today(s).   Adam Anis, MD 12/25/2017, 1:41 PM Pager: [phone]

## 2017-12-25 NOTE — Progress Notes (Signed)
Physical Therapy Treatment Patient Details Name: Adam Savage MRN: [id number] DOB: 12/11/1950 Today's Date: 12/25/2017    History of Present Illness Pt is a 67 y.o. male presenting with right LE swelling and bruising. PMH is significant for COPD, type 2 diabetes mellitus, CAD, aortic valve replacement.    PT Comments    Pt progressing well towards goal, but continues to require min G with ambulation secondary to pain. At time of session pt reports pain in LLE and was perseverating on Bil LE rash. Pt continues to have difficulty maintaining attention in order to attend to task during session. Educated pt on general LE exercises to improve ROM, decrease edema, and to maintain strength while admitted. Will continue to follow acutely and progress as able per POC.     Follow Up Recommendations  Home health PT;Supervision for mobility/OOB;Other (comment)(ALF)     Equipment Recommendations  None recommended by PT    Recommendations for Other Services       Precautions / Restrictions Precautions Precautions: None Restrictions Weight Bearing Restrictions: No    Mobility  Bed Mobility Overal bed mobility: Modified Independent Bed Mobility: Supine to Sit;Sit to Supine     Supine to sit: Modified independent (Device/Increase time) Sit to supine: Modified independent (Device/Increase time)   General bed mobility comments: Pt reporting decreased pain levels in RLE upon sitting EOB; now reporting pain in LLE describing it as a "pull" in L calf . Pt continues to require increase time to achieve supine>sit  Transfers Overall transfer level: Needs assistance Equipment used: None Transfers: Sit to/from Stand Sit to Stand: Modified independent (Device/Increase time)         General transfer comment: Pt cued on upright posture upon standing  Ambulation/Gait Ambulation/Gait assistance: Min guard Gait Distance (Feet): 250 Feet Assistive device: None Gait Pattern/deviations:  Step-through pattern;Decreased step length - left;Trunk flexed Gait velocity: decreased Gait velocity interpretation: <1.31 ft/sec, indicative of household ambulator General Gait Details: Min cues to decrease step length on left as pt reported increased pull and pain in LLE, specifically the calf muscle. Pt required seated rest x1 and standing rest x1 secondary to pain in LLE.   Stairs             Wheelchair Mobility    Modified Rankin (Stroke Patients Only)       Balance Overall balance assessment: Mild deficits observed, not formally tested                                          Cognition Arousal/Alertness: Awake/alert Behavior During Therapy: Impulsive Overall Cognitive Status: Impaired/Different from baseline Area of Impairment: Safety/judgement;Following commands;Memory;Awareness                     Memory: Decreased recall of precautions;Decreased short-term memory Following Commands: Follows multi-step commands inconsistently;Follows one step commands consistently;Follows one step commands with increased time Safety/Judgement: Decreased awareness of safety;Decreased awareness of deficits Awareness: Emergent   General Comments: Pt oriented to location when given multiple choice but unable to recall location without curing.       Exercises General Exercises - Lower Extremity Ankle Circles/Pumps: AROM;Both;20 reps Long Arc Quad: AROM;10 reps;Right;Seated Hip Flexion/Marching: AROM;5 reps;Seated;Both Other Exercises Other Exercises: Manual gastroc stretch -seated (Bil 1x20sec) ; unable to stretch longer than 20 seconds as pt anxious to get back to room to eat lunch     General  Comments        Pertinent Vitals/Pain Pain Assessment: Faces Faces Pain Scale: Hurts little more Pain Location: L calf muscle Pain Descriptors / Indicators: Aching;Tiring;Grimacing;Throbbing Pain Intervention(s): Limited activity within patient's  tolerance;Monitored during session;Repositioned    Home Living Family/patient expects to be discharged to:: Private residence Living Arrangements: Children Available Help at Discharge: Family;Available 24 hours/day Type of Home: House Home Access: Level entry   Home Layout: One level Home Equipment: Walker - 2 wheels;Cane - single point Additional Comments: Pt lives with 15 y.o. daughter and 79 y.o. granddaughter. has a dog named BOO BOO     Prior Function Level of Independence: Independent;Independent with assistive device(s)      Comments: Utilized SPC on occassions.    PT Goals (current goals can now be found in the care plan section) Acute Rehab PT Goals Patient Stated Goal: to walk without pain PT Goal Formulation: With patient Time For Goal Achievement: 12/29/17 Potential to Achieve Goals: Good    Frequency    Min 2X/week      PT Plan      Co-evaluation              AM-PAC PT "6 Clicks" Daily Activity  Outcome Measure  Difficulty turning over in bed (including adjusting bedclothes, sheets and blankets)?: None Difficulty moving from lying on back to sitting on the side of the bed? : None Difficulty sitting down on and standing up from a chair with arms (e.g., wheelchair, bedside commode, etc,.)?: A Little Help needed moving to and from a bed to chair (including a wheelchair)?: A Little Help needed walking in hospital room?: A Little Help needed climbing 3-5 steps with a railing? : A Little 6 Click Score: 20    End of Session Equipment Utilized During Treatment: Gait belt Activity Tolerance: Patient tolerated treatment well Patient left: in chair;with call bell/phone within reach Nurse Communication: Mobility status PT Visit Diagnosis: Other abnormalities of gait and mobility (R26.89);Unsteadiness on feet (R26.81);Pain Pain - Right/Left: Left Pain - part of body: Leg     Time: 4270-6237 PT Time Calculation (min) (ACUTE ONLY): 22 min  Charges:   $Gait Training: 8-22 mins                     Einar Crow, [place]  Student Physical Therapist Acute Rehab [phone]    Einar Crow 12/25/2017, 2:50 PM

## 2017-12-25 NOTE — Care Management Important Message (Signed)
Important Message  Patient Details  Name: Adam Savage MRN: [id number] Date of Birth: [birth year]   Medicare Important Message Given:  Yes    Kam Kushnir [address] 12/25/2017, 4:06 PM

## 2017-12-25 NOTE — Progress Notes (Signed)
  Date: 12/25/2017  Patient name: Adam Savage  Medical record number: [id number]  Date of birth: 1951-04-15   I have seen and evaluated this patient and I have discussed the plan of care with the house staff. Please see their note for complete details. I concur with their findings with the following additions/corrections: [name] was seen this morning with the team on rounds.  His weight is now down, actually lower than his documented dry weight.  His creatinine has trended up a little bit from 1.46 on admission to 1.6 today.  His CO2 has also increased a bit although it is still in normal range.  Therefore, this confirms that he is likely at his dry weight and can be safely discharged on 60 of Lasix orally daily.  We are still awaiting cryoglobulins and his punch biopsy results.  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  He will need to follow-up with [facility] as an outpatient.  Other daughter was hopeful her father could enter assisted living, but is not an option due to payer source.  He will go home with home health assistance.  Bartholomew Crews, MD 12/25/2017, 4:50 PM

## 2017-12-25 NOTE — Social Work (Signed)
CSW spoke with pt's daughter Caryl Pina via telephone. She is aware that pt is currently doing too well functionally and has no current nursing needs (dressing changes, iv antibiotics etc) that would be able to place pt for SNF stay. Pt daughter states understanding that RN Case Manager may be able to order home health and that pt daughter should contact assisted living locater given by CSW to assist pt family and pt to find suitable disposition for pt.   Pt daughter amenable to speaking with Nira Conn, RN Case Manager regarding home health.   CSW signing off. Please consult if any additional needs arise.  Alexander Mt, [place] Work [phone]

## 2017-12-25 NOTE — Telephone Encounter (Signed)
Hospital f/u est care per Dr Truman Hayward; pt appt 01/01/2018 1015am/NW

## 2017-12-25 NOTE — Care Management Note (Signed)
Case Management Note  Patient Details  Name: Overton Boggus MRN: [id number] Date of Birth: [birth year]  Subjective/Objective:                    Action/Plan: PCP [facility] ( confirmed with Dr Truman Hayward).   Spoke with daughter Caryl Pina [phone], choice offered she would like [facility] . Referral given to [facility] with [facility].   Expected Discharge Date:  12/25/17               Expected Discharge Plan:  [place]  In-House Referral:  NA  Discharge planning Services  NA  Post Acute Care Choice:  NA Choice offered to:  Patient, Adult Children  DME Arranged:  N/A DME Agency:  NA  HH Arranged:  PT, [place] Agency:  [place]  Status of Service:     If discussed at [place] of Stay Meetings, dates discussed:    Additional Comments:  Marilu Favre, RN 12/25/2017, 2:22 PM

## 2017-12-26 ENCOUNTER — Telehealth: Payer: Self-pay | Admitting: Neurology

## 2017-12-26 DIAGNOSIS — D891 Cryoglobulinemia: Secondary | ICD-10-CM

## 2017-12-26 LAB — BASIC METABOLIC PANEL
Anion gap: 10 (ref 5–15)
BUN: 38 mg/dL — ABNORMAL HIGH (ref 8–23)
CO2: 27 mmol/L (ref 22–32)
Calcium: 8.2 mg/dL — ABNORMAL LOW (ref 8.9–10.3)
Chloride: 99 mmol/L (ref 98–111)
Creatinine, Ser: 1.76 mg/dL — ABNORMAL HIGH (ref 0.61–1.24)
GFR calc Af Amer: 44 mL/min — ABNORMAL LOW (ref >60)
GFR calc non Af Amer: 38 mL/min — ABNORMAL LOW (ref >60)
Glucose, Bld: 141 mg/dL — ABNORMAL HIGH (ref 70–99)
Potassium: 4 mmol/L (ref 3.5–5.1)
Sodium: 136 mmol/L (ref 135–145)

## 2017-12-26 LAB — GLUCOSE, CAPILLARY
Glucose-Capillary: 137 mg/dL — ABNORMAL HIGH (ref 70–99)
Glucose-Capillary: 183 mg/dL — ABNORMAL HIGH (ref 70–99)
Glucose-Capillary: 210 mg/dL — ABNORMAL HIGH (ref 70–99)
Glucose-Capillary: 128 mg/dL — ABNORMAL HIGH (ref 70–99)

## 2017-12-26 LAB — PROTIME-INR
INR: 2.8
Prothrombin Time: 29.3 seconds — ABNORMAL HIGH (ref 11.4–15.2)

## 2017-12-26 MED ORDER — WARFARIN SODIUM 5 MG PO TABS
5.0000 mg | ORAL_TABLET | Freq: Once | ORAL | Status: AC
  Administered 2017-12-26: 5 mg via ORAL
  Filled 2017-12-26: qty 1

## 2017-12-26 MED ORDER — FUROSEMIDE 40 MG PO TABS
60.0000 mg | ORAL_TABLET | Freq: Every day | ORAL | Status: DC
  Administered 2017-12-26 – 2017-12-27 (×2): 60 mg via ORAL
  Filled 2017-12-26 (×2): qty 1

## 2017-12-26 NOTE — Care Management Note (Addendum)
Case Management Note  Patient Details  Name: Harden Bramer MRN: [id number] Date of Birth: [birth year]  Subjective/Objective:                    Action/Plan:Added home health social work.  Talked at length with patient's daughter Caryl Pina on phone. Caryl Pina has appealed her father's discharge. Detailed Notice of Discharge read and explained to Laqueta Carina voiced understanding.  Ashley aware QIO have 24 hours to make a decision and then patient has until noon the next day to leave the hospital if QIO agrees with MD that patient is medically ready to be discharged.  Caryl Pina stated that she was told that psychiatry had recommended medication changes that was not safe to do at home and patient  would be placed. She would like to talk to Dr Truman Hayward regarding these concerns. Dr Truman Hayward aware and will talk with Caryl Pina. Expected Discharge Date:  12/25/17               Expected Discharge Plan:  [place]  In-House Referral:  NA  Discharge planning Services  NA  Post Acute Care Choice:  NA Choice offered to:  Patient, Adult Children  DME Arranged:  N/A DME Agency:  NA  HH Arranged:  PT, OT [place] Agency:  [place]  Status of Service:  In process, will continue to follow  If discussed at [place] of Stay Meetings, dates discussed:    Additional Comments:  Marilu Favre, RN 12/26/2017, 9:59 AM

## 2017-12-26 NOTE — Telephone Encounter (Signed)
Patient's daughter called and is needing to see if Dr. Delice Lesch can help her regarding her Father. He is being released from the hospital today and she has some concerns with him being discharged today. Please Call. Thanks

## 2017-12-26 NOTE — Progress Notes (Signed)
Occupational Therapy Treatment Patient Details Name: Sacramento Monds MRN: [id number] DOB: December 19, 1950 Today's Date: 12/26/2017    History of present illness Pt is a 67 y.o. male presenting with right LE swelling and bruising. PMH is significant for COPD, type 2 diabetes mellitus, CAD, aortic valve replacement.   OT comments  Pt. Agreeable to participation in skilled OT.  Able to complete bed mobility, LB dressing, and grooming tasks. Cues throughout session for RW safety.  Pt. Remains impulsive and would also repeat questions he had previously asked.   Follow Up Recommendations  Home health OT    Equipment Recommendations       Recommendations for Other Services      Precautions / Restrictions Precautions Precautions: None       Mobility Bed Mobility Overal bed mobility: Modified Independent Bed Mobility: Supine to Sit     Supine to sit: Modified independent (Device/Increase time)        Transfers Overall transfer level: Needs assistance Equipment used: None Transfers: Sit to/from Stand Sit to Stand: Modified independent (Device/Increase time)         General transfer comment: Pt cued on upright posture upon standing    Balance                                           ADL either performed or assessed with clinical judgement   ADL Overall ADL's : Needs assistance/impaired     Grooming: Wash/dry hands;Supervision/safety;Standing               Lower Body Dressing: Set up;Sitting/lateral leans   Toilet Transfer: Min Psychiatric nurse Details (indicate cue type and reason): simulated while amb. to the b.room, declined need for actual use         Functional mobility during ADLs: Min guard;Rolling walker General ADL Comments: pt with some confusion. questions if cognition is near baseline, states he needs rw but would drag it or forget to use it without cues     Vision       Perception     Praxis      Cognition  Arousal/Alertness: Awake/alert Behavior During Therapy: Impulsive                         Memory: Decreased recall of precautions;Decreased short-term memory Following Commands: Follows multi-step commands inconsistently;Follows one step commands consistently;Follows one step commands with increased time                Exercises     Shoulder Instructions       General Comments      Pertinent Vitals/ Pain       Pain Assessment: No/denies pain  Home Living                                          Prior Functioning/Environment              Frequency  Min 2X/week        Progress Toward Goals  OT Goals(current goals can now be found in the care plan section)  Progress towards OT goals: Progressing toward goals     Plan      Co-evaluation  AM-PAC PT "6 Clicks" Daily Activity     Outcome Measure   Help from another person eating meals?: None Help from another person taking care of personal grooming?: A Little Help from another person toileting, which includes using toliet, bedpan, or urinal?: A Little Help from another person bathing (including washing, rinsing, drying)?: A Little Help from another person to put on and taking off regular upper body clothing?: A Little Help from another person to put on and taking off regular lower body clothing?: A Little 6 Click Score: 19    End of Session Equipment Utilized During Treatment: Rolling walker  OT Visit Diagnosis: Unsteadiness on feet (R26.81)   Activity Tolerance Patient tolerated treatment well   Patient Left in chair;with call bell/phone within reach   Nurse Communication Other (comment)(pt. reports concerns for "terrible odor" he thinks comes when he blows his nose or could be UL molar "that is broken all the way to my jaw")        Time: 1211-1224 OT Time Calculation (min): 13 min  Charges: OT General Charges $OT Visit: 1 Visit OT  Treatments $Self Care/Home Management : 8-22 mins   Janice Coffin, COTA/L 12/26/2017, 1:37 PM

## 2017-12-26 NOTE — Progress Notes (Signed)
ANTICOAGULATION CONSULT NOTE - Follow-Up Consult  Pharmacy Consult for Coumadin Indication: mechanical valve (s/p AVR 2002)  Allergies  Allergen Reactions  . Morphine And Related Other (See Comments)    Combative     Patient Measurements: Height: 6' (182.9 cm) Weight: 270 lb (122.5 kg) IBW/kg (Calculated) : 77.6  Vital Signs: Temp: 97.5 F (36.4 C) (09/13 0450) Temp Source: Oral (09/13 0450) BP: 147/84 (09/13 0450) Pulse Rate: 115 (09/13 0450)  Labs: Recent Labs    12/24/17 0539 12/25/17 0419 12/26/17 0441 12/26/17 0445  HGB 9.7*  --   --   --   HCT 30.0*  --   --   --   PLT 166  --   --   --   LABPROT 18.6* 25.3* 29.3*  --   INR 1.57 2.32 2.80  --   CREATININE 1.53* 1.60*  --  1.76*    Estimated Creatinine Clearance: 55.1 mL/min (A) (by C-G formula based on SCr of 1.76 mg/dL (H)).   Medical History: Past Medical History:  Diagnosis Date  . COPD (chronic obstructive pulmonary disease) ([place])   . Diabetes mellitus without complication ([place])   . Mechanical heart valve present   . Pre-diabetes   . Seizures ([place])     Medications:  Medications Prior to Admission  Medication Sig Dispense Refill Last Dose  . acetaminophen (TYLENOL) 650 MG CR tablet Take 1,300 mg by mouth every 8 (eight) hours as needed for pain.   12/21/2017 at 1630  . aspirin EC 81 MG tablet Take 81 mg by mouth daily.   12/21/2017 at am  . atorvastatin (LIPITOR) 10 MG tablet Take 1 tablet (10 mg total) by mouth daily. 30 tablet 0 12/21/2017 at am  . carvedilol (COREG) 6.25 MG tablet Take 1 tablet (6.25 mg total) by mouth 2 (two) times daily with a meal. 180 tablet 3 12/21/2017 at 900  . levETIRAcetam (KEPPRA) 1000 MG tablet Take 1 tablet (1,000 mg total) by mouth 2 (two) times daily. 180 tablet 3 12/21/2017 at am  . mirtazapine (REMERON) 30 MG tablet Take 30 mg by mouth at bedtime.  10 12/20/2017 at pm  . ramipril (ALTACE) 5 MG capsule Take 1 capsule (5 mg total) by mouth daily. 90 capsule 3 12/21/2017 at am   . ranitidine (ZANTAC) 150 MG tablet Take 150 mg by mouth 2 (two) times daily.    12/21/2017 at am  . tamsulosin (FLOMAX) 0.4 MG CAPS capsule Take 0.4 mg by mouth at bedtime.    12/20/2017 at pm  . traZODone (DESYREL) 100 MG tablet Take 200 mg by mouth at bedtime.  4 12/20/2017 at pm  . warfarin (COUMADIN) 5 MG tablet Take 5 mg by mouth See admin instructions. Take one tablet (5 mg) by mouth Monday, Tuesday, Wednesday, Friday, Saturday nights - 9pm   12/20/2017 at 2100  . warfarin (COUMADIN) 7.5 MG tablet Take 7.5 mg by mouth See admin instructions. Take one tablet (7.5 mg) by mouth on Sunday and Thursday nights - 9pm (take 5 mg tablet on all other days of the week)   12/18/2017 at 2100  . blood glucose meter kit and supplies Dispense based on patient and insurance preference. Use up to four times daily as directed. (FOR ICD-10 E10.9, E11.9). 1 each 0 Taking  . mirtazapine (REMERON) 15 MG tablet Take 1 tablet at night for 1 week, then increase to 2 tablets at night (Patient not taking: Reported on 12/21/2017) 60 tablet 11 Not Taking at Unknown time  Assessment: 67 y.o. M on warfarin PTA for mechanical aortic valve (2004). His home dose is 71m daily except for 7.559mon Sunday and Thursday. INR was therapeutic (2.43) on admission, but unfortunately patient missed 7.5 mg Sunday dose (9/8).  During this admission, the patient's INR was subtherapeutic on 9/10 and 9/11, so the patient was started on Lovenox (which has since been discontinued). Today, the INR continues to be therapeutic (2.80). After looking at historical records, it's unclear as to why the patient's INR goal is 2-3 for his mechanical aortic valve. [facility] stated his INR goal to be 3-3.5. Based on this information, I will continue his PTA warfarin dose today to ensure adequate anticoagulation for his mechanical valve.     Patient's CBC is stable this AM, no bleeding noted on rounds with patient.  Goal of  Therapy:  INR 2-3  Monitor platelets by anticoagulation protocol: Yes   Plan: Warfarin 45m68m 1 today Daily INR Resume home PTA warfarin regimen at discharge  RebLeron CroakharmD PGY1 Pharmacy Resident Phone: [phone]/2019,1:40 PM

## 2017-12-26 NOTE — Progress Notes (Signed)
Subjective:  Adam Savage is a 67 y.o. with PMH of HFpEF, COPD, OSA, T2DM, Aortic Valve Replacement, Temporal lobe epilepsy s/p lobectomy admit for right lower extremity swelling on hospital day 4   [name] was examined and evaluated at bedside this a.m.  Observed sleeping comfortably in bed.  States he has had a good night of rest.  States that he was told that his daughter is looking for a facility for him to be discharged to.  States that he is happy about reduction of his lower extremity swelling.  Also noticed that his lower extremity rash has decreased in size considerably.  Nuys any F/N/V/D/C.  Denies any lightheadedness, dizziness, chest pain, palpitations, dyspnea. Objective:  Vital signs in last 24 hours: Vitals:   12/25/17 2051 12/26/17 0450 12/26/17 0921 12/26/17 1342  BP: 124/64 (!) 147/84  (!) 115/57  Pulse: 85 (!) 115  79  Resp: 19 20  19   Temp: 98.3 F (36.8 C) (!) 97.5 F (36.4 C)  97.6 F (36.4 C)  TempSrc: Oral Oral  Oral  SpO2: 98% 98% 96% 97%  Weight:  122.5 kg    Height:       Physical Exam  Constitutional: He is oriented to person, place, and time and well-developed, well-nourished, and in no distress. No distress.  Eyes: Pupils are equal, round, and reactive to light. Conjunctivae and EOM are normal. No scleral icterus.  Neck: Normal range of motion. Neck supple.  Cardiovascular: Normal rate, regular rhythm, normal heart sounds and intact distal pulses.  Pulmonary/Chest: Effort normal. He has wheezes (mild expiratory wheeze improved from prior). He has no rales.  Abdominal: Soft. Bowel sounds are normal.  Musculoskeletal: Normal range of motion. He exhibits edema (1+ pitting edema up to mid shin on left leg. Non-pitting edema with ecchymosis on right leg) and tenderness (Right calf mildly tender to palpation).  Neurological: He is alert and oriented to person, place, and time.  Skin: Skin is warm and dry. Rash (palpable purpura lower extremity bilaterally)  noted. He is not diaphoretic.  Psychiatric: Mood, memory, affect and judgment normal.   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 Baker's Cyst rupture most likely. Was keeping him admitted for heart failure exacerbation with fluid overload. Yesterday reached dry weight. Planned to discharge yesterday but patient's daughter appealing discharge. Patient kept for 24 hours for appeal process.  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 Hep c viral load 243,000. ANCA negative. Cryoglobulin pending. Punch biopsy performed on 12/24/17.  - F/u Punch biopsy  T2DM Fasting this AM 137 -C/wLantus 25units qhs - C/w Lantus 5 units TID WC - Sliding scale aspart - Will discharge on Invokana 100mg   OSA Patient endorsing better sleep with CPAP - c/w CPAP at night  COPD Currently satting 97 on RA.  - C/w Duonebsq6hrs - Will discharge with rescue inhaler Proventil  Hx of Temporal Lobe Epilepsy s/p lobectomy - C/w Home meds: Keppra 1000mg  BID, Trazodone 200mg  qhs, Remeron 30mg  qhs  Hx of Aortic valve replacement INR 2.8 this AM - Warfarin dosing per pharmacy(return to home schedule on discharge)  Hx of NSTEMi - C/w home meds: Asa 81mg  daily, Atorvastatin 10mg  daily  DVT ppx: Coumadin + Lovenox Bowel: Pepcid 20mg  daily Diet: Diabetic/HF diet w/ fluid restriction Code:FULL code  Dispo: Anticipated discharge in approximately today(s).   [name], MD 12/26/2017, 4:54 PM Pager: [phone]

## 2017-12-26 NOTE — Telephone Encounter (Signed)
Pls let daughter know that I reviewed records and it looks like it's an insurance issue, and that they had mentioned assisted living rather than SNF level of care? I'm not quite sure what else to say except that he does have cognitive impairment needing 24/7 supervision. Janett Billow, any thoughts? Thanks

## 2017-12-26 NOTE — Telephone Encounter (Signed)
I was asked to contact patient's daughter as she is concerned that patient will be discharged from hospital and returned back to her home.  She states that she is concerned that the patient is not able to be safe with medication changes and is worried that this is not a positive environment or safe for her daughter either at this time.  Her father's been living her with her since July 1 when he was told that he could no longer live by himself.  She reports that he has no finances to be able to private pay long-term care.  We talked about if the patient was to discharge from the hospital that may be private paying for personal care services may be helpful if she is fearful for him to be left alone at this time.  In addition, this care could be provided while she was working with the home health social worker to find an assisted living home where the patient can live.  The daughter could use some help with determining if the patient has capacity to make decisions for himself.  If he is unable, this will help her to be able to find appropriate placement for him in an assisted living setting that would meet both of his needs from a medical standpoint and from a behavioral standpoint.  She is requesting that there November appointment be moved up.  I will share this with Dr. Delice Lesch.  I tried to encourage communication with the case manager who was assigned to the patient at the hospital to keep continuity and resources aligned.  If she does not feel safe for her father to be in her home and he has resources to pay for assisted living this may be a option upon discharge.  She can also request for social work services to be provided through home health and can be connected with personal care service agencies before discharge as well.  Over 30 minutes was spent with the patient's daughter providing this resource.

## 2017-12-26 NOTE — Progress Notes (Signed)
Patient has home CPAP at bedside. States he doesn't need assistance.

## 2017-12-26 NOTE — Telephone Encounter (Signed)
Spoke with patient's daughter. She states patient medically stable and is on track to be discharged from the hospital. He has been admitted since Sunday. He has been newly diagnosed with diabetes and has new medications from that respect. He was also consulted from psychiatry and they want to change his medications, but told her this wasn't safe to do in a home environment. She is concerned about him being discharged to her home and has appealed it. She wants placement in SNF. She states she was told there wasn't a skilled need for him to be placed. She states he is noncompliant and poses safety concerns in the home from a mental standpoint. She is looking for recommendations of how to speak with the hospital staff to get him placement. Please advise.   (copying Janett Billow, Education officer, museum, for guidance as well)

## 2017-12-26 NOTE — Progress Notes (Signed)
  Date: 12/26/2017  Patient name: Adam Savage  Medical record number: [id number]  Date of birth: 1950-12-01   I have seen and evaluated this patient and I have discussed the plan of care with the house staff. Please see their note for complete details. I concur with their findings with the following additions/corrections: [name] was seen on morning rounds with the team.  Dr. Truman Hayward updated me on disposition issues.  1.  Right leg pain and swelling secondary to clinical diagnosis of ruptured Baker's cyst.  The ecchymosis and swelling continues to significantly improved.  He has been evaluated by PT and OT, who recommend home health services.  2.  Acute on chronic diastolic heart failure - yesterday we achieved his dry weight.  This was on 60 IV Lasix twice daily.  We had intended him to be discharged in the afternoon but because he stayed in the hospital, he got his evening dose of IV Lasix 60 mg.  Today, his weight has decreased 0.8 kg to 122.5.  I's and O's seem to be inaccurate as they show no net diuresis.  Unfortunately, his creatinine increased from 1.6-1.76 likely indicating he was over diuresed yesterday from the extra Lasix dose.  We have cut back on his Lasix to our intended home dose of 60 mg orally daily.  3.  Suspected vasculitis - he has a positive hep C viral load.  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  Mixed cryoglobulinemia is frequently associated with LE palpable purpura, myalgias, fatigue, and arthralgias. We are waiting for serum cryoglobulinemia levels.  4.  Microscopic hematuria -await final vasculitis work-up. Mixed cryoglobulinemia can cause a membranoproliferative glomerulonephritis which can cause hematuria with dysmorphic red cells.  To work this up, we will check C3 and C4 concentration. Tx would be aimed toward Hep C tx rather than immunosuppression if his final dx is Hep C mixed cryro and MPGN.  He  will need outpatient follow-up to finalize diagnosis and treatment.  Await resolution of his dispo issues.  Bartholomew Crews, MD 12/26/2017, 1:13 PM

## 2017-12-26 NOTE — Social Work (Addendum)
CSW spoke with pt daughter Caryl Pina, pt daughter states she has spoken with [facility] and that they will accept pt. Did acknowledge that we could discharge to [facility] but that pt needs to understand what is happening since he continues to voice that he wants to go home. Pt daughter says that she has talked to him and he was in agreement with discharging to SNF, will go by room and discuss with pt again. Discussed concern for potential behaviors if pt is not aware of what his discharge plan is.  CSW explained that pt may be accepted at [facility] but that without continued skilled nursing needs SNF may discharge pt back to pt daughters home. This lack of skilled nursing needs is why pt care team is discussing pt daughter needing to look into assisted living. Pt daughter states that [facility] has "assisted living," which pt could go to if he can't stay at SNF level. Discussed that this is not considered assisted living but LTC which means that pt has care needs that cannot be managed long term at ALF. Also discussed that LTC at SNFs and ALF level care are not covered through Medicare. Pt daughter states understanding.   CSW has left message with Juliann Pulse at [facility] to discuss pt discharge disposition.  4:34pm: Santiago Glad with [facility] confirmed they can take pt tomorrow, will call and update MD care team. For weekend CSW support with discharge please call [phone].   Alexander Mt, [place] Work [phone]

## 2017-12-27 DIAGNOSIS — R569 Unspecified convulsions: Secondary | ICD-10-CM | POA: Diagnosis not present

## 2017-12-27 DIAGNOSIS — G4733 Obstructive sleep apnea (adult) (pediatric): Secondary | ICD-10-CM | POA: Diagnosis not present

## 2017-12-27 DIAGNOSIS — E119 Type 2 diabetes mellitus without complications: Secondary | ICD-10-CM | POA: Diagnosis not present

## 2017-12-27 DIAGNOSIS — I5032 Chronic diastolic (congestive) heart failure: Secondary | ICD-10-CM | POA: Diagnosis not present

## 2017-12-27 DIAGNOSIS — I5030 Unspecified diastolic (congestive) heart failure: Secondary | ICD-10-CM | POA: Diagnosis not present

## 2017-12-27 DIAGNOSIS — I5033 Acute on chronic diastolic (congestive) heart failure: Secondary | ICD-10-CM | POA: Diagnosis not present

## 2017-12-27 DIAGNOSIS — F5109 Other insomnia not due to a substance or known physiological condition: Secondary | ICD-10-CM | POA: Diagnosis not present

## 2017-12-27 DIAGNOSIS — F419 Anxiety disorder, unspecified: Secondary | ICD-10-CM | POA: Diagnosis not present

## 2017-12-27 DIAGNOSIS — G8929 Other chronic pain: Secondary | ICD-10-CM | POA: Diagnosis not present

## 2017-12-27 DIAGNOSIS — M79669 Pain in unspecified lower leg: Secondary | ICD-10-CM | POA: Diagnosis not present

## 2017-12-27 DIAGNOSIS — Z954 Presence of other heart-valve replacement: Secondary | ICD-10-CM | POA: Diagnosis not present

## 2017-12-27 DIAGNOSIS — G4089 Other seizures: Secondary | ICD-10-CM | POA: Diagnosis not present

## 2017-12-27 DIAGNOSIS — F05 Delirium due to known physiological condition: Secondary | ICD-10-CM | POA: Diagnosis not present

## 2017-12-27 DIAGNOSIS — M6281 Muscle weakness (generalized): Secondary | ICD-10-CM | POA: Diagnosis not present

## 2017-12-27 DIAGNOSIS — M545 Low back pain: Secondary | ICD-10-CM | POA: Diagnosis not present

## 2017-12-27 DIAGNOSIS — Z7901 Long term (current) use of anticoagulants: Secondary | ICD-10-CM | POA: Diagnosis not present

## 2017-12-27 DIAGNOSIS — J449 Chronic obstructive pulmonary disease, unspecified: Secondary | ICD-10-CM | POA: Diagnosis not present

## 2017-12-27 DIAGNOSIS — G4730 Sleep apnea, unspecified: Secondary | ICD-10-CM | POA: Diagnosis not present

## 2017-12-27 DIAGNOSIS — M660 Rupture of popliteal cyst: Secondary | ICD-10-CM | POA: Diagnosis not present

## 2017-12-27 DIAGNOSIS — E1165 Type 2 diabetes mellitus with hyperglycemia: Secondary | ICD-10-CM | POA: Diagnosis not present

## 2017-12-27 DIAGNOSIS — D891 Cryoglobulinemia: Secondary | ICD-10-CM | POA: Diagnosis not present

## 2017-12-27 DIAGNOSIS — R451 Restlessness and agitation: Secondary | ICD-10-CM | POA: Diagnosis not present

## 2017-12-27 DIAGNOSIS — I959 Hypotension, unspecified: Secondary | ICD-10-CM | POA: Diagnosis not present

## 2017-12-27 DIAGNOSIS — R279 Unspecified lack of coordination: Secondary | ICD-10-CM | POA: Diagnosis not present

## 2017-12-27 DIAGNOSIS — Z743 Need for continuous supervision: Secondary | ICD-10-CM | POA: Diagnosis not present

## 2017-12-27 DIAGNOSIS — B182 Chronic viral hepatitis C: Secondary | ICD-10-CM | POA: Diagnosis not present

## 2017-12-27 DIAGNOSIS — R5383 Other fatigue: Secondary | ICD-10-CM | POA: Diagnosis not present

## 2017-12-27 DIAGNOSIS — G4700 Insomnia, unspecified: Secondary | ICD-10-CM | POA: Diagnosis not present

## 2017-12-27 DIAGNOSIS — I5021 Acute systolic (congestive) heart failure: Secondary | ICD-10-CM | POA: Diagnosis not present

## 2017-12-27 LAB — GLUCOSE, CAPILLARY
Glucose-Capillary: 125 mg/dL — ABNORMAL HIGH (ref 70–99)
Glucose-Capillary: 179 mg/dL — ABNORMAL HIGH (ref 70–99)
Glucose-Capillary: 218 mg/dL — ABNORMAL HIGH (ref 70–99)
Glucose-Capillary: 175 mg/dL — ABNORMAL HIGH (ref 70–99)

## 2017-12-27 LAB — PROTIME-INR
INR: 2.78
Prothrombin Time: 29.1 seconds — ABNORMAL HIGH (ref 11.4–15.2)

## 2017-12-27 MED ORDER — WARFARIN SODIUM 5 MG PO TABS
5.0000 mg | ORAL_TABLET | Freq: Once | ORAL | Status: AC
  Administered 2017-12-27: 5 mg via ORAL
  Filled 2017-12-27: qty 1

## 2017-12-27 MED ORDER — ALBUTEROL SULFATE (2.5 MG/3ML) 0.083% IN NEBU: 2.5000 mg | INHALATION_SOLUTION | RESPIRATORY_TRACT | Status: DC | PRN

## 2017-12-27 MED ORDER — IPRATROPIUM-ALBUTEROL 0.5-2.5 (3) MG/3ML IN SOLN
3.0000 mL | Freq: Three times a day (TID) | RESPIRATORY_TRACT | Status: DC
  Administered 2017-12-27 (×2): 3 mL via RESPIRATORY_TRACT
  Filled 2017-12-27 (×2): qty 3

## 2017-12-27 NOTE — Progress Notes (Signed)
   Subjective:  Othello Dickenson is a 67 y.o. with PMH of HFpEF, COPD, OSA, T2DM, Aortic Valve Replacement, Temporal lobe epilepsy s/p lobectomy admit for right lower extremity swelling on hospital day 5   [name] was examined and evaluated at bedside this a.m.  Observed resting comfortably in bed. Looking forward to leaving the hospital. Denies any F/N/V/D/C.  Denies any lightheadedness, dizziness, chest pain, palpitations, dyspnea.  Objective:  Vital signs in last 24 hours: Vitals:   12/26/17 2037 12/26/17 2046 12/27/17 0532 12/27/17 0841  BP:  119/67 (!) 142/69   Pulse:  80 89   Resp:  16 18   Temp:  98.1 F (36.7 C) 97.7 F (36.5 C)   TempSrc:  Oral Oral   SpO2: 97% 99% 96% 93%  Weight:   122.3 kg   Height:       Physical Exam  Constitutional: He is oriented to person, place, and time and well-developed, well-nourished, and in no distress. No distress.  Eyes: Pupils are equal, round, and reactive to light. Conjunctivae and EOM are normal. No scleral icterus.  Neck: Normal range of motion. Neck supple.  Cardiovascular: Normal rate, regular rhythm, normal heart sounds and intact distal pulses.  Pulmonary/Chest: Effort normal. He has no wheezes. He has no rales.  Abdominal: Soft. Bowel sounds are normal.  Musculoskeletal: Normal range of motion. He exhibits edema (1trace pitting edema bilaterally). He exhibits no tenderness.  Neurological: He is alert and oriented to person, place, and time.  Skin: Skin is warm and dry. Rash (palpable purpura lower extremity bilaterally) noted. He is not diaphoretic.  Psychiatric: Mood, memory, affect and judgment normal.   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 Pain currently resolved. Planned discharge on Thursday but patient's daughter want him at [facility]  for re-evaluation. Will be discharged today to SNF.  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 Hep c viral load 243,000. ANCA negative. Cryoglobulin pending. 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  - C/w Duonebsq6hrs - Will discharge with rescue inhaler Proventil  Hx of Temporal Lobe Epilepsy s/p lobectomy - C/w Home meds: Keppra 1000mg  BID, Trazodone 200mg  qhs, Remeron 30mg  qhs  Hx of Aortic valve replacement INR 2.8 this AM - Warfarin dosing per pharmacy(return to home schedule on discharge)  Hx of NSTEMi - C/w home meds: Asa 81mg  daily, Atorvastatin 10mg  daily  DVT ppx: Coumadin + Lovenox Bowel: Pepcid 20mg  daily Diet: Diabetic/HF diet w/ fluid restriction Code:FULL code  Dispo: Anticipated discharge in approximately today(s).   [name], MD 12/27/2017, 1:37 PM Pager: [phone]

## 2017-12-27 NOTE — Plan of Care (Signed)

## 2017-12-27 NOTE — Progress Notes (Signed)
Attempted to give report to receiving RN at [facility], unsuccessful due to no one answering the phone. LCSW notified. Will continue to attempt to contact.

## 2017-12-27 NOTE — Progress Notes (Signed)
Clinical Social Worker facilitated patient discharge including contacting patient family and facility to confirm patient discharge plans.  Clinical information faxed to facility and family agreeable with plan.  CSW arranged ambulance transport via [facility] to [facility] .  RN to call for report prior to discharge [phone] Room 125 A.  Clinical Social Worker will sign off for now as social work intervention is no longer needed. Please consult [place] again if new need arises.  Mahamad Mose Colaizzi LCSW  [phone]

## 2017-12-27 NOTE — Progress Notes (Signed)
RN Company secretary at [employer] that pt. Was not given insulin due to order parameters not being met. Linus Orn stated she will notify her in-house nurse practitoner to possibly get an order for a sliding scale.

## 2017-12-27 NOTE — Progress Notes (Signed)
Spoke with representative from [facility] states that this patient is currently number 2 on the waiting list of transportation, representative also stated they had an extremely long list of transports today which explains the delay.   Frederick Lanae Boast, RN

## 2017-12-27 NOTE — Progress Notes (Signed)
ANTICOAGULATION CONSULT NOTE - Follow-Up Consult  Pharmacy Consult for Coumadin Indication: mechanical valve (s/p AVR 2002)  Allergies  Allergen Reactions  . Morphine And Related Other (See Comments)    Combative     Patient Measurements: Height: 6' (182.9 cm) Weight: 269 lb 10 oz (122.3 kg) IBW/kg (Calculated) : 77.6  Vital Signs: Temp: 97.7 F (36.5 C) (09/14 0532) Temp Source: Oral (09/14 0532) BP: 142/69 (09/14 0532) Pulse Rate: 89 (09/14 0532)  Labs: Recent Labs    12/25/17 0419 12/26/17 0441 12/26/17 0445 12/27/17 0513  LABPROT 25.3* 29.3*  --  29.1*  INR 2.32 2.80  --  2.78  CREATININE 1.60*  --  1.76*  --     Estimated Creatinine Clearance: 55 mL/min (A) (by C-G formula based on SCr of 1.76 mg/dL (H)).   Medical History: Past Medical History:  Diagnosis Date  . COPD (chronic obstructive pulmonary disease) ([place])   . Diabetes mellitus without complication ([place])   . Mechanical heart valve present   . Pre-diabetes   . Seizures ([place])     Medications:  Medications Prior to Admission  Medication Sig Dispense Refill Last Dose  . acetaminophen (TYLENOL) 650 MG CR tablet Take 1,300 mg by mouth every 8 (eight) hours as needed for pain.   12/21/2017 at 1630  . aspirin EC 81 MG tablet Take 81 mg by mouth daily.   12/21/2017 at am  . atorvastatin (LIPITOR) 10 MG tablet Take 1 tablet (10 mg total) by mouth daily. 30 tablet 0 12/21/2017 at am  . carvedilol (COREG) 6.25 MG tablet Take 1 tablet (6.25 mg total) by mouth 2 (two) times daily with a meal. 180 tablet 3 12/21/2017 at 900  . levETIRAcetam (KEPPRA) 1000 MG tablet Take 1 tablet (1,000 mg total) by mouth 2 (two) times daily. 180 tablet 3 12/21/2017 at am  . mirtazapine (REMERON) 30 MG tablet Take 30 mg by mouth at bedtime.  10 12/20/2017 at pm  . ramipril (ALTACE) 5 MG capsule Take 1 capsule (5 mg total) by mouth daily. 90 capsule 3 12/21/2017 at am  . ranitidine (ZANTAC) 150 MG tablet Take 150 mg by mouth 2 (two) times  daily.    12/21/2017 at am  . tamsulosin (FLOMAX) 0.4 MG CAPS capsule Take 0.4 mg by mouth at bedtime.    12/20/2017 at pm  . traZODone (DESYREL) 100 MG tablet Take 200 mg by mouth at bedtime.  4 12/20/2017 at pm  . warfarin (COUMADIN) 5 MG tablet Take 5 mg by mouth See admin instructions. Take one tablet (5 mg) by mouth Monday, Tuesday, Wednesday, Friday, Saturday nights - 9pm   12/20/2017 at 2100  . warfarin (COUMADIN) 7.5 MG tablet Take 7.5 mg by mouth See admin instructions. Take one tablet (7.5 mg) by mouth on Sunday and Thursday nights - 9pm (take 5 mg tablet on all other days of the week)   12/18/2017 at 2100  . blood glucose meter kit and supplies Dispense based on patient and insurance preference. Use up to four times daily as directed. (FOR ICD-10 E10.9, E11.9). 1 each 0 Taking  . mirtazapine (REMERON) 15 MG tablet Take 1 tablet at night for 1 week, then increase to 2 tablets at night (Patient not taking: Reported on 12/21/2017) 60 tablet 11 Not Taking at Unknown time    Assessment: 67 y.o. M on warfarin PTA for mechanical aortic valve (2004). His home dose is 34m daily except for 7.543mon Sunday and Thursday. INR was therapeutic (2.43)  on admission, but unfortunately patient missed 7.5 mg Sunday dose (9/8). After looking at historical records, it's unclear as to why the patient's INR goal is 2-3 for his mechanical aortic valve. His last cardiology visit at [facility] on 07/01/2017 stated his INR goal to be 3-3.5. Based on this information, I will continue his PTA warfarin dose today to ensure adequate anticoagulation for his mechanical valve.     During this admission, the patient's INR was subtherapeutic on 9/10 and 9/11, so the patient was started on Lovenox (which has since been discontinued). Today, the INR continues to be therapeutic (2.78).   Patient's CBC is stable this AM, no bleeding noted on rounds with patient.  Goal of Therapy:  INR 2-3  Monitor platelets by anticoagulation protocol: Yes    Plan: Warfarin 54m x 1 today Daily INR Resume home PTA warfarin regimen at discharge  JSloan Leiter PharmD, BCPS, BHendricksPharmacist Clinical phone 12/27/2017 until 3:30PM -- #[phone] refer to AHarris[facility] Surgery [facility] Incfor MFloridatownnumbers 12/27/2017,7:40 AM

## 2017-12-29 DIAGNOSIS — F05 Delirium due to known physiological condition: Secondary | ICD-10-CM | POA: Diagnosis not present

## 2017-12-29 DIAGNOSIS — M6281 Muscle weakness (generalized): Secondary | ICD-10-CM | POA: Diagnosis not present

## 2017-12-29 DIAGNOSIS — R5383 Other fatigue: Secondary | ICD-10-CM | POA: Diagnosis not present

## 2017-12-29 DIAGNOSIS — R451 Restlessness and agitation: Secondary | ICD-10-CM | POA: Diagnosis not present

## 2017-12-29 DIAGNOSIS — G4089 Other seizures: Secondary | ICD-10-CM | POA: Diagnosis not present

## 2017-12-31 DIAGNOSIS — M6281 Muscle weakness (generalized): Secondary | ICD-10-CM | POA: Diagnosis not present

## 2017-12-31 DIAGNOSIS — F05 Delirium due to known physiological condition: Secondary | ICD-10-CM | POA: Diagnosis not present

## 2017-12-31 DIAGNOSIS — R5383 Other fatigue: Secondary | ICD-10-CM | POA: Diagnosis not present

## 2017-12-31 DIAGNOSIS — G4089 Other seizures: Secondary | ICD-10-CM | POA: Diagnosis not present

## 2017-12-31 DIAGNOSIS — M79669 Pain in unspecified lower leg: Secondary | ICD-10-CM | POA: Diagnosis not present

## 2017-12-31 DIAGNOSIS — R451 Restlessness and agitation: Secondary | ICD-10-CM | POA: Diagnosis not present

## 2017-12-31 NOTE — Telephone Encounter (Signed)
Spoke w/ pt's daughter, she states he is in a rehab facility at this time, she states pt needs a pcp and she hopes he will be able to be seen in [facility] after disch from rehab, states everyone from [facility] was wonderful while pt was in [place].  She is advised to call appr 1 week from disch from rehab and ask for a new pt slot, she is agreeable

## 2018-01-01 ENCOUNTER — Ambulatory Visit: Payer: Medicare Other

## 2018-01-01 ENCOUNTER — Encounter: Payer: Self-pay | Admitting: Cardiology

## 2018-01-02 DIAGNOSIS — Z7901 Long term (current) use of anticoagulants: Secondary | ICD-10-CM | POA: Diagnosis not present

## 2018-01-02 DIAGNOSIS — G4733 Obstructive sleep apnea (adult) (pediatric): Secondary | ICD-10-CM | POA: Diagnosis not present

## 2018-01-02 DIAGNOSIS — E119 Type 2 diabetes mellitus without complications: Secondary | ICD-10-CM | POA: Diagnosis not present

## 2018-01-02 DIAGNOSIS — I5033 Acute on chronic diastolic (congestive) heart failure: Secondary | ICD-10-CM | POA: Diagnosis not present

## 2018-01-05 DIAGNOSIS — G8929 Other chronic pain: Secondary | ICD-10-CM | POA: Diagnosis not present

## 2018-01-05 DIAGNOSIS — M6281 Muscle weakness (generalized): Secondary | ICD-10-CM | POA: Diagnosis not present

## 2018-01-05 DIAGNOSIS — E1165 Type 2 diabetes mellitus with hyperglycemia: Secondary | ICD-10-CM | POA: Diagnosis not present

## 2018-01-05 DIAGNOSIS — F05 Delirium due to known physiological condition: Secondary | ICD-10-CM | POA: Diagnosis not present

## 2018-01-05 DIAGNOSIS — M545 Low back pain: Secondary | ICD-10-CM | POA: Diagnosis not present

## 2018-01-06 LAB — IMMUNOFIXATION ELECT: Cryoglobulin %: 1 % — ABNORMAL HIGH

## 2018-01-06 LAB — CRYOGLOBULIN

## 2018-01-08 DIAGNOSIS — J449 Chronic obstructive pulmonary disease, unspecified: Secondary | ICD-10-CM | POA: Diagnosis not present

## 2018-01-08 DIAGNOSIS — I5032 Chronic diastolic (congestive) heart failure: Secondary | ICD-10-CM | POA: Diagnosis not present

## 2018-01-08 DIAGNOSIS — M6281 Muscle weakness (generalized): Secondary | ICD-10-CM | POA: Diagnosis not present

## 2018-01-08 DIAGNOSIS — F5109 Other insomnia not due to a substance or known physiological condition: Secondary | ICD-10-CM | POA: Diagnosis not present

## 2018-01-08 DIAGNOSIS — E1165 Type 2 diabetes mellitus with hyperglycemia: Secondary | ICD-10-CM | POA: Diagnosis not present

## 2018-01-09 ENCOUNTER — Telehealth: Payer: Self-pay | Admitting: *Deleted

## 2018-01-09 DIAGNOSIS — F419 Anxiety disorder, unspecified: Secondary | ICD-10-CM | POA: Diagnosis not present

## 2018-01-09 DIAGNOSIS — F05 Delirium due to known physiological condition: Secondary | ICD-10-CM | POA: Diagnosis not present

## 2018-01-09 DIAGNOSIS — G4700 Insomnia, unspecified: Secondary | ICD-10-CM | POA: Diagnosis not present

## 2018-01-09 NOTE — Telephone Encounter (Signed)
Patient's daughter called, asking if a referral was ever sent to [facility] after patient's hospitalization for recent diagnosis of hepatitis C.  He was discharged from [facility] to [facility] for rehab. He now needs transfer from rehab facility to assisted living facility ([facility]), but needs Hepatitis C addressed before they will accept him. Patient is currently paying out of pocket for 2 rooms until he is able to transfer to assisted living facility. Patient scheduled for appointment with Dr Linus Salmons 9/30 at 11:15. RN called [place] facility at [phone] to relay appointment information. Landis Gandy, RN

## 2018-01-12 ENCOUNTER — Encounter: Payer: Self-pay | Admitting: General Practice

## 2018-01-12 ENCOUNTER — Ambulatory Visit (INDEPENDENT_AMBULATORY_CARE_PROVIDER_SITE_OTHER): Payer: Medicare Other | Admitting: Internal Medicine

## 2018-01-12 ENCOUNTER — Telehealth: Payer: Self-pay | Admitting: *Deleted

## 2018-01-12 ENCOUNTER — Encounter: Payer: Self-pay | Admitting: Internal Medicine

## 2018-01-12 ENCOUNTER — Ambulatory Visit: Payer: Medicare Other

## 2018-01-12 DIAGNOSIS — B182 Chronic viral hepatitis C: Secondary | ICD-10-CM

## 2018-01-12 DIAGNOSIS — E1165 Type 2 diabetes mellitus with hyperglycemia: Secondary | ICD-10-CM | POA: Diagnosis not present

## 2018-01-12 DIAGNOSIS — I5032 Chronic diastolic (congestive) heart failure: Secondary | ICD-10-CM | POA: Diagnosis not present

## 2018-01-12 DIAGNOSIS — F5109 Other insomnia not due to a substance or known physiological condition: Secondary | ICD-10-CM | POA: Diagnosis not present

## 2018-01-12 DIAGNOSIS — F05 Delirium due to known physiological condition: Secondary | ICD-10-CM | POA: Diagnosis not present

## 2018-01-12 NOTE — Telephone Encounter (Signed)
Daughter called.  Pt recently D/C homefrom hospital CHF and tested + for Hep C.  Will be treated after results of blood work come back.  Medicines use to tx Hep C can lower effectiveness of coumadin.  She is trying to get her Dad into ALF and just needed questions answered.  Daughter appreciative of promptness of return call and answers to her questions.  Will follow back up with me son if pt is not placed in ALF.

## 2018-01-12 NOTE — Progress Notes (Signed)
[place] for Infectious Disease   CC: consideration for treatment for chronic hepatitis C  HPI:  +Adam Savage is a 67 y.o. male who presents for initial evaluation and management of chronic hepatitis C.  Patient tested positive during his recent hospitalization. Hepatitis C-associated risk factors present are: none. Patient denies intranasal drug use, IV drug abuse. Patient has had other studies performed. Results: hepatitis C RNA by PCR, result: positive. Patient has had prior treatment for Hepatitis C with interferon-based therapy (likely with ribavirin), the daughter feels was over 10 years ago. Felt to have completed the total duration, but unsure. Patient does not have a past history of liver disease. Patient does not have a family history of liver disease. Patient does not  have associated signs or symptoms related to liver disease, though has had vasculitis thought to be associated with hepatitis C.  Labs reviewed and confirm chronic hepatitis C with a positive viral load.   Records reviewed from Epic of his recent hospitalization.  He was found to have small vessel leukocytoclastic vasculitis and work up only revealed a positive HCV RNA positive test.  ANCA, Myeloperoxidase Abs negative. Cryoglobulin positive.      Patient does not have documented immunity to Hepatitis A. Patient does not have documented immunity to Hepatitis B.    Review of Systems:  Constitutional: positive for fatigue Integument/breast: negative for rash with waxing and waning vasculitis All other systems reviewed and are negative       Past Medical History:  Diagnosis Date  . COPD (chronic obstructive pulmonary disease) ([place])   . Diabetes mellitus without complication ([place])   . Mechanical heart valve present   . Pre-diabetes   . Seizures ([place])     Prior to Admission medications   Medication Sig Start Date End Date Taking? Authorizing Provider  acetaminophen (TYLENOL) 650 MG CR tablet Take 1,300  mg by mouth every 8 (eight) hours as needed for pain.    [provider]  albuterol (PROVENTIL HFA;VENTOLIN HFA) 108 (90 Base) MCG/ACT inhaler Inhale 2 puffs into the lungs every 6 (six) hours as needed for wheezing or shortness of breath. 12/25/17   [name], MD  aspirin EC 81 MG tablet Take 81 mg by mouth daily.    [provider]  atorvastatin (LIPITOR) 10 MG tablet Take 1 tablet (10 mg total) by mouth daily. 11/04/17 01/20/18  Arrien, Jimmy Picket, MD  blood glucose meter kit and supplies Dispense based on patient and insurance preference. Use up to four times daily as directed. (FOR ICD-10 E10.9, E11.9). 11/04/17   Arrien, Jimmy Picket, MD  canagliflozin [facility]) 100 MG TABS tablet Take 1 tablet (100 mg total) by mouth daily before breakfast. 12/25/17 01/24/18  [name], MD  carvedilol (COREG) 6.25 MG tablet Take 1 tablet (6.25 mg total) by mouth 2 (two) times daily with a meal. 11/27/17 12/27/17  Jettie Booze, MD  furosemide (LASIX) 20 MG tablet Take 3 tablets (60 mg total) by mouth daily. 12/25/17 12/25/18  [name], MD  levETIRAcetam (KEPPRA) 1000 MG tablet Take 1 tablet (1,000 mg total) by mouth 2 (two) times daily. 11/14/17   Cameron Sprang, MD  mirtazapine (REMERON) 30 MG tablet Take 30 mg by mouth at bedtime. 12/07/17   [provider]  ramipril (ALTACE) 5 MG capsule Take 1 capsule (5 mg total) by mouth daily. 11/27/17   Jettie Booze, MD  ranitidine (ZANTAC) 150 MG tablet Take 150 mg by  mouth 2 (two) times daily.  05/01/17   [provider]  tamsulosin (FLOMAX) 0.4 MG CAPS capsule Take 0.4 mg by mouth at bedtime.  05/01/17   [provider]  traZODone (DESYREL) 100 MG tablet Take 200 mg by mouth at bedtime. 10/15/17   [provider]  warfarin (COUMADIN) 5 MG tablet Take 5 mg by mouth See admin instructions. Take one tablet (5 mg) by mouth Monday, Tuesday, Wednesday, Friday, Saturday nights - 9pm 05/02/17    [provider]  warfarin (COUMADIN) 7.5 MG tablet Take 7.5 mg by mouth See admin instructions. Take one tablet (7.5 mg) by mouth on Sunday and Thursday nights - 9pm (take 5 mg tablet on all other days of the week) 05/01/17   [provider]    Allergies  Allergen Reactions  . Morphine And Related Other (See Comments)    Combative     Social History   Tobacco Use  . Smoking status: Current Every Day Smoker    Packs/day: 1.00    Types: Cigarettes  . Smokeless tobacco: Never Used  Substance Use Topics  . Alcohol use: Not Currently  . Drug use: Never    Family History  Problem Relation Age of Onset  . Hypertension Father      Objective:  Constitutional: in no apparent distress,  Vitals:   01/12/18 1059  Weight: 274 lb (124.3 kg)   Eyes: anicteric Cardiovascular: Cor RRR Respiratory: CTA B; normal respiratory effort Gastrointestinal: Bowel sounds are normal Musculoskeletal: trace edema Skin: + small petechiae on both lower part of both lower extremities; no porphyria cutanea tarda Lymphatic: no cervical lymphadenopathy   Laboratory Genotype: No results found for: HCVGENOTYPE HCV viral load:  Lab Results  Component Value Date   HCVQUANT 243,000 12/22/2017   Lab Results  Component Value Date   WBC 8.1 12/24/2017   HGB 9.7 (L) 12/24/2017   HCT 30.0 (L) 12/24/2017   MCV 82.2 12/24/2017   PLT 166 12/24/2017    Lab Results  Component Value Date   CREATININE 1.76 (H) 12/26/2017   BUN 38 (H) 12/26/2017   NA 136 12/26/2017   K 4.0 12/26/2017   CL 99 12/26/2017   CO2 27 12/26/2017    Lab Results  Component Value Date   ALT 40 12/23/2017   AST 57 (H) 12/23/2017   ALKPHOS 165 (H) 12/23/2017     Labs and history reviewed and show CHILD-PUGH unknown, patient is on coumadin  5-6 points: Child class A 7-9 points: Child class B 10-15 points: Child class C  Lab Results  Component Value Date   INR 2.78 12/27/2017   BILITOT 1.0 12/23/2017    ALBUMIN 2.9 (L) 12/23/2017     Assessment: New Patient with Chronic Hepatitis C genotype unknown, untreated.  I discussed with the patient the lab findings that confirm chronic hepatitis C as well as the natural history and progression of disease including about 30% of people who develop cirrhosis of the liver if left untreated and once cirrhosis is established there is a 2-7% risk per year of liver cancer and liver failure.  I discussed the importance of treatment and benefits in reducing the risk, even if significant liver fibrosis exists.   Plan: 1) Patient counseled extensively on limiting acetaminophen to no more than 2 grams daily, avoidance of alcohol. 2) Transmission discussed with patient including sexual transmission, sharing razors and toothbrush.   3) Will need referral to gastroenterology if concern for cirrhosis 5) Will prescribe appropriate  medication based on genotype and coverage  6) Hepatitis A and B titers 7) Pneumovax vaccine previously given in July 8) Further work up to include liver staging with elastography 9) will follow up after starting medication, will get it approved after labs and ultrasound are done  10) will notify his coumadin clinic (Dr. Verda Cumins) that medication may decrease the INR some

## 2018-01-12 NOTE — Telephone Encounter (Signed)
Would like to speak with Adam Savage regarding INR questions. / tg

## 2018-01-13 ENCOUNTER — Telehealth: Payer: Self-pay | Admitting: *Deleted

## 2018-01-13 ENCOUNTER — Other Ambulatory Visit: Payer: Self-pay | Admitting: Internal Medicine

## 2018-01-13 DIAGNOSIS — I5031 Acute diastolic (congestive) heart failure: Secondary | ICD-10-CM | POA: Diagnosis not present

## 2018-01-13 DIAGNOSIS — Z952 Presence of prosthetic heart valve: Secondary | ICD-10-CM

## 2018-01-13 DIAGNOSIS — D692 Other nonthrombocytopenic purpura: Secondary | ICD-10-CM | POA: Diagnosis not present

## 2018-01-13 DIAGNOSIS — Z794 Long term (current) use of insulin: Secondary | ICD-10-CM | POA: Diagnosis not present

## 2018-01-13 DIAGNOSIS — R7303 Prediabetes: Secondary | ICD-10-CM

## 2018-01-13 DIAGNOSIS — E119 Type 2 diabetes mellitus without complications: Secondary | ICD-10-CM | POA: Diagnosis not present

## 2018-01-13 DIAGNOSIS — B182 Chronic viral hepatitis C: Secondary | ICD-10-CM | POA: Diagnosis not present

## 2018-01-13 DIAGNOSIS — M660 Rupture of popliteal cyst: Secondary | ICD-10-CM | POA: Diagnosis not present

## 2018-01-13 MED ORDER — LANCETS MISC: 1.0000 [IU] | Freq: Three times a day (TID) | 3 refills | Status: DC

## 2018-01-13 MED ORDER — GLUCOSE BLOOD VI STRP: ORAL_STRIP | 12 refills | Status: DC

## 2018-01-13 NOTE — Progress Notes (Signed)
Request for script of glucometer strip and lancets. Rx sent.

## 2018-01-13 NOTE — Telephone Encounter (Signed)
Call from [place], [place]. Stated pt did not go to Assisted Living but home with his daughter. 1. Requesting verbal order for OT/PT -told her I need to ask Dr Truman Hayward and call her back. 2. Needs rx for One touch verio test strips and lancets - send to [facility] [place] Thanks

## 2018-01-13 NOTE — Telephone Encounter (Signed)
OT/PT sounds good. I'll send the test strips and lancets. Thank you.

## 2018-01-13 NOTE — Telephone Encounter (Signed)
Please call Kathlee Nations w/ [facility] [phone], needing to know when his next INR is

## 2018-01-13 NOTE — Telephone Encounter (Signed)
Left VM for Kathlee Nations to check INR on patient within the next week. D/C from [facility] 9/14.  INR was 2.78

## 2018-01-14 ENCOUNTER — Ambulatory Visit (INDEPENDENT_AMBULATORY_CARE_PROVIDER_SITE_OTHER): Payer: Medicare Other | Admitting: Internal Medicine

## 2018-01-14 ENCOUNTER — Encounter: Payer: Self-pay | Admitting: Internal Medicine

## 2018-01-14 VITALS — BP 112/52 | HR 73 | Temp 98.0°F | Wt 277.0 lb

## 2018-01-14 DIAGNOSIS — B182 Chronic viral hepatitis C: Secondary | ICD-10-CM | POA: Diagnosis not present

## 2018-01-14 DIAGNOSIS — K6289 Other specified diseases of anus and rectum: Secondary | ICD-10-CM | POA: Diagnosis not present

## 2018-01-14 DIAGNOSIS — I5030 Unspecified diastolic (congestive) heart failure: Secondary | ICD-10-CM

## 2018-01-14 DIAGNOSIS — D692 Other nonthrombocytopenic purpura: Secondary | ICD-10-CM | POA: Diagnosis not present

## 2018-01-14 DIAGNOSIS — I38 Endocarditis, valve unspecified: Secondary | ICD-10-CM | POA: Diagnosis not present

## 2018-01-14 DIAGNOSIS — L304 Erythema intertrigo: Secondary | ICD-10-CM | POA: Diagnosis not present

## 2018-01-14 DIAGNOSIS — Z952 Presence of prosthetic heart valve: Secondary | ICD-10-CM | POA: Diagnosis not present

## 2018-01-14 DIAGNOSIS — E119 Type 2 diabetes mellitus without complications: Secondary | ICD-10-CM | POA: Diagnosis not present

## 2018-01-14 DIAGNOSIS — Z79899 Other long term (current) drug therapy: Secondary | ICD-10-CM

## 2018-01-14 DIAGNOSIS — Z7982 Long term (current) use of aspirin: Secondary | ICD-10-CM | POA: Diagnosis not present

## 2018-01-14 DIAGNOSIS — I5031 Acute diastolic (congestive) heart failure: Secondary | ICD-10-CM | POA: Diagnosis not present

## 2018-01-14 DIAGNOSIS — Z7901 Long term (current) use of anticoagulants: Secondary | ICD-10-CM | POA: Diagnosis not present

## 2018-01-14 DIAGNOSIS — K648 Other hemorrhoids: Secondary | ICD-10-CM | POA: Diagnosis not present

## 2018-01-14 DIAGNOSIS — F17210 Nicotine dependence, cigarettes, uncomplicated: Secondary | ICD-10-CM

## 2018-01-14 DIAGNOSIS — R159 Full incontinence of feces: Secondary | ICD-10-CM | POA: Diagnosis not present

## 2018-01-14 DIAGNOSIS — K625 Hemorrhage of anus and rectum: Secondary | ICD-10-CM

## 2018-01-14 DIAGNOSIS — Z794 Long term (current) use of insulin: Secondary | ICD-10-CM | POA: Diagnosis not present

## 2018-01-14 DIAGNOSIS — M660 Rupture of popliteal cyst: Secondary | ICD-10-CM | POA: Diagnosis not present

## 2018-01-14 LAB — GLUCOSE, CAPILLARY: Glucose-Capillary: 250 mg/dL — ABNORMAL HIGH (ref 70–99)

## 2018-01-14 MED ORDER — HYDROCORTISONE ACETATE 25 MG RE SUPP: 25.0000 mg | Freq: Two times a day (BID) | RECTAL | 0 refills | Status: DC | PRN

## 2018-01-14 MED ORDER — CLOTRIMAZOLE 1 % EX CREA: 1.0000 "application " | TOPICAL_CREAM | Freq: Two times a day (BID) | CUTANEOUS | 0 refills | Status: DC

## 2018-01-14 NOTE — Progress Notes (Signed)
   CC: Hemorrhoids   HPI:  [name] is a 67 y.o. male with past medical history outlined below here for evaluation of hemorrhoids. For the details of today's visit, please refer to the assessment and plan.  Past Medical History:  Diagnosis Date  . COPD (chronic obstructive pulmonary disease) ([place])   . Diabetes mellitus without complication ([place])   . Mechanical heart valve present   . Pre-diabetes   . Seizures ([place])     Review of Systems  Constitutional: Negative for chills and fever.  Gastrointestinal:       Rectal bleeding and pain     Physical Exam:  Vitals:   01/14/18 1118  BP: (!) 112/52  Pulse: 73  Temp: 98 F (36.7 C)  TempSrc: Oral  SpO2: 100%  Weight: 277 lb (125.6 kg)    Constitutional: NAD, appears comfortable Cardiovascular: RRR, no murmurs, rubs, or gallops.  Pulmonary/Chest: CTAB, no wheezes, rales, or rhonchi.  Rectal: exam without evidence of external hemorrhoids, mild fecal incontinence, intertrigo   Extremities: Warm and well perfused. 1+ pitting edema Psychiatric: Normal mood and affect  Assessment & Plan:   See Encounters Tab for problem based charting.  Patient seen with Dr. Eppie Gibson

## 2018-01-14 NOTE — Assessment & Plan Note (Signed)
On warfarin for mechanical aortic valve replacement. Follows with the coumadin clinic at [facility] in [place].

## 2018-01-14 NOTE — Assessment & Plan Note (Signed)
While in the hospital patient was noted to have palpable purpura of his lower extremities and a history of chronic hep C.  Hep C quant RNA was checked and elevated, 243, 000.  ANCA was negative.  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 Although non specific, etiology was felt to be hep C vasculitis. He was seen by infectious disease on 9/30. Planning to start treatment pending further work-up and liver imaging.

## 2018-01-14 NOTE — Assessment & Plan Note (Signed)
Patient has significant perianal and gluteal cleft intertrigo. He reports issues with fecal incontinence, and keeping the area dry. Incontinence may be due to his internal hemorrhoids, which we are treating with suppositories. I am hopeful his incontinence will improve with treatment. If not - patient may require further work up.  -- Clotrimazole cream 1% BID x 2 weeks -- Follow up 2-4 weeks

## 2018-01-14 NOTE — Assessment & Plan Note (Addendum)
Patient was recently hospitalized for HFpEF exacerbation.  He presented with lower extremity swelling, shortness of breath, and weight gain.  He was successfully diuresed with IV lasix, discharged on lasix 60 mg daily (previously not on home diuretics). Weight on discharge was 269 lbs (122.3 kg). Weight is up 7lbs since discharge, however on exam lower extremity swelling is mild and lungs are clear. He denies SOB and is saturating 100% on RA. Will hold off on increasing lasix for now. Follow clinically, consider increased dose at follow up if needed.  -- Continue lasix 60 mg daily -- F/u BMP

## 2018-01-14 NOTE — Telephone Encounter (Signed)
Returned call to [place]. Given VO for PT/OT. Let her know [facility]. Kathlee Nations reports patient unable to obtain novolog 2/2 cost $400. Patient has HFU today. Have spoken with Pharm D who will try to assist patient in obtaining novolog. Hubbard Hartshorn, RN, BSN

## 2018-01-14 NOTE — Assessment & Plan Note (Signed)
Patient is complaining of painful bright red blood per rectum. Pain is severe, limiting his ability to sit comfortably. He has been using OTC preparation H for hemorrhoids. On exam, I do not appreciate any external hemorrhoids. However, does have significant intertrigo. Advised patient to discontinue OTC topicals, will start suppositories instead.  -- Advised sitz baths if possible (daughter is concerned with mobility and safety getting in and out of tub) -- Start Anusol-HC suppository BID prn  -- Stop OTC topicals  -- Follow up 2-4 weeks

## 2018-01-14 NOTE — Patient Instructions (Addendum)
Adam Savage,  It was a pleasure to meet you. I am sorry to hear you have not been feeling well. I have given you a rectal suppository for your hemorrhoids. You may use the twice a day as needed. I have also prescribed an anti fungal cream to apply to your rash. Use this twice a day for 2 weeks. Continue to take your other medications as previously prescribed. I will call you with the results of your blood work today. If you have any questions or concerns, call our clinic at [phone] or after hours call [phone] and ask for the internal medicine resident on call. Thank you!  Dr. Philipp Ovens    How to Take a Sitz Bath A sitz bath is a warm water bath that is taken while you are sitting down. The water should only come up to your hips and should cover your buttocks. Your health care provider may recommend a sitz bath to help you:  Clean the lower part of your body, including your genital area.  With itching.  With pain.  With sore muscles or muscles that tighten or spasm.  How to take a sitz bath Take 3-4 sitz baths per day or as told by your health care provider. 1. Partially fill a bathtub with warm water. You will only need the water to be deep enough to cover your hips and buttocks when you are sitting in it. 2. If your health care provider told you to put medicine in the water, follow the directions exactly. 3. Sit in the water and open the tub drain a little. 4. Turn on the warm water again to keep the tub at the correct level. Keep the water running constantly. 5. Soak in the water for 15-20 minutes or as told by your health care provider. 6. After the sitz bath, pat the affected area dry first. Do not rub it. 7. Be careful when you stand up after the sitz bath because you may feel dizzy.  Contact a health care provider if:  Your symptoms get worse. Do not continue with sitz baths if your symptoms get worse.  You have new symptoms. Do not continue with sitz baths until you talk  with your health care provider. This information is not intended to replace advice given to you by your health care provider. Make sure you discuss any questions you have with your health care provider. Document Released: 12/23/2003 Document Revised: 08/30/2015 Document Reviewed: 03/30/2014 Elsevier Interactive Patient Education  [employer].

## 2018-01-15 ENCOUNTER — Other Ambulatory Visit: Payer: Self-pay

## 2018-01-15 ENCOUNTER — Ambulatory Visit (INDEPENDENT_AMBULATORY_CARE_PROVIDER_SITE_OTHER): Payer: Medicare Other | Admitting: Cardiology

## 2018-01-15 ENCOUNTER — Inpatient Hospital Stay (HOSPITAL_COMMUNITY)
Admission: EM | Admit: 2018-01-15 | Discharge: 2018-01-30 | DRG: 368 | Disposition: A | Discharge status: Other Acute Inpatient Hospital | Payer: Medicare Other | Attending: Internal Medicine | Admitting: Internal Medicine

## 2018-01-15 ENCOUNTER — Encounter: Payer: Self-pay | Admitting: Cardiology

## 2018-01-15 ENCOUNTER — Emergency Department (HOSPITAL_COMMUNITY): Payer: Medicare Other

## 2018-01-15 VITALS — BP 94/58 | HR 65 | Ht 72.0 in | Wt 275.4 lb

## 2018-01-15 DIAGNOSIS — R140 Abdominal distension (gaseous): Secondary | ICD-10-CM

## 2018-01-15 DIAGNOSIS — K683 Retroperitoneal hematoma: Secondary | ICD-10-CM | POA: Diagnosis not present

## 2018-01-15 DIAGNOSIS — K625 Hemorrhage of anus and rectum: Secondary | ICD-10-CM | POA: Diagnosis not present

## 2018-01-15 DIAGNOSIS — I38 Endocarditis, valve unspecified: Secondary | ICD-10-CM | POA: Diagnosis present

## 2018-01-15 DIAGNOSIS — D692 Other nonthrombocytopenic purpura: Secondary | ICD-10-CM | POA: Diagnosis present

## 2018-01-15 DIAGNOSIS — A419 Sepsis, unspecified organism: Secondary | ICD-10-CM | POA: Diagnosis not present

## 2018-01-15 DIAGNOSIS — Y92230 Patient room in hospital as the place of occurrence of the external cause: Secondary | ICD-10-CM | POA: Diagnosis not present

## 2018-01-15 DIAGNOSIS — I25119 Atherosclerotic heart disease of native coronary artery with unspecified angina pectoris: Secondary | ICD-10-CM | POA: Diagnosis present

## 2018-01-15 DIAGNOSIS — K648 Other hemorrhoids: Secondary | ICD-10-CM

## 2018-01-15 DIAGNOSIS — I469 Cardiac arrest, cause unspecified: Secondary | ICD-10-CM | POA: Diagnosis not present

## 2018-01-15 DIAGNOSIS — R197 Diarrhea, unspecified: Secondary | ICD-10-CM

## 2018-01-15 DIAGNOSIS — K210 Gastro-esophageal reflux disease with esophagitis: Secondary | ICD-10-CM | POA: Diagnosis present

## 2018-01-15 DIAGNOSIS — F05 Delirium due to known physiological condition: Secondary | ICD-10-CM | POA: Diagnosis present

## 2018-01-15 DIAGNOSIS — D500 Iron deficiency anemia secondary to blood loss (chronic): Secondary | ICD-10-CM

## 2018-01-15 DIAGNOSIS — G92 Toxic encephalopathy: Secondary | ICD-10-CM | POA: Diagnosis not present

## 2018-01-15 DIAGNOSIS — Z515 Encounter for palliative care: Secondary | ICD-10-CM | POA: Diagnosis not present

## 2018-01-15 DIAGNOSIS — T457X5A Adverse effect of anticoagulant antagonists, vitamin K and other coagulants, initial encounter: Secondary | ICD-10-CM | POA: Diagnosis not present

## 2018-01-15 DIAGNOSIS — G4733 Obstructive sleep apnea (adult) (pediatric): Secondary | ICD-10-CM | POA: Diagnosis not present

## 2018-01-15 DIAGNOSIS — Z9114 Patient's other noncompliance with medication regimen: Secondary | ICD-10-CM

## 2018-01-15 DIAGNOSIS — M310 Hypersensitivity angiitis: Secondary | ICD-10-CM | POA: Diagnosis present

## 2018-01-15 DIAGNOSIS — M1990 Unspecified osteoarthritis, unspecified site: Secondary | ICD-10-CM | POA: Diagnosis present

## 2018-01-15 DIAGNOSIS — R339 Retention of urine, unspecified: Secondary | ICD-10-CM | POA: Diagnosis not present

## 2018-01-15 DIAGNOSIS — Z885 Allergy status to narcotic agent status: Secondary | ICD-10-CM

## 2018-01-15 DIAGNOSIS — R0689 Other abnormalities of breathing: Secondary | ICD-10-CM

## 2018-01-15 DIAGNOSIS — F02818 Dementia in other diseases classified elsewhere, unspecified severity, with other behavioral disturbance: Secondary | ICD-10-CM | POA: Diagnosis present

## 2018-01-15 DIAGNOSIS — J690 Pneumonitis due to inhalation of food and vomit: Secondary | ICD-10-CM | POA: Diagnosis not present

## 2018-01-15 DIAGNOSIS — K226 Gastro-esophageal laceration-hemorrhage syndrome: Principal | ICD-10-CM | POA: Diagnosis present

## 2018-01-15 DIAGNOSIS — Z7901 Long term (current) use of anticoagulants: Secondary | ICD-10-CM

## 2018-01-15 DIAGNOSIS — D62 Acute posthemorrhagic anemia: Secondary | ICD-10-CM | POA: Diagnosis not present

## 2018-01-15 DIAGNOSIS — Z79899 Other long term (current) drug therapy: Secondary | ICD-10-CM

## 2018-01-15 DIAGNOSIS — R402144 Coma scale, eyes open, spontaneous, 24 hours or more after hospital admission: Secondary | ICD-10-CM | POA: Diagnosis not present

## 2018-01-15 DIAGNOSIS — Z794 Long term (current) use of insulin: Secondary | ICD-10-CM

## 2018-01-15 DIAGNOSIS — D689 Coagulation defect, unspecified: Secondary | ICD-10-CM | POA: Diagnosis present

## 2018-01-15 DIAGNOSIS — Z952 Presence of prosthetic heart valve: Secondary | ICD-10-CM

## 2018-01-15 DIAGNOSIS — J9601 Acute respiratory failure with hypoxia: Secondary | ICD-10-CM | POA: Diagnosis not present

## 2018-01-15 DIAGNOSIS — K661 Hemoperitoneum: Secondary | ICD-10-CM | POA: Diagnosis present

## 2018-01-15 DIAGNOSIS — N179 Acute kidney failure, unspecified: Secondary | ICD-10-CM | POA: Diagnosis present

## 2018-01-15 DIAGNOSIS — I252 Old myocardial infarction: Secondary | ICD-10-CM

## 2018-01-15 DIAGNOSIS — I951 Orthostatic hypotension: Secondary | ICD-10-CM

## 2018-01-15 DIAGNOSIS — I959 Hypotension, unspecified: Secondary | ICD-10-CM | POA: Diagnosis present

## 2018-01-15 DIAGNOSIS — E785 Hyperlipidemia, unspecified: Secondary | ICD-10-CM | POA: Diagnosis present

## 2018-01-15 DIAGNOSIS — R571 Hypovolemic shock: Secondary | ICD-10-CM | POA: Diagnosis not present

## 2018-01-15 DIAGNOSIS — F0391 Unspecified dementia with behavioral disturbance: Secondary | ICD-10-CM | POA: Diagnosis present

## 2018-01-15 DIAGNOSIS — E1151 Type 2 diabetes mellitus with diabetic peripheral angiopathy without gangrene: Secondary | ICD-10-CM | POA: Diagnosis present

## 2018-01-15 DIAGNOSIS — G4700 Insomnia, unspecified: Secondary | ICD-10-CM | POA: Diagnosis not present

## 2018-01-15 DIAGNOSIS — R4781 Slurred speech: Secondary | ICD-10-CM | POA: Diagnosis not present

## 2018-01-15 DIAGNOSIS — Z9089 Acquired absence of other organs: Secondary | ICD-10-CM

## 2018-01-15 DIAGNOSIS — Z978 Presence of other specified devices: Secondary | ICD-10-CM

## 2018-01-15 DIAGNOSIS — I5030 Unspecified diastolic (congestive) heart failure: Secondary | ICD-10-CM | POA: Diagnosis present

## 2018-01-15 DIAGNOSIS — N5089 Other specified disorders of the male genital organs: Secondary | ICD-10-CM | POA: Diagnosis present

## 2018-01-15 DIAGNOSIS — Z6837 Body mass index (BMI) 37.0-37.9, adult: Secondary | ICD-10-CM

## 2018-01-15 DIAGNOSIS — G40909 Epilepsy, unspecified, not intractable, without status epilepticus: Secondary | ICD-10-CM

## 2018-01-15 DIAGNOSIS — J449 Chronic obstructive pulmonary disease, unspecified: Secondary | ICD-10-CM | POA: Diagnosis present

## 2018-01-15 DIAGNOSIS — Z781 Physical restraint status: Secondary | ICD-10-CM

## 2018-01-15 DIAGNOSIS — I5032 Chronic diastolic (congestive) heart failure: Secondary | ICD-10-CM | POA: Diagnosis present

## 2018-01-15 DIAGNOSIS — Z9049 Acquired absence of other specified parts of digestive tract: Secondary | ICD-10-CM

## 2018-01-15 DIAGNOSIS — F0281 Dementia in other diseases classified elsewhere with behavioral disturbance: Secondary | ICD-10-CM | POA: Diagnosis present

## 2018-01-15 DIAGNOSIS — G9389 Other specified disorders of brain: Secondary | ICD-10-CM | POA: Diagnosis present

## 2018-01-15 DIAGNOSIS — E1165 Type 2 diabetes mellitus with hyperglycemia: Secondary | ICD-10-CM | POA: Diagnosis present

## 2018-01-15 DIAGNOSIS — Z538 Procedure and treatment not carried out for other reasons: Secondary | ICD-10-CM | POA: Diagnosis not present

## 2018-01-15 DIAGNOSIS — F17210 Nicotine dependence, cigarettes, uncomplicated: Secondary | ICD-10-CM | POA: Diagnosis present

## 2018-01-15 DIAGNOSIS — Z96652 Presence of left artificial knee joint: Secondary | ICD-10-CM | POA: Diagnosis present

## 2018-01-15 DIAGNOSIS — R402244 Coma scale, best verbal response, confused conversation, 24 hours or more after hospital admission: Secondary | ICD-10-CM | POA: Diagnosis not present

## 2018-01-15 DIAGNOSIS — I130 Hypertensive heart and chronic kidney disease with heart failure and stage 1 through stage 4 chronic kidney disease, or unspecified chronic kidney disease: Secondary | ICD-10-CM | POA: Diagnosis present

## 2018-01-15 DIAGNOSIS — E876 Hypokalemia: Secondary | ICD-10-CM | POA: Diagnosis not present

## 2018-01-15 DIAGNOSIS — D72829 Elevated white blood cell count, unspecified: Secondary | ICD-10-CM

## 2018-01-15 DIAGNOSIS — R51 Headache: Secondary | ICD-10-CM | POA: Diagnosis present

## 2018-01-15 DIAGNOSIS — R441 Visual hallucinations: Secondary | ICD-10-CM | POA: Diagnosis not present

## 2018-01-15 DIAGNOSIS — N183 Chronic kidney disease, stage 3 (moderate): Secondary | ICD-10-CM | POA: Diagnosis present

## 2018-01-15 DIAGNOSIS — L304 Erythema intertrigo: Secondary | ICD-10-CM | POA: Diagnosis not present

## 2018-01-15 DIAGNOSIS — K2270 Barrett's esophagus without dysplasia: Secondary | ICD-10-CM | POA: Diagnosis present

## 2018-01-15 DIAGNOSIS — K6289 Other specified diseases of anus and rectum: Secondary | ICD-10-CM | POA: Diagnosis present

## 2018-01-15 DIAGNOSIS — K2970 Gastritis, unspecified, without bleeding: Secondary | ICD-10-CM | POA: Diagnosis present

## 2018-01-15 DIAGNOSIS — R6521 Severe sepsis with septic shock: Secondary | ICD-10-CM | POA: Diagnosis not present

## 2018-01-15 DIAGNOSIS — E1122 Type 2 diabetes mellitus with diabetic chronic kidney disease: Secondary | ICD-10-CM | POA: Diagnosis present

## 2018-01-15 DIAGNOSIS — N4889 Other specified disorders of penis: Secondary | ICD-10-CM | POA: Diagnosis present

## 2018-01-15 DIAGNOSIS — Z66 Do not resuscitate: Secondary | ICD-10-CM | POA: Diagnosis not present

## 2018-01-15 DIAGNOSIS — R440 Auditory hallucinations: Secondary | ICD-10-CM | POA: Diagnosis not present

## 2018-01-15 DIAGNOSIS — R402364 Coma scale, best motor response, obeys commands, 24 hours or more after hospital admission: Secondary | ICD-10-CM | POA: Diagnosis not present

## 2018-01-15 DIAGNOSIS — B182 Chronic viral hepatitis C: Secondary | ICD-10-CM | POA: Diagnosis present

## 2018-01-15 DIAGNOSIS — G9341 Metabolic encephalopathy: Secondary | ICD-10-CM | POA: Clinically undetermined

## 2018-01-15 DIAGNOSIS — K7460 Unspecified cirrhosis of liver: Secondary | ICD-10-CM

## 2018-01-15 DIAGNOSIS — Z7982 Long term (current) use of aspirin: Secondary | ICD-10-CM

## 2018-01-15 LAB — BMP8+ANION GAP
Anion Gap: 14 mmol/L (ref 10.0–18.0)
BUN/Creatinine Ratio: 24 (ref 10–24)
BUN: 36 mg/dL — ABNORMAL HIGH (ref 8–27)
CO2: 22 mmol/L (ref 20–29)
Calcium: 8.7 mg/dL (ref 8.6–10.2)
Chloride: 100 mmol/L (ref 96–106)
Creatinine, Ser: 1.51 mg/dL — ABNORMAL HIGH (ref 0.76–1.27)
GFR calc Af Amer: 54 mL/min/{1.73_m2} — ABNORMAL LOW (ref >59)
GFR calc non Af Amer: 47 mL/min/{1.73_m2} — ABNORMAL LOW (ref >59)
Glucose: 229 mg/dL — ABNORMAL HIGH (ref 65–99)
Potassium: 4.8 mmol/L (ref 3.5–5.2)
Sodium: 136 mmol/L (ref 134–144)

## 2018-01-15 LAB — CBC
HCT: 26 % — ABNORMAL LOW (ref 39.0–52.0)
HCT: 28.5 % — ABNORMAL LOW (ref 39.0–52.0)
Hemoglobin: 8.1 g/dL — ABNORMAL LOW (ref 13.0–17.0)
Hemoglobin: 9.2 g/dL — ABNORMAL LOW (ref 13.0–17.0)
MCH: 25.4 pg — ABNORMAL LOW (ref 26.0–34.0)
MCH: 26.1 pg (ref 26.0–34.0)
MCHC: 31.2 g/dL (ref 30.0–36.0)
MCHC: 32.3 g/dL (ref 30.0–36.0)
MCV: 81.5 fL (ref 78.0–100.0)
MCV: 81 fL (ref 78.0–100.0)
Platelets: 163 10*3/uL (ref 150–400)
Platelets: 179 10*3/uL (ref 150–400)
RBC: 3.19 MIL/uL — ABNORMAL LOW (ref 4.22–5.81)
RBC: 3.52 MIL/uL — ABNORMAL LOW (ref 4.22–5.81)
RDW: 14.6 % (ref 11.5–15.5)
RDW: 14.4 % (ref 11.5–15.5)
WBC: 7.3 10*3/uL (ref 4.0–10.5)
WBC: 9.1 10*3/uL (ref 4.0–10.5)

## 2018-01-15 LAB — LIVER FIBROSIS, FIBROTEST-ACTITEST
ALT: 31 U/L (ref 9–46)
Alpha-2-Macroglobulin: 250 mg/dL (ref 106–279)
Apolipoprotein A1: 137 mg/dL (ref 94–176)
Bilirubin: 0.5 mg/dL (ref 0.2–1.2)
Fibrosis Score: 0.74
GGT: 143 U/L — ABNORMAL HIGH (ref 3–70)
Haptoglobin: 51 mg/dL (ref 43–212)
Necroinflammat ACT Score: 0.26
Reference ID: 2661842

## 2018-01-15 LAB — COMPREHENSIVE METABOLIC PANEL
ALT: 30 U/L (ref 0–44)
AST: 41 U/L (ref 15–41)
Albumin: 2.5 g/dL — ABNORMAL LOW (ref 3.5–5.0)
Alkaline Phosphatase: 129 U/L — ABNORMAL HIGH (ref 38–126)
Anion gap: 6 (ref 5–15)
BUN: 37 mg/dL — ABNORMAL HIGH (ref 8–23)
CO2: 22 mmol/L (ref 22–32)
Calcium: 8.5 mg/dL — ABNORMAL LOW (ref 8.9–10.3)
Chloride: 105 mmol/L (ref 98–111)
Creatinine, Ser: 1.76 mg/dL — ABNORMAL HIGH (ref 0.61–1.24)
GFR calc Af Amer: 44 mL/min — ABNORMAL LOW (ref >60)
GFR calc non Af Amer: 38 mL/min — ABNORMAL LOW (ref >60)
Glucose, Bld: 240 mg/dL — ABNORMAL HIGH (ref 70–99)
Potassium: 4.5 mmol/L (ref 3.5–5.1)
Sodium: 133 mmol/L — ABNORMAL LOW (ref 135–145)
Total Bilirubin: 0.6 mg/dL (ref 0.3–1.2)
Total Protein: 6.3 g/dL — ABNORMAL LOW (ref 6.5–8.1)

## 2018-01-15 LAB — LACTIC ACID, PLASMA: Lactic Acid, Venous: 1.1 mmol/L (ref 0.5–1.9)

## 2018-01-15 LAB — I-STAT CG4 LACTIC ACID, ED: Lactic Acid, Venous: 2.01 mmol/L (ref 0.5–1.9)

## 2018-01-15 LAB — PREPARE RBC (CROSSMATCH)

## 2018-01-15 LAB — HEPATITIS B CORE ANTIBODY, TOTAL: Hep B Core Total Ab: NONREACTIVE

## 2018-01-15 LAB — PROTIME-INR
INR: 2.76
Prothrombin Time: 28.9 seconds — ABNORMAL HIGH (ref 11.4–15.2)

## 2018-01-15 LAB — GLUCOSE, CAPILLARY: Glucose-Capillary: 119 mg/dL — ABNORMAL HIGH (ref 70–99)

## 2018-01-15 LAB — ABO/RH: ABO/RH(D): A POS

## 2018-01-15 LAB — POC OCCULT BLOOD, ED: Fecal Occult Bld: POSITIVE — AB

## 2018-01-15 LAB — HEPATITIS C GENOTYPE

## 2018-01-15 LAB — HEPATITIS B SURFACE ANTIGEN: Hepatitis B Surface Ag: NONREACTIVE

## 2018-01-15 LAB — HEPATITIS A ANTIBODY, TOTAL: Hepatitis A AB,Total: REACTIVE — AB

## 2018-01-15 LAB — HEPATITIS B SURFACE ANTIBODY,QUALITATIVE: Hep B S Ab: NONREACTIVE

## 2018-01-15 MED ORDER — INSULIN GLARGINE 100 UNIT/ML ~~LOC~~ SOLN
12.0000 [IU] | Freq: Every day | SUBCUTANEOUS | Status: DC
  Administered 2018-01-15 – 2018-01-16 (×2): 12 [IU] via SUBCUTANEOUS
  Filled 2018-01-15 (×2): qty 0.12

## 2018-01-15 MED ORDER — FUROSEMIDE 20 MG PO TABS: 60.0000 mg | ORAL_TABLET | Freq: Every day | ORAL | Status: DC

## 2018-01-15 MED ORDER — INSULIN ASPART 100 UNIT/ML ~~LOC~~ SOLN
0.0000 [IU] | Freq: Three times a day (TID) | SUBCUTANEOUS | Status: DC
  Administered 2018-01-16: 3 [IU] via SUBCUTANEOUS
  Administered 2018-01-16: 5 [IU] via SUBCUTANEOUS
  Administered 2018-01-16 – 2018-01-18 (×5): 3 [IU] via SUBCUTANEOUS
  Administered 2018-01-18 (×2): 2 [IU] via SUBCUTANEOUS
  Administered 2018-01-19: 3 [IU] via SUBCUTANEOUS
  Administered 2018-01-19 – 2018-01-20 (×4): 2 [IU] via SUBCUTANEOUS
  Administered 2018-01-21: 3 [IU] via SUBCUTANEOUS
  Administered 2018-01-21: 2 [IU] via SUBCUTANEOUS
  Administered 2018-01-22: 5 [IU] via SUBCUTANEOUS

## 2018-01-15 MED ORDER — INSULIN ASPART 100 UNIT/ML ~~LOC~~ SOLN
0.0000 [IU] | Freq: Every day | SUBCUTANEOUS | Status: DC
  Administered 2018-01-15: 2 [IU] via SUBCUTANEOUS
  Administered 2018-01-16: 3 [IU] via SUBCUTANEOUS

## 2018-01-15 MED ORDER — LEVETIRACETAM 500 MG PO TABS
1000.0000 mg | ORAL_TABLET | Freq: Two times a day (BID) | ORAL | Status: DC
  Administered 2018-01-15 – 2018-01-22 (×14): 1000 mg via ORAL
  Filled 2018-01-15 (×16): qty 2

## 2018-01-15 MED ORDER — IOHEXOL 300 MG/ML  SOLN
100.0000 mL | Freq: Once | INTRAMUSCULAR | Status: AC | PRN
  Administered 2018-01-15: 100 mL via INTRAVENOUS

## 2018-01-15 MED ORDER — OXYCODONE HCL 5 MG PO TABS
5.0000 mg | ORAL_TABLET | ORAL | Status: DC | PRN
  Administered 2018-01-15 – 2018-01-16 (×4): 5 mg via ORAL
  Filled 2018-01-15 (×4): qty 1

## 2018-01-15 MED ORDER — TAMSULOSIN HCL 0.4 MG PO CAPS
0.4000 mg | ORAL_CAPSULE | Freq: Every day | ORAL | Status: DC
  Administered 2018-01-15 – 2018-01-21 (×6): 0.4 mg via ORAL
  Filled 2018-01-15 (×8): qty 1

## 2018-01-15 MED ORDER — CARVEDILOL 6.25 MG PO TABS
6.2500 mg | ORAL_TABLET | Freq: Two times a day (BID) | ORAL | Status: DC
  Administered 2018-01-16 – 2018-01-22 (×9): 6.25 mg via ORAL
  Filled 2018-01-15 (×11): qty 1

## 2018-01-15 MED ORDER — SODIUM CHLORIDE 0.9% IV SOLUTION: Freq: Once | INTRAVENOUS | Status: DC

## 2018-01-15 MED ORDER — FENTANYL CITRATE (PF) 100 MCG/2ML IJ SOLN
25.0000 ug | Freq: Once | INTRAMUSCULAR | Status: AC
  Administered 2018-01-15: 25 ug via INTRAVENOUS
  Filled 2018-01-15: qty 2

## 2018-01-15 MED ORDER — ATORVASTATIN CALCIUM 10 MG PO TABS
10.0000 mg | ORAL_TABLET | Freq: Every day | ORAL | Status: DC
  Administered 2018-01-16 – 2018-01-30 (×14): 10 mg via ORAL
  Filled 2018-01-15 (×14): qty 1

## 2018-01-15 MED ORDER — SENNOSIDES-DOCUSATE SODIUM 8.6-50 MG PO TABS
1.0000 | ORAL_TABLET | Freq: Two times a day (BID) | ORAL | Status: DC
  Administered 2018-01-15 – 2018-01-30 (×19): 1 via ORAL
  Filled 2018-01-15 (×23): qty 1

## 2018-01-15 MED ORDER — CLOTRIMAZOLE 1 % EX CREA
1.0000 "application " | TOPICAL_CREAM | Freq: Two times a day (BID) | CUTANEOUS | Status: DC
  Administered 2018-01-15 – 2018-01-30 (×24): 1 via TOPICAL
  Filled 2018-01-15 (×5): qty 15

## 2018-01-15 MED ORDER — HYDROCORTISONE ACETATE 25 MG RE SUPP
25.0000 mg | Freq: Two times a day (BID) | RECTAL | Status: DC | PRN
  Filled 2018-01-15: qty 1

## 2018-01-15 NOTE — Progress Notes (Signed)
CSW spoke with pt's daughter in lobby of ED. CSW was informed that pt was just discharged from [facility] as of Monday. CSW was informed by daughter that she is no longer able to care for pt as needs have become more. Daughter informed CSW that once pt was discharge from [facility] on Monday pt was to go to [facility]. However barriers arise and pt was unable to be admitted to this facility until further needs have been addressed. CSW spoke with [facility] regarding pt returning and paying private as daughter expressed that she is willing to pay out of pocket for placement at this time. Santiago Glad from [facility] to call CSW back with updated information. CSW advised daughter that depending upon what pt's needs are CSW will assist with further placement needs at best.    Virgie Dad. Avneet Ashmore, MSW, [place] [facility] Department Clinical Social Worker [phone]

## 2018-01-15 NOTE — ED Notes (Signed)
Attempted report x1. 

## 2018-01-15 NOTE — Patient Instructions (Signed)
Medication Instructions:  Your physician recommends that you continue on your current medications as directed. Please refer to the Current Medication list given to you today.  Follow-Up: You have been referred to Dr. Redmond Baseman at ENT for [facility] Device  If you need a refill on your cardiac medications before your next appointment, please call your pharmacy.

## 2018-01-15 NOTE — ED Provider Notes (Signed)
[place] EMERGENCY DEPARTMENT Provider Note   CSN: [id number] Arrival date & time: 01/15/18  1020     History   Chief Complaint Chief Complaint  Patient presents with  . Rectal Bleeding    HPI Adam Savage is a 67 y.o. male.  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  He has no improvement with home baths or creams.  Seen by PCP for the same yesterday.  He denies fevers, CP, SOB, abdominal pain, vomiting, and urinary complaints.  Per chart review patient seen by PCP yesterday and diagnosed with intertrigo and incontinence 2/2 internal hemorrhoids.  He was given suppositories and clotrimazole cream with follow-up.   Past Medical History:  Diagnosis Date  . COPD (chronic obstructive pulmonary disease) ([place])   . Diabetes mellitus without complication ([place])   . Mechanical heart valve present   . Pre-diabetes   . Seizures [facility])     Patient Active Problem List   Diagnosis Date Noted  . Perianal infection 01/15/2018  . Diarrhea 01/15/2018  . Hypotension 01/15/2018  . Internal hemorrhoids 01/14/2018  . Intertrigo 01/14/2018  . Chronic hepatitis C without hepatic coma ([place]) 01/12/2018  . Diastolic CHF due to valvular disease ([place]) 12/23/2017  . Leg swelling 12/22/2017  . Encounter for therapeutic drug monitoring 11/06/2017  . Non-ST elevation (NSTEMI) myocardial infarction ([place])   . Anticoagulant long-term use   . Unstable angina ([place]) 10/29/2017  . Chest pain 10/28/2017  . Seizures ([place])   . COPD (chronic obstructive pulmonary disease) ([place])   . Pre-diabetes   . Overdose of muscle relaxant 08/11/2017  . Folic acid deficiency anemia 06/20/2016  . Obesity 09/21/2015  . Fatty liver disease, nonalcoholic 19/75/8832  . Elevated liver enzymes 07/28/2015  . Osteoarthritis 07/28/2015    . OSA (obstructive sleep apnea) 05/05/2015  . Joint pain 11/12/2013  . Tension headache, chronic 08/25/2013  . Status post lobectomy of brain 06/28/2013  . Epilepsy ([place]) 11/03/2012  . H/O diverticulitis of colon 11/03/2012  . Mechanical heart valve present 09/05/2012    Past Surgical History:  Procedure Laterality Date  . CARDIAC SURGERY    . CHOLECYSTECTOMY    . COLON SURGERY    . CORONARY/GRAFT ANGIOGRAPHY N/A 10/31/2017   Procedure: CORONARY/GRAFT ANGIOGRAPHY;  Surgeon: Nelva Bush, MD;  Location: [place] [facility];  Service: Cardiovascular;  Laterality: N/A;  . JOINT REPLACEMENT    . KNEE SURGERY Left         Home Medications    Prior to Admission medications   Medication Sig Start Date End Date Taking? Authorizing Provider  acetaminophen (TYLENOL) 650 MG CR tablet Take 1,300 mg by mouth every 8 (eight) hours as needed for pain.   Yes [provider]  albuterol (PROVENTIL HFA;VENTOLIN HFA) 108 (90 Base) MCG/ACT inhaler Inhale 2 puffs into the lungs every 6 (six) hours as needed for wheezing or shortness of breath. 12/25/17  Yes [name], MD  aspirin EC 81 MG tablet Take 81 mg by mouth daily.   Yes [provider]  atorvastatin (LIPITOR) 10 MG tablet Take 1 tablet (10 mg total) by mouth daily. 11/04/17 01/20/18 Yes Arrien, Jimmy Picket, MD  carvedilol (COREG) 6.25 MG tablet Take 1 tablet (6.25 mg total) by mouth 2 (two) times daily with a meal. 11/27/17  Yes Larae Grooms S,  MD  clotrimazole (LOTRIMIN) 1 % cream Apply 1 application topically 2 (two) times daily. For 2 weeks 01/14/18  Yes Guilloud, Carolyn, MD  furosemide (LASIX) 20 MG tablet Take 3 tablets (60 mg total) by mouth daily. 12/25/17 12/25/18 Yes Lee, Joshua K, MD  hydrocortisone (ANUSOL-HC) 25 MG suppository Place 1 suppository (25 mg total) rectally 2 (two) times daily as needed for hemorrhoids or anal itching. 01/14/18 01/14/19 Yes Guilloud, Carolyn, MD  insulin detemir (LEVEMIR) 100  UNIT/ML injection Inject 12 Units into the skin daily.   Yes [provider]  levETIRAcetam (KEPPRA) 1000 MG tablet Take 1 tablet (1,000 mg total) by mouth 2 (two) times daily. 11/14/17  Yes Aquino, Karen M, MD  lisinopril (PRINIVIL,ZESTRIL) 10 MG tablet Take 10 mg by mouth daily.   Yes [provider]  mirtazapine (REMERON) 30 MG tablet Take 30 mg by mouth at bedtime. 12/07/17  Yes [provider]  ramipril (ALTACE) 5 MG capsule Take 1 capsule (5 mg total) by mouth daily. 11/27/17  Yes Varanasi, Jayadeep S, MD  ranitidine (ZANTAC) 150 MG tablet Take 150 mg by mouth 2 (two) times daily.  05/01/17  Yes [provider]  tamsulosin (FLOMAX) 0.4 MG CAPS capsule Take 0.4 mg by mouth at bedtime.  05/01/17  Yes [provider]  traZODone (DESYREL) 100 MG tablet Take 200 mg by mouth at bedtime. 10/15/17  Yes [provider]  warfarin (COUMADIN) 5 MG tablet Take 5 mg by mouth See admin instructions. Take one tablet (5 mg) by mouth Monday, Tuesday, Wednesday, Friday, Saturday nights - 9pm 05/02/17  Yes [provider]  warfarin (COUMADIN) 7.5 MG tablet Take 7.5 mg by mouth See admin instructions. Take one tablet (7.5 mg) by mouth on Sunday and Thursday nights - 9pm (take 5 mg tablet on all other days of the week) 05/01/17  Yes [provider]  blood glucose meter kit and supplies Dispense based on patient and insurance preference. Use up to four times daily as directed. (FOR ICD-10 E10.9, E11.9). 11/04/17   Arrien, Mauricio Daniel, MD  glucose blood test strip Use as instructed 01/13/18   Lee, Joshua K, MD  Lancets MISC 1 Units by Does not apply route 4 (four) times daily -  with meals and at bedtime. 01/13/18   Lee, Joshua K, MD    Family History Family History  Problem Relation Age of Onset  . Hypertension Father     Social History Social History   Tobacco Use  . Smoking status: Current Every Day Smoker    Packs/day: 1.00    Types:  Cigarettes  . Smokeless tobacco: Never Used  Substance Use Topics  . Alcohol use: Not Currently  . Drug use: Never     Allergies   Morphine and related   Review of Systems Review of Systems  Constitutional: Negative for chills and fever.  HENT: Negative for rhinorrhea and sore throat.   Eyes: Negative for pain and visual disturbance.  Respiratory: Negative for cough and shortness of breath.   Cardiovascular: Negative for chest pain and palpitations.  Gastrointestinal: Positive for anal bleeding, blood in stool, diarrhea and rectal pain. Negative for abdominal pain, nausea and vomiting.  Genitourinary: Negative for dysuria and hematuria.  Musculoskeletal: Negative for arthralgias and back pain.  Skin: Negative for color change and rash.  Neurological: Negative for seizures and syncope.  All other systems reviewed and are negative.    Physical Exam Updated Vital Signs BP (!) 91/49 (BP Location: Right   Arm)   Pulse 66   Temp (!) 97.3 F (36.3 C) (Oral)   Resp 16   Ht 6' (1.829 m)   Wt 125.6 kg   SpO2 100%   BMI 37.57 kg/m   Physical Exam  Constitutional: He is oriented to person, place, and time. He appears well-developed and well-nourished.  Elderly appearing Caucasian male.  HENT:  Head: Normocephalic and atraumatic.  Mouth/Throat: Oropharynx is clear and moist.  Eyes: Pupils are equal, round, and reactive to light. Conjunctivae are normal.  Neck: Normal range of motion. Neck supple.  Cardiovascular: Normal rate, regular rhythm and intact distal pulses.  No murmur heard. Pulmonary/Chest: Effort normal and breath sounds normal. No respiratory distress.  Abdominal: Soft. He exhibits no distension. There is no tenderness. There is no guarding.  Genitourinary:  Genitourinary Comments: Rectal exam performed with chaperone.  Significant erythema and maceration surrounding rectum and perianal region.  No visible external hemorrhoids or fissures.  Palpated internal  hemorrhoid. Significant pain with DRE.  Guaiac positive. No gross blood visualized.   Musculoskeletal: He exhibits no edema.  Neurological: He is alert and oriented to person, place, and time.  Skin: Skin is warm and dry. Capillary refill takes less than 2 seconds.  Psychiatric: He has a normal mood and affect.  Nursing note and vitals reviewed.    ED Treatments / Results  Labs (all labs ordered are listed, but only abnormal results are displayed) Labs Reviewed  COMPREHENSIVE METABOLIC PANEL - Abnormal; Notable for the following components:      Result Value   Sodium 133 (*)    Glucose, Bld 240 (*)    BUN 37 (*)    Creatinine, Ser 1.76 (*)    Calcium 8.5 (*)    Total Protein 6.3 (*)    Albumin 2.5 (*)    Alkaline Phosphatase 129 (*)    GFR calc non Af Amer 38 (*)    GFR calc Af Amer 44 (*)    All other components within normal limits  CBC - Abnormal; Notable for the following components:   RBC 3.19 (*)    Hemoglobin 8.1 (*)    HCT 26.0 (*)    MCH 25.4 (*)    All other components within normal limits  PROTIME-INR - Abnormal; Notable for the following components:   Prothrombin Time 28.9 (*)    All other components within normal limits  COMPREHENSIVE METABOLIC PANEL - Abnormal; Notable for the following components:   Sodium 133 (*)    Glucose, Bld 207 (*)    BUN 36 (*)    Creatinine, Ser 1.70 (*)    Calcium 8.5 (*)    Total Protein 6.4 (*)    Albumin 2.7 (*)    AST 45 (*)    Alkaline Phosphatase 139 (*)    GFR calc non Af Amer 40 (*)    GFR calc Af Amer 46 (*)    All other components within normal limits  CBC - Abnormal; Notable for the following components:   RBC 3.52 (*)    Hemoglobin 9.2 (*)    HCT 28.5 (*)    All other components within normal limits  CBC - Abnormal; Notable for the following components:   RBC 3.57 (*)    Hemoglobin 9.3 (*)    HCT 28.6 (*)    All other components within normal limits  GLUCOSE, CAPILLARY - Abnormal; Notable for the following  components:   Glucose-Capillary 119 (*)    All other components within   normal limits  PROTIME-INR - Abnormal; Notable for the following components:   Prothrombin Time 25.0 (*)    All other components within normal limits  GLUCOSE, CAPILLARY - Abnormal; Notable for the following components:   Glucose-Capillary 234 (*)    All other components within normal limits  GLUCOSE, CAPILLARY - Abnormal; Notable for the following components:   Glucose-Capillary 217 (*)    All other components within normal limits  POC OCCULT BLOOD, ED - Abnormal; Notable for the following components:   Fecal Occult Bld POSITIVE (*)    All other components within normal limits  I-STAT CG4 LACTIC ACID, ED - Abnormal; Notable for the following components:   Lactic Acid, Venous 2.01 (*)    All other components within normal limits  RAPID URINE DRUG SCREEN, HOSP PERFORMED  LACTIC ACID, PLASMA  CBC  POC OCCULT BLOOD, ED  TYPE AND SCREEN  PREPARE RBC (CROSSMATCH)  ABO/RH    EKG None  Radiology Ct Pelvis W Contrast  Result Date: 01/15/2018 CLINICAL DATA:  Diarrhea and rectal bleeding. Internal hemorrhoids. Evaluate for fistula. EXAM: CT PELVIS WITH CONTRAST TECHNIQUE: Multidetector CT imaging of the pelvis was performed using the standard protocol following the bolus administration of intravenous contrast. CONTRAST:  155m OMNIPAQUE IOHEXOL 300 MG/ML  SOLN COMPARISON:  None. FINDINGS: Urinary Tract: Mild lobulation of the urinary bladder. No wall thickening or masslike finding Bowel: Colon resection with anastomosis at the upper rectum. No inflammation or detected wall thickening. Visualized small bowel is negative. Vascular/Lymphatic: Atherosclerotic calcification Reproductive: Small prostate with calcifications. No acute finding. Other:  No ascites or pneumoperitoneum in the imaged abdomen. Musculoskeletal: No acute or aggressive finding. 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 IMPRESSION: No acute finding. No evidence of perianal collection or inflammation. Electronically Signed   By: JMonte FantasiaM.D.   On: 01/15/2018 14:58    Procedures Procedures (including critical care time)  Medications Ordered in ED Medications  0.9 %  sodium chloride infusion (Manually program via Guardrails IV Fluids) (has no administration in time range)  atorvastatin (LIPITOR) tablet 10 mg (10 mg Oral Given 01/16/18 1036)  carvedilol (COREG) tablet 6.25 mg (6.25 mg Oral Given 01/16/18 0819)  furosemide (LASIX) tablet 60 mg (0 mg Oral Hold 01/16/18 1037)  tamsulosin (FLOMAX) capsule 0.4 mg (0.4 mg Oral Given 01/15/18 2254)  levETIRAcetam (KEPPRA) tablet 1,000 mg (1,000 mg Oral Given 01/16/18 1036)  clotrimazole (LOTRIMIN) 1 % cream 1 application (1 application Topical Given 01/16/18 1037)  hydrocortisone (ANUSOL-HC) suppository 25 mg (has no administration in time range)  oxyCODONE (Oxy IR/ROXICODONE) immediate release tablet 5 mg (5 mg Oral Given 01/16/18 1032)  senna-docusate (Senokot-S) tablet 1 tablet (1 tablet Oral Given 01/16/18 1036)  insulin glargine (LANTUS) injection 12 Units (12 Units Subcutaneous Given 01/15/18 2254)  insulin aspart (novoLOG) injection 0-15 Units (5 Units Subcutaneous Given 01/16/18 0930)  insulin aspart (novoLOG) injection 0-5 Units (2 Units Subcutaneous Given 01/15/18 2311)  nicotine (NICODERM CQ - dosed in mg/24 hr) patch 7 mg (7 mg Transdermal Patch Applied 01/16/18 1035)  Warfarin - Pharmacist Dosing Inpatient (has no administration in time range)  warfarin (COUMADIN) tablet 5 mg (has no administration in time range)  fentaNYL (SUBLIMAZE) injection 25 mcg (25 mcg Intravenous Given 01/15/18 1156)  iohexol (OMNIPAQUE) 300 MG/ML solution 100 mL (100 mLs Intravenous Contrast Given 01/15/18 1436)     Initial Impression / Assessment and Plan / ED Course  I have reviewed the triage vital signs and the nursing  notes.  Pertinent labs & imaging results that were available during my care of the patient were reviewed by me and considered in my medical decision making (see chart for details).     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  Seen by PCP yesterday for the same and dx with intertrigo and incontinence.  On arrival patient with soft BP as systolic 24O otherwise VS WNL.  Exam as above with significant rectal erythema and maceration.  Internal hemorrhoid appreciated.  No gross blood.  CT pelvis with IV contrast obtained given istat lactic acidosis of 2 and hypotension in setting of lower GI bleed.  Concern for perianal abscess vs fistula.    CBC with Hgb 8.1 (9.7 on 12/24/17). No leukocytosis.  Patient consented for 1U PRBCs in setting of low Hgb and hypotension.  INR 2.76.  Remaining labs consistent with baseline.   CT without acute findings.  No evidence of perianal collection or inflammatory process.  No indication for Abx at this time.  Will admit to internal medicine teaching service for further monitoring.  Case discussed with resident.  HDS at transfer.  The plan for this patient was discussed with Dr. Tamera Punt who voiced agreement and who oversaw evaluation and treatment of this patient.  Final Clinical Impressions(s) / ED Diagnoses   Final diagnoses:  Internal hemorrhoid, bleeding  Blood loss anemia  Rectal bleeding  Intertrigo    ED Discharge Orders    None       Fabian November, MD 01/16/18 1233    Malvin Johns, MD 01/16/18 1501
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
## 2018-01-15 NOTE — Progress Notes (Signed)
Cardiology Office Note:    Date:  01/15/2018   ID:  Adam Savage, DOB 05/06/1950, MRN [id number]  PCP:  Patient, [name]  Cardiologist:  No primary care provider on file.    Referring MD: No ref. provider found   Chief Complaint  Patient presents with  . Sleep Apnea    History of Present Illness:    Adam Savage is a 67 y.o. male with a hx of DM, COPD and OSA on CPAP.  He was recently seen by Dr. Irish Lack and is now referred to establish with a sleep MD.  When Dr. Irish Lack saw him in July he had not been using his CPAP device in some time.  Apparently his wife had to go to stay with her aging parents and when the daughter saw her father he was found to not be able to care for himself, was noncompliant with meds and had not been using his CPAP device.  He is now referred to me to get back on CPAP therapy as his daughter stated that he was not sleeping well.   He is here with his daughter today.  He appears very disheveled.  He tells me that he does not like anything about his CPAP.  He says that he has tried every mask and he does not like any of them.  His last mask was a nasal pillow mask which he says blowed cold air up his nose.  His daughter states that he has a jug of distilled H2O by his bedside but refuses to put it in the device.  His daughter says that he is up most of the night pacing and becomes very confused. The patient tells me that he has no intention of using CPAP.    Past Medical History:  Diagnosis Date  . COPD (chronic obstructive pulmonary disease) ([place])   . Diabetes mellitus without complication ([place])   . Mechanical heart valve present   . Pre-diabetes   . Seizures ([place])     Past Surgical History:  Procedure Laterality Date  . CARDIAC SURGERY    . CHOLECYSTECTOMY    . COLON SURGERY    . CORONARY/GRAFT ANGIOGRAPHY N/A 10/31/2017   Procedure: CORONARY/GRAFT ANGIOGRAPHY;  Surgeon: Nelva Bush, MD;  Location: [facility];  Service: Cardiovascular;   Laterality: N/A;  . JOINT REPLACEMENT    . KNEE SURGERY Left     Current Medications: Current Meds  Medication Sig  . acetaminophen (TYLENOL) 650 MG CR tablet Take 1,300 mg by mouth every 8 (eight) hours as needed for pain.  Marland Kitchen albuterol (PROVENTIL HFA;VENTOLIN HFA) 108 (90 Base) MCG/ACT inhaler Inhale 2 puffs into the lungs every 6 (six) hours as needed for wheezing or shortness of breath.  Marland Kitchen aspirin EC 81 MG tablet Take 81 mg by mouth daily.  Marland Kitchen atorvastatin (LIPITOR) 10 MG tablet Take 1 tablet (10 mg total) by mouth daily.  . blood glucose meter kit and supplies Dispense based on patient and insurance preference. Use up to four times daily as directed. (FOR ICD-10 E10.9, E11.9).  . carvedilol (COREG) 6.25 MG tablet Take 1 tablet (6.25 mg total) by mouth 2 (two) times daily with a meal.  . clotrimazole (LOTRIMIN) 1 % cream Apply 1 application topically 2 (two) times daily. For 2 weeks  . furosemide (LASIX) 20 MG tablet Take 3 tablets (60 mg total) by mouth daily.  Marland Kitchen glucose blood test strip Use as instructed  . hydrocortisone (ANUSOL-HC) 25 MG suppository Place 1  suppository (25 mg total) rectally 2 (two) times daily as needed for hemorrhoids or anal itching.  . insulin detemir (LEVEMIR) 100 UNIT/ML injection Inject 12 Units into the skin daily.  . Lancets MISC 1 Units by Does not apply route 4 (four) times daily -  with meals and at bedtime.  . levETIRAcetam (KEPPRA) 1000 MG tablet Take 1 tablet (1,000 mg total) by mouth 2 (two) times daily.  Marland Kitchen lisinopril (PRINIVIL,ZESTRIL) 10 MG tablet Take 10 mg by mouth daily.  . mirtazapine (REMERON) 30 MG tablet Take 30 mg by mouth at bedtime.  . ramipril (ALTACE) 5 MG capsule Take 1 capsule (5 mg total) by mouth daily.  . ranitidine (ZANTAC) 150 MG tablet Take 150 mg by mouth 2 (two) times daily.   . tamsulosin (FLOMAX) 0.4 MG CAPS capsule Take 0.4 mg by mouth at bedtime.   . traZODone (DESYREL) 100 MG tablet Take 200 mg by mouth at bedtime.  Marland Kitchen  warfarin (COUMADIN) 5 MG tablet Take 5 mg by mouth See admin instructions. Take one tablet (5 mg) by mouth Monday, Tuesday, Wednesday, Friday, Saturday nights - 9pm  . warfarin (COUMADIN) 7.5 MG tablet Take 7.5 mg by mouth See admin instructions. Take one tablet (7.5 mg) by mouth on Sunday and Thursday nights - 9pm (take 5 mg tablet on all other days of the week)     Allergies:   Morphine and related   Social History   Socioeconomic History  . Marital status: Married    Spouse name: Not on file  . Number of children: Not on file  . Years of education: Not on file  . Highest education level: Not on file  Occupational History  . Not on file  Social Needs  . Financial resource strain: Not on file  . Food insecurity:    Worry: Not on file    Inability: Not on file  . Transportation needs:    Medical: Not on file    Non-medical: Not on file  Tobacco Use  . Smoking status: Current Every Day Smoker    Packs/day: 1.00    Types: Cigarettes  . Smokeless tobacco: Never Used  Substance and Sexual Activity  . Alcohol use: Not Currently  . Drug use: Never  . Sexual activity: Not on file  Lifestyle  . Physical activity:    Days per week: Not on file    Minutes per session: Not on file  . Stress: Not on file  Relationships  . Social connections:    Talks on phone: Not on file    Gets together: Not on file    Attends religious service: Not on file    Active member of club or organization: Not on file    Attends meetings of clubs or organizations: Not on file    Relationship status: Not on file  Other Topics Concern  . Not on file  Social History Narrative  . Not on file     Family History: The patient's family history includes Hypertension in his father.  ROS:   Please see the history of present illness.    ROS  All other systems reviewed and negative.   EKGs/Labs/Other Studies Reviewed:    The following studies were reviewed today: PAP download  EKG:  EKG is not  ordered today.    Recent Labs: 12/21/2017: B Natriuretic Peptide 425.3 12/22/2017: Magnesium 1.8 01/15/2018: ALT 30; BUN 37; Creatinine, Ser 1.76; Hemoglobin 9.2; Platelets 179; Potassium 4.5; Sodium 133   Recent Lipid  Panel    Component Value Date/Time   CHOL 110 11/02/2017 0441   TRIG 69 11/02/2017 0441   HDL 36 (L) 11/02/2017 0441   CHOLHDL 3.1 11/02/2017 0441   VLDL 14 11/02/2017 0441   LDLCALC 60 11/02/2017 0441    Physical Exam:    VS:  BP (!) 94/58   Pulse 65   Ht 6' (1.829 m)   Wt 275 lb 6.4 oz (124.9 kg)   SpO2 97%   BMI 37.35 kg/m     Wt Readings from Last 3 Encounters:  01/15/18 277 lb (125.6 kg)  01/15/18 275 lb 6.4 oz (124.9 kg)  01/14/18 277 lb (125.6 kg)     GEN: 2 Well nourished, well developed in no acute distress HEENT: Normal NECK: No JVD; No carotid bruits LYMPHATICS: No lymphadenopathy CARDIAC: RRR, no murmurs, rubs, gallops RESPIRATORY:  Clear to auscultation without rales, wheezing or rhonchi  ABDOMEN: Soft, non-tender, non-distended MUSCULOSKELETAL:  No edema; No deformity  SKIN: Warm and dry NEUROLOGIC:  Alert and oriented x 3 PSYCHIATRIC:  Normal affect   ASSESSMENT:    1. OSA (obstructive sleep apnea)   2. Diarrhea, unspecified type   3. Orthostatic hypotension    PLAN:    In order of problems listed above:  1.  OSA - currently he is not using his CPAP due to what appears to be memory issues and recently living alone while his wife cares for aging parents.  He is adamant that he will never use PAP.  I have therefore recommended referral to ENT to see if he would be a candidate for INSPIRE device.    2.  Diarrhea with bloody stools - he has been having diarrhea for some time and has been having blood in his stools . I have encouraged his daughter to take him to the ER today for further evaluation   3.  Hypotension - ? Related to #2 - daughter agrees to take patient to ER for evaluation.   Medication Adjustments/Labs and Tests  Ordered: Current medicines are reviewed at length with the patient today.  Concerns regarding medicines are outlined above.  Orders Placed This Encounter  Procedures  . Ambulatory referral to ENT   No orders of the defined types were placed in this encounter.   Signed, Fransico Him, MD  01/15/2018 11:18 PM    [place]

## 2018-01-15 NOTE — ED Triage Notes (Addendum)
Patient to ED c/o rectal bleeding from hemorrhoids - reports pain and bleeding worsened today. Patient hypotensive in triage - he appears pale and lethargic, yawning every few seconds. A&O x 4. On coumadin.

## 2018-01-15 NOTE — Progress Notes (Signed)
I saw and evaluated the patient.  I personally confirmed the key portions of Dr. Guilloud's history and exam and reviewed pertinent patient test results.  The assessment, diagnosis, and plan were formulated together and I agree with the documentation in the resident's note. 

## 2018-01-15 NOTE — H&P (Signed)
Date: 01/15/2018               Patient Name:  Adam Savage MRN: [id number]  DOB: 1950-11-11 Age / Sex: 67 y.o., male   PCP: Patient, [name]         Medical Service: Internal Medicine Teaching Service         Attending Physician: Dr. Lucious Groves, DO    First Contact: Dr. Myrtie Hawk Pager: [phone]  Second Contact: Dr. Tarri Abernethy Pager: [phone]       After Hours (After 5p/  First Contact Pager: [phone]  weekends / holidays): Second Contact Pager: [phone]   Chief Complaint: Rectal pain, low hemoglobin  History of Present Illness: Adam Savage is a 67 year old male with PMHx. of DM, seizure, COPD, mechanical heart valve disease, Hep C and vasculitis, AVR on Warfarin, presented with worsening of rectal pain, rectal bleeding.  Initially when started interview, patient mentioned that he does not remember when and why he came to the hospital.  However after a few minutes reviewing his medical history, he started to recall and mentioned that he went to clinic yesterday for his hemorrhoid checkup.  He has history of hemorrhoid and mentions that he had some worsening of pain, rectal bleeding.  He mentions that when he used paper but he sees blood on it.  The blood is not mixed with his a stool.  He denies any nausea vomiting, abdominal pain, diarrhea.  But has been diagnosed with intertrigo and incontinence secondary to internal hemorrhoid that by PCP yesterday.  He was then given suppository and clotrimazole cream.    Further work-up showed low hemoglobin (8.1 comparing to 9.7-10), 3 weeks ago.  He denies chest pain, dizziness.  Meds:  Current Meds  Medication Sig  . acetaminophen (TYLENOL) 650 MG CR tablet Take 1,300 mg by mouth every 8 (eight) hours as needed for pain.  Marland Kitchen albuterol (PROVENTIL HFA;VENTOLIN HFA) 108 (90 Base) MCG/ACT inhaler Inhale 2 puffs into the lungs every 6 (six) hours as needed for wheezing or shortness of breath.  Marland Kitchen aspirin EC 81 MG tablet Take 81 mg by mouth daily.  Marland Kitchen  atorvastatin (LIPITOR) 10 MG tablet Take 1 tablet (10 mg total) by mouth daily.  . carvedilol (COREG) 6.25 MG tablet Take 1 tablet (6.25 mg total) by mouth 2 (two) times daily with a meal.  . clotrimazole (LOTRIMIN) 1 % cream Apply 1 application topically 2 (two) times daily. For 2 weeks  . furosemide (LASIX) 20 MG tablet Take 3 tablets (60 mg total) by mouth daily.  . hydrocortisone (ANUSOL-HC) 25 MG suppository Place 1 suppository (25 mg total) rectally 2 (two) times daily as needed for hemorrhoids or anal itching.  . insulin detemir (LEVEMIR) 100 UNIT/ML injection Inject 12 Units into the skin daily.  Marland Kitchen levETIRAcetam (KEPPRA) 1000 MG tablet Take 1 tablet (1,000 mg total) by mouth 2 (two) times daily.  Marland Kitchen lisinopril (PRINIVIL,ZESTRIL) 10 MG tablet Take 10 mg by mouth daily.  . mirtazapine (REMERON) 30 MG tablet Take 30 mg by mouth at bedtime.  . ramipril (ALTACE) 5 MG capsule Take 1 capsule (5 mg total) by mouth daily.  . ranitidine (ZANTAC) 150 MG tablet Take 150 mg by mouth 2 (two) times daily.   . tamsulosin (FLOMAX) 0.4 MG CAPS capsule Take 0.4 mg by mouth at bedtime.   . traZODone (DESYREL) 100 MG tablet Take 200 mg by mouth at bedtime.  Marland Kitchen warfarin (COUMADIN) 5 MG tablet Take 5 mg  by mouth See admin instructions. Take one tablet (5 mg) by mouth Monday, Tuesday, Wednesday, Friday, Saturday nights - 9pm  . warfarin (COUMADIN) 7.5 MG tablet Take 7.5 mg by mouth See admin instructions. Take one tablet (7.5 mg) by mouth on Sunday and Thursday nights - 9pm (take 5 mg tablet on all other days of the week)     Allergies: Allergies as of 01/15/2018 - Review Complete 01/15/2018  Allergen Reaction Noted  . Morphine and related Other (See Comments) 12/21/2017   Past Medical History:  Diagnosis Date  . COPD (chronic obstructive pulmonary disease) ([place])   . Diabetes mellitus without complication ([place])   . Mechanical heart valve present   . Pre-diabetes   . Seizures ([place])     Family History:    Hypertension in father  Social History:  Married Smoker 1 p/day No alcohol Not sexually active   Review of Systems: A complete ROS was negative except as per HPI.   Physical Exam: Blood pressure (!) 99/50, pulse 65, temperature (!) 97.2 F (36.2 C), temperature source Oral, resp. rate 18, SpO2 100 %.   Physical Exam  Constitutional: He appears well-developed. No distress.  Eyes: EOM are normal.  Cardiovascular: Normal rate, regular rhythm and normal heart sounds.  No murmur heard. Pulmonary/Chest: Effort normal and breath sounds normal. He has no rales.  Abdominal: Soft. There is no tenderness.  Musculoskeletal: He exhibits rashes on legs bilaterally 1+ bilateral dema.  Neurological: He is alert.  Skin: There is extended erythema. And  Tenderness on perianal and gluteal cleft area (Initially confused and not oriented but regained orientation during interview)  Assessment & Plan by Problem: Active Problems:   Perianal infection Rectal pain, rectal bleeding. With Hx of hemorrhoid. No Vomiting, melena.  CT pelvic did not show acute abnormality. Slightly elevated I stat lactic acid(2.01).  Hemorrhoid+ Intertrigo.  -Continue Clotrimazole cream -Continue Hydrocortisone supp -Senokot 1 tab BID -Lactic acid plasma  Normocytic Anemia: Hb 8.2, MCV:81 Patient has been on Warfarin. reports internal hemorrhoid,  Likely 2/2 hemorrhoid bleeding. denies other source of bleeding. Orthostatic BP:  Received PRBC at ED  S/p I unit blood transfusion at ED -monitor H& H q 12 h  Hx of AVR: CT pelvic was normal, source of bleeding seems to be hemorrhoid. Continue Warfarin and follow up Hb  -Continue Warfarin per pharmacy   DM: -Moderate SSI and CBG monitoring -Insulin Lantus 12 units at bed time   Seizure: Asymptomatic currently -Continue Keppra  HTN: BP 100/60 Currently  CHF -continue Lasix 60 mg PO QD and carvediolol -Monitor BP, consider adjusting BP meds if hypotensive  due to bleeding   Rash/purpura on legs: with Hx of Hep c. Previous biopsy showed leukocytoclastic vasculitis Elevated lactic acid. Under work up y ID per chart -Follow up with ID  Dispo: Admit patient to Observation with expected length of stay less than 2 midnights. Code status: Full IV fluid: none Diet: regular VTE ppx: Warfarin  Signed: Dewayne Hatch, MD 01/15/2018, 5:01 PM  Pager: [phone]

## 2018-01-16 DIAGNOSIS — R454 Irritability and anger: Secondary | ICD-10-CM

## 2018-01-16 DIAGNOSIS — D62 Acute posthemorrhagic anemia: Secondary | ICD-10-CM | POA: Diagnosis not present

## 2018-01-16 DIAGNOSIS — R569 Unspecified convulsions: Secondary | ICD-10-CM

## 2018-01-16 DIAGNOSIS — Z79899 Other long term (current) drug therapy: Secondary | ICD-10-CM

## 2018-01-16 DIAGNOSIS — K649 Unspecified hemorrhoids: Secondary | ICD-10-CM | POA: Diagnosis not present

## 2018-01-16 DIAGNOSIS — D692 Other nonthrombocytopenic purpura: Secondary | ICD-10-CM | POA: Diagnosis not present

## 2018-01-16 DIAGNOSIS — Z7901 Long term (current) use of anticoagulants: Secondary | ICD-10-CM | POA: Diagnosis not present

## 2018-01-16 DIAGNOSIS — I959 Hypotension, unspecified: Secondary | ICD-10-CM | POA: Diagnosis not present

## 2018-01-16 DIAGNOSIS — Z952 Presence of prosthetic heart valve: Secondary | ICD-10-CM | POA: Diagnosis not present

## 2018-01-16 DIAGNOSIS — E119 Type 2 diabetes mellitus without complications: Secondary | ICD-10-CM

## 2018-01-16 DIAGNOSIS — Z9889 Other specified postprocedural states: Secondary | ICD-10-CM

## 2018-01-16 LAB — CBC
HCT: 28.6 % — ABNORMAL LOW (ref 39.0–52.0)
HCT: 27.6 % — ABNORMAL LOW (ref 39.0–52.0)
Hemoglobin: 9.3 g/dL — ABNORMAL LOW (ref 13.0–17.0)
Hemoglobin: 8.7 g/dL — ABNORMAL LOW (ref 13.0–17.0)
MCH: 26.1 pg (ref 26.0–34.0)
MCH: 25.4 pg — ABNORMAL LOW (ref 26.0–34.0)
MCHC: 32.5 g/dL (ref 30.0–36.0)
MCHC: 31.5 g/dL (ref 30.0–36.0)
MCV: 80.1 fL (ref 78.0–100.0)
MCV: 80.7 fL (ref 78.0–100.0)
Platelets: 189 10*3/uL (ref 150–400)
Platelets: 170 10*3/uL (ref 150–400)
RBC: 3.57 MIL/uL — ABNORMAL LOW (ref 4.22–5.81)
RBC: 3.42 MIL/uL — ABNORMAL LOW (ref 4.22–5.81)
RDW: 14.3 % (ref 11.5–15.5)
RDW: 14.4 % (ref 11.5–15.5)
WBC: 7.6 10*3/uL (ref 4.0–10.5)
WBC: 7 10*3/uL (ref 4.0–10.5)

## 2018-01-16 LAB — COMPREHENSIVE METABOLIC PANEL
ALT: 34 U/L (ref 0–44)
AST: 45 U/L — ABNORMAL HIGH (ref 15–41)
Albumin: 2.7 g/dL — ABNORMAL LOW (ref 3.5–5.0)
Alkaline Phosphatase: 139 U/L — ABNORMAL HIGH (ref 38–126)
Anion gap: 7 (ref 5–15)
BUN: 36 mg/dL — ABNORMAL HIGH (ref 8–23)
CO2: 22 mmol/L (ref 22–32)
Calcium: 8.5 mg/dL — ABNORMAL LOW (ref 8.9–10.3)
Chloride: 104 mmol/L (ref 98–111)
Creatinine, Ser: 1.7 mg/dL — ABNORMAL HIGH (ref 0.61–1.24)
GFR calc Af Amer: 46 mL/min — ABNORMAL LOW (ref >60)
GFR calc non Af Amer: 40 mL/min — ABNORMAL LOW (ref >60)
Glucose, Bld: 207 mg/dL — ABNORMAL HIGH (ref 70–99)
Potassium: 4.2 mmol/L (ref 3.5–5.1)
Sodium: 133 mmol/L — ABNORMAL LOW (ref 135–145)
Total Bilirubin: 0.8 mg/dL (ref 0.3–1.2)
Total Protein: 6.4 g/dL — ABNORMAL LOW (ref 6.5–8.1)

## 2018-01-16 LAB — RAPID URINE DRUG SCREEN, HOSP PERFORMED
Amphetamines: NOT DETECTED
Barbiturates: NOT DETECTED
Benzodiazepines: NOT DETECTED
Cocaine: NOT DETECTED
Opiates: NOT DETECTED
Tetrahydrocannabinol: NOT DETECTED

## 2018-01-16 LAB — GLUCOSE, CAPILLARY
Glucose-Capillary: 234 mg/dL — ABNORMAL HIGH (ref 70–99)
Glucose-Capillary: 217 mg/dL — ABNORMAL HIGH (ref 70–99)
Glucose-Capillary: 177 mg/dL — ABNORMAL HIGH (ref 70–99)
Glucose-Capillary: 188 mg/dL — ABNORMAL HIGH (ref 70–99)
Glucose-Capillary: 275 mg/dL — ABNORMAL HIGH (ref 70–99)

## 2018-01-16 LAB — PROTIME-INR
INR: 2.28
Prothrombin Time: 25 seconds — ABNORMAL HIGH (ref 11.4–15.2)

## 2018-01-16 MED ORDER — SODIUM CHLORIDE 0.9 % IV BOLUS: 500.0000 mL | Freq: Once | INTRAVENOUS | Status: DC

## 2018-01-16 MED ORDER — SODIUM CHLORIDE 0.9 % IV BOLUS
500.0000 mL | Freq: Once | INTRAVENOUS | Status: AC
  Administered 2018-01-16: 500 mL via INTRAVENOUS

## 2018-01-16 MED ORDER — SODIUM CHLORIDE 0.9 % IV SOLN
INTRAVENOUS | Status: DC
  Administered 2018-01-16: 22:00:00 via INTRAVENOUS

## 2018-01-16 MED ORDER — NICOTINE 7 MG/24HR TD PT24
7.0000 mg | MEDICATED_PATCH | Freq: Every day | TRANSDERMAL | Status: DC
  Administered 2018-01-16 – 2018-01-30 (×15): 7 mg via TRANSDERMAL
  Filled 2018-01-16 (×15): qty 1

## 2018-01-16 MED ORDER — OXYCODONE-ACETAMINOPHEN 5-325 MG PO TABS
1.0000 | ORAL_TABLET | Freq: Four times a day (QID) | ORAL | Status: DC | PRN
  Administered 2018-01-16 – 2018-01-22 (×10): 1 via ORAL
  Filled 2018-01-16 (×10): qty 1

## 2018-01-16 MED ORDER — WARFARIN SODIUM 5 MG PO TABS
5.0000 mg | ORAL_TABLET | Freq: Once | ORAL | Status: AC
  Administered 2018-01-16: 5 mg via ORAL
  Filled 2018-01-16: qty 1

## 2018-01-16 MED ORDER — WARFARIN - PHARMACIST DOSING INPATIENT: Freq: Every day | Status: DC

## 2018-01-16 NOTE — Progress Notes (Signed)
Paged by RN about confusion. Examined and evaluated patient at bedside. States difficulty remembering how or why he got to the hospital. Was AAO x2 to name and location. Able to describe his hemorrhoids as being his chief reason for admission. States he feels irritated because he wants to eat breakfast. He states 'when are the eggs coming?' Significantly less irritated after walking down the hallway. States he feels frustrated and tired of his deteriorating health. Describes sadness about his divorce and his feelings of being burdensome to his daughter. Denies any SA/HA/Hallucinations. States he was smoking cigarettes after his last hospitalization when he could to relieve stress. States he feels much more relieved after speaking about his problems. Returned to room and states he will wait for breakfast.  - Nicotine patch - Delirium precautions - Please try to redirect when agitated

## 2018-01-16 NOTE — Progress Notes (Addendum)
Paged provider, pt BP 83/56 after 519mL bolus.    2018 paged second contact - ordered EKG and they are coming up to see him.  0200 patient has concerns since he cannot sleep and "doesn't know what he should be doing." reminded patient it was nighttime and he should be sleeping.  Patient requested a sleep aid. Paged provider, trazadone used at home will be ordered for prn use tonight. Patient agreed to try it and went back to room.  0400 provider paged per patient request. Trazadone has not been effective. Walks halls saying he wants to sleep but will not stay in room. No complaints of pain, but agitated and loud when speaking to nurse.   0440 Provider coming to visit patient on floor.

## 2018-01-16 NOTE — Progress Notes (Signed)
   Subjective: Patient was seen and evaluated at bedside on morning rounds. No acute events overnight. He endorses still has some minor rectal bleeding. His pain is better. No constipation. He is angry today because he is starving and no breakfast came yet.  Objective:  Vital signs in last 24 hours: Vitals:   01/15/18 1840 01/16/18 0438 01/16/18 1316 01/16/18 1700  BP:  (!) 91/49 (!) 109/53 (!) 88/48  Pulse:  66 66 70  Resp: 18 16 18    Temp:  (!) 97.3 F (36.3 C) 97.6 F (36.4 C)   TempSrc:  Oral Oral   SpO2:  100% 99% 99%  Weight:      Height:       Physical Exam  Constitutional: He appears well-developed and well-nourished.  Cardiovascular: Normal rate, regular rhythm, normal heart sounds and intact distal pulses.  No murmur heard. Pulmonary/Chest: Effort normal and breath sounds normal. No respiratory distress. He has no wheezes. He has no rales.  Abdominal/GI : Soft. Bowel sounds are normal. He exhibits mild distension. There is no tenderness. There is no rebound. Has erythematous skin and tenderness rash at peranal area. Musculoskeletal: He exhibits 1+ bilateral edema.  Skin: Rash noted.  Psychiatric: He is sometimes verbally aggressive in some degree (was angry of no body brought his breakfast yet. but this, resolved on the next visit same working. Thought content is not paranoid and not delusional. )  Assessment/Plan:  Active Problems:   Perianal infection  67 year old male with past medical history of diabetes, aortic valve replacement on warfarin, seizure, hemorrhoid presented with some rectal bleeding, rectal pain and anemia. He admitted for asymptomatic anemia and rectal pain.  Status post 1 unit of packed cell transfusion, hemoglobin elevated to 9.2.  (From 8.1 on admission) Patient reports minor blood on toilet paper otherwise no recurrent nausea.  Denies any abdominal pain, nausea or vomiting. No constipation or diarrhea. History of hemorrhoid, blood on toilet  paper and and mild drop in hemoglobin on arrival (8.1 from 9), and no other constitutional or GI symptoms, anemia is likely secondary to hemorrhoid bleeding. Currently stable and does not report severe bleeding. Hb has been stable. No further work up at this time is required. And planned to discharge to be evaluated in clinic in 1 week to recheck CBC. How ever when I talked to patient's daughter, she mentions that she can not take him home. As he has decreases capacity for taking care of him self.  -PT-OT eval--> recommended SNF. Will wait for approval. -Social worker consult--> Asked to talk to the daughter for current and long term nursing facility.   -Will continue hydrocortisone suppository, clotrimazole cream, Senokot and observe patient until SNF placement establishes. -Follow-up in clinic within 1 week after discharge for CBC.  AVR: Has mechanical valve: -Continue Warfarin  Seizure: Stable and asymptomatic -Continue Keppra  Rash/purpura on legs: with Hx of Hep c. Previous biopsy showed leukocytoclastic vasculitis Elevated lactic acid. Under work up y ID per chart -Follow up with ID  Addendum: -Patient has been hypotensive in the evening (BP:88/48).  No active bleeding. is asymptomatic.  -Hold Lasix -Give 500 mg NS bolus -Monitor patient Vital sign -Continue carvedilol   Dispo: Discharge in 1 day after SNF placement  Dewayne Hatch, MD 01/16/2018, 5:51 PM Pager: [phone]

## 2018-01-16 NOTE — NC FL2 (Signed)
[place] MEDICAID FL2 LEVEL OF CARE SCREENING TOOL     IDENTIFICATION  Patient Name: Adam Savage Birthdate: 1950/10/10 Sex: male Admission Date (Current Location): 01/15/2018  [facility] and [place] Number:  Manufacturing engineer and Address:  [facility] [place], [place] [address] [place], [place] [address]      Provider Number: [id number]  Attending Physician Name and Address:  Lucious Groves, DO  Relative Name and Phone Number:       Current Level of Care: Hospital Recommended Level of Care: [place] Prior Approval Number:    Date Approved/Denied:   PASRR Number: [id number]  Discharge Plan: SNF    Current Diagnoses: Patient Active Problem List   Diagnosis Date Noted  . Perianal infection 01/15/2018  . Diarrhea 01/15/2018  . Hypotension 01/15/2018  . Internal hemorrhoids 01/14/2018  . Intertrigo 01/14/2018  . Chronic hepatitis C without hepatic coma ([place]) 01/12/2018  . 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 Leg swelling 12/22/2017  . Encounter for therapeutic drug monitoring 11/06/2017  . Non-ST elevation (NSTEMI) myocardial infarction ([place])   . Anticoagulant long-term use   . Unstable angina ([place]) 10/29/2017  . Chest pain 10/28/2017  . Seizures ([place])   . COPD (chronic obstructive pulmonary disease) ([place])   . Pre-diabetes   . Overdose of muscle relaxant 08/11/2017  . Folic acid deficiency anemia 06/20/2016  . Obesity 09/21/2015  . Fatty liver disease, nonalcoholic 09/32/3557  . Elevated liver enzymes 07/28/2015  . Osteoarthritis 07/28/2015  . OSA (obstructive sleep apnea) 05/05/2015  . Joint pain 11/12/2013  . Tension headache, chronic 08/25/2013  . Status post lobectomy of brain 06/28/2013  . Epilepsy ([place]) 11/03/2012  . H/O diverticulitis of colon 11/03/2012  . Mechanical heart valve present 09/05/2012    Orientation RESPIRATION BLADDER Height & Weight     Self, Time, Place  Normal(Home CPAP)  Continent Weight: 277 lb (125.6 kg) Height:  6' (182.9 cm)  BEHAVIORAL SYMPTOMS/MOOD NEUROLOGICAL BOWEL NUTRITION STATUS  Other (Comment)(Restless) Convulsions/Seizures Continent Diet(Heart healthy/carb modified)  AMBULATORY STATUS COMMUNICATION OF NEEDS Skin   Limited Assist Verbally Bruising, Other (Comment)(MASD, Rash.)                       Personal Care Assistance Level of Assistance  Bathing, Feeding, Dressing Bathing Assistance: Limited assistance Feeding assistance: Independent Dressing Assistance: Limited assistance     Functional Limitations Info  Sight, Hearing, Speech Sight Info: Adequate Hearing Info: Adequate Speech Info: Adequate    SPECIAL CARE FACTORS FREQUENCY  PT (By licensed PT), OT (By licensed OT)     PT Frequency: 5 x week OT Frequency: 5 x week            Contractures Contractures Info: Not present    Additional Factors Info  Code Status, Allergies Code Status Info: Full code Allergies Info: Morphine and related   Insulin Sliding Scale Info: 3x/day with meals and at bedtime       Current Medications (01/16/2018):  This is the current hospital active medication list Current Facility-Administered Medications  Medication Dose Route Frequency Provider Last Rate Last Dose  . 0.9 %  sodium chloride infusion (Manually program via Guardrails IV Fluids)   Intravenous Once Ina Homes, MD      . atorvastatin (LIPITOR) tablet 10 mg  10 mg Oral Daily Ina Homes, MD   10 mg at 01/16/18 1036  . carvedilol (COREG) tablet 6.25 mg  6.25 mg Oral BID  WC Helberg, Larkin Ina, MD   6.25 mg at 01/16/18 0819  . clotrimazole (LOTRIMIN) 1 % cream 1 application  1 application Topical BID Helberg, Justin, MD   1 application at 53/64/68 1037  . furosemide (LASIX) tablet 60 mg  60 mg Oral Daily Helberg, Larkin Ina, MD   Stopped at 01/16/18 1037  . hydrocortisone (ANUSOL-HC) suppository 25 mg  25 mg Rectal BID PRN Helberg, Larkin Ina, MD      . insulin aspart (novoLOG)  injection 0-15 Units  0-15 Units Subcutaneous TID WC Ina Homes, MD   3 Units at 01/16/18 1300  . insulin aspart (novoLOG) injection 0-5 Units  0-5 Units Subcutaneous QHS Ina Homes, MD   2 Units at 01/15/18 2311  . insulin glargine (LANTUS) injection 12 Units  12 Units Subcutaneous QHS Ina Homes, MD   12 Units at 01/15/18 2254  . levETIRAcetam (KEPPRA) tablet 1,000 mg  1,000 mg Oral BID Helberg, Justin, MD   1,000 mg at 01/16/18 1036  . nicotine (NICODERM CQ - dosed in mg/24 hr) patch 7 mg  7 mg Transdermal Daily [name], MD   7 mg at 01/16/18 1035  . oxyCODONE (Oxy IR/ROXICODONE) immediate release tablet 5 mg  5 mg Oral Q4H PRN Ina Homes, MD   5 mg at 01/16/18 1559  . senna-docusate (Senokot-S) tablet 1 tablet  1 tablet Oral BID Ina Homes, MD   1 tablet at 01/16/18 1036  . tamsulosin (FLOMAX) capsule 0.4 mg  0.4 mg Oral QHS Helberg, Larkin Ina, MD   0.4 mg at 01/15/18 2254  . warfarin (COUMADIN) tablet 5 mg  5 mg Oral ONCE-1800 Rush Barer,       . Warfarin - Pharmacist Dosing Inpatient   Does not apply q1800 Rush Barer, [facility]         Discharge Medications: Please see discharge summary for a list of discharge medications.  Relevant Imaging Results:  Relevant Lab Results:   Additional Information SS#: [id number]  Candie Chroman, LCSW

## 2018-01-16 NOTE — Evaluation (Signed)
Occupational Therapy Evaluation Patient Details Name: Adam Savage MRN: [id number] DOB: 1950/09/20 Today's Date: 01/16/2018    History of Present Illness Pt is a 67 y/o male admitted secondary to increased rectal pain, rectal bleeding, and hemorrhoids. Thought to have perianal infection. PMH includes hepatitis C, AVR, DM, COPD, and seizures.    Clinical Impression   This 66 y/o male presents with the above. Pt reports at baseline he was completing ADLs without assist, occasionally using RW for mobility. Pt completing functional mobility using RW with overall minguardA. Currently requires minguard-minA for UB, LB and standing grooming ADLs. Pt demonstrating cognitive impairments throughout session requiring cues for recall of multi step instruction as well as for safety and attending to tasks. Unsure of available of caregiver support at home, though feel pt will require 24hr supervision due to impaired cognition and functional status. Currently recommend SNF level therapies at time of discharge. Will continue to follow acutely to progress pt towards established OT goals.     Follow Up Recommendations  SNF;Supervision/Assistance - 24 hour    Equipment Recommendations  None recommended by OT           Precautions / Restrictions Precautions Precautions: Fall Restrictions Weight Bearing Restrictions: No      Mobility Bed Mobility Overal bed mobility: Needs Assistance Bed Mobility: Supine to Sit;Sit to Supine     Supine to sit: Supervision Sit to supine: Supervision   General bed mobility comments: Supervision for safety as pt tended to be impulsive.   Transfers Overall transfer level: Needs assistance Equipment used: Rolling walker (2 wheeled) Transfers: Sit to/from Stand Sit to Stand: Min guard         General transfer comment: Min guard for safety. Cues to wait for OT as pt was going to stand without assist.    Balance Overall balance assessment: Needs  assistance Sitting-balance support: No upper extremity supported;Feet supported Sitting balance-Leahy Scale: Fair     Standing balance support: Bilateral upper extremity supported;During functional activity;Single extremity supported Standing balance-Leahy Scale: Poor Standing balance comment: Reliant on UE support                            ADL either performed or assessed with clinical judgement   ADL Overall ADL's : Needs assistance/impaired Eating/Feeding: Set up;Sitting   Grooming: Wash/dry hands;Oral care;Min guard;Minimal assistance;Standing   Upper Body Bathing: Min guard;Sitting   Lower Body Bathing: Min guard;Sit to/from stand   Upper Body Dressing : Set up;Sitting   Lower Body Dressing: Min guard;Sit to/from stand Lower Body Dressing Details (indicate cue type and reason): pt easily able to perform figure 4 for sock management; minguard for standing balance Toilet Transfer: Min guard;Minimal assistance;Ambulation;Regular Toilet   Toileting- Clothing Manipulation and Hygiene: Minimal assistance;Sit to/from stand       Functional mobility during ADLs: Surveyor, minerals     Praxis      Pertinent Vitals/Pain Pain Assessment: Faces Faces Pain Scale: Hurts little more Pain Location: back  Pain Descriptors / Indicators: Grimacing;Guarding Pain Intervention(s): Limited activity within patient's tolerance;Monitored during session;Repositioned     Hand Dominance     Extremity/Trunk Assessment Upper Extremity Assessment Upper Extremity Assessment: Generalized weakness   Lower Extremity Assessment Lower Extremity Assessment: Defer to PT evaluation       Communication Communication Communication: No difficulties   Cognition Arousal/Alertness: Awake/alert Behavior During  Therapy: Impulsive Overall Cognitive Status: Impaired/Different from baseline Area of Impairment: Safety/judgement;Problem  solving;Following commands;Memory;Awareness;Attention                   Current Attention Level: Sustained Memory: Decreased recall of precautions;Decreased short-term memory Following Commands: Follows multi-step commands inconsistently;Follows multi-step commands with increased time Safety/Judgement: Decreased awareness of safety;Decreased awareness of deficits Awareness: Emergent Problem Solving: Slow processing;Requires verbal cues General Comments: Pt oriented X4. Pt very easily distractable during session. At start of session (right after end of PT session, pt's bed alarm going off; at end of OT session soon after exiting room pt's bed alarm again going off. provided pt with 2-step commands (ADL tasks) to perform during session, upon entering bathroom pt unable to recall tasks provided earlier; once given repetition of tasks pt able to perform (x2 tasks) without requiring additional cues. Though when exiting bathroom requiring cues to remember to take RW with him as he attempts to walk away without it   General Comments  No family present during session. Feel pt will require 24/7 support at d/c    Exercises     Shoulder Instructions      Home Living Family/patient expects to be discharged to:: Skilled nursing facility Living Arrangements: Children Available Help at Discharge: Family;Available PRN/intermittently Type of Home: House       Home Layout: One level     Bathroom Shower/Tub: Teacher, early years/pre: Standard     Home Equipment: Environmental consultant - 2 wheels;Cane - single point;Shower seat   Additional Comments: Pt reports he lives with his daughter to PT. However, after OT session, OT reports pt lives alone and daughter checks on him. Unsure of reliability of information. Also reports he cannot go back to daughter's secondary to hep C infection.      Prior Functioning/Environment Level of Independence: Independent with assistive device(s)         Comments: Reports he uses his RW for ambulation sometimes.        OT Problem List: Decreased strength;Decreased range of motion;Decreased activity tolerance;Decreased safety awareness;Decreased cognition      OT Treatment/Interventions: Self-care/ADL training;Therapeutic exercise;Neuromuscular education;DME and/or AE instruction;Therapeutic activities;Cognitive remediation/compensation;Patient/family education    OT Goals(Current goals can be found in the care plan section) Acute Rehab OT Goals Patient Stated Goal: none stated  OT Goal Formulation: With patient Time For Goal Achievement: 01/30/18 Potential to Achieve Goals: Good  OT Frequency: Min 2X/week   Barriers to D/C:            Co-evaluation              AM-PAC PT "6 Clicks" Daily Activity     Outcome Measure Help from another person eating meals?: None Help from another person taking care of personal grooming?: A Little Help from another person toileting, which includes using toliet, bedpan, or urinal?: A Little Help from another person bathing (including washing, rinsing, drying)?: A Little Help from another person to put on and taking off regular upper body clothing?: A Little Help from another person to put on and taking off regular lower body clothing?: A Little 6 Click Score: 19   End of Session Equipment Utilized During Treatment: Gait belt;Rolling walker Nurse Communication: Mobility status  Activity Tolerance: Patient tolerated treatment well Patient left: in bed;with call bell/phone within reach;with bed alarm set  OT Visit Diagnosis: Muscle weakness (generalized) (M62.81);Other symptoms and signs involving cognitive function  Time: 0092-3300 OT Time Calculation (min): 22 min Charges:  OT General Charges $OT Visit: 1 Visit OT Evaluation $OT Eval Moderate Complexity: 1 Mod  Lou Cal, OT [employer] Pager [phone] Office  [phone]   Raymondo Band 01/16/2018, 4:44 PM

## 2018-01-16 NOTE — Progress Notes (Addendum)
Paged about BP of 83/56 after 1/2 NS. Examined and evaluated patient at bedside. He states he feels fine. Denies any chest pain, palpitations, dyspnea, light-headedness or dizziness. Only complaint of mild pruritus of his left lower leg rash and it appears to have grown larger than earlier this morning. Explained to the pt that we were concerned about his blood pressure, but patient states it usually runs low which is confirmed on chart review (last 3 office visits 94/58, 112/52, 94/58). Denies any BRBPR. States his stool was black but is usually black anyway. When asked about his CPAP use, states even though he has it at home, he does not use it because it is uncomfortable and he does not want to use it tonight.  Physical Exam: Re-measured bp at bedside: 105/47 Gen: Well-appearing, pleasant HEENT: MMM, EOMI, PERRL Neck: no JVD CV: RRR, Loud S1, No murmur, gallops Lungs: CTAB, No rales, wheeze Skin: Left sided red-purple palpable purpura around calf and extending down to ankle more wider in area compared to morning    A/P: -Baseline BP usually around 100/50 -PM hemoglobin 8.7 -Hold evening Coreg dose -Maintenance fluid NS at 125cc/hr for 10hrs overnight -Re-assess in AM

## 2018-01-16 NOTE — Progress Notes (Signed)
Internal Medicine Attending:   I saw and examined the patient. I reviewed the Dr Darcey Nora note and I agree with the resident's findings and plan as documented in the resident's note. No evidence of significant bleeding. Source of anemia is likely internal hemorrhoids, would not pursue further acute inpatient workup.  He does appear to be struggling to care for self at home even with help from daughter, we have asked social work to assist with possible placement to SNF.

## 2018-01-16 NOTE — Progress Notes (Signed)
Inpatient Diabetes Program Recommendations  AACE/ADA: New Consensus Statement on Inpatient Glycemic Control (2015)  Target Ranges:  Prepandial:   less than 140 mg/dL      Peak postprandial:   less than 180 mg/dL (1-2 hours)      Critically ill patients:  140 - 180 mg/dL   Results for NISSIM, FLEISCHER (MRN [id number]) as of 01/16/2018 11:32  Ref. Range 01/15/2018 18:53 01/15/2018 23:07 01/16/2018 09:26  Glucose-Capillary Latest Ref Range: 70 - 99 mg/dL 119 (H) 234 (H) 217 (H)   Review of Glycemic Control  Diabetes history: DM 2 Outpatient Diabetes medications: Levemir 12 units Current orders for Inpatient glycemic control: Lantus 12 units, Novolog 0-15 units tid, Novolog 0-5 units qhs  Inpatient Diabetes Program Recommendations:    Glucose trends in the 200 range. If patient remains inpatient today, consider increasing Lantus to 16 units.  Due to renal function may consider decreasing Novolog Correction to a sensitive correction.  Thanks,  Tama Headings RN, MSN, BC-ADM Inpatient Diabetes Coordinator Team Pager [phone] (8a-5p)

## 2018-01-16 NOTE — Progress Notes (Signed)
Pt have episodes of confusion this AM. Called  his daughter per pt request. Per Caryl Pina this confusion is not new for him and feel that is not safe for him to go back home. Asking if confusion can be address by MD too.

## 2018-01-16 NOTE — Evaluation (Signed)
Physical Therapy Evaluation Patient Details Name: Adam Savage MRN: [id number] DOB: 1951/02/22 Today's Date: 01/16/2018   History of Present Illness  Pt is a 67 y/o male admitted secondary to increased rectal pain, rectal bleeding, and hemorrhoids. Thought to have perianal infection. PMH includes hepatitis C, AVR, DM, COPD, and seizures.   Clinical Impression  Pt admitted secondary to problem above with deficits below. Pt with very poor safety awareness (leaving RW at door upon entry to room, etc) and easily distractible during gait. Pt with memory deficits as well and reporting different information to PT and OT. Per RN, pt has not been safe at home and has been wandering outside of home. Pt reports he has a daughter, but per notes/RN, daughter not able to assist 24/7. Feel pt will require increased assist and supervision at d/c. Will continue to follow acutely to maximize functional mobility independence and safety.     Follow Up Recommendations SNF;Supervision/Assistance - 24 hour    Equipment Recommendations  None recommended by PT    Recommendations for Other Services       Precautions / Restrictions Precautions Precautions: Fall Restrictions Weight Bearing Restrictions: No      Mobility  Bed Mobility Overal bed mobility: Needs Assistance Bed Mobility: Supine to Sit;Sit to Supine     Supine to sit: Supervision Sit to supine: Supervision   General bed mobility comments: Supervision for safety as pt tended to be impulsive.   Transfers Overall transfer level: Needs assistance Equipment used: Rolling walker (2 wheeled) Transfers: Sit to/from Stand Sit to Stand: Min guard         General transfer comment: Min guard for safety. Cues to wait for PT as pt was going to stand without assist.  Ambulation/Gait Ambulation/Gait assistance: Min guard Gait Distance (Feet): 125 Feet Assistive device: Rolling walker (2 wheeled) Gait Pattern/deviations: Step-through  pattern;Decreased stride length Gait velocity: decreased   General Gait Details: Cues for safety with RW. Pt with decreased safety awareness and very easily distracted during gait. Going on tangents with discussion, often leading back to how "dry" his lunch was. Pt leaving RW at door upon return to room and ambulating back to bed without AD. Mild unsteadiness noted without AD.   Stairs            Wheelchair Mobility    Modified Rankin (Stroke Patients Only)       Balance Overall balance assessment: Needs assistance Sitting-balance support: No upper extremity supported;Feet supported Sitting balance-Leahy Scale: Fair     Standing balance support: Bilateral upper extremity supported;During functional activity Standing balance-Leahy Scale: Poor Standing balance comment: Reliant on BUE support                              Pertinent Vitals/Pain Pain Assessment: Faces Faces Pain Scale: Hurts little more Pain Location: back  Pain Descriptors / Indicators: Grimacing;Guarding Pain Intervention(s): Limited activity within patient's tolerance;Monitored during session;Repositioned    Home Living Family/patient expects to be discharged to:: Skilled nursing facility Living Arrangements: Children Available Help at Discharge: Family;Available PRN/intermittently Type of Home: House       Home Layout: One level Home Equipment: [place] - 2 wheels;Cane - single point;Shower seat Additional Comments: Pt reports he lives with his daughter to PT. However, after OT session, OT reports pt lives alone and daughter checks on him. Unsure of reliability of information. Also reports he cannot go back to daughter's secondary to hep C infection.  Prior Function Level of Independence: Independent with assistive device(s)         Comments: Reports he uses his RW for ambulation sometimes.     Hand Dominance        Extremity/Trunk Assessment   Upper Extremity  Assessment Upper Extremity Assessment: Defer to OT evaluation    Lower Extremity Assessment Lower Extremity Assessment: Generalized weakness       Communication   Communication: No difficulties  Cognition Arousal/Alertness: Awake/alert Behavior During Therapy: Impulsive Overall Cognitive Status: Impaired/Different from baseline Area of Impairment: Safety/judgement;Problem solving;Following commands;Memory;Awareness;Attention                   Current Attention Level: Sustained Memory: Decreased recall of precautions;Decreased short-term memory Following Commands: Follows multi-step commands inconsistently;Follows multi-step commands with increased time Safety/Judgement: Decreased awareness of safety;Decreased awareness of deficits Awareness: Emergent Problem Solving: Slow processing;Requires verbal cues General Comments: Pt oriented X4. Pt very easily distractable during gait and left RW at the door when entering his room. Upon leaving the room, pt immediately trying to get up as pt's bed alarm going off. OT went into room to address bed alarm.       General Comments General comments (skin integrity, edema, etc.): No family present during session. Feel pt will require 24/7 support at d/c    Exercises     Assessment/Plan    PT Assessment Patient needs continued PT services  PT Problem List Decreased strength;Decreased balance;Decreased mobility;Decreased coordination;Decreased knowledge of use of DME;Decreased safety awareness;Decreased knowledge of precautions;Pain;Decreased cognition       PT Treatment Interventions DME instruction;Gait training;Stair training;Functional mobility training;Therapeutic activities;Therapeutic exercise;Patient/family education;Balance training;Cognitive remediation    PT Goals (Current goals can be found in the Care Plan section)  Acute Rehab PT Goals Patient Stated Goal: none stated  PT Goal Formulation: With patient Time For Goal  Achievement: 01/30/18 Potential to Achieve Goals: Good    Frequency Min 2X/week   Barriers to discharge Other (comment) Unsure of caregiver support at home     Co-evaluation               AM-PAC PT "6 Clicks" Daily Activity  Outcome Measure Difficulty turning over in bed (including adjusting bedclothes, sheets and blankets)?: None Difficulty moving from lying on back to sitting on the side of the bed? : A Little Difficulty sitting down on and standing up from a chair with arms (e.g., wheelchair, bedside commode, etc,.)?: Unable Help needed moving to and from a bed to chair (including a wheelchair)?: A Little Help needed walking in hospital room?: A Little Help needed climbing 3-5 steps with a railing? : A Lot 6 Click Score: 16    End of Session   Activity Tolerance: Patient tolerated treatment well Patient left: in bed;with call bell/phone within reach;with bed alarm set Nurse Communication: Mobility status PT Visit Diagnosis: Other abnormalities of gait and mobility (R26.89)    Time: 1950-9326 PT Time Calculation (min) (ACUTE ONLY): 19 min   Charges:   PT Evaluation $PT Eval Moderate Complexity: [place], PT, DPT  Acute Rehabilitation Services  Pager: [phone] Office: [phone]   Rudean Hitt 01/16/2018, 4:07 PM

## 2018-01-16 NOTE — Progress Notes (Signed)
[facility] for warfarin Indication: Mechanical Valve  Allergies  Allergen Reactions  . Morphine And Related Other (See Comments)    Combative     Patient Measurements: Height: 6' (182.9 cm) Weight: 277 lb (125.6 kg) IBW/kg (Calculated) : 77.6 Heparin Dosing Weight:   Vital Signs: Temp: 97.3 F (36.3 C) (10/04 0438) Temp Source: Oral (10/04 0438) BP: 91/49 (10/04 0438) Pulse Rate: 66 (10/04 0438)  Labs: Recent Labs    01/14/18 1209  01/15/18 1035 01/15/18 1801 01/16/18 0550  HGB  --    < > 8.1* 9.2* 9.3*  HCT  --   --  26.0* 28.5* 28.6*  PLT  --   --  163 179 189  LABPROT  --   --  28.9*  --  25.0*  INR  --   --  2.76  --  2.28  CREATININE 1.51*  --  1.76*  --  1.70*   < > = values in this interval not displayed.    Estimated Creatinine Clearance: 57.7 mL/min (A) (by C-G formula based on SCr of 1.7 mg/dL (H)).   Medications:   Assessment: 67 yo M admitted to for rectal pain and low Hgb on chronic anticoagulation for mechanical heart valve. Pharmacy consulted for warfarin dosing. Some rectal bleeding noted but seems to be from hemorrhoids so will continue warfarin but follow up on bleeding and Hgb closely. Hgb improved to 9.3 today. INR therapeutic at  2.28.   PTA warfarin 5 mg - M/T/W/F/Sat; warfarin 7.5 mg - Sun/Th - at 9 pm   Goal of Therapy:  INR 2-3 Monitor platelets by anticoagulation protocol: Yes   Plan:  Warfarin 5mg  x1 Daily INR  Harrietta Guardian, PharmD PGY1 Pharmacy Resident 01/16/2018    8:02 AM

## 2018-01-17 ENCOUNTER — Observation Stay (HOSPITAL_COMMUNITY): Payer: Medicare Other

## 2018-01-17 DIAGNOSIS — I130 Hypertensive heart and chronic kidney disease with heart failure and stage 1 through stage 4 chronic kidney disease, or unspecified chronic kidney disease: Secondary | ICD-10-CM | POA: Diagnosis present

## 2018-01-17 DIAGNOSIS — Y92230 Patient room in hospital as the place of occurrence of the external cause: Secondary | ICD-10-CM | POA: Diagnosis not present

## 2018-01-17 DIAGNOSIS — D500 Iron deficiency anemia secondary to blood loss (chronic): Secondary | ICD-10-CM | POA: Diagnosis not present

## 2018-01-17 DIAGNOSIS — K208 Other esophagitis: Secondary | ICD-10-CM | POA: Diagnosis not present

## 2018-01-17 DIAGNOSIS — K922 Gastrointestinal hemorrhage, unspecified: Secondary | ICD-10-CM | POA: Diagnosis not present

## 2018-01-17 DIAGNOSIS — J9601 Acute respiratory failure with hypoxia: Secondary | ICD-10-CM | POA: Diagnosis not present

## 2018-01-17 DIAGNOSIS — F0280 Dementia in other diseases classified elsewhere without behavioral disturbance: Secondary | ICD-10-CM | POA: Diagnosis not present

## 2018-01-17 DIAGNOSIS — G9340 Encephalopathy, unspecified: Secondary | ICD-10-CM | POA: Diagnosis not present

## 2018-01-17 DIAGNOSIS — Z952 Presence of prosthetic heart valve: Secondary | ICD-10-CM | POA: Diagnosis not present

## 2018-01-17 DIAGNOSIS — G92 Toxic encephalopathy: Secondary | ICD-10-CM | POA: Diagnosis not present

## 2018-01-17 DIAGNOSIS — I959 Hypotension, unspecified: Secondary | ICD-10-CM | POA: Diagnosis not present

## 2018-01-17 DIAGNOSIS — R0689 Other abnormalities of breathing: Secondary | ICD-10-CM | POA: Diagnosis not present

## 2018-01-17 DIAGNOSIS — D72829 Elevated white blood cell count, unspecified: Secondary | ICD-10-CM | POA: Diagnosis not present

## 2018-01-17 DIAGNOSIS — R451 Restlessness and agitation: Secondary | ICD-10-CM | POA: Diagnosis not present

## 2018-01-17 DIAGNOSIS — Z79899 Other long term (current) drug therapy: Secondary | ICD-10-CM | POA: Diagnosis not present

## 2018-01-17 DIAGNOSIS — D6489 Other specified anemias: Secondary | ICD-10-CM | POA: Diagnosis not present

## 2018-01-17 DIAGNOSIS — L304 Erythema intertrigo: Secondary | ICD-10-CM | POA: Diagnosis not present

## 2018-01-17 DIAGNOSIS — F05 Delirium due to known physiological condition: Secondary | ICD-10-CM | POA: Diagnosis not present

## 2018-01-17 DIAGNOSIS — K228 Other specified diseases of esophagus: Secondary | ICD-10-CM | POA: Diagnosis not present

## 2018-01-17 DIAGNOSIS — Z7189 Other specified counseling: Secondary | ICD-10-CM | POA: Diagnosis not present

## 2018-01-17 DIAGNOSIS — B1920 Unspecified viral hepatitis C without hepatic coma: Secondary | ICD-10-CM | POA: Diagnosis not present

## 2018-01-17 DIAGNOSIS — R571 Hypovolemic shock: Secondary | ICD-10-CM | POA: Diagnosis not present

## 2018-01-17 DIAGNOSIS — G40909 Epilepsy, unspecified, not intractable, without status epilepticus: Secondary | ICD-10-CM | POA: Diagnosis not present

## 2018-01-17 DIAGNOSIS — I10 Essential (primary) hypertension: Secondary | ICD-10-CM | POA: Diagnosis not present

## 2018-01-17 DIAGNOSIS — G4089 Other seizures: Secondary | ICD-10-CM | POA: Diagnosis not present

## 2018-01-17 DIAGNOSIS — Z515 Encounter for palliative care: Secondary | ICD-10-CM | POA: Diagnosis not present

## 2018-01-17 DIAGNOSIS — A419 Sepsis, unspecified organism: Secondary | ICD-10-CM | POA: Diagnosis not present

## 2018-01-17 DIAGNOSIS — F17210 Nicotine dependence, cigarettes, uncomplicated: Secondary | ICD-10-CM | POA: Diagnosis not present

## 2018-01-17 DIAGNOSIS — R440 Auditory hallucinations: Secondary | ICD-10-CM | POA: Diagnosis not present

## 2018-01-17 DIAGNOSIS — B182 Chronic viral hepatitis C: Secondary | ICD-10-CM | POA: Diagnosis not present

## 2018-01-17 DIAGNOSIS — I38 Endocarditis, valve unspecified: Secondary | ICD-10-CM | POA: Diagnosis not present

## 2018-01-17 DIAGNOSIS — K226 Gastro-esophageal laceration-hemorrhage syndrome: Secondary | ICD-10-CM | POA: Diagnosis present

## 2018-01-17 DIAGNOSIS — G4700 Insomnia, unspecified: Secondary | ICD-10-CM | POA: Diagnosis not present

## 2018-01-17 DIAGNOSIS — J9811 Atelectasis: Secondary | ICD-10-CM | POA: Diagnosis not present

## 2018-01-17 DIAGNOSIS — E119 Type 2 diabetes mellitus without complications: Secondary | ICD-10-CM | POA: Diagnosis not present

## 2018-01-17 DIAGNOSIS — F0391 Unspecified dementia with behavioral disturbance: Secondary | ICD-10-CM | POA: Diagnosis not present

## 2018-01-17 DIAGNOSIS — I5032 Chronic diastolic (congestive) heart failure: Secondary | ICD-10-CM | POA: Diagnosis not present

## 2018-01-17 DIAGNOSIS — D62 Acute posthemorrhagic anemia: Secondary | ICD-10-CM | POA: Diagnosis not present

## 2018-01-17 DIAGNOSIS — Z7901 Long term (current) use of anticoagulants: Secondary | ICD-10-CM | POA: Diagnosis not present

## 2018-01-17 DIAGNOSIS — K210 Gastro-esophageal reflux disease with esophagitis: Secondary | ICD-10-CM | POA: Diagnosis not present

## 2018-01-17 DIAGNOSIS — J690 Pneumonitis due to inhalation of food and vomit: Secondary | ICD-10-CM | POA: Diagnosis not present

## 2018-01-17 DIAGNOSIS — G9341 Metabolic encephalopathy: Secondary | ICD-10-CM | POA: Diagnosis not present

## 2018-01-17 DIAGNOSIS — Z452 Encounter for adjustment and management of vascular access device: Secondary | ICD-10-CM | POA: Diagnosis not present

## 2018-01-17 DIAGNOSIS — R4182 Altered mental status, unspecified: Secondary | ICD-10-CM | POA: Diagnosis not present

## 2018-01-17 DIAGNOSIS — E1151 Type 2 diabetes mellitus with diabetic peripheral angiopathy without gangrene: Secondary | ICD-10-CM | POA: Diagnosis present

## 2018-01-17 DIAGNOSIS — J449 Chronic obstructive pulmonary disease, unspecified: Secondary | ICD-10-CM | POA: Diagnosis present

## 2018-01-17 DIAGNOSIS — J9621 Acute and chronic respiratory failure with hypoxia: Secondary | ICD-10-CM | POA: Diagnosis not present

## 2018-01-17 DIAGNOSIS — K7460 Unspecified cirrhosis of liver: Secondary | ICD-10-CM | POA: Diagnosis not present

## 2018-01-17 DIAGNOSIS — K2970 Gastritis, unspecified, without bleeding: Secondary | ICD-10-CM | POA: Diagnosis not present

## 2018-01-17 DIAGNOSIS — R140 Abdominal distension (gaseous): Secondary | ICD-10-CM | POA: Diagnosis not present

## 2018-01-17 DIAGNOSIS — N179 Acute kidney failure, unspecified: Secondary | ICD-10-CM | POA: Diagnosis not present

## 2018-01-17 DIAGNOSIS — Z9889 Other specified postprocedural states: Secondary | ICD-10-CM | POA: Diagnosis not present

## 2018-01-17 DIAGNOSIS — K2971 Gastritis, unspecified, with bleeding: Secondary | ICD-10-CM | POA: Diagnosis not present

## 2018-01-17 DIAGNOSIS — G9389 Other specified disorders of brain: Secondary | ICD-10-CM | POA: Diagnosis present

## 2018-01-17 DIAGNOSIS — K625 Hemorrhage of anus and rectum: Secondary | ICD-10-CM | POA: Diagnosis not present

## 2018-01-17 DIAGNOSIS — L959 Vasculitis limited to the skin, unspecified: Secondary | ICD-10-CM | POA: Diagnosis not present

## 2018-01-17 DIAGNOSIS — D689 Coagulation defect, unspecified: Secondary | ICD-10-CM | POA: Diagnosis present

## 2018-01-17 DIAGNOSIS — D649 Anemia, unspecified: Secondary | ICD-10-CM | POA: Diagnosis not present

## 2018-01-17 DIAGNOSIS — D692 Other nonthrombocytopenic purpura: Secondary | ICD-10-CM | POA: Diagnosis not present

## 2018-01-17 DIAGNOSIS — K661 Hemoperitoneum: Secondary | ICD-10-CM | POA: Diagnosis not present

## 2018-01-17 DIAGNOSIS — N183 Chronic kidney disease, stage 3 (moderate): Secondary | ICD-10-CM | POA: Diagnosis not present

## 2018-01-17 DIAGNOSIS — E785 Hyperlipidemia, unspecified: Secondary | ICD-10-CM | POA: Diagnosis not present

## 2018-01-17 DIAGNOSIS — D891 Cryoglobulinemia: Secondary | ICD-10-CM | POA: Diagnosis not present

## 2018-01-17 DIAGNOSIS — R1084 Generalized abdominal pain: Secondary | ICD-10-CM | POA: Diagnosis not present

## 2018-01-17 DIAGNOSIS — R6521 Severe sepsis with septic shock: Secondary | ICD-10-CM | POA: Diagnosis not present

## 2018-01-17 DIAGNOSIS — K921 Melena: Secondary | ICD-10-CM | POA: Diagnosis not present

## 2018-01-17 DIAGNOSIS — J9600 Acute respiratory failure, unspecified whether with hypoxia or hypercapnia: Secondary | ICD-10-CM | POA: Diagnosis not present

## 2018-01-17 DIAGNOSIS — K920 Hematemesis: Secondary | ICD-10-CM | POA: Diagnosis not present

## 2018-01-17 DIAGNOSIS — Z66 Do not resuscitate: Secondary | ICD-10-CM | POA: Diagnosis not present

## 2018-01-17 DIAGNOSIS — M310 Hypersensitivity angiitis: Secondary | ICD-10-CM | POA: Diagnosis not present

## 2018-01-17 DIAGNOSIS — I469 Cardiac arrest, cause unspecified: Secondary | ICD-10-CM | POA: Diagnosis not present

## 2018-01-17 DIAGNOSIS — K648 Other hemorrhoids: Secondary | ICD-10-CM | POA: Diagnosis not present

## 2018-01-17 LAB — CBC
HCT: 26.7 % — ABNORMAL LOW (ref 39.0–52.0)
Hemoglobin: 8.5 g/dL — ABNORMAL LOW (ref 13.0–17.0)
MCH: 25.5 pg — ABNORMAL LOW (ref 26.0–34.0)
MCHC: 31.8 g/dL (ref 30.0–36.0)
MCV: 80.2 fL (ref 78.0–100.0)
Platelets: 179 10*3/uL (ref 150–400)
RBC: 3.33 MIL/uL — ABNORMAL LOW (ref 4.22–5.81)
RDW: 14.4 % (ref 11.5–15.5)
WBC: 8.6 10*3/uL (ref 4.0–10.5)

## 2018-01-17 LAB — HEMOGLOBIN AND HEMATOCRIT, BLOOD
HCT: 21.3 % — ABNORMAL LOW (ref 39.0–52.0)
Hemoglobin: 6.7 g/dL — CL (ref 13.0–17.0)

## 2018-01-17 LAB — PROTIME-INR
INR: 1.79
Prothrombin Time: 20.6 seconds — ABNORMAL HIGH (ref 11.4–15.2)

## 2018-01-17 LAB — GLUCOSE, CAPILLARY
Glucose-Capillary: 158 mg/dL — ABNORMAL HIGH (ref 70–99)
Glucose-Capillary: 156 mg/dL — ABNORMAL HIGH (ref 70–99)
Glucose-Capillary: 173 mg/dL — ABNORMAL HIGH (ref 70–99)
Glucose-Capillary: 189 mg/dL — ABNORMAL HIGH (ref 70–99)
Glucose-Capillary: 195 mg/dL — ABNORMAL HIGH (ref 70–99)

## 2018-01-17 LAB — PREPARE RBC (CROSSMATCH)

## 2018-01-17 MED ORDER — LORAZEPAM 2 MG/ML IJ SOLN
1.0000 mg | Freq: Once | INTRAMUSCULAR | Status: AC
  Administered 2018-01-17: 1 mg via INTRAVENOUS
  Filled 2018-01-17: qty 1

## 2018-01-17 MED ORDER — PANTOPRAZOLE SODIUM 40 MG IV SOLR
40.0000 mg | Freq: Two times a day (BID) | INTRAVENOUS | Status: DC
  Administered 2018-01-17 – 2018-01-19 (×4): 40 mg via INTRAVENOUS
  Filled 2018-01-17 (×5): qty 40

## 2018-01-17 MED ORDER — INSULIN GLARGINE 100 UNIT/ML ~~LOC~~ SOLN
15.0000 [IU] | Freq: Every day | SUBCUTANEOUS | Status: DC
  Administered 2018-01-18 – 2018-01-26 (×10): 15 [IU] via SUBCUTANEOUS
  Filled 2018-01-17 (×11): qty 0.15

## 2018-01-17 MED ORDER — ALBUTEROL SULFATE (2.5 MG/3ML) 0.083% IN NEBU
2.5000 mg | INHALATION_SOLUTION | Freq: Four times a day (QID) | RESPIRATORY_TRACT | Status: DC | PRN
  Administered 2018-01-17 – 2018-01-26 (×2): 2.5 mg via RESPIRATORY_TRACT
  Filled 2018-01-17 (×2): qty 3

## 2018-01-17 MED ORDER — ONDANSETRON HCL 4 MG/2ML IJ SOLN
4.0000 mg | Freq: Four times a day (QID) | INTRAMUSCULAR | Status: DC | PRN
  Administered 2018-01-17: 4 mg via INTRAVENOUS
  Filled 2018-01-17: qty 2

## 2018-01-17 MED ORDER — ALBUTEROL SULFATE HFA 108 (90 BASE) MCG/ACT IN AERS: 2.0000 | INHALATION_SPRAY | Freq: Four times a day (QID) | RESPIRATORY_TRACT | Status: DC | PRN

## 2018-01-17 MED ORDER — HEPARIN (PORCINE) IN NACL 100-0.45 UNIT/ML-% IJ SOLN
1250.0000 [IU]/h | INTRAMUSCULAR | Status: DC
  Administered 2018-01-17: 1250 [IU]/h via INTRAVENOUS
  Filled 2018-01-17: qty 250

## 2018-01-17 MED ORDER — SODIUM CHLORIDE 0.9 % IV BOLUS
500.0000 mL | Freq: Once | INTRAVENOUS | Status: AC | PRN
  Administered 2018-01-17: 500 mL via INTRAVENOUS

## 2018-01-17 MED ORDER — SODIUM CHLORIDE 0.9 % IV SOLN
1.0000 g | INTRAVENOUS | Status: DC
  Administered 2018-01-17: 1 g via INTRAVENOUS
  Filled 2018-01-17 (×2): qty 10

## 2018-01-17 MED ORDER — WARFARIN SODIUM 7.5 MG PO TABS
7.5000 mg | ORAL_TABLET | Freq: Once | ORAL | Status: DC
  Filled 2018-01-17: qty 1

## 2018-01-17 MED ORDER — MIRTAZAPINE 15 MG PO TABS
30.0000 mg | ORAL_TABLET | Freq: Every day | ORAL | Status: DC
  Administered 2018-01-17 – 2018-01-21 (×5): 30 mg via ORAL
  Filled 2018-01-17 (×5): qty 2

## 2018-01-17 MED ORDER — TRAZODONE HCL 100 MG PO TABS
200.0000 mg | ORAL_TABLET | Freq: Every day | ORAL | Status: DC
  Administered 2018-01-17 (×2): 200 mg via ORAL
  Filled 2018-01-17 (×2): qty 2

## 2018-01-17 MED ORDER — SODIUM CHLORIDE 0.9 % IV SOLN
50.0000 ug/h | INTRAVENOUS | Status: DC
  Administered 2018-01-17 – 2018-01-18 (×2): 50 ug/h via INTRAVENOUS
  Filled 2018-01-17 (×4): qty 1

## 2018-01-17 NOTE — Significant Event (Addendum)
Rapid Response Event Note  Overview: Time Called: 1530 Arrival Time: 1535 Event Type: Other (Comment), Hypotension  Initial Focused Assessment: Per Rn patient vomited coffee ground/maroon emesis. Upon my arrival patient complaining of nausea and upper abdominal pain. BP 68/57  HR 93  Patient alert and actively trying to get comfortable.  Interventions: 500cc ns bolus BP 117/61  HR 78 Resident at bedside to assess patient.   Plan Labs, Gi consult, PPI Dc coumadin and start heparin gtt  Plan of Care (if not transferred):  Event Summary: Name of Physician Notified: Helberg at 1530  Plan admit to SDU, pt is currently in observation status.    Event End Time: 1620  Raliegh Ip

## 2018-01-17 NOTE — Progress Notes (Signed)
ANTICOAGULATION CONSULT NOTE  Pharmacy Consult:  Coumadin Indication: Mechanical Valve  Allergies  Allergen Reactions  . Morphine And Related Other (See Comments)    Combative     Patient Measurements: Height: 6' (182.9 cm) Weight: 277 lb (125.6 kg) IBW/kg (Calculated) : 77.6  Vital Signs: Temp: 98.1 F (36.7 C) (10/05 1316) Temp Source: Oral (10/05 1316) BP: 96/37 (10/05 1316) Pulse Rate: 82 (10/05 1316)  Labs: Recent Labs    01/15/18 1035  01/16/18 0550 01/16/18 1705 01/17/18 0537  HGB 8.1*   < > 9.3* 8.7* 8.5*  HCT 26.0*   < > 28.6* 27.6* 26.7*  PLT 163   < > 189 170 179  LABPROT 28.9*  --  25.0*  --  20.6*  INR 2.76  --  2.28  --  1.79  CREATININE 1.76*  --  1.70*  --   --    < > = values in this interval not displayed.    Estimated Creatinine Clearance: 57.7 mL/min (A) (by C-G formula based on SCr of 1.7 mg/dL (H)).    Assessment: 72 YOM presented with rectal pain and low hemoglobin on chronic Coumadin for mechanical heart valve.  Pharmacy consulted to continue Coumadin.  Some rectal bleeding noted but seems to be from hemorrhoids.  INR sub-therapeutic today.  No overt bleeding documented.   Home Coumadin dose: 5mg  daily except 7.5mg  on Thurs/Sun    Goal of Therapy:  INR 2-3 Monitor platelets by anticoagulation protocol: Yes    Plan:  Coumadin 7.5mg  PO today Daily PT / INR Consider bridging with Lovenox if INR remains sub-therapeutic Monitor closely for bleeding   Temiloluwa Laredo D. Mina Marble, PharmD, BCPS, [place] 01/17/2018, 1:23 PM

## 2018-01-17 NOTE — Discharge Instructions (Addendum)
Information on my medicine - Coumadin®   (Warfarin) ° °This medication education was reviewed with me or my healthcare representative as part of my discharge preparation. ° °Why was Coumadin prescribed for you? °Coumadin was prescribed for you because you have a blood clot or a medical condition that can cause an increased risk of forming blood clots. Blood clots can cause serious health problems by blocking the flow of blood to the heart, lung, or brain. Coumadin can prevent harmful blood clots from forming. °As a reminder your indication for Coumadin is:   Blood Clot Prevention After Heart Valve Surgery ° °What test will check on my response to Coumadin? °While on Coumadin (warfarin) you will need to have an INR test regularly to ensure that your dose is keeping you in the desired range. The INR (international normalized ratio) number is calculated from the result of the laboratory test called prothrombin time (PT). ° °If an INR APPOINTMENT HAS NOT ALREADY BEEN MADE FOR YOU please schedule an appointment to have this lab work done by your health care provider within 7 days. °Your INR goal is usually a number between:  2 to 3 or your provider may give you a more narrow range like 2-2.5.  Ask your health care provider during an office visit what your goal INR is. ° °What  do you need to  know  About  COUMADIN? °Take Coumadin (warfarin) exactly as prescribed by your healthcare provider about the same time each day.  DO NOT stop taking without talking to the doctor who prescribed the medication.  Stopping without other blood clot prevention medication to take the place of Coumadin may increase your risk of developing a new clot or stroke.  Get refills before you run out. ° °What do you do if you miss a dose? °If you miss a dose, take it as soon as you remember on the same day then continue your regularly scheduled regimen the next day.  Do not take two doses of Coumadin at the same time. ° °Important Safety  Information °A possible side effect of Coumadin (Warfarin) is an increased risk of bleeding. You should call your healthcare provider right away if you experience any of the following: °? Bleeding from an injury or your nose that does not stop. °? Unusual colored urine (red or dark brown) or unusual colored stools (red or black). °? Unusual bruising for unknown reasons. °? A serious fall or if you hit your head (even if there is no bleeding). ° °Some foods or medicines interact with Coumadin® (warfarin) and might alter your response to warfarin. To help avoid this: °? Eat a balanced diet, maintaining a consistent amount of Vitamin K. °? Notify your provider about major diet changes you plan to make. °? Avoid alcohol or limit your intake to 1 drink for women and 2 drinks for men per day. °(1 drink is 5 oz. wine, 12 oz. beer, or 1.5 oz. liquor.) ° °Make sure that ANY health care provider who prescribes medication for you knows that you are taking Coumadin (warfarin).  Also make sure the healthcare provider who is monitoring your Coumadin knows when you have started a new medication including herbals and non-prescription products. ° °Coumadin® (Warfarin)  Major Drug Interactions  °Increased Warfarin Effect Decreased Warfarin Effect  °Alcohol (large quantities) °Antibiotics (esp. Septra/Bactrim, Flagyl, Cipro) °Amiodarone (Cordarone) °Aspirin (ASA) °Cimetidine (Tagamet) °Megestrol (Megace) °NSAIDs (ibuprofen, naproxen, etc.) °Piroxicam (Feldene) °Propafenone (Rythmol SR) °Propranolol (Inderal) °Isoniazid (INH) °Posaconazole (Noxafil) Barbiturates (Phenobarbital) °Carbamazepine (  Carbamazepine (Tegretol) Chlordiazepoxide (Librium) Cholestyramine (Questran) Griseofulvin Oral Contraceptives Rifampin Sucralfate (Carafate) Vitamin K   Coumadin (Warfarin) Major Herbal Interactions  Increased Warfarin Effect Decreased Warfarin Effect  Garlic Ginseng Ginkgo biloba Coenzyme Q10 Green tea St. Johns wort    Coumadin (Warfarin)  FOOD Interactions  Eat a consistent number of servings per week of foods HIGH in Vitamin K (1 serving =  cup)  Collards (cooked, or boiled & drained) Kale (cooked, or boiled & drained) Mustard greens (cooked, or boiled & drained) Parsley *serving size only =  cup Spinach (cooked, or boiled & drained) Swiss chard (cooked, or boiled & drained) Turnip greens (cooked, or boiled & drained)  Eat a consistent number of servings per week of foods MEDIUM-HIGH in Vitamin K (1 serving = 1 cup)  Asparagus (cooked, or boiled & drained) Broccoli (cooked, boiled & drained, or raw & chopped) Brussel sprouts (cooked, or boiled & drained) *serving size only =  cup Lettuce, raw (green leaf, endive, romaine) Spinach, raw Turnip greens, raw & chopped   These websites have more information on Coumadin (warfarin):  FailFactory.se; VeganReport.com.au;  Thanks for allowing [place] to taking care of you at [facility]. Please take prescribed mediations as instructed and continue your care with physicians at [facility]. If having any questions or concern, please call us at [phone]. Thanks Dr. Linna Hoff

## 2018-01-17 NOTE — Progress Notes (Addendum)
Paged about anxiety, insomnia and inappropriate behavior. Was restarted on home trazodone earlier in the evening. Told that patient was walking into other patient's rooms due to confusion. Re-evaluated patient at bedside. Complaining of 'everything is wrong and I feel so anxious but I can't describe why' Also endorsing dyspnea and needing to rest after walking down the hallway. States he just wants to go to bed and fall sleep but he cannot because of the anxiety. Tried to re-direct and calm him down multiple times but did not respond. Cannot tolerate sitting down for more than 5 minutes before having to stand up and walk around again. Denies any significant rectal pain or irritation. Denis any SI/HI/AH/VH. No prior history of benzodiazepine use on home med list or database.   BP rechecked: 116/52.  Gen: Appears agitated and anxious EENT: MMM, EOMI, PERRL Neck: no JVD CV: RRR, Loud S1, No rales, No murmur, gallops Lungs: CTAB, No rales, wheezing Extremities: Trace pitting edema bilateral lower extremities Skin: Rash unchanged from earlier tonight  - Check CBC - Stop fluids - Ordered home rescue inhaler/nebulizer - Bedside sitter - Resume home mirtazapine - 1 time 1mg  dose Ativan to assess response for anxiety

## 2018-01-17 NOTE — Plan of Care (Signed)
  Problem: Activity: Goal: Risk for activity intolerance will decrease Outcome: Progressing   Problem: Nutrition: Goal: Adequate nutrition will be maintained Outcome: Progressing   

## 2018-01-17 NOTE — Progress Notes (Addendum)
ANTICOAGULATION CONSULT NOTE  Pharmacy Consult:  Coumadin >> Heparin Indication: Mechanical Valve  Allergies  Allergen Reactions  . Morphine And Related Other (See Comments)    Combative     Patient Measurements: Height: 6' (182.9 cm) Weight: 277 lb (125.6 kg) IBW/kg (Calculated) : 77.6  Vital Signs: Temp: 97.7 F (36.5 C) (10/05 1543) Temp Source: Oral (10/05 1543) BP: 91/51 (10/05 1618) Pulse Rate: 76 (10/05 1618)  Labs: Recent Labs    01/15/18 1035  01/16/18 0550 01/16/18 1705 01/17/18 0537  HGB 8.1*   < > 9.3* 8.7* 8.5*  HCT 26.0*   < > 28.6* 27.6* 26.7*  PLT 163   < > 189 170 179  LABPROT 28.9*  --  25.0*  --  20.6*  INR 2.76  --  2.28  --  1.79  CREATININE 1.76*  --  1.70*  --   --    < > = values in this interval not displayed.    Estimated Creatinine Clearance: 57.7 mL/min (A) (by C-G formula based on SCr of 1.7 mg/dL (H)).    Assessment: 47 YOM presented with rectal pain and low hemoglobin on chronic Coumadin for mechanical heart valve.  Pharmacy consulted to continue Coumadin.  Some rectal bleeding noted but seems to be from hemorrhoids.  Pt now with rapid response event and concern for GI bleed. Pharmacy consulted to start IV heparin while holding warfarin. Will target low goal without bolus in setting of bleed. INR 1.7 this morning.  Home Coumadin dose: 5mg  daily except 7.5mg  on Thurs/Sun    Goal of Therapy:  Heparin Level 0.3-0.5 INR 2-3  Monitor platelets by anticoagulation protocol: Yes    Plan:  -Hold warfarin -Start IV heparin 1250 units/hr -Check 6hr heparin level -Watch INR trend to ensure it is not >2  Arrie Senate, PharmD, BCPS Clinical Pharmacist [phone] Please check AMION for all [place] numbers 01/17/2018

## 2018-01-17 NOTE — Progress Notes (Signed)
Patient vomited coffee ground emesis X3. MD and Rapid response were notified. Upon assessment, patient was hypotensive. Patient received a fluid bolus and new orders were placed to be implemented.Patient's daughter, Caryl Pina was notified of patient's change in status and updated on plan of care.

## 2018-01-17 NOTE — Progress Notes (Signed)
   Subjective: Patient resting comfortably upon arrival in the room. He did not get much sleep overnight but is feeling better this AM. He is able to tell me where he is and what occurred overnight. We discussed that we are still awaiting placement at a rehab facility. He voiced understanding.   Objective: Vital signs in last 24 hours: Vitals:   01/16/18 1316 01/16/18 1700 01/16/18 1946 01/17/18 0435  BP: (!) 109/53 (!) 88/48 (!) 83/56 (!) 116/52  Pulse: 66 70 73 75  Resp: 18  18 18   Temp: 97.6 F (36.4 C)  97.9 F (36.6 C) 98.5 F (36.9 C)  TempSrc: Oral  Oral Oral  SpO2: 99% 99% 99% 98%  Weight:      Height:       General: Obese male in no acute distress Pulm: Good air movement with no wheezing or crackles  CV: RRR, mechanical click  Abdomen: Soft, non-distended, no tenderness to palpation  Extremities: Palpable purpura on the bilateral LEs, worse compared to admission  Neuro: Alert and oriented x 3  Assessment/Plan:  [name] is a 67 y.o male with a history of AS s/p mechanical valve replacement on Warfarin, epilepsy, and DM who presented to the ED with worsening rectal pain and bleeding.   Encephalopathy - Patient unable to sleep overnight and appeared acutely confused. Night team assessed the patient on two occasions. He appeared to be very anxious and but was alert and oriented x 3. A 1:1 sitter was ordered and the patient was given a one time dose of Ativan.  - Possible this is related to anxiety but also 2/2 underlying cognitive impairment.  - HIV previously checked is normal  - May benefit for cognitive evaluation  - I will re-evaluate him this afternoon and determine need for continued sitter  Perianal and gluteal intertrigo  - Patient states that the pain is improved compared to prior  - Continuing clotrimazole cream BID  Lower GI Bleed Internal Hemorrhoids  - Patient's Hgb at 8.5 he is s/p 1 units pRBCs  - Continue hydrocortisone suppositories    Hepatitis C Palpable Purpura  - Patient with progression of his bilateral LE palpable purpura - Previous biopsy showing leukocytoclastic vasculitis  - Need to have his hepatitis treated which would help with the cryo + vasculitis  - Outpatient follow-up with ID  AS s/p mechanical valve replacement  - INR currently subtherapeutic  - Pharmacy managing   Dispo: Anticipated discharge pending SNF placement.   Adam Homes, MD 01/17/2018, 6:19 AM

## 2018-01-17 NOTE — Progress Notes (Signed)
Medicine attending: Clinical status and medical database reviewed in detail with resident physician Dr. Ina Homes and I concur with his evaluation and management plan.  67 year old man admitted on October 3 with lower GI bleeding felt to be secondary to hemorrhoids.  Situation complicated by need for chronic anticoagulation in view of a mechanical aortic valve.  In addition he has coronary artery disease status post bypass surgery in July of this year.  Obstructive sleep apnea not using CPAP. He has a history of  hepatitis C remotely treated with interferon based therapy which was not successful.  Recently reevaluated by infectious disease but not yet started on contemporary treatment.  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 Hemoglobin on admission 8.1.  He received 1 unit of blood cells.  Hemoglobin peaked at 9.3 and slowly drifted down but was stable  this morning at 8.5. Discharge plans were in progress when he suddenly developed hematemesis this afternoon.  Hemoglobin 6.7.  Fall in his blood pressure 268/57.  Resuscitated with normal saline.  Current pressure 91/51.  Octreotide infusion and parenteral PPI.  Prophylactic ceftriaxone.  Transfuse blood.  GI contacted and will see the patient.  He will need upper endoscopy at this point. INR this morning 1.79.  We will hold his warfarin.  There is usually a 2 to 3-day window with a mechanical aortic valve we we can safely hold anticoagulation.  We will monitor his status closely.  Decide on further anticoagulation based on EGD findings.  Use temporary unfractionated heparin for ease of reversal if needed.

## 2018-01-17 NOTE — Progress Notes (Signed)
CRITICAL VALUE ALERT  Critical Value: hemoglobin 6.7  Date & Time Notied:  01/17/2018  1720  Provider Notified: Helberg  Orders Received/Actions taken: Orders placed for transfusion

## 2018-01-17 NOTE — Progress Notes (Signed)
Paged that patient was experiencing hematemesis. Presented to the bedside for further evaluation. He denies abdominal pain but previously was endorsing epigastric. Shortly after lunch the patient had abrupt hematemesis and became hypotensive. Rapid response was called. 541mL fluid bolus given. Upon entering the room the patient is moaning but denies pain. His abdomen appears more distended. He was not vomiting prior the onset of his hematemesis.  Per chart review the patient has hepatitis C with recent fibroscan showing severe cirrhosis.   Plan:  Manage as upper GI bleed with cirrhosis  - Start PPI BID  - Start octreotide  - Start ceftriaxone for SBP prophylaxis  - NPO except sips with meds  - Abdominal film to assess increased abdominal distention, bedside review with with no air fluid levels or bowel distention noted.   - Repeat H&H stat, transfuse if <7  - Type and screen completed on admission  - Place and maintain 2 IVs  - Judicious with the IVF due to the patient's heart failure  - Transfer to inpatient status  - Consult GI, recommends stopping warfarin and starting heparin. Will require bridging once warfarin is restarted

## 2018-01-18 ENCOUNTER — Inpatient Hospital Stay (HOSPITAL_COMMUNITY): Payer: Medicare Other | Admitting: Anesthesiology

## 2018-01-18 ENCOUNTER — Encounter (HOSPITAL_COMMUNITY)
Admission: EM | Disposition: A | Discharge status: Nursing Home (Includes ICF) | Payer: Self-pay | Source: Home / Self Care | Attending: Internal Medicine

## 2018-01-18 ENCOUNTER — Encounter (HOSPITAL_COMMUNITY): Payer: Self-pay | Admitting: Gastroenterology

## 2018-01-18 DIAGNOSIS — K208 Other esophagitis: Secondary | ICD-10-CM

## 2018-01-18 DIAGNOSIS — G4700 Insomnia, unspecified: Secondary | ICD-10-CM

## 2018-01-18 DIAGNOSIS — F17210 Nicotine dependence, cigarettes, uncomplicated: Secondary | ICD-10-CM

## 2018-01-18 DIAGNOSIS — K2970 Gastritis, unspecified, without bleeding: Secondary | ICD-10-CM

## 2018-01-18 DIAGNOSIS — K920 Hematemesis: Secondary | ICD-10-CM

## 2018-01-18 DIAGNOSIS — K7460 Unspecified cirrhosis of liver: Secondary | ICD-10-CM

## 2018-01-18 DIAGNOSIS — K921 Melena: Secondary | ICD-10-CM

## 2018-01-18 DIAGNOSIS — K922 Gastrointestinal hemorrhage, unspecified: Secondary | ICD-10-CM

## 2018-01-18 DIAGNOSIS — Z952 Presence of prosthetic heart valve: Secondary | ICD-10-CM

## 2018-01-18 HISTORY — PX: ESOPHAGOGASTRODUODENOSCOPY (EGD) WITH PROPOFOL: SHX5813

## 2018-01-18 LAB — CBC
HCT: 20.7 % — ABNORMAL LOW (ref 39.0–52.0)
HCT: 22.8 % — ABNORMAL LOW (ref 39.0–52.0)
Hemoglobin: 6.6 g/dL — CL (ref 13.0–17.0)
Hemoglobin: 7.3 g/dL — ABNORMAL LOW (ref 13.0–17.0)
MCH: 26.9 pg (ref 26.0–34.0)
MCH: 27.1 pg (ref 26.0–34.0)
MCHC: 31.9 g/dL (ref 30.0–36.0)
MCHC: 32 g/dL (ref 30.0–36.0)
MCV: 84.5 fL (ref 78.0–100.0)
MCV: 84.8 fL (ref 78.0–100.0)
Platelets: 199 10*3/uL (ref 150–400)
Platelets: 183 10*3/uL (ref 150–400)
RBC: 2.45 MIL/uL — ABNORMAL LOW (ref 4.22–5.81)
RBC: 2.69 MIL/uL — ABNORMAL LOW (ref 4.22–5.81)
RDW: 14.4 % (ref 11.5–15.5)
RDW: 14.6 % (ref 11.5–15.5)
WBC: 14.6 10*3/uL — ABNORMAL HIGH (ref 4.0–10.5)
WBC: 17.2 10*3/uL — ABNORMAL HIGH (ref 4.0–10.5)

## 2018-01-18 LAB — HEMOGLOBIN AND HEMATOCRIT, BLOOD
HCT: 21.5 % — ABNORMAL LOW (ref 39.0–52.0)
Hemoglobin: 6.8 g/dL — CL (ref 13.0–17.0)

## 2018-01-18 LAB — GLUCOSE, CAPILLARY
Glucose-Capillary: 198 mg/dL — ABNORMAL HIGH (ref 70–99)
Glucose-Capillary: 193 mg/dL — ABNORMAL HIGH (ref 70–99)
Glucose-Capillary: 143 mg/dL — ABNORMAL HIGH (ref 70–99)
Glucose-Capillary: 143 mg/dL — ABNORMAL HIGH (ref 70–99)
Glucose-Capillary: 149 mg/dL — ABNORMAL HIGH (ref 70–99)

## 2018-01-18 LAB — PREPARE RBC (CROSSMATCH)

## 2018-01-18 LAB — HEPARIN LEVEL (UNFRACTIONATED): Heparin Unfractionated: 0.22 IU/mL — ABNORMAL LOW (ref 0.30–0.70)

## 2018-01-18 LAB — PROTIME-INR
INR: 2.47
Prothrombin Time: 26.5 seconds — ABNORMAL HIGH (ref 11.4–15.2)

## 2018-01-18 SURGERY — ESOPHAGOGASTRODUODENOSCOPY (EGD) WITH PROPOFOL
Anesthesia: Monitor Anesthesia Care

## 2018-01-18 MED ORDER — LACTATED RINGERS IV SOLN
INTRAVENOUS | Status: DC | PRN
  Administered 2018-01-18: 11:00:00 via INTRAVENOUS

## 2018-01-18 MED ORDER — QUETIAPINE FUMARATE 100 MG PO TABS
100.0000 mg | ORAL_TABLET | Freq: Every day | ORAL | Status: DC
  Administered 2018-01-18 – 2018-01-21 (×4): 100 mg via ORAL
  Filled 2018-01-18 (×4): qty 4

## 2018-01-18 MED ORDER — SODIUM CHLORIDE 0.9 % IV SOLN: INTRAVENOUS | Status: DC

## 2018-01-18 MED ORDER — LORAZEPAM 2 MG/ML IJ SOLN
1.0000 mg | Freq: Once | INTRAMUSCULAR | Status: AC
  Administered 2018-01-18: 1 mg via INTRAVENOUS
  Filled 2018-01-18: qty 1

## 2018-01-18 MED ORDER — LIDOCAINE HCL (CARDIAC) PF 100 MG/5ML IV SOSY
PREFILLED_SYRINGE | INTRAVENOUS | Status: DC | PRN
  Administered 2018-01-18: 20 mg via INTRATRACHEAL

## 2018-01-18 MED ORDER — SODIUM CHLORIDE 0.9% IV SOLUTION: Freq: Once | INTRAVENOUS | Status: DC

## 2018-01-18 MED ORDER — HALOPERIDOL LACTATE 5 MG/ML IJ SOLN
2.0000 mg | Freq: Four times a day (QID) | INTRAMUSCULAR | Status: DC | PRN
  Administered 2018-01-19 – 2018-01-20 (×4): 2 mg via INTRAMUSCULAR
  Filled 2018-01-18 (×5): qty 1

## 2018-01-18 MED ORDER — SODIUM CHLORIDE 0.9% IV SOLUTION
Freq: Once | INTRAVENOUS | Status: AC
  Administered 2018-01-18: 16:00:00 via INTRAVENOUS

## 2018-01-18 MED ORDER — LACTATED RINGERS IV SOLN: INTRAVENOUS | Status: DC

## 2018-01-18 MED ORDER — WITCH HAZEL-GLYCERIN EX PADS
MEDICATED_PAD | CUTANEOUS | Status: DC | PRN
  Filled 2018-01-18: qty 100

## 2018-01-18 MED ORDER — PROPOFOL 10 MG/ML IV BOLUS
INTRAVENOUS | Status: DC | PRN
  Administered 2018-01-18 (×2): 20 mg via INTRAVENOUS

## 2018-01-18 MED ORDER — HALOPERIDOL 2 MG PO TABS
2.0000 mg | ORAL_TABLET | Freq: Four times a day (QID) | ORAL | Status: DC | PRN
  Administered 2018-01-19 – 2018-01-20 (×2): 2 mg via ORAL
  Filled 2018-01-18: qty 2
  Filled 2018-01-18: qty 1
  Filled 2018-01-18: qty 2

## 2018-01-18 SURGICAL SUPPLY — 15 items

## 2018-01-18 NOTE — Progress Notes (Addendum)
Paged about combative behavior. Examined and evaluated at bedside. Appears somnolent and observed intermittently trying to get out of bed. On soft restraints per day team. AAOx1 to name only. Unable to follow directions. Unable to answer questions in detail.  Complaining of pain but unable to localize or describe quality of pain. Worsening mentation compared to prior examinations. Per nursing staff, patient pulled out 4 IVs and cardiac monitor and was observed with possible visual/auditory hallucinations: was shouting, 'Get away from her. Get her away from here.' He also attempted to punch nursing staff. All these events occurred before patient began to receive blood transfusion. Of note, patient had upper endoscopy earlier today with altered mental status after being transferred back to the floor.  Vitals:   01/18/18 1817 01/18/18 1850  BP:  (!) 113/46  Pulse:  82  Resp:  20  Temp: 98.1 F (36.7 C) 98 F (36.7 C)  SpO2:  98%   General: Somnolent male with intermittent agitation HEENT: MMM, EOMI, PERRL CV: RRR, Loud s2, no murmurs Pulm: CTAB, no rales, no wheeezing Abd: Soft, BS+, non tender to palpation Extremities: No pitting edema, peripheral pulses present Skin: Vasculitic lesions unchanged from prior Neuro: AAOx1, somnolent  Acute Encephalopathy - Most likely acute delirium on underlying dementia considering prior hx of memory decline and intraoperative procedure today.  - Was on ceftriaxone with last dose earlier today - unlikely to be infectious etiology although continuing to have worsening leukocytosis (14.6->17.3) - Started Haldol 2mg  q6hr as per psych - On Seroquel 100mg  daily per psych - C/w sitter & soft restraints

## 2018-01-18 NOTE — Anesthesia Preprocedure Evaluation (Addendum)
Anesthesia Evaluation  Patient identified by MRN, date of birth, ID band Patient awake    Reviewed: Allergy & Precautions, NPO status , Patient's Chart, lab work & pertinent test results  History of Anesthesia Complications Negative for: history of anesthetic complications  Airway Mallampati: II  TM Distance: >3 FB Neck ROM: Full    Dental  (+) Dental Advisory Given   Pulmonary sleep apnea , COPD, Current Smoker,    breath sounds clear to auscultation       Cardiovascular + angina + Past MI and +CHF   Rhythm:Regular     Neuro/Psych  Headaches, Seizures -,  PSYCHIATRIC DISORDERS Dementia    GI/Hepatic negative GI ROS, (+) Hepatitis -  Endo/Other  diabetesMorbid obesity  Renal/GU      Musculoskeletal  (+) Arthritis ,   Abdominal   Peds  Hematology  (+) anemia ,   Anesthesia Other Findings 7/19 cath: Subtotal occlusion of the distal LAD, possibly thrombotic/embolic in origin.  Otherwise, mild nonobstructive coronary artery disease 7/19 tte: Left ventricle: The cavity size was normal. There was severe   hypertrophy of the basal septum with otherwise moderate   concentric LVH. Systolic function was normal. The estimated   ejection fraction was in the range of 55% to 60%. Wall motion was   normal; there were no regional wall motion abnormalities. Doppler   parameters are consistent with abnormal left ventricular   relaxation (grade 1 diastolic dysfunction). Doppler parameters   are consistent with indeterminate ventricular filling pressure. - Aortic valve: A bioprosthesis was present and functioning   properly. Valve area (VTI): 2.74 cm^2. Valve area (Vmax): 2.72   cm^2. Valve area (Vmean): 2.7 cm^2. - Mitral valve: Transvalvular velocity was within the normal range.   There was no evidence for stenosis. There was no regurgitation. - Left atrium: The atrium was moderately dilated. - Right ventricle: The cavity size  was normal. Wall thickness was   normal. Systolic function was normal. - Tricuspid valve: There was trivial regurgitation. - Pulmonary arteries: Systolic pressure was within the normal   range. PA peak pressure: 27 mm Hg (S).  Reproductive/Obstetrics                            Anesthesia Physical Anesthesia Plan  ASA: III  Anesthesia Plan: MAC   Post-op Pain Management:    Induction: Intravenous  PONV Risk Score and Plan: 0 and Treatment may vary due to age or medical condition  Airway Management Planned: Nasal Cannula  Additional Equipment:   Intra-op Plan:   Post-operative Plan:   Informed Consent: I have reviewed the patients History and Physical, chart, labs and discussed the procedure including the risks, benefits and alternatives for the proposed anesthesia with the patient or authorized representative who has indicated his/her understanding and acceptance.   Dental advisory given  Plan Discussed with: CRNA and Surgeon  Anesthesia Plan Comments:         Anesthesia Quick Evaluation

## 2018-01-18 NOTE — Transfer of Care (Signed)
Immediate Anesthesia Transfer of Care Note  Patient: Adam Savage  Procedure(s) Performed: ESOPHAGOGASTRODUODENOSCOPY (EGD) WITH PROPOFOL (N/A )  Patient Location: PACU and Endoscopy Unit  Anesthesia Type:MAC  Level of Consciousness: awake  Airway & Oxygen Therapy: Patient Spontanous Breathing and Patient connected to nasal cannula oxygen  Post-op Assessment: Report given to RN and Post -op Vital signs reviewed and stable  Post vital signs: Reviewed and stable  Last Vitals:  Vitals Value Taken Time  BP    Temp    Pulse 67 01/18/2018 11:10 AM  Resp 14 01/18/2018 11:10 AM  SpO2 100 % 01/18/2018 11:10 AM  Vitals shown include unvalidated device data.  Last Pain:  Vitals:   01/18/18 1038  TempSrc: Oral  PainSc: 0-No pain         Complications: No apparent anesthesia complications

## 2018-01-18 NOTE — Consult Note (Addendum)
Referring Provider: [facility] Primary Care Physician:  Patient, [name] Primary Gastroenterologist:  Althia Forts  Reason for Consultation: GI bleed    Attending physician's note   I have taken a history, examined the patient and reviewed the chart. I agree with the Advanced Practitioner's note, impression and recommendations.  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  He was being conservatively managed for possible hemorrhoidal bleeding. Hematemesis and melena since yesterday evening. Hemoglobin trended down to 6.7 from baseline of 9-10 Tachycardic and hypotensive with systolic ranging from 17-00 and diastolic 17C to 60  ?  Cirrhosis: Low albumin.  INR elevated due to anticoagulation with Coumadin.  Platelet count is normal and he had normal synthetic function with INR 1.1 in July 2019.  No recent imaging.  Seems less likely to have progressed to cirrhosis  Currently on octreotide GTT, Ceftriaxone and PPI Monitor hemoglobin every 6 hours and transfuse if less than 7 Abdominal ultrasound pending No asterixis or evidence of hepatic encephalopathy  Currently Coumadin on hold, started on heparin gtt yesterday.  Heparin gtt. on hold since last night by primary team  We will proceed with urgent EGD to evaluate and further recommendation based on findings.  The risks and benefits as well as alternatives of endoscopic procedure(s) have been discussed and reviewed. All questions answered. The patient agrees to proceed.     Damaris Hippo , MD [phone]    ASSESSMENT AND PLAN:    41. 67 yo male with rectal pain and bleeding on coumadin at home (on hold) and heparin gtt now.  Initially felt to be hemorrhoidal but he then apparently had an episode of hematemesis. Stool is black on exam today.  -patient  needs EGD this am. The procedure was explained to him, questions answered. Heparin infusion was stopped on night shift. INR currently at 2.47 -continue BID IV PPI - monitor CBC. He got another units of blood earlier this am for hgb of 6.6, -follow up hgb result not yet resulted.   2. Agitation / restlessness. Staff having some difficulty managing him. Language is foul he is demanding. No asterixis on exam.   3. Mechanical aortic valve. Coumadin on hold, on heparin gtt. INR 2.47 - heparin stopped on night shift   4. Hx of colon resection, etiology ?  5. HCV, followed by ID. Unknown whether he has chronic liver disease. No imaging available. INR elevated from coumadin most likely. Platelets normal. ID has ordered outpatient elastrography   HPI: Adam Savage is a 67 y.o. male with DM, OSA, HCV, mechanical aortic vale on coumadin, seizures. He presented to ED on 10/3 with rectal pain with bleeding. Pelvic CT scan was unremarkable. Perianal area was red and macerated. Prior to early Sept his baseline hgb was around 12, since then it has been in 9-10 range. During this admission it has fallen to 6.7. He has received 2 units of blood without any improvement in hgb. Just received another unit of blood this am. Patient belligerent, difficult to get a history.  Denies hx of PUD. No abdominal pain, just rectal pain.     Past Medical History:  Diagnosis Date  . COPD (chronic obstructive pulmonary disease) ([place])   . Diabetes mellitus without complication ([place])   . Mechanical heart valve present   . Pre-diabetes   . Seizures ([place])  Past Surgical History:  Procedure Laterality Date  . CARDIAC SURGERY    . CHOLECYSTECTOMY    . COLON SURGERY    . CORONARY/GRAFT ANGIOGRAPHY N/A 10/31/2017   Procedure: CORONARY/GRAFT ANGIOGRAPHY;  Surgeon: Nelva Bush, MD;  Location: [facility];  Service: Cardiovascular;  Laterality: N/A;  . JOINT REPLACEMENT    . KNEE SURGERY Left     Prior to  Admission medications   Medication Sig Start Date End Date Taking? Authorizing Provider  acetaminophen (TYLENOL) 650 MG CR tablet Take 1,300 mg by mouth every 8 (eight) hours as needed for pain.   Yes [provider]  albuterol (PROVENTIL HFA;VENTOLIN HFA) 108 (90 Base) MCG/ACT inhaler Inhale 2 puffs into the lungs every 6 (six) hours as needed for wheezing or shortness of breath. 12/25/17  Yes [name], MD  aspirin EC 81 MG tablet Take 81 mg by mouth daily.   Yes [provider]  atorvastatin (LIPITOR) 10 MG tablet Take 1 tablet (10 mg total) by mouth daily. 11/04/17 01/20/18 Yes Arrien, Jimmy Picket, MD  carvedilol (COREG) 6.25 MG tablet Take 1 tablet (6.25 mg total) by mouth 2 (two) times daily with a meal. 11/27/17  Yes Jettie Booze, MD  clotrimazole (LOTRIMIN) 1 % cream Apply 1 application topically 2 (two) times daily. For 2 weeks 01/14/18  Yes Velna Ochs, MD  furosemide (LASIX) 20 MG tablet Take 3 tablets (60 mg total) by mouth daily. 12/25/17 12/25/18 Yes [name], MD  hydrocortisone (ANUSOL-HC) 25 MG suppository Place 1 suppository (25 mg total) rectally 2 (two) times daily as needed for hemorrhoids or anal itching. 01/14/18 01/14/19 Yes Velna Ochs, MD  insulin detemir (LEVEMIR) 100 UNIT/ML injection Inject 12 Units into the skin daily.   Yes [provider]  levETIRAcetam (KEPPRA) 1000 MG tablet Take 1 tablet (1,000 mg total) by mouth 2 (two) times daily. 11/14/17  Yes Cameron Sprang, MD  lisinopril (PRINIVIL,ZESTRIL) 10 MG tablet Take 10 mg by mouth daily.   Yes [provider]  mirtazapine (REMERON) 30 MG tablet Take 30 mg by mouth at bedtime. 12/07/17  Yes [provider]  ramipril (ALTACE) 5 MG capsule Take 1 capsule (5 mg total) by mouth daily. 11/27/17  Yes Jettie Booze, MD  ranitidine (ZANTAC) 150 MG tablet Take 150 mg by mouth 2 (two) times daily.  05/01/17  Yes [provider]  tamsulosin (FLOMAX)  0.4 MG CAPS capsule Take 0.4 mg by mouth at bedtime.  05/01/17  Yes [provider]  traZODone (DESYREL) 100 MG tablet Take 200 mg by mouth at bedtime. 10/15/17  Yes [provider]  warfarin (COUMADIN) 5 MG tablet Take 5 mg by mouth See admin instructions. Take one tablet (5 mg) by mouth Monday, Tuesday, Wednesday, Friday, Saturday nights - 9pm 05/02/17  Yes [provider]  warfarin (COUMADIN) 7.5 MG tablet Take 7.5 mg by mouth See admin instructions. Take one tablet (7.5 mg) by mouth on Sunday and Thursday nights - 9pm (take 5 mg tablet on all other days of the week) 05/01/17  Yes [provider]  blood glucose meter kit and supplies Dispense based on patient and insurance preference. Use up to four times daily as directed. (FOR ICD-10 E10.9, E11.9). 11/04/17   Arrien, Jimmy Picket, MD  glucose blood test strip Use as instructed 01/13/18   [name], MD  Lancets MISC 1 Units by Does not apply route 4 (four) times daily -  with meals  and at bedtime. 01/13/18   [name], MD    Current Facility-Administered Medications  Medication Dose Route Frequency Provider Last Rate Last Dose  . 0.9 %  sodium chloride infusion (Manually program via Guardrails IV Fluids)   Intravenous Once Helberg, Larkin Ina, MD      . 0.9 %  sodium chloride infusion (Manually program via Guardrails IV Fluids)   Intravenous Once Asencion Noble, MD      . albuterol (PROVENTIL) (2.5 MG/3ML) 0.083% nebulizer solution 2.5 mg  2.5 mg Nebulization Q6H PRN Joni Reining C, DO   2.5 mg at 01/17/18 0524  . atorvastatin (LIPITOR) tablet 10 mg  10 mg Oral Daily Helberg, Larkin Ina, MD   10 mg at 01/18/18 0800  . carvedilol (COREG) tablet 6.25 mg  6.25 mg Oral BID WC Helberg, Justin, MD   6.25 mg at 01/18/18 0800  . cefTRIAXone (ROCEPHIN) 1 g in sodium chloride 0.9 % 100 mL IVPB  1 g Intravenous Q24H Helberg, Justin, MD 200 mL/hr at 01/17/18 1945 1 g at 01/17/18 1945  . clotrimazole (LOTRIMIN) 1 % cream 1  application  1 application Topical BID Ina Homes, MD   1 application at 94/50/38 0807  . hydrocortisone (ANUSOL-HC) suppository 25 mg  25 mg Rectal BID PRN Helberg, Larkin Ina, MD      . insulin aspart (novoLOG) injection 0-15 Units  0-15 Units Subcutaneous TID WC Ina Homes, MD   3 Units at 01/18/18 0758  . insulin aspart (novoLOG) injection 0-5 Units  0-5 Units Subcutaneous QHS Ina Homes, MD   3 Units at 01/16/18 2134  . insulin glargine (LANTUS) injection 15 Units  15 Units Subcutaneous QHS Ina Homes, MD   15 Units at 01/18/18 0025  . levETIRAcetam (KEPPRA) tablet 1,000 mg  1,000 mg Oral BID Ina Homes, MD   1,000 mg at 01/18/18 0800  . mirtazapine (REMERON) tablet 30 mg  30 mg Oral QHS [name], MD   30 mg at 01/17/18 2209  . nicotine (NICODERM CQ - dosed in mg/24 hr) patch 7 mg  7 mg Transdermal Daily [name], MD   7 mg at 01/18/18 0803  . octreotide (SANDOSTATIN) 500 mcg in sodium chloride 0.9 % 250 mL (2 mcg/mL) infusion  50 mcg/hr Intravenous Continuous Ina Homes, MD 25 mL/hr at 01/18/18 0646 50 mcg/hr at 01/18/18 0646  . ondansetron (ZOFRAN) injection 4 mg  4 mg Intravenous Q6H PRN Ina Homes, MD   4 mg at 01/17/18 1949  . oxyCODONE-acetaminophen (PERCOCET/ROXICET) 5-325 MG per tablet 1 tablet  1 tablet Oral Q6H PRN Alphonzo Grieve, MD   1 tablet at 01/18/18 0447  . pantoprazole (PROTONIX) injection 40 mg  40 mg Intravenous Q12H Ina Homes, MD   40 mg at 01/18/18 0801  . senna-docusate (Senokot-S) tablet 1 tablet  1 tablet Oral BID Ina Homes, MD   1 tablet at 01/17/18 2209  . tamsulosin (FLOMAX) capsule 0.4 mg  0.4 mg Oral QHS Helberg, Larkin Ina, MD   0.4 mg at 01/17/18 2209  . traZODone (DESYREL) tablet 200 mg  200 mg Oral QHS [name], MD   200 mg at 01/17/18 2209    Allergies as of 01/15/2018 - Review Complete 01/15/2018  Allergen Reaction Noted  . Morphine and related Other (See Comments) 12/21/2017    Family History    Problem Relation Age of Onset  . Hypertension Father     Social History   Socioeconomic History  . Marital status: Married  Spouse name: Not on file  . Number of children: Not on file  . Years of education: Not on file  . Highest education level: Not on file  Occupational History  . Not on file  Social Needs  . Financial resource strain: Not on file  . Food insecurity:    Worry: Not on file    Inability: Not on file  . Transportation needs:    Medical: Not on file    Non-medical: Not on file  Tobacco Use  . Smoking status: Current Every Day Smoker    Packs/day: 1.00    Types: Cigarettes  . Smokeless tobacco: Never Used  Substance and Sexual Activity  . Alcohol use: Not Currently  . Drug use: Never  . Sexual activity: Not on file  Lifestyle  . Physical activity:    Days per week: Not on file    Minutes per session: Not on file  . Stress: Not on file  Relationships  . Social connections:    Talks on phone: Not on file    Gets together: Not on file    Attends religious service: Not on file    Active member of club or organization: Not on file    Attends meetings of clubs or organizations: Not on file    Relationship status: Not on file  . Intimate partner violence:    Fear of current or ex partner: Not on file    Emotionally abused: Not on file    Physically abused: Not on file    Forced sexual activity: Not on file  Other Topics Concern  . Not on file  Social History Narrative  . Not on file    Review of Systems: All systems reviewed and negative except where noted in HPI.  Physical Exam: Vital signs in last 24 hours: Temp:  [97.4 F (36.3 C)-98.1 F (36.7 C)] 97.5 F (36.4 C) (10/06 0455) Pulse Rate:  [76-101] 92 (10/06 0455) Resp:  [15-22] 20 (10/06 0455) BP: (68-117)/(37-78) 91/78 (10/06 0455) SpO2:  [98 %-100 %] 100 % (10/06 0131) Last BM Date: 01/16/18 General:   Alert, obese male in NAD Psych:  Agitated, demanding.  Ears:  Normal auditory  acuity. Nose:  No deformity, discharge,  or lesions. Neck:  Supple; no masses Lungs:  Clear throughout to auscultation.   No wheezes, crackles, or rhonchi.  Heart:  Regular rate and rhythm Abdomen:  Soft,  nontender, BS active  Rectal:  Deferred  Msk:  Symmetrical without gross deformities. . Neurologic:  Alert and  oriented x4;  grossly normal neurologically.No asterixis Skin:  Intact without significant lesions or rashes..   Intake/Output from previous day: 10/05 0701 - 10/06 0700 In: 360 [P.O.:360] Out: -  Intake/Output this shift: Total I/O In: -  Out: 100 [Urine:100]  Lab Results: Recent Labs    01/16/18 1705 01/17/18 0537 01/17/18 1639 01/18/18 0334  WBC 7.0 8.6  --  14.6*  HGB 8.7* 8.5* 6.7* 6.6*  HCT 27.6* 26.7* 21.3* 20.7*  PLT 170 179  --  199   BMET Recent Labs    01/15/18 1035 01/16/18 0550  NA 133* 133*  K 4.5 4.2  CL 105 104  CO2 22 22  GLUCOSE 240* 207*  BUN 37* 36*  CREATININE 1.76* 1.70*  CALCIUM 8.5* 8.5*   LFT Recent Labs    01/16/18 0550  PROT 6.4*  ALBUMIN 2.7*  AST 45*  ALT 34  ALKPHOS 139*  BILITOT 0.8   PT/INR Recent Labs  01/17/18 0537 01/18/18 0334  LABPROT 20.6* 26.5*  INR 1.79 2.47     Studies/Results: Dg Abd 1 View  Result Date: 01/17/2018 CLINICAL DATA:  Abdominal distension, diarrhea, recent rectal bleeding, hemorrhoids, history diabetes mellitus, COPD, history CHF EXAM: ABDOMEN - 1 VIEW COMPARISON:  None FINDINGS: Nonobstructive bowel gas pattern. No bowel dilatation or bowel wall thickening. Bones demineralized. No urine tract calcification. IMPRESSION: Normal bowel gas pattern. Electronically Signed   By: Lavonia Dana M.D.   On: 01/17/2018 16:43     Tye Savoy, NP-C @  01/18/2018, 8:56 AM

## 2018-01-18 NOTE — Progress Notes (Signed)
Paged about agitation and patient getting up and out of bed, laying on his lines, trying to pull them out, and swinging at the nurses, we were asked to come evaluate for possible restraints. Went to evaluate at bedside. When team arrived patient was laying in bed on his side. He was alert but minimally interactive. He was oriented to person and place and when asked if he knew why he was here he stated because he was sick. While we were there we checked his blood pressure, while this was applying pressure he became more agitated and was wanting to take it off. He was also trying to sit up and applying a lot of pressure to his lines and laying on his lines. He did calm down for a bit later in the evaluation and asked to just be left alone. He has a Actuary at bedside. He was also recently given his mirtazapine and trazodone. It appears that he had a similar episode yesterday evening, where he became very anxious and was having inappropriate behaviors. He was given ativan and restarted on his home mirtazapine at that time.   Vitals showed a temp of 97.5, BP 84/52. HR 94, RR 20. On exam he was irritated at times and calm at other times, he was alert and oriented to person and place, he was cooperative and would follow commands appropriately. He did not want to be examined further then that.   At the time that we evaluated him he appeared calmer and not as agitated as he was earlier in the night. Since he was alert and oriented and able to follow commands and had recently gotten his mirtazapine and trazodone we decided against doing any type of restraints at the moment. We asked to nurse to page [place] again if he becomes agitated again and if she wants [place] to evaluate when they are checking his vitals. Will consider ativan if patient becomes agitated again.

## 2018-01-18 NOTE — H&P (View-Only) (Signed)
 Referring Provider: [facility] Primary Care Physician:  Patient, [name] Primary Gastroenterologist:  Unassigned  Reason for Consultation: GI bleed    Attending physician's note   I have taken a history, examined the patient and reviewed the chart. I agree with the Advanced Practitioner's note, impression and recommendations.  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  He was being conservatively managed for possible hemorrhoidal bleeding. Hematemesis and melena since yesterday evening. Hemoglobin trended down to 6.7 from baseline of 9-10 Tachycardic and hypotensive with systolic ranging from 70-90 and diastolic 40s to 60  ?  Cirrhosis: Low albumin.  INR elevated due to anticoagulation with Coumadin.  Platelet count is normal and he had normal synthetic function with INR 1.1 in July 2019.  No recent imaging.  Seems less likely to have progressed to cirrhosis  Currently on octreotide GTT, Ceftriaxone and PPI Monitor hemoglobin every 6 hours and transfuse if less than 7 Abdominal ultrasound pending No asterixis or evidence of hepatic encephalopathy  Currently Coumadin on hold, started on heparin gtt yesterday.  Heparin gtt. on hold since last night by primary team  We will proceed with urgent EGD to evaluate and further recommendation based on findings.  The risks and benefits as well as alternatives of endoscopic procedure(s) have been discussed and reviewed. All questions answered. The patient agrees to proceed.     K. Veena Nandigam , MD [phone]    ASSESSMENT AND PLAN:    1. 67 yo male with rectal pain and bleeding on coumadin at home (on hold) and heparin gtt now.  Initially felt to be hemorrhoidal but he then apparently had an episode of hematemesis. Stool is black on exam today.  -patient  needs EGD this am. The procedure was explained to him, questions answered. Heparin infusion was stopped on night shift. INR currently at 2.47 -continue BID IV PPI - monitor CBC. He got another units of blood earlier this am for hgb of 6.6, -follow up hgb result not yet resulted.   2. Agitation / restlessness. Staff having some difficulty managing him. Language is foul he is demanding. No asterixis on exam.   3. Mechanical aortic valve. Coumadin on hold, on heparin gtt. INR 2.47 - heparin stopped on night shift   4. Hx of colon resection, etiology ?  5. HCV, followed by ID. Unknown whether he has chronic liver disease. No imaging available. INR elevated from coumadin most likely. Platelets normal. ID has ordered outpatient elastrography   HPI: Adam Savage is a 67 y.o. male with DM, OSA, HCV, mechanical aortic vale on coumadin, seizures. He presented to ED on 10/3 with rectal pain with bleeding. Pelvic CT scan was unremarkable. Perianal area was red and macerated. Prior to early Sept his baseline hgb was around 12, since then it has been in 9-10 range. During this admission it has fallen to 6.7. He has received 2 units of blood without any improvement in hgb. Just received another unit of blood this am. Patient belligerent, difficult to get a history.  Denies hx of PUD. No abdominal pain, just rectal pain.     Past Medical History:  Diagnosis Date  . COPD (chronic obstructive pulmonary disease) (HCC)   . Diabetes mellitus without complication (HCC)   . Mechanical heart valve present   . Pre-diabetes   . Seizures (HCC)       Past Surgical History:  Procedure Laterality Date  . CARDIAC SURGERY    . CHOLECYSTECTOMY    . COLON SURGERY    . CORONARY/GRAFT ANGIOGRAPHY N/A 10/31/2017   Procedure: CORONARY/GRAFT ANGIOGRAPHY;  Surgeon: End, Christopher, MD;  Location: [facility];  Service: Cardiovascular;  Laterality: N/A;  . JOINT REPLACEMENT    . KNEE SURGERY Left     Prior to  Admission medications   Medication Sig Start Date End Date Taking? Authorizing Provider  acetaminophen (TYLENOL) 650 MG CR tablet Take 1,300 mg by mouth every 8 (eight) hours as needed for pain.   Yes [provider]  albuterol (PROVENTIL HFA;VENTOLIN HFA) 108 (90 Base) MCG/ACT inhaler Inhale 2 puffs into the lungs every 6 (six) hours as needed for wheezing or shortness of breath. 12/25/17  Yes Lee, Joshua K, MD  aspirin EC 81 MG tablet Take 81 mg by mouth daily.   Yes [provider]  atorvastatin (LIPITOR) 10 MG tablet Take 1 tablet (10 mg total) by mouth daily. 11/04/17 01/20/18 Yes Arrien, Mauricio Daniel, MD  carvedilol (COREG) 6.25 MG tablet Take 1 tablet (6.25 mg total) by mouth 2 (two) times daily with a meal. 11/27/17  Yes Varanasi, Jayadeep S, MD  clotrimazole (LOTRIMIN) 1 % cream Apply 1 application topically 2 (two) times daily. For 2 weeks 01/14/18  Yes Guilloud, Carolyn, MD  furosemide (LASIX) 20 MG tablet Take 3 tablets (60 mg total) by mouth daily. 12/25/17 12/25/18 Yes Lee, Joshua K, MD  hydrocortisone (ANUSOL-HC) 25 MG suppository Place 1 suppository (25 mg total) rectally 2 (two) times daily as needed for hemorrhoids or anal itching. 01/14/18 01/14/19 Yes Guilloud, Carolyn, MD  insulin detemir (LEVEMIR) 100 UNIT/ML injection Inject 12 Units into the skin daily.   Yes [provider]  levETIRAcetam (KEPPRA) 1000 MG tablet Take 1 tablet (1,000 mg total) by mouth 2 (two) times daily. 11/14/17  Yes Aquino, Karen M, MD  lisinopril (PRINIVIL,ZESTRIL) 10 MG tablet Take 10 mg by mouth daily.   Yes [provider]  mirtazapine (REMERON) 30 MG tablet Take 30 mg by mouth at bedtime. 12/07/17  Yes [provider]  ramipril (ALTACE) 5 MG capsule Take 1 capsule (5 mg total) by mouth daily. 11/27/17  Yes Varanasi, Jayadeep S, MD  ranitidine (ZANTAC) 150 MG tablet Take 150 mg by mouth 2 (two) times daily.  05/01/17  Yes [provider]  tamsulosin (FLOMAX)  0.4 MG CAPS capsule Take 0.4 mg by mouth at bedtime.  05/01/17  Yes [provider]  traZODone (DESYREL) 100 MG tablet Take 200 mg by mouth at bedtime. 10/15/17  Yes [provider]  warfarin (COUMADIN) 5 MG tablet Take 5 mg by mouth See admin instructions. Take one tablet (5 mg) by mouth Monday, Tuesday, Wednesday, Friday, Saturday nights - 9pm 05/02/17  Yes [provider]  warfarin (COUMADIN) 7.5 MG tablet Take 7.5 mg by mouth See admin instructions. Take one tablet (7.5 mg) by mouth on Sunday and Thursday nights - 9pm (take 5 mg tablet on all other days of the week) 05/01/17  Yes [provider]  blood glucose meter kit and supplies Dispense based on patient and insurance preference. Use up to four times daily as directed. (FOR ICD-10 E10.9, E11.9). 11/04/17   Arrien, Mauricio Daniel, MD  glucose blood test strip Use as instructed 01/13/18   Lee, Joshua K, MD  Lancets MISC 1 Units by Does not apply route 4 (four) times daily -  with meals   and at bedtime. 01/13/18   Lee, Joshua K, MD    Current Facility-Administered Medications  Medication Dose Route Frequency Provider Last Rate Last Dose  . 0.9 %  sodium chloride infusion (Manually program via Guardrails IV Fluids)   Intravenous Once Helberg, Justin, MD      . 0.9 %  sodium chloride infusion (Manually program via Guardrails IV Fluids)   Intravenous Once Krienke, Marissa M, MD      . albuterol (PROVENTIL) (2.5 MG/3ML) 0.083% nebulizer solution 2.5 mg  2.5 mg Nebulization Q6H PRN Hoffman, Erik C, DO   2.5 mg at 01/17/18 0524  . atorvastatin (LIPITOR) tablet 10 mg  10 mg Oral Daily Helberg, Justin, MD   10 mg at 01/18/18 0800  . carvedilol (COREG) tablet 6.25 mg  6.25 mg Oral BID WC Helberg, Justin, MD   6.25 mg at 01/18/18 0800  . cefTRIAXone (ROCEPHIN) 1 g in sodium chloride 0.9 % 100 mL IVPB  1 g Intravenous Q24H Helberg, Justin, MD 200 mL/hr at 01/17/18 1945 1 g at 01/17/18 1945  . clotrimazole (LOTRIMIN) 1 % cream 1  application  1 application Topical BID Helberg, Justin, MD   1 application at 01/18/18 0807  . hydrocortisone (ANUSOL-HC) suppository 25 mg  25 mg Rectal BID PRN Helberg, Justin, MD      . insulin aspart (novoLOG) injection 0-15 Units  0-15 Units Subcutaneous TID WC Helberg, Justin, MD   3 Units at 01/18/18 0758  . insulin aspart (novoLOG) injection 0-5 Units  0-5 Units Subcutaneous QHS Helberg, Justin, MD   3 Units at 01/16/18 2134  . insulin glargine (LANTUS) injection 15 Units  15 Units Subcutaneous QHS Helberg, Justin, MD   15 Units at 01/18/18 0025  . levETIRAcetam (KEPPRA) tablet 1,000 mg  1,000 mg Oral BID Helberg, Justin, MD   1,000 mg at 01/18/18 0800  . mirtazapine (REMERON) tablet 30 mg  30 mg Oral QHS Lee, Joshua K, MD   30 mg at 01/17/18 2209  . nicotine (NICODERM CQ - dosed in mg/24 hr) patch 7 mg  7 mg Transdermal Daily Lee, Joshua K, MD   7 mg at 01/18/18 0803  . octreotide (SANDOSTATIN) 500 mcg in sodium chloride 0.9 % 250 mL (2 mcg/mL) infusion  50 mcg/hr Intravenous Continuous Helberg, Justin, MD 25 mL/hr at 01/18/18 0646 50 mcg/hr at 01/18/18 0646  . ondansetron (ZOFRAN) injection 4 mg  4 mg Intravenous Q6H PRN Helberg, Justin, MD   4 mg at 01/17/18 1949  . oxyCODONE-acetaminophen (PERCOCET/ROXICET) 5-325 MG per tablet 1 tablet  1 tablet Oral Q6H PRN Svalina, Gorica, MD   1 tablet at 01/18/18 0447  . pantoprazole (PROTONIX) injection 40 mg  40 mg Intravenous Q12H Helberg, Justin, MD   40 mg at 01/18/18 0801  . senna-docusate (Senokot-S) tablet 1 tablet  1 tablet Oral BID Helberg, Justin, MD   1 tablet at 01/17/18 2209  . tamsulosin (FLOMAX) capsule 0.4 mg  0.4 mg Oral QHS Helberg, Justin, MD   0.4 mg at 01/17/18 2209  . traZODone (DESYREL) tablet 200 mg  200 mg Oral QHS Lee, Joshua K, MD   200 mg at 01/17/18 2209    Allergies as of 01/15/2018 - Review Complete 01/15/2018  Allergen Reaction Noted  . Morphine and related Other (See Comments) 12/21/2017    Family History    Problem Relation Age of Onset  . Hypertension Father     Social History   Socioeconomic History  . Marital status: Married      Spouse name: Not on file  . Number of children: Not on file  . Years of education: Not on file  . Highest education level: Not on file  Occupational History  . Not on file  Social Needs  . Financial resource strain: Not on file  . Food insecurity:    Worry: Not on file    Inability: Not on file  . Transportation needs:    Medical: Not on file    Non-medical: Not on file  Tobacco Use  . Smoking status: Current Every Day Smoker    Packs/day: 1.00    Types: Cigarettes  . Smokeless tobacco: Never Used  Substance and Sexual Activity  . Alcohol use: Not Currently  . Drug use: Never  . Sexual activity: Not on file  Lifestyle  . Physical activity:    Days per week: Not on file    Minutes per session: Not on file  . Stress: Not on file  Relationships  . Social connections:    Talks on phone: Not on file    Gets together: Not on file    Attends religious service: Not on file    Active member of club or organization: Not on file    Attends meetings of clubs or organizations: Not on file    Relationship status: Not on file  . Intimate partner violence:    Fear of current or ex partner: Not on file    Emotionally abused: Not on file    Physically abused: Not on file    Forced sexual activity: Not on file  Other Topics Concern  . Not on file  Social History Narrative  . Not on file    Review of Systems: All systems reviewed and negative except where noted in HPI.  Physical Exam: Vital signs in last 24 hours: Temp:  [97.4 F (36.3 C)-98.1 F (36.7 C)] 97.5 F (36.4 C) (10/06 0455) Pulse Rate:  [76-101] 92 (10/06 0455) Resp:  [15-22] 20 (10/06 0455) BP: (68-117)/(37-78) 91/78 (10/06 0455) SpO2:  [98 %-100 %] 100 % (10/06 0131) Last BM Date: 01/16/18 General:   Alert, obese male in NAD Psych:  Agitated, demanding.  Ears:  Normal auditory  acuity. Nose:  No deformity, discharge,  or lesions. Neck:  Supple; no masses Lungs:  Clear throughout to auscultation.   No wheezes, crackles, or rhonchi.  Heart:  Regular rate and rhythm Abdomen:  Soft,  nontender, BS active  Rectal:  Deferred  Msk:  Symmetrical without gross deformities. . Neurologic:  Alert and  oriented x4;  grossly normal neurologically.No asterixis Skin:  Intact without significant lesions or rashes..   Intake/Output from previous day: 10/05 0701 - 10/06 0700 In: 360 [P.O.:360] Out: -  Intake/Output this shift: Total I/O In: -  Out: 100 [Urine:100]  Lab Results: Recent Labs    01/16/18 1705 01/17/18 0537 01/17/18 1639 01/18/18 0334  WBC 7.0 8.6  --  14.6*  HGB 8.7* 8.5* 6.7* 6.6*  HCT 27.6* 26.7* 21.3* 20.7*  PLT 170 179  --  199   BMET Recent Labs    01/15/18 1035 01/16/18 0550  NA 133* 133*  K 4.5 4.2  CL 105 104  CO2 22 22  GLUCOSE 240* 207*  BUN 37* 36*  CREATININE 1.76* 1.70*  CALCIUM 8.5* 8.5*   LFT Recent Labs    01/16/18 0550  PROT 6.4*  ALBUMIN 2.7*  AST 45*  ALT 34  ALKPHOS 139*  BILITOT 0.8   PT/INR Recent Labs      01/17/18 0537 01/18/18 0334  LABPROT 20.6* 26.5*  INR 1.79 2.47     Studies/Results: Dg Abd 1 View  Result Date: 01/17/2018 CLINICAL DATA:  Abdominal distension, diarrhea, recent rectal bleeding, hemorrhoids, history diabetes mellitus, COPD, history CHF EXAM: ABDOMEN - 1 VIEW COMPARISON:  None FINDINGS: Nonobstructive bowel gas pattern. No bowel dilatation or bowel wall thickening. Bones demineralized. No urine tract calcification. IMPRESSION: Normal bowel gas pattern. Electronically Signed   By: Mark  Boles M.D.   On: 01/17/2018 16:43     Paula Guenther, NP-C @  01/18/2018, 8:56 AM     

## 2018-01-18 NOTE — Consult Note (Signed)
[facility] Consult   Reason for Consult: ''agitation, capacity.'' Referring Physician:  Dr. Beryle Beams Patient Identification: Adam Savage MRN:  [id number] Principal Diagnosis: Mallory-Weiss tear Diagnosis:   Patient Active Problem List   Diagnosis Date Noted  . 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 Blood loss anemia [D50.0]   . Perianal infection [K62.89] 01/15/2018  . Diarrhea [R19.7] 01/15/2018  . Hypotension [I95.9] 01/15/2018  . Internal hemorrhoid, bleeding [K64.8] 01/14/2018  . Intertrigo [L30.4] 01/14/2018  . Chronic hepatitis C without hepatic coma ([place]) [B18.2] 01/12/2018  . 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 Leg swelling [M79.89] 12/22/2017  . Encounter for therapeutic drug monitoring [Z51.81] 11/06/2017  . Non-ST elevation (NSTEMI) myocardial infarction ([place]) [I21.4]   . Anticoagulant long-term use [Z79.01]   . Unstable angina ([place]) [I20.0] 10/29/2017  . Chest pain [R07.9] 10/28/2017  . Seizures ([place]) [R56.9]   . COPD (chronic obstructive pulmonary disease) ([place]) [J44.9]   . Pre-diabetes [R73.03]   . Overdose of muscle relaxant [T48.201A] 08/11/2017  . Folic acid deficiency anemia [D52.9] 06/20/2016  . Obesity [E66.9] 09/21/2015  . Fatty liver disease, nonalcoholic [F02.6] 37/85/8850  . Elevated liver enzymes [R74.8] 07/28/2015  . Osteoarthritis [M19.90] 07/28/2015  . OSA (obstructive sleep apnea) [G47.33] 05/05/2015  . Joint pain [M25.50] 11/12/2013  . Tension headache, chronic [G44.229] 08/25/2013  . 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 Epilepsy ([place]) [Y77.412] 11/03/2012  . H/O diverticulitis of colon [Z87.19] 11/03/2012  . Mechanical heart valve present [Z95.2] 09/05/2012    Total Time spent with patient: 45 minutes  Subjective:   Adam Savage is a 67 y.o. male patient admitted with rectal pain and pain  HPI:  Patient with history of AS s/p mechanical valve replacement on  Warfarin, epilepsy, and DM who was admitted due to worsening rectal pain and bleeding. Patient denies past history of mental illness. He states that prior his admission he did not sleep for 5 days and has been getting increasingly agitated due rectal pain from hemorrhoids.Currently, he is cooperative, alert, oriented x3 and denies psychosis, delusions, SI/HI. He states that his major problem is the rectal pain and lack of sleep.   Past Psychiatric History: denies  Risk to Self:  denies Risk to Others:  denies  Prior Inpatient Therapy:   denies Prior Outpatient Therapy:  none  Past Medical History:  Past Medical History:  Diagnosis Date  . COPD (chronic obstructive pulmonary disease) ([place])   . Diabetes mellitus without complication ([place])   . Mechanical heart valve present   . Pre-diabetes   . Seizures ([place])     Past Surgical History:  Procedure Laterality Date  . CARDIAC SURGERY    . CHOLECYSTECTOMY    . COLON SURGERY    . CORONARY/GRAFT ANGIOGRAPHY N/A 10/31/2017   Procedure: CORONARY/GRAFT ANGIOGRAPHY;  Surgeon: Nelva Bush, MD;  Location: [facility];  Service: Cardiovascular;  Laterality: N/A;  . JOINT REPLACEMENT    . KNEE SURGERY Left    Family History:  Family History  Problem Relation Age of Onset  . Hypertension Father    Family Psychiatric  History:  Social History:  Social History   Substance and Sexual Activity  Alcohol Use Not Currently     Social History   Substance and Sexual Activity  Drug Use Never    Social History   Socioeconomic History  . Marital status: Married    Spouse name: Not on file  . Number of children: Not on  file  . Years of education: Not on file  . Highest education level: Not on file  Occupational History  . Not on file  Social Needs  . Financial resource strain: Not on file  . Food insecurity:    Worry: Not on file    Inability: Not on file  . Transportation needs:    Medical: Not on file    Non-medical: Not  on file  Tobacco Use  . Smoking status: Current Every Day Smoker    Packs/day: 1.00    Types: Cigarettes  . Smokeless tobacco: Never Used  Substance and Sexual Activity  . Alcohol use: Not Currently  . Drug use: Never  . Sexual activity: Not on file  Lifestyle  . Physical activity:    Days per week: Not on file    Minutes per session: Not on file  . Stress: Not on file  Relationships  . Social connections:    Talks on phone: Not on file    Gets together: Not on file    Attends religious service: Not on file    Active member of club or organization: Not on file    Attends meetings of clubs or organizations: Not on file    Relationship status: Not on file  Other Topics Concern  . Not on file  Social History Narrative  . Not on file   Additional Social History:    Allergies:   Allergies  Allergen Reactions  . Morphine And Related Other (See Comments)    Combative     Labs:  Results for orders placed or performed during the hospital encounter of 01/15/18 (from the past 48 hour(s))  CBC     Status: Abnormal   Collection Time: 01/16/18  5:05 PM  Result Value Ref Range   WBC 7.0 4.0 - 10.5 K/uL   RBC 3.42 (L) 4.22 - 5.81 MIL/uL   Hemoglobin 8.7 (L) 13.0 - 17.0 g/dL   HCT 27.6 (L) 39.0 - 52.0 %   MCV 80.7 78.0 - 100.0 fL   MCH 25.4 (L) 26.0 - 34.0 pg   MCHC 31.5 30.0 - 36.0 g/dL   RDW 14.4 11.5 - 15.5 %   Platelets 170 150 - 400 K/uL    Comment: Performed at [facility], [place] [address], [place], [place] [address]  Glucose, capillary     Status: Abnormal   Collection Time: 01/16/18  5:47 PM  Result Value Ref Range   Glucose-Capillary 188 (H) 70 - 99 mg/dL  Glucose, capillary     Status: Abnormal   Collection Time: 01/16/18  9:26 PM  Result Value Ref Range   Glucose-Capillary 275 (H) 70 - 99 mg/dL  Protime-INR     Status: Abnormal   Collection Time: 01/17/18  5:37 AM  Result Value Ref Range   Prothrombin Time 20.6 (H) 11.4 - 15.2 seconds   INR 1.79      Comment: Performed at [facility], [place] [address], [place], [place] [address]  CBC     Status: Abnormal   Collection Time: 01/17/18  5:37 AM  Result Value Ref Range   WBC 8.6 4.0 - 10.5 K/uL   RBC 3.33 (L) 4.22 - 5.81 MIL/uL   Hemoglobin 8.5 (L) 13.0 - 17.0 g/dL   HCT 26.7 (L) 39.0 - 52.0 %   MCV 80.2 78.0 - 100.0 fL   MCH 25.5 (L) 26.0 - 34.0 pg   MCHC 31.8 30.0 - 36.0 g/dL   RDW 14.4  11.5 - 15.5 %   Platelets 179 150 - 400 K/uL    Comment: Performed at [facility], [place] [address], [place], [place] [address]  Glucose, capillary     Status: Abnormal   Collection Time: 01/17/18  8:09 AM  Result Value Ref Range   Glucose-Capillary 158 (H) 70 - 99 mg/dL  Glucose, capillary     Status: Abnormal   Collection Time: 01/17/18 12:41 PM  Result Value Ref Range   Glucose-Capillary 156 (H) 70 - 99 mg/dL  Hemoglobin and hematocrit, blood     Status: Abnormal   Collection Time: 01/17/18  4:39 PM  Result Value Ref Range   Hemoglobin 6.7 (LL) 13.0 - 17.0 g/dL    Comment: REPEATED TO VERIFY CRITICAL RESULT CALLED TO, READ BACK BY AND VERIFIED WITH: D LEWIS RN @ 6314 ON 01/17/18 BY HTEMOCHE    HCT 21.3 (L) 39.0 - 52.0 %    Comment: Performed at [facility], [place] [address], [place], [place] [address]  Glucose, capillary     Status: Abnormal   Collection Time: 01/17/18  5:30 PM  Result Value Ref Range   Glucose-Capillary 173 (H) 70 - 99 mg/dL  Prepare RBC     Status: None   Collection Time: 01/17/18  5:37 PM  Result Value Ref Range   Order Confirmation      ORDER PROCESSED BY BLOOD BANK Performed at [facility], [place] [address], [place], [place] [address]   Glucose, capillary     Status: Abnormal   Collection Time: 01/17/18  8:27 PM  Result Value Ref Range   Glucose-Capillary 189 (H) 70 - 99 mg/dL  Glucose, capillary     Status: Abnormal   Collection Time: 01/17/18  9:18 PM  Result Value Ref Range   Glucose-Capillary 195 (H) 70 - 99 mg/dL  Protime-INR      Status: Abnormal   Collection Time: 01/18/18  3:34 AM  Result Value Ref Range   Prothrombin Time 26.5 (H) 11.4 - 15.2 seconds   INR 2.47     Comment: Performed at [facility], [place] [address] [place], [place] [address]  CBC     Status: Abnormal   Collection Time: 01/18/18  3:34 AM  Result Value Ref Range   WBC 14.6 (H) 4.0 - 10.5 K/uL   RBC 2.45 (L) 4.22 - 5.81 MIL/uL   Hemoglobin 6.6 (LL) 13.0 - 17.0 g/dL    Comment: REPEATED TO VERIFY SPECIMEN CHECKED FOR CLOTS CRITICAL RESULT CALLED TO, READ BACK BY AND VERIFIED WITHCandiss Norse RN [phone] [id number] SHORTT    HCT 20.7 (L) 39.0 - 52.0 %   MCV 84.5 78.0 - 100.0 fL   MCH 26.9 26.0 - 34.0 pg   MCHC 31.9 30.0 - 36.0 g/dL   RDW 14.4 11.5 - 15.5 %   Platelets 199 150 - 400 K/uL    Comment: Performed at [facility], [place] [address] [place], [place] [address]  Heparin level (unfractionated)     Status: Abnormal   Collection Time: 01/18/18  3:34 AM  Result Value Ref Range   Heparin Unfractionated 0.22 (L) 0.30 - 0.70 IU/mL    Comment: (NOTE) If heparin results are below expected values, and patient dosage has  been confirmed, suggest follow up testing of antithrombin III levels. Performed at [facility], [place] [address] [place], [place] [address]   Glucose, capillary     Status: Abnormal  Collection Time: 01/18/18  6:42 AM  Result Value Ref Range   Glucose-Capillary 198 (H) 70 - 99 mg/dL  Glucose, capillary     Status: Abnormal   Collection Time: 01/18/18  7:37 AM  Result Value Ref Range   Glucose-Capillary 193 (H) 70 - 99 mg/dL  CBC     Status: Abnormal   Collection Time: 01/18/18  8:49 AM  Result Value Ref Range   WBC 17.2 (H) 4.0 - 10.5 K/uL   RBC 2.69 (L) 4.22 - 5.81 MIL/uL   Hemoglobin 7.3 (L) 13.0 - 17.0 g/dL   HCT 22.8 (L) 39.0 - 52.0 %   MCV 84.8 78.0 - 100.0 fL   MCH 27.1 26.0 - 34.0 pg   MCHC 32.0 30.0 - 36.0 g/dL   RDW 14.6 11.5 - 15.5 %   Platelets 183 150 - 400 K/uL    Comment: Performed at  [facility], [place] [address] [place], [place] [address]  Glucose, capillary     Status: Abnormal   Collection Time: 01/18/18 12:10 PM  Result Value Ref Range   Glucose-Capillary 143 (H) 70 - 99 mg/dL  Hemoglobin and hematocrit, blood     Status: Abnormal   Collection Time: 01/18/18  1:19 PM  Result Value Ref Range   Hemoglobin 6.8 (LL) 13.0 - 17.0 g/dL    Comment: REPEATED TO VERIFY CRITICAL RESULT CALLED TO, READ BACK BY AND VERIFIED WITH: J GANOU,RN AT 1342 01/18/18 BY L BENFIELD    HCT 21.5 (L) 39.0 - 52.0 %    Comment: Performed at [facility], [place] [address] [place], [place] [address]    Current Facility-Administered Medications  Medication Dose Route Frequency Provider Last Rate Last Dose  . 0.9 %  sodium chloride infusion (Manually program via Guardrails IV Fluids)   Intravenous Once Ina Homes, MD      . albuterol (PROVENTIL) (2.5 MG/3ML) 0.083% nebulizer solution 2.5 mg  2.5 mg Nebulization Q6H PRN Joni Reining C, DO   2.5 mg at 01/17/18 0524  . atorvastatin (LIPITOR) tablet 10 mg  10 mg Oral Daily Helberg, Larkin Ina, MD   10 mg at 01/18/18 0800  . carvedilol (COREG) tablet 6.25 mg  6.25 mg Oral BID WC Helberg, Justin, MD   6.25 mg at 01/18/18 0800  . clotrimazole (LOTRIMIN) 1 % cream 1 application  1 application Topical BID Ina Homes, MD   1 application at 00/17/49 0807  . hydrocortisone (ANUSOL-HC) suppository 25 mg  25 mg Rectal BID PRN Helberg, Larkin Ina, MD      . insulin aspart (novoLOG) injection 0-15 Units  0-15 Units Subcutaneous TID WC Ina Homes, MD   2 Units at 01/18/18 1240  . insulin aspart (novoLOG) injection 0-5 Units  0-5 Units Subcutaneous QHS Ina Homes, MD   3 Units at 01/16/18 2134  . insulin glargine (LANTUS) injection 15 Units  15 Units Subcutaneous QHS Ina Homes, MD   15 Units at 01/18/18 0025  . levETIRAcetam (KEPPRA) tablet 1,000 mg  1,000 mg Oral BID Ina Homes, MD   1,000 mg at 01/18/18 0800  . mirtazapine  (REMERON) tablet 30 mg  30 mg Oral QHS [name], MD   30 mg at 01/17/18 2209  . nicotine (NICODERM CQ - dosed in mg/24 hr) patch 7 mg  7 mg Transdermal Daily [name], MD   7 mg at 01/18/18 0803  . ondansetron (ZOFRAN) injection 4 mg  4 mg Intravenous Q6H PRN Ina Homes, MD  4 mg at 01/17/18 1949  . oxyCODONE-acetaminophen (PERCOCET/ROXICET) 5-325 MG per tablet 1 tablet  1 tablet Oral Q6H PRN Alphonzo Grieve, MD   1 tablet at 01/18/18 1245  . pantoprazole (PROTONIX) injection 40 mg  40 mg Intravenous Q12H Ina Homes, MD   40 mg at 01/18/18 0801  . QUEtiapine (SEROQUEL) tablet 100 mg  100 mg Oral QHS Leontyne Manville, MD      . senna-docusate (Senokot-S) tablet 1 tablet  1 tablet Oral BID Ina Homes, MD   1 tablet at 01/17/18 2209  . tamsulosin (FLOMAX) capsule 0.4 mg  0.4 mg Oral QHS Helberg, Larkin Ina, MD   0.4 mg at 01/17/18 2209  . witch hazel-glycerin (TUCKS) pad   Topical PRN Carroll Sage, MD        Musculoskeletal: Strength & Muscle Tone: within normal limits Gait & Station: lying in bed Patient leans: N/A  Psychiatric Specialty Exam: Physical Exam  Psychiatric: He has a normal mood and affect. His speech is normal. Judgment and thought content normal. He is agitated. Cognition and memory are normal.    Review of Systems  Constitutional: Negative.   HENT: Negative.   Eyes: Negative.   Respiratory: Negative.   Cardiovascular: Negative.   Skin: Negative.   Psychiatric/Behavioral: The patient has insomnia.     Blood pressure (!) 88/50, pulse 71, temperature 97.6 F (36.4 C), temperature source Oral, resp. rate 12, height 6' (1.829 m), weight 125.6 kg, SpO2 100 %.Body mass index is 37.57 kg/m.  General Appearance: Casual  Eye Contact:  Good  Speech:  Clear and Coherent  Volume:  Normal  Mood:  Euthymic  Affect:  Constricted  Thought Process:  Coherent and Linear  Orientation:  Full (Time, Place, and Person)  Thought Content:  Logical  Suicidal  Thoughts:  No  Homicidal Thoughts:  No  Memory:  Immediate;   Good Recent;   Fair Remote;   Good  Judgement:  Fair  Insight:  Fair  Psychomotor Activity:  Normal  Concentration:  Concentration: Fair and Attention Span: Fair  Recall:  Good  Fund of Knowledge:  Good  Language:  Good  Akathisia:  No  Handed:  Right  AIMS (if indicated):     Assets:  Communication Skills Desire for Improvement  ADL's:  Intact  Cognition:  WNL  Sleep:   poor     Treatment Plan Summary: 67 y.o male with a history of AS s/p mechanical valve replacement on Warfarin, epilepsy, and DM who denies prior history of mental illness. Patient reports being agitated and unable to sleep for 5 days due to rectal pain secondary to Hemorrhoids. He is now cooperative, alert and oriented x 3. Patient has Capacity to make inform decision based on my evaluation today.  Recommendations: -Discontinue Trazodone, not helping insomnia. -Consider Seroquel 100 mg daily at bedtime for agitation and Insomnia. -Consider Haldol 2 mg po every 6 hrs as needed for agitation. -Psychiatric service is signing out. Re-consult psych as needed  Disposition: No evidence of imminent risk to self or others at present.    Corena Pilgrim, MD 01/18/2018 1:47 PM

## 2018-01-18 NOTE — Progress Notes (Signed)
ANTICOAGULATION CONSULT NOTE  Pharmacy Consult:  Coumadin >> Heparin Indication: Mechanical Valve (aortic)  Allergies  Allergen Reactions  . Morphine And Related Other (See Comments)    Combative     Patient Measurements: Height: 6' (182.9 cm) Weight: 277 lb (125.6 kg) IBW/kg (Calculated) : 77.6  Heparin dosing wt: 105 kg  Vital Signs: Temp: 97.5 F (36.4 C) (10/06 0455) Temp Source: Axillary (10/06 0455) BP: 91/78 (10/06 0455) Pulse Rate: 92 (10/06 0455)  Labs: Recent Labs    01/15/18 1035  01/16/18 0550 01/16/18 1705 01/17/18 0537 01/17/18 1639 01/18/18 0334  HGB 8.1*   < > 9.3* 8.7* 8.5* 6.7* 6.6*  HCT 26.0*   < > 28.6* 27.6* 26.7* 21.3* 20.7*  PLT 163   < > 189 170 179  --  199  LABPROT 28.9*  --  25.0*  --  20.6*  --  26.5*  INR 2.76  --  2.28  --  1.79  --  2.47  HEPARINUNFRC  --   --   --   --   --   --  0.22*  CREATININE 1.76*  --  1.70*  --   --   --   --    < > = values in this interval not displayed.    Estimated Creatinine Clearance: 57.7 mL/min (A) (by C-G formula based on SCr of 1.7 mg/dL (H)).    Assessment: 58 YOM presented with rectal pain and low hemoglobin on chronic Coumadin for mechanical heart valve.  Pharmacy consulted to continue Coumadin. Pt with concern for GIB. Pharmacy consulted to start IV heparin while holding warfarin with low target goal 0.3-0.5.  Home Coumadin dose: 5mg  daily except 7.5mg  on Thurs/Sun  Heparin level remains subtherapeutic (0.22) on gtt at 1250 units/hr. Hgb remains low at 6.6 (s/p PRBCx 1 and another one ordered).  INR trended up to 2.47 (therapeutic for goal rate of 2-3 with aortic mechanical valve). Spoke with on call MD, Dr. [name], and will hold heparin until INR <2.    Goal of Therapy:  Heparin Level 0.3-0.5 units/ml INR 2-3  Monitor platelets by anticoagulation protocol: Yes    Plan:  -Hold heparin for now -F/u INR in the morning and restart heparin when INR <2  Sherlon Handing, PharmD,  BCPS Clinical pharmacist  **Pharmacist phone directory can now be found on [email or web address] ([email or web address]).  Listed under [place]. 01/18/2018

## 2018-01-18 NOTE — Progress Notes (Signed)
   Subjective: Patient doing well this morning. He has had no more episodes of hematemesis. He denies abdominal pain. Understands that he is going down for endoscopy this morning to see if we can localize where his bleeding is coming from. All questions and concerns addressed.  Objective: Vital signs in last 24 hours: Vitals:   01/18/18 0054 01/18/18 0119 01/18/18 0131 01/18/18 0455  BP: (!) 94/46 (!) 94/46 (!) 91/48 91/78  Pulse:    92  Resp:    20  Temp: (!) 97.4 F (36.3 C) (!) 97.4 F (36.3 C) (!) 97.4 F (36.3 C) (!) 97.5 F (36.4 C)  TempSrc: Axillary Axillary Axillary Axillary  SpO2: 100% 100% 100%   Weight:      Height:       General: Obese male in no acute distress Pulm: Good air movement with no wheezing or crackles  CV: RRR, mechanical click  Abdomen: Soft, distended, no tenderness to palpation  Extremities: No LE edema  Neuro: Alert and oriented x 3, but overall seems to have fluctuating mental status  Assessment/Plan:  [name] is a 67 y.o male with a history of AS s/p mechanical valve replacement on Warfarin, epilepsy, and DM who presented to the ED with worsening rectal pain and bleeding. He was doing well until 10/05 when he subsequently developed hematemesis and hypotension. He is now being managed for an acute upper GI bleed as outlined below.   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  - GI taking him for EGD this AM, will follow-up for further recommendations   AS s/p mechanical valve replacement  - Previously on Warfarin, discontinued after patient experienced  hematemesis. Started on heparin drip per pharmacy. Will need bridged back to Warfarin after this acute event.   Encephalopathy - Continue to exhibit features of sundowning - Possibly related to underlying cognitive impairment leading to delirium while hospitalized  - On ceftriaxone for SBP prophylaxis  - Continue to monitor  - Currently has 1:1 sitter.   Perianal and gluteal intertrigo  - Patient states that the pain is improved compared to prior  - Continuing clotrimazole cream BID  Internal Hemorrhoids  - Continue hydrocortisone suppositories   Hepatitis C Palpable Purpura  - Patient with progression of his bilateral LE palpable purpura - Previous biopsy showing leukocytoclastic vasculitis  - Need to have his hepatitis treated which would help with the cryo + vasculitis  - Outpatient follow-up with ID  Dispo: Anticipated discharge pending SNF placement.   Adam Homes, MD 01/18/2018, 6:15 AM

## 2018-01-18 NOTE — Progress Notes (Signed)
Paged to the bedside after the patient arrived back from upper endoscopy that he is agitated. Presented to bedside for further evaluation. Patient is agitated and foul with his language. He is redirectable. Continues to have fluctuations in his mental status that is consistent with delirium. Question whether there is underlying cognitive impairment or personality disorder. He does not need haldol at this point since he is redirectable; however, if the patient become agitated, violent with staff, or felt to be a safety concern we will order haldol.   Have consulted psych for further recommendations and to assess for underlying personality disorders.

## 2018-01-18 NOTE — Progress Notes (Signed)
01-18-18  1550   IV Team Note;  Called to start a 2nd iv for pt receiving 2 units of blood;  Pt has pulled out 4 ivs in the last 24 hours per RN;  Suggest mittens for this pt as he is somewhat of a difficult stick and becomes very easily agitated.   Adam Fanning RN IV Team

## 2018-01-18 NOTE — Progress Notes (Signed)
CRITICAL VALUE ALERT  Critical Value: Hgb 6.8  Date & Time Notied:  6286 01/18/18  Provider Notified: Helberg  Orders Received/Actions taken: Blood Transfusion orders received for 2 U PRBCs.

## 2018-01-18 NOTE — Progress Notes (Signed)
Medicine attending: I examined this patient today and I concur with the evaluation and management plan as recorded by resident physician Dr. Ina Homes. Patient initially admitted for evaluation of what was felt to be hemorrhoidal bleeding.  He developed an episode of hematemesis yesterday.  Transient drop in blood pressure.  Significant fall in hemoglobin.  Stabilized with fluid resuscitation and blood.  He underwent uncomplicated upper endoscopy this morning.   Findings: Patient Name: Adam Savage Procedure Date : 01/18/2018 MRN: [id number] Attending MD: Mauri Pole, MD Date of Birth: [birth year] CSN: [id number] Age: 67 Admit Type: Inpatient - LA Grade C reflux esophagitis. - Salmon-colored mucosa suspicious for short-segment Barrett's esophagus. - Mallory-Weiss tear likely etiology of upper GI hemorrhage. - Gastritis. - Normal examined duodenum. - No specimens collected. On return from the procedure he became agitated and abusive to the staff.  We appreciate prompt psychiatric evaluation.  Patient had calmed down and was cooperative.  No acute or chronic psychiatric condition determined.  Medication recommendations made which we will follow.  No active bleeding seen at time of the endoscopic procedure.  Hemoglobin has drifted down to 6.8.  We will transfuse.  Observe overnight.  Anticipate discharge tomorrow if otherwise stable. Safe to resume anticoagulation.  Note he has a mechanical aortic valve in place.

## 2018-01-18 NOTE — Interval H&P Note (Signed)
History and Physical Interval Note:  01/18/2018 10:29 AM  Adam Savage  has presented today for surgery, with the diagnosis of Hematemesis, melena  The various methods of treatment have been discussed with the patient and family. After consideration of risks, benefits and other options for treatment, the patient has consented to  Procedure(s): ESOPHAGOGASTRODUODENOSCOPY (EGD) WITH PROPOFOL (N/A) as a surgical intervention .  The patient's history has been reviewed, patient examined, no change in status, stable for surgery.  I have reviewed the patient's chart and labs.  Questions were answered to the patient's satisfaction.     Kavitha Nandigam

## 2018-01-18 NOTE — Significant Event (Signed)
Rapid Response Event Note  Follow Up:  I was rounding on 2W and went to see the patient and nurse. Upon arrival into the room, patient was very restless, impulsive, and nursing staff could not keep the patient in the bed. RN was unable to obtain BPs due to patient's agitation, patient was swinging out the nursing staff. Currently getting PRBCs, Octreotide, and Heparin infusions.   HR - SR, BP - reading soft at times but hard to assess completely because patient's noncompliance with BP assessments, mild tachypnea when sitting up, but not in acute respiratory distress.  I paged the IMTS MDs at 2305, they came to assess the patient.  I was called to see another patient. IMTS MD did share the plan of care with RN.  Call RR RN if needed.

## 2018-01-18 NOTE — Op Note (Signed)
[facility] Patient Name: Adam Savage Procedure Date : 01/18/2018 MRN: [id number] Attending MD: Mauri Pole , MD Date of Birth: 05/03/1950 CSN: [id number] Age: 67 Admit Type: Inpatient Procedure:                Upper GI endoscopy Indications:              Active gastrointestinal bleeding. Hematemesis and                            melena Providers:                Mauri Pole, MD, Baird Cancer, RN, Charolette Child, Technician, Maia Plan, CRNA Referring MD:              Medicines:                Monitored Anesthesia Care Complications:            No immediate complications. Estimated Blood Loss:     Estimated blood loss was minimal. Procedure:                Pre-Anesthesia Assessment:                           - Prior to the procedure, a History and Physical                            was performed, and patient medications and                            allergies were reviewed. The patient's tolerance of                            previous anesthesia was also reviewed. The risks                            and benefits of the procedure and the sedation                            options and risks were discussed with the patient.                            All questions were answered, and informed consent                            was obtained. Prior Anticoagulants: The patient                            last took Coumadin (warfarin) 1 day prior to the                            procedure and last took heparin on the day of the  procedure. ASA Grade Assessment: III - A patient                            with severe systemic disease. After reviewing the                            risks and benefits, the patient was deemed in                            satisfactory condition to undergo the procedure.                           After obtaining informed consent, the endoscope was   passed under direct vision. Throughout the                            procedure, the patient's blood pressure, pulse, and                            oxygen saturations were monitored continuously. The                            GIF-H190 ([id number]) Olympus Adult EGD was introduced                            through the mouth, and advanced to the second part                            of duodenum. The upper GI endoscopy was                            accomplished without difficulty. The patient                            tolerated the procedure well. Scope In: Scope Out: Findings:      LA Grade C (one or more mucosal breaks continuous between tops of 2 or       more mucosal folds, less than 75% circumference) esophagitis with no       bleeding was found 34 to 38 cm from the incisors.      Three tongues of salmon-colored mucosa were present at 36 cm. The       maximum longitudinal extent of these esophageal mucosal changes was 2 cm       in length. No esophageal varices.      A less than 5 mm non-bleeding Mallory-Weiss tear with stigmata of recent       bleeding, small adherent heme was found. No visible vessel.      Patchy mild inflammation characterized by congestion (edema) and       erythema was found in the entire examined stomach. No gastric varices.      The examined duodenum was normal. Impression:               - LA Grade C reflux esophagitis.                           -  Salmon-colored mucosa suspicious for                            short-segment Barrett's esophagus.                           - Mallory-Weiss tear likely etiology of upper GI                            hemorrhage.                           - Gastritis.                           - Normal examined duodenum.                           - No specimens collected. Recommendation:           - Patient has a contact number available for                            emergencies. The signs and symptoms of potential                             delayed complications were discussed with the                            patient. Return to normal activities tomorrow.                            Written discharge instructions were provided to the                            patient.                           - Resume previous diet.                           - Continue present medications.                           - Protonix 40mg  twice daily, 30 minutes before                            breakfast and dinner                           - OK to stop Octreotide gtt                           - Resume Coumadin (warfarin) tomorrow if no further                            bleeding with stable Hgb and restart heparin gtt  today at prior doses. Refer to managing physician                            for further adjustment of therapy.                           - Monitor Hgb and transfuse as needed to maintain >7                           - Follow up abdominal ultrasound                           - No ibuprofen, naproxen, or other non-steroidal                            anti-inflammatory drugs. Procedure Code(s):        --- Professional ---                           [phone], Esophagogastroduodenoscopy, flexible,                            transoral; diagnostic, including collection of                            specimen(s) by brushing or washing, when performed                            (separate procedure) Diagnosis Code(s):        --- Professional ---                           K21.0, Gastro-esophageal reflux disease with                            esophagitis                           K22.8, Other specified diseases of esophagus                           K22.6, Gastro-esophageal laceration-hemorrhage                            syndrome                           K29.70, Gastritis, unspecified, without bleeding                           K92.2, Gastrointestinal hemorrhage, unspecified CPT copyright 2017  American Medical Association. All rights reserved. The codes documented in this report are preliminary and upon coder review may  be revised to meet current compliance requirements. Mauri Pole, MD 01/18/2018 11:24:18 AM This report has been signed electronically. Number of Addenda: 0

## 2018-01-19 ENCOUNTER — Inpatient Hospital Stay (HOSPITAL_COMMUNITY): Payer: Medicare Other

## 2018-01-19 DIAGNOSIS — B182 Chronic viral hepatitis C: Secondary | ICD-10-CM

## 2018-01-19 DIAGNOSIS — D891 Cryoglobulinemia: Secondary | ICD-10-CM

## 2018-01-19 DIAGNOSIS — G92 Toxic encephalopathy: Secondary | ICD-10-CM

## 2018-01-19 DIAGNOSIS — L959 Vasculitis limited to the skin, unspecified: Secondary | ICD-10-CM

## 2018-01-19 DIAGNOSIS — L304 Erythema intertrigo: Secondary | ICD-10-CM

## 2018-01-19 DIAGNOSIS — K648 Other hemorrhoids: Secondary | ICD-10-CM

## 2018-01-19 DIAGNOSIS — G9341 Metabolic encephalopathy: Secondary | ICD-10-CM | POA: Clinically undetermined

## 2018-01-19 DIAGNOSIS — N183 Chronic kidney disease, stage 3 (moderate): Secondary | ICD-10-CM

## 2018-01-19 DIAGNOSIS — R451 Restlessness and agitation: Secondary | ICD-10-CM

## 2018-01-19 LAB — BLOOD GAS, ARTERIAL
Acid-base deficit: 0.6 mmol/L (ref 0.0–2.0)
Bicarbonate: 22.9 mmol/L (ref 20.0–28.0)
Drawn by: 331761
FIO2: 21
O2 Saturation: 96.4 %
Patient temperature: 97.8
pCO2 arterial: 32.9 mmHg (ref 32.0–48.0)
pH, Arterial: 7.455 — ABNORMAL HIGH (ref 7.350–7.450)
pO2, Arterial: 75.9 mmHg — ABNORMAL LOW (ref 83.0–108.0)

## 2018-01-19 LAB — TYPE AND SCREEN
ABO/RH(D): A POS
Antibody Screen: NEGATIVE
Unit division: 0
Unit division: 0
Unit division: 0
Unit division: 0
Unit division: 0

## 2018-01-19 LAB — BPAM RBC
Blood Product Expiration Date: 201910102359
Blood Product Expiration Date: 201910102359
Blood Product Expiration Date: 201910132359
Blood Product Expiration Date: 201910252359
Blood Product Expiration Date: 201910252359
ISSUE DATE / TIME: 201910031227
ISSUE DATE / TIME: 201910052122
ISSUE DATE / TIME: 201910060114
ISSUE DATE / TIME: 201910061451
ISSUE DATE / TIME: 201910061803
Unit Type and Rh: 6200
Unit Type and Rh: 6200
Unit Type and Rh: 6200
Unit Type and Rh: 6200
Unit Type and Rh: 6200

## 2018-01-19 LAB — HEMOGLOBIN AND HEMATOCRIT, BLOOD
HCT: 26.1 % — ABNORMAL LOW (ref 39.0–52.0)
HCT: 25.1 % — ABNORMAL LOW (ref 39.0–52.0)
HCT: 26.4 % — ABNORMAL LOW (ref 39.0–52.0)
Hemoglobin: 8.7 g/dL — ABNORMAL LOW (ref 13.0–17.0)
Hemoglobin: 8.2 g/dL — ABNORMAL LOW (ref 13.0–17.0)
Hemoglobin: 8.7 g/dL — ABNORMAL LOW (ref 13.0–17.0)

## 2018-01-19 LAB — BASIC METABOLIC PANEL
Anion gap: 8 (ref 5–15)
BUN: 50 mg/dL — ABNORMAL HIGH (ref 8–23)
CO2: 19 mmol/L — ABNORMAL LOW (ref 22–32)
Calcium: 8.1 mg/dL — ABNORMAL LOW (ref 8.9–10.3)
Chloride: 110 mmol/L (ref 98–111)
Creatinine, Ser: 1.61 mg/dL — ABNORMAL HIGH (ref 0.61–1.24)
GFR calc Af Amer: 49 mL/min — ABNORMAL LOW (ref >60)
GFR calc non Af Amer: 43 mL/min — ABNORMAL LOW (ref >60)
Glucose, Bld: 159 mg/dL — ABNORMAL HIGH (ref 70–99)
Potassium: 4.4 mmol/L (ref 3.5–5.1)
Sodium: 137 mmol/L (ref 135–145)

## 2018-01-19 LAB — CBC
HCT: 25.6 % — ABNORMAL LOW (ref 39.0–52.0)
Hemoglobin: 8.4 g/dL — ABNORMAL LOW (ref 13.0–17.0)
MCH: 27.6 pg (ref 26.0–34.0)
MCHC: 32.8 g/dL (ref 30.0–36.0)
MCV: 84.2 fL (ref 78.0–100.0)
Platelets: 168 10*3/uL (ref 150–400)
RBC: 3.04 MIL/uL — ABNORMAL LOW (ref 4.22–5.81)
RDW: 15.1 % (ref 11.5–15.5)
WBC: 13 10*3/uL — ABNORMAL HIGH (ref 4.0–10.5)

## 2018-01-19 LAB — GLUCOSE, CAPILLARY
Glucose-Capillary: 142 mg/dL — ABNORMAL HIGH (ref 70–99)
Glucose-Capillary: 143 mg/dL — ABNORMAL HIGH (ref 70–99)
Glucose-Capillary: 173 mg/dL — ABNORMAL HIGH (ref 70–99)
Glucose-Capillary: 127 mg/dL — ABNORMAL HIGH (ref 70–99)

## 2018-01-19 LAB — TSH: TSH: 1.137 u[IU]/mL (ref 0.350–4.500)

## 2018-01-19 LAB — HEPARIN LEVEL (UNFRACTIONATED): Heparin Unfractionated: <0.1 IU/mL — ABNORMAL LOW (ref 0.30–0.70)

## 2018-01-19 LAB — PROTIME-INR
INR: 1.62
Prothrombin Time: 19.1 seconds — ABNORMAL HIGH (ref 11.4–15.2)

## 2018-01-19 LAB — VITAMIN B12: Vitamin B-12: 891 pg/mL (ref 180–914)

## 2018-01-19 LAB — AMMONIA: Ammonia: 39 umol/L — ABNORMAL HIGH (ref 9–35)

## 2018-01-19 MED ORDER — HEPARIN (PORCINE) IN NACL 100-0.45 UNIT/ML-% IJ SOLN
850.0000 [IU]/h | INTRAMUSCULAR | Status: DC
  Administered 2018-01-19: 1250 [IU]/h via INTRAVENOUS
  Administered 2018-01-20: 1300 [IU]/h via INTRAVENOUS
  Administered 2018-01-21: 850 [IU]/h via INTRAVENOUS
  Filled 2018-01-19 (×3): qty 250

## 2018-01-19 MED ORDER — PANTOPRAZOLE SODIUM 40 MG PO TBEC
40.0000 mg | DELAYED_RELEASE_TABLET | Freq: Two times a day (BID) | ORAL | Status: DC
  Administered 2018-01-19 – 2018-01-22 (×6): 40 mg via ORAL
  Filled 2018-01-19 (×7): qty 1

## 2018-01-19 NOTE — Progress Notes (Signed)
ANTICOAGULATION CONSULT NOTE  Pharmacy Consult:  Heparin  Indication: Mechanical Valve (aortic)  Patient Measurements: Height: 6' (182.9 cm) Weight: 277 lb (125.6 kg) IBW/kg (Calculated) : 77.6  Heparin dosing wt: 105 kg  Vital Signs: Temp: 97.8 F (36.6 C) (10/07 0722) Temp Source: Axillary (10/07 0722) BP: 128/55 (10/07 0722) Pulse Rate: 96 (10/07 0722)  Labs: Recent Labs    01/17/18 0537  01/18/18 0334 01/18/18 0849 01/18/18 1319 01/18/18 2259 01/19/18 0606  HGB 8.5*   < > 6.6* 7.3* 6.8* 8.7* 8.4*  HCT 26.7*   < > 20.7* 22.8* 21.5* 26.1* 25.6*  PLT 179  --  199 183  --   --  168  LABPROT 20.6*  --  26.5*  --   --   --  19.1*  INR 1.79  --  2.47  --   --   --  1.62  HEPARINUNFRC  --   --  0.22*  --   --   --   --   CREATININE  --   --   --   --   --   --  1.61*   < > = values in this interval not displayed.    Estimated Creatinine Clearance: 61 mL/min (A) (by C-G formula based on SCr of 1.61 mg/dL (H)).    Assessment: 13 YOM presented with rectal pain and low hemoglobin on chronic Coumadin for mechanical heart valve.  Pharmacy consulted to continue Coumadin. Pt with concern for GIB. Pharmacy consulted to start IV heparin while holding warfarin with low target goal 0.3-0.5.  Heparin level this afternoon is SUBtherapeutic (HL<0.1, goal of 0.3-0.5). Hgb up to low 8's s/p 1 unit PRBC yesterday. Per RN report - IV site infiltrated and is in the process of being replaced. It is unclear if the patient was receiving the full heparin dose however the same drip rate resulted as low on 10/6 as well with a level of 0.22. Will increase the drip rate but not too aggressively.  Noted EGD done 10/6 which showed likely source of bleed as Mallory-Weiss tear. GI okayed restart warfarin on 10/7 if no further bleeding. The team, however, has asked pharmacy to wait to resume warfarin dosing on 10/8.    Goal of Therapy:  Heparin Level 0.3-0.5 units/ml INR 2-3  Monitor platelets by  anticoagulation protocol: Yes   Plan:  - Increase Heparin to 1450 units/hr (14.5 ml/hr) - Hold Warfarin dose today - will plan to start dosing on 10/8 - Will continue to monitor for any signs/symptoms of bleeding and will follow up with heparin level in 6 hours and PT/INR in the AM  Thank you for allowing pharmacy to be a part of this patient's care.  Alycia Rossetti, PharmD, BCPS Clinical Pharmacist Pager: [phone] Clinical phone for 01/19/2018 from 7a-3:30p: [phone] If after 3:30p, please call main pharmacy at: [phone] Please check AMION for all [place] numbers 01/19/2018 9:56 AM

## 2018-01-19 NOTE — Procedures (Addendum)
ELECTROENCEPHALOGRAM REPORT   Patient: Adam Savage       Room #: 8E28M EEG No. ID: [id number] Age: 67 y.o.        Sex: male Referring Physician: Heber [place] Report Date:  01/19/2018        Interpreting Physician: Alexis Goodell  History: Adam Savage is an 67 y.o. male with altered mental status  Medications:  Lipitor, Coreg, Insulin, Keppra, Remeron, Protonix, Seroquel, Flomax, Heparin, Senokot  Conditions of Recording:  This is a 21 channel routine scalp EEG performed with bipolar and monopolar montages arranged in accordance to the international 10/20 system of electrode placement. One channel was dedicated to EKG recording.  The patient is in the asleep state.  Description:  The background activity is asymmetric.  The left hemisphere is slow and poorly organized.  It consists of a low voltage polymorphic delta activity.  The right hemisphere is of higher voltage and consists of a mixture of frequencies that are poorly organized.  There is some embedded frontal sharp activity noted as well.  This asymmetry persists throughout the recording. Hyperventilation and intermittent photic stimulation were not performed.  IMPRESSION: This is an abnormal electroencephalogram secondary to a right breech rhythm and otherwise slow background rhythm  The breech rhythm is consistent with the patient's imaging.   Alexis Goodell, MD Neurology [phone] 01/19/2018, 7:02 PM

## 2018-01-19 NOTE — Progress Notes (Signed)
Internal Medicine Attending:   I saw and examined the patient. I reviewed the resident's note and I agree with the resident's findings and plan as documented in the resident's note.   On my evaluation patient was very adjatated,  He is oriented to self and with some prompting place (hospital, [place]) but not time or situation.  He has arm restraints in place. He occasionally falls alseep during encounter for about 5-10sec intervals.  No asterixis.  His daughter is also at bedside, additional history was obtained from her.  She notes she has had increasing difficulty with the care of her father.  Specifically she notes that he was very high functioning for most of his life.  ~2 years ago he underwent NeuroSx for his history of seizures and has had a progressive cognative decline.  She notes outpatient neurocognative testing has not been able to definatively catagorize this, however she specifically has notcied that his memory is worse after awaking from sleep.  He goes sometimes days without significant sleep and occasionally seem to sleep only short intervals.  She does not think that he has been very adherent to his antiepileptic medications.  A/P Acute blood loss anemia 2/2 GI blood loss from mallory weiss tear - GI following - PPI BID  Acute encephalopathy - Possibly acute delerium complicating underlying dementia however also appears to have some unusual chacteristis and concern for rapidly progressive dementia. - Continue supportive care for underlying acute illness -Dr Sydnee Levans spoke with Neurology Dr Rory Percy - Check B12, ammonia, TSH per neurology recommendations  - I do not think he will remain still for MRI brain, will obtain noncontrast CT of head per neuro recommendations - I would also obtain EEG to rule out non convulsive status - Delirum precautions, continue restrains for safety - Haldol PRN - Seroquel QHS - Trend EKG for QTc monitoring  Cryogloblunia, Leukocytoclastic vasculitis,  Chronic hepatitis C  Stage 3 CKD -stable - Will try to obtain US with elastography while here as he needs to start treatment for his cryo.  Hx AVR - Heparin per pharmacy

## 2018-01-19 NOTE — Care Management Note (Signed)
Case Management Note  Patient Details  Name: Camran Keady MRN: [id number] Date of Birth: 1950-11-14  Subjective/Objective:   Patient usually lives with daughter at home, but was just recently dischaged from [facility] on 9/30 per CSW note.  He presents with upper GIB, aS s/p mech valve replacemtn hx, encephalopathy, peranal and gluteal intertrijo, internal hemorrhoids, hep c. Pt rec SNF.                  Action/Plan: NCM will follow for transition of care needs.   Expected Discharge Date:  01/19/18               Expected Discharge Plan:  Skilled Nursing Facility  In-House Referral:  Clinical Social Work  Discharge planning Services  CM Consult  Post Acute Care Choice:    Choice offered to:     DME Arranged:    DME Agency:     HH Arranged:    [place] Agency:     Status of Service:  In process, will continue to follow  If discussed at Long Length of Stay Meetings, dates discussed:    Additional Comments:  Zenon Mayo, RN 01/19/2018, 10:14 AM
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
## 2018-01-19 NOTE — Progress Notes (Signed)
Daily Rounding Note  01/19/2018, 2:24 PM  LOS: 2 days   SUBJECTIVE:   Chief complaint: Melenic, dark, innumerable stools.    This morning the volume and frequency of stools is greatly reduced.  A few hours ago he had a smaller, still dark, stool.  He is eating and tolerating solid food.  Denies nausea, abdominal pain. IV heparin initiated at 8 AM today Required injections of Haldol this morning because of combative behavior, moving IVs and monitoring leads.  And confusion.  OBJECTIVE:         Vital signs in last 24 hours:    Temp:  [97.2 F (36.2 C)-98.5 F (36.9 C)] 97.2 F (36.2 C) (10/07 1204) Pulse Rate:  [72-98] 78 (10/07 1204) Resp:  [16-22] 21 (10/07 1204) BP: (80-128)/(46-76) 127/52 (10/07 1204) SpO2:  [95 %-100 %] 95 % (10/07 1204) Last BM Date: 01/19/18 Filed Weights   01/15/18 1838  Weight: 125.6 kg   General: Obese.  Somewhat somnolent but arousable for a brief period of time.  Looks chronically ill. Heart: RRR. Chest: No labored breathing or cough.  Clear to auscultation in front. Abdomen: Obese, protuberant.  Active bowel sounds.  Not tender or distended.  No organomegaly or masses. Extremities: Lower extremity edema. Neuro/Psych: Speech a little bit slurred.  Drowsy.  Follows commands.  Intake/Output from previous day: 10/06 0701 - 10/07 0700 In: 840 [P.O.:640; I.V.:200] Out: 1000 [Urine:1000]  Intake/Output this shift: Total I/O In: 340 [P.O.:340] Out: -   Lab Results: Recent Labs    01/18/18 0334 01/18/18 0849  01/18/18 2259 01/19/18 0606 01/19/18 1315  WBC 14.6* 17.2*  --   --  13.0*  --   HGB 6.6* 7.3*   < > 8.7* 8.4* 8.2*  HCT 20.7* 22.8*   < > 26.1* 25.6* 25.1*  PLT 199 183  --   --  168  --    < > = values in this interval not displayed.   BMET Recent Labs    01/19/18 0606  NA 137  K 4.4  CL 110  CO2 19*  GLUCOSE 159*  BUN 50*  CREATININE 1.61*  CALCIUM 8.1*    LFT No results for input(s): PROT, ALBUMIN, AST, ALT, ALKPHOS, BILITOT, BILIDIR, IBILI in the last 72 hours. PT/INR Recent Labs    01/18/18 0334 01/19/18 0606  LABPROT 26.5* 19.1*  INR 2.47 1.62   Hepatitis Panel No results for input(s): HEPBSAG, HCVAB, HEPAIGM, HEPBIGM in the last 72 hours.  Studies/Results: Dg Abd 1 View  Result Date: 01/17/2018 CLINICAL DATA:  Abdominal distension, diarrhea, recent rectal bleeding, hemorrhoids, history diabetes mellitus, COPD, history CHF EXAM: ABDOMEN - 1 VIEW COMPARISON:  None FINDINGS: Nonobstructive bowel gas pattern. No bowel dilatation or bowel wall thickening. Bones demineralized. No urine tract calcification. IMPRESSION: Normal bowel gas pattern. Electronically Signed   By: Lavonia Dana M.D.   On: 01/17/2018 16:43   Scheduled Meds: . atorvastatin  10 mg Oral Daily  . carvedilol  6.25 mg Oral BID WC  . clotrimazole  1 application Topical BID  . insulin aspart  0-15 Units Subcutaneous TID WC  . insulin aspart  0-5 Units Subcutaneous QHS  . insulin glargine  15 Units Subcutaneous QHS  . levETIRAcetam  1,000 mg Oral BID  . mirtazapine  30 mg Oral QHS  . nicotine  7 mg Transdermal Daily  . pantoprazole  40 mg Intravenous Q12H  . QUEtiapine  100 mg Oral QHS  .  senna-docusate  1 tablet Oral BID  . tamsulosin  0.4 mg Oral QHS   Continuous Infusions: . heparin 1,250 Units/hr (01/19/18 0814)   PRN Meds:.albuterol, haloperidol **OR** haloperidol lactate, hydrocortisone, ondansetron (ZOFRAN) IV, oxyCODONE-acetaminophen, witch hazel-glycerin  ASSESMENT:   *  Upper GI bleed, hematemesis, melena.  01/18/2018 EGD.  LA grade C esophagitis.  Nonbleeding MWT with stigmata of recent bleeding no VV.  Distal esophageal salmon-colored mucosa, R/oh short segment Barrett's.  Gastritis No signs of portal hypertension or varices.  *   Acute on chronic anemia due to GI blood loss.  S/P2 U PRBCs.  Hgb down a bit but relatively, overall stable.  MCV  normal.  *    Chronic hepatitis C. Previous interferon-based therapy 10 or more years ago (so 2009 or erlier).  HCV quant of 243,000 on 12/22/17 Fibrosis score F3-F4.  Dr Linus Salmons from [facility] is following.   He ordered outpt ultrasound, elastography, scheduled for tomorrow.   *   S/P mechanical AVR, on chronic Coumadin.  IV heparin started today.    *   AKI.  Baseline stage III CKD.  *    Sleep apnea.  *    Remote partial colon resection for unclear, undefined reason.   PLAN   *   Switch to oral Protonix 40 mg p.o. twice daily. Ok to resume Coumadin today.  Azucena Freed  01/19/2018, 2:24 PM Phone [phone]

## 2018-01-19 NOTE — Progress Notes (Signed)
Patient has been combative with care, kicking at and hitting at staff , pulling on lines and tubes. Sitter at bedside. Medicated with PRN haldol, did not sleep talked all night.

## 2018-01-19 NOTE — Progress Notes (Addendum)
Subjective: Patient was seen and evaluated at bedsite. He is agitated. Does answers some of the questions and denies pain. No vomiting per nurses.   Objective:  Vital signs in last 24 hours: Vitals:   01/19/18 0000 01/19/18 0536 01/19/18 0722 01/19/18 1204  BP: 92/76 103/60 (!) 128/55 (!) 127/52  Pulse: 85 98 96 78  Resp: (!) 22 19 20  (!) 21  Temp: 98.4 F (36.9 C) 97.8 F (36.6 C) 97.8 F (36.6 C) (!) 97.2 F (36.2 C)  TempSrc: Axillary Axillary Axillary Axillary  SpO2: 98% 100% 99% 95%  Weight:      Height:       Physical Exam:  Restrain Fluctuating mental status. Currently alert but falls sleep during interview and exam. Is agitated. Is not completely oriented to place. (knows the city but not the hospital). Orineted to person and times? Moves all extremities. CV: mechanical valve sound.  Lungs: CTA bilateraly. No wheeze, no rale.  Extremities: No lower extremity edema. Mild diffuse rashes. (Improved than previous day)  Assessment/Plan:  Principal Problem:   Mallory-Weiss tear Active Problems:   Chronic anticoagulation   Internal hemorrhoid, bleeding   Perianal infection   Blood loss anemia   S/P AVR (aortic valve replacement) [name] is a 67 y.o male with a history of AS s/p mechanical valve replacement on Warfarin, epilepsy s/p temporal lobectomy, and DM who presented to the ED with worsening rectal pain and bleeding. He was doing well until 10/05 when he subsequently developed acute upper GI bleed as outlined below. Also complicated with altered mental status Altered mental status, agitation. He is restrained. Worsened today. Has fluctuating memory loss with decreased baseline orientation today. Mostly o ccurs after few minutes of sleep. Delirium vs seizure, encephalopathy -B12 level -Ammonia -TSH -EEG -Head Ct scan w/o contrast (Mechanical valve MRI compatibility was not clear) --> IMPRESSION: Status post right temporal lobectomy with  encephalomalacia throughout this area. There is mild periventricular small vessel disease. No acute infarct. No mass or hemorrhage.  There are foci of arterial vascular calcification. Extensive post craniotomy bone defects on the right. Extensive paranasal sinus disease on the left. 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 There is rightward deviation of the nasal septum. -Neurology consulted. Asked to call them back Waiting for test results. ---> Contacted neurology again and let them know the head Ct is ready. Told that he will see the patient probably tomorrow. -1:1 sitter  Upper GI Bleed  With Hx of Hep C  No more GI bleeding. Gi is on board. Warfarin is held and he is on IV hep. Hb stable.  8.7 today. - Continue Pantoprazole 40 mg BID  - Continue Octreotide 50 mcg/hr  - Continue Ceftriaxone for SBP prophylaxis  - H&H. Transfusion goal>7 - Continue heparin drip for mechanical aortic valve, if further decompensation may need to stop it.  -Avoid NSAIDs -may bridge to Warfarin tomorrow if no more bleeding   Seizure: On KEppra 1000 mg BID. His current fluctuating cognitive dysfunction and episodes of confusion may be 2/2 seizure.  -Continue with Keppra -EEG -f/u with neurology consult tomorrow  AS s/p mechanical valve replacement  - Previously on Warfarin, discontinued after patient experienced hematemesis. Started on heparin drip per pharmacy.  Will need bridged back to Warfarin after this acute event, probably tomorrow.    Perianal and gluteal intertrigo  - Patient states that the pain is improved compared to prior  - Continuingclotrimazole creamBID  Internal Hemorrhoids  - Continue hydrocortisone suppositories   Hepatitis C Palpable Purpura  - Patient with progression of his bilateral LE palpable purpura - Previous biopsy showing leukocytoclastic vasculitis  - Need to have his hepatitis treated which  would help with the cryo + vasculitis  - Outpatient follow-up with ID -F/u Ammonia result    Dispo: Anticipated discharge in  [place], MD 01/19/2018, 4:33 PM Pager: [phone]

## 2018-01-19 NOTE — Progress Notes (Signed)
EEG completed; results pending.    

## 2018-01-19 NOTE — Progress Notes (Signed)
ANTICOAGULATION CONSULT NOTE  Pharmacy Consult:  Coumadin >> Heparin Indication: Mechanical Valve (aortic)  Allergies  Allergen Reactions  . Morphine And Related Other (See Comments)    Combative     Patient Measurements: Height: 6' (182.9 cm) Weight: 277 lb (125.6 kg) IBW/kg (Calculated) : 77.6  Heparin dosing wt: 105 kg  Vital Signs: Temp: 97.8 F (36.6 C) (10/07 0722) Temp Source: Axillary (10/07 0722) BP: 128/55 (10/07 0722) Pulse Rate: 96 (10/07 0722)  Labs: Recent Labs    01/17/18 0537  01/18/18 0334 01/18/18 0849 01/18/18 1319 01/18/18 2259 01/19/18 0606  HGB 8.5*   < > 6.6* 7.3* 6.8* 8.7* 8.4*  HCT 26.7*   < > 20.7* 22.8* 21.5* 26.1* 25.6*  PLT 179  --  199 183  --   --  168  LABPROT 20.6*  --  26.5*  --   --   --  19.1*  INR 1.79  --  2.47  --   --   --  1.62  HEPARINUNFRC  --   --  0.22*  --   --   --   --    < > = values in this interval not displayed.    Estimated Creatinine Clearance: 57.7 mL/min (A) (by C-G formula based on SCr of 1.7 mg/dL (H)).    Assessment: 58 YOM presented with rectal pain and low hemoglobin on chronic Coumadin for mechanical heart valve.  Pharmacy consulted to continue Coumadin. Pt with concern for GIB. Pharmacy consulted to start IV heparin while holding warfarin with low target goal 0.3-0.5. Heparin was held while INR >2.0.  INR now 1.62.     Goal of Therapy:  Heparin Level 0.3-0.5 units/ml INR 2-3  Monitor platelets by anticoagulation protocol: Yes    Plan:  -Restart heparin drip at 1250 units/hr -Check heparin level 6 hours after start -Daily HL and CBC while on heparin  Thanks for allowing pharmacy to be a part of this patient's care.  Excell Seltzer, PharmD Clinical Pharmacist

## 2018-01-19 NOTE — Progress Notes (Signed)
Discussed patient's mechanical valve with MRI tech; he states would be compatible with our MRI machines. Placed order for MRI wo contrast per Dr. Johny Chess recs.  Alphonzo Grieve, MD IMTS - PGY3

## 2018-01-19 NOTE — Telephone Encounter (Signed)
Per Dr. Linus Salmons faxed office visit from 01/12/2018 to [facility] in [place], [place].

## 2018-01-20 ENCOUNTER — Inpatient Hospital Stay (HOSPITAL_COMMUNITY): Payer: Medicare Other

## 2018-01-20 ENCOUNTER — Other Ambulatory Visit: Payer: Self-pay

## 2018-01-20 ENCOUNTER — Ambulatory Visit (HOSPITAL_COMMUNITY): Payer: Medicare Other | Attending: Internal Medicine

## 2018-01-20 DIAGNOSIS — F0281 Dementia in other diseases classified elsewhere with behavioral disturbance: Secondary | ICD-10-CM | POA: Diagnosis present

## 2018-01-20 DIAGNOSIS — F0391 Unspecified dementia with behavioral disturbance: Secondary | ICD-10-CM

## 2018-01-20 DIAGNOSIS — B1920 Unspecified viral hepatitis C without hepatic coma: Secondary | ICD-10-CM

## 2018-01-20 DIAGNOSIS — K210 Gastro-esophageal reflux disease with esophagitis: Secondary | ICD-10-CM

## 2018-01-20 DIAGNOSIS — F02818 Dementia in other diseases classified elsewhere, unspecified severity, with other behavioral disturbance: Secondary | ICD-10-CM | POA: Diagnosis present

## 2018-01-20 DIAGNOSIS — F05 Delirium due to known physiological condition: Secondary | ICD-10-CM

## 2018-01-20 LAB — HEMOGLOBIN AND HEMATOCRIT, BLOOD
HCT: 25.1 % — ABNORMAL LOW (ref 39.0–52.0)
HCT: 26.6 % — ABNORMAL LOW (ref 39.0–52.0)
Hemoglobin: 8.4 g/dL — ABNORMAL LOW (ref 13.0–17.0)
Hemoglobin: 8.8 g/dL — ABNORMAL LOW (ref 13.0–17.0)

## 2018-01-20 LAB — CBC
HCT: 25.3 % — ABNORMAL LOW (ref 39.0–52.0)
Hemoglobin: 8.3 g/dL — ABNORMAL LOW (ref 13.0–17.0)
MCH: 28 pg (ref 26.0–34.0)
MCHC: 32.8 g/dL (ref 30.0–36.0)
MCV: 85.5 fL (ref 80.0–100.0)
Platelets: 150 10*3/uL (ref 150–400)
RBC: 2.96 MIL/uL — ABNORMAL LOW (ref 4.22–5.81)
RDW: 15.7 % — ABNORMAL HIGH (ref 11.5–15.5)
WBC: 10.6 10*3/uL — ABNORMAL HIGH (ref 4.0–10.5)

## 2018-01-20 LAB — HEPARIN LEVEL (UNFRACTIONATED)
Heparin Unfractionated: 0.69 IU/mL (ref 0.30–0.70)
Heparin Unfractionated: 0.93 IU/mL — ABNORMAL HIGH (ref 0.30–0.70)
Heparin Unfractionated: 0.89 IU/mL — ABNORMAL HIGH (ref 0.30–0.70)

## 2018-01-20 LAB — GLUCOSE, CAPILLARY
Glucose-Capillary: 103 mg/dL — ABNORMAL HIGH (ref 70–99)
Glucose-Capillary: 123 mg/dL — ABNORMAL HIGH (ref 70–99)
Glucose-Capillary: 144 mg/dL — ABNORMAL HIGH (ref 70–99)
Glucose-Capillary: 131 mg/dL — ABNORMAL HIGH (ref 70–99)

## 2018-01-20 LAB — PROTIME-INR
INR: 1.63
Prothrombin Time: 19.2 seconds — ABNORMAL HIGH (ref 11.4–15.2)

## 2018-01-20 MED ORDER — WARFARIN - PHARMACIST DOSING INPATIENT: Freq: Every day | Status: DC

## 2018-01-20 MED ORDER — WARFARIN SODIUM 7.5 MG PO TABS
7.5000 mg | ORAL_TABLET | Freq: Once | ORAL | Status: AC
  Administered 2018-01-20: 7.5 mg via ORAL
  Filled 2018-01-20: qty 1

## 2018-01-20 MED ORDER — LORAZEPAM 2 MG/ML IJ SOLN
1.0000 mg | Freq: Once | INTRAMUSCULAR | Status: AC | PRN
  Administered 2018-01-20: 2 mg via INTRAVENOUS
  Filled 2018-01-20: qty 1

## 2018-01-20 MED ORDER — LORAZEPAM 2 MG/ML IJ SOLN
1.0000 mg | Freq: Once | INTRAMUSCULAR | Status: AC
  Administered 2018-01-20: 1 mg via INTRAVENOUS

## 2018-01-20 NOTE — Progress Notes (Signed)
ANTICOAGULATION CONSULT NOTE - Follow Up Consult  Pharmacy Consult for heparin Indication: AVR  Labs: Recent Labs    01/18/18 0334 01/18/18 0849  01/19/18 0606 01/19/18 1315 01/19/18 2113 01/20/18 0146  HGB 6.6* 7.3*   < > 8.4* 8.2* 8.7* 8.3*  HCT 20.7* 22.8*   < > 25.6* 25.1* 26.4* 25.3*  PLT 199 183  --  168  --   --  150  LABPROT 26.5*  --   --  19.1*  --   --  19.2*  INR 2.47  --   --  1.62  --   --  1.63  HEPARINUNFRC 0.22*  --   --   --  <0.10*  --  0.69  CREATININE  --   --   --  1.61*  --   --   --    < > = values in this interval not displayed.    Assessment: 67yo male npw supratherapeutic on heparin after rate increase; RN notes no gtt issues or overt signs of bleeding.  Goal of Therapy:  Heparin level 0.3-0.5 units/ml   Plan:  Will decrease heparin gtt by 1-2 units/kg/hr to 1300 units/hr and check level in 6 hours.    Wynona Neat, PharmD, BCPS  01/20/2018,3:38 AM

## 2018-01-20 NOTE — Care Management Important Message (Signed)
Important Message  Patient Details  Name: Adam Savage MRN: [id number] Date of Birth: 30-Nov-1950   Medicare Important Message Given:  Yes    Eureka Valdes 01/20/2018, 2:13 PM

## 2018-01-20 NOTE — Progress Notes (Signed)
Attempted to obtain pt MRI. Pt came to department would not hold still, started taking his gown off and twisting and turning on table. Could not get exam RN was in our department and aware of pt behavior.

## 2018-01-20 NOTE — Progress Notes (Signed)
Neurology Progress Note   S:// Patient seen and examined. Will be going for an MRI  O:// Current vital signs: BP 90/65   Pulse 77   Temp 97.7 F (36.5 C) (Axillary)   Resp 20   Ht 6' (1.829 m)   Wt 125.6 kg   SpO2 100%   BMI 37.57 kg/m  Vital signs in last 24 hours: Temp:  [96.5 F (35.8 C)-98 F (36.7 C)] 97.7 F (36.5 C) (10/08 1117) Pulse Rate:  [68-87] 77 (10/08 1117) Resp:  [16-22] 20 (10/08 1117) BP: (90-127)/(52-70) 90/65 (10/08 1117) SpO2:  [95 %-100 %] 100 % (10/08 1117) General: Well-developed well-nourished in no distress H ENT: Normocephalic atraumatic with palpable surgical site on the right skull from the old lobectomy Cardia vascular: S1-S2 heard, irregularly irregular Abdomen: Soft nondistended nontender Extremities: Warm well perfused Neurological exam He is a little more drowsy than yesterday, but easily arousable and becomes alert oriented to self and the fact that he is in the hospital.  Today he was able to tell me that he is in [place] but could not tell me the name of the hospital. He was able to tell me the year 2019 but got the month wrong-September. Her speech is mildly dysarthric. Naming, repetition fluency and comprehension are all intact. Cranial nerves: Pupils are equal round reactive light extraocular movements intact, visual fields full, no facial asymmetry, facial sensation intact, hearing intact, uvula midline, tongue midline, shoulder shrug intact. Motor exam: Nearly symmetric 5/5 strength in both upper extremities and some of her left hand and weakness in both lower extremities and did not cooperate with lower extremity examination as he did not want to change the position that he was laying in. Sensory exam is intact light touch without extinction DTRs are 1+ throughout, downgoing patellar's Gait testing was deferred at this time Cerebellar exam with no evidence of ataxia on finger-nose-finger.  Medications  Current  Facility-Administered Medications:  .  albuterol (PROVENTIL) (2.5 MG/3ML) 0.083% nebulizer solution 2.5 mg, 2.5 mg, Nebulization, Q6H PRN, Joni Reining C, DO, 2.5 mg at 01/17/18 0524 .  atorvastatin (LIPITOR) tablet 10 mg, 10 mg, Oral, Daily, Helberg, Justin, MD, 10 mg at 01/20/18 0932 .  carvedilol (COREG) tablet 6.25 mg, 6.25 mg, Oral, BID WC, Helberg, Justin, MD, 6.25 mg at 01/20/18 0931 .  clotrimazole (LOTRIMIN) 1 % cream 1 application, 1 application, Topical, BID, Helberg, Justin, MD, 1 application at 69/67/89 0934 .  haloperidol (HALDOL) tablet 2 mg, 2 mg, Oral, Q6H PRN, 2 mg at 01/19/18 0800 **OR** haloperidol lactate (HALDOL) injection 2 mg, 2 mg, Intramuscular, Q6H PRN, [name], MD, 2 mg at 01/20/18 0943 .  heparin ADULT infusion 100 units/mL (25000 units/231mL sodium chloride 0.45%), 1,100 Units/hr, Intravenous, Continuous, Joselyn Glassman A, RPH, Last Rate: 13 mL/hr at 01/20/18 0610, 1,300 Units/hr at 01/20/18 0610 .  hydrocortisone (ANUSOL-HC) suppository 25 mg, 25 mg, Rectal, BID PRN, Helberg, Justin, MD .  insulin aspart (novoLOG) injection 0-15 Units, 0-15 Units, Subcutaneous, TID WC, Ina Homes, MD, 3 Units at 01/19/18 1748 .  insulin aspart (novoLOG) injection 0-5 Units, 0-5 Units, Subcutaneous, QHS, Ina Homes, MD, 3 Units at 01/16/18 2134 .  insulin glargine (LANTUS) injection 15 Units, 15 Units, Subcutaneous, QHS, Ina Homes, MD, 15 Units at 01/19/18 2253 .  levETIRAcetam (KEPPRA) tablet 1,000 mg, 1,000 mg, Oral, BID, Helberg, Justin, MD, 1,000 mg at 01/20/18 0956 .  LORazepam (ATIVAN) injection 1 mg, 1 mg, Intravenous, Once, Hoffman, Erik C, DO .  LORazepam (ATIVAN) injection 1-2 mg, 1-2 mg, Intravenous, Once PRN, Joni Reining C, DO .  mirtazapine (REMERON) tablet 30 mg, 30 mg, Oral, QHS, [name], MD, 30 mg at 01/19/18 2240 .  nicotine (NICODERM CQ - dosed in mg/24 hr) patch 7 mg, 7 mg, Transdermal, Daily, [name], MD, 7 mg at 01/20/18 0942 .   ondansetron (ZOFRAN) injection 4 mg, 4 mg, Intravenous, Q6H PRN, Ina Homes, MD, 4 mg at 01/17/18 1949 .  oxyCODONE-acetaminophen (PERCOCET/ROXICET) 5-325 MG per tablet 1 tablet, 1 tablet, Oral, Q6H PRN, Alphonzo Grieve, MD, 1 tablet at 01/20/18 0358 .  pantoprazole (PROTONIX) EC tablet 40 mg, 40 mg, Oral, BID, Vena Rua, PA-C, 40 mg at 01/20/18 0932 .  QUEtiapine (SEROQUEL) tablet 100 mg, 100 mg, Oral, QHS, Akintayo, Mojeed, MD, 100 mg at 01/19/18 2245 .  senna-docusate (Senokot-S) tablet 1 tablet, 1 tablet, Oral, BID, Ina Homes, MD, 1 tablet at 01/20/18 0932 .  tamsulosin (FLOMAX) capsule 0.4 mg, 0.4 mg, Oral, QHS, Helberg, Justin, MD, 0.4 mg at 01/19/18 2240 .  witch hazel-glycerin (TUCKS) pad, , Topical, PRN, Carroll Sage, MD Labs CBC    Component Value Date/Time   WBC 10.6 (H) 01/20/2018 0146   RBC 2.96 (L) 01/20/2018 0146   HGB 8.3 (L) 01/20/2018 0146   HCT 25.3 (L) 01/20/2018 0146   PLT 150 01/20/2018 0146   MCV 85.5 01/20/2018 0146   MCH 28.0 01/20/2018 0146   MCHC 32.8 01/20/2018 0146   RDW 15.7 (H) 01/20/2018 0146   LYMPHSABS 1.1 12/21/2017 1953   MONOABS 0.5 12/21/2017 1953   EOSABS 0.1 12/21/2017 1953   BASOSABS 0.0 12/21/2017 1953    CMP     Component Value Date/Time   NA 137 01/19/2018 0606   NA 136 01/14/2018 1209   K 4.4 01/19/2018 0606   CL 110 01/19/2018 0606   CO2 19 (L) 01/19/2018 0606   GLUCOSE 159 (H) 01/19/2018 0606   BUN 50 (H) 01/19/2018 0606   BUN 36 (H) 01/14/2018 1209   CREATININE 1.61 (H) 01/19/2018 0606   CALCIUM 8.1 (L) 01/19/2018 0606   PROT 6.4 (L) 01/16/2018 0550   ALBUMIN 2.7 (L) 01/16/2018 0550   AST 45 (H) 01/16/2018 0550   ALT 34 01/16/2018 0550   ALT 31 01/12/2018 1135   ALKPHOS 139 (H) 01/16/2018 0550   BILITOT 0.8 01/16/2018 0550   GFRNONAA 43 (L) 01/19/2018 0606   GFRAA 49 (L) 01/19/2018 0606   Imaging I have reviewed images in epic and the results pertinent to this consultation are: CT-scan of the  brain-encephalomalacia from the right temporal lobectomy.  No acute changes.    MRI examination of the brain is been ordered and is pending at this time.  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  Neurology service was consulted because of the family's concern for rapid decline in his cognition and more acute decline over the past few weeks. According to the daughter his cognitive decline was initially just related to attention and memory but lately has become more problematic because of increasing aggression. I suspect that there is underlying degree of cognitive impairment that is greater than the expected for his age because of tissue loss due  to temporal lobectomy.  And acutely he is probably demonstrating an acute delirium but he is probably developing rapidly progressive dementia. Reversible and manageable conditions such as strokes should be ruled out with further imaging.  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 Less likely to be subclinical seizures given unremarkable EEG findings  Recommendations: MRI brain without contrast Minimize sedating medications as you are Patient lives at home with his daughter and the daughter is in kids, and given his rapid deterioration in cognition, might not be safe for him to be living in the home setting. I have discussed my plan with the primary team and the patient's daughter on the unit.  I will follow-up on the MRI results as they become  available.  -- Amie Portland, MD [facility] Pager: [phone] If 7pm to 7am, please call on call as listed on AMION.

## 2018-01-20 NOTE — Progress Notes (Signed)
ANTICOAGULATION CONSULT NOTE - Follow Up Consult  Pharmacy Consult for heparin Indication: AVR  Labs: Recent Labs    01/18/18 0334 01/18/18 0849  01/19/18 0606 01/19/18 1315 01/19/18 2113 01/20/18 0146 01/20/18 0914  HGB 6.6* 7.3*   < > 8.4* 8.2* 8.7* 8.3*  --   HCT 20.7* 22.8*   < > 25.6* 25.1* 26.4* 25.3*  --   PLT 199 183  --  168  --   --  150  --   LABPROT 26.5*  --   --  19.1*  --   --  19.2*  --   INR 2.47  --   --  1.62  --   --  1.63  --   HEPARINUNFRC 0.22*  --   --   --  <0.10*  --  0.69 0.93*  CREATININE  --   --   --  1.61*  --   --   --   --    < > = values in this interval not displayed.    Assessment: 67yo male now supratherapeutic at 0.93 on heparin despite rate decrease; RN notes no gtt issues. Patient with black stool this AM. Heparin dosing weight 105 kg  Goal of Therapy:  Heparin level 0.3-0.5 units/ml   Plan:  Hold heparin 30 min Will decrease heparin gtt by 2 units/kg/hr to 1100 units/hr and check level in 6 hours.    Adam Savage A. Levada Dy, PharmD, [place] Pager: [phone] Please utilize Amion for appropriate phone number to reach the unit pharmacist ([place])   01/20/2018,11:12 AM

## 2018-01-20 NOTE — Progress Notes (Signed)
PT Cancellation Note  Patient Details Name: Seraj Dunnam MRN: [id number] DOB: Nov 14, 1950   Cancelled Treatment:    Reason Eval/Treat Not Completed: Other (comment) RN requesting to hold PT for today, as pt with recent increase in agitation and supposed to be going for MRI soon. Will follow up as schedule allows.   Leighton Ruff, PT, DPT  Acute Rehabilitation Services  Pager: [phone] Office: [phone]  Rudean Hitt 01/20/2018, 12:15 PM

## 2018-01-20 NOTE — Progress Notes (Signed)
Subjective: Patient was seen and evaluated at bedside on morning and then during the rounds. He is calm, eating his breakfast, communicates much better. Denies any pain.   -Second visit during the round: He was asleep but woke up when we called him. pleasant.  Has no complaint. Daughter and neurology team were around and to discuss his situation with them before visiting the patient.  Objective:  Vital signs in last 24 hours: Vitals:   01/19/18 1931 01/19/18 2301 01/20/18 0332 01/20/18 0357  BP: (!) 90/53 (!) 94/59 (!) 98/54 110/70  Pulse: 68 72 75   Resp: (!) 21 16 (!) 22   Temp: (!) 97.5 F (36.4 C) 98 F (36.7 C) 97.9 F (36.6 C)   TempSrc: Axillary Oral Oral   SpO2: 98% 99% 99%   Weight:      Height:       General: Alert, fluctuating orientation. (Last visit he was oriented to place and some people) CV: Mechanical valve sound. No murmur. Lungs: are CTA bilaterally. No rale, wheeze Abdomen: Soft and non-tender. BS is present. Extremities: rashes/puprura. No LEE. Peripheral pulses are nl bilaterally.  Neurologic exam: Alert. Oriented to place and some people. No motor deficit.   EEG result: This is an abnormal electroencephalogram secondary to a right breech rhythm and otherwise slow background rhythm.The breech rhythm is consistent with the patient's imaging.   Head CT result: Status post right temporal lobectomy with encephalomalacia throughout this area. There is mild periventricular small vessel disease. No acute infarct. No mass or hemorrhage. There are foci of arterial vascular calcification. Extensive post craniotomy bone defects on the right. Extensive paranasal sinus disease on the left. 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 There is rightward deviation of the nasal septum.    Assessment/Plan:  Principal Problem:   Mallory-Weiss tear Active Problems:   Seizure disorder (HCC)   Chronic  anticoagulation   Internal hemorrhoid, bleeding   Perianal infection   Blood loss anemia   S/P AVR (aortic valve replacement)   Acute encephalopathy   [name] is a 67 y.o male with a history of AS s/p mechanical valve replacement on Warfarin, epilepsy s/p temporal lobectomy, and DM who presented to the ED with worsening rectal pain and bleeding.He was doing well until 10/05 when he subsequently developed acute upper GI bleed as outlined below. Also complicated with altered mental status  S/P AVR (aortic valve replacement): Was on warfarin. Stopped due to hematemesis. Currently on Hep drip. Per GI note today, safe to bridge to Warfarin.  Altered mental status, agitation: with Hx of temporal lobectomy. Improved today. He is alert and more oriented. Not combative today. Ammonia mildly elevated. But less likely to be the cause of alterd mental status. Neurology is on board. TSH, B12 came back normal. Head CT scan without contrast showed right temporal lobectomy with encephalomalacia throughout this area. There is mild periventricular small vessel disease. No acute infarct. No mass or hemorrhage.   Progressive memory and cognitive impairment is likely 2/2 temporal lobectomy. Neurology is on board. MRI recommended (Mechanical valve is MRI compatibe)  -MRI without contrast. (Per neurology: 1 mg IV Ativan before that and 1 mg PRN for agitation prior to MRI) -C/W Seroquel   Upper GI Bleed: reflux esophagitis with possible short-segment Barrett's. Nonbleeding MW tear with stigmata of recent bleeding. With Hx of Hep C. HD stable. GI is on board. Warfarin is held and he is on IV hep.  Hb stable and 8.3 today. Per GI, ok to resume Warfarin. And plan to f/u outpatient. Will take benefit of repeating EGD in 6 months given possible Dx of Barrett's.  - Continue Pantoprazole 40 mg BID for 8 weeks per GI - Continue Octreotide 50 mcg/hr  - Continue Ceftriaxone for SBP prophylaxis  - H&H.  Transfusion goal>7 -Avoid NSAIDs   Hx of Seizure:  On Keppra 1000 mg BID.  EEG showed some abnormality related to temporal lobectomy but did not show any seizure activity.  Neurology is on board.  -Continue with Keppra -No current seizure but be causious for usisng Haldol and Mirtazapin as those decrease seizure threshold  AS s/p mechanical valve replacement  -Previously on Warfarin, discontinued after patient experienced hematemesis. Started on heparin drip. Ok to resume Warfarin per GI.  -Bridge to Warfarin per pharmacy   Perianal and gluteal intertrigo .  Does not complain of any pain. - Continuingclotrimazole creamBID  Internal Hemorrhoids: No major bleeding. - Continue hydrocortisone suppositories   Hepatitis C: Mild elevation in Ammonia. Les likely to be the cause of cognitive impairment.  - Outpatient follow-up with ID -Liver U/S and elastography today  Dispo: Anticipated discharge after clinical improvement and stability and establishing after discharge plan of care   Dewayne Hatch, MD 01/20/2018, 7:14 AM Pager: [phone]

## 2018-01-20 NOTE — Progress Notes (Addendum)
ANTICOAGULATION CONSULT NOTE  Pharmacy Consult:  Heparin/coumadin Indication: Mechanical Valve (aortic)  Patient Measurements: Height: 6' (182.9 cm) Weight: 277 lb (125.6 kg) IBW/kg (Calculated) : 77.6  Heparin dosing wt: 105 kg  Vital Signs: Temp: 97.7 F (36.5 C) (10/08 1117) Temp Source: Axillary (10/08 1117) BP: 108/55 (10/08 1201) Pulse Rate: 77 (10/08 1117)  Labs: Recent Labs    01/18/18 0334 01/18/18 0849  01/19/18 0606 01/19/18 1315 01/19/18 2113 01/20/18 0146 01/20/18 0914 01/20/18 1225  HGB 6.6* 7.3*   < > 8.4* 8.2* 8.7* 8.3*  --  8.4*  HCT 20.7* 22.8*   < > 25.6* 25.1* 26.4* 25.3*  --  25.1*  PLT 199 183  --  168  --   --  150  --   --   LABPROT 26.5*  --   --  19.1*  --   --  19.2*  --   --   INR 2.47  --   --  1.62  --   --  1.63  --   --   HEPARINUNFRC 0.22*  --   --   --  <0.10*  --  0.69 0.93*  --   CREATININE  --   --   --  1.61*  --   --   --   --   --    < > = values in this interval not displayed.    Estimated Creatinine Clearance: 61 mL/min (A) (by C-G formula based on SCr of 1.61 mg/dL (H)).    Assessment: 54 YOM presented with rectal pain and low hemoglobin on chronic Coumadin for mechanical heart valve.  Pharmacy consulted to continue Coumadin. Pt with concern for GIB. Pharmacy consulted to start IV heparin while holding warfarin with low target goal 0.3-0.5.  Noted EGD done 10/6 which showed likely source of bleed as Mallory-Weiss tear. GI okayed restart warfarin on 10/7 if no further bleeding. The team, however, has asked pharmacy to wait to resume warfarin dosing on 10/8. Ok to resume coumadin today. INR 1.63   PTA coumadin: 5mg  qday except 7.5mg  Thurs/Sun  Goal of Therapy:  Heparin Level 0.3-0.5 units/ml INR 2-3  Monitor platelets by anticoagulation protocol: Yes   Plan:   Hold heparin x 1hr Decrease heparin 900 units/hr Daily level Coumadin 7.5mg  PO x1 Daily INR  Onnie Boer, PharmD, [place], AAHIVP, CPP Infectious Disease  Pharmacist Pager: [phone] 01/20/2018 3:27 PM  Addendum:  Heparin level still came elevated this PM. We will hold drip again and restart at lower dose. Level in AM.   Onnie Boer, PharmD, BCIDP, AAHIVP, CPP Infectious Disease Pharmacist Pager: [phone] 01/20/2018 8:09 PM

## 2018-01-20 NOTE — Progress Notes (Signed)
Daily Rounding Note  01/20/2018, 8:35 AM  LOS: 3 days   SUBJECTIVE:   Chief complaint: melena  No stools reported yesterday.  Denies abd pain, nausea.   Less agitated this AM, soft restraints untied.  OBJECTIVE:         Vital signs in last 24 hours:    Temp:  [96.5 F (35.8 C)-98 F (36.7 C)] 96.5 F (35.8 C) (10/08 0801) Pulse Rate:  [68-87] 87 (10/08 0801) Resp:  [16-22] 18 (10/08 0801) BP: (90-127)/(52-70) 121/61 (10/08 0801) SpO2:  [95 %-100 %] 100 % (10/08 0801) Last BM Date: 01/19/18 Filed Weights   01/15/18 1838  Weight: 125.6 kg   General: resting comfortably.  Looks unwell Heart: RRR Chest: clear bil.  No SOB no cough Abdomen: soft, NT, ND.  Active BS  Extremities: no CCE Neuro/Psych:  Alert.  No tremors. Still confused but answers appropriately.  Oriented only to self.    Intake/Output from previous day: 10/07 0701 - 10/08 0700 In: 710.4 [P.O.:580; I.V.:130.4] Out: 425 [Urine:425]  Intake/Output this shift: Total I/O In: 240 [P.O.:240] Out: -   Lab Results: Recent Labs    01/18/18 0849  01/19/18 0606 01/19/18 1315 01/19/18 2113 01/20/18 0146  WBC 17.2*  --  13.0*  --   --  10.6*  HGB 7.3*   < > 8.4* 8.2* 8.7* 8.3*  HCT 22.8*   < > 25.6* 25.1* 26.4* 25.3*  PLT 183  --  168  --   --  150   < > = values in this interval not displayed.   BMET Recent Labs    01/19/18 0606  NA 137  K 4.4  CL 110  CO2 19*  GLUCOSE 159*  BUN 50*  CREATININE 1.61*  CALCIUM 8.1*   LFT No results for input(s): PROT, ALBUMIN, AST, ALT, ALKPHOS, BILITOT, BILIDIR, IBILI in the last 72 hours. PT/INR Recent Labs    01/19/18 0606 01/20/18 0146  LABPROT 19.1* 19.2*  INR 1.62 1.63   Hepatitis Panel No results for input(s): HEPBSAG, HCVAB, HEPAIGM, HEPBIGM in the last 72 hours.  Studies/Results: Ct Head Wo Contrast  Result Date: 01/19/2018 CLINICAL DATA:  Cognitive dysfunction/altered mental  status. History of seizures EXAM: CT HEAD WITHOUT CONTRAST TECHNIQUE: Contiguous axial images were obtained from the base of the skull through the vertex without intravenous contrast. COMPARISON:  December 29, 2017 FINDINGS: Brain: The ventricles are normal in size and configuration. The patient has had a previous right temporal lobectomy with encephalomalacia throughout the right temporal region. There is no mass, hemorrhage, subdural or epidural fluid collection, or midline shift. There is mild small vessel disease in the centra semiovale bilaterally. No acute infarct is evident. Vascular: There is no appreciable hyperdense vessel. There is calcification in each distal vertebral artery in each carotid siphon region. Skull: Patient has had extensive right-sided craniotomies with several craniotomy defects. Bony calvarium otherwise appears intact. Sinuses/Orbits: There is opacification throughout the left maxillary antrum. 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 There is opacification of several anterior ethmoid air cells. There is rightward deviation of the nasal septum. Orbits appear symmetric bilaterally. Other: Mastoid air cells are clear. IMPRESSION: Status post right temporal lobectomy with encephalomalacia throughout this area. There is mild periventricular small vessel disease. No acute infarct. No mass or hemorrhage. There are foci of arterial vascular calcification. Extensive post craniotomy bone defects on  the right. Extensive paranasal sinus disease on the left. 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 There is rightward deviation of the nasal septum. Electronically Signed   By: Lowella Grip III M.D.   On: 01/19/2018 17:33   Scheduled Meds: . atorvastatin  10 mg Oral Daily  . carvedilol  6.25 mg Oral BID WC  . clotrimazole  1 application  Topical BID  . insulin aspart  0-15 Units Subcutaneous TID WC  . insulin aspart  0-5 Units Subcutaneous QHS  . insulin glargine  15 Units Subcutaneous QHS  . levETIRAcetam  1,000 mg Oral BID  . mirtazapine  30 mg Oral QHS  . nicotine  7 mg Transdermal Daily  . pantoprazole  40 mg Oral BID  . QUEtiapine  100 mg Oral QHS  . senna-docusate  1 tablet Oral BID  . tamsulosin  0.4 mg Oral QHS   Continuous Infusions: . heparin 1,300 Units/hr (01/20/18 0610)   PRN Meds:.albuterol, haloperidol **OR** haloperidol lactate, hydrocortisone, ondansetron (ZOFRAN) IV, oxyCODONE-acetaminophen, witch hazel-glycerin   ASSESMENT:   *  Upper GI bleed, hematemesis, melena.  sxs resolved.   01/18/2018 EGD.  LA grade C esophagitis.  Nonbleeding MWT with stigmata of recent bleeding no VV.  Distal esophageal salmon-colored mucosa, c/w short segment Barrett's (not biopsied).  Gastritis No signs of portal hypertension or varices.  *   Acute on chronic anemia due to GI blood loss.  S/P 2 U PRBCs.  Hgb overall stable.  MCV normal.  *    Chronic hepatitis C. Previous interferon-based therapy 10 or more years ago (so 2009 or earlier).  HCV quant of 243,000 on 12/22/17. Fibrosis score F3-F4.  Dr Linus Salmons from [place] is following.   He ordered outpt ultrasound, elastography, scheduled for today at [facility].  These are done in outpt setting, confirmed with [place] techs today.  Will need to have this rescheduled.    *  Agitation, confusion.  CT head completed.  S/p craniectomy.  MR brain ordered.    *   S/P mechanical AVR, on chronic Coumadin.  IV heparin in place, not yet restarted on Coumadin .    *   AKI.  Baseline stage III CKD.  *    Sleep apnea.  *    Remote partial colon resection for unclear, undefined reason.    PLAN   *  Ok to start Warfarin.    *  GI will sign off, call if questions.  Follow periodic Hgb.  Protonix BID through  11/6, then drop to 1 x daily.  PPI replaces PTA Zantac.      Adam Savage   01/20/2018, 8:35 AM Phone [phone]

## 2018-01-20 NOTE — Progress Notes (Signed)
Internal Medicine Attending:   I saw and examined the patient. I reviewed the resident's note and I agree with the resident's findings and plan as documented in the resident's note.  Patient less aggitated on exam today.  We discusssed findings with daugther and Dr Rory Percy.  Overall for his [facility] team and ABLA- this is stable, no evidence of further bleeding, continue PPI BID x 8 weeks. 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  Will check RPR with next blood draw.  We are awaiting a MRI to complete workup, he will need to follow up as outpatient with Neurology to consider additional treatment.  Would benefit from increased level of supervision at SNF or ALF after discharge due to this new diagnosis.  Overall we will continue to try to work on his delirum with supportive care, try to remove restrains as tolerated safely, will need to continue sitter overnight.  Resume warfarin today per pharmacy.

## 2018-01-21 ENCOUNTER — Encounter: Payer: Self-pay | Admitting: Gastroenterology

## 2018-01-21 DIAGNOSIS — F0280 Dementia in other diseases classified elsewhere without behavioral disturbance: Secondary | ICD-10-CM

## 2018-01-21 LAB — RPR: RPR Ser Ql: NONREACTIVE

## 2018-01-21 LAB — HEMOGLOBIN AND HEMATOCRIT, BLOOD
HCT: 23.5 % — ABNORMAL LOW (ref 39.0–52.0)
HCT: 19.2 % — ABNORMAL LOW (ref 39.0–52.0)
Hemoglobin: 7.3 g/dL — ABNORMAL LOW (ref 13.0–17.0)
Hemoglobin: 6.2 g/dL — CL (ref 13.0–17.0)

## 2018-01-21 LAB — PROTIME-INR
INR: 1.47
Prothrombin Time: 17.7 seconds — ABNORMAL HIGH (ref 11.4–15.2)

## 2018-01-21 LAB — COMPREHENSIVE METABOLIC PANEL
ALT: 49 U/L — ABNORMAL HIGH (ref 0–44)
AST: 76 U/L — ABNORMAL HIGH (ref 15–41)
Albumin: 2.3 g/dL — ABNORMAL LOW (ref 3.5–5.0)
Alkaline Phosphatase: 104 U/L (ref 38–126)
Anion gap: 6 (ref 5–15)
BUN: 34 mg/dL — ABNORMAL HIGH (ref 8–23)
CO2: 20 mmol/L — ABNORMAL LOW (ref 22–32)
Calcium: 7.8 mg/dL — ABNORMAL LOW (ref 8.9–10.3)
Chloride: 109 mmol/L (ref 98–111)
Creatinine, Ser: 1.63 mg/dL — ABNORMAL HIGH (ref 0.61–1.24)
GFR calc Af Amer: 49 mL/min — ABNORMAL LOW (ref >60)
GFR calc non Af Amer: 42 mL/min — ABNORMAL LOW (ref >60)
Glucose, Bld: 148 mg/dL — ABNORMAL HIGH (ref 70–99)
Potassium: 3.8 mmol/L (ref 3.5–5.1)
Sodium: 135 mmol/L (ref 135–145)
Total Bilirubin: 1.4 mg/dL — ABNORMAL HIGH (ref 0.3–1.2)
Total Protein: 4.9 g/dL — ABNORMAL LOW (ref 6.5–8.1)

## 2018-01-21 LAB — HEPARIN LEVEL (UNFRACTIONATED)
Heparin Unfractionated: 0.49 IU/mL (ref 0.30–0.70)
Heparin Unfractionated: 0.38 IU/mL (ref 0.30–0.70)

## 2018-01-21 LAB — GLUCOSE, CAPILLARY
Glucose-Capillary: 143 mg/dL — ABNORMAL HIGH (ref 70–99)
Glucose-Capillary: 180 mg/dL — ABNORMAL HIGH (ref 70–99)
Glucose-Capillary: 160 mg/dL — ABNORMAL HIGH (ref 70–99)
Glucose-Capillary: 185 mg/dL — ABNORMAL HIGH (ref 70–99)

## 2018-01-21 LAB — CBC
HCT: 24.6 % — ABNORMAL LOW (ref 39.0–52.0)
Hemoglobin: 7.7 g/dL — ABNORMAL LOW (ref 13.0–17.0)
MCH: 26.8 pg (ref 26.0–34.0)
MCHC: 31.3 g/dL (ref 30.0–36.0)
MCV: 85.7 fL (ref 80.0–100.0)
Platelets: 152 10*3/uL (ref 150–400)
RBC: 2.87 MIL/uL — ABNORMAL LOW (ref 4.22–5.81)
RDW: 16.3 % — ABNORMAL HIGH (ref 11.5–15.5)
WBC: 8.1 10*3/uL (ref 4.0–10.5)
nRBC: 0 % (ref 0.0–0.2)

## 2018-01-21 MED ORDER — SODIUM CHLORIDE 0.9 % IV BOLUS
500.0000 mL | Freq: Once | INTRAVENOUS | Status: AC
  Administered 2018-01-21: 500 mL via INTRAVENOUS

## 2018-01-21 MED ORDER — SODIUM CHLORIDE 0.9% IV SOLUTION
Freq: Once | INTRAVENOUS | Status: AC
  Administered 2018-01-22: 02:00:00 via INTRAVENOUS

## 2018-01-21 MED ORDER — WARFARIN SODIUM 5 MG PO TABS: 5.0000 mg | ORAL_TABLET | Freq: Once | ORAL | Status: DC

## 2018-01-21 NOTE — Progress Notes (Signed)
Patient has been hypotensive. BP 91/44. Saw the patient at bedside. No n/v, abdominal pain. No rectal bleeding/melena. BP repeated and was 80/44.  Hemoglobin has dropped from 8.8 yesterday to 7.3 today. -Monitor patient -Gave 500 cc bolus normal saline -Talked to GI about the patient.  Will hold warfarin today -NPO after mid night -follow-up with H&H this evening and CBC tomorrow.  If drop in hemoglobin or any GI bleeding, will do EGD tomorrow

## 2018-01-21 NOTE — Progress Notes (Signed)
Daily Rounding Note  01/21/2018, 2:30 PM  LOS: 4 days   SUBJECTIVE:   Chief complaint:  GI Bleed.  Hematochezia evolved to melena and hematemesis.   Smear of dark stool this AM, 1st in > 2 days.   Hgb dropping  Yesterday: 8.3 >> 8.4 >> 8.8 >> 7.7 AM today >> 7.3 @ 11 AM BPs to 90s/30s-40s corresponding with doses of Percocet.   Acutely confused and agitation requiring use of soft restraints   OBJECTIVE:         Vital signs in last 24 hours:    Temp:  [97.6 F (36.4 C)-98.2 F (36.8 C)] 97.6 F (36.4 C) (10/09 1121) Pulse Rate:  [70-98] 98 (10/09 1121) Resp:  [14-25] 25 (10/09 1121) BP: (86-153)/(35-82) 91/50 (10/09 1328) SpO2:  [100 %] 100 % (10/09 1121) Last BM Date: 01/19/18 Filed Weights   01/15/18 1838  Weight: 125.6 kg    Lungs: SOB with speaking.  Occasional cough.   Cards: RRR in 80s ABD: soft, obese, NT, active BS.   Psych/neuro: not oriented.  Asking me to cut off his thumb.  Speech clear but nonsensical.  Agitated.   EXT:  Some bruising on right arm, none on lower limbs, did not turn pt over to look at rear torso.    Intake/Output from previous day: 10/08 0701 - 10/09 0700 In: 1082.4 [P.O.:840; I.V.:242.4] Out: 1250 [Urine:1250]  Intake/Output this shift: Total I/O In: 240 [P.O.:240] Out: -   Lab Results: Recent Labs    01/19/18 0606  01/20/18 0146  01/20/18 1821 01/21/18 0402 01/21/18 1109  WBC 13.0*  --  10.6*  --   --  8.1  --   HGB 8.4*   < > 8.3*   < > 8.8* 7.7* 7.3*  HCT 25.6*   < > 25.3*   < > 26.6* 24.6* 23.5*  PLT 168  --  150  --   --  152  --    < > = values in this interval not displayed.   BMET Recent Labs    01/19/18 0606 01/21/18 0655  NA 137 135  K 4.4 3.8  CL 110 109  CO2 19* 20*  GLUCOSE 159* 148*  BUN 50* 34*  CREATININE 1.61* 1.63*  CALCIUM 8.1* 7.8*   LFT Recent Labs    01/21/18 0655  PROT 4.9*  ALBUMIN 2.3*  AST 76*  ALT 49*  ALKPHOS 104    BILITOT 1.4*   PT/INR Recent Labs    01/20/18 0146 01/21/18 0402  LABPROT 19.2* 17.7*  INR 1.63 1.47   Hepatitis Panel No results for input(s): HEPBSAG, HCVAB, HEPAIGM, HEPBIGM in the last 72 hours.  Studies/Results: Ct Head Wo Contrast  Result Date: 01/19/2018 CLINICAL DATA:  Cognitive dysfunction/altered mental status. History of seizures EXAM: CT HEAD WITHOUT CONTRAST TECHNIQUE: Contiguous axial images were obtained from the base of the skull through the vertex without intravenous contrast. COMPARISON:  December 29, 2017 FINDINGS: Brain: The ventricles are normal in size and configuration. The patient has had a previous right temporal lobectomy with encephalomalacia throughout the right temporal region. There is no mass, hemorrhage, subdural or epidural fluid collection, or midline shift. There is mild small vessel disease in the centra semiovale bilaterally. No acute infarct is evident. Vascular: There is no appreciable hyperdense vessel. There is calcification in each distal vertebral artery in each carotid siphon region. Skull: Patient has had extensive right-sided craniotomies with several craniotomy defects. Bony  calvarium otherwise appears intact. Sinuses/Orbits: There is opacification throughout the left maxillary antrum. 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 There is opacification of several anterior ethmoid air cells. There is rightward deviation of the nasal septum. Orbits appear symmetric bilaterally. Other: Mastoid air cells are clear. IMPRESSION: Status post right temporal lobectomy with encephalomalacia throughout this area. There is mild periventricular small vessel disease. No acute infarct. No mass or hemorrhage. There are foci of arterial vascular calcification. Extensive post craniotomy bone defects on the right. Extensive paranasal sinus disease on the left. 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 There is rightward deviation of the nasal septum. Electronically Signed   By: Lowella Grip III M.D.   On: 01/19/2018 17:33   Scheduled Meds: . atorvastatin  10 mg Oral Daily  . carvedilol  6.25 mg Oral BID WC  . clotrimazole  1 application Topical BID  . insulin aspart  0-15 Units Subcutaneous TID WC  . insulin aspart  0-5 Units Subcutaneous QHS  . insulin glargine  15 Units Subcutaneous QHS  . levETIRAcetam  1,000 mg Oral BID  . mirtazapine  30 mg Oral QHS  . nicotine  7 mg Transdermal Daily  . pantoprazole  40 mg Oral BID  . QUEtiapine  100 mg Oral QHS  . senna-docusate  1 tablet Oral BID  . tamsulosin  0.4 mg Oral QHS   Continuous Infusions: . heparin 850 Units/hr (01/21/18 1117)   PRN Meds:.albuterol, haloperidol **OR** haloperidol lactate, hydrocortisone, ondansetron (ZOFRAN) IV, oxyCODONE-acetaminophen, witch hazel-glycerin  ASSESMENT:   *Upper GI bleed, hematemesis, melena. Resolved.   01/18/2018 EGD.LA grade C esophagitis.Nonbleeding MWT with stigmata of recent bleeding no VV.Distal esophageal salmon-colored mucosa, c/w short segment Barrett's (not biopsied). Gastritis.   No signs of portal hypertension or varices. Remains on Protonix 40 mg p.o. twice daily  *Acute on chronic anemia due to GI blood loss. S/P 2 UPRBCs. Hgb overall stable. MCV normal.  *Chronic hepatitis C.Previous interferon-based therapy 10 or more years ago (so 2009 or earlier). HCV quant of 243,000 on 12/22/17. Fibrosis score F3-F4.  Dr Linus Salmons from ID follows. He ordered outpt ultrasound, elastography, scheduled for today at [facility].  These are done in outpt setting, confirmed with [place] techs today.  Will need to have this rescheduled.  At some point had liver bx in [place], results said to be "inconclusive", "can not r/o cirrhosis"  *S/P mechanical AVR, on chronic Coumadin.IV heparin in place,  restarted Coumadin yesterday, 1 dose so far.  *   AMS, delerium.  Multifactorial.   Ammonia level just 39.    *   Previous sigmoid colectomy for unclear reason.  2013 vs 2014.    PLAN   *    In the setting of dropping Hgb, would hold off on Coumadin for the time being, continue heparin.  *    H&H this evening and CBC in the morning.  *     N.p.o. after midnight in case we need to pursue another EGD tomorrow.  *   Though they do not perform elastography inpt, should we obtain ultrasound abdomen to assess if he has cirrhosis as inpt?      Adam Savage  01/21/2018, 2:30 PM Phone [phone]

## 2018-01-21 NOTE — Progress Notes (Signed)
ANTICOAGULATION CONSULT NOTE - Follow Up Consult  Pharmacy Consult for heparin Indication: AVR  Labs: Recent Labs    01/19/18 0606  01/20/18 0146 01/20/18 0914 01/20/18 1225 01/20/18 1819 01/20/18 1821 01/21/18 0402  HGB 8.4*   < > 8.3*  --  8.4*  --  8.8* 7.7*  HCT 25.6*   < > 25.3*  --  25.1*  --  26.6* 24.6*  PLT 168  --  150  --   --   --   --  152  LABPROT 19.1*  --  19.2*  --   --   --   --  17.7*  INR 1.62  --  1.63  --   --   --   --  1.47  HEPARINUNFRC  --    < > 0.69 0.93*  --  0.89*  --  0.49  CREATININE 1.61*  --   --   --   --   --   --   --    < > = values in this interval not displayed.    Assessment: 67yo male now therapeutic on heparin after rate changes but at upper end of goal; RN notes no gtt issues or overt signs of bleeding other than some oozing at IV site when pt pulled at it.  Goal of Therapy:  Heparin level 0.3-0.5 units/ml   Plan:  Will decrease heparin gtt slightly to 850 units/hr and check level in 6 hours.    Wynona Neat, PharmD, BCPS  01/21/2018,5:07 AM

## 2018-01-21 NOTE — Progress Notes (Signed)
CRITICAL VALUE ALERT  Critical Value:  HGB 6.0  Date & Time Notied:  01/21/18 2343  Provider Notified: Dr. Truman Hayward  Orders Received/Actions taken:

## 2018-01-21 NOTE — Progress Notes (Addendum)
Internal Medicine Attending:   I saw and examined the patient. I reviewed the resident's note and I agree with the resident's findings and plan as documented in the resident's note.  Patient oriented to person and place, seemed less agitated - wanting water. Overall as noted, delirium appears to be improving continue supportive care. His Hgb has dropped slightly, will keep close eye for signs of repeat bleeding, repeat CBC in AM.  NPO after midnight incase need for repeat EGD.

## 2018-01-21 NOTE — Progress Notes (Signed)
PT Cancellation Note  Patient Details Name: [name] MRN: [id number] DOB: 1951-02-12   Cancelled Treatment:    Reason Eval/Treat Not Completed: Medical issues which prohibited therapy per RN, pt agitated, low BP and VT. Requests we re-attempt at a later time. Will cont to follow.   Reinaldo Berber, PT, DPT Acute Rehabilitation Services Pager: [phone] Office: [phone]     Reinaldo Berber 01/21/2018, 12:23 PM

## 2018-01-21 NOTE — Progress Notes (Signed)
Patient had 9 beats of SVT, the patient was at the time experiencing no pain or dyspnea; is still agitated.  The on call provider has been made aware.  The patient continues to be monitored.

## 2018-01-21 NOTE — Progress Notes (Signed)
   Subjective: Patient was seen and evaluated at bedside on morning rounds. He was sleeping but responded when was called. A little drowsy but partialy oriented. MRI was not completed yesterday due to agitation Not agitated or combative now.  Objective:  Vital signs in last 24 hours: Vitals:   01/20/18 2322 01/21/18 0402 01/21/18 0700 01/21/18 0723  BP: (!) 153/65 103/65 (!) 86/50 99/73  Pulse: 87 88 96   Resp: (!) 21 18 20    Temp: 98.2 F (36.8 C) 98 F (36.7 C) 98.1 F (36.7 C)   TempSrc: Axillary Axillary Axillary   SpO2: 100% 100% 100%   Weight:      Height:       Physical Exam  Constitutional: He appears well-developed and well-nourished. No distress.  Eyes: EOM are normal.  Cardiovascular: Normal rate, regular rhythm, normal heart sounds and intact distal pulses.  No murmur heard. Pulmonary/Chest: Effort normal and breath sounds normal. No respiratory distress. He has no rales.  Abdominal: Soft. Bowel sounds are normal. There is no tenderness.  Musculoskeletal: He exhibits no edema. Rashes Neurologic. mildly drowsy. Partially oriented.  Assessment/Plan:  Principal Problem:   Mallory-Weiss tear Active Problems:   Seizure disorder (HCC)   Chronic anticoagulation   Internal hemorrhoid, bleeding   Perianal infection   Blood loss anemia   S/P AVR (aortic valve replacement)   Acute encephalopathy   Dementia associated with other underlying disease with behavioral disturbance [facility])  [name] is a 67 y.o male with a history of AS s/p mechanical valve replacement on Warfarin, epilepsy s/p temporal lobectomy, and DM who presented to the ED with worsening rectal pain and bleeding.He was doing well until 10/05 when he subsequently developed acute upper GI bleed as outlined below. Also complicated with altered mental status  Altered mental status, agitation Progressive dementia secondary to previous temporal lobectomy due to seizure. Clinically improved today.  He is  not agitated or combative today. No seizure. RPR is pending MRI was not performed yesterday due to agitation despite Ativan  -Continue Seroquel 100 mg daily -Follow-up RPR result -Follow-up with neurology outpatient considering additional treatment required -Continue Keppra 1 g twice daily -Halodal PRN but caution as it decrease seizure threshold  Upper GI Bleed: reflux esophagitis with possible short-segment Barrett's. Nonbleeding MW tear with stigmata of recent bleeding. With Hx ofHep C. HD stable. GI is on board.  Recommend follow-up with patient in a benefit of repeating EGD daily in 6 months. Hb drop to 7.7 today. No bleeding reported.    - Continue Pantoprazole 40 mg BID for 8 weeks per GI - H&H monitoring. Transfusion goal>7 -Avoid NSAIDs   S/P AVR (aortic valve replacement): Bridged to warfarin yesterday per GI  -Continue warfarin bridge per pharmacy -Monitor for bleeding -Monitor H&H twice a day  Hepatitis C. Stable Elastography was not done yesterday because not available inpatient  - Outpatient follow-up with ID   Perianal and gluteal intertrigo. Has no pain - Continuingclotrimazole creamBID  Internal Hemorrhoids  - Continue hydrocortisone suppositories and Senokot  Dispo: May discharge in 2 to 3 days if hemodynamically stable and after planning for placement to memory versus nursing facility   Dewayne Hatch, MD 01/21/2018, 10:28 AM Pager: [phone]

## 2018-01-21 NOTE — Progress Notes (Signed)
ANTICOAGULATION CONSULT NOTE - Follow Up Consult  Pharmacy Consult for heparin and warfarin  Indication: mechanical AVR  Labs: Recent Labs    01/19/18 0606  01/20/18 0146  01/20/18 1819 01/20/18 1821 01/21/18 0402 01/21/18 0655 01/21/18 1109  HGB 8.4*   < > 8.3*   < >  --  8.8* 7.7*  --  7.3*  HCT 25.6*   < > 25.3*   < >  --  26.6* 24.6*  --  23.5*  PLT 168  --  150  --   --   --  152  --   --   LABPROT 19.1*  --  19.2*  --   --   --  17.7*  --   --   INR 1.62  --  1.63  --   --   --  1.47  --   --   HEPARINUNFRC  --    < > 0.69   < > 0.89*  --  0.49  --  0.38  CREATININE 1.61*  --   --   --   --   --   --  1.63*  --    < > = values in this interval not displayed.    Assessment: 67yo male with HL = 0.38, remains therapeutic after heparin rate decreased to 850 units/hr. S/p EGD on 10/6 which showed likely source of bleed as Mallory-Weiss tear. GI okayed restart warfarin on 10/7 if no further bleeding, however the IMTS team decided to wait until 10/8 to resumed. Coumadin was resumed yesterday 01/20/18. INR is 1.47 today, trending down due to off coumadin for 3 days.  Hgb dropped to 7.3 today. No bleeding reported.  Goal of Therapy:  Heparin level 0.3-0.5 units/ml  INR 2-3 per [facility]   Plan:  Continue heparin gtt 850 units/hr Warfarin 5 mg po today x1 Daily HL, INR and CBC.  Thank you for allowing pharmacy to be part of this patients care team. Nicole Cella, RPh Clinical Pharmacist Please check AMION for all [place] phone numbers After 10:00 PM, call [place] 01/21/2018,12:19 PM

## 2018-01-22 ENCOUNTER — Inpatient Hospital Stay (HOSPITAL_COMMUNITY): Payer: Medicare Other | Admitting: Certified Registered Nurse Anesthetist

## 2018-01-22 ENCOUNTER — Encounter: Payer: Self-pay | Admitting: Physician Assistant

## 2018-01-22 ENCOUNTER — Inpatient Hospital Stay (HOSPITAL_COMMUNITY): Payer: Medicare Other

## 2018-01-22 ENCOUNTER — Encounter (HOSPITAL_COMMUNITY): Payer: Self-pay | Admitting: Gastroenterology

## 2018-01-22 DIAGNOSIS — I469 Cardiac arrest, cause unspecified: Secondary | ICD-10-CM

## 2018-01-22 DIAGNOSIS — R1084 Generalized abdominal pain: Secondary | ICD-10-CM

## 2018-01-22 DIAGNOSIS — D649 Anemia, unspecified: Secondary | ICD-10-CM

## 2018-01-22 DIAGNOSIS — R140 Abdominal distension (gaseous): Secondary | ICD-10-CM

## 2018-01-22 DIAGNOSIS — N179 Acute kidney failure, unspecified: Secondary | ICD-10-CM | POA: Diagnosis present

## 2018-01-22 LAB — BASIC METABOLIC PANEL
Anion gap: 10 (ref 5–15)
Anion gap: 13 (ref 5–15)
BUN: 53 mg/dL — ABNORMAL HIGH (ref 8–23)
BUN: 54 mg/dL — ABNORMAL HIGH (ref 8–23)
CO2: 17 mmol/L — ABNORMAL LOW (ref 22–32)
CO2: 14 mmol/L — ABNORMAL LOW (ref 22–32)
Calcium: 7.9 mg/dL — ABNORMAL LOW (ref 8.9–10.3)
Calcium: 7.9 mg/dL — ABNORMAL LOW (ref 8.9–10.3)
Chloride: 108 mmol/L (ref 98–111)
Chloride: 108 mmol/L (ref 98–111)
Creatinine, Ser: 3.33 mg/dL — ABNORMAL HIGH (ref 0.61–1.24)
Creatinine, Ser: 3.62 mg/dL — ABNORMAL HIGH (ref 0.61–1.24)
GFR calc Af Amer: 21 mL/min — ABNORMAL LOW (ref >60)
GFR calc Af Amer: 19 mL/min — ABNORMAL LOW (ref >60)
GFR calc non Af Amer: 18 mL/min — ABNORMAL LOW (ref >60)
GFR calc non Af Amer: 16 mL/min — ABNORMAL LOW (ref >60)
Glucose, Bld: 223 mg/dL — ABNORMAL HIGH (ref 70–99)
Glucose, Bld: 251 mg/dL — ABNORMAL HIGH (ref 70–99)
Potassium: 5 mmol/L (ref 3.5–5.1)
Potassium: 4.8 mmol/L (ref 3.5–5.1)
Sodium: 135 mmol/L (ref 135–145)
Sodium: 135 mmol/L (ref 135–145)

## 2018-01-22 LAB — HEPATIC FUNCTION PANEL
ALT: 88 U/L — ABNORMAL HIGH (ref 0–44)
AST: 152 U/L — ABNORMAL HIGH (ref 15–41)
Albumin: 1.8 g/dL — ABNORMAL LOW (ref 3.5–5.0)
Alkaline Phosphatase: 109 U/L (ref 38–126)
Bilirubin, Direct: 0.7 mg/dL — ABNORMAL HIGH (ref 0.0–0.2)
Indirect Bilirubin: 0.9 mg/dL (ref 0.3–0.9)
Total Bilirubin: 1.6 mg/dL — ABNORMAL HIGH (ref 0.3–1.2)
Total Protein: 3.9 g/dL — ABNORMAL LOW (ref 6.5–8.1)

## 2018-01-22 LAB — RENAL FUNCTION PANEL
Albumin: 1.8 g/dL — ABNORMAL LOW (ref 3.5–5.0)
Anion gap: 17 — ABNORMAL HIGH (ref 5–15)
BUN: 52 mg/dL — ABNORMAL HIGH (ref 8–23)
CO2: 13 mmol/L — ABNORMAL LOW (ref 22–32)
Calcium: 7.7 mg/dL — ABNORMAL LOW (ref 8.9–10.3)
Chloride: 109 mmol/L (ref 98–111)
Creatinine, Ser: 3.95 mg/dL — ABNORMAL HIGH (ref 0.61–1.24)
GFR calc Af Amer: 17 mL/min — ABNORMAL LOW (ref >60)
GFR calc non Af Amer: 14 mL/min — ABNORMAL LOW (ref >60)
Glucose, Bld: 132 mg/dL — ABNORMAL HIGH (ref 70–99)
Phosphorus: 8.5 mg/dL — ABNORMAL HIGH (ref 2.5–4.6)
Potassium: 5.7 mmol/L — ABNORMAL HIGH (ref 3.5–5.1)
Sodium: 139 mmol/L (ref 135–145)

## 2018-01-22 LAB — CBC WITH DIFFERENTIAL/PLATELET
Abs Immature Granulocytes: 0.78 10*3/uL — ABNORMAL HIGH (ref 0.00–0.07)
Basophils Absolute: 0.1 10*3/uL (ref 0.0–0.1)
Basophils Relative: 0 %
Eosinophils Absolute: 0 10*3/uL (ref 0.0–0.5)
Eosinophils Relative: 0 %
HCT: 37.5 % — ABNORMAL LOW (ref 39.0–52.0)
Hemoglobin: 12.2 g/dL — ABNORMAL LOW (ref 13.0–17.0)
Immature Granulocytes: 2 %
Lymphocytes Relative: 5 %
Lymphs Abs: 1.9 10*3/uL (ref 0.7–4.0)
MCH: 27.1 pg (ref 26.0–34.0)
MCHC: 32.5 g/dL (ref 30.0–36.0)
MCV: 83.1 fL (ref 80.0–100.0)
Monocytes Absolute: 3.8 10*3/uL — ABNORMAL HIGH (ref 0.1–1.0)
Monocytes Relative: 10 %
Neutro Abs: 32.5 10*3/uL — ABNORMAL HIGH (ref 1.7–7.7)
Neutrophils Relative %: 83 %
Platelets: 169 10*3/uL (ref 150–400)
RBC: 4.51 MIL/uL (ref 4.22–5.81)
RDW: 15 % (ref 11.5–15.5)
WBC: 39.2 10*3/uL — ABNORMAL HIGH (ref 4.0–10.5)
nRBC: 0.2 % (ref 0.0–0.2)

## 2018-01-22 LAB — CBC
HCT: 21.5 % — ABNORMAL LOW (ref 39.0–52.0)
HCT: 20.8 % — ABNORMAL LOW (ref 39.0–52.0)
HCT: 37.3 % — ABNORMAL LOW (ref 39.0–52.0)
Hemoglobin: 7 g/dL — ABNORMAL LOW (ref 13.0–17.0)
Hemoglobin: 6.4 g/dL — CL (ref 13.0–17.0)
Hemoglobin: 12.2 g/dL — ABNORMAL LOW (ref 13.0–17.0)
MCH: 28.3 pg (ref 26.0–34.0)
MCH: 28.6 pg (ref 26.0–34.0)
MCH: 26.6 pg (ref 26.0–34.0)
MCHC: 32.6 g/dL (ref 30.0–36.0)
MCHC: 30.8 g/dL (ref 30.0–36.0)
MCHC: 32.7 g/dL (ref 30.0–36.0)
MCV: 87 fL (ref 80.0–100.0)
MCV: 92.9 fL (ref 80.0–100.0)
MCV: 81.4 fL (ref 80.0–100.0)
Platelets: 180 10*3/uL (ref 150–400)
Platelets: 196 10*3/uL (ref 150–400)
Platelets: 170 10*3/uL (ref 150–400)
RBC: 2.47 MIL/uL — ABNORMAL LOW (ref 4.22–5.81)
RBC: 2.24 MIL/uL — ABNORMAL LOW (ref 4.22–5.81)
RBC: 4.58 MIL/uL (ref 4.22–5.81)
RDW: 15.8 % — ABNORMAL HIGH (ref 11.5–15.5)
RDW: 15.9 % — ABNORMAL HIGH (ref 11.5–15.5)
RDW: 15 % (ref 11.5–15.5)
WBC: 19.5 10*3/uL — ABNORMAL HIGH (ref 4.0–10.5)
WBC: 35.8 10*3/uL — ABNORMAL HIGH (ref 4.0–10.5)
WBC: 41 10*3/uL — ABNORMAL HIGH (ref 4.0–10.5)
nRBC: 0.2 % (ref 0.0–0.2)
nRBC: 0.2 % (ref 0.0–0.2)

## 2018-01-22 LAB — LACTIC ACID, PLASMA
Lactic Acid, Venous: 11.4 mmol/L (ref 0.5–1.9)
Lactic Acid, Venous: 3.9 mmol/L (ref 0.5–1.9)

## 2018-01-22 LAB — POCT I-STAT 3, ART BLOOD GAS (G3+)
Acid-base deficit: 17 mmol/L — ABNORMAL HIGH (ref 0.0–2.0)
Acid-base deficit: 10 mmol/L — ABNORMAL HIGH (ref 0.0–2.0)
Bicarbonate: 10.7 mmol/L — ABNORMAL LOW (ref 20.0–28.0)
Bicarbonate: 16 mmol/L — ABNORMAL LOW (ref 20.0–28.0)
O2 Saturation: 100 %
O2 Saturation: 100 %
Patient temperature: 36.8
Patient temperature: 97.8
TCO2: 12 mmol/L — ABNORMAL LOW (ref 22–32)
TCO2: 17 mmol/L — ABNORMAL LOW (ref 22–32)
pCO2 arterial: 33.1 mmHg (ref 32.0–48.0)
pCO2 arterial: 32.9 mmHg (ref 32.0–48.0)
pH, Arterial: 7.116 — CL (ref 7.350–7.450)
pH, Arterial: 7.293 — ABNORMAL LOW (ref 7.350–7.450)
pO2, Arterial: 379 mmHg — ABNORMAL HIGH (ref 83.0–108.0)
pO2, Arterial: 324 mmHg — ABNORMAL HIGH (ref 83.0–108.0)

## 2018-01-22 LAB — IRON AND TIBC
Iron: 23 ug/dL — ABNORMAL LOW (ref 45–182)
Saturation Ratios: 8 % — ABNORMAL LOW (ref 17.9–39.5)
TIBC: 276 ug/dL (ref 250–450)
UIBC: 253 ug/dL

## 2018-01-22 LAB — BLOOD GAS, ARTERIAL
Acid-base deficit: 8.2 mmol/L — ABNORMAL HIGH (ref 0.0–2.0)
Bicarbonate: 15.2 mmol/L — ABNORMAL LOW (ref 20.0–28.0)
FIO2: 50
MECHVT: 620 mL
O2 Saturation: 98.2 %
PEEP: 5 cmH2O
Patient temperature: 100.3
RATE: 24 resp/min
pCO2 arterial: 23.7 mmHg — ABNORMAL LOW (ref 32.0–48.0)
pH, Arterial: 7.428 (ref 7.350–7.450)
pO2, Arterial: 113 mmHg — ABNORMAL HIGH (ref 83.0–108.0)

## 2018-01-22 LAB — PROTIME-INR
INR: 1.97
INR: 2.76
INR: 1.71
Prothrombin Time: 22.3 seconds — ABNORMAL HIGH (ref 11.4–15.2)
Prothrombin Time: 29 seconds — ABNORMAL HIGH (ref 11.4–15.2)
Prothrombin Time: 20 seconds — ABNORMAL HIGH (ref 11.4–15.2)

## 2018-01-22 LAB — PREPARE RBC (CROSSMATCH)

## 2018-01-22 LAB — GLUCOSE, CAPILLARY
Glucose-Capillary: 202 mg/dL — ABNORMAL HIGH (ref 70–99)
Glucose-Capillary: 148 mg/dL — ABNORMAL HIGH (ref 70–99)
Glucose-Capillary: 238 mg/dL — ABNORMAL HIGH (ref 70–99)
Glucose-Capillary: 263 mg/dL — ABNORMAL HIGH (ref 70–99)

## 2018-01-22 LAB — VITAMIN B12: Vitamin B-12: 1430 pg/mL — ABNORMAL HIGH (ref 180–914)

## 2018-01-22 LAB — POCT I-STAT 4, (NA,K, GLUC, HGB,HCT)
Glucose, Bld: 141 mg/dL — ABNORMAL HIGH (ref 70–99)
HCT: 23 % — ABNORMAL LOW (ref 39.0–52.0)
Hemoglobin: 7.8 g/dL — ABNORMAL LOW (ref 13.0–17.0)
Potassium: 4.1 mmol/L (ref 3.5–5.1)
Sodium: 143 mmol/L (ref 135–145)

## 2018-01-22 LAB — RETICULOCYTES
RBC.: 2.24 MIL/uL — ABNORMAL LOW (ref 4.22–5.81)
Retic Count, Absolute: 136.6 10*3/uL (ref 19.0–186.0)
Retic Ct Pct: 6.1 % — ABNORMAL HIGH (ref 0.4–3.1)

## 2018-01-22 LAB — HEPARIN LEVEL (UNFRACTIONATED): Heparin Unfractionated: 0.37 IU/mL (ref 0.30–0.70)

## 2018-01-22 LAB — FERRITIN: Ferritin: 86 ng/mL (ref 24–336)

## 2018-01-22 LAB — PROCALCITONIN: Procalcitonin: 0.5 ng/mL

## 2018-01-22 LAB — FOLATE: Folate: 15.2 ng/mL (ref >5.9)

## 2018-01-22 LAB — LACTATE DEHYDROGENASE: LDH: 374 U/L — ABNORMAL HIGH (ref 98–192)

## 2018-01-22 LAB — MAGNESIUM: Magnesium: 2.5 mg/dL — ABNORMAL HIGH (ref 1.7–2.4)

## 2018-01-22 LAB — TROPONIN I: Troponin I: 0.03 ng/mL (ref <0.03)

## 2018-01-22 MED ORDER — SODIUM CHLORIDE 0.9 % IV SOLN
8.0000 mg/h | INTRAVENOUS | Status: DC
  Administered 2018-01-22 – 2018-01-23 (×4): 8 mg/h via INTRAVENOUS
  Filled 2018-01-22: qty 40
  Filled 2018-01-22 (×6): qty 80

## 2018-01-22 MED ORDER — PANTOPRAZOLE SODIUM 40 MG IV SOLR: 40.0000 mg | Freq: Two times a day (BID) | INTRAVENOUS | Status: DC

## 2018-01-22 MED ORDER — NOREPINEPHRINE 16 MG/250ML-% IV SOLN
0.0000 ug/min | INTRAVENOUS | Status: DC
  Administered 2018-01-23: 35 ug/min via INTRAVENOUS
  Administered 2018-01-23: 15 ug/min via INTRAVENOUS
  Administered 2018-01-23: 41 ug/min via INTRAVENOUS
  Filled 2018-01-22 (×3): qty 250

## 2018-01-22 MED ORDER — INSULIN ASPART 100 UNIT/ML ~~LOC~~ SOLN
11.0000 [IU] | Freq: Once | SUBCUTANEOUS | Status: AC
  Administered 2018-01-22: 11 [IU] via SUBCUTANEOUS

## 2018-01-22 MED ORDER — FENTANYL CITRATE (PF) 100 MCG/2ML IJ SOLN
50.0000 ug | INTRAMUSCULAR | Status: AC | PRN
  Administered 2018-01-22 (×3): 50 ug via INTRAVENOUS
  Filled 2018-01-22 (×2): qty 2

## 2018-01-22 MED ORDER — MIDAZOLAM HCL 2 MG/2ML IJ SOLN
1.0000 mg | INTRAMUSCULAR | Status: DC | PRN
  Administered 2018-01-22 (×2): 1 mg via INTRAVENOUS
  Filled 2018-01-22 (×2): qty 2

## 2018-01-22 MED ORDER — ORAL CARE MOUTH RINSE
15.0000 mL | OROMUCOSAL | Status: DC
  Administered 2018-01-22 – 2018-01-23 (×10): 15 mL via OROMUCOSAL

## 2018-01-22 MED ORDER — SODIUM CHLORIDE 0.9% IV SOLUTION: Freq: Once | INTRAVENOUS | Status: AC

## 2018-01-22 MED ORDER — INSULIN ASPART 100 UNIT/ML ~~LOC~~ SOLN
0.0000 [IU] | SUBCUTANEOUS | Status: DC
  Administered 2018-01-22: 3 [IU] via SUBCUTANEOUS

## 2018-01-22 MED ORDER — SODIUM CHLORIDE 0.9 % IV SOLN
80.0000 mg | Freq: Once | INTRAVENOUS | Status: AC
  Administered 2018-01-22: 80 mg via INTRAVENOUS
  Filled 2018-01-22: qty 80

## 2018-01-22 MED ORDER — FENTANYL 2500MCG IN NS 250ML (10MCG/ML) PREMIX INFUSION
0.0000 ug/h | INTRAVENOUS | Status: DC
  Administered 2018-01-22: 25 ug/h via INTRAVENOUS
  Filled 2018-01-22: qty 250

## 2018-01-22 MED ORDER — SODIUM BICARBONATE 8.4 % IV SOLN
INTRAVENOUS | Status: AC
  Administered 2018-01-22: 100 meq
  Filled 2018-01-22: qty 100

## 2018-01-22 MED ORDER — VITAMIN K1 10 MG/ML IJ SOLN
10.0000 mg | Freq: Once | INTRAVENOUS | Status: AC
  Administered 2018-01-22: 10 mg via INTRAVENOUS
  Filled 2018-01-22: qty 1

## 2018-01-22 MED ORDER — FAMOTIDINE IN NACL 20-0.9 MG/50ML-% IV SOLN: 20.0000 mg | Freq: Two times a day (BID) | INTRAVENOUS | Status: DC

## 2018-01-22 MED ORDER — SODIUM CHLORIDE 0.9 % IV SOLN
1.0000 g | INTRAVENOUS | Status: DC
  Administered 2018-01-22: 1 g via INTRAVENOUS
  Filled 2018-01-22 (×2): qty 10

## 2018-01-22 MED ORDER — INSULIN ASPART 100 UNIT/ML ~~LOC~~ SOLN
0.0000 [IU] | SUBCUTANEOUS | Status: DC
  Administered 2018-01-22 (×2): 11 [IU] via SUBCUTANEOUS
  Administered 2018-01-23 (×3): 4 [IU] via SUBCUTANEOUS
  Administered 2018-01-23: 7 [IU] via SUBCUTANEOUS
  Administered 2018-01-23: 4 [IU] via SUBCUTANEOUS
  Administered 2018-01-23 – 2018-01-24 (×2): 7 [IU] via SUBCUTANEOUS
  Administered 2018-01-24: 2 [IU] via SUBCUTANEOUS
  Administered 2018-01-24: 3 [IU] via SUBCUTANEOUS
  Administered 2018-01-24: 4 [IU] via SUBCUTANEOUS
  Administered 2018-01-24: 2 [IU] via SUBCUTANEOUS
  Administered 2018-01-25: 4 [IU] via SUBCUTANEOUS
  Administered 2018-01-25 (×4): 3 [IU] via SUBCUTANEOUS
  Administered 2018-01-26 (×2): 4 [IU] via SUBCUTANEOUS
  Administered 2018-01-26 (×3): 3 [IU] via SUBCUTANEOUS
  Administered 2018-01-27: 7 [IU] via SUBCUTANEOUS
  Administered 2018-01-27 (×2): 4 [IU] via SUBCUTANEOUS
  Administered 2018-01-28 (×2): 3 [IU] via SUBCUTANEOUS
  Administered 2018-01-28 – 2018-01-29 (×2): 4 [IU] via SUBCUTANEOUS
  Administered 2018-01-29: 3 [IU] via SUBCUTANEOUS
  Administered 2018-01-29 – 2018-01-30 (×2): 4 [IU] via SUBCUTANEOUS
  Administered 2018-01-30: 3 [IU] via SUBCUTANEOUS

## 2018-01-22 MED ORDER — LEVETIRACETAM 250 MG PO TABS
500.0000 mg | ORAL_TABLET | Freq: Two times a day (BID) | ORAL | Status: DC
  Administered 2018-01-22 – 2018-01-24 (×4): 500 mg via ORAL
  Filled 2018-01-22 (×3): qty 2

## 2018-01-22 MED ORDER — VITAMIN K1 10 MG/ML IJ SOLN
5.0000 mg | Freq: Once | INTRAVENOUS | Status: DC
  Filled 2018-01-22: qty 0.5

## 2018-01-22 MED ORDER — CHLORHEXIDINE GLUCONATE 0.12% ORAL RINSE (MEDLINE KIT)
15.0000 mL | Freq: Two times a day (BID) | OROMUCOSAL | Status: DC
  Administered 2018-01-22 – 2018-01-23 (×2): 15 mL via OROMUCOSAL

## 2018-01-22 MED ORDER — LACTATED RINGERS IV BOLUS
1000.0000 mL | Freq: Once | INTRAVENOUS | Status: AC
  Administered 2018-01-22: 1000 mL via INTRAVENOUS

## 2018-01-22 MED ORDER — MIDAZOLAM HCL 2 MG/2ML IJ SOLN
1.0000 mg | INTRAMUSCULAR | Status: DC | PRN
  Administered 2018-01-22 – 2018-01-23 (×3): 1 mg via INTRAVENOUS
  Filled 2018-01-22 (×3): qty 2

## 2018-01-22 MED ORDER — FENTANYL CITRATE (PF) 100 MCG/2ML IJ SOLN
INTRAMUSCULAR | Status: AC
  Administered 2018-01-22: 100 ug
  Filled 2018-01-22: qty 2

## 2018-01-22 MED ORDER — NOREPINEPHRINE 4 MG/250ML-% IV SOLN
0.0000 ug/min | INTRAVENOUS | Status: DC
  Administered 2018-01-22: 40 ug/min via INTRAVENOUS
  Administered 2018-01-22 (×3): 30 ug/min via INTRAVENOUS
  Filled 2018-01-22 (×6): qty 250

## 2018-01-22 MED ORDER — FENTANYL CITRATE (PF) 100 MCG/2ML IJ SOLN
50.0000 ug | INTRAMUSCULAR | Status: DC | PRN
  Administered 2018-01-22 – 2018-01-26 (×14): 50 ug via INTRAVENOUS
  Filled 2018-01-22 (×12): qty 2

## 2018-01-22 MED FILL — Medication: Qty: 1 | Status: AC

## 2018-01-22 NOTE — Progress Notes (Signed)
OT Cancellation Note  Patient Details Name: Adam Savage MRN: [id number] DOB: [birth year]   Cancelled Treatment:    Reason Eval/Treat Not Completed: Medical issues which prohibited therapy.. Pt coded and intubated with transfer to [place].   [place], OTR/L Acute Rehab Services Pager [phone] Office [phone]     Almon Register 01/22/2018, 12:41 PM

## 2018-01-22 NOTE — Progress Notes (Signed)
Bladder scan resulted 260cc retention. Creatinine bumped from 1.63 10/9 to 3.33 10/10. Patient c/o diffuse belly pain. 50cc urine output overnight, minimal output so far this morning. MD notified.   Kinnie Scales, RN 01/22/18 [phone]

## 2018-01-22 NOTE — Progress Notes (Signed)
Subjective: rowsy today, when ask if has abdominal pain, he confirms.   Objective:  Vital signs in last 24 hours: Vitals:   01/22/18 0329 01/22/18 0435 01/22/18 0515 01/22/18 0712  BP: (!) 88/55 96/62 100/64 95/60  Pulse:   71 81  Resp:   18 (!) 24  Temp:   97.9 F (36.6 C) 97.6 F (36.4 C)  TempSrc:   Axillary Axillary  SpO2:    100%  Weight:      Height:      Physical exam: Looks ill but not toxic.   Less alert than yesterday. Partially Oriented and follow comands. CV: Mechanical valve sound. Lungs: Tachypnic. Clear to auscultation Abdomen: Soft. Mild to moderate tenderness.  CBC Latest Ref Rng & Units 01/22/2018 01/21/2018 01/21/2018  WBC 4.0 - 10.5 K/uL 19.5(H) - -  Hemoglobin 13.0 - 17.0 g/dL 7.0(L) 6.2(LL) 7.3(L)  Hematocrit 39.0 - 52.0 % 21.5(L) 19.2(L) 23.5(L)  Platelets 150 - 400 K/uL 180 - -   BMP Latest Ref Rng & Units 01/22/2018 01/22/2018 01/21/2018  Glucose 70 - 99 mg/dL 251(H) 223(H) 148(H)  BUN 8 - 23 mg/dL 54(H) 53(H) 34(H)  Creatinine 0.61 - 1.24 mg/dL 3.62(H) 3.33(H) 1.63(H)  BUN/Creat Ratio 10 - 24 - - -  Sodium 135 - 145 mmol/L 135 135 135  Potassium 3.5 - 5.1 mmol/L 4.8 5.0 3.8  Chloride 98 - 111 mmol/L 108 108 109  CO2 22 - 32 mmol/L 14(L) 17(L) 20(L)  Calcium 8.9 - 10.3 mg/dL 7.9(L) 7.9(L) 7.8(L)    Assessment/Plan:  Principal Problem:   Mallory-Weiss tear Active Problems:   Seizure disorder (HCC)   Chronic anticoagulation   Internal hemorrhoid, bleeding   Perianal infection   Blood loss anemia   S/P AVR (aortic valve replacement)   Acute encephalopathy   Dementia associated with other underlying disease with behavioral disturbance [facility])  [name] is a 67 y.o male with a history of AS s/p mechanical valve replacement on Warfarin, epilepsys/p temporal lobectomy, and DM who presented to the ED with worsening rectal pain and bleeding.He was doing well until 10/05 when he subsequently developed acute upper GI bleed as outlined  below. Also complicated with altered mental status     Upper GI Bleed: No evidence of bleeding but patient is hypotensive and pale today. Has some mild abdominal tenderness on exam. IV fluid (1 liter bolus LR started) and gave 1 u RBC. GI is on board and I contact them again to consider seeing the patient soon to decide for EGD.  (S/p IV fluid, BP improved. We monitor patient and will follow GI recommendation) -Hold Warfarin -EGD today? GI was contacted to visit the patient and plan - H&H monitoring. Transfusion goal>7 -Avoid NSAIDs - Continue Pantoprazole 40 mg BIDfor 8 weeks per GI  In hospital AKI Elevated Cr today: 3.3. (BUN/Cr<20.) POC bladder US did not show extra residual urine at bladder. In hospital AKI, likely 2/2 recent GI bleed and hypotension. -1 li bolus LR -Monitor BMP    S/P AVR (aortic valve replacement): Was started with Warfarin Bridge 2 days ago. But concerning for GI bleeding.  -Keep holding warfarin due to drop in Hb. C/w IV Hep  -Monitor for bleeding -Monitor H&H twice a day  Hepatitis C. Stable Some abdominal pain today and tenderness on exam. No fever Abd. Ultrasound showed cirrhosis.  No specific focal lesions.  Portal vein is patent. Patient has leucocytosis today. No fever. [place] has been limited to RUQ and no statement  about ascitics.  *Consider SBP. Start with prophylaxis AB and may need abdominal US as soon as clear plan for endoscopy -Ceftriaxon 1 gr IV QD  Altered mental status, agitation  Progressive dementia secondary to previous temporal lobectomy due to seizure.  -Continue Seroquel 100 mg daily -Follow-up with neurology outpatient considering additional treatment required -Adjusted Keppra to 500 mg BID today due to AKI -Halodal PRN but caution as it decrease seizure threshold -Abdominal US today for evaluation for cirrhosis  Perianal and gluteal intertrigo. Had some pain today.  - Continuingclotrimazole creamBID  Internal  Hemorrhoids  - Continue hydrocortisone suppositories and Senokot   Dispo: Anticipated discharge in approximately 4-5 days. When hemodynamically stable and depends on clinical improvement   Dewayne Hatch, MD 01/22/2018, 10:56 AM Pager: [phone]

## 2018-01-22 NOTE — Consult Note (Signed)
NAME:  Adam Savage, MRN:  [id number], DOB:  March 16, 1951, LOS: 5 ADMISSION DATE:  01/15/2018, CONSULTATION DATE:  01/22/2018 REFERRING MD:  IMTS/ code team, CHIEF COMPLAINT:  Cardiac arrest  Brief History   68 yoM with PMH of AS s/p AVR with mechanical valve on coumadin presenting with hematesis, admitted 10/3 with UGIB.  Found on EGD to have a mallory weiss tear.  Was transitioned to heparin.  Overnight 10/9, patient developed worsening AMS, hypotension, with new AKI and Hgb down to 7.  Found unresponsive on floor in PEA arrest.  8-10 mins ACLS with ROSC and transferred to ICU, PCCM to assume care.    Past Medical History  AS s/p AVR with mechanical valve on coumadin, epilepsy s/p temporal lobectomy, DM, questionable dementia, advanced fibrosis with chronic HCV, CKD stage 3 Significant Hospital Events   10/3 Admitted  10/6 EDG 10/10 PEA arrest   Consults: date of consult/date signed off & final recs:  Admitted IMTS GI 10/6 Psych 10/6 Neurology 10/7  Procedures (surgical and bedside):  10/6 EGD 10/10 ETT >> 10/10 L [place] cordis >> 10/10 foley   Significant Diagnostic Tests:  10/6 EGD >> - LA Grade C reflux esophagitis.  Salmon-colored mucosa suspicious for short-segment Barrett's esophagus.  Mallory-Weiss tear likely etiology of upper GI hemorrhage. Gastritis.      - Normal examined duodenum.  10/3 CT pelvis >> No acute finding. No evidence of perianal collection or inflammation.  10/7 CTH >> Status post right temporal lobectomy with encephalomalacia throughout this area. There is mild periventricular small vessel disease. No acute infarct. No mass or hemorrhage.   There are foci of arterial vascular calcification. Extensive post craniotomy bone defects on the right.  Extensive paranasal sinus disease on the left. 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 There is rightward deviation of the nasal septum.  10/10 [place] abd  limited >> Surgical absence of the gallbladder. Cirrhotic changes in the liver.  Micro Data:  10/10 MRSA PCR >> 10/10 BC x2 >>  Antimicrobials:  01/17/18 ceftriaxone; 10/10 >>  Subjective:   Objective   Blood pressure (!) 80/56, pulse 85, temperature (!) 97.4 F (36.3 C), temperature source Axillary, resp. rate 14, height 6' (1.829 m), weight 125.6 kg, SpO2 100 %.        Intake/Output Summary (Last 24 hours) at 01/22/2018 1241 Last data filed at 01/22/2018 0600 Gross per 24 hour  Intake 470 ml  Output 250 ml  Net 220 ml   Filed Weights   01/15/18 1838  Weight: 125.6 kg    Examination: General:  Critically ill male intubated and unresponsive HEENT: pale, ETT, pupils 3/ reactive Neuro: unresponsive CV: RR, mechanical sound PULM: agonal on MV, coarse BS IO:MBTDH, soft, hypo BS Extremities: cool/dry, no BLE edema  Skin: no rashes, ecchymosis to [facility] Problem list    Assessment & Plan:  PEA arrest in the setting of presumed hemorraghic/ hypovolemic shock from UGIB on heparin gtt - 8-10 min ACLS prior to ROSC  P:  tx ICU Will place aline and cordis Additional 1L NS now 2 units, 2 FFP to be transfused and additional 2 units PRBC Levophed for MAP > 65 Trending troponin and assess EKG Consider TTE  Trend lactate Stat CBC, Renal panel, LFTs, coags Given instability and coagulopathy, will defer TTM for now, normothermia for next 48 hours   UGIB - EGD as above with esophagitis, non bleeding MW tear, gastritis, Barrett's esophagus  P:  GI following, seen this am, recommended abd Ct, will touch base given changes protonix gtt H/H q 6  Acute respiratory failure in the setting of cardiac arrest P:  Full MV support 8 cc/kg, rate 24 ABG and CXR now  VAP protocol   Acute worsening encephalopathy likely due to shock vs possible anoxic injury, some underlying dementia and seizures s/p previous temporal lobectomy  P:  Frequent neuro checks/  monitoring Prevent hyperthermia as above x 48 hours Prn sedation with PAD protocol as needed  Continue keppra  Hold seroquel   AKI Presumed severe metabolic acidosis  P:  Fluid resuscitation as above Insert foley Consider Renal U/S depending on UOP Repeat Renal function now Replace electrolytes as indicated Avoid nephrotoxic agents, ensure adequate renal perfusion  Coagulopathy in the setting of heparin for AVR/ mechanical valve P:  Holding heparin  Repeat coags now Blood products as above   ABD pain prior to arrest - prior RUQ US showed cirrhosis, no focal lesions, patent portal vein, no reported ascites - new leukocytolysis, some concern for possible SBP - continue ctx for now, consider d/c if neg PCT   P:  Trend WBC Assess PCT   DM P:  SSI sensitive CBG q 4 Continue lantus 15 u daily   Disposition / Summary of Today's Plan 01/22/18   tx to ICU for stabilization     Diet: NPO Pain/Anxiety/Delirium protocol (if indicated): hold to assess mental status VAP protocol (if indicated): yes DVT prophylaxis: SCDs only GI prophylaxis: PPI gtt Hyperglycemia protocol: CBG q 4 Mobility: BR Code Status: Full  Family Communication: family reported on the way  Labs   CBC: Recent Labs  Lab 01/18/18 0849  01/19/18 0606  01/20/18 0146  01/20/18 1821 01/21/18 0402 01/21/18 1109 01/21/18 2146 01/22/18 0710  WBC 17.2*  --  13.0*  --  10.6*  --   --  8.1  --   --  19.5*  HGB 7.3*   < > 8.4*   < > 8.3*   < > 8.8* 7.7* 7.3* 6.2* 7.0*  HCT 22.8*   < > 25.6*   < > 25.3*   < > 26.6* 24.6* 23.5* 19.2* 21.5*  MCV 84.8  --  84.2  --  85.5  --   --  85.7  --   --  87.0  PLT 183  --  168  --  150  --   --  152  --   --  180   < > = values in this interval not displayed.    Basic Metabolic Panel: Recent Labs  Lab 01/16/18 0550 01/19/18 0606 01/21/18 0655 01/22/18 0710 01/22/18 0847  NA 133* 137 135 135 135  K 4.2 4.4 3.8 5.0 4.8  CL 104 110 109 108 108  CO2 22 19*  20* 17* 14*  GLUCOSE 207* 159* 148* 223* 251*  BUN 36* 50* 34* 53* 54*  CREATININE 1.70* 1.61* 1.63* 3.33* 3.62*  CALCIUM 8.5* 8.1* 7.8* 7.9* 7.9*   GFR: Estimated Creatinine Clearance: 27.1 mL/min (A) (by C-G formula based on SCr of 3.62 mg/dL (H)). Recent Labs  Lab 01/15/18 1801  01/19/18 0606 01/20/18 0146 01/21/18 0402 01/22/18 0710  WBC 9.1   < > 13.0* 10.6* 8.1 19.5*  LATICACIDVEN 1.1  --   --   --   --   --    < > = values in this interval not displayed.    Liver Function Tests: Recent Labs  Lab  01/16/18 0550 01/21/18 0655  AST 45* 76*  ALT 34 49*  ALKPHOS 139* 104  BILITOT 0.8 1.4*  PROT 6.4* 4.9*  ALBUMIN 2.7* 2.3*   No results for input(s): LIPASE, AMYLASE in the last 168 hours. Recent Labs  Lab 01/19/18 1636  AMMONIA 39*    ABG    Component Value Date/Time   PHART 7.455 (H) 01/19/2018 1114   PCO2ART 32.9 01/19/2018 1114   PO2ART 75.9 (L) 01/19/2018 1114   HCO3 22.9 01/19/2018 1114   ACIDBASEDEF 0.6 01/19/2018 1114   O2SAT 96.4 01/19/2018 1114     Coagulation Profile: Recent Labs  Lab 01/18/18 0334 01/19/18 0606 01/20/18 0146 01/21/18 0402 01/22/18 0710  INR 2.47 1.62 1.63 1.47 1.97    Cardiac Enzymes: No results for input(s): CKTOTAL, CKMB, CKMBINDEX, TROPONINI in the last 168 hours.  HbA1C: Hgb A1c MFr Bld  Date/Time Value Ref Range Status  11/02/2017 04:41 AM 8.3 (H) 4.8 - 5.6 % Final    Comment:    (NOTE) Pre diabetes:          5.7%-6.4% Diabetes:              >6.4% Glycemic control for   <7.0% adults with diabetes   10/28/2017 01:13 PM 8.6 (H) 4.8 - 5.6 % Final    Comment:    (NOTE) Pre diabetes:          5.7%-6.4% Diabetes:              >6.4% Glycemic control for   <7.0% adults with diabetes     CBG: Recent Labs  Lab 01/21/18 1128 01/21/18 1643 01/21/18 2104 01/22/18 0733 01/22/18 1138  GLUCAP 180* 160* 185* 202* 148*    Admitting History of Present Illness.   55 yoM with PMH of AS s/p AVR with mechanical  valve on coumadin, DM, HCV, cirrhosis, seizures s/p previous temporal lobectomy and questionable developing dementia admitted on 10/3 with hematesis, admitted to IMTS with UGIB.  Found on EGD to have a non bleeding mallory weiss tear and esophagitis.  Was transitioned to heparin after procedure.  Overnight 10/9, patient developed worsening AMS, hypotension, with new AKI and Hgb down to 7.  Found unresponsive on floor in PEA arrest.  8-10 mins ACLS with ROSC and transferred to ICU, PCCM to assume care.  Review of Systems:   Unable to obtain as patient is unresponsive on MV   Past Medical History  He,  has a past medical history of COPD (chronic obstructive pulmonary disease) ([place]), Diabetes mellitus without complication ([place]), Mechanical heart valve present, Pre-diabetes, and Seizures ([place]).   Surgical History    Past Surgical History:  Procedure Laterality Date  . CARDIAC SURGERY    . CHOLECYSTECTOMY    . COLON SURGERY    . CORONARY/GRAFT ANGIOGRAPHY N/A 10/31/2017   Procedure: CORONARY/GRAFT ANGIOGRAPHY;  Surgeon: Nelva Bush, MD;  Location: [facility];  Service: Cardiovascular;  Laterality: N/A;  . ESOPHAGOGASTRODUODENOSCOPY (EGD) WITH PROPOFOL N/A 01/18/2018   Procedure: ESOPHAGOGASTRODUODENOSCOPY (EGD) WITH PROPOFOL;  Surgeon: Mauri Pole, MD;  Location: [facility];  Service: Endoscopy;  Laterality: N/A;  . JOINT REPLACEMENT    . KNEE SURGERY Left      Social History   Social History   Socioeconomic History  . Marital status: Married    Spouse name: Not on file  . Number of children: Not on file  . Years of education: Not on file  . Highest education level: Not on file  Occupational  History  . Not on file  Social Needs  . Financial resource strain: Not on file  . Food insecurity:    Worry: Not on file    Inability: Not on file  . Transportation needs:    Medical: Not on file    Non-medical: Not on file  Tobacco Use  . Smoking status: Current Every  Day Smoker    Packs/day: 1.00    Types: Cigarettes  . Smokeless tobacco: Never Used  Substance and Sexual Activity  . Alcohol use: Not Currently  . Drug use: Never  . Sexual activity: Not on file  Lifestyle  . Physical activity:    Days per week: Not on file    Minutes per session: Not on file  . Stress: Not on file  Relationships  . Social connections:    Talks on phone: Not on file    Gets together: Not on file    Attends religious service: Not on file    Active member of club or organization: Not on file    Attends meetings of clubs or organizations: Not on file    Relationship status: Not on file  . Intimate partner violence:    Fear of current or ex partner: Not on file    Emotionally abused: Not on file    Physically abused: Not on file    Forced sexual activity: Not on file  Other Topics Concern  . Not on file  Social History Narrative  . Not on file  ,  reports that he has been smoking cigarettes. He has been smoking about 1.00 pack per day. He has never used smokeless tobacco. He reports that he drank alcohol. He reports that he does not use drugs.   Family History   His family history includes Hypertension in his father.   Allergies Allergies  Allergen Reactions  . Morphine And Related Other (See Comments)    Combative      Home Medications  Prior to Admission medications   Medication Sig Start Date End Date Taking? Authorizing Provider  acetaminophen (TYLENOL) 650 MG CR tablet Take 1,300 mg by mouth every 8 (eight) hours as needed for pain.   Yes [provider]  albuterol (PROVENTIL HFA;VENTOLIN HFA) 108 (90 Base) MCG/ACT inhaler Inhale 2 puffs into the lungs every 6 (six) hours as needed for wheezing or shortness of breath. 12/25/17  Yes [name], MD  aspirin EC 81 MG tablet Take 81 mg by mouth daily.   Yes [provider]  atorvastatin (LIPITOR) 10 MG tablet Take 1 tablet (10 mg total) by mouth daily. 11/04/17 01/20/18 Yes Arrien,  Jimmy Picket, MD  carvedilol (COREG) 6.25 MG tablet Take 1 tablet (6.25 mg total) by mouth 2 (two) times daily with a meal. 11/27/17  Yes Jettie Booze, MD  clotrimazole (LOTRIMIN) 1 % cream Apply 1 application topically 2 (two) times daily. For 2 weeks 01/14/18  Yes Velna Ochs, MD  furosemide (LASIX) 20 MG tablet Take 3 tablets (60 mg total) by mouth daily. 12/25/17 12/25/18 Yes [name], MD  hydrocortisone (ANUSOL-HC) 25 MG suppository Place 1 suppository (25 mg total) rectally 2 (two) times daily as needed for hemorrhoids or anal itching. 01/14/18 01/14/19 Yes Velna Ochs, MD  insulin detemir (LEVEMIR) 100 UNIT/ML injection Inject 12 Units into the skin daily.   Yes [provider]  levETIRAcetam (KEPPRA) 1000 MG tablet Take 1 tablet (1,000 mg total) by mouth 2 (two) times daily. 11/14/17  Yes Delice Lesch,  Lezlie Octave, MD  lisinopril (PRINIVIL,ZESTRIL) 10 MG tablet Take 10 mg by mouth daily.   Yes [provider]  mirtazapine (REMERON) 30 MG tablet Take 30 mg by mouth at bedtime. 12/07/17  Yes [provider]  ramipril (ALTACE) 5 MG capsule Take 1 capsule (5 mg total) by mouth daily. 11/27/17  Yes Jettie Booze, MD  ranitidine (ZANTAC) 150 MG tablet Take 150 mg by mouth 2 (two) times daily.  05/01/17  Yes [provider]  tamsulosin (FLOMAX) 0.4 MG CAPS capsule Take 0.4 mg by mouth at bedtime.  05/01/17  Yes [provider]  traZODone (DESYREL) 100 MG tablet Take 200 mg by mouth at bedtime. 10/15/17  Yes [provider]  warfarin (COUMADIN) 5 MG tablet Take 5 mg by mouth See admin instructions. Take one tablet (5 mg) by mouth Monday, Tuesday, Wednesday, Friday, Saturday nights - 9pm 05/02/17  Yes [provider]  warfarin (COUMADIN) 7.5 MG tablet Take 7.5 mg by mouth See admin instructions. Take one tablet (7.5 mg) by mouth on Sunday and Thursday nights - 9pm (take 5 mg tablet on all other days of the week) 05/01/17  Yes  [provider]  blood glucose meter kit and supplies Dispense based on patient and insurance preference. Use up to four times daily as directed. (FOR ICD-10 E10.9, E11.9). 11/04/17   Arrien, Jimmy Picket, MD  glucose blood test strip Use as instructed 01/13/18   [name], MD  Lancets MISC 1 Units by Does not apply route 4 (four) times daily -  with meals and at bedtime. 01/13/18   [name], MD    CCT 60 mins  Kennieth Rad, [facility] Pgr: [phone] or if no answer [phone] 01/22/2018, 1:45 PM

## 2018-01-22 NOTE — Code Documentation (Signed)
  Patient Name: Adam Savage   MRN: [id number]   Date of Birth/ Sex: [birth year] , male      Admission Date: 01/15/2018  Attending Provider: Sampson Goon, MD  Primary Diagnosis: Mallory-Weiss tear   Indication: Pt was in his usual state of health until this AM, when he was noted to be PEA. Code blue was subsequently called. At the time of arrival on scene, ACLS protocol was underway.   Technical Description:  - CPR performance duration:  8-10 minutes  - Was defibrillation or cardioversion used? No   - Was external pacer placed? No  - Was patient intubated pre/post CPR? Yes   Medications Administered: Y = Yes; Blank = No Amiodarone    Atropine    Calcium    Epinephrine  x3  Lidocaine    Magnesium    Norepinephrine    Phenylephrine    Sodium bicarbonate  x1  Vasopressin     Post CPR evaluation:  - Final Status - Was patient successfully resuscitated ? Yes - What is current rhythm? Sinus - What is current hemodynamic status? Stable  Miscellaneous Information:  - Labs sent, including: BMP, ABG, CXR  - Primary team notified?  Yes  - Family Notified? Yes  - Additional notes/ transfer status: Care transferred to ICU team. Family updated.      Ina Homes, MD  01/22/2018, 1:34 PM

## 2018-01-22 NOTE — Anesthesia Postprocedure Evaluation (Signed)
Anesthesia Post Note  Patient: Adam Savage  Procedure(s) Performed: ESOPHAGOGASTRODUODENOSCOPY (EGD) WITH PROPOFOL (N/A )     Patient location during evaluation: PACU Anesthesia Type: MAC Level of consciousness: awake and alert Pain management: pain level controlled Vital Signs Assessment: post-procedure vital signs reviewed and stable Respiratory status: spontaneous breathing, nonlabored ventilation, respiratory function stable and patient connected to nasal cannula oxygen Cardiovascular status: stable and blood pressure returned to baseline Postop Assessment: no apparent nausea or vomiting Anesthetic complications: no    Last Vitals:  Vitals:   01/22/18 0515 01/22/18 0712  BP: 100/64 95/60  Pulse: 71 81  Resp: 18 (!) 24  Temp: 36.6 C 36.4 C  SpO2:  100%    Last Pain:  Vitals:   01/22/18 0712  TempSrc: Axillary  PainSc:                  Marialy Urbanczyk

## 2018-01-22 NOTE — Progress Notes (Signed)
Internal Medicine Attending:   I saw and examined the patient. I reviewed the resident's note and I agree with the resident's findings and plan as documented in the resident's note.  During AM rounds, patient found to again to be delirus not able to follow any commands.  On exam: skin pale, tachypneic, diffuse abdominal tenderness and increased distension. Heart RRR, lungs grossly clear from anterior fields.  Limited POCUS: right kidney visualized no hydronephrosis, no apparent fluid in morrison's pouch, no ascites visualized in right abdominal gutter, left kidney unable to visualize due to patient movement  A/P ABLA, Mallory weiss tear - Hgb drop and hypotension concerning for recurrent GI bleed, we called GI back, obtaining CT abd/pelvis.  AKI - SCr has doubled -Likely due to hypotension overnight - IVF ordered, blood transfusion  After evaluation, subesuqent BP lower he went into PEA arrest, CODE team responded and achieved ROSC after about 10 minutes.  Patient now transferred to ICU and care taken over by PCCM, I did discuss the events of the last 24 hours with patients daughter and son in law.

## 2018-01-22 NOTE — Progress Notes (Signed)
RT ;note- Increased VT to match 8cc/kg.

## 2018-01-22 NOTE — Progress Notes (Signed)
Responded to code blue to support staff.  Patient recovered.  I made call to patient's daughter and patients doctor spoke directly to daughter and gave update of patient condition.   Will follow as needed.  Jaclynn Major, [place], [facility], Pager [phone]

## 2018-01-22 NOTE — Progress Notes (Signed)
PT Cancellation Note  Patient Details Name: Adam Savage MRN: [id number] DOB: 08/17/1950   Cancelled Treatment:     Pt with confusion/agitation this morning just fell asleep, low Hgb and pending procedure this morning. Will hold for now and cont to follow.   Reinaldo Berber, PT, DPT Acute Rehabilitation Services Pager: [phone] Office: [phone]     Reinaldo Berber 01/22/2018, 10:22 AM

## 2018-01-22 NOTE — Anesthesia Procedure Notes (Addendum)
Procedure Name: Intubation Performed by: Milford Cage, CRNA Pre-anesthesia Checklist: Patient identified, Emergency Drugs available, Suction available, Patient being monitored and Timeout performed Patient Re-evaluated:Patient Re-evaluated prior to induction Oxygen Delivery Method: Ambu bag Preoxygenation: Pre-oxygenation with 100% oxygen Ventilation: Mask ventilation without difficulty Laryngoscope Size: Glidescope and 4 (Glidscope GO) Grade View: Grade III Tube type: Subglottic suction tube Tube size: 7.5 mm Number of attempts: 2 Airway Equipment and Method: Video-laryngoscopy and Rigid stylet Placement Confirmation: ETT inserted through vocal cords under direct vision,  breath sounds checked- equal and bilateral and CO2 detector Secured at: 24 cm Tube secured with: Tape Dental Injury: Teeth and Oropharynx as per pre-operative assessment

## 2018-01-22 NOTE — Progress Notes (Signed)
CRITICAL VALUE ALERT  Critical Value:  LAC 11.4  Date & Time Notied:  01/22/2018 @ 1500  Provider Notified: Dr. Pearline Cables  Orders Received/Actions taken: no orders received

## 2018-01-22 NOTE — Progress Notes (Signed)
Patient remains in restraints and has sitter. CSW will continue to follow.  Thurmond Butts, [place] Social Worker [phone]

## 2018-01-22 NOTE — Progress Notes (Addendum)
ANTICOAGULATION CONSULT NOTE - Follow Up Consult  Pharmacy Consult for heparin and warfarin  Indication: mechanical AVR  Labs: Recent Labs    01/20/18 0146  01/21/18 0402 01/21/18 0655 01/21/18 1109 01/21/18 2146 01/22/18 0710  HGB 8.3*   < > 7.7*  --  7.3* 6.2* 7.0*  HCT 25.3*   < > 24.6*  --  23.5* 19.2* 21.5*  PLT 150  --  152  --   --   --  180  LABPROT 19.2*  --  17.7*  --   --   --  22.3*  INR 1.63  --  1.47  --   --   --  1.97  HEPARINUNFRC 0.69   < > 0.49  --  0.38  --  0.37  CREATININE  --   --   --  1.63*  --   --  3.33*   < > = values in this interval not displayed.    Assessment: 67yo male S/p EGD on 10/6 which showed likely source of bleed as Mallory-Weiss tear. GI okayed restart warfarin on 10/7 if no further bleeding, however the IMTS team decided to wait until 10/8 to resumed. Coumadin was resumed 01/20/18.  Hgb drop to 7.3 and GI/primary holding warfarin at this time d/t concern for bleed, continue heparin gtt.    Heparin level therapeutic at 0.37, Hgb down to 7, plts wnl, primary f/u signs of repeat bleeding INR slightly subtherapeutic at 1.97, last dose 10/8, warfarin remains on hold  Goal of Therapy:  Heparin level 0.3-0.5 units/ml  INR 2-3 per [facility] clinic   Plan:  Continue heparin gtt at 850 units/hr Hold warfarin Daily INR, heparin level, CBC, s/s bleeding F/u CBC and bleed workup  Bertis Ruddy, PharmD Clinical Pharmacist Please check AMION for all [place] numbers 01/22/2018 9:27 AM

## 2018-01-22 NOTE — Progress Notes (Addendum)
Critical Value- Lactic Acid 3.9. MD Elsworth Soho notified. Last two CBGS 238 & 263, MD notified. Verbal orders to give Lantus STAT with Novolog dose. Per MD-  continue to monitor CBG q4, does not want to start insulin gtt at this time. Insulin sliding scale increased by MD.   -Pt very  restless/ agitated/ aggressive towards staff & unsynchronized with ventilator, RASS 2-4, RR 31-36, despite PRN versed & fentanyl inj. MD Elsworth Soho notified- verbal orders given to start  fentanyl gtt if unable to maintain RASS goal (0,-1). RN will continue to monitor.    @2330 - RN Notified Elink MD CBG 276- MD verbal order- give 0000 Novolog sliding scale dose & 11 additional units of Novolog. No insulin gtt ordered at this time. - RN request to renew bilateral wrist restraints r/t Pt physically aggressive towards staff (trying to hit/ grab staff), despite attempts to redirect.  Pt also trying to remove equipment, pulling at IV  lines, & ET tube, & trying to get OOB. Restraints renewed per MD.

## 2018-01-22 NOTE — Progress Notes (Addendum)
Adam Savage     [facility] Progress Note  CC:  Confused  Subjective: Not answering questions for me this morning. Grimacing with pain and tries to move my hands away during my abdominal exam. No family was present at the time of my evaluation.    Objective:  Reviewed with bedside nurse. No BM noted since the smear yesterday. No GI complaints reported by the patient.  Vital signs in last 24 hours: Temp:  [97.5 F (36.4 C)-98.9 F (37.2 C)] 97.5 F (36.4 C) (10/10 1101) Pulse Rate:  [68-93] 80 (10/10 1101) Resp:  [18-26] 26 (10/10 1101) BP: (80-112)/(40-83) 80/56 (10/10 1101) SpO2:  [100 %] 100 % (10/10 1101) Last BM Date: 01/20/18 General:  More somnolent today.  Arousable. Moves my hands away during my abdominal exam. Not answering questions. NAD.  Heart:  Regular rate and rhythm; no murmurs Abdomen:  Soft, diffusely tender with some tympany. Hypoactive bowel sounds. + guarding. Unable to assess rebound.  Neurologic:  Arousable.  Psych:  Confused.  Intake/Output from previous day: 10/09 0701 - 10/10 0700 In: 755 [P.O.:360; I.V.:80; Blood:315] Out: 250 [Urine:250] Intake/Output this shift: No intake/output data recorded.  Lab Results: Recent Labs    01/20/18 0146  01/21/18 0402 01/21/18 1109 01/21/18 2146 01/22/18 0710  WBC 10.6*  --  8.1  --   --  19.5*  HGB 8.3*   < > 7.7* 7.3* 6.2* 7.0*  HCT 25.3*   < > 24.6* 23.5* 19.2* 21.5*  PLT 150  --  152  --   --  180   < > = values in this interval not displayed.   BMET Recent Labs    01/21/18 0655 01/22/18 0710 01/22/18 0847  NA 135 135 135  K 3.8 5.0 4.8  CL 109 108 108  CO2 20* 17* 14*  GLUCOSE 148* 223* 251*  BUN 34* 53* 54*  CREATININE 1.63* 3.33* 3.62*  CALCIUM 7.8* 7.9* 7.9*   LFT Recent Labs    01/21/18 0655  PROT 4.9*  ALBUMIN 2.3*  AST 76*  ALT 49*  ALKPHOS 104  BILITOT 1.4*   PT/INR Recent Labs    01/21/18 0402 01/22/18 0710  LABPROT 17.7* 22.3*  INR 1.47 1.97    Hepatitis Panel No results for input(s): HEPBSAG, HCVAB, HEPAIGM, HEPBIGM in the last 72 hours.  US Abdomen Limited  Result Date: 01/22/2018 CLINICAL DATA:  Cirrhosis. Elevated liver function studies and elevated bilirubin. Previous cholecystectomy. EXAM: ULTRASOUND ABDOMEN LIMITED RIGHT UPPER QUADRANT COMPARISON:  None. FINDINGS: Gallbladder: Gallbladder is surgically absent. Common bile duct: Diameter: 3.4 mm, normal Liver: Diffuse coarsening of liver parenchymal echotexture with nodular contour compatible with history of cirrhosis. Limited visualization but no specific focal lesions are identified. Portal vein is patent on color Doppler imaging with normal direction of blood flow towards the liver. IMPRESSION: Surgical absence of the gallbladder. Cirrhotic changes in the liver. Electronically Signed   By: Lucienne Capers M.D.   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 Linus Salmons, ID    - previously treated with interferon-based therapy (>2009)    -  Fibrosis score F3-F4 - ultrasound and elastography pending - no portal hypertension noted at the time of EGD Chronic warfarin for a mechanical AVR - single dose of warfarin given 01/20/18 Acute renal insufficiency today superimposed on stage III chronic kidney disease    - creatinine up to 3.62 with concurrent rise in BUN Prior sigmoid colectomy for unclear reason 2013 or 2014 Epilepsy s/p temporal lobectomy DM  Progressive decline in hemoglobin despite no overt bleeding. No true  BM in 3-4 days.  Nadir hemoglobin at 6.2 last night. INR now 1.97.  Downtrend in his blood pressure. Heart rate 65-93. Most recently 53.  Now with diffuse abdominal pain.   Recommend: abdominal CT today. Anemia labs.   Continue Protonix 40 mg PO BID. NPO.  Continue to hold warfarin.   Thornton Park, MD, MPH [facility]    LOS: 5 days   Thornton Park  01/22/2018, 11:45 AM

## 2018-01-22 NOTE — Progress Notes (Signed)
Patient agitated for MRI.  Please recall as needed when MRI done if report is concerning for any acute abnormality. Otherwise no new recs, other than that from last note.  -- Amie Portland, MD [facility] Pager: [phone] If 7pm to 7am, please call on call as listed on AMION.

## 2018-01-22 NOTE — Progress Notes (Signed)
RT NOTES: Responded to code blue. CPR being performed. Patient bagged with 100% fio2. Patient intubated by CRNA. Patient being transferred to [facility].

## 2018-01-23 ENCOUNTER — Inpatient Hospital Stay (HOSPITAL_COMMUNITY): Payer: Medicare Other

## 2018-01-23 DIAGNOSIS — D6489 Other specified anemias: Secondary | ICD-10-CM

## 2018-01-23 LAB — BLOOD GAS, ARTERIAL
Acid-base deficit: 5.9 mmol/L — ABNORMAL HIGH (ref 0.0–2.0)
Bicarbonate: 17.9 mmol/L — ABNORMAL LOW (ref 20.0–28.0)
FIO2: 50
MECHVT: 620 mL
O2 Saturation: 97.2 %
PEEP: 5 cmH2O
Patient temperature: 98.6
RATE: 24 resp/min
pCO2 arterial: 28.8 mmHg — ABNORMAL LOW (ref 32.0–48.0)
pH, Arterial: 7.409 (ref 7.350–7.450)
pO2, Arterial: 93.4 mmHg (ref 83.0–108.0)

## 2018-01-23 LAB — CBC WITH DIFFERENTIAL/PLATELET
Abs Immature Granulocytes: 0.39 10*3/uL — ABNORMAL HIGH (ref 0.00–0.07)
Basophils Absolute: 0 10*3/uL (ref 0.0–0.1)
Basophils Absolute: 0 10*3/uL (ref 0.0–0.1)
Basophils Relative: 0 %
Basophils Relative: 0 %
Eosinophils Absolute: 0 10*3/uL (ref 0.0–0.5)
Eosinophils Absolute: 0.1 10*3/uL (ref 0.0–0.5)
Eosinophils Relative: 0 %
Eosinophils Relative: 0 %
HCT: 33.1 % — ABNORMAL LOW (ref 39.0–52.0)
HCT: 32.1 % — ABNORMAL LOW (ref 39.0–52.0)
Hemoglobin: 11.2 g/dL — ABNORMAL LOW (ref 13.0–17.0)
Hemoglobin: 10.8 g/dL — ABNORMAL LOW (ref 13.0–17.0)
Immature Granulocytes: 1 %
Lymphocytes Relative: 4 %
Lymphocytes Relative: 8 %
Lymphs Abs: 1.4 10*3/uL (ref 0.7–4.0)
Lymphs Abs: 2.1 10*3/uL (ref 0.7–4.0)
MCH: 27.3 pg (ref 26.0–34.0)
MCH: 27.7 pg (ref 26.0–34.0)
MCHC: 33.8 g/dL (ref 30.0–36.0)
MCHC: 33.6 g/dL (ref 30.0–36.0)
MCV: 80.5 fL (ref 80.0–100.0)
MCV: 82.3 fL (ref 80.0–100.0)
Monocytes Absolute: 2.4 10*3/uL — ABNORMAL HIGH (ref 0.1–1.0)
Monocytes Absolute: 2.7 10*3/uL — ABNORMAL HIGH (ref 0.1–1.0)
Monocytes Relative: 7 %
Monocytes Relative: 10 %
Myelocytes: 1 %
Neutro Abs: 30.8 10*3/uL — ABNORMAL HIGH (ref 1.7–7.7)
Neutro Abs: 22.7 10*3/uL — ABNORMAL HIGH (ref 1.7–7.7)
Neutrophils Relative %: 88 %
Neutrophils Relative %: 81 %
Platelets: 146 10*3/uL — ABNORMAL LOW (ref 150–400)
Platelets: 79 10*3/uL — ABNORMAL LOW (ref 150–400)
RBC: 4.11 MIL/uL — ABNORMAL LOW (ref 4.22–5.81)
RBC: 3.9 MIL/uL — ABNORMAL LOW (ref 4.22–5.81)
RDW: 16.5 % — ABNORMAL HIGH (ref 11.5–15.5)
RDW: 16.6 % — ABNORMAL HIGH (ref 11.5–15.5)
WBC: 34.6 10*3/uL — ABNORMAL HIGH (ref 4.0–10.5)
WBC: 28 10*3/uL — ABNORMAL HIGH (ref 4.0–10.5)
nRBC: 0 /100 WBC
nRBC: 0.2 % (ref 0.0–0.2)
nRBC: 0.1 % (ref 0.0–0.2)

## 2018-01-23 LAB — MRSA PCR SCREENING: MRSA by PCR: NEGATIVE

## 2018-01-23 LAB — CBC
HCT: 37.1 % — ABNORMAL LOW (ref 39.0–52.0)
Hemoglobin: 12.6 g/dL — ABNORMAL LOW (ref 13.0–17.0)
MCH: 27.2 pg (ref 26.0–34.0)
MCHC: 34 g/dL (ref 30.0–36.0)
MCV: 80.1 fL (ref 80.0–100.0)
Platelets: 186 10*3/uL (ref 150–400)
RBC: 4.63 MIL/uL (ref 4.22–5.81)
RDW: 15.8 % — ABNORMAL HIGH (ref 11.5–15.5)
WBC: 43.1 10*3/uL — ABNORMAL HIGH (ref 4.0–10.5)
nRBC: 0.4 % — ABNORMAL HIGH (ref 0.0–0.2)

## 2018-01-23 LAB — GLUCOSE, CAPILLARY
Glucose-Capillary: 276 mg/dL — ABNORMAL HIGH (ref 70–99)
Glucose-Capillary: 239 mg/dL — ABNORMAL HIGH (ref 70–99)
Glucose-Capillary: 189 mg/dL — ABNORMAL HIGH (ref 70–99)
Glucose-Capillary: 182 mg/dL — ABNORMAL HIGH (ref 70–99)
Glucose-Capillary: 186 mg/dL — ABNORMAL HIGH (ref 70–99)
Glucose-Capillary: 188 mg/dL — ABNORMAL HIGH (ref 70–99)

## 2018-01-23 LAB — BPAM FFP
Blood Product Expiration Date: 201910142359
Blood Product Expiration Date: 201910142359
ISSUE DATE / TIME: 201910101258
ISSUE DATE / TIME: 201910101258
Unit Type and Rh: 6200
Unit Type and Rh: 6200

## 2018-01-23 LAB — TSH: TSH: 3.696 u[IU]/mL (ref 0.350–4.500)

## 2018-01-23 LAB — PREPARE FRESH FROZEN PLASMA

## 2018-01-23 LAB — RENAL FUNCTION PANEL
Albumin: 2.4 g/dL — ABNORMAL LOW (ref 3.5–5.0)
Anion gap: 12 (ref 5–15)
BUN: 70 mg/dL — ABNORMAL HIGH (ref 8–23)
CO2: 16 mmol/L — ABNORMAL LOW (ref 22–32)
Calcium: 7.5 mg/dL — ABNORMAL LOW (ref 8.9–10.3)
Chloride: 107 mmol/L (ref 98–111)
Creatinine, Ser: 4.1 mg/dL — ABNORMAL HIGH (ref 0.61–1.24)
GFR calc Af Amer: 16 mL/min — ABNORMAL LOW (ref >60)
GFR calc non Af Amer: 14 mL/min — ABNORMAL LOW (ref >60)
Glucose, Bld: 235 mg/dL — ABNORMAL HIGH (ref 70–99)
Phosphorus: 5.5 mg/dL — ABNORMAL HIGH (ref 2.5–4.6)
Potassium: 4.4 mmol/L (ref 3.5–5.1)
Sodium: 135 mmol/L (ref 135–145)

## 2018-01-23 LAB — PROCALCITONIN: Procalcitonin: 1.98 ng/mL

## 2018-01-23 LAB — CORTISOL: Cortisol, Plasma: 29.4 ug/dL

## 2018-01-23 LAB — PROTIME-INR
INR: 1.6
Prothrombin Time: 18.9 seconds — ABNORMAL HIGH (ref 11.4–15.2)

## 2018-01-23 LAB — MAGNESIUM: Magnesium: 2.2 mg/dL (ref 1.7–2.4)

## 2018-01-23 LAB — TROPONIN I: Troponin I: 0.04 ng/mL (ref <0.03)

## 2018-01-23 LAB — AMMONIA: Ammonia: 55 umol/L — ABNORMAL HIGH (ref 9–35)

## 2018-01-23 LAB — T4, FREE: Free T4: 1.16 ng/dL (ref 0.82–1.77)

## 2018-01-23 LAB — HAPTOGLOBIN: Haptoglobin: <10 mg/dL — ABNORMAL LOW (ref 34–200)

## 2018-01-23 MED ORDER — ZOLPIDEM TARTRATE 5 MG PO TABS
5.0000 mg | ORAL_TABLET | Freq: Every evening | ORAL | Status: DC | PRN
  Administered 2018-01-23: 5 mg via ORAL
  Filled 2018-01-23: qty 1

## 2018-01-23 MED ORDER — PIPERACILLIN-TAZOBACTAM 3.375 G IVPB
3.3750 g | Freq: Three times a day (TID) | INTRAVENOUS | Status: DC
  Administered 2018-01-23 – 2018-01-26 (×10): 3.375 g via INTRAVENOUS
  Filled 2018-01-23 (×8): qty 50

## 2018-01-23 MED ORDER — LACTATED RINGERS IV SOLN
INTRAVENOUS | Status: DC
  Administered 2018-01-23: 17:00:00 via INTRAVENOUS

## 2018-01-23 MED ORDER — ORAL CARE MOUTH RINSE
15.0000 mL | Freq: Two times a day (BID) | OROMUCOSAL | Status: DC
  Administered 2018-01-23 – 2018-01-30 (×9): 15 mL via OROMUCOSAL

## 2018-01-23 NOTE — Progress Notes (Signed)
Daily Rounding Note  01/23/2018, 8:14 AM  LOS: 6 days   SUBJECTIVE:   Chief complaint: gi bleed, anemia    Pt remains intubated but not sedated.   gettng agitated.  No further BM's. Levophed overnight but pressures good this AM  OBJECTIVE:         Vital signs in last 24 hours:    Temp:  [96 F (35.6 C)-100.3 F (37.9 C)] 97.5 F (36.4 C) (10/11 0715) Pulse Rate:  [38-86] 65 (10/11 0700) Resp:  [12-35] 24 (10/11 0700) BP: (58-111)/(31-70) 92/41 (10/11 0400) SpO2:  [95 %-100 %] 99 % (10/11 0700) Arterial Line BP: (57-136)/(40-73) 106/54 (10/11 0700) FiO2 (%):  [50 %-100 %] 50 % (10/11 0400) Last BM Date: 01/15/18 Filed Weights   01/15/18 1838  Weight: 125.6 kg   General: looks the same except that he is agitated   Did not manually reexamine pt Heart: RRR Chest: no labored breathing Abdomen: ND  Extremities: + Edema Neuro/Psych:  Alert, agitated. Moves all 4 limbs.    Intake/Output from previous day: 10/10 0701 - 10/11 0700 In: 3998.3 [P.O.:36; I.V.:2034.6; Blood:1677; IV Piggyback:230.8] Out: 405 [Urine:405]  Intake/Output this shift: No intake/output data recorded.  Lab Results: Recent Labs    01/22/18 1803 01/22/18 1951 01/23/18 0310  WBC 39.2* 41.0* 43.1*  HGB 12.2* 12.2* 12.6*  HCT 37.5* 37.3* 37.1*  PLT 169 170 186   BMET Recent Labs    01/22/18 0847 01/22/18 1246 01/22/18 1533 01/23/18 0310  NA 135 139 143 135  K 4.8 5.7* 4.1 4.4  CL 108 109  --  107  CO2 14* 13*  --  16*  GLUCOSE 251* 132* 141* 235*  BUN 54* 52*  --  70*  CREATININE 3.62* 3.95*  --  4.10*  CALCIUM 7.9* 7.7*  --  7.5*   LFT Recent Labs    01/21/18 0655 01/22/18 1246 01/22/18 1301 01/23/18 0310  PROT 4.9*  --  3.9*  --   ALBUMIN 2.3* 1.8* 1.8* 2.4*  AST 76*  --  152*  --   ALT 49*  --  88*  --   ALKPHOS 104  --  109  --   BILITOT 1.4*  --  1.6*  --   BILIDIR  --   --  0.7*  --   IBILI  --   --  0.9  --     PT/INR Recent Labs    01/22/18 1301 01/22/18 1951  LABPROT 29.0* 20.0*  INR 2.76 1.71   Hepatitis Panel No results for input(s): HEPBSAG, HCVAB, HEPAIGM, HEPBIGM in the last 72 hours.  Studies/Results: Ct Abdomen Pelvis Wo Contrast  Result Date: 01/22/2018 CLINICAL DATA:  Anemia, dropping hemoglobin. EXAM: CT ABDOMEN AND PELVIS WITHOUT CONTRAST TECHNIQUE: Multidetector CT imaging of the abdomen and pelvis was performed following the standard protocol without IV contrast. COMPARISON:  CT pelvis 01/15/2018 FINDINGS: Lower chest: Small bilateral effusions and bibasilar atelectasis. Heart is enlarged. Hepatobiliary: No focal liver abnormality is seen. Status post cholecystectomy. No biliary dilatation. Pancreas: No focal abnormality or ductal dilatation. Spleen: No focal abnormality.  Normal size. Adrenals/Urinary Tract: Mild fullness of the right renal collecting system and proximal ureter, possibly related to mass effect from the retroperitoneal hematoma described below. Perinephric stranding around the right kidney. No hydronephrosis on the left. Adrenal glands unremarkable. Foley catheter in place with decompressed bladder. Stomach/Bowel: Postoperative changes in the sigmoid colon. Stomach, large and small bowel grossly unremarkable.  Vascular/Lymphatic: Diffuse aortic and iliac calcifications. No evidence of aneurysm or adenopathy. Reproductive: No visible focal abnormality. Other: Large right retroperitoneal hematoma. This measures 13.4 x 10.5 cm and extends over a craniocaudal length of 17 cm. This extends also into the extraperitoneal space anterior to the bladder in the lower pelvis. Musculoskeletal: No acute bony abnormality. IMPRESSION: Large right retroperitoneal hematoma extending from posterior to the lower pole of the right kidney into the pelvis and into the extraperitoneal space in the lower pelvis. This measures 17 x 13.4 x 10.5 cm. Mild fullness of the right renal collecting system  and proximal ureter may be related to mass effect from the large retroperitoneal hematoma. No visible obstructing stones. Small bilateral pleural effusions with bibasilar atelectasis. Electronically Signed   By: Rolm Baptise M.D.   On: 01/22/2018 17:31   US Abdomen Limited  Result Date: 01/22/2018 CLINICAL DATA:  Cirrhosis. Elevated liver function studies and elevated bilirubin. Previous cholecystectomy. EXAM: ULTRASOUND ABDOMEN LIMITED RIGHT UPPER QUADRANT COMPARISON:  None. FINDINGS: Gallbladder: Gallbladder is surgically absent. Common bile duct: Diameter: 3.4 mm, normal Liver: Diffuse coarsening of liver parenchymal echotexture with nodular contour compatible with history of cirrhosis. Limited visualization but no specific focal lesions are identified. Portal vein is patent on color Doppler imaging with normal direction of blood flow towards the liver. IMPRESSION: Surgical absence of the gallbladder. Cirrhotic changes in the liver. Electronically Signed   By: Lucienne Capers M.D.   On: 01/22/2018 04:56   Dg Chest Port 1 View  Result Date: 01/22/2018 CLINICAL DATA:  Line placement. EXAM: PORTABLE CHEST 1 VIEW COMPARISON:  12/21/2017 FINDINGS: Endotracheal to is 6 cm above the carina. Left subclavian vascular sheath is in place with the tip likely in the left innominate vein. Cardiomegaly. Bibasilar atelectasis. No effusions. Old left distal clavicle fracture. No acute bony abnormality. IMPRESSION: Cardiomegaly.  Bibasilar atelectasis. Endotracheal tube 6 cm above the carina. Left subclavian vascular sheath without pneumothorax. The tip is in the left innominate vein. Electronically Signed   By: Rolm Baptise M.D.   On: 01/22/2018 13:28   Scheduled Meds: . atorvastatin  10 mg Oral Daily  . chlorhexidine gluconate (MEDLINE KIT)  15 mL Mouth Rinse BID  . clotrimazole  1 application Topical BID  . insulin aspart  0-20 Units Subcutaneous Q4H  . insulin glargine  15 Units Subcutaneous QHS  .  levETIRAcetam  500 mg Oral BID  . mouth rinse  15 mL Mouth Rinse 10 times per day  . nicotine  7 mg Transdermal Daily  . [START ON 01/26/2018] pantoprazole  40 mg Intravenous Q12H  . senna-docusate  1 tablet Oral BID   Continuous Infusions: . fentaNYL infusion INTRAVENOUS 125 mcg/hr (01/23/18 0700)  . norepinephrine (LEVOPHED) Adult infusion 35 mcg/min (01/23/18 0700)  . pantoprozole (PROTONIX) infusion 8 mg/hr (01/23/18 0700)  . piperacillin-tazobactam (ZOSYN)  IV     PRN Meds:.albuterol, fentaNYL (SUBLIMAZE) injection, midazolam, midazolam   ASSESMENT:   *   Large RP hematoma.  Explains progressive anemia and decompensation.    *   PEA arrest.  Remains intubated.    *Upper GI bleed, hematemesis, melena.Resolved. 01/18/2018 EGD.LA grade C esophagitis.Nonbleeding MWT with stigmata of recent bleeding no VV.Distal esophageal salmon-colored mucosa,c/wshort segment Barrett's (not biopsied). Gastritis.   No signs of portal hypertension or varices. Remains on Protonix 40 mg p.o. twice daily  *Acute on chronic anemia due to GI blood loss. good response to multiple PRBCs. Hgb overall stable. MCV normal.  *Chronic hepatitis C.Previous interferon-based  therapy 10 or more years ago (so 2009 or earlier). HCV quant of 243,000 on 12/22/17. Fibrosis score F3-F4.  Dr Linus Salmons from ID follows. He ordered outpt ultrasound, elastography, scheduled [facility] at [facility]. These are done in outpt setting, confirmed with [place] techs today. Will need to have this rescheduled.  At some point had liver bx in [place], results said to be "inconclusive", "can not r/o cirrhosis"  *   AKI, CKD.    *S/P mechanical AVR, on chronic Coumadin.IV heparinin place, restarted Coumadin 10/8, again discontinued.  INR 1.7 after FFP, Vit K.    *   AMS, delerium.  Multifactorial.   Ammonia level just 39. Seizures, previous temporal lobectomy to address seizures.     *   Previous sigmoid  colectomy for unclear reason.  2013 vs 2014.     PLAN   *   GI will sign off.  Can fup in office in 6 to 8 weeks (Dr Silverio [name]).     *   PPI BID for 8 weeks.   EGD in ~ 6 months to eval the possible Barretts.       Azucena Freed  01/23/2018, 8:14 AM Phone [phone]

## 2018-01-23 NOTE — Progress Notes (Signed)
Rectal tube removed. Smear noted, no blood noted.

## 2018-01-23 NOTE — Evaluation (Signed)
Clinical/Bedside Swallow Evaluation Patient Details  Name: Adam Savage MRN: [id number] Date of Birth: December 27, 1950  Today's Date: 01/23/2018 Time: SLP Start Time (ACUTE ONLY): 2094 SLP Stop Time (ACUTE ONLY): 1539 SLP Time Calculation (min) (ACUTE ONLY): 26 min  Past Medical History:  Past Medical History:  Diagnosis Date  . COPD (chronic obstructive pulmonary disease) ()   . Diabetes mellitus without complication ([place])   . Mechanical heart valve present   . Pre-diabetes   . Seizures ([place])    Past Surgical History:  Past Surgical History:  Procedure Laterality Date  . CARDIAC SURGERY    . CHOLECYSTECTOMY    . COLON SURGERY    . CORONARY/GRAFT ANGIOGRAPHY N/A 10/31/2017   Procedure: CORONARY/GRAFT ANGIOGRAPHY;  Surgeon: Nelva Bush, MD;  Location: [facility];  Service: Cardiovascular;  Laterality: N/A;  . ESOPHAGOGASTRODUODENOSCOPY (EGD) WITH PROPOFOL N/A 01/18/2018   Procedure: ESOPHAGOGASTRODUODENOSCOPY (EGD) WITH PROPOFOL;  Surgeon: Mauri Pole, MD;  Location: [facility];  Service: Endoscopy;  Laterality: N/A;  . JOINT REPLACEMENT    . KNEE SURGERY Left    HPI:  67 year old man presented on 10/3 with upper GI bleed; found to have Mallory-Weiss tear.  Developed AMS 10/10, suffered PEA arrrest and was intubated until 10/11.  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   Assessment / Plan / Recommendation Clinical Impression  Pt is confused, perseveratory and difficult to redirect; followed commands inconsistently.  Presents with a functional oropharyngeal swallow with adequate oral awareness and effort, the appearance of a brisk swallow response, no s/s of  aspiration.  There appear to be no effects of intubation upon the swallow; recommend resuming heart healthycarb modified diet, thin liquids. Recommend full supervision during meals given significant confusion.  No further SLP f/u is needed.    SLP Visit Diagnosis: Dysphagia, unspecified (R13.10)    Aspiration Risk  No limitations    Diet Recommendation     Medication Administration: Whole meds with liquid    Other  Recommendations Oral Care Recommendations: Oral care BID   Follow up Recommendations        Frequency and Duration            Prognosis        Swallow Study   General HPI: 67 year old man presented on 10/3 with upper GI bleed; found to have Mallory-Weiss tear.  Developed AMS 10/10, suffered PEA arrrest and was intubated until 10/11.  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  Type of Study: Bedside Swallow Evaluation Previous Swallow Assessment: no Diet Prior to this Study: NPO Temperature Spikes Noted: No Respiratory Status: Nasal cannula History of Recent Intubation: Yes Length of Intubations (days): 1 days Date extubated: 01/23/18 Behavior/Cognition: Alert;Confused;Distractible Oral Cavity Assessment: Within Functional Limits Oral Care Completed by SLP: No Oral Cavity - Dentition: Missing dentition Vision: Functional for self-feeding Self-Feeding Abilities: Needs assist Patient Positioning: Upright in bed Baseline Vocal Quality: Normal Volitional Cough: Cognitively unable to elicit Volitional Swallow: Unable to elicit    Oral/Motor/Sensory Function Overall Oral Motor/Sensory Function:  Within functional limits   Ice Chips Ice chips: Within functional limits    Thin Liquid Thin Liquid: Within functional limits    Nectar Thick Nectar Thick Liquid: Not tested   Honey Thick Honey Thick Liquid: Not tested   Puree Puree: Within functional limits   Solid     Solid: (declined)      [name] 01/23/2018,3:48 PM  Estill Bamberg L. Tivis Ringer, [place] Office number [phone] Pager [phone]

## 2018-01-23 NOTE — Progress Notes (Signed)
PULMONARY / CRITICAL CARE MEDICINE   NAME:  Adam Savage, MRN:  [id number], DOB:  1951/03/21, LOS: 6 ADMISSION DATE:  01/15/2018, CONSULTATION DATE:  10/10 REFERRING MD:  , CHIEF COMPLAINT:  PEA arrest  BRIEF HISTORY:    67 year old with chronic liver disease usually on Coumadin for a history of aortic valve replacement admitted on 10/3 for hematemesis.  EGD showed a Mallory-Weiss tear which was not actively bleeding.  Subsequent PEA arrest on 10/10 with extreme anemia.  CT scan is shown a very large retroperitoneal hematoma HISTORY OF PRESENT ILLNESS        This is a 67 year old with a history of aortic valve replacement usually on Coumadin who presented with hematemesis on 10/3 upper endoscopy was performed and he was found to have a Mallory-Weiss tear.  It was not felt to be actively bleeding and was transitioned to heparin due to the presence of his aortic valve.  He has a history of a complex seizure disorder and prior temporal lobe resection for control of seizures.  By report of family his mental status has been declining and he was being evaluated for worsening mental status.  On the morning of 10/10 his hemoglobin was 6.7 and he was subsequently found unresponsive in PEA arrest.  He was resuscitated and transferred to the intensive care unit where he received 4 units of packed red blood cells and 4 units of FFP.  Despite volume resuscitation he continued to require high doses of levo fed for blood pressure support.  CT scan of the abdomen was obtained as there was no overt evidence of ongoing GI bleeding and he was found to have a large retroperitoneal hematoma.  He was given vitamin K in addition to the FFP for correction of his coagulopathy. SIGNIFICANT PAST MEDICAL HISTORY   Chronic liver disease, complex seizure disorder status post temporal lobectomy, AVR  SIGNIFICANT EVENTS:  PEA arrest on 10/10 STUDIES:    CULTURES:  Blood urine and sputum cultures were ordered on 10/11  ANTIBIOTICS:   Verda Cumins started on 10/11  LINES/TUBES:  Left subclavian introducer placed on 10/10, debated 10/10 CONSULTANTS:  GI, neurology SUBJECTIVE:  Intubated and mechanically ventilated and sedated on fentanyl this morning.  He is still requiring levo fed for blood pressure control despite extensive volume resuscitation.  He is 3.5 L positive overnight and has marginal urine output.  CONSTITUTIONAL: BP (!) 92/41 (BP Location: Right Arm)   Pulse 65   Temp 98.5 F (36.9 C) (Axillary)   Resp (!) 24   Ht 6' (1.829 m)   Wt 125.6 kg   SpO2 99%   BMI 37.57 kg/m   I/O last 3 completed shifts: In: 4312.3 [I.V.:2069.6; Blood:1992; Other:20; IV Piggyback:230.8] Out: 655 [Urine:655]     Vent Mode: PRVC FiO2 (%):  [50 %-100 %] 50 % Set Rate:  [15 bmp-24 bmp] 24 bmp Vt Set:  [500 mL-620 mL] 620 mL PEEP:  [5 cmH20] 5 cmH20 Plateau Pressure:  [17 cmH20-25 cmH20] 17 cmH20  PHYSICAL EXAM: General: Obese male who is orally intubated mechanically ventilated and in no obvious distress. Neuro: He is sedated on fentanyl and does not interact voice for me.  He does move all fours very vigorously to noxious stimuli.  Pupils are equal to face is symmetric, he has some asterixis. HEENT:  Cardiovascular: S1 and S2 are regular the valve sounds are very crisp and mechanical.  There is no murmur.  Blood pressure does not increase with straight leg raise. Lungs: He  is not breathing above the set ventilator rate, there is symmetric air movement some scattered rhonchi and no wheezes. Abdomen: There are no flank ecchymoses, I do not appreciate any abdominal masses tenderness guarding or rebound Musculoskeletal:   Skin:    RESOLVED PROBLEM LIST   ASSESSMENT AND PLAN   1.  That is post PEA arrest.  Appears  that the PEA arrest was on the basis of hypovolemia related to a very large retroperitoneal hemorrhage.  For the next 24 hours I am anticipating targeting normal coagulation parameters despite his AVR  monitoring urine output hemodynamics and hemoglobins as indicators of the adequacy of resuscitation.  He appears to be adequately fluid resuscitated at present and despite that fact he still requiring Levophed.  I have pancultured the patient and empirically switched him to Zosyn.  I have also requested a serum cortisol. 2.  Altered mental status with history of seizures.  Continues Keppra.  Thyroid functions and serum ammonia are pending, possibility of concurrent sepsis being addressed.   SUMMARY OF TODAY'S PLAN:    Best Practice / Goals of Care / Disposition.   DVT PROPHYLAXIS: Currently no pharmacologic prophylaxis due to active bleeding SUP: Protonix infusion NUTRITION: MOBILITY: GOALS OF CARE: FAMILY DISCUSSIONS:  DISPOSITION   LABS  Glucose Recent Labs  Lab 01/22/18 0733 01/22/18 1138 01/22/18 1801 01/22/18 1951 01/22/18 2321 01/23/18 0314  GLUCAP 202* 148* 238* 263* 276* 239*    BMET Recent Labs  Lab 01/22/18 0847 01/22/18 1246 01/22/18 1533 01/23/18 0310  NA 135 139 143 135  K 4.8 5.7* 4.1 4.4  CL 108 109  --  107  CO2 14* 13*  --  16*  BUN 54* 52*  --  70*  CREATININE 3.62* 3.95*  --  4.10*  GLUCOSE 251* 132* 141* 235*    Liver Enzymes Recent Labs  Lab 01/21/18 0655 01/22/18 1246 01/22/18 1301 01/23/18 0310  AST 76*  --  152*  --   ALT 49*  --  88*  --   ALKPHOS 104  --  109  --   BILITOT 1.4*  --  1.6*  --   ALBUMIN 2.3* 1.8* 1.8* 2.4*    Electrolytes Recent Labs  Lab 01/22/18 0847 01/22/18 1246 01/22/18 1301 01/23/18 0310  CALCIUM 7.9* 7.7*  --  7.5*  MG  --   --  2.5* 2.2  PHOS  --  8.5*  --  5.5*    CBC Recent Labs  Lab 01/22/18 1803 01/22/18 1951 01/23/18 0310  WBC 39.2* 41.0* 43.1*  HGB 12.2* 12.2* 12.6*  HCT 37.5* 37.3* 37.1*  PLT 169 170 186    ABG Recent Labs  Lab 01/22/18 1502 01/22/18 1935 01/23/18 0310  PHART 7.293* 7.428 7.409  PCO2ART 32.9 23.7* 28.8*  PO2ART 324.0* 113* 93.4    Coag's Recent Labs   Lab 01/22/18 0710 01/22/18 1301 01/22/18 1951  INR 1.97 2.76 1.71    Sepsis Markers Recent Labs  Lab 01/22/18 1301 01/22/18 1339 01/22/18 1804 01/23/18 0310  LATICACIDVEN 11.4*  --  3.9*  --   PROCALCITON  --  0.50  --  1.98    Cardiac Enzymes Recent Labs  Lab 01/22/18 1246 01/23/18 0310  TROPONINI 0.03* 0.04*    PAST MEDICAL HISTORY :   He  has a past medical history of COPD (chronic obstructive pulmonary disease) ([place]), Diabetes mellitus without complication ([place]), Mechanical heart valve present, Pre-diabetes, and Seizures ([place]).  PAST SURGICAL HISTORY:  He  has a past  surgical history that includes Cardiac surgery; Knee surgery (Left); Joint replacement; Colon surgery; CORONARY/GRAFT ANGIOGRAPHY (N/A, 10/31/2017); Cholecystectomy; and Esophagogastroduodenoscopy (egd) with propofol (N/A, 01/18/2018).  Allergies  Allergen Reactions  . Morphine And Related Other (See Comments)    Combative     No current facility-administered medications on file prior to encounter.    Current Outpatient Medications on File Prior to Encounter  Medication Sig  . acetaminophen (TYLENOL) 650 MG CR tablet Take 1,300 mg by mouth every 8 (eight) hours as needed for pain.  Marland Kitchen albuterol (PROVENTIL HFA;VENTOLIN HFA) 108 (90 Base) MCG/ACT inhaler Inhale 2 puffs into the lungs every 6 (six) hours as needed for wheezing or shortness of breath.  Marland Kitchen aspirin EC 81 MG tablet Take 81 mg by mouth daily.  Marland Kitchen atorvastatin (LIPITOR) 10 MG tablet Take 1 tablet (10 mg total) by mouth daily.  . carvedilol (COREG) 6.25 MG tablet Take 1 tablet (6.25 mg total) by mouth 2 (two) times daily with a meal.  . clotrimazole (LOTRIMIN) 1 % cream Apply 1 application topically 2 (two) times daily. For 2 weeks  . furosemide (LASIX) 20 MG tablet Take 3 tablets (60 mg total) by mouth daily.  . hydrocortisone (ANUSOL-HC) 25 MG suppository Place 1 suppository (25 mg total) rectally 2 (two) times daily as needed for hemorrhoids  or anal itching.  . insulin detemir (LEVEMIR) 100 UNIT/ML injection Inject 12 Units into the skin daily.  Marland Kitchen levETIRAcetam (KEPPRA) 1000 MG tablet Take 1 tablet (1,000 mg total) by mouth 2 (two) times daily.  Marland Kitchen lisinopril (PRINIVIL,ZESTRIL) 10 MG tablet Take 10 mg by mouth daily.  . mirtazapine (REMERON) 30 MG tablet Take 30 mg by mouth at bedtime.  . ramipril (ALTACE) 5 MG capsule Take 1 capsule (5 mg total) by mouth daily.  . ranitidine (ZANTAC) 150 MG tablet Take 150 mg by mouth 2 (two) times daily.   . tamsulosin (FLOMAX) 0.4 MG CAPS capsule Take 0.4 mg by mouth at bedtime.   . traZODone (DESYREL) 100 MG tablet Take 200 mg by mouth at bedtime.  Marland Kitchen warfarin (COUMADIN) 5 MG tablet Take 5 mg by mouth See admin instructions. Take one tablet (5 mg) by mouth Monday, Tuesday, Wednesday, Friday, Saturday nights - 9pm  . warfarin (COUMADIN) 7.5 MG tablet Take 7.5 mg by mouth See admin instructions. Take one tablet (7.5 mg) by mouth on Sunday and Thursday nights - 9pm (take 5 mg tablet on all other days of the week)  . blood glucose meter kit and supplies Dispense based on patient and insurance preference. Use up to four times daily as directed. (FOR ICD-10 E10.9, E11.9).  Marland Kitchen glucose blood test strip Use as instructed  . Lancets MISC 1 Units by Does not apply route 4 (four) times daily -  with meals and at bedtime.    FAMILY HISTORY:   His family history includes Hypertension in his father.  SOCIAL HISTORY:  He  reports that he has been smoking cigarettes. He has been smoking about 1.00 pack per day. He has never used smokeless tobacco. He reports that he drank alcohol. He reports that he does not use drugs.  REVIEW OF SYSTEMS:      Other than 35 minutes was spent in the care of this hemodynamically unstable patient today  Lars Masson, MD

## 2018-01-23 NOTE — Progress Notes (Signed)
Telesitter order placed for safety/redirection

## 2018-01-23 NOTE — Progress Notes (Signed)
Soft restraints removed  

## 2018-01-23 NOTE — Progress Notes (Signed)
PT Cancellation Note/ discharge  Patient Details Name: Adam Savage MRN: [id number] DOB: [birth year]   Cancelled Treatment:    Reason Eval/Treat Not Completed: Patient not medically ready(pt with PEA arrest on vent. will sign off and await new order given change in pt status)   [name] 01/23/2018, 7:07 AM  Elwyn Reach, PT Acute Rehabilitation Services Pager: [phone] Office: [phone]

## 2018-01-23 NOTE — Progress Notes (Signed)
Pharmacy Antibiotic Note  Adam Savage is a 67 y.o. male admitted on 01/15/2018 with sepsis.  Pharmacy has been consulted for Zosyn dosing. WBC is increasing. Tmax 100.3. Renal function worsening.   Plan: Zosyn 3.375G IV q8h to be infused over 4 hours Trend WBC, temp, renal function  F/U infectious work-up  Height: 6' (182.9 cm) Weight: 277 lb (125.6 kg) IBW/kg (Calculated) : 77.6  Temp (24hrs), Avg:97.7 F (36.5 C), Min:96 F (35.6 C), Max:100.3 F (37.9 C)  Recent Labs  Lab 01/21/18 0655 01/22/18 0710 01/22/18 0847 01/22/18 1246 01/22/18 1301 01/22/18 1803 01/22/18 1804 01/22/18 1951 01/23/18 0310  WBC  --  19.5*  --   --  35.8* 39.2*  --  41.0* 43.1*  CREATININE 1.63* 3.33* 3.62* 3.95*  --   --   --   --  4.10*  LATICACIDVEN  --   --   --   --  11.4*  --  3.9*  --   --     Estimated Creatinine Clearance: 23.9 mL/min (A) (by C-G formula based on SCr of 4.1 mg/dL (H)).    Allergies  Allergen Reactions  . Morphine And Related Other (See Comments)    Combative      [name] 01/23/2018 7:08 AM

## 2018-01-23 NOTE — Progress Notes (Signed)
IV team at bedside to assess right arm. Per RN from prior shift, supposed infiltration of vasoactive yesterday. + edema, ecchymosis, + pulses, no loss of sensation per pt report. Vasoactives moved last night to central access. No infusions running into right arm. Pt denies pain. Continue to monitor.

## 2018-01-23 NOTE — Progress Notes (Addendum)
Tolerating ps 5/5 with good tv. Following commands. Order obtained for extubation  Extubated @ 1007. [place] @ 2L, sp02 94%, no distress noted. following commands.

## 2018-01-23 NOTE — Progress Notes (Signed)
Switched to ps 10/5 @ 0825. RR currently 14

## 2018-01-23 NOTE — Progress Notes (Signed)
BSL dementia, stml. Tele sitter order, family @ bedside. Follows commands, easily redirected. Tolerating 2L cannula. LS cl, dim bases. NSR, no further SVT since this morning. A line dc'd, MAP >60. Low dose levophed. No s/sx bleeding this shift, hgb stable. Passed swallow, tol diet. Minimal stool, +200 UOP/shift.

## 2018-01-23 NOTE — Procedures (Signed)
Extubation Procedure Note  Patient Details:   Name: Adam Savage DOB: 1950-09-09 MRN: [id number]   Airway Documentation:    Vent end date: 01/23/18 Vent end time: 1008   Evaluation  O2 sats: stable throughout Complications: No apparent complications Patient did tolerate procedure well. Bilateral Breath Sounds: Diminished   Yes  Patient was extubated to 2 LPM nasal cannula per MD order. Patient had positive cuff leak, strong productive cough, and able to speak name. No stridor noted. BBS were slight Rhonchi but patient is attempting to cough up sputum. RT will continue to monitor patient.   Adam Savage 01/23/2018, 10:09 AM

## 2018-01-24 DIAGNOSIS — K226 Gastro-esophageal laceration-hemorrhage syndrome: Principal | ICD-10-CM

## 2018-01-24 LAB — RENAL FUNCTION PANEL
Albumin: 2.3 g/dL — ABNORMAL LOW (ref 3.5–5.0)
Anion gap: 9 (ref 5–15)
BUN: 74 mg/dL — ABNORMAL HIGH (ref 8–23)
CO2: 19 mmol/L — ABNORMAL LOW (ref 22–32)
Calcium: 7.7 mg/dL — ABNORMAL LOW (ref 8.9–10.3)
Chloride: 106 mmol/L (ref 98–111)
Creatinine, Ser: 3.54 mg/dL — ABNORMAL HIGH (ref 0.61–1.24)
GFR calc Af Amer: 19 mL/min — ABNORMAL LOW (ref >60)
GFR calc non Af Amer: 16 mL/min — ABNORMAL LOW (ref >60)
Glucose, Bld: 128 mg/dL — ABNORMAL HIGH (ref 70–99)
Phosphorus: 4.7 mg/dL — ABNORMAL HIGH (ref 2.5–4.6)
Potassium: 3.8 mmol/L (ref 3.5–5.1)
Sodium: 134 mmol/L — ABNORMAL LOW (ref 135–145)

## 2018-01-24 LAB — CBC WITH DIFFERENTIAL/PLATELET
Abs Immature Granulocytes: 0.16 10*3/uL — ABNORMAL HIGH (ref 0.00–0.07)
Basophils Absolute: 0 10*3/uL (ref 0.0–0.1)
Basophils Relative: 0 %
Eosinophils Absolute: 0.1 10*3/uL (ref 0.0–0.5)
Eosinophils Relative: 1 %
HCT: 30.7 % — ABNORMAL LOW (ref 39.0–52.0)
Hemoglobin: 10.2 g/dL — ABNORMAL LOW (ref 13.0–17.0)
Immature Granulocytes: 1 %
Lymphocytes Relative: 10 %
Lymphs Abs: 1.7 10*3/uL (ref 0.7–4.0)
MCH: 27.5 pg (ref 26.0–34.0)
MCHC: 33.2 g/dL (ref 30.0–36.0)
MCV: 82.7 fL (ref 80.0–100.0)
Monocytes Absolute: 2.4 10*3/uL — ABNORMAL HIGH (ref 0.1–1.0)
Monocytes Relative: 14 %
Neutro Abs: 13.2 10*3/uL — ABNORMAL HIGH (ref 1.7–7.7)
Neutrophils Relative %: 74 %
Platelets: 103 10*3/uL — ABNORMAL LOW (ref 150–400)
RBC: 3.71 MIL/uL — ABNORMAL LOW (ref 4.22–5.81)
RDW: 16.8 % — ABNORMAL HIGH (ref 11.5–15.5)
WBC: 17.5 10*3/uL — ABNORMAL HIGH (ref 4.0–10.5)
nRBC: 0.1 % (ref 0.0–0.2)

## 2018-01-24 LAB — GLUCOSE, CAPILLARY
Glucose-Capillary: 123 mg/dL — ABNORMAL HIGH (ref 70–99)
Glucose-Capillary: 135 mg/dL — ABNORMAL HIGH (ref 70–99)
Glucose-Capillary: 152 mg/dL — ABNORMAL HIGH (ref 70–99)
Glucose-Capillary: 230 mg/dL — ABNORMAL HIGH (ref 70–99)

## 2018-01-24 LAB — PROCALCITONIN: Procalcitonin: 0.86 ng/mL

## 2018-01-24 LAB — URINE CULTURE
Culture: NO GROWTH
Special Requests: NORMAL

## 2018-01-24 MED ORDER — LACTULOSE 10 GM/15ML PO SOLN
20.0000 g | Freq: Every day | ORAL | Status: DC
  Administered 2018-01-24 – 2018-01-30 (×6): 20 g via ORAL
  Filled 2018-01-24 (×7): qty 30

## 2018-01-24 MED ORDER — ZOLPIDEM TARTRATE 5 MG PO TABS
5.0000 mg | ORAL_TABLET | Freq: Every evening | ORAL | Status: DC | PRN
  Administered 2018-01-24: 5 mg via ORAL
  Filled 2018-01-24: qty 1

## 2018-01-24 MED ORDER — PANTOPRAZOLE SODIUM 20 MG PO TBEC
20.0000 mg | DELAYED_RELEASE_TABLET | Freq: Two times a day (BID) | ORAL | Status: DC
  Administered 2018-01-24 – 2018-01-29 (×12): 20 mg via ORAL
  Filled 2018-01-24 (×13): qty 1

## 2018-01-24 NOTE — Progress Notes (Signed)
After changing pt's sheets & cleaning him, pt was sitting up in bed & HR shot up from 80's NSR to 138 ST. Pt was complaining of 2/10 pain & otherwise asymptomatic. Leads were changed & pt was given pain meds. HR stabilized back to 80's NSR. Pt asleep. Will continue to monitor.

## 2018-01-24 NOTE — Telephone Encounter (Signed)
I was unable to reach patient due to hospital admission. Signing off of case.

## 2018-01-24 NOTE — Progress Notes (Signed)
PULMONARY / CRITICAL CARE MEDICINE   NAME:  Adam Savage, MRN:  [id number], DOB:  06/20/1950, LOS: 7 ADMISSION DATE:  01/15/2018, CONSULTATION DATE:  10/10 REFERRING MD:  , CHIEF COMPLAINT:  PEA arrest  BRIEF HISTORY:    67 year old with chronic liver disease usually on Coumadin for a history of aortic valve replacement admitted on 10/3 for hematemesis.  EGD showed a Mallory-Weiss tear which was not actively bleeding.  Subsequent PEA arrest on 10/10 with extreme anemia.  CT scan showed a very large retroperitoneal hematoma HISTORY OF PRESENT ILLNESS        This is a 67 year old with a history of aortic valve replacement usually on Coumadin who presented with hematemesis on 10/3 upper endoscopy was performed and he was found to have a Mallory-Weiss tear.  It was not felt to be actively bleeding and was transitioned to heparin due to the presence of his aortic valve.  He has a history of a complex seizure disorder and prior temporal lobe resection for control of seizures.  By report of family his mental status has been declining and he was being evaluated for worsening mental status.  On the morning of 10/10 his hemoglobin was 6.7 and he was subsequently found unresponsive in PEA arrest.  He was resuscitated and transferred to the intensive care unit where he received 4 units of packed red blood cells and 4 units of FFP.  Despite volume resuscitation he continued to require high doses of levo fed for blood pressure support.  CT scan of the abdomen was obtained as there was no overt evidence of ongoing GI bleeding and he was found to have a large retroperitoneal hematoma.  He was given vitamin K in addition to the FFP for correction of his coagulopathy. SIGNIFICANT PAST MEDICAL HISTORY   Chronic liver disease, complex seizure disorder status post temporal lobectomy, AVR  SIGNIFICANT EVENTS:  PEA arrest on 10/10 STUDIES:    CULTURES:  Blood urine and sputum cultures were ordered on 10/11  ANTIBIOTICS:   Verda Cumins started on 10/11  LINES/TUBES:  Left subclavian introducer placed on 10/10, debated 10/10 CONSULTANTS:  GI, neurology SUBJECTIVE:  He was successfully extubated yesterday.  He is conversant with me this morning complaining primarily of sternal pain.  He has some ecchymoses at the line site where he had previously received peripheral levo fed but he is not complaining of pain at that location.  He is not having any abdominal pain.  He continues to be on a small dose of levo fed.  Urine output has improved substantially overnight  CONSTITUTIONAL: BP (!) 115/43   Pulse 79   Temp 98.6 F (37 C) (Axillary)   Resp (!) 22   Ht 6' (1.829 m)   Wt 125.6 kg   SpO2 100%   BMI 37.57 kg/m   I/O last 3 completed shifts: In: 3151.5 [P.O.:276; I.V.:2753.7; Other:20; IV Piggyback:101.8] Out: 1310 [Urine:1230; Stool:80]     Vent Mode: PSV;CPAP FiO2 (%):  [40 %] 40 % PEEP:  [5 cmH20] 5 cmH20 Pressure Support:  [5 cmH20] 5 cmH20  PHYSICAL EXAM: General: This is an obese male who is extubated and conversant.  He is in no overt distress  Neuro: He is oriented to person and place but cannot tell me the day or date.  Pupils are equal and he moves all fours. HEENT:  Cardiovascular: S1 and S2 are regular and his bowel sounds are crisp and mechanical.  There is no murmur.  He has some tenderness  on palpation of the sternum but there is no instability or deformity. Lungs: Respirations are unlabored, there are no wheezes there are a few scattered rhonchi Abdomen: The continue to be no flank ecchymoses.  I do not appreciate any abdominal masses tenderness guarding or rebound  Musculoskeletal: Some ecchymoses in the right antecubital fossa and a cord.  I do not appreciate any fluctuance or necrosis. Skin:    RESOLVED PROBLEM LIST   ASSESSMENT AND PLAN   1.   PEA arrest.  Appears  that the PEA arrest was on the basis of hypovolemia related to a very large retroperitoneal hemorrhage.  I will  not be resuming anticoagulation for his aortic valve today.  He does have some sternal pain likely related to CPR and appropriate analgesics are in place.  Renal function is recovering following the arrest and I do not anticipate any other interventions other than to keep him adequately hydrated 2.  Altered mental status with history of seizures.  Continues Keppra.  Thyroid functions are normal and serum ammonia is marginally elevated.  We will start him on a dose of lactulose.  In addition he has an elevated white count and procalcitonin suggesting systemic infection.  I am most suspicious of aspiration and continuing him on Zosyn.     SUMMARY OF TODAY'S PLAN:    Best Practice / Goals of Care / Disposition.   DVT PROPHYLAXIS: Currently no pharmacologic prophylaxis due to active bleeding SUP: Protonix infusion NUTRITION: MOBILITY: GOALS OF CARE: FAMILY DISCUSSIONS:  DISPOSITION   LABS  Glucose Recent Labs  Lab 01/23/18 0805 01/23/18 1200 01/23/18 1612 01/23/18 2002 01/23/18 2349 01/24/18 0434  GLUCAP 189* 182* 186* 188* 230* 123*    BMET Recent Labs  Lab 01/22/18 1246 01/22/18 1533 01/23/18 0310 01/24/18 0500  NA 139 143 135 134*  K 5.7* 4.1 4.4 3.8  CL 109  --  107 106  CO2 13*  --  16* 19*  BUN 52*  --  70* 74*  CREATININE 3.95*  --  4.10* 3.54*  GLUCOSE 132* 141* 235* 128*    Liver Enzymes Recent Labs  Lab 01/21/18 0655  01/22/18 1301 01/23/18 0310 01/24/18 0500  AST 76*  --  152*  --   --   ALT 49*  --  88*  --   --   ALKPHOS 104  --  109  --   --   BILITOT 1.4*  --  1.6*  --   --   ALBUMIN 2.3*   < > 1.8* 2.4* 2.3*   < > = values in this interval not displayed.    Electrolytes Recent Labs  Lab 01/22/18 1246 01/22/18 1301 01/23/18 0310 01/24/18 0500  CALCIUM 7.7*  --  7.5* 7.7*  MG  --  2.5* 2.2  --   PHOS 8.5*  --  5.5* 4.7*    CBC Recent Labs  Lab 01/23/18 0310 01/23/18 1404 01/23/18 1836  WBC 43.1* 34.6* 28.0*  HGB 12.6* 11.2*  10.8*  HCT 37.1* 33.1* 32.1*  PLT 186 146* 79*    ABG Recent Labs  Lab 01/22/18 1502 01/22/18 1935 01/23/18 0310  PHART 7.293* 7.428 7.409  PCO2ART 32.9 23.7* 28.8*  PO2ART 324.0* 113* 93.4    Coag's Recent Labs  Lab 01/22/18 1301 01/22/18 1951 01/23/18 1404  INR 2.76 1.71 1.60    Sepsis Markers Recent Labs  Lab 01/22/18 1301 01/22/18 1339 01/22/18 1804 01/23/18 0310 01/24/18 0500  LATICACIDVEN 11.4*  --  3.9*  --   --  PROCALCITON  --  0.50  --  1.98 0.86    Cardiac Enzymes Recent Labs  Lab 01/22/18 1246 01/23/18 0310  TROPONINI 0.03* 0.04*    PAST MEDICAL HISTORY :   He  has a past medical history of COPD (chronic obstructive pulmonary disease) ([place]), Diabetes mellitus without complication ([place]), Mechanical heart valve present, Pre-diabetes, and Seizures ([place]).  PAST SURGICAL HISTORY:  He  has a past surgical history that includes Cardiac surgery; Knee surgery (Left); Joint replacement; Colon surgery; CORONARY/GRAFT ANGIOGRAPHY (N/A, 10/31/2017); Cholecystectomy; and Esophagogastroduodenoscopy (egd) with propofol (N/A, 01/18/2018).  Allergies  Allergen Reactions  . Morphine And Related Other (See Comments)    Combative     No current facility-administered medications on file prior to encounter.    Current Outpatient Medications on File Prior to Encounter  Medication Sig  . acetaminophen (TYLENOL) 650 MG CR tablet Take 1,300 mg by mouth every 8 (eight) hours as needed for pain.  Marland Kitchen albuterol (PROVENTIL HFA;VENTOLIN HFA) 108 (90 Base) MCG/ACT inhaler Inhale 2 puffs into the lungs every 6 (six) hours as needed for wheezing or shortness of breath.  Marland Kitchen aspirin EC 81 MG tablet Take 81 mg by mouth daily.  Marland Kitchen atorvastatin (LIPITOR) 10 MG tablet Take 1 tablet (10 mg total) by mouth daily.  . carvedilol (COREG) 6.25 MG tablet Take 1 tablet (6.25 mg total) by mouth 2 (two) times daily with a meal.  . clotrimazole (LOTRIMIN) 1 % cream Apply 1 application topically  2 (two) times daily. For 2 weeks  . furosemide (LASIX) 20 MG tablet Take 3 tablets (60 mg total) by mouth daily.  . hydrocortisone (ANUSOL-HC) 25 MG suppository Place 1 suppository (25 mg total) rectally 2 (two) times daily as needed for hemorrhoids or anal itching.  . insulin detemir (LEVEMIR) 100 UNIT/ML injection Inject 12 Units into the skin daily.  Marland Kitchen levETIRAcetam (KEPPRA) 1000 MG tablet Take 1 tablet (1,000 mg total) by mouth 2 (two) times daily.  Marland Kitchen lisinopril (PRINIVIL,ZESTRIL) 10 MG tablet Take 10 mg by mouth daily.  . mirtazapine (REMERON) 30 MG tablet Take 30 mg by mouth at bedtime.  . ramipril (ALTACE) 5 MG capsule Take 1 capsule (5 mg total) by mouth daily.  . ranitidine (ZANTAC) 150 MG tablet Take 150 mg by mouth 2 (two) times daily.   . tamsulosin (FLOMAX) 0.4 MG CAPS capsule Take 0.4 mg by mouth at bedtime.   . traZODone (DESYREL) 100 MG tablet Take 200 mg by mouth at bedtime.  Marland Kitchen warfarin (COUMADIN) 5 MG tablet Take 5 mg by mouth See admin instructions. Take one tablet (5 mg) by mouth Monday, Tuesday, Wednesday, Friday, Saturday nights - 9pm  . warfarin (COUMADIN) 7.5 MG tablet Take 7.5 mg by mouth See admin instructions. Take one tablet (7.5 mg) by mouth on Sunday and Thursday nights - 9pm (take 5 mg tablet on all other days of the week)  . blood glucose meter kit and supplies Dispense based on patient and insurance preference. Use up to four times daily as directed. (FOR ICD-10 E10.9, E11.9).  Marland Kitchen glucose blood test strip Use as instructed  . Lancets MISC 1 Units by Does not apply route 4 (four) times daily -  with meals and at bedtime.    FAMILY HISTORY:   His family history includes Hypertension in his father.  SOCIAL HISTORY:  He  reports that he has been smoking cigarettes. He has been smoking about 1.00 pack per day. He has never used smokeless tobacco. He reports  that he drank alcohol. He reports that he does not use drugs.  REVIEW OF SYSTEMS:      Lars Masson, MD

## 2018-01-25 ENCOUNTER — Inpatient Hospital Stay (HOSPITAL_COMMUNITY): Payer: Medicare Other

## 2018-01-25 ENCOUNTER — Other Ambulatory Visit: Payer: Self-pay

## 2018-01-25 LAB — RENAL FUNCTION PANEL
Albumin: 2.1 g/dL — ABNORMAL LOW (ref 3.5–5.0)
Anion gap: 8 (ref 5–15)
BUN: 61 mg/dL — ABNORMAL HIGH (ref 8–23)
CO2: 22 mmol/L (ref 22–32)
Calcium: 8 mg/dL — ABNORMAL LOW (ref 8.9–10.3)
Chloride: 106 mmol/L (ref 98–111)
Creatinine, Ser: 2.39 mg/dL — ABNORMAL HIGH (ref 0.61–1.24)
GFR calc Af Amer: 31 mL/min — ABNORMAL LOW (ref >60)
GFR calc non Af Amer: 26 mL/min — ABNORMAL LOW (ref >60)
Glucose, Bld: 128 mg/dL — ABNORMAL HIGH (ref 70–99)
Phosphorus: 3.8 mg/dL (ref 2.5–4.6)
Potassium: 3.8 mmol/L (ref 3.5–5.1)
Sodium: 136 mmol/L (ref 135–145)

## 2018-01-25 LAB — BASIC METABOLIC PANEL
Anion gap: 8 (ref 5–15)
BUN: 60 mg/dL — ABNORMAL HIGH (ref 8–23)
CO2: 22 mmol/L (ref 22–32)
Calcium: 7.9 mg/dL — ABNORMAL LOW (ref 8.9–10.3)
Chloride: 107 mmol/L (ref 98–111)
Creatinine, Ser: 2.4 mg/dL — ABNORMAL HIGH (ref 0.61–1.24)
GFR calc Af Amer: 31 mL/min — ABNORMAL LOW (ref >60)
GFR calc non Af Amer: 26 mL/min — ABNORMAL LOW (ref >60)
Glucose, Bld: 132 mg/dL — ABNORMAL HIGH (ref 70–99)
Potassium: 3.8 mmol/L (ref 3.5–5.1)
Sodium: 137 mmol/L (ref 135–145)

## 2018-01-25 LAB — GLUCOSE, CAPILLARY
Glucose-Capillary: 111 mg/dL — ABNORMAL HIGH (ref 70–99)
Glucose-Capillary: 123 mg/dL — ABNORMAL HIGH (ref 70–99)
Glucose-Capillary: 137 mg/dL — ABNORMAL HIGH (ref 70–99)
Glucose-Capillary: 139 mg/dL — ABNORMAL HIGH (ref 70–99)
Glucose-Capillary: 143 mg/dL — ABNORMAL HIGH (ref 70–99)
Glucose-Capillary: 150 mg/dL — ABNORMAL HIGH (ref 70–99)
Glucose-Capillary: 184 mg/dL — ABNORMAL HIGH (ref 70–99)

## 2018-01-25 LAB — CBC WITH DIFFERENTIAL/PLATELET
Abs Immature Granulocytes: 0.09 10*3/uL — ABNORMAL HIGH (ref 0.00–0.07)
Basophils Absolute: 0 10*3/uL (ref 0.0–0.1)
Basophils Relative: 0 %
Eosinophils Absolute: 0.1 10*3/uL (ref 0.0–0.5)
Eosinophils Relative: 1 %
HCT: 31.8 % — ABNORMAL LOW (ref 39.0–52.0)
Hemoglobin: 10.2 g/dL — ABNORMAL LOW (ref 13.0–17.0)
Immature Granulocytes: 1 %
Lymphocytes Relative: 11 %
Lymphs Abs: 1.4 10*3/uL (ref 0.7–4.0)
MCH: 27.1 pg (ref 26.0–34.0)
MCHC: 32.1 g/dL (ref 30.0–36.0)
MCV: 84.4 fL (ref 80.0–100.0)
Monocytes Absolute: 1.7 10*3/uL — ABNORMAL HIGH (ref 0.1–1.0)
Monocytes Relative: 13 %
Neutro Abs: 9.9 10*3/uL — ABNORMAL HIGH (ref 1.7–7.7)
Neutrophils Relative %: 74 %
Platelets: 92 10*3/uL — ABNORMAL LOW (ref 150–400)
RBC: 3.77 MIL/uL — ABNORMAL LOW (ref 4.22–5.81)
RDW: 16.7 % — ABNORMAL HIGH (ref 11.5–15.5)
WBC: 13.2 10*3/uL — ABNORMAL HIGH (ref 4.0–10.5)
nRBC: 0 % (ref 0.0–0.2)

## 2018-01-25 LAB — PROTIME-INR
INR: 1.36
Prothrombin Time: 16.6 seconds — ABNORMAL HIGH (ref 11.4–15.2)

## 2018-01-25 LAB — CULTURE, RESPIRATORY W GRAM STAIN
Culture: NORMAL
Special Requests: NORMAL

## 2018-01-25 LAB — MAGNESIUM: Magnesium: 2.3 mg/dL (ref 1.7–2.4)

## 2018-01-25 MED ORDER — METOPROLOL TARTRATE 5 MG/5ML IV SOLN
5.0000 mg | Freq: Once | INTRAVENOUS | Status: AC
  Administered 2018-01-25: 5 mg via INTRAVENOUS
  Filled 2018-01-25: qty 5

## 2018-01-25 MED ORDER — LEVETIRACETAM 250 MG PO TABS
1000.0000 mg | ORAL_TABLET | Freq: Two times a day (BID) | ORAL | Status: DC
  Filled 2018-01-25: qty 4

## 2018-01-25 MED ORDER — QUETIAPINE FUMARATE 25 MG PO TABS
25.0000 mg | ORAL_TABLET | Freq: Two times a day (BID) | ORAL | Status: DC
  Administered 2018-01-25 – 2018-01-30 (×12): 25 mg via ORAL
  Filled 2018-01-25 (×12): qty 1

## 2018-01-25 MED ORDER — WARFARIN SODIUM 5 MG PO TABS
5.0000 mg | ORAL_TABLET | Freq: Every morning | ORAL | Status: DC
  Administered 2018-01-25 – 2018-01-27 (×3): 5 mg via ORAL
  Filled 2018-01-25 (×3): qty 1

## 2018-01-25 MED ORDER — WARFARIN SODIUM 2.5 MG PO TABS: 2.5000 mg | ORAL_TABLET | Freq: Every morning | ORAL | Status: DC

## 2018-01-25 MED ORDER — NOREPINEPHRINE 4 MG/250ML-% IV SOLN
0.0000 ug/min | INTRAVENOUS | Status: DC
  Administered 2018-01-25: 4 ug/min via INTRAVENOUS
  Administered 2018-01-26: 2 ug/min via INTRAVENOUS
  Filled 2018-01-25 (×2): qty 250

## 2018-01-25 MED ORDER — GERHARDT'S BUTT CREAM
TOPICAL_CREAM | Freq: Three times a day (TID) | CUTANEOUS | Status: DC
  Administered 2018-01-25 – 2018-01-26 (×5): via TOPICAL
  Administered 2018-01-27: 1 via TOPICAL
  Administered 2018-01-27: 14:00:00 via TOPICAL
  Administered 2018-01-28: 1 via TOPICAL
  Administered 2018-01-28 – 2018-01-30 (×7): via TOPICAL
  Filled 2018-01-25: qty 1

## 2018-01-25 MED ORDER — HEPARIN SODIUM (PORCINE) 5000 UNIT/ML IJ SOLN
5000.0000 [IU] | Freq: Two times a day (BID) | INTRAMUSCULAR | Status: DC
  Administered 2018-01-25 – 2018-01-26 (×3): 5000 [IU] via SUBCUTANEOUS
  Filled 2018-01-25 (×3): qty 1

## 2018-01-25 MED ORDER — LEVETIRACETAM IN NACL 1000 MG/100ML IV SOLN
1000.0000 mg | Freq: Two times a day (BID) | INTRAVENOUS | Status: DC
  Administered 2018-01-25: 1000 mg via INTRAVENOUS
  Filled 2018-01-25 (×2): qty 100

## 2018-01-25 MED ORDER — DEXMEDETOMIDINE HCL IN NACL 400 MCG/100ML IV SOLN
0.4000 ug/kg/h | INTRAVENOUS | Status: DC
  Administered 2018-01-25: 0.4 ug/kg/h via INTRAVENOUS
  Administered 2018-01-25: 0.2 ug/kg/h via INTRAVENOUS
  Filled 2018-01-25: qty 100

## 2018-01-25 MED ORDER — CARVEDILOL 6.25 MG PO TABS
6.2500 mg | ORAL_TABLET | Freq: Two times a day (BID) | ORAL | Status: DC
  Administered 2018-01-25 – 2018-01-27 (×3): 6.25 mg via ORAL
  Filled 2018-01-25 (×3): qty 1

## 2018-01-25 MED ORDER — WARFARIN - PHYSICIAN DOSING INPATIENT
Freq: Every day | Status: DC
  Administered 2018-01-25: 10:00:00

## 2018-01-25 MED ORDER — LEVETIRACETAM 100 MG/ML PO SOLN
1000.0000 mg | Freq: Two times a day (BID) | ORAL | Status: DC
  Administered 2018-01-26 – 2018-01-30 (×11): 1000 mg via ORAL
  Filled 2018-01-25 (×11): qty 10

## 2018-01-25 NOTE — Progress Notes (Signed)
Notified Pharmacy regarding Keppra order and order adjustments made to route. Patient noted with poor vasculature and limited IV access at this time. Peripheral IV assessed earlier this shift and noted to be leaking. Peripheral  IV removed leaving left subclavian introducer for IV access. Currently Levo and Zosyn infusion via introducer and Keppra compatibility has not been tested with either drugs. Pharmacy advised order could be adjusted back to PO with dose due now.

## 2018-01-25 NOTE — Progress Notes (Signed)
Pt woke up from sleep and went into ST in the 140-150's. Pain medicine was given w/ no change in HR. EKG was performed and pt converted into AfibRVR. Louretta Parma, NP was on the unit at the time and stopped by. NP ordered 5mg  metoprolol IV STAT. Pt's rate dropped to 110-120's. NP gave verbal orders to watch HR & notify if >130; will initiate Cardizem gtt if so. Restarting pt's home Coreg dose in the AM. Will continue to monitor.

## 2018-01-25 NOTE — Progress Notes (Signed)
PULMONARY / CRITICAL CARE MEDICINE   NAME:  Xavyer Steenson, MRN:  [id number], DOB:  [birth year], LOS: 63 ADMISSION DATE:  01/15/2018, CONSULTATION DATE:  10/10 REFERRING MD:  , CHIEF COMPLAINT:  PEA arrest  BRIEF HISTORY:    67 year old with chronic liver disease usually on Coumadin for a history of aortic valve replacement admitted on 10/3 for hematemesis.  EGD showed a Mallory-Weiss tear which was not actively bleeding.  Subsequent PEA arrest on 10/10 with extreme anemia.  CT scan showed a very large retroperitoneal hematoma HISTORY OF PRESENT ILLNESS        This is a 67 year old with a history of aortic valve replacement usually on Coumadin who presented with hematemesis on 10/3 upper endoscopy was performed and he was found to have a Mallory-Weiss tear.  It was not felt to be actively bleeding and was transitioned to heparin due to the presence of his aortic valve.  He has a history of a complex seizure disorder and prior temporal lobe resection for control of seizures.  By report of family his mental status has been declining and he was being evaluated for worsening mental status.  On the morning of 10/10 his hemoglobin was 6.7 and he was subsequently found unresponsive in PEA arrest.  He was resuscitated and transferred to the intensive care unit where he received 4 units of packed red blood cells and 4 units of FFP.  Despite volume resuscitation he continued to require high doses of levo fed for blood pressure support.  CT scan of the abdomen was obtained as there was no overt evidence of ongoing GI bleeding and he was found to have a large retroperitoneal hematoma.  He was given vitamin K in addition to the FFP for correction of his coagulopathy. SIGNIFICANT PAST MEDICAL HISTORY   Chronic liver disease, complex seizure disorder status post temporal lobectomy, AVR  SIGNIFICANT EVENTS:  PEA arrest on 10/10 STUDIES:    CULTURES:  Blood urine and sputum cultures were ordered on 10/11  ANTIBIOTICS:   Verda Cumins started on 10/11  LINES/TUBES:  Left subclavian introducer placed on 10/10, debated 10/10 CONSULTANTS:  GI, neurology SUBJECTIVE:  He has been intermittently agitated and combative overnight.  He is completely disoriented during my encounter this morning.    CONSTITUTIONAL: BP (!) 100/52   Pulse 81   Temp (!) 96.2 F (35.7 C) (Axillary)   Resp (!) 22   Ht 6' (1.829 m)   Wt 125.6 kg   SpO2 99%   BMI 37.57 kg/m   I/O last 3 completed shifts: In: 1896.4 [P.O.:240; I.V.:1401.3; IV Piggyback:255.1] Out: 2895 [Urine:2895]        PHYSICAL EXAM: General: Obese male in no acute distress but intermittently belligerent without provocation.  When I asked how I could help him his response was, "disappear"  Neuro: He does not appear to be oriented to place date or circumstances today.  His pupils are equal and he is moving all fours.  I have not observed any unusual motor activity as a study his chart outside the room. HEENT:  Cardiovascular: S1 and S2 are regular, mechanical and crisp.  There is no murmur.   Lungs: Respirations are unlabored, there are no wheezes, very few scattered rhonchi.   Abdomen: The abdomen is obese soft and nontender.   Musculoskeletal: Some ecchymoses in the right antecubital fossa and a cord, chart essentially unchanged from yesterday.  Is a 2+ radial pulse Skin:    RESOLVED PROBLEM LIST   ASSESSMENT AND PLAN  1.   PEA arrest.  Appears  that the PEA arrest was on the basis of hypovolemia related to a very large retroperitoneal hemorrhage.  I have started him on prophylactic dose heparin today and resumed his usual dose of Coumadin without plans to bridge him with full dose heparin.  His usual dose of Coumadin may prove inadequate as I reversed him with vitamin K earlier in the hospital course.  He does have some sternal pain likely related to CPR and appropriate analgesics are in place.  Renal function is recovering following the arrest and I do  not anticipate any other interventions other than to keep him adequately hydrated 2.  Altered mental status with history of seizures.  He is increasingly delirious and belligerent today.  We are too far into the hospital course for this to be representative of a withdrawal phenomenon.  He is too agitated to perform any EEG or any sort of head imaging study without deep sedation and potentially reintubation.  I have therefore doubled his dose of Keppra until we can get his agitation controlled pharmacologically.  I have added a scheduled dose of Seroquel and we are titrating Precedex.  I do not perceive a metabolic provocation for his altered mental status, his white count and procalcitonin have been coming down and his mild liver function derangements have been addressed with the addition of a low-dose of lactulose.   Thyroid functions are normal.  Have discontinued his benzodiazepine.    I am most suspicious of aspiration the provocation for his white count and elevated procalcitonin and I am continuing him on Zosyn.     SUMMARY OF TODAY'S PLAN:    Best Practice / Goals of Care / Disposition.   DVT PROPHYLAXIS: Currently no pharmacologic prophylaxis due to active bleeding SUP: Protonix infusion NUTRITION: MOBILITY: GOALS OF CARE: FAMILY DISCUSSIONS:  DISPOSITION   LABS  Glucose Recent Labs  Lab 01/24/18 0434 01/24/18 0746 01/24/18 1631 01/24/18 2353 01/25/18 0413 01/25/18 0726  GLUCAP 123* 152* 135* 137* 111* 123*    BMET Recent Labs  Lab 01/23/18 0310 01/24/18 0500 01/25/18 0527  NA 135 134* 136  137  K 4.4 3.8 3.8  3.8  CL 107 106 106  107  CO2 16* 19* 22  22  BUN 70* 74* 61*  60*  CREATININE 4.10* 3.54* 2.39*  2.40*  GLUCOSE 235* 128* 128*  132*    Liver Enzymes Recent Labs  Lab 01/21/18 0655  01/22/18 1301 01/23/18 0310 01/24/18 0500 01/25/18 0527  AST 76*  --  152*  --   --   --   ALT 49*  --  88*  --   --   --   ALKPHOS 104  --  109  --   --   --    BILITOT 1.4*  --  1.6*  --   --   --   ALBUMIN 2.3*   < > 1.8* 2.4* 2.3* 2.1*   < > = values in this interval not displayed.    Electrolytes Recent Labs  Lab 01/22/18 1301 01/23/18 0310 01/24/18 0500 01/25/18 0527  CALCIUM  --  7.5* 7.7* 8.0*  7.9*  MG 2.5* 2.2  --  2.3  PHOS  --  5.5* 4.7* 3.8    CBC Recent Labs  Lab 01/23/18 1836 01/24/18 1700 01/25/18 0527  WBC 28.0* 17.5* 13.2*  HGB 10.8* 10.2* 10.2*  HCT 32.1* 30.7* 31.8*  PLT 79* 103* 92*    ABG Recent Labs  Lab 01/22/18 1502 01/22/18 1935 01/23/18 0310  PHART 7.293* 7.428 7.409  PCO2ART 32.9 23.7* 28.8*  PO2ART 324.0* 113* 93.4    Coag's Recent Labs  Lab 01/22/18 1951 01/23/18 1404 01/25/18 0527  INR 1.71 1.60 1.36    Sepsis Markers Recent Labs  Lab 01/22/18 1301 01/22/18 1339 01/22/18 1804 01/23/18 0310 01/24/18 0500  LATICACIDVEN 11.4*  --  3.9*  --   --   PROCALCITON  --  0.50  --  1.98 0.86    Cardiac Enzymes Recent Labs  Lab 01/22/18 1246 01/23/18 0310  TROPONINI 0.03* 0.04*    PAST MEDICAL HISTORY :   He  has a past medical history of COPD (chronic obstructive pulmonary disease) ([place]), Diabetes mellitus without complication ([place]), Mechanical heart valve present, Pre-diabetes, and Seizures ([place]).  PAST SURGICAL HISTORY:  He  has a past surgical history that includes Cardiac surgery; Knee surgery (Left); Joint replacement; Colon surgery; CORONARY/GRAFT ANGIOGRAPHY (N/A, 10/31/2017); Cholecystectomy; and Esophagogastroduodenoscopy (egd) with propofol (N/A, 01/18/2018).  Allergies  Allergen Reactions  . Morphine And Related Other (See Comments)    Combative     No current facility-administered medications on file prior to encounter.    Current Outpatient Medications on File Prior to Encounter  Medication Sig  . acetaminophen (TYLENOL) 650 MG CR tablet Take 1,300 mg by mouth every 8 (eight) hours as needed for pain.  [place] [facility] albuterol (PROVENTIL HFA;VENTOLIN HFA) 108 (90 Base)  MCG/ACT inhaler Inhale 2 puffs into the lungs every 6 (six) hours as needed for wheezing or shortness of breath.  [place] [facility] aspirin EC 81 MG tablet Take 81 mg by mouth daily.  [place] atorvastatin (LIPITOR) 10 MG tablet Take 1 tablet (10 mg total) by mouth daily.  . carvedilol (COREG) 6.25 MG tablet Take 1 tablet (6.25 mg total) by mouth 2 (two) times daily with a meal.  . clotrimazole (LOTRIMIN) 1 % cream Apply 1 application topically 2 (two) times daily. For 2 weeks  . furosemide (LASIX) 20 MG tablet Take 3 tablets (60 mg total) by mouth daily.  . hydrocortisone (ANUSOL-HC) 25 MG suppository Place 1 suppository (25 mg total) rectally 2 (two) times daily as needed for hemorrhoids or anal itching.  . insulin detemir (LEVEMIR) 100 UNIT/ML injection Inject 12 Units into the skin daily.  [place] [facility] levETIRAcetam (KEPPRA) 1000 MG tablet Take 1 tablet (1,000 mg total) by mouth 2 (two) times daily.  [place] lisinopril (PRINIVIL,ZESTRIL) 10 MG tablet Take 10 mg by mouth daily.  . mirtazapine (REMERON) 30 MG tablet Take 30 mg by mouth at bedtime.  . ramipril (ALTACE) 5 MG capsule Take 1 capsule (5 mg total) by mouth daily.  . ranitidine (ZANTAC) 150 MG tablet Take 150 mg by mouth 2 (two) times daily.   . tamsulosin (FLOMAX) 0.4 MG CAPS capsule Take 0.4 mg by mouth at bedtime.   . traZODone (DESYREL) 100 MG tablet Take 200 mg by mouth at bedtime.  [place] [facility] warfarin (COUMADIN) 5 MG tablet Take 5 mg by mouth See admin instructions. Take one tablet (5 mg) by mouth Monday, Tuesday, Wednesday, Friday, Saturday nights - 9pm  . warfarin (COUMADIN) 7.5 MG tablet Take 7.5 mg by mouth See admin instructions. Take one tablet (7.5 mg) by mouth on Sunday and Thursday nights - 9pm (take 5 mg tablet on all other days of the week)  . blood glucose meter kit and supplies Dispense based on patient and insurance preference. Use up to four times daily as directed. (FOR ICD-10 E10.9, E11.9).  [place] [facility]  glucose blood test strip Use as instructed  . Lancets MISC 1 Units  by Does not apply route 4 (four) times daily -  with meals and at bedtime.    FAMILY HISTORY:   His family history includes Hypertension in his father.  SOCIAL HISTORY:  He  reports that he has been smoking cigarettes. He has been smoking about 1.00 pack per day. He has never used smokeless tobacco. He reports that he drank alcohol. He reports that he does not use drugs.  REVIEW OF SYSTEMS:      Lars Masson, MD

## 2018-01-26 DIAGNOSIS — A419 Sepsis, unspecified organism: Secondary | ICD-10-CM

## 2018-01-26 DIAGNOSIS — G40909 Epilepsy, unspecified, not intractable, without status epilepticus: Secondary | ICD-10-CM

## 2018-01-26 DIAGNOSIS — G9340 Encephalopathy, unspecified: Secondary | ICD-10-CM

## 2018-01-26 DIAGNOSIS — R6521 Severe sepsis with septic shock: Secondary | ICD-10-CM

## 2018-01-26 LAB — TYPE AND SCREEN
ABO/RH(D): A POS
Antibody Screen: NEGATIVE
Unit division: 0
Unit division: 0
Unit division: 0
Unit division: 0
Unit division: 0
Unit division: 0
Unit division: 0
Unit division: 0
Unit division: 0

## 2018-01-26 LAB — BPAM RBC
Blood Product Expiration Date: 201910222359
Blood Product Expiration Date: 201910222359
Blood Product Expiration Date: 201910312359
Blood Product Expiration Date: 201911012359
Blood Product Expiration Date: 201911012359
Blood Product Expiration Date: 201911012359
Blood Product Expiration Date: 201911012359
Blood Product Expiration Date: 201911012359
Blood Product Expiration Date: 201911022359
ISSUE DATE / TIME: 201910100225
ISSUE DATE / TIME: 201910101121
ISSUE DATE / TIME: 201910101258
ISSUE DATE / TIME: 201910101325
ISSUE DATE / TIME: 201910101325
ISSUE DATE / TIME: 201910101603
ISSUE DATE / TIME: 201910101603
Unit Type and Rh: 6200
Unit Type and Rh: 6200
Unit Type and Rh: 6200
Unit Type and Rh: 6200
Unit Type and Rh: 6200
Unit Type and Rh: 6200
Unit Type and Rh: 6200
Unit Type and Rh: 6200
Unit Type and Rh: 6200

## 2018-01-26 LAB — CBC WITH DIFFERENTIAL/PLATELET
Abs Immature Granulocytes: 0.06 10*3/uL (ref 0.00–0.07)
Basophils Absolute: 0 10*3/uL (ref 0.0–0.1)
Basophils Relative: 0 %
Eosinophils Absolute: 0.2 10*3/uL (ref 0.0–0.5)
Eosinophils Relative: 2 %
HCT: 32.6 % — ABNORMAL LOW (ref 39.0–52.0)
Hemoglobin: 10.1 g/dL — ABNORMAL LOW (ref 13.0–17.0)
Immature Granulocytes: 1 %
Lymphocytes Relative: 14 %
Lymphs Abs: 1.4 10*3/uL (ref 0.7–4.0)
MCH: 26.7 pg (ref 26.0–34.0)
MCHC: 31 g/dL (ref 30.0–36.0)
MCV: 86.2 fL (ref 80.0–100.0)
Monocytes Absolute: 1.7 10*3/uL — ABNORMAL HIGH (ref 0.1–1.0)
Monocytes Relative: 16 %
Neutro Abs: 7.2 10*3/uL (ref 1.7–7.7)
Neutrophils Relative %: 67 %
Platelets: 101 10*3/uL — ABNORMAL LOW (ref 150–400)
RBC: 3.78 MIL/uL — ABNORMAL LOW (ref 4.22–5.81)
RDW: 17.2 % — ABNORMAL HIGH (ref 11.5–15.5)
WBC: 10.7 10*3/uL — ABNORMAL HIGH (ref 4.0–10.5)
nRBC: 0 % (ref 0.0–0.2)

## 2018-01-26 LAB — RENAL FUNCTION PANEL
Albumin: 2.1 g/dL — ABNORMAL LOW (ref 3.5–5.0)
Anion gap: 7 (ref 5–15)
BUN: 54 mg/dL — ABNORMAL HIGH (ref 8–23)
CO2: 23 mmol/L (ref 22–32)
Calcium: 8 mg/dL — ABNORMAL LOW (ref 8.9–10.3)
Chloride: 108 mmol/L (ref 98–111)
Creatinine, Ser: 1.92 mg/dL — ABNORMAL HIGH (ref 0.61–1.24)
GFR calc Af Amer: 40 mL/min — ABNORMAL LOW (ref >60)
GFR calc non Af Amer: 34 mL/min — ABNORMAL LOW (ref >60)
Glucose, Bld: 138 mg/dL — ABNORMAL HIGH (ref 70–99)
Phosphorus: 3.4 mg/dL (ref 2.5–4.6)
Potassium: 3.8 mmol/L (ref 3.5–5.1)
Sodium: 138 mmol/L (ref 135–145)

## 2018-01-26 LAB — GLUCOSE, CAPILLARY
Glucose-Capillary: 203 mg/dL — ABNORMAL HIGH (ref 70–99)
Glucose-Capillary: 149 mg/dL — ABNORMAL HIGH (ref 70–99)
Glucose-Capillary: 139 mg/dL — ABNORMAL HIGH (ref 70–99)
Glucose-Capillary: 147 mg/dL — ABNORMAL HIGH (ref 70–99)
Glucose-Capillary: 190 mg/dL — ABNORMAL HIGH (ref 70–99)
Glucose-Capillary: 103 mg/dL — ABNORMAL HIGH (ref 70–99)

## 2018-01-26 LAB — PROTIME-INR
INR: 1.28
Prothrombin Time: 15.8 seconds — ABNORMAL HIGH (ref 11.4–15.2)

## 2018-01-26 MED ORDER — FENTANYL CITRATE (PF) 100 MCG/2ML IJ SOLN
50.0000 ug | INTRAMUSCULAR | Status: DC | PRN
  Administered 2018-01-26 – 2018-01-27 (×4): 50 ug via INTRAVENOUS
  Filled 2018-01-26 (×4): qty 2

## 2018-01-26 MED ORDER — POTASSIUM CHLORIDE 20 MEQ PO PACK
20.0000 meq | PACK | Freq: Once | ORAL | Status: AC
  Administered 2018-01-26: 20 meq via ORAL
  Filled 2018-01-26: qty 1

## 2018-01-26 MED ORDER — FENTANYL CITRATE (PF) 100 MCG/2ML IJ SOLN
50.0000 ug | Freq: Four times a day (QID) | INTRAMUSCULAR | Status: DC | PRN
  Administered 2018-01-26: 50 ug via INTRAVENOUS
  Filled 2018-01-26: qty 2

## 2018-01-26 MED ORDER — FUROSEMIDE 10 MG/ML IJ SOLN
40.0000 mg | Freq: Once | INTRAMUSCULAR | Status: AC
  Administered 2018-01-26: 40 mg via INTRAVENOUS
  Filled 2018-01-26: qty 4

## 2018-01-26 NOTE — Plan of Care (Signed)
  Problem: Clinical Measurements: Goal: Ability to maintain clinical measurements within normal limits will improve Outcome: Progressing Goal: Will remain free from infection Outcome: Progressing Goal: Diagnostic test results will improve Outcome: Progressing Goal: Respiratory complications will improve Outcome: Progressing Goal: Cardiovascular complication will be avoided Outcome: Progressing   Problem: Activity: Goal: Risk for activity intolerance will decrease Outcome: Progressing   Problem: Coping: Goal: Level of anxiety will decrease Outcome: Progressing   Problem: Elimination: Goal: Will not experience complications related to bowel motility Outcome: Progressing   Problem: Safety: Goal: Ability to remain free from injury will improve Outcome: Progressing   Problem: Skin Integrity: Goal: Risk for impaired skin integrity will decrease Outcome: Progressing   Problem: Bowel/Gastric: Goal: Will show no signs and symptoms of gastrointestinal bleeding Outcome: Progressing   Problem: Fluid Volume: Goal: Will show no signs and symptoms of excessive bleeding Outcome: Progressing   Problem: Clinical Measurements: Goal: Complications related to the disease process, condition or treatment will be avoided or minimized Outcome: Progressing   Problem: Education: Goal: Knowledge of General Education information will improve Description Including pain rating scale, medication(s)/side effects and non-pharmacologic comfort measures Outcome: Not Progressing  Patient confused. Only oriented to name and DOB. Delirium assessment positive. Unable to educate at this time.   Problem: Health Behavior/Discharge Planning: Goal: Ability to manage health-related needs will improve Outcome: Not Progressing  Due to patients current health status, he is unable to independently manage his own health related needs.   Problem: Nutrition: Goal: Adequate nutrition will be  maintained Outcome: Not Progressing   Patient not eating well with meals. Refusing to eat despite staff assisting to feed him. Patient did drink a protein nutritional supplement tonight.  Problem: Pain Managment: Goal: General experience of comfort will improve Outcome: Not Progressing  Patient reports severe pain in his chest (post CPR), and burning with his right upper leg any time that he is awake and being mobile.    Problem: Education: Goal: Ability to identify signs and symptoms of gastrointestinal bleeding will improve Outcome: Not Progressing   Patient confused. Only oriented to name and DOB. Delirium assessment positive. Unable to educate at this time.

## 2018-01-26 NOTE — Evaluation (Signed)
Clinical/Bedside Swallow Evaluation Patient Details  Name: Adam Savage MRN: [id number] Date of Birth: Sep 09, 1950  Today's Date: 01/26/2018 Time: SLP Start Time (ACUTE ONLY): 1440 SLP Stop Time (ACUTE ONLY): 1500 SLP Time Calculation (min) (ACUTE ONLY): 20 min  Past Medical History:  Past Medical History:  Diagnosis Date  . COPD (chronic obstructive pulmonary disease) ([place])   . Diabetes mellitus without complication ([place])   . Mechanical heart valve present   . Pre-diabetes   . Seizures ([place])    Past Surgical History:  Past Surgical History:  Procedure Laterality Date  . CARDIAC SURGERY    . CHOLECYSTECTOMY    . COLON SURGERY    . CORONARY/GRAFT ANGIOGRAPHY N/A 10/31/2017   Procedure: CORONARY/GRAFT ANGIOGRAPHY;  Surgeon: Nelva Bush, MD;  Location: [facility];  Service: Cardiovascular;  Laterality: N/A;  . ESOPHAGOGASTRODUODENOSCOPY (EGD) WITH PROPOFOL N/A 01/18/2018   Procedure: ESOPHAGOGASTRODUODENOSCOPY (EGD) WITH PROPOFOL;  Surgeon: Mauri Pole, MD;  Location: [facility];  Service: Endoscopy;  Laterality: N/A;  . JOINT REPLACEMENT    . KNEE SURGERY Left    HPI:  67 year old man presented on 10/3 with upper GI bleed; found to have Mallory-Weiss tear.  Developed AMS 10/10, suffered PEA arrrest and was intubated until 10/11.  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  Clinical swallow evaluation 10/11 with recs for resuming regular diet, thin liquids; pt was confused but tolerating POs well.    Assessment / Plan / Recommendation Clinical Impression  Pt's MS is slightly improved from time of initial swallow assessment on 10/11 - today he is more consistently able  to follow commands and his communication is more topic-relevant.  However, RN states he received report that pt was coughing with liquids.  Oct 13 notes from Dr. Pearline Cables  indicate concerns for aspiration.  Lung sounds 10/14 with expiratory wheezes. Today, pt demonstrates + clinical signs of potential aspiration - three oz water challenge led to coughing, with immediate efforts by pt to suppress cough due to pain s/p chest compressions.  Nectar liquids also evoked cough response.  Pt continued to cough for several minutes.  Otherwise, oral acceptance/manipulation is [facility]; pt is attentive to POs; RR is well within the parameters of adequate swallow/respiratory timing. Recommend discontinuing HH/carb modified diet; allow  purees/applesauce/pudding from floor stock; ice chips after oral care; meds whole in puree.   SLP will f/u next date to reassess; pt may benefit from instrumental swallow study.  SLP Visit Diagnosis: Dysphagia, unspecified (R13.10)    Aspiration Risk       Diet Recommendation   NPO except allow purees/applesauce/pudding from floor stock; ice chips after oral care  Medication Administration: Whole meds with puree    Other  Recommendations Oral Care Recommendations: Oral care BID;Oral care prior to ice chip/H20   Follow up Recommendations        Frequency and Duration min 3x week  1 week       Prognosis Prognosis for Safe Diet Advancement: Good      Swallow Study   General HPI: 67 year old man presented on 10/3 with upper GI bleed; found to have Mallory-Weiss tear.  Developed AMS 10/10, suffered PEA arrrest and was intubated until 10/11.  Neurology has been following - Pt  with localization-related epilepsy  status post right temporal lobectomy noncompliant on medications, recently moved to [place] area to live with his daughter and family, history of aortic valve replacement on Coumadin, coronary artery disease,  pt with rapidly progressive dementia, acute encephalopathy.   Clinical swallow evaluation 10/11 with recs for resuming regular diet, thin liquids; pt was confused but tolerating POs well.  Type of Study: Bedside Swallow Evaluation Previous Swallow Assessment: yes Diet Prior to this Study: NPO Temperature Spikes Noted: No Respiratory Status: Nasal cannula History of Recent Intubation: Yes Length of Intubations (days): 1 days Date extubated: 01/23/18 Behavior/Cognition: Alert;Confused;Distractible Oral Cavity Assessment: Within Functional Limits Oral Care Completed by SLP: No Oral Cavity - Dentition: Missing dentition Vision: Functional for self-feeding Self-Feeding Abilities: Needs assist Patient Positioning: Upright in bed Baseline Vocal Quality: Normal Volitional Cough: Weak Volitional Swallow: Able to elicit    Oral/Motor/Sensory Function Overall Oral Motor/Sensory Function: Within functional limits   Ice Chips Ice chips: Within functional limits   Thin Liquid Thin Liquid: Impaired Presentation: Cup;Straw Pharyngeal  Phase Impairments: Cough - Delayed    Nectar Thick Nectar Thick Liquid: Impaired Presentation: Cup Pharyngeal Phase Impairments: Cough - Delayed   Honey Thick Honey Thick Liquid: Not tested   Puree     Solid     Solid: Not tested      Adam Savage 01/26/2018,3:19 PM   Adam Savage L. Tivis Ringer, [place] Office number [phone] Pager [phone]

## 2018-01-26 NOTE — Progress Notes (Signed)
Pharmacy Antibiotic Note  Adam Savage is a 67 y.o. male admitted  with sepsis from aspiration PNA  Pharmacy was  consulted for Zosyn dosing.  -WBC= 10.7 ,afeb, SCr= 1.9, PCT 0.96 on 10/12 (trend down), cultures- negative thus far  Plan: Zosyn 3.375G IV q8h to be infused over 4 hours Trend WBC, temp, renal function  F/U infectious work-up  Height: 6' (182.9 cm) Weight: 294 lb 15.6 oz (133.8 kg) IBW/kg (Calculated) : 77.6  Temp (24hrs), Avg:97.4 F (36.3 C), Min:96.1 F (35.6 C), Max:98.5 F (36.9 C)  Recent Labs  Lab 01/22/18 1246 01/22/18 1301  01/22/18 1804  01/23/18 0310 01/23/18 1404 01/23/18 1836 01/24/18 0500 01/24/18 1700 01/25/18 0527 01/26/18 0350  WBC  --  35.8*   < >  --    < > 43.1* 34.6* 28.0*  --  17.5* 13.2* 10.7*  CREATININE 3.95*  --   --   --   --  4.10*  --   --  3.54*  --  2.39*  2.40* 1.92*  LATICACIDVEN  --  11.4*  --  3.9*  --   --   --   --   --   --   --   --    < > = values in this interval not displayed.    Estimated Creatinine Clearance: 52.9 mL/min (A) (by C-G formula based on SCr of 1.92 mg/dL (H)).    Allergies  Allergen Reactions  . Morphine And Related Other (See Comments)    Combative     Hildred Laser, PharmD Clinical Pharmacist Please check Amion for pharmacy contact number

## 2018-01-26 NOTE — Progress Notes (Addendum)
PULMONARY / CRITICAL CARE MEDICINE   NAME:  Adam Savage, MRN:  [id number], DOB:  1950-07-15, LOS: 75 ADMISSION DATE:  01/15/2018, CONSULTATION DATE:  10/10 REFERRING MD:  , CHIEF COMPLAINT:  PEA arrest  BRIEF HISTORY:    67 year old with chronic liver disease usually on Coumadin for a history of aortic valve replacement admitted on 10/3 for hematemesis.  EGD showed a Mallory-Weiss tear which was not actively bleeding.  Subsequent PEA arrest on 10/10 with extreme anemia.  CT scan showed a very large retroperitoneal hematoma  SIGNIFICANT PAST MEDICAL HISTORY   AS s/p AVR with mechanical valve on coumadin, epilepsy s/p temporal lobectomy, DM, questionable dementia, advanced fibrosis with chronic HCV, CKD stage 3  SIGNIFICANT EVENTS:  10/3 admitted 10/6 EGD 10/10 PEA arrest 10/11 extubated   STUDIES:   10/3 CT abd/pelvis >> No acute finding. No evidence of perianal collection or inflammation.  10/7 CTH >> - Status post right temporal lobectomy with encephalomalacia throughout this area. There is mild periventricular small vessel disease. No acute infarct. No mass or hemorrhage. - There are foci of arterial vascular calcification. Extensive post craniotomy bone defects on the right. - Extensive paranasal sinus disease on the left. 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 There is rightward deviation of the nasal septum.  10/10 CT abd/pelvis >> Large right retroperitoneal hematoma extending from posterior to the lower pole of the right kidney into the pelvis and into the extraperitoneal space in the lower pelvis. This measures 17 x 13.4 x 10.5 cm. Mild fullness of the right renal collecting system and proximal ureter may be related to mass effect from the large retroperitoneal hematoma. No visible obstructing stones. Small bilateral pleural effusions with bibasilar atelectasis.  CULTURES:  10/11 BCx 2 >> 10/11 MRSA PCR >>  neg 10/11 UC >> neg 10/11 trach asp >> normal flora  ANTIBIOTICS:  10/11 zosyn >>  LINES/TUBES:  10/10 Left subclavian introducer placed on 10/10,  10/10 ETT >> 10/11  CONSULTANTS:  GI, neurology  SUBJECTIVE:  Remains afebrile, improving WBC On room air  Of precedex since 1900 Currently off pressors as of 0700  CONSTITUTIONAL: BP 116/61   Pulse 72   Temp 98.5 F (36.9 C) (Axillary)   Resp 16   Ht 6' (1.829 m)   Wt 133.8 kg   SpO2 100%   BMI 40.01 kg/m   I/O last 3 completed shifts: In: 2244.8 [P.O.:1180; I.V.:653.8; Other:10; IV Piggyback:401] Out: 2281 [Urine:2280; Stool:1]   PHYSICAL EXAM: General:  Elderly obese male sitting upright in bed in NAD HEENT: MM pink/moist, pupils 2/reactive, slurred speech Neuro: lethargic, awakens to verbal, oriented to name, follows commands intermittently, weakly MAE CV: RR, no murmur, +2 pulses PULM: even/non-labored on room air,  lungs bilaterally clear, diminished in bases  GI: soft, non-tender, bs active  Extremities: warm/dry, +2-3 generalized edema  Skin: no rashes    RESOLVED PROBLEM LIST   ASSESSMENT AND PLAN   PEA arrest r/o hypovolemia/ hemorraghic shock from large R retroperitoneal hemorrhage  P:  hgb remains stable coags stable Off pressors  Monitor     Acute respiratory failure in the setting of cardiac arrest - now on room air P:  pulm hygiene Continue BD   AVR on coumadin P:  Coumadin restarted 10/13 per pharmacy Trend INR   HTN/ HLD P:  Continue coreg, lipitor  AKI- improving P:  D/c LR Lasix 40 mg now Trending BMP / mag/ phos/ daily wt/ urinary  output Replace electrolytes as indicated   Acute encephalopathy with acute delirium  P:  Continue keppra 1 gm BID Off precedex for now  Seroquel  MRI when able SLP evaluation and NPO except meds given ongoing slurred speech/ AMS   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   DVT PROPHYLAXIS: coumadin/ heparin SQ (for now as INR not therapeutic) SUP: Protonix infusion NUTRITION: MOBILITY: GOALS OF CARE: FAMILY DISCUSSIONS:  DISPOSITION   LABS  Glucose Recent Labs  Lab 01/25/18 1143 01/25/18 1545 01/25/18 2005 01/25/18 2346 01/26/18 0350 01/26/18 0729  GLUCAP 184* 139* 143* 150* 139* 147*    BMET Recent Labs  Lab 01/24/18 0500 01/25/18 0527 01/26/18 0350  NA 134* 136  137 138  K 3.8 3.8  3.8 3.8  CL 106 106  107 108  CO2 19* 22  22 23   BUN 74* 61*  60* 54*  CREATININE 3.54* 2.39*  2.40* 1.92*  GLUCOSE 128* 128*  132* 138*    Liver Enzymes Recent Labs  Lab 01/21/18 0655  01/22/18 1301  01/24/18 0500 01/25/18 0527 01/26/18 0350  AST 76*  --  152*  --   --   --   --   ALT 49*  --  88*  --   --   --   --   ALKPHOS 104  --  109  --   --   --   --   BILITOT 1.4*  --  1.6*  --   --   --   --   ALBUMIN 2.3*   < > 1.8*   < > 2.3* 2.1* 2.1*   < > = values in this interval not displayed.    Electrolytes Recent Labs  Lab 01/22/18 1301 01/23/18 0310 01/24/18 0500 01/25/18 0527 01/26/18 0350  CALCIUM  --  7.5* 7.7* 8.0*  7.9* 8.0*  MG 2.5* 2.2  --  2.3  --   PHOS  --  5.5* 4.7* 3.8 3.4    CBC Recent Labs  Lab 01/24/18 1700 01/25/18 0527 01/26/18 0350  WBC 17.5* 13.2* 10.7*  HGB 10.2* 10.2* 10.1*  HCT 30.7* 31.8* 32.6*  PLT 103* 92* 101*    ABG Recent Labs  Lab 01/22/18 1502  01/22/18 1935 01/23/18 0310  PHART 7.293* 7.428 7.409  PCO2ART 32.9 23.7* 28.8*  PO2ART 324.0* 113* 93.4    Coag's Recent Labs  Lab 01/23/18 1404 01/25/18 0527 01/26/18 0350  INR 1.60 1.36 1.28    Sepsis Markers Recent Labs  Lab 01/22/18 1301 01/22/18 1339 01/22/18 1804 01/23/18 0310 01/24/18 0500  LATICACIDVEN 11.4*  --  3.9*  --   --   PROCALCITON  --  0.50  --  1.98 0.86    Cardiac Enzymes Recent Labs  Lab 01/22/18 1246 01/23/18 0310  TROPONINI 0.03* 0.04*    HISTORY OF PRESENT ILLNESS        This is a  67 year old with a history of aortic valve replacement usually on Coumadin who presented with hematemesis on 10/3 upper endoscopy was performed and he was found to have a Mallory-Weiss tear.  It was not felt to be actively bleeding and was transitioned to heparin due to the presence of his aortic valve.  He has a history of a complex seizure disorder and prior temporal lobe resection for control of seizures.  By report of family his mental status has been declining and he was being evaluated for worsening mental status.  On the morning of 10/10 his hemoglobin was 6.7 and he was subsequently found unresponsive in PEA arrest.  He was resuscitated and transferred to the intensive care unit where he received 4 units of packed red blood cells and 4 units of FFP.  Despite volume resuscitation he continued to require high doses of levo fed for blood pressure support.  CT scan of the abdomen was obtained as there was no overt evidence of ongoing GI bleeding and he was found to have a large retroperitoneal hematoma.  He was given vitamin K in addition to the FFP for correction of his coagulopathy.  CCT 35 mins  Adam Savage, AGACNP-BC  Pulmonary & Critical Care Pgr: [phone] or if no answer [phone] 01/26/2018, 10:49 AM  Attending Note:  67 year old male with extensive PMH presenting with respiratory failure and septic shock from aspiration pneumonia.  On exam, lungs  with transmitted upper airway sounds and patient is confused.  I reviewed CXR myself, infiltrate noted.  Discussed with PCCM-NP and bedside RN.  Will continue to titrate levophed.  Continue abx.  Patient is extubated, will order an SLP.  D/C precedex drip.  If no improvement after off sedation in mental status may need to consult with neurology.  Hold in the ICU for observation.  Retroperitoneal disease noted, stable Hg, started coumadin 10/13, will not start a heparin drip and allow INR to drift upward given bleed.  Hold in the ICU for levophed and close observation.  The patient is critically ill with multiple organ systems failure and requires high complexity decision making for assessment and support, frequent evaluation and titration of therapies, application of advanced monitoring technologies and extensive interpretation of multiple databases.   Critical Care Time devoted to patient care services described in this note is  32  Minutes. This time reflects time of care of this signee Dr Jennet Maduro. This critical care time does not reflect procedure time, or teaching time or supervisory time of PA/NP/Med student/Med Resident etc but could involve care discussion time.  Rush Farmer, M.D. [facility] Pulmonary/Critical Care Medicine. Pager: [phone]. After hours pager: [phone].

## 2018-01-27 ENCOUNTER — Inpatient Hospital Stay: Payer: Self-pay

## 2018-01-27 ENCOUNTER — Inpatient Hospital Stay (HOSPITAL_COMMUNITY): Payer: Medicare Other

## 2018-01-27 DIAGNOSIS — I469 Cardiac arrest, cause unspecified: Secondary | ICD-10-CM

## 2018-01-27 DIAGNOSIS — N179 Acute kidney failure, unspecified: Secondary | ICD-10-CM

## 2018-01-27 DIAGNOSIS — D500 Iron deficiency anemia secondary to blood loss (chronic): Secondary | ICD-10-CM

## 2018-01-27 LAB — RENAL FUNCTION PANEL
Albumin: 2 g/dL — ABNORMAL LOW (ref 3.5–5.0)
Anion gap: 10 (ref 5–15)
BUN: 47 mg/dL — ABNORMAL HIGH (ref 8–23)
CO2: 24 mmol/L (ref 22–32)
Calcium: 8 mg/dL — ABNORMAL LOW (ref 8.9–10.3)
Chloride: 104 mmol/L (ref 98–111)
Creatinine, Ser: 1.72 mg/dL — ABNORMAL HIGH (ref 0.61–1.24)
GFR calc Af Amer: 46 mL/min — ABNORMAL LOW (ref >60)
GFR calc non Af Amer: 39 mL/min — ABNORMAL LOW (ref >60)
Glucose, Bld: 228 mg/dL — ABNORMAL HIGH (ref 70–99)
Phosphorus: 3.4 mg/dL (ref 2.5–4.6)
Potassium: 3.8 mmol/L (ref 3.5–5.1)
Sodium: 138 mmol/L (ref 135–145)

## 2018-01-27 LAB — GLUCOSE, CAPILLARY
Glucose-Capillary: 117 mg/dL — ABNORMAL HIGH (ref 70–99)
Glucose-Capillary: 135 mg/dL — ABNORMAL HIGH (ref 70–99)
Glucose-Capillary: 163 mg/dL — ABNORMAL HIGH (ref 70–99)
Glucose-Capillary: 164 mg/dL — ABNORMAL HIGH (ref 70–99)
Glucose-Capillary: 176 mg/dL — ABNORMAL HIGH (ref 70–99)
Glucose-Capillary: 219 mg/dL — ABNORMAL HIGH (ref 70–99)
Glucose-Capillary: 236 mg/dL — ABNORMAL HIGH (ref 70–99)

## 2018-01-27 LAB — PROTIME-INR
INR: 1.43
Prothrombin Time: 17.3 seconds — ABNORMAL HIGH (ref 11.4–15.2)

## 2018-01-27 LAB — CBC
HCT: 31 % — ABNORMAL LOW (ref 39.0–52.0)
Hemoglobin: 9.9 g/dL — ABNORMAL LOW (ref 13.0–17.0)
MCH: 27.7 pg (ref 26.0–34.0)
MCHC: 31.9 g/dL (ref 30.0–36.0)
MCV: 86.8 fL (ref 80.0–100.0)
Platelets: 102 10*3/uL — ABNORMAL LOW (ref 150–400)
RBC: 3.57 MIL/uL — ABNORMAL LOW (ref 4.22–5.81)
RDW: 17.2 % — ABNORMAL HIGH (ref 11.5–15.5)
WBC: 8.9 10*3/uL (ref 4.0–10.5)
nRBC: 0 % (ref 0.0–0.2)

## 2018-01-27 LAB — MAGNESIUM: Magnesium: 1.9 mg/dL (ref 1.7–2.4)

## 2018-01-27 MED ORDER — WARFARIN SODIUM 7.5 MG PO TABS
7.5000 mg | ORAL_TABLET | Freq: Once | ORAL | Status: AC
  Administered 2018-01-28: 7.5 mg via ORAL
  Filled 2018-01-27: qty 1

## 2018-01-27 MED ORDER — POTASSIUM CHLORIDE 20 MEQ PO PACK
20.0000 meq | PACK | Freq: Once | ORAL | Status: AC
  Administered 2018-01-27: 20 meq via ORAL
  Filled 2018-01-27: qty 1

## 2018-01-27 MED ORDER — INSULIN GLARGINE 100 UNIT/ML ~~LOC~~ SOLN
20.0000 [IU] | Freq: Every day | SUBCUTANEOUS | Status: DC
  Administered 2018-01-27 – 2018-01-30 (×4): 20 [IU] via SUBCUTANEOUS
  Filled 2018-01-27 (×4): qty 0.2

## 2018-01-27 MED ORDER — CARVEDILOL 3.125 MG PO TABS
3.1250 mg | ORAL_TABLET | Freq: Two times a day (BID) | ORAL | Status: DC
  Administered 2018-01-27 – 2018-01-30 (×7): 3.125 mg via ORAL
  Filled 2018-01-27 (×7): qty 1

## 2018-01-27 MED ORDER — WARFARIN - PHARMACIST DOSING INPATIENT
Freq: Every day | Status: DC
  Administered 2018-01-28 – 2018-01-30 (×2)

## 2018-01-27 MED ORDER — LORAZEPAM 2 MG/ML IJ SOLN
0.5000 mg | INTRAMUSCULAR | Status: DC | PRN
  Administered 2018-01-27 – 2018-01-29 (×6): 0.5 mg via INTRAVENOUS
  Filled 2018-01-27 (×6): qty 1

## 2018-01-27 MED ORDER — FUROSEMIDE 10 MG/ML IJ SOLN
40.0000 mg | Freq: Once | INTRAMUSCULAR | Status: AC
  Administered 2018-01-27: 40 mg via INTRAVENOUS
  Filled 2018-01-27: qty 4

## 2018-01-27 MED ORDER — OXYCODONE HCL 5 MG PO TABS
5.0000 mg | ORAL_TABLET | Freq: Four times a day (QID) | ORAL | Status: DC | PRN
  Administered 2018-01-27 – 2018-01-30 (×4): 5 mg via ORAL
  Filled 2018-01-27 (×4): qty 1

## 2018-01-27 MED ORDER — RESOURCE THICKENUP CLEAR PO POWD
ORAL | Status: DC | PRN
  Filled 2018-01-27: qty 125

## 2018-01-27 NOTE — Evaluation (Signed)
Physical Therapy Evaluation Patient Details Name: Adam Savage MRN: [id number] DOB: [birth year] Today's Date: 01/27/2018   History of Present Illness  Pt is a 67 y/o male admitted secondary to increased rectal pain, rectal bleeding, and hemorrhoids. Thought to have perianal infection. PMH includes hepatitis C, AVR, DM, COPD, and seizures. Pt with increased agitation and neurology consulted. Underwent EEG with revealed abnormalities consistent with imaging. Pending MRI. Pt s/p upper GI endoscopy which revealed Mallory Weiss tear.  Pt with PEA arrest 01/22/18 requiring CPR and intubation. Pt extubated 01/23/18.     Clinical Impression  Pt admitted with above diagnosis. Pt currently with functional limitations due to the deficits listed below (see PT Problem List). On eval, pt required max assist rolling, mod assist supine to sit, min assist sit to stand with RW, and min assist +2 safety SPT bed to recliner with RW. Pt with primary c/o chest pain from CPR. He presents with confusion and agitation. Pt will benefit from skilled PT to increase their independence and safety with mobility to allow discharge to the venue listed below.       Follow Up Recommendations SNF;Supervision/Assistance - 24 hour    Equipment Recommendations  None recommended by PT    Recommendations for Other Services       Precautions / Restrictions Precautions Precautions: Fall Restrictions Weight Bearing Restrictions: No      Mobility  Bed Mobility Overal bed mobility: Needs Assistance Bed Mobility: Rolling Rolling: Max assist   Supine to sit: Mod assist;HOB elevated     General bed mobility comments: increased time and effort, continuous verbal cues for sequencing  Transfers Overall transfer level: Needs assistance Equipment used: Rolling walker (2 wheeled) Transfers: Sit to/from [facility] Sit to Stand: Min assist Stand pivot transfers: Min assist;+2 safety/equipment       General  transfer comment: cues for hand placement, assist to power up and stabilize standing balance, cues for sequencing  Ambulation/Gait                Stairs            Wheelchair Mobility    Modified Rankin (Stroke Patients Only)       Balance Overall balance assessment: Needs assistance Sitting-balance support: No upper extremity supported;Feet supported Sitting balance-Leahy Scale: Fair     Standing balance support: Bilateral upper extremity supported;During functional activity Standing balance-Leahy Scale: Poor Standing balance comment: Reliant on UE support                              Pertinent Vitals/Pain Pain Assessment: Faces Faces Pain Scale: Hurts even more Pain Location: chest from CPR Pain Descriptors / Indicators: Grimacing;Guarding;Sore Pain Intervention(s): Monitored during session;Limited activity within patient's tolerance;Repositioned    Home Living Family/patient expects to be discharged to:: Skilled nursing facility Living Arrangements: Children Available Help at Discharge: Family;Available PRN/intermittently Type of Home: House Home Access: Level entry     Home Layout: One level Home Equipment: Walker - 2 wheels;Cane - single point;Shower seat Additional Comments: All information taken from previous eval as pt is a poor historian.     Prior Function Level of Independence: Independent with assistive device(s)         Comments: occassional use of RW     Hand Dominance   Dominant Hand: Right    Extremity/Trunk Assessment   Upper Extremity Assessment Upper Extremity Assessment: Generalized weakness    Lower Extremity Assessment Lower Extremity  Assessment: Generalized weakness       Communication   Communication: Expressive difficulties  Cognition Arousal/Alertness: Awake/alert Behavior During Therapy: Impulsive;Agitated Overall Cognitive Status: Impaired/Different from baseline Area of Impairment:  Orientation;Attention;Memory;Following commands;Safety/judgement;Awareness;Problem solving                 Orientation Level: Disoriented to;Time;Place Current Attention Level: Sustained Memory: Decreased recall of precautions;Decreased short-term memory Following Commands: Follows one step commands with increased time Safety/Judgement: Decreased awareness of safety;Decreased awareness of deficits Awareness: Emergent Problem Solving: Slow processing;Requires verbal cues General Comments: Increased agitation with redirection. Pt became beligirent and belittling as session progressed.       General Comments      Exercises     Assessment/Plan    PT Assessment Patient needs continued PT services  PT Problem List Decreased strength;Decreased balance;Decreased mobility;Decreased coordination;Decreased knowledge of use of DME;Decreased safety awareness;Decreased knowledge of precautions;Pain;Decreased cognition       PT Treatment Interventions DME instruction;Gait training;Stair training;Functional mobility training;Therapeutic activities;Therapeutic exercise;Patient/family education;Balance training;Cognitive remediation    PT Goals (Current goals can be found in the Care Plan section)  Acute Rehab PT Goals Patient Stated Goal: none stated  PT Goal Formulation: Patient unable to participate in goal setting Time For Goal Achievement: 02/10/18 Potential to Achieve Goals: Good    Frequency Min 2X/week   Barriers to discharge Decreased caregiver support      Co-evaluation               AM-PAC PT "6 Clicks" Daily Activity  Outcome Measure Difficulty turning over in bed (including adjusting bedclothes, sheets and blankets)?: A Lot Difficulty moving from lying on back to sitting on the side of the bed? : Unable Difficulty sitting down on and standing up from a chair with arms (e.g., wheelchair, bedside commode, etc,.)?: A Lot Help needed moving to and from a bed to  chair (including a wheelchair)?: A Little Help needed walking in hospital room?: A Lot Help needed climbing 3-5 steps with a railing? : Total 6 Click Score: 11    End of Session Equipment Utilized During Treatment: Gait belt Activity Tolerance: Treatment limited secondary to agitation;Patient limited by pain Patient left: in chair;with call bell/phone within reach;with nursing/sitter in room Nurse Communication: Mobility status PT Visit Diagnosis: Other abnormalities of gait and mobility (R26.89);Muscle weakness (generalized) (M62.81)    Time: 8099-8338 PT Time Calculation (min) (ACUTE ONLY): 17 min   Charges:   PT Evaluation $PT Eval Moderate Complexity: 1 Mod          Lorrin Goodell, PT  Office # [phone] Pager [phone]   Lorriane Shire 01/27/2018, 9:51 AM

## 2018-01-27 NOTE — Progress Notes (Signed)
Modified Barium Swallow Progress Note  Patient Details  Name: Adam Savage MRN: [id number] Date of Birth: 27-Apr-1950  Today's Date: 01/27/2018  Modified Barium Swallow completed.  Full report located under Chart Review in the Imaging Section.  Brief recommendations include the following:  Clinical Impression  Pt presented with moderate pharyngeal dysphagia marked by incomplete airway protection and pharyngeal/laryngeal weakness. He demonstrated altered mentation, impulsivity, and required max verbal/tactile cues to take small sips/bites throughout our study. Exhibited laryngeal penetration followed by gross aspiration with thin during subsequent trial resulting from and significantly delayed epiglottic inversion. Pt's reflexive cough was weak, painful (due to recent chest compressions) and ineffective to clear aspirate. Laryngeal weakness was also evidenced across all textures marked by decreased hyolaryngeal anterior and superior excursion. While weakness and incomplete epiglottic inversion present (45 degree tilt) no airway intrusion was noted with small sips of nectar thick or regular texture. Given degree of confusion and impulsivity, recommend dysphagia 2 (minced) diet, nectar thick liquids with FULL supervision to ensure small bites/sips, slow rate, and purposeful intermittent throat clear as an added precation. ST will continue to follow pt to provide treatment with diet safety and efficiency including potential for advancement.   Swallow Evaluation Recommendations       SLP Diet Recommendations: Dysphagia 2 (Fine chop) solids;Nectar thick liquid   Liquid Administration via: Cup;No straw   Medication Administration: Whole meds with puree   Supervision: Full supervision/cueing for compensatory strategies;Patient able to self feed   Compensations: Minimize environmental distractions;Slow rate;Small sips/bites;Clear throat intermittently   Postural Changes: Seated upright at 90  degrees   Oral Care Recommendations: Oral care BID   Other Recommendations: Order thickener from pharmacy    Houston Siren 01/27/2018,1:48 PM   Orbie Pyo Colvin Caroli.Ed Risk analyst [phone] Office [phone]

## 2018-01-27 NOTE — Care Management Note (Signed)
Case Management Note Initial CM Note documented by Tomi Bamberger RNCM Patient Details  Name: Kylee Nardozzi MRN: [id number] Date of Birth: 1950-06-23  Subjective/Objective:   Patient usually lives with daughter at home, but was just recently dischaged from [facility] on 9/30 per CSW note.  He presents with upper GIB, aS s/p mech valve replacemtn hx, encephalopathy, peranal and gluteal intertrijo, internal hemorrhoids, hep c. Pt rec SNF.                  Action/Plan: NCM will follow for transition of care needs.   Expected Discharge Date:  01/19/18               Expected Discharge Plan:  Skilled Nursing Facility  In-House Referral:  Clinical Social Work  Discharge planning Services  CM Consult  Post Acute Care Choice:    Choice offered to:     DME Arranged:    DME Agency:     HH Arranged:    [place] Agency:     Status of Service:  In process, will continue to follow  If discussed at Long Length of Stay Meetings, dates discussed:  01/27/18  Additional Comments: 01/27/18 @ 1415-Anwyn Kriegel Reece Leader- Patient discussed during [place] LOS meeting. CM spoke to Dr. Nelda Marseille to discuss POC and if patient would be appropriate for [facility]. Dr. Maryclare Bean declined, will be discussing code status with patient's daughter today. CM will continue to follow.    Midge Minium RN, BSN, NCM-BC, ACM-RN [phone] 01/27/2018, 2:14 PM

## 2018-01-27 NOTE — Progress Notes (Addendum)
ANTICOAGULATION CONSULT NOTE - Initial Consult  Pharmacy Consult for Warfarin Indication: Mechanical Valve (aortic)  Allergies  Allergen Reactions  . Morphine And Related Other (See Comments)    Combative     Patient Measurements: Height: 6' (182.9 cm) Weight: 294 lb 15.6 oz (133.8 kg) IBW/kg (Calculated) : 77.6   Vital Signs: Temp: 97.6 F (36.4 C) (10/15 0755) Temp Source: Oral (10/15 0755) BP: 105/65 (10/15 0730) Pulse Rate: 74 (10/15 0900)  Labs: Recent Labs    01/25/18 0527 01/26/18 0350 01/27/18 0302  HGB 10.2* 10.1* 9.9*  HCT 31.8* 32.6* 31.0*  PLT 92* 101* 102*  LABPROT 16.6* 15.8* 17.3*  INR 1.36 1.28 1.43  CREATININE 2.39*  2.40* 1.92* 1.72*    Estimated Creatinine Clearance: 59 mL/min (A) (by C-G formula based on SCr of 1.72 mg/dL (H)).   Medical History: Past Medical History:  Diagnosis Date  . COPD (chronic obstructive pulmonary disease) ([place])   . Diabetes mellitus without complication ([place])   . Mechanical heart valve present   . Pre-diabetes   . Seizures [facility])     Assessment: [name], [name], and today is day 3 of warfarin.  INR (1.43) is subtherapeutic on warfarin 5mg  daily and no low-dose bridge given recent bleed. CBC stable. No signs of bleeding per nursing. Will increase warfarin dose.  Goal of Therapy:  INR 2-3 per [facility] clinic Monitor platelets by anticoagulation protocol: Yes   Plan:  - Warfarin 7.5mg  tomorrow (10/16) - Monitor CBC and signs of bleeding  Adam Savage, PharmD Candidate 01/27/2018,9:15 AM

## 2018-01-27 NOTE — Significant Event (Signed)
Met at bedside with patient's daughter, Caryl Pina [phone]) concerning goals of care and to give update.  She states that patient moved from [place] to live with her in July 2019 as he was unable to take care of himself and it was difficult to manage his health long distance.  Caryl Pina states her mother abruptly her father last August 2018 after 109 years of marriage, but remain legally separated.  Additionally she has a brother, but he has not helped with any of his care. Prior to her mother leaving, she had little insight to her father's behavioral/cognitive issues. She states that she has been unable to work due to caring for him at home and had ongoing concerns of safety for herself and her 30 year old daughter given his progressive decline in behavior with agitation and possible dementia.     She states that he has just continued to decline with every hospitalization and in his moments of clarity he tells her he doesn't want it anymore and that he feels like he's losing his mind.  He doesn't sleep at home and will not wear his CPAP.  I feel that she has some guilt that she has prolonged his aggressive care this far but moving forward she feels strongly that if he was to decompensate, she would want to transition care to comfort at that time and that he would want the same.  Her main concern is that he is comfortable.  She does not want any form of life support or aggressive measures, even including blood products.  If he was to become agitated again or in severe pain, she is agreement to treat this with the understanding it may have hemodynamic consequences/ instability.  I have changed his code status to reflect this discussion.  We will continue current supportive care but otherwise DNI/ DNR.  If Mr. Gayden decompensates, she wishes to be notified immediately so she can be with him.  I have added low dose ativan if needed for agitation if needed.     Kennieth Rad, AGACNP-BC [facility] Pulmonary &  Critical Care Pgr: [phone] or if no answer [phone] 01/27/2018, 1:09 PM

## 2018-01-27 NOTE — Progress Notes (Signed)
  Speech Language Pathology  Patient Details Name: Adam Savage MRN: [id number] DOB: [birth year] Today's Date: 01/27/2018 Time:  -     MBS scheduled for today at 12:00     Orbie Pyo [place] M.Ed Actor Pager [phone] Office [phone]                  GO                Houston Siren 01/27/2018, 11:13 AM

## 2018-01-27 NOTE — Progress Notes (Addendum)
PULMONARY / CRITICAL CARE MEDICINE   NAME:  Adam Savage, MRN:  [id number], DOB:  09/01/1950, LOS: 2 ADMISSION DATE:  01/15/2018, CONSULTATION DATE:  10/10 REFERRING MD:  , CHIEF COMPLAINT:  PEA arrest  BRIEF HISTORY:    67 year old with chronic liver disease usually on Coumadin for a history of aortic valve replacement admitted on 10/3 for hematemesis.  EGD showed a Mallory-Weiss tear which was not actively bleeding.  Subsequent PEA arrest on 10/10 with extreme anemia.  CT scan showed a very large retroperitoneal hematoma  SIGNIFICANT PAST MEDICAL HISTORY   AS s/p AVR with mechanical valve on coumadin, epilepsy s/p temporal lobectomy, DM, questionable dementia, advanced fibrosis with chronic HCV, CKD stage 3  SIGNIFICANT EVENTS:  10/3 admitted 10/6 EGD 10/10 PEA arrest 10/11 extubated   STUDIES:   10/3 CT abd/pelvis >> No acute finding. No evidence of perianal collection or inflammation.  10/7 CTH >> - Status post right temporal lobectomy with encephalomalacia throughout this area. There is mild periventricular small vessel disease. No acute infarct. No mass or hemorrhage. - There are foci of arterial vascular calcification. Extensive post craniotomy bone defects on the right. - Extensive paranasal sinus disease on the left. 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 There is rightward deviation of the nasal septum.  10/10 CT abd/pelvis >> Large right retroperitoneal hematoma extending from posterior to the lower pole of the right kidney into the pelvis and into the extraperitoneal space in the lower pelvis. This measures 17 x 13.4 x 10.5 cm. Mild fullness of the right renal collecting system and proximal ureter may be related to mass effect from the large retroperitoneal hematoma. No visible obstructing stones. Small bilateral pleural effusions with bibasilar atelectasis.  CULTURES:  10/11 BCx 2 >> 10/11 MRSA PCR >>  neg 10/11 UC >> neg 10/11 trach asp >> normal flora  ANTIBIOTICS:  10/11 zosyn >>10/14  LINES/TUBES:  10/10 Left subclavian introducer >>10/15 10/10 ETT >> 10/11  CONSULTANTS:  GI, neurology  SUBJECTIVE:  No events overnight, hemodynamically stable Complains of ongoing soreness in chest- worse with movement/ deep breath  Much improved mentation and agitation Still has safety sitter at bedside  CONSTITUTIONAL: BP 105/65   Pulse 76   Temp 97.6 F (36.4 C) (Oral)   Resp (!) 22   Ht 6' (1.829 m)   Wt 133.8 kg   SpO2 99%   BMI 40.01 kg/m   I/O last 3 completed shifts: In: 1696.5 [P.O.:1300; I.V.:198.5; Other:90; IV Piggyback:108] Out: 2875 [Urine:2875]   PHYSICAL EXAM: General:  Chronically ill appearing male in NAD HEENT: MM pink/moist, pupils 3/reactive, anicteric, speech more fluent today   Neuro: Much more alert, sustained, oriented to self and date, pleasant today, f/c, MAE w/generalized weakness CV: RR IR, mechanical valve PULM: even/non-labored on room air, lungs bilaterally clear  GI: obese, soft, non-tender, +BS s/p brown soft BM Extremities: warm/dry, improved UE edema, ongoing +2-3 LE edema Skin: no rashes   RESOLVED PROBLEM LIST   ASSESSMENT AND PLAN   PEA arrest secondary to hypovolemia/ hemorraghic shock from large spont R retroperitoneal hemorrhage  P:  hgb remains stable on coumadin per pharmacy  Daily CBC/ INR Close monitoring till therapeutic INR    Acute respiratory failure in the setting of cardiac arrest- resolved - now on room air - CXR 10/15 non acute P:  Aggressive pulm hygiene, IS, progress mobility with PT Continue BD   AVR on coumadin P:  Coumadin restarted 10/13  Pharmacy to dose  Daily INR   HTN/ HLD P:  Decrease coreg to 3.125mg  BID to allow for diuresis Continue lipitor   AKI- improving  - voiding without difficulty P:  Additional lasix 40mg  today and KCL 20 meq Trending BMP / mag/ phos/ daily wt/ urinary  output  Acute encephalopathy with acute delirium  - improving  P:  Continue keppra 1 gm BID Continue seroquel MRI of head, non urgent prior to d/c- to assess for progressive memory decline and behavioral disturbances Can likely d/c safety sitter  Consider psych vs neurology eval prior to discharge for progressive behavior/ cognitive decline GOC discussed with daughter and would like to discuss further.  Will consult PMT given progressive decline and frequent hospitalizations.    LA Class C reflux esophagitis with possible short-segment Barrett's on recent EGD Nonbleeding MW tear on recent EGD Advanced fibrosis with chronic HCV P:  Continue lacutulose protonix   Concern for aspiration pneumonia - remains afebrile, room air, improving WBC and CXR neg 10/15 P:  Trending WBC/ PCT / fever curve  Monitor clinically SLP following, remains NPO except meds per their recs   Hx Seizures  P:  Continue keppra  DM - ongoing hyperglycemia P:  SSI resistant Increase Lantus 20 units HS  SUMMARY OF TODAY'S PLAN:  Ongoing SLP eval Aggressive Pulm hygiene Gentle diuresis tx to SDU tilland IMTS as of 10/16.  Nothing further to add.  PCCM will sign off.  Please do not hesitate to call us back if we can be of any further assistance.  Best Practice / Goals of Care / Disposition.   DVT PROPHYLAXIS: coumadin per pharmacy  SUP: Protonix bid NUTRITION: per SLP MOBILITY: advance as able/ PT GOALS OF CARE: full code, pending PMT consult vs GOC discussion to be held with daughter today.  FAMILY DISCUSSIONS: no family at bedside.  Daughter called and updated.  She is concerned for long term dispo as she feels she is unable to safely to care for him at home. DISPOSITION  To SDU and IMTS  LABS  Glucose Recent Labs  Lab 01/26/18 0350 01/26/18 0729 01/26/18 1137 01/26/18 1946 01/27/18 0027 01/27/18 0417  GLUCAP 139* 147* 190* 103* 219* 164*    BMET Recent Labs  Lab 01/25/18 0527  01/26/18 0350 01/27/18 0302  NA 136  137 138 138  K 3.8  3.8 3.8 3.8  CL 106  107 108 104  CO2 22  22 23 24   BUN 61*  60* 54* 47*  CREATININE 2.39*  2.40* 1.92* 1.72*  GLUCOSE 128*  132* 138* 228*    Liver Enzymes Recent Labs  Lab 01/21/18 0655  01/22/18 1301  01/25/18 0527 01/26/18 0350 01/27/18 0302  AST 76*  --  152*  --   --   --   --   ALT 49*  --  88*  --   --   --   --   ALKPHOS 104  --  109  --   --   --   --   BILITOT 1.4*  --  1.6*  --   --   --   --   ALBUMIN 2.3*   < > 1.8*   < > 2.1* 2.1* 2.0*   < > = values in this interval not displayed.    Electrolytes Recent Labs  Lab 01/23/18 0310  01/25/18 0527 01/26/18 0350 01/27/18 0302  CALCIUM 7.5*   < > 8.0*  7.9* 8.0* 8.0*  MG 2.2  --  2.3  --  1.9  PHOS 5.5*   < > 3.8 3.4 3.4   < > = values in this interval not displayed.    CBC Recent Labs  Lab 01/25/18 0527 01/26/18 0350 01/27/18 0302  WBC 13.2* 10.7* 8.9  HGB 10.2* 10.1* 9.9*  HCT 31.8* 32.6* 31.0*  PLT 92* 101* 102*    ABG Recent Labs  Lab 01/22/18 1502 01/22/18 1935 01/23/18 0310  PHART 7.293* 7.428 7.409  PCO2ART 32.9 23.7* 28.8*  PO2ART 324.0* 113* 93.4    Coag's Recent Labs  Lab 01/25/18 0527 01/26/18 0350 01/27/18 0302  INR 1.36 1.28 1.43    Sepsis Markers Recent Labs  Lab 01/22/18 1301 01/22/18 1339 01/22/18 1804 01/23/18 0310 01/24/18 0500  LATICACIDVEN 11.4*  --  3.9*  --   --   PROCALCITON  --  0.50  --  1.98 0.86    Cardiac Enzymes Recent Labs  Lab 01/22/18 1246 01/23/18 0310  TROPONINI 0.03* 0.04*     Kennieth Rad, AGACNP-BC [facility] Pgr: [phone] or if no answer [phone] 01/27/2018, 9:10 AM  Attending Note:  67 year old with PMH of AS s/p mechanical AVR on coumadin who present post cardiac arrest and retroperitoneal hematoma.  On exam, much more alert this AM and following some commands.  Of heparin and Hg is stable.  I reviewed CXR myself, no acute disease  noted.  Discussed with PCCM-NP.  Valve replacement:  - Coumadin per pharmacy  - Begin heparin without a bolus  Retroperitoneal hematoma:  - Follow CBC closely now that we are starting anti-coagulation  Hemorrhagic anemia:  - Transfuse for Hg of 7  Hypoxemia:  - Titrate O2 for sat of 88-92%  HTD:  - Coreg 3.125 BID  Hypokalemia:  - Replace  - Recheck  GOC: communicated with daughter, she is to arrive to dsicuss plan of care.  Transfer out of the ICU and back to IMTS with PCCM off 10/16.  Patient seen and examined, agree with above note.  I dictated the care and orders written for this patient under my direction.  Rush Farmer, [place]

## 2018-01-27 NOTE — Plan of Care (Signed)
  Problem: Education: Goal: Knowledge of General Education information will improve Description Including pain rating scale, medication(s)/side effects and non-pharmacologic comfort measures Outcome: Not Progressing  Pt needs to frequently educated on reasons for staying in bed, not pulling on lines or catheter, and why he needs to remain in the hospital  Problem: Health Behavior/Discharge Planning: Goal: Ability to manage health-related needs will improve Outcome: Progressing   Problem: Clinical Measurements: Goal: Respiratory complications will improve Outcome: Progressing Goal: Cardiovascular complication will be avoided Outcome: Progressing   Problem: Nutrition: Goal: Adequate nutrition will be maintained Outcome: Progressing   Problem: Coping: Goal: Level of anxiety will decrease Outcome: Not Progressing  Pt has intermittent agitated and is very restless, prone to picking at things and verbal abuse Problem: Elimination: Goal: Will not experience complications related to bowel motility Outcome: Progressing   Problem: Pain Managment: Goal: General experience of comfort will improve Outcome: Progressing   Problem: Safety: Goal: Ability to remain free from injury will improve Outcome: Progressing   Problem: Skin Integrity: Goal: Risk for impaired skin integrity will decrease Outcome: Progressing

## 2018-01-28 DIAGNOSIS — E785 Hyperlipidemia, unspecified: Secondary | ICD-10-CM

## 2018-01-28 DIAGNOSIS — I10 Essential (primary) hypertension: Secondary | ICD-10-CM

## 2018-01-28 DIAGNOSIS — K661 Hemoperitoneum: Secondary | ICD-10-CM

## 2018-01-28 LAB — RENAL FUNCTION PANEL
Albumin: 2.1 g/dL — ABNORMAL LOW (ref 3.5–5.0)
Anion gap: 9 (ref 5–15)
BUN: 39 mg/dL — ABNORMAL HIGH (ref 8–23)
CO2: 24 mmol/L (ref 22–32)
Calcium: 8.2 mg/dL — ABNORMAL LOW (ref 8.9–10.3)
Chloride: 105 mmol/L (ref 98–111)
Creatinine, Ser: 1.47 mg/dL — ABNORMAL HIGH (ref 0.61–1.24)
GFR calc Af Amer: 55 mL/min — ABNORMAL LOW (ref >60)
GFR calc non Af Amer: 48 mL/min — ABNORMAL LOW (ref >60)
Glucose, Bld: 119 mg/dL — ABNORMAL HIGH (ref 70–99)
Phosphorus: 3.9 mg/dL (ref 2.5–4.6)
Potassium: 4 mmol/L (ref 3.5–5.1)
Sodium: 138 mmol/L (ref 135–145)

## 2018-01-28 LAB — CBC
HCT: 35.6 % — ABNORMAL LOW (ref 39.0–52.0)
Hemoglobin: 10.8 g/dL — ABNORMAL LOW (ref 13.0–17.0)
MCH: 27 pg (ref 26.0–34.0)
MCHC: 30.3 g/dL (ref 30.0–36.0)
MCV: 89 fL (ref 80.0–100.0)
Platelets: 114 10*3/uL — ABNORMAL LOW (ref 150–400)
RBC: 4 MIL/uL — ABNORMAL LOW (ref 4.22–5.81)
RDW: 17.5 % — ABNORMAL HIGH (ref 11.5–15.5)
WBC: 10.5 10*3/uL (ref 4.0–10.5)
nRBC: 0 % (ref 0.0–0.2)

## 2018-01-28 LAB — CULTURE, BLOOD (ROUTINE X 2)
Culture: NO GROWTH
Culture: NO GROWTH

## 2018-01-28 LAB — GLUCOSE, CAPILLARY
Glucose-Capillary: 100 mg/dL — ABNORMAL HIGH (ref 70–99)
Glucose-Capillary: 103 mg/dL — ABNORMAL HIGH (ref 70–99)
Glucose-Capillary: 110 mg/dL — ABNORMAL HIGH (ref 70–99)
Glucose-Capillary: 117 mg/dL — ABNORMAL HIGH (ref 70–99)
Glucose-Capillary: 122 mg/dL — ABNORMAL HIGH (ref 70–99)
Glucose-Capillary: 155 mg/dL — ABNORMAL HIGH (ref 70–99)

## 2018-01-28 LAB — PROTIME-INR
INR: 1.52
Prothrombin Time: 18.2 seconds — ABNORMAL HIGH (ref 11.4–15.2)

## 2018-01-28 NOTE — Progress Notes (Signed)
  Speech Language Pathology Treatment: Dysphagia  Patient Details Name: Adam Savage MRN: [id number] DOB: [birth year] Today's Date: 01/28/2018 Time: 5697-9480 SLP Time Calculation (min) (ACUTE ONLY): 12 min  Assessment / Plan / Recommendation Clinical Impression  Pt presented with increased confusion and lethargy compared to yesterday's ST visit, requiring continous stimulation to remain alert and participate in dysphagia treatment. Given mod-max verbal/tactile cues, pt exhibited wet vocal quality and immediate throat clear following deglutition of small sips nectar. Chest pain (from recent compressions) continues to inhibit pt from exhibiting strong cough. Due to increasing lethargy throughout session, no further PO trials attempted. Recommend continue D2 (minced) diet, nectar thick liquids, FULL supervision, when adequately alert and ST will continue to provide treatment with diet safety and efficiency.    HPI HPI: 67 year old man presented on 10/3 with upper GI bleed; found to have Mallory-Weiss tear.  Developed AMS 10/10, suffered PEA arrrest and was intubated until 10/11.  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  Clinical swallow evaluation 10/11 with recs for resuming regular diet, thin liquids; pt was confused but tolerating POs well.       SLP Plan  Continue with current plan of care       Recommendations  Diet recommendations: Dysphagia 2 (fine chop);Nectar-thick liquid Liquids provided via: Cup Medication Administration: Crushed with puree Supervision: Staff to assist with self feeding;Full supervision/cueing for compensatory strategies Compensations: Minimize environmental  distractions;Slow rate;Small sips/bites;Clear throat intermittently Postural Changes and/or Swallow Maneuvers: Seated upright 90 degrees                Oral Care Recommendations: Oral care BID Follow up Recommendations: Skilled Nursing facility SLP Visit Diagnosis: Dysphagia, unspecified (R13.10) Plan: Continue with current plan of care       Jettie Booze, Student SLP                 Jettie Booze 01/28/2018, 3:16 PM

## 2018-01-28 NOTE — Progress Notes (Signed)
Clinical Social Worker following patient for support and discharge needs. Patient was at [facility] care prior to coming to hospital. CSW reached out to facility and they stated they will be able to take patient back once medically stable. CSW to continue to follow for support and discharge needs.   Rhea Pink, MSW,  [place]

## 2018-01-28 NOTE — Progress Notes (Signed)
Pt received form 2H, alert and oriented to self. Easily reoriented but needed constant reminder. Agitated and impulsive. constantly pulling on EKG wires. Mittens applied. Bed fast. 2 person assist with ADLs. CHG bath completed, CCMD notified. Bed alarm on. Call light within reach. Will continue to monitor.

## 2018-01-28 NOTE — Progress Notes (Signed)
Subjective: He is intermittently falling asleep. Is not completely oriented but try to communicate. Follows the commands.  Objective:  Vital signs in last 24 hours: Vitals:   01/28/18 0050 01/28/18 0356 01/28/18 0748 01/28/18 1231  BP: 97/65 121/65 126/64 136/65  Pulse: 73 79 95 89  Resp: 16 17 18 19   Temp: 98.1 F (36.7 C) 97.7 F (36.5 C) 98 F (36.7 C) 97.6 F (36.4 C)  TempSrc: Axillary Axillary Axillary Axillary  SpO2: 97% 98% 97% 99%  Weight:      Height:       Physical Exam  Constitutional: No distress.  HENT:  Head: Normocephalic.  Eyes: EOM are normal.  Cardiovascular: Normal rate and regular rhythm. + Pulmonary/Chest: Effort normal and breath sounds normal. No respiratory distress.  Abdominal: Soft. Bowel sounds are normal. He exhibits distension. There is no tenderness.  Musculoskeletal: He exhibits no edema or tenderness.  Has tenderness on his perianal area with some erythema and scrotal swelling BMP Latest Ref Rng & Units 01/28/2018 01/27/2018 01/26/2018  Glucose 70 - 99 mg/dL 119(H) 228(H) 138(H)  BUN 8 - 23 mg/dL 39(H) 47(H) 54(H)  Creatinine 0.61 - 1.24 mg/dL 1.47(H) 1.72(H) 1.92(H)  BUN/Creat Ratio 10 - 24 - - -  Sodium 135 - 145 mmol/L 138 138 138  Potassium 3.5 - 5.1 mmol/L 4.0 3.8 3.8  Chloride 98 - 111 mmol/L 105 104 108  CO2 22 - 32 mmol/L 24 24 23   Calcium 8.9 - 10.3 mg/dL 8.2(L) 8.0(L) 8.0(L)   Assessment/Plan:  Principal Problem:   Mallory-Weiss tear Active Problems:   Seizure disorder (HCC)   Chronic anticoagulation   Internal hemorrhoid, bleeding   Perianal infection   Blood loss anemia   S/P AVR (aortic valve replacement)   Acute encephalopathy   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 Also complicated with altered mental status. He was complicated with PEA arrest at 10/10, 2/2 hypovolemia/ hemorraghic shock from large R retroperitoneal hemorrhage, resuscitated well  hgb remained stable, coags stable, Off pressors and came back from ICU.   Altered mental status: Has been worsened since coming from ICU.  Progressive dementia secondary to previous temporal lobectomy due to seizure.  -Continue Seroquel 25 mg BID -Follow-up with neurology outpatient considering additional treatment required -Keppra to 1000 mg BID  -MRI when possible and able to -SLP evaluation and NPO except meds given ongoing slurred speech/ AMS -BMP daily  HTN, HLD: -C/w Coreg 3.125 mg BID -C/w Lipitor  Mechanical AV replacement:  Warfarin 7.5 mg QD (Started on 10/13. no bridge)  DM:  -C/w SSI resistant -c/wLantus 15 units HS  Perianal and gluteal intertrigo. Patient has pain on his back. On exam, has less erythema than before. Has some scrotal swelling - Continuingclotrimazole creamBID  Internal Hemorrhoids:  - Continue hydrocortisone suppositoriesand Senokot  Advanced fibrosis with chronic HCV: LA Class C reflux esophagitis with possible short-segment Barrett's on recent EGD Nonbleeding MW tear on recent EGD  -Continue lacutulose -C/w Protonix   VTE ppx: Warfarin/Hep sq (for now that INR is not therapeutic) Diet: IV f: Dispo: Anticipated discharge after hemodynamic stability  Adam Hatch, MD 01/28/2018, 5:10 PM Pager: [phone]

## 2018-01-28 NOTE — Progress Notes (Signed)
Report received form 2H rn.

## 2018-01-28 NOTE — Progress Notes (Signed)
Telesitter initiated, telesitter called back and said now pt is in vision. Floor matt placed. Pt confused, impulsive, and pulling on EKG wires. Bilateral mittens on. Will continue to monitor.

## 2018-01-28 NOTE — Progress Notes (Signed)
Internal Medicine Attending:   I saw and examined the patient. I reviewedDr Masoudi's note and I agree with the resident's findings and plan as documented in the resident's note with the following additions. We updated his daughter at bedside, she confirms to [place] patient would not want more aggressive care than he has had to this point, if he were to deterioate again would not want to go back to ICU, wants DNR Code status.   His mental status remains abnormal, he continues to have trouble with short term memory and appears to be struggling to reconcile sensory disturbances and pain with what is going on ("cold cups" (Cold hands?), "burning" (pointing to right leg) On examination his lungs are clear heart RRR, abd soft and nontender, he does have parianal and scrotal irration, he has right posterior leg tenderness and induration  A/P Acute blood loss anemia 2/2 first mallory weiss tear followed by spontaneous retroperitonal hematoma - Hgb stable - continue to monitor  AVR-mechanical - Coumadin per pharm> no bridging given above  Acute encephalopathy>> suspect metabolic on top of dementia 2/2 previous neurosx - still need to obtain MRI, awaiting improvement in mentation to allow for better chance of successful imaging.

## 2018-01-28 NOTE — Progress Notes (Signed)
Pt refusing CPAP for the night. RT will continue to monitor as needed.  

## 2018-01-29 DIAGNOSIS — K625 Hemorrhage of anus and rectum: Secondary | ICD-10-CM

## 2018-01-29 DIAGNOSIS — R0689 Other abnormalities of breathing: Secondary | ICD-10-CM

## 2018-01-29 DIAGNOSIS — F0281 Dementia in other diseases classified elsewhere with behavioral disturbance: Secondary | ICD-10-CM

## 2018-01-29 DIAGNOSIS — I469 Cardiac arrest, cause unspecified: Secondary | ICD-10-CM | POA: Diagnosis not present

## 2018-01-29 DIAGNOSIS — K661 Hemoperitoneum: Secondary | ICD-10-CM | POA: Diagnosis not present

## 2018-01-29 DIAGNOSIS — G9341 Metabolic encephalopathy: Secondary | ICD-10-CM

## 2018-01-29 DIAGNOSIS — Z7189 Other specified counseling: Secondary | ICD-10-CM

## 2018-01-29 DIAGNOSIS — K683 Retroperitoneal hematoma: Secondary | ICD-10-CM

## 2018-01-29 DIAGNOSIS — Z515 Encounter for palliative care: Secondary | ICD-10-CM

## 2018-01-29 DIAGNOSIS — Z66 Do not resuscitate: Secondary | ICD-10-CM

## 2018-01-29 HISTORY — DX: Retroperitoneal hematoma: K68.3

## 2018-01-29 HISTORY — DX: Hemoperitoneum: K66.1

## 2018-01-29 LAB — CBC
HCT: 33.5 % — ABNORMAL LOW (ref 39.0–52.0)
Hemoglobin: 10.5 g/dL — ABNORMAL LOW (ref 13.0–17.0)
MCH: 28 pg (ref 26.0–34.0)
MCHC: 31.3 g/dL (ref 30.0–36.0)
MCV: 89.3 fL (ref 80.0–100.0)
Platelets: 160 10*3/uL (ref 150–400)
RBC: 3.75 MIL/uL — ABNORMAL LOW (ref 4.22–5.81)
RDW: 18.3 % — ABNORMAL HIGH (ref 11.5–15.5)
WBC: 11.5 10*3/uL — ABNORMAL HIGH (ref 4.0–10.5)
nRBC: 0 % (ref 0.0–0.2)

## 2018-01-29 LAB — RENAL FUNCTION PANEL
Albumin: 2 g/dL — ABNORMAL LOW (ref 3.5–5.0)
Anion gap: 8 (ref 5–15)
BUN: 32 mg/dL — ABNORMAL HIGH (ref 8–23)
CO2: 24 mmol/L (ref 22–32)
Calcium: 8.3 mg/dL — ABNORMAL LOW (ref 8.9–10.3)
Chloride: 110 mmol/L (ref 98–111)
Creatinine, Ser: 1.36 mg/dL — ABNORMAL HIGH (ref 0.61–1.24)
GFR calc Af Amer: >60 mL/min (ref >60)
GFR calc non Af Amer: 52 mL/min — ABNORMAL LOW (ref >60)
Glucose, Bld: 164 mg/dL — ABNORMAL HIGH (ref 70–99)
Phosphorus: 3.4 mg/dL (ref 2.5–4.6)
Potassium: 4.2 mmol/L (ref 3.5–5.1)
Sodium: 142 mmol/L (ref 135–145)

## 2018-01-29 LAB — GLUCOSE, CAPILLARY
Glucose-Capillary: 109 mg/dL — ABNORMAL HIGH (ref 70–99)
Glucose-Capillary: 112 mg/dL — ABNORMAL HIGH (ref 70–99)
Glucose-Capillary: 122 mg/dL — ABNORMAL HIGH (ref 70–99)
Glucose-Capillary: 128 mg/dL — ABNORMAL HIGH (ref 70–99)
Glucose-Capillary: 150 mg/dL — ABNORMAL HIGH (ref 70–99)
Glucose-Capillary: 166 mg/dL — ABNORMAL HIGH (ref 70–99)
Glucose-Capillary: 177 mg/dL — ABNORMAL HIGH (ref 70–99)

## 2018-01-29 LAB — PROTIME-INR
INR: 1.63
Prothrombin Time: 19.2 seconds — ABNORMAL HIGH (ref 11.4–15.2)

## 2018-01-29 MED ORDER — SODIUM CHLORIDE 0.9% FLUSH: 10.0000 mL | INTRAVENOUS | Status: DC | PRN

## 2018-01-29 MED ORDER — WARFARIN SODIUM 7.5 MG PO TABS
7.5000 mg | ORAL_TABLET | Freq: Once | ORAL | Status: AC
  Administered 2018-01-29: 7.5 mg via ORAL
  Filled 2018-01-29: qty 1

## 2018-01-29 NOTE — Progress Notes (Signed)
Physical Therapy Treatment Patient Details Name: Adam Savage MRN: [id number] DOB: 06-09-1950 Today's Date: 01/29/2018    History of Present Illness Pt is a 67 y.o. male admitted 01/15/18 secondary to increased rectal pain, rectal bleeding, and hemorrhoids; thought to have perianal infection. S/p upper GI endoscopy revealing Mallory Weiss tear. Suffered PEA arrest on 10/10 secondary to hypovolemia, hemorraghic shock for large right retroperitoneal hemorrhage, requiring CPR; ETT 10/10-10/11. Pt with increased agitation and delirium. Awaiting MRI. PMH includes hepatitis C, AVR, DM, COPD, seizures.    PT Comments    Pt continues to demonstrate confusion and agitation, requiring maxA (+2 safety) for bed mobility and pericare secondary to bowel incontinence. Pt resisting assistance, able to be redirected for a few seconds at a time and intermittently follow simple commands. Will continue to follow acutely and progress mobility as appropriate.    Follow Up Recommendations  SNF;LTACH;Supervision/Assistance - 24 hour     Equipment Recommendations  None recommended by PT    Recommendations for Other Services       Precautions / Restrictions Precautions Precautions: Fall Precaution Comments: Bilat soft mitts; agitation/confusion Restrictions Weight Bearing Restrictions: No    Mobility  Bed Mobility Overal bed mobility: Needs Assistance Bed Mobility: Rolling Rolling: Max assist;+2 for safety/equipment         General bed mobility comments: MaxA (+2 safety) to roll R/L for pericare; pt attempting to kick, but able to be somewhat redirected. Not assisting with UEs despite cues, although demonstrating improved ability to assist with trunk control. Pt able to reposition trunk with HOB elevated and minA in prepration to drink from cup  Transfers                 General transfer comment: Deferred secondary to pt agitation  Ambulation/Gait                 Stairs              Wheelchair Mobility    Modified Rankin (Stroke Patients Only)       Balance                                            Cognition Arousal/Alertness: Awake/alert Behavior During Therapy: Impulsive;Agitated;Restless Overall Cognitive Status: Impaired/Different from baseline Area of Impairment: Orientation;Attention;Memory;Following commands;Safety/judgement;Awareness;Problem solving                 Orientation Level: Disoriented to;Time;Place;Situation Current Attention Level: Sustained Memory: Decreased recall of precautions;Decreased short-term memory Following Commands: Follows one step commands inconsistently Safety/Judgement: Decreased awareness of safety;Decreased awareness of deficits Awareness: Intellectual Problem Solving: Slow processing;Requires verbal cues;Decreased initiation General Comments: Pt confused and agitated throughout bed mobility for pericare due to bowel incontinence. Able to be redirected for ~15 sec with conversation, then returning to moaning or incoherent/confused speech. "I don't know why Adam Savage brought me cookies yesterday"      Exercises      General Comments General comments (skin integrity, edema, etc.): RN/NT present      Pertinent Vitals/Pain Pain Assessment: Faces Faces Pain Scale: Hurts little more Pain Location: "Right in the middle of my chest" Pain Descriptors / Indicators: Grimacing;Moaning Pain Intervention(s): Monitored during session;Limited activity within patient's tolerance    Home Living                      Prior Function  PT Goals (current goals can now be found in the care plan section) Acute Rehab PT Goals Patient Stated Goal: "I want some coca cola" PT Goal Formulation: With patient Time For Goal Achievement: 02/10/18 Potential to Achieve Goals: Fair Progress towards PT goals: Not progressing toward goals - comment(Limited by confusion/agitation)     Frequency    Min 2X/week      PT Plan Current plan remains appropriate    Co-evaluation              AM-PAC PT "6 Clicks" Daily Activity  Outcome Measure  Difficulty turning over in bed (including adjusting bedclothes, sheets and blankets)?: Unable Difficulty moving from lying on back to sitting on the side of the bed? : Unable Difficulty sitting down on and standing up from a chair with arms (e.g., wheelchair, bedside commode, etc,.)?: Unable Help needed moving to and from a bed to chair (including a wheelchair)?: A Lot Help needed walking in hospital room?: A Lot Help needed climbing 3-5 steps with a railing? : Total 6 Click Score: 8    End of Session   Activity Tolerance: Treatment limited secondary to agitation;Patient limited by pain Patient left: in bed;with call bell/phone within reach;with bed alarm set Nurse Communication: Mobility status PT Visit Diagnosis: Other abnormalities of gait and mobility (R26.89);Muscle weakness (generalized) (M62.81)     Time: 0932-3557 PT Time Calculation (min) (ACUTE ONLY): 20 min  Charges:  $Therapeutic Activity: 8-22 mins                     Adam Savage, PT, DPT Acute Rehabilitation Services  Pager [phone] Office [place] 01/29/2018, 4:25 PM

## 2018-01-29 NOTE — Progress Notes (Addendum)
NCM received call from [place] stating  MD would like LTAC referral made and stated family would like to use  [facility]. NCM paged MD to confirmed,and called daughter [name] (Daughter)     [phone]     to confirmed discussion with MD and pt's disposition. Whitman Hero RN,BSN,CM

## 2018-01-29 NOTE — Progress Notes (Signed)
Subjective: [name] was seen and evaluated this morning. He is eating breakfast. denies any pain. No acute event over night.  Objective:  Vital signs in last 24 hours: Vitals:   01/28/18 1713 01/28/18 1951 01/29/18 0040 01/29/18 0347  BP: (!) 160/68 137/77 122/60 110/67  Pulse: 80 83 96   Resp:  (!) 21 20   Temp: (!) 97.4 F (36.3 C) 98.9 F (37.2 C) 98.9 F (37.2 C) 98.8 F (37.1 C)  TempSrc: Axillary Oral Oral Oral  SpO2: 96% 100% 99%   Weight:      Height:      Physical Exam: General: In no acute distress, Nl work of breathing HEENT: EOM nl CV: Has mechanical valve sound on heart exam Lungs: CTA, No wheez, no rale Abdomen: soft, has some distension. No tenderness on exam. BS are present Extremities: has mild 1+ bilateral pitting edema up to ankles  BMP Latest Ref Rng & Units 01/29/2018 01/28/2018 01/27/2018  Glucose 70 - 99 mg/dL 164(H) 119(H) 228(H)  BUN 8 - 23 mg/dL 32(H) 39(H) 47(H)  Creatinine 0.61 - 1.24 mg/dL 1.36(H) 1.47(H) 1.72(H)  BUN/Creat Ratio 10 - 24 - - -  Sodium 135 - 145 mmol/L 142 138 138  Potassium 3.5 - 5.1 mmol/L 4.2 4.0 3.8  Chloride 98 - 111 mmol/L 110 105 104  CO2 22 - 32 mmol/L 24 24 24   Calcium 8.9 - 10.3 mg/dL 8.3(L) 8.2(L) 8.0(L)    Assessment/Plan:  Principal Problem:   Mallory-Weiss tear Active Problems:   Seizure disorder (HCC)   Chronic anticoagulation   Internal hemorrhoid, bleeding   Perianal infection   Blood loss anemia   S/P AVR (aortic valve replacement)   Acute encephalopathy   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 is a 67 y.o male with a history of AS s/p mechanical valve replacement, was on Warfarin, epilepsys/p temporal lobectomy, and DM who presented initially with worsening rectal pain and bleeding.He was doing well until 10/05 when he subsequently developed acute upper GI bleed as outlined  below. Also complicated with altered mental status. He was complicated with PEA arrest at 10/10, 2/2 hypovolemia/ hemorraghic shock from large R retroperitoneal hemorrhage, resuscitated well  hgb remained stable, coags stable, Off pressorsand came back from ICU.   Altered mental status: Has been worsened since coming from ICU.  Progressive dementia secondary to previous temporal lobectomy due to seizure.  -Continue Seroquel 25 mg BID -Follow-up with neurology outpatient considering additional treatment required -Keppra to 1000 mg BID  -MRI when possible and able to -SLP evaluation and NPO except meds given ongoing slurred speech/ AMS -BMP daily -Patient may discharge to go to a long care hospital at [place]. Will discuss this with the daughter to see what is her wishes.  HTN, HLD: BP today: 110/67 -C/w Coreg 3.125 mg BID -C/w Lipitor  Mechanical AV replacement: INR is 1.6, still not therapeutic. (got Vit K in ICU) -Warfarin per pharmacy (Started on 10/13. no bridge)  DM:  -C/w SSI resistant -c/wLantus 15 units HS  Perianal and gluteal intertrigo.He feels better today. Denies pain - Continuingclotrimazole creamBID  Internal Hemorrhoids: no bleeding - Continue hydrocortisone suppositoriesand Senokot  Advanced fibrosis with chronic HCV: LA Class C reflux esophagitis with possible short-segment Barrett's on recent EGD Nonbleeding MW tear on recent EGD  -Continuelacutulose -C/w Protonix  VTE ppx: Warfarin (is still subtherapeutic)+ SCD Diet: Carb modified  IV f: none Code: DNR    Dispo: May discharge to [facility] today if family agree  Dewayne Hatch, MD 01/29/2018, 6:16 AM Pager: [phone]

## 2018-01-29 NOTE — Progress Notes (Signed)
Internal Medicine Attending:   I saw and examined the patient. I reviewed Dr Darcey Nora note and I agree with the resident's findings and plan as documented in the resident's note with the following additions:  He continues to experience in hospital delirum complicating his progressive dementia after temporal lobectomy.  - we will continue to work on reorientation and supportive care, he does appear mildly improved today but difficult to judge. We are continuing to dose his warfarin without heparin bridge, he did receive Vit K reveral in the ICU. Family is meeting with palliative care today for formal GOC. His recovery process will likely be long, agree he may be a candidate for LTAC if family is in agreement, Ideally I would like to obtain MRI of the brain before transfer.

## 2018-01-29 NOTE — Progress Notes (Signed)
SLP Cancellation Note  Patient Details Name: Jigar Zielke MRN: [id number] DOB: 02-07-1951   Cancelled treatment:        Attempted to see this morning however pt having central line placed. Will attempt tomorrow.   Houston Siren 01/29/2018, 4:35 PM

## 2018-01-29 NOTE — Progress Notes (Addendum)
NCM received return call from pt's daughter, Caryl Pina regarding LTAC. Caryl Pina confirmed discussion had with MD and agreeable to [facility] if appropriate. NCM discussed case with Zack  Asst Director of Soc Work and Dr. Rockne Menghini Med Director for LTAC appropriateness, she will review the case and follow up with the NCM with her determination. CM will continue to follow patient for progression of care.  Whitman Hero RN,BSN,CM

## 2018-01-29 NOTE — Progress Notes (Signed)
RT NOTE:  Pt refuses CPAP tonight. 

## 2018-01-29 NOTE — Progress Notes (Signed)
ANTICOAGULATION CONSULT NOTE - Initial Consult  Pharmacy Consult for Warfarin Indication: Mechanical Valve (aortic)  Allergies  Allergen Reactions  . Morphine And Related Other (See Comments)    Combative     Patient Measurements: Height: 6' (182.9 cm) Weight: 293 lb 6.9 oz (133.1 kg) IBW/kg (Calculated) : 77.6   Vital Signs: Temp: 97.5 F (36.4 C) (10/17 0729) Temp Source: Oral (10/17 0729) BP: 135/74 (10/17 0729) Pulse Rate: 93 (10/17 1023)  Labs: Recent Labs    01/27/18 0302 01/28/18 0544 01/28/18 0545 01/29/18 1000  HGB 9.9*  --  10.8* 10.5*  HCT 31.0*  --  35.6* 33.5*  PLT 102*  --  114* 160  LABPROT 17.3*  --  18.2* 19.2*  INR 1.43  --  1.52 1.63  CREATININE 1.72* 1.47*  --  1.36*    Estimated Creatinine Clearance: 74.4 mL/min (A) (by C-G formula based on SCr of 1.36 mg/dL (H)).   Medical History: Past Medical History:  Diagnosis Date  . COPD (chronic obstructive pulmonary disease) ([place])   . Diabetes mellitus without complication ([place])   . Mechanical heart valve present   . Pre-diabetes   . Seizures [facility])     Assessment: 67 yo M restarted on warfarin for mechanical aortic valve. Pt s/p PEA arrest 2/2 hypovolemic/hemorrhagic shock and large retroperitoneal bleed. No bridge due to recent bleeding events. PTA warfarin regimen: 7.5mg  Sun/Th and 5mg  all other days (TWD 40mg ).   INR (1.63) is subtherapeutic but is steadily increasing. CBC stable. No signs of bleeding.   Goal of Therapy:  INR 2-3 per [facility] clinic Monitor platelets by anticoagulation protocol: Yes   Plan:  Warfarin 7.5mg  x1 Monitor CBC and signs of bleeding  Harrietta Guardian, PharmD PGY1 Pharmacy Resident 01/29/2018    10:56 AM

## 2018-01-29 NOTE — Consult Note (Signed)
Consultation Note Date: 01/29/2018   Patient Name: Adam Savage  DOB: [birth year]  MRN: [id number]  Age / Sex: 67 y.o., male  PCP: Patient, [name] Referring Physician: Lucious Groves, DO  Reason for Consultation: Establishing goals of care  HPI/Patient Profile: 67 y.o. male admitted on 01/15/2018 from home with complaints of worsening rectal pain with bleeding for several weeks. He has a past medical history of AVR (coumadin), HFpEF, hepatitis C with vasculitis, diabetes, COPD, epilepsy s/p temporal lobectomy, NSTEMI, diastolic CHF, obesity, OSA, osteoarthritis, and dementia. During ED course patient had areas of confusion but was able to provide some history. Reported he was seen in the clinic the day prior for a follow up visit on his hemorrhoids. He endorsed noticing blood not associated with bowel movements when wiping. He was diagnosed with intertrigo and incontinence secondary to internal hemorrhoids when he was seen by PCP the day prior to admission. He was started on suppository and clotrimazole cream. On work-up hemoglobin 8.1 (10 max 3 weeks prior), Na 133. Glucose 240. BUN 37. Creatinine 1.76. Alk phos 129. Albumin 2.5. WBC 7.3 CT pelvis showed no acute abnormalities. Since admission patient developed an acute upper GI bleed on 10/5 with altered mental status. He suffered a PEA arrest on 10/10 secondary to hypovolemia, hemorraghic shock for large right retroperitoneal hemorrhage, he was resuscitated and sent to ICU. He was later transferred out of ICU. SLP evaluation completed and patient now on dysphagia 2 diet with nectar thick.  He has been seen by neuro and GI both continue to follow. Palliative Medicine consulted for goals of care.   Clinical Assessment and Goals of Care: I have reviewed medical records including lab results, imaging, Epic notes, and MAR, received report from the bedside RN, and  assessed the patient. I then met at the bedside with patient's daughter Caryl Pina [facility])  to discuss diagnosis prognosis, GOC, EOL wishes, disposition and options. Patient is asleep. He is easily aroused but immediately falls back to sleep. He is wearing mitts. Daughter reports progressive worsening of dementia over the past 6 months. Patient is unable to engage appropriately in goals of care discussion with his daughter and I.   I introduced Palliative Medicine as specialized medical care for people living with serious illness. It focuses on providing relief from the symptoms and stress of a serious illness. The goal is to improve quality of life for both the patient and the family.  We discussed a brief life review of the patient. Daughter reports patient is from [place] and recently moved to [facility] to stay with her in June, 2019. Her mother left him last August 2018 due to his behavior issues and increase aggression. 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 He has 2 children. Son lives in [place]. Patient enjoyed sports and NASCAR.   As far as functional and nutritional status daughter reports a significant decline in patient's health over the past 2 years, but a more rapid and noticeable decline over  the past 6 months. She relocated him here to [place] to live with her after discovering he had dementia, he was not eating much, not managing his medication, and finally driving and becoming lost. She received a call from a neighbor letting her know what was going on. Daughter reports patient has been hospitalized at least 7 times in the past 9 months for multiple health conditions. At home he continues to show signs of decline and worsening dementia and behavior. She reports he would go days without proper sleep, would stay away for several hours, sleep for 1-2 hours and repeat the cycle even during the night. He was ambulatory prior to admission with a walker. She reports his  appetite has been good and he actually gained the weight he loss back. She reports he is unable to manage any medications but can assist with ADLs .She reports he continues to have difficulty with his mental state which has worsened since his brain surgery. Often would state his brain needed to be reprogrammed of would have racing and disassociated thoughts.   We discussed his current illness and what it means in the larger context of his on-going co-morbidities.  Natural disease trajectory and expectations at EOL were discussed. Daughter verbalizes her understanding of her father's current condition. She reports he would not want to live this way and quality of life always mattered to him and his family over quantity. She feels that his quality continues to decline and will not get much better.   I attempted to elicit values and goals of care important to the patient and family.   The difference between aggressive medical intervention and comfort care was considered in light of the patient's goals of care. At this time daughter expresses at this time she would like to continue to treat the treatable while hospitalized. She expresses she would not want to put him through procedures or care that will not make much difference in his long-term outcome. She reports he would not want this and she knows the situation would not change. However, she is also not at the point she is ready to shift his care to comfort. She does express her goal for him is to be kept comfortable and not suffer. She feels however, that she needs additional time to see how he does during the rest of his hospital stay and allow him time to show signs of improvement even if it is not at his baseline. She expresses she is not naive and knows that his condition is not the best but yet remains hopeful for improvement but preparing herself and family for the worst.   Daughter expresses her concerns that she is unable to care for her father any  longer and his level of needs has drastically increased. She has small children at home. 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 She voices her goal for him to be placed in a LTAC at discharge if possible.   Advanced directives, concepts specific to code status, artifical feeding and hydration, and rehospitalization were considered and discussed. Caryl Pina reports that her father has advance directives and a copy is on file. She is his designated POA. She also expresses that his wishes and hers would not to have any forms of artificial feedings such  as PEG tube, dialysis, or life-sustaining measures. She confirms his DNR/DNI status.   Hospice and Palliative Care services outpatient were explained and offered. Daughter verbalizes the differences between both outpatient services. She states her goal is for him to be comfortable and if he does pass away for it to be peaceful however, she is not prepared to have hospice involved until she is able to see how he will do before discharge and also have a conversation with her family. She request to have palliative involved in his care while hospitalized and at discharge. She expresses if his condition changes or continues to decline over the next few days or before discharge she would be prepared to shift to hospice at that time. Educated daughter if patient is discharged with palliative and she later feels hospice is more appropriate she can discuss with her outpatient team and they will assist with transitioning care. She verbalized understanding and appreciation.   Questions and concerns were addressed.  Hard Choices booklet left for review. The family was encouraged to call with questions or concerns.  PMT will continue to support  holistically.   Primary Decision Maker:  HCPOA/DAUGHTER: [name]     SUMMARY OF RECOMMENDATIONS    DNR/DNI-as confirmed by daughter  Continue to treat the treatable while hospitalized. Daughter expresses wishes not to escalate care, return to ICU, or undergo invasive procedures.   Daughter expresses goals of care at this time is to watchfully wait. She would like palliative care involved during his hospitalization and also at discharge for support and guidance. She states if patient declines she is prepared to shift care to hospice/comfort focused at that time.   Daughter verbalized difficulty with caring for father in the home given his level of needed care, safety, and her smaller children's safety. She has discussed with medical team and her wishes is for her father to be placed in [place] if possible.   CSW referral placed for outpatient palliative at discharge.   PMT will continue to support patient, family, and medical team during hospitalization.   Code Status/Advance Care Planning:  DNR  Palliative Prophylaxis:   Aspiration, Bowel Regimen, Delirium Protocol, Frequent Pain Assessment, Oral Care and Turn Reposition  Additional Recommendations (Limitations, Scope, Preferences):  Full Scope Treatment, No Artificial Feeding, No Hemodialysis, No Tracheostomy and would not like to be transferred back to ICU in the event of decline. Continue to treat the treatable without escalation of care.   Psycho-social/Spiritual:   Desire for further Chaplaincy support:no  Additional Recommendations: Caregiving  Support/Resources and Education on Hospice  Prognosis:   Unable to determine-Guarded to poor in the setting of mallory-weiss tear, progressive dementia with behavioral disturbance, COPD, diabetes, epilepsy s/p lobectomy, AKI, decreased mobility, poor po intake, dysphagia 2 nectar thick, diastolic CHF, chronic hepatitis C, hemorrhoids, anemia, aortic valve replacement, and  cardiac arrest with successful resuscitation.   Discharge Planning: Daughter is hopeful for LTAC placement with outpatient palliative. If conditin declines prepared to transition to comfort/hospice.       Primary Diagnoses: Present on Admission: . Perianal infection . Mallory-Weiss tear . Dementia associated with other underlying disease with behavioral disturbance ([place]) . AKI (acute kidney injury) ([place])   I have reviewed the medical record, interviewed the patient and family, and examined the patient. The following aspects are pertinent.  Past Medical History:  Diagnosis Date  . COPD (chronic obstructive pulmonary disease) ([place])   . Diabetes mellitus without complication ([place])   . Mechanical heart valve present   .  Pre-diabetes   . Seizures ([place])    Social History   Socioeconomic History  . Marital status: Married    Spouse name: Not on file  . Number of children: Not on file  . Years of education: Not on file  . Highest education level: Not on file  Occupational History  . Not on file  Social Needs  . Financial resource strain: Not on file  . Food insecurity:    Worry: Not on file    Inability: Not on file  . Transportation needs:    Medical: Not on file    Non-medical: Not on file  Tobacco Use  . Smoking status: Current Every Day Smoker    Packs/day: 1.00    Types: Cigarettes  . Smokeless tobacco: Never Used  Substance and Sexual Activity  . Alcohol use: Not Currently  . Drug use: Never  . Sexual activity: Not on file  Lifestyle  . Physical activity:    Days per week: Not on file    Minutes per session: Not on file  . Stress: Not on file  Relationships  . Social connections:    Talks on phone: Not on file    Gets together: Not on file    Attends religious service: Not on file    Active member of club or organization: Not on file    Attends meetings of clubs or organizations: Not on file    Relationship status: Not on file  Other Topics Concern  .  Not on file  Social History Narrative  . Not on file   Family History  Problem Relation Age of Onset  . Hypertension Father    Scheduled Meds: . atorvastatin  10 mg Oral Daily  . carvedilol  3.125 mg Oral BID WC  . clotrimazole  1 application Topical BID  . Gerhardt's butt cream   Topical TID  . insulin aspart  0-20 Units Subcutaneous Q4H  . insulin glargine  20 Units Subcutaneous QHS  . lactulose  20 g Oral Daily  . levETIRAcetam  1,000 mg Oral BID  . mouth rinse  15 mL Mouth Rinse BID  . nicotine  7 mg Transdermal Daily  . pantoprazole  20 mg Oral BID  . QUEtiapine  25 mg Oral BID  . senna-docusate  1 tablet Oral BID  . warfarin  7.5 mg Oral ONCE-1800  . Warfarin - Pharmacist Dosing Inpatient   Does not apply q1800   Continuous Infusions: PRN Meds:.albuterol, fentaNYL (SUBLIMAZE) injection, LORazepam, oxyCODONE, RESOURCE THICKENUP CLEAR, sodium chloride flush Medications Prior to Admission:  Prior to Admission medications   Medication Sig Start Date End Date Taking? Authorizing Provider  acetaminophen (TYLENOL) 650 MG CR tablet Take 1,300 mg by mouth every 8 (eight) hours as needed for pain.   Yes [provider]  albuterol (PROVENTIL HFA;VENTOLIN HFA) 108 (90 Base) MCG/ACT inhaler Inhale 2 puffs into the lungs every 6 (six) hours as needed for wheezing or shortness of breath. 12/25/17  Yes [name], MD  aspirin EC 81 MG tablet Take 81 mg by mouth daily.   Yes [provider]  atorvastatin (LIPITOR) 10 MG tablet Take 1 tablet (10 mg total) by mouth daily. 11/04/17 01/20/18 Yes Arrien, Jimmy Picket, MD  carvedilol (COREG) 6.25 MG tablet Take 1 tablet (6.25 mg total) by mouth 2 (two) times daily with a meal. 11/27/17  Yes Jettie Booze, MD  clotrimazole (LOTRIMIN) 1 % cream Apply 1 application topically 2 (two) times daily. For  2 weeks 01/14/18  Yes Velna Ochs, MD  furosemide (LASIX) 20 MG tablet Take 3 tablets (60 mg total) by mouth daily.  12/25/17 12/25/18 Yes [name], MD  hydrocortisone (ANUSOL-HC) 25 MG suppository Place 1 suppository (25 mg total) rectally 2 (two) times daily as needed for hemorrhoids or anal itching. 01/14/18 01/14/19 Yes Velna Ochs, MD  insulin detemir (LEVEMIR) 100 UNIT/ML injection Inject 12 Units into the skin daily.   Yes [provider]  levETIRAcetam (KEPPRA) 1000 MG tablet Take 1 tablet (1,000 mg total) by mouth 2 (two) times daily. 11/14/17  Yes Cameron Sprang, MD  lisinopril (PRINIVIL,ZESTRIL) 10 MG tablet Take 10 mg by mouth daily.   Yes [provider]  mirtazapine (REMERON) 30 MG tablet Take 30 mg by mouth at bedtime. 12/07/17  Yes [provider]  ramipril (ALTACE) 5 MG capsule Take 1 capsule (5 mg total) by mouth daily. 11/27/17  Yes Jettie Booze, MD  ranitidine (ZANTAC) 150 MG tablet Take 150 mg by mouth 2 (two) times daily.  05/01/17  Yes [provider]  tamsulosin (FLOMAX) 0.4 MG CAPS capsule Take 0.4 mg by mouth at bedtime.  05/01/17  Yes [provider]  traZODone (DESYREL) 100 MG tablet Take 200 mg by mouth at bedtime. 10/15/17  Yes [provider]  warfarin (COUMADIN) 5 MG tablet Take 5 mg by mouth See admin instructions. Take one tablet (5 mg) by mouth Monday, Tuesday, Wednesday, Friday, Saturday nights - 9pm 05/02/17  Yes [provider]  warfarin (COUMADIN) 7.5 MG tablet Take 7.5 mg by mouth See admin instructions. Take one tablet (7.5 mg) by mouth on Sunday and Thursday nights - 9pm (take 5 mg tablet on all other days of the week) 05/01/17  Yes [provider]  blood glucose meter kit and supplies Dispense based on patient and insurance preference. Use up to four times daily as directed. (FOR ICD-10 E10.9, E11.9). 11/04/17   Arrien, Jimmy Picket, MD  glucose blood test strip Use as instructed 01/13/18   [name], MD  Lancets MISC 1 Units by Does not apply route 4 (four) times daily -  with meals and at  bedtime. 01/13/18   [name], MD   Allergies  Allergen Reactions  . Morphine And Related Other (See Comments)    Combative    Review of Systems  Unable to perform ROS: Dementia    Physical Exam  Constitutional: Vital signs are normal. He is sleeping. He appears ill.  Chronically ill appearing   Cardiovascular: Normal rate, regular rhythm, normal heart sounds and normal pulses.  Pulmonary/Chest: Effort normal. He has decreased breath sounds.  Abdominal: Soft. Normal appearance and bowel sounds are normal.  Obese   Genitourinary: Right testis shows swelling. Left testis shows swelling.  Genitourinary Comments: Redness to scrotum   Musculoskeletal:  weakness  Neurological: He is disoriented.  Sleeping, would awake but falls back to sleep hx of dementia with behavior, wearing mitts   Skin: Skin is warm, dry and intact. Bruising noted.  Psychiatric: Cognition and memory are impaired. He expresses inappropriate judgment.  Hx of dementia   Nursing note and vitals reviewed.   Vital Signs: BP 135/74 (BP Location: Right Arm)   Pulse 93   Temp (!) 97.5 F (36.4 C) (Oral)   Resp 18   Ht 6' (1.829 m)   Wt 133.1 kg   SpO2 98%   BMI 39.80 kg/m  Pain Scale: Faces POSS *See Group Information*:  1-Acceptable,Awake and alert Pain Score: Asleep   SpO2: SpO2: 98 % O2 Device:SpO2: 98 % O2 Flow Rate: .O2 Flow Rate (L/min): 2 L/min  IO: Intake/output summary:   Intake/Output Summary (Last 24 hours) at 01/29/2018 1252 Last data filed at 01/29/2018 0900 Gross per 24 hour  Intake 840 ml  Output 350 ml  Net 490 ml    LBM: Last BM Date: 01/28/18 Baseline Weight: Weight: 125.6 kg Most recent weight: Weight: 133.1 kg     Palliative Assessment/Data: PPS 30%   Time In: 0945 Time Out: 1115 Time Total: 90 min.  Greater than 50%  of this time was spent counseling and coordinating care related to the above assessment and plan.  Signed by:  Alda Lea,  AGPCNP-BC Palliative Medicine Team  Phone: [phone] Fax: [phone] Pager: [phone] Amion: Bjorn Pippin    Please contact Palliative Medicine Team phone at [phone] for questions and concerns.  For individual provider: See Shea Evans

## 2018-01-29 NOTE — Care Management Important Message (Signed)
Important Message  Patient Details  Name: Adam Savage MRN: [id number] Date of Birth: Dec 11, 1950   Medicare Important Message Given:  Yes    Yusif Gnau P [place] 01/29/2018, 11:48 AM

## 2018-01-30 ENCOUNTER — Inpatient Hospital Stay (HOSPITAL_COMMUNITY): Payer: Medicare Other

## 2018-01-30 DIAGNOSIS — M2550 Pain in unspecified joint: Secondary | ICD-10-CM | POA: Diagnosis not present

## 2018-01-30 DIAGNOSIS — M940 Chondrocostal junction syndrome [Tietze]: Secondary | ICD-10-CM | POA: Diagnosis not present

## 2018-01-30 DIAGNOSIS — D500 Iron deficiency anemia secondary to blood loss (chronic): Secondary | ICD-10-CM | POA: Diagnosis not present

## 2018-01-30 DIAGNOSIS — E7800 Pure hypercholesterolemia, unspecified: Secondary | ICD-10-CM | POA: Diagnosis not present

## 2018-01-30 DIAGNOSIS — E785 Hyperlipidemia, unspecified: Secondary | ICD-10-CM | POA: Diagnosis not present

## 2018-01-30 DIAGNOSIS — R1311 Dysphagia, oral phase: Secondary | ICD-10-CM | POA: Diagnosis not present

## 2018-01-30 DIAGNOSIS — Z7401 Bed confinement status: Secondary | ICD-10-CM | POA: Diagnosis not present

## 2018-01-30 DIAGNOSIS — I110 Hypertensive heart disease with heart failure: Secondary | ICD-10-CM | POA: Diagnosis present

## 2018-01-30 DIAGNOSIS — F05 Delirium due to known physiological condition: Secondary | ICD-10-CM | POA: Diagnosis not present

## 2018-01-30 DIAGNOSIS — N179 Acute kidney failure, unspecified: Secondary | ICD-10-CM | POA: Diagnosis not present

## 2018-01-30 DIAGNOSIS — F339 Major depressive disorder, recurrent, unspecified: Secondary | ICD-10-CM | POA: Diagnosis not present

## 2018-01-30 DIAGNOSIS — F0280 Dementia in other diseases classified elsewhere without behavioral disturbance: Secondary | ICD-10-CM | POA: Diagnosis not present

## 2018-01-30 DIAGNOSIS — R413 Other amnesia: Secondary | ICD-10-CM | POA: Diagnosis not present

## 2018-01-30 DIAGNOSIS — R34 Anuria and oliguria: Secondary | ICD-10-CM

## 2018-01-30 DIAGNOSIS — E8809 Other disorders of plasma-protein metabolism, not elsewhere classified: Secondary | ICD-10-CM | POA: Diagnosis not present

## 2018-01-30 DIAGNOSIS — F0281 Dementia in other diseases classified elsewhere with behavioral disturbance: Secondary | ICD-10-CM | POA: Diagnosis not present

## 2018-01-30 DIAGNOSIS — R451 Restlessness and agitation: Secondary | ICD-10-CM | POA: Diagnosis present

## 2018-01-30 DIAGNOSIS — I38 Endocarditis, valve unspecified: Secondary | ICD-10-CM | POA: Diagnosis not present

## 2018-01-30 DIAGNOSIS — N390 Urinary tract infection, site not specified: Secondary | ICD-10-CM | POA: Diagnosis not present

## 2018-01-30 DIAGNOSIS — G4089 Other seizures: Secondary | ICD-10-CM | POA: Diagnosis not present

## 2018-01-30 DIAGNOSIS — F0631 Mood disorder due to known physiological condition with depressive features: Secondary | ICD-10-CM | POA: Diagnosis present

## 2018-01-30 DIAGNOSIS — G3109 Other frontotemporal dementia: Secondary | ICD-10-CM | POA: Diagnosis not present

## 2018-01-30 DIAGNOSIS — Z8674 Personal history of sudden cardiac arrest: Secondary | ICD-10-CM

## 2018-01-30 DIAGNOSIS — J9690 Respiratory failure, unspecified, unspecified whether with hypoxia or hypercapnia: Secondary | ICD-10-CM | POA: Diagnosis not present

## 2018-01-30 DIAGNOSIS — I509 Heart failure, unspecified: Secondary | ICD-10-CM | POA: Diagnosis not present

## 2018-01-30 DIAGNOSIS — E7849 Other hyperlipidemia: Secondary | ICD-10-CM | POA: Diagnosis not present

## 2018-01-30 DIAGNOSIS — I10 Essential (primary) hypertension: Secondary | ICD-10-CM | POA: Diagnosis not present

## 2018-01-30 DIAGNOSIS — R339 Retention of urine, unspecified: Secondary | ICD-10-CM | POA: Diagnosis not present

## 2018-01-30 DIAGNOSIS — G9341 Metabolic encephalopathy: Secondary | ICD-10-CM | POA: Diagnosis not present

## 2018-01-30 DIAGNOSIS — M6281 Muscle weakness (generalized): Secondary | ICD-10-CM | POA: Diagnosis not present

## 2018-01-30 DIAGNOSIS — I5032 Chronic diastolic (congestive) heart failure: Secondary | ICD-10-CM | POA: Diagnosis not present

## 2018-01-30 DIAGNOSIS — G9340 Encephalopathy, unspecified: Secondary | ICD-10-CM | POA: Diagnosis not present

## 2018-01-30 DIAGNOSIS — R58 Hemorrhage, not elsewhere classified: Secondary | ICD-10-CM | POA: Diagnosis not present

## 2018-01-30 DIAGNOSIS — D649 Anemia, unspecified: Secondary | ICD-10-CM | POA: Diagnosis present

## 2018-01-30 DIAGNOSIS — L304 Erythema intertrigo: Secondary | ICD-10-CM | POA: Diagnosis not present

## 2018-01-30 DIAGNOSIS — K922 Gastrointestinal hemorrhage, unspecified: Secondary | ICD-10-CM | POA: Diagnosis not present

## 2018-01-30 DIAGNOSIS — B182 Chronic viral hepatitis C: Secondary | ICD-10-CM | POA: Diagnosis not present

## 2018-01-30 DIAGNOSIS — I469 Cardiac arrest, cause unspecified: Secondary | ICD-10-CM | POA: Diagnosis not present

## 2018-01-30 DIAGNOSIS — I5030 Unspecified diastolic (congestive) heart failure: Secondary | ICD-10-CM

## 2018-01-30 DIAGNOSIS — Z885 Allergy status to narcotic agent status: Secondary | ICD-10-CM

## 2018-01-30 DIAGNOSIS — Z8719 Personal history of other diseases of the digestive system: Secondary | ICD-10-CM

## 2018-01-30 DIAGNOSIS — D62 Acute posthemorrhagic anemia: Secondary | ICD-10-CM | POA: Diagnosis not present

## 2018-01-30 DIAGNOSIS — Z952 Presence of prosthetic heart valve: Secondary | ICD-10-CM | POA: Diagnosis not present

## 2018-01-30 DIAGNOSIS — K209 Esophagitis, unspecified: Secondary | ICD-10-CM | POA: Diagnosis present

## 2018-01-30 DIAGNOSIS — G9389 Other specified disorders of brain: Secondary | ICD-10-CM | POA: Diagnosis not present

## 2018-01-30 DIAGNOSIS — G3184 Mild cognitive impairment, so stated: Secondary | ICD-10-CM | POA: Diagnosis present

## 2018-01-30 DIAGNOSIS — K226 Gastro-esophageal laceration-hemorrhage syndrome: Secondary | ICD-10-CM | POA: Diagnosis not present

## 2018-01-30 DIAGNOSIS — K648 Other hemorrhoids: Secondary | ICD-10-CM | POA: Diagnosis not present

## 2018-01-30 DIAGNOSIS — J9621 Acute and chronic respiratory failure with hypoxia: Secondary | ICD-10-CM | POA: Diagnosis not present

## 2018-01-30 DIAGNOSIS — I2510 Atherosclerotic heart disease of native coronary artery without angina pectoris: Secondary | ICD-10-CM | POA: Diagnosis present

## 2018-01-30 DIAGNOSIS — F0391 Unspecified dementia with behavioral disturbance: Secondary | ICD-10-CM | POA: Diagnosis present

## 2018-01-30 DIAGNOSIS — B9620 Unspecified Escherichia coli [E. coli] as the cause of diseases classified elsewhere: Secondary | ICD-10-CM | POA: Diagnosis not present

## 2018-01-30 DIAGNOSIS — J811 Chronic pulmonary edema: Secondary | ICD-10-CM | POA: Diagnosis not present

## 2018-01-30 DIAGNOSIS — R4182 Altered mental status, unspecified: Secondary | ICD-10-CM | POA: Diagnosis not present

## 2018-01-30 DIAGNOSIS — Z9911 Dependence on respirator [ventilator] status: Secondary | ICD-10-CM | POA: Diagnosis not present

## 2018-01-30 DIAGNOSIS — G4733 Obstructive sleep apnea (adult) (pediatric): Secondary | ICD-10-CM | POA: Diagnosis not present

## 2018-01-30 DIAGNOSIS — R4189 Other symptoms and signs involving cognitive functions and awareness: Secondary | ICD-10-CM | POA: Diagnosis not present

## 2018-01-30 DIAGNOSIS — K661 Hemoperitoneum: Secondary | ICD-10-CM | POA: Diagnosis not present

## 2018-01-30 DIAGNOSIS — R2689 Other abnormalities of gait and mobility: Secondary | ICD-10-CM | POA: Diagnosis not present

## 2018-01-30 DIAGNOSIS — Z7189 Other specified counseling: Secondary | ICD-10-CM | POA: Diagnosis not present

## 2018-01-30 DIAGNOSIS — J449 Chronic obstructive pulmonary disease, unspecified: Secondary | ICD-10-CM | POA: Diagnosis not present

## 2018-01-30 DIAGNOSIS — E119 Type 2 diabetes mellitus without complications: Secondary | ICD-10-CM | POA: Diagnosis not present

## 2018-01-30 DIAGNOSIS — R071 Chest pain on breathing: Secondary | ICD-10-CM | POA: Diagnosis not present

## 2018-01-30 DIAGNOSIS — R1312 Dysphagia, oropharyngeal phase: Secondary | ICD-10-CM | POA: Diagnosis present

## 2018-01-30 DIAGNOSIS — R5381 Other malaise: Secondary | ICD-10-CM | POA: Diagnosis not present

## 2018-01-30 DIAGNOSIS — G40909 Epilepsy, unspecified, not intractable, without status epilepticus: Secondary | ICD-10-CM | POA: Diagnosis not present

## 2018-01-30 DIAGNOSIS — Z9889 Other specified postprocedural states: Secondary | ICD-10-CM | POA: Diagnosis not present

## 2018-01-30 DIAGNOSIS — I959 Hypotension, unspecified: Secondary | ICD-10-CM | POA: Diagnosis not present

## 2018-01-30 DIAGNOSIS — K219 Gastro-esophageal reflux disease without esophagitis: Secondary | ICD-10-CM | POA: Diagnosis not present

## 2018-01-30 LAB — RENAL FUNCTION PANEL
Albumin: 1.9 g/dL — ABNORMAL LOW (ref 3.5–5.0)
Anion gap: 7 (ref 5–15)
BUN: 28 mg/dL — ABNORMAL HIGH (ref 8–23)
CO2: 25 mmol/L (ref 22–32)
Calcium: 8.3 mg/dL — ABNORMAL LOW (ref 8.9–10.3)
Chloride: 109 mmol/L (ref 98–111)
Creatinine, Ser: 1.2 mg/dL (ref 0.61–1.24)
GFR calc Af Amer: >60 mL/min (ref >60)
GFR calc non Af Amer: >60 mL/min (ref >60)
Glucose, Bld: 94 mg/dL (ref 70–99)
Phosphorus: 3.2 mg/dL (ref 2.5–4.6)
Potassium: 3.6 mmol/L (ref 3.5–5.1)
Sodium: 141 mmol/L (ref 135–145)

## 2018-01-30 LAB — GLUCOSE, CAPILLARY
Glucose-Capillary: 84 mg/dL (ref 70–99)
Glucose-Capillary: 92 mg/dL (ref 70–99)
Glucose-Capillary: 127 mg/dL — ABNORMAL HIGH (ref 70–99)
Glucose-Capillary: 155 mg/dL — ABNORMAL HIGH (ref 70–99)

## 2018-01-30 LAB — PROTIME-INR
INR: 1.59
Prothrombin Time: 18.8 seconds — ABNORMAL HIGH (ref 11.4–15.2)

## 2018-01-30 LAB — CBC
HCT: 35.2 % — ABNORMAL LOW (ref 39.0–52.0)
Hemoglobin: 10.7 g/dL — ABNORMAL LOW (ref 13.0–17.0)
MCH: 26.8 pg (ref 26.0–34.0)
MCHC: 30.4 g/dL (ref 30.0–36.0)
MCV: 88 fL (ref 80.0–100.0)
Platelets: 166 10*3/uL (ref 150–400)
RBC: 4 MIL/uL — ABNORMAL LOW (ref 4.22–5.81)
RDW: 17.9 % — ABNORMAL HIGH (ref 11.5–15.5)
WBC: 11.5 10*3/uL — ABNORMAL HIGH (ref 4.0–10.5)
nRBC: 0 % (ref 0.0–0.2)

## 2018-01-30 MED ORDER — SENNOSIDES-DOCUSATE SODIUM 8.6-50 MG PO TABS: 1.0000 | ORAL_TABLET | Freq: Two times a day (BID) | ORAL | Status: DC

## 2018-01-30 MED ORDER — LORAZEPAM 2 MG/ML IJ SOLN
1.0000 mg | Freq: Once | INTRAMUSCULAR | Status: AC | PRN
  Administered 2018-01-30: 1 mg via INTRAVENOUS
  Filled 2018-01-30: qty 1

## 2018-01-30 MED ORDER — LACTULOSE 10 GM/15ML PO SOLN: 20.0000 g | Freq: Every day | ORAL | 0 refills | Status: DC

## 2018-01-30 MED ORDER — LEVETIRACETAM 100 MG/ML PO SOLN: 1000.0000 mg | Freq: Two times a day (BID) | ORAL | 12 refills | Status: DC

## 2018-01-30 MED ORDER — WARFARIN SODIUM 7.5 MG PO TABS
7.5000 mg | ORAL_TABLET | Freq: Once | ORAL | Status: AC
  Administered 2018-01-30: 7.5 mg via ORAL
  Filled 2018-01-30: qty 1

## 2018-01-30 MED ORDER — RESOURCE THICKENUP CLEAR PO POWD: 1.0000 | ORAL | Status: DC | PRN

## 2018-01-30 MED ORDER — LORAZEPAM 2 MG/ML IJ SOLN: 0.5000 mg | INTRAMUSCULAR | 0 refills | Status: DC | PRN

## 2018-01-30 MED ORDER — NICOTINE 7 MG/24HR TD PT24: 7.0000 mg | MEDICATED_PATCH | Freq: Every day | TRANSDERMAL | 0 refills | Status: DC

## 2018-01-30 MED ORDER — CARVEDILOL 3.125 MG PO TABS: 3.1250 mg | ORAL_TABLET | Freq: Two times a day (BID) | ORAL | Status: DC

## 2018-01-30 MED ORDER — ORAL CARE MOUTH RINSE: 15.0000 mL | Freq: Two times a day (BID) | OROMUCOSAL | 0 refills | Status: DC

## 2018-01-30 MED ORDER — CLOTRIMAZOLE 1 % EX CREA: 1.0000 "application " | TOPICAL_CREAM | Freq: Two times a day (BID) | CUTANEOUS | 0 refills | Status: DC

## 2018-01-30 MED ORDER — WARFARIN SODIUM 7.5 MG PO TABS: 7.5000 mg | ORAL_TABLET | Freq: Every day | ORAL | Status: DC

## 2018-01-30 MED ORDER — QUETIAPINE FUMARATE 25 MG PO TABS: 25.0000 mg | ORAL_TABLET | Freq: Two times a day (BID) | ORAL | Status: DC

## 2018-01-30 MED ORDER — GERHARDT'S BUTT CREAM: 1.0000 "application " | TOPICAL_CREAM | Freq: Three times a day (TID) | CUTANEOUS | Status: DC

## 2018-01-30 MED ORDER — PRO-STAT SUGAR FREE PO LIQD
30.0000 mL | Freq: Two times a day (BID) | ORAL | Status: DC
  Administered 2018-01-30 (×2): 30 mL via ORAL
  Filled 2018-01-30 (×2): qty 30

## 2018-01-30 MED ORDER — INSULIN GLARGINE 100 UNIT/ML ~~LOC~~ SOLN: 20.0000 [IU] | Freq: Every day | SUBCUTANEOUS | 11 refills | Status: DC

## 2018-01-30 MED ORDER — ALBUTEROL SULFATE (2.5 MG/3ML) 0.083% IN NEBU: 2.5000 mg | INHALATION_SOLUTION | Freq: Four times a day (QID) | RESPIRATORY_TRACT | 12 refills | Status: DC | PRN

## 2018-01-30 MED ORDER — PANTOPRAZOLE SODIUM 40 MG PO TBEC: 40.0000 mg | DELAYED_RELEASE_TABLET | Freq: Every day | ORAL | Status: DC

## 2018-01-30 MED ORDER — PANTOPRAZOLE SODIUM 40 MG PO TBEC
40.0000 mg | DELAYED_RELEASE_TABLET | Freq: Every day | ORAL | Status: DC
  Administered 2018-01-30: 40 mg via ORAL
  Filled 2018-01-30: qty 1

## 2018-01-30 MED ORDER — PRO-STAT SUGAR FREE PO LIQD: 30.0000 mL | Freq: Two times a day (BID) | ORAL | 0 refills | Status: DC

## 2018-01-30 NOTE — Progress Notes (Signed)
Internal Medicine Attending:   I saw and examined the patient. I reviewed Dr Darcey Nora note and I agree with the resident's findings and plan as documented in the resident's note. Patient remains disoriented, no acute pain, heart RRR mech click, lungs clear anteriorly, 1+ pedal edema. We will attempt to obtain MRI of brain to complete workup for rapidly progressive dementia as requested by neurology> this was obtained and does not show any reversible cause of his dementia, I expect he will need a prolonged recovery and he will be transferred to [place] today in agreement with his daughter.

## 2018-01-30 NOTE — Progress Notes (Signed)
Daily Progress Note   Patient Name: Adam Savage       Date: 01/30/2018 DOB: 1950/09/18  Age: 67 y.o. MRN#: [id number] Attending Physician: Lucious Groves, DO Primary Care Physician: Patient, [name] Admit Date: 01/15/2018  Reason for Consultation/Follow-up: Establishing goals of care  Subjective: Patient more awake and alert today. He remains disoriented. He is alert to daughter and self. Able to correctly identify his name, daughter's name and that she is his daughter. Continues to wear mitts to prevent pulling of lines. Complains of back and sternum pain with movement. He does follow some commands. Will respond to questions inappropriately with no association. Also randomly holding discussion such as "redecorating the room, and trying to get out of the box".  Bedside RN reports patient refused to eat breakfast despite multiple attempts.   Daughter is at the bedside. Briefly reviewed goals of care discussion yesterday. She verbalized goal is for patient to go to [facility] if possible. Reports she has spoken with admission nurse at [facility] who states they were waiting on final decisions. Discussed with daughter CSW would continue to update her on the progress and medical team will make sure patient is discharged once medically stable. Daughter states after giving it some thought and discussing with family she would like to proceed with outpatient palliative at facility versus hospice. She is aware if patient shows no signs of improvement of declines patient can transition to hospice care.   Chart Reviewed and report received from bedside RN.   Length of Stay: 13  Current Medications: Scheduled Meds:  . atorvastatin  10 mg Oral Daily  . carvedilol  3.125 mg Oral BID WC  . clotrimazole  1  application Topical BID  . feeding supplement (PRO-STAT SUGAR FREE 64)  30 mL Oral BID  . Gerhardt's butt cream   Topical TID  . insulin aspart  0-20 Units Subcutaneous Q4H  . insulin glargine  20 Units Subcutaneous QHS  . lactulose  20 g Oral Daily  . levETIRAcetam  1,000 mg Oral BID  . mouth rinse  15 mL Mouth Rinse BID  . nicotine  7 mg Transdermal Daily  . pantoprazole  40 mg Oral Daily  . QUEtiapine  25 mg Oral BID  . senna-docusate  1 tablet Oral BID  . Warfarin - Pharmacist Dosing  Inpatient   Does not apply q1800    Continuous Infusions:   PRN Meds: albuterol, fentaNYL (SUBLIMAZE) injection, LORazepam, oxyCODONE, RESOURCE THICKENUP CLEAR, sodium chloride flush  Physical Exam  Constitutional: Vital signs are normal. He is uncooperative. He appears ill.  Chronically ill   Cardiovascular: Normal rate, regular rhythm, normal heart sounds and normal pulses.  Pulmonary/Chest: Effort normal. He has decreased breath sounds.  Abdominal: Soft. Normal appearance and bowel sounds are normal.  Genitourinary: Penile erythema and penile tenderness present.  Genitourinary Comments: Condom cath, incontinence , scrotal edema/redness   Neurological: He is alert. He is disoriented.  Alert to daughter, will talk but no association to questions answered or situation   Skin: Skin is warm and dry. Bruising and petechiae noted.  Psychiatric: His mood appears anxious. Cognition and memory are impaired. He expresses inappropriate judgment.  Nursing note and vitals reviewed.           Vital Signs: BP 116/64 (BP Location: Right Arm)   Pulse 85   Temp 97.8 F (36.6 C) (Oral)   Resp (!) 21   Ht 6' (1.829 m)   Wt 133.1 kg   SpO2 100%   BMI 39.80 kg/m  SpO2: SpO2: 100 % O2 Device: O2 Device: Room Air O2 Flow Rate: O2 Flow Rate (L/min): 2 L/min  Intake/output summary:   Intake/Output Summary (Last 24 hours) at 01/30/2018 1051 Last data filed at 01/29/2018 1852 Gross per 24 hour  Intake  960 ml  Output -  Net 960 ml   LBM: Last BM Date: 01/28/18 Baseline Weight: Weight: 125.6 kg Most recent weight: Weight: 133.1 kg      Palliative Assessment/Data: PPS 30%   Patient Active Problem List   Diagnosis Date Noted  . Cardiac arrest with successful resuscitation ([place]) 01/29/2018  . Retroperitoneal hematoma 01/29/2018  . AKI (acute kidney injury) ([place]) 01/22/2018  . Dementia associated with other underlying disease with behavioral disturbance ([place])   . Acute metabolic encephalopathy   . S/P AVR (aortic valve replacement)   . Mallory-Weiss tear 01/17/2018  . Acute blood loss anemia   . Perianal infection 01/15/2018  . Diarrhea 01/15/2018  . Hypotension 01/15/2018  . Internal hemorrhoid, bleeding 01/14/2018  . Intertrigo 01/14/2018  . Chronic hepatitis C without hepatic coma ([place]) 01/12/2018  . 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 Leg swelling 12/22/2017  . Encounter for therapeutic drug monitoring 11/06/2017  . Non-ST elevation (NSTEMI) myocardial infarction ([place])   . Chronic anticoagulation   . Unstable angina ([place]) 10/29/2017  . Chest pain 10/28/2017  . Seizure disorder ([place])   . COPD (chronic obstructive pulmonary disease) ([place])   . Pre-diabetes   . Overdose of muscle relaxant 08/11/2017  . Folic acid deficiency anemia 06/20/2016  . Obesity 09/21/2015  . Fatty liver disease, nonalcoholic 42/70/6237  . Elevated liver enzymes 07/28/2015  . Osteoarthritis 07/28/2015  . OSA (obstructive sleep apnea) 05/05/2015  . Joint pain 11/12/2013  . Tension headache, chronic 08/25/2013  . Status post lobectomy of brain 06/28/2013  . Epilepsy ([place]) 11/03/2012  . H/O diverticulitis of colon 11/03/2012  . Mechanical heart valve present 09/05/2012    Palliative Care Assessment & Plan   Patient Profile: 67 y.o. male admitted on 01/15/2018 from home with complaints of worsening rectal pain with bleeding for several weeks. He has a past medical history of  AVR (coumadin), HFpEF, hepatitis C with vasculitis, diabetes, COPD, epilepsy s/p temporal lobectomy, NSTEMI, diastolic CHF, obesity, OSA, osteoarthritis, and dementia.  During ED course patient had areas of confusion but was able to provide some history. Reported he was seen in the clinic the day prior for a follow up visit on his hemorrhoids. He endorsed noticing blood not associated with bowel movements when wiping. He was diagnosed with intertrigo and incontinence secondary to internal hemorrhoids when he was seen by PCP the day prior to admission. He was started on suppository and clotrimazole cream. On work-up hemoglobin 8.1 (10 max 3 weeks prior), Na 133. Glucose 240. BUN 37. Creatinine 1.76. Alk phos 129. Albumin 2.5. WBC 7.3 CT pelvis showed no acute abnormalities. Since admission patient developed an acute upper GI bleed on 10/5 with altered mental status. He suffered a PEA arrest on 10/10 secondary to hypovolemia, hemorraghic shock for large right retroperitoneal hemorrhage, he was resuscitated and sent to ICU. He was later transferred out of ICU. SLP evaluation completed and patient now on dysphagia 2 diet with nectar thick.  He has been seen by neuro and GI both continue to follow. Palliative Medicine consulted for goals of care.   Recommendations/Plan:  DNR/DNI  Continue to treat the treatable  Daughter express she is hopeful patient can be placed at [facility] with outpatient palliative.   CSW aware of needs.   PMT will continue to support as needed.   Goals of Care and Additional Recommendations:  Limitations on Scope of Treatment: Full Scope Treatment  Code Status:    Code Status Orders  (From admission, onward)         Start     Ordered   01/27/18 1253  Do not attempt resuscitation (DNR)  Continuous    Question Answer Comment  In the event of cardiac or respiratory ARREST Do not call a "code blue"   In the event of cardiac or respiratory ARREST Do not perform Intubation, CPR,  defibrillation or ACLS   In the event of cardiac or respiratory ARREST Use medication by any route, position, wound care, and other measures to relive pain and suffering. May use oxygen, suction and manual treatment of airway obstruction as needed for comfort.      01/27/18 1252        Code Status History    Date Active Date Inactive Code Status Order ID Comments User Context   01/15/2018 1653 01/27/2018 1252 Full Code [id number]  Ina Homes, MD ED   12/22/2017 0145 12/27/2017 2132 Full Code [id number]  Welford Roche, MD ED   10/28/2017 2024 11/04/2017 2103 Full Code [id number]  Phillips Grout, MD Inpatient    Advance Directive Documentation     Most Recent Value  Type of Advance Directive  Healthcare Power of Attorney  Pre-existing out of facility DNR order (yellow form or pink MOST form)  -  "MOST" Form in Place?  -       Prognosis:   Unable to determine Guarded to poor in the setting of mallory-weiss tear, progressive dementia with behavioral disturbance, COPD, diabetes, epilepsy s/p lobectomy, AKI, decreased mobility, poor po intake, dysphagia 2 nectar thick, diastolic CHF, chronic hepatitis C, hemorrhoids, anemia, aortic valve replacement, and cardiac arrest with successful resuscitation.   Discharge Planning:  Daughter is hopeful for LTAC placement with outpatient palliative. If condition declines prepared to transition to comfort/hospice  Care plan was discussed with patient's daughter and bedside, RN.   Thank you for allowing the Palliative Medicine Team to assist in the care of this patient.   Total Time 45 min.  Prolonged Time Billed  NO  Greater than 50%  of this time was spent counseling and coordinating care related to the above assessment and plan.  Alda Lea, AGPCNP-BC Palliative Medicine Team  Pager: [phone] Amion: Bjorn Pippin   Please contact Palliative Medicine Team phone at [phone] for questions and concerns.

## 2018-01-30 NOTE — Progress Notes (Signed)
Called [facility] to set up transport and they will be here in 30-45 minutes per report. Adam Savage with [facility] and let her know of patient arrival time and gave report to Hubbard at [place]. Notified patient's daughter Adam Savage who states she agrees with transfer and is thankful for assistance.

## 2018-01-30 NOTE — Progress Notes (Signed)
RT NOTE:  Pt refuses CPAP tonight. RT available if patient changes mind. 

## 2018-01-30 NOTE — Progress Notes (Signed)
Pt was transferred to [facility] at 09:33  pm by [facility]. Report given to RN Raquel Sarna at [facility] by Seth Bake, RN day shift.  All of AVS put in envelope given to [facility] staff.  All Pt belongings given back to Pt. Pt had left via stretcher. Vital signs stable at leaving.  Eliz Nigg. RN

## 2018-01-30 NOTE — Progress Notes (Addendum)
ANTICOAGULATION CONSULT NOTE - Initial Consult  Pharmacy Consult for Warfarin Indication: Mechanical Valve (aortic)  Allergies  Allergen Reactions  . Morphine And Related Other (See Comments)    Combative     Patient Measurements: Height: 6' (182.9 cm) Weight: 293 lb 6.9 oz (133.1 kg) IBW/kg (Calculated) : 77.6   Vital Signs: Temp: 97.8 F (36.6 C) (10/18 0401) Temp Source: Oral (10/18 0401) BP: 116/64 (10/18 0401) Pulse Rate: 85 (10/18 0401)  Labs: Recent Labs    01/28/18 0544  01/28/18 0545 01/29/18 1000 01/30/18 0346  HGB  --    < > 10.8* 10.5* 10.7*  HCT  --   --  35.6* 33.5* 35.2*  PLT  --   --  114* 160 166  LABPROT  --   --  18.2* 19.2* 18.8*  INR  --   --  1.52 1.63 1.59  CREATININE 1.47*  --   --  1.36* 1.20   < > = values in this interval not displayed.    Estimated Creatinine Clearance: 84.3 mL/min (by C-G formula based on SCr of 1.2 mg/dL).   Medical History: Past Medical History:  Diagnosis Date  . COPD (chronic obstructive pulmonary disease) ([place])   . Diabetes mellitus without complication ([place])   . Mechanical heart valve present   . Pre-diabetes   . Seizures [facility])     Assessment: 67 yo M restarted on warfarin for mechanical aortic valve. Pt s/p PEA arrest 2/2 hypovolemic/hemorrhagic shock and large retroperitoneal bleed. No bridge due to recent bleeding events. PTA warfarin regimen: 7.5mg  Sun/Th and 5mg  all other days (TWD 40mg ).   INR (1.59) is subtherapeutic but is slowly increasing. CBC stable. No signs of bleeding. If INR does not appropriately increase tomorrow, would consider increasing dose to 10mg .  Goal of Therapy:  INR 2-3 per [facility] clinic Monitor platelets by anticoagulation protocol: Yes   Plan:  Warfarin 7.5mg  x1 Monitor CBC and signs of bleeding  Harrietta Guardian, PharmD PGY1 Pharmacy Resident 01/30/2018    1:43 PM

## 2018-01-30 NOTE — Progress Notes (Signed)
CM has spoken with Raquel Sarna liaison at [facility] and clarified admission appropriateness for pt to admit to [facility] have also discussed with Dr. Rockne Menghini who is ok with pt going to [facility]- pt meets non-traditional LTACH criteria that is within admission guidelines under Medicare. Spoke with pt's daughter Caryl Pina who prefers admission to [facility] at this time for continued treatment of acute needs for encephalopathy and progressive dementia. Daughter would like Palliative Care to follow at [facility] which as been relayed to [place]. Pt does have bed available for transition today- rounding team has been notified and will plan to d/c today.  Room assignment at [facility] - room 9 Accepting MD- Dr. Garwin Brothers # for RN to call report- [phone]

## 2018-01-30 NOTE — Discharge Summary (Signed)
Name: Adam Savage MRN: [id number] DOB: 1950/09/14 67 y.o. PCP: Patient, [name]  Date of Admission: 01/15/2018 10:31 AM Date of Discharge:  Attending Physician: Lucious Groves, DO  Discharge Diagnosis: 1. Intertrigo, GI bleeding, retroperitoneal bleeding, progressive dementia related to previous temporal lobectomy  Discharge Medications: Allergies as of 01/30/2018      Reactions   Morphine And Related Other (See Comments)   Combative      Medication List    STOP taking these medications   acetaminophen 650 MG CR tablet Commonly known as:  TYLENOL   albuterol 108 (90 Base) MCG/ACT inhaler Commonly known as:  PROVENTIL HFA;VENTOLIN HFA Replaced by:  albuterol (2.5 MG/3ML) 0.083% nebulizer solution   aspirin EC 81 MG tablet   blood glucose meter kit and supplies   furosemide 20 MG tablet Commonly known as:  LASIX   glucose blood test strip   hydrocortisone 25 MG suppository Commonly known as:  ANUSOL-HC   insulin detemir 100 UNIT/ML injection Commonly known as:  LEVEMIR   Lancets Misc   levETIRAcetam 1000 MG tablet Commonly known as:  KEPPRA Replaced by:  levETIRAcetam 100 MG/ML solution   lisinopril 10 MG tablet Commonly known as:  PRINIVIL,ZESTRIL   mirtazapine 30 MG tablet Commonly known as:  REMERON   ramipril 5 MG capsule Commonly known as:  ALTACE   ranitidine 150 MG tablet Commonly known as:  ZANTAC   tamsulosin 0.4 MG Caps capsule Commonly known as:  FLOMAX   traZODone 100 MG tablet Commonly known as:  DESYREL     TAKE these medications   albuterol (2.5 MG/3ML) 0.083% nebulizer solution Commonly known as:  PROVENTIL Take 3 mLs (2.5 mg total) by nebulization every 6 (six) hours as needed for wheezing or shortness of breath. Replaces:  albuterol 108 (90 Base) MCG/ACT inhaler   atorvastatin 10 MG tablet Commonly known as:  LIPITOR Take 1 tablet (10 mg total) by mouth daily.   carvedilol 3.125 MG tablet Commonly known as:   COREG Take 1 tablet (3.125 mg total) by mouth 2 (two) times daily with a meal. What changed:    medication strength  how much to take   clotrimazole 1 % cream Commonly known as:  LOTRIMIN Apply 1 application topically 2 (two) times daily. What changed:  additional instructions   feeding supplement (PRO-STAT SUGAR FREE 64) Liqd Take 30 mLs by mouth 2 (two) times daily.   Gerhardt's butt cream Crea Apply 1 application topically 3 (three) times daily.   insulin glargine 100 UNIT/ML injection Commonly known as:  LANTUS Inject 0.2 mLs (20 Units total) into the skin at bedtime.   lactulose 10 GM/15ML solution Commonly known as:  CHRONULAC Take 30 mLs (20 g total) by mouth daily. Start taking on:  01/31/2018   levETIRAcetam 100 MG/ML solution Commonly known as:  KEPPRA Take 10 mLs (1,000 mg total) by mouth 2 (two) times daily. Replaces:  levETIRAcetam 1000 MG tablet   LORazepam 2 MG/ML injection Commonly known as:  ATIVAN Inject 0.25 mLs (0.5 mg total) into the vein every 4 (four) hours as needed for anxiety.   mouth rinse Liqd solution 15 mLs by Mouth Rinse route 2 (two) times daily.   nicotine 7 mg/24hr patch Commonly known as:  NICODERM CQ - dosed in mg/24 hr Place 1 patch (7 mg total) onto the skin daily. Start taking on:  01/31/2018   pantoprazole 40 MG tablet Commonly known as:  PROTONIX Take 1 tablet (40 mg total) by  mouth daily. Start taking on:  01/31/2018   QUEtiapine 25 MG tablet Commonly known as:  SEROQUEL Take 1 tablet (25 mg total) by mouth 2 (two) times daily.   RESOURCE THICKENUP CLEAR Powd Take 120 g by mouth as needed.   senna-docusate 8.6-50 MG tablet Commonly known as:  Senokot-S Take 1 tablet by mouth 2 (two) times daily.   warfarin 7.5 MG tablet Commonly known as:  COUMADIN Take as directed. If you are unsure how to take this medication, talk to your nurse or doctor. Original instructions:  Take 1 tablet (7.5 mg total) by mouth daily at  6 PM. What changed:    medication strength  how much to take  when to take this  additional instructions  Another medication with the same name was removed. Continue taking this medication, and follow the directions you see here.       Disposition and follow-up:   AdamAniceto Savage was discharged from [facility] in Stable condition.  At the hospital follow up visit please address:  1. Patient with mechanical valve replacement and needs Warfarin. Warfarin held for few days during this admission due to GI bleeding, retroperitoneal bleeding and PEA.  At discharge, his INR is still subtherapeutic (1.59) probably due to Vit k he received before. Please check INR. Also follow up for any sign of bleeding.  2. He had some scrotal and penile swelling and some urinary retention at the day of discharge. We put in and out cath. Please evaluate him for urinary retention  3. Patient has Hx of seizure s/p temporal lobectomy with some progressive dementia. Please make sure he gets his seizure treatment and be cautious about Haldol, Mirtazapine. Also with progressive dementia related to temporal lobectomy, please ba cautious about sedating.  4. Antihypertensive medications held after GI bleeding, patient remained normotensive. Please follow up his BP.  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 Needs ID follow up  6. COPD: has been stable on this hospitalization. 7.  Labs / imaging needed at time of follow-up: Brain MRI, CBC, INR  8  Pending labs/ test needing follow-up: None  Follow-up Appointments:  Contact information for follow-up providers    Mauri Pole, MD Follow up on 03/04/2018.   Specialty:  Gastroenterology Why:  1:45 follow up with lever MD.   Contact information: [place] [address] [phone]            Contact information for after-discharge care    [facility] .   Service:  Skilled Nursing Contact information: [address] [place] [facility] Course by problem list: [name] is a 67 y.o male with a history of AS s/p mechanical valve replacement, wason Warfarin, epilepsys/p temporal lobectomy, and DM who presentedinitiallywith worsening of rectal pain and bleeding.  1. Intertrigo, hemorrhoid bleeding: treated with topical clotrimazol and hydrocortisone supp. Improved.  2. GI bleeding: He was doing well until 10/05 when he subsequently developed acute upper GI bleed (Mallory weis tear on EGD).Got blood transfusion and got stable.  2. Alterd mental status: With Hx of chronic intermittent altered mental status. Worsened during admission. No metabolic causes were found. EEG was negative. Head Ct scan did show encephalomalacia and changes 2/2 temporal lobectomy. Patient has Neurology consulted and commented that  he has progressive dementia due to temporal lobectomy. MRI was canceled that day due to agitation. Patient mental status was labile. (Overall worsening after he came back from ICU and did not improve when he was discharging to long care hospital.)  3. PEA arrest: Patient then complicated withPEA arrest at 10/10, 2/2hypovolemia/ hemorraghic shock from large right retroperitoneal hemorrhage, resuscitated well, transferred to ICU. Anticoagulation reversed with Vit K.Hgb remainedstable,coags stable,Off pressorsand came back from ICU.  4- Hx of mechanical aortic valve replacement: Has been on chronic warfarin. Warfarin was held for few days in this admission due to GI bleeding and then retroperitoneal bleeding. Receive vit K at ICU. Then resumed Warfarin without bridge. At discharge, his INR is still subtherapeutic (1.59) probably due to Vit k he received before.   5. Seizure disorder. S/p temporal lobectomy before. On Keppra 1000  BID Has been seizure free on this hospitalization. Nl EEG 6. HTN: Anti hypertensive medications and Lasix held due to bleeding and hypotension. Patient remained normotensive  7. DM: Was treat with SSI and 20 units of Lantus. Stable during hospitalization. Discharge Vitals:   BP 116/64 (BP Location: Right Arm)   Pulse 85   Temp 97.8 F (36.6 C) (Oral)   Resp (!) 21   Ht 6' (1.829 m)   Wt 133.1 kg   SpO2 100%   BMI 39.80 kg/m   Pertinent Labs, Studies, and Procedures: Last 3: CBC Latest Ref Rng & Units 01/30/2018 01/29/2018 01/28/2018  WBC 4.0 - 10.5 K/uL 11.5(H) 11.5(H) 10.5  Hemoglobin 13.0 - 17.0 g/dL 10.7(L) 10.5(L) 10.8(L)  Hematocrit 39.0 - 52.0 % 35.2(L) 33.5(L) 35.6(L)  Platelets 150 - 400 K/uL 166 160 114(L)   BMP Latest Ref Rng & Units 01/30/2018 01/29/2018 01/28/2018  Glucose 70 - 99 mg/dL 94 164(H) 119(H)  BUN 8 - 23 mg/dL 28(H) 32(H) 39(H)  Creatinine 0.61 - 1.24 mg/dL 1.20 1.36(H) 1.47(H)  BUN/Creat Ratio 10 - 24 - - -  Sodium 135 - 145 mmol/L 141 142 138  Potassium 3.5 - 5.1 mmol/L 3.6 4.2 4.0  Chloride 98 - 111 mmol/L 109 110 105  CO2 22 - 32 mmol/L '25 24 24  ' Calcium 8.9 - 10.3 mg/dL 8.3(L) 8.3(L) 8.2(L)   Pelvis CT w contrast: 01/15/2018 IMPRESSION: No acute finding. No evidence of perianal collection or Inflammation.  EGD: 01/18/2018 esophagitis with no bleeding was found 34 to 38 cm from the incisors. Three tongues of salmon-colored mucosa were present at 36 cm. The maximum longitudinal extent of these esophageal mucosal changes was 2 cm in length. No esophageal varices. A less than 5 mm non-bleeding Mallory-Weiss tear with stigmata of recent bleeding, small adherent heme was found. No visible vessel. Patchy mild inflammation characterized by congestion (edema) and erythema was found in the entire examined stomach. No gastric varices. The examined duodenum was normal. EEG: Normal CT scan head w/o contrast 01/19/2018 IMPRESSION: Status post right  temporal lobectomy with encephalomalacia throughout this area. There is mild periventricular small vessel disease. No acute infarct. No mass or hemorrhage. There are foci of arterial vascular calcification. Extensive post craniotomy bone defects on the right. Extensive paranasal sinus disease on the left. 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 There is rightward deviation of the nasal septum.  Liver U/S: 01/22/2018 IMPRESSION: Surgical absence of the gallbladder. Cirrhotic changes in the liver.  CT abdomen w/o contrast: 01/22/2018 IMPRESSION: Large right retroperitoneal hematoma extending from posterior to the lower pole of the right  kidney into the pelvis and into the extraperitoneal space in the lower pelvis. This measures 17 x 13.4 x 10.5 cm.  Mild fullness of the right renal collecting system and proximal ureter may be related to mass effect from the large retroperitoneal hematoma. No visible obstructing stones.  Small bilateral pleural effusions with bibasilar atelectasis.  Discharge Instructions: Thanks for allowing [place] to taking care of you at [facility]. Please take prescribed mediations as instructed and continue your care with physicians at [facility]. If having any questions or concern, please call us at [phone].  Thanks,  Dr. Linna Hoff Signed: Dewayne Hatch, MD 01/30/2018, 3:43 PM   Pager: [phone]

## 2018-01-30 NOTE — Progress Notes (Signed)
   Subjective: Patient is sleeping. Falls sleep frequently when trying to talk to him. No moaning.  No complaints.  Objective:  Vital signs in last 24 hours: Vitals:   01/29/18 1712 01/29/18 1953 01/29/18 2327 01/30/18 0401  BP: 115/77 134/64 124/72 116/64  Pulse: 79 83 87 85  Resp: 20 (!) 24 (!) 23 (!) 21  Temp: (!) 97.5 F (36.4 C) 97.7 F (36.5 C) 99.2 F (37.3 C) 97.8 F (36.6 C)  TempSrc: Axillary Oral Axillary Oral  SpO2: 99% 100% 98% 100%  Weight:      Height:       General: Is somnolent, responds to verbal stimuli but falls asleep again.  Is not aggressive or combative CV: Normal rate and rhythmMechanical valve sound is present.  Chest and lung exam: Normal work of breathing. In no acute distress.  Decreased breath sound on ant chest exam.  Abdomen: Soft, with some distention, with no tenderness GU: Penile and scrotal swelling Abdominal distension with no tenderness. Extremities: 1+ bilateral pitting edema  Assessment/Plan: Adam Savage is a 67 y.o male with a history of AS s/p mechanical valve replacement, wason Warfarin, epilepsys/p temporal lobectomy, and DM who presentedinitiallywith worsening rectal pain and bleeding.He was doing well until 10/05 when he subsequently developed acute upper GI bleed as outlined below. Also complicated with altered mental status. He was complicated withPEA arrest at 10/10, 2/2hypovolemia/ hemorraghic shock from large R retroperitoneal hemorrhage, resuscitated wellhgb remainedstable,coags stable,Off pressorsand came back from ICU. 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 No agitation, is not combative.  -Continue Seroquel25 mg BID -Follow-up with neurology outpatient considering additional treatment required -Keppra to1000mg  BID  -PO -BMP daily -Plan to discharge to [facility] for long-term care per daughter wishes  HTN, HLD: BP today:116/64  -C/wCoreg 3.125 mg BID -C/w Lipitor  Mechanical AV replacement: INR is 1.6, still not therapeutic. (got Vit K in ICU) -Warfarin per pharmacy (Started on 10/13.no bridge)  DM without complication: Last BG:  -C/wSSI resistant -c/wLantus 15 units HS  Perianal and gluteal intertrigo.He feels better today. Denies pain - Continuingclotrimazole creamBID  Internal Hemorrhoids:no bleeding.  - Continue hydrocortisone suppositoriesand Senokot  Hx of retroperitoneal bleeding: Stable, no abdominal pain, VS and Hb stable Is back on Warfarin per pharmacy.  We will subtherapeutic likely secondary to saving vitamin K in ICU INR: 1.59   Advanced fibrosis with chronic HCV: LA Class C reflux esophagitis with possible short-segment Barrett's on recent EGD Nonbleeding MW tear on recent EGD  -Continuelacutulose -C/w Protonix  *Oliguria: Patient for oliguria by nurse today.  Bladder scan showed 500 cc.  Patient has scrotal and penile swelling that may contribute to that. -In and out catheter  VTE ppx: Warfarin (is still subtherapeutic)+ SCD Diet: Carb modified IV f: none Code: DNR  Dispo: Discharge today to [facility])  Adam Hatch, MD 01/30/2018, 6:35 AM Pager: [phone]

## 2018-01-30 NOTE — Progress Notes (Signed)
  Speech Language Pathology Treatment: Dysphagia  Patient Details Name: Adam Savage MRN: [id number] DOB: July 11, 1950 Today's Date: 01/30/2018 Time: 0379-5583 SLP Time Calculation (min) (ACUTE ONLY): 8 min  Assessment / Plan / Recommendation Clinical Impression  Sleeping soundly, awakened with max cues with continued cueing throughout session. Attempts to reposition met with cries of pain but unable to specify location or level of pain. Hand over hand assist to consume nectar thickened juice - limited amount given lethargy and requiring nursing care. No indications of decreased airway protection this session but needs continued full assist due to current cognitive impairments with Dys 2 (minced) texture and nectar thick liquids. Plans to discharge to [facility] today.    HPI HPI: 67 year old man presented on 10/3 with upper GI bleed; found to have Mallory-Weiss tear.  Developed AMS 10/10, suffered PEA arrrest and was intubated until 10/11.  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  Clinical swallow evaluation 10/11 with recs for resuming regular diet, thin liquids; pt was confused but tolerating POs well.       SLP Plan  Continue with current plan of care       Recommendations  Diet recommendations: Dysphagia 2 (fine chop);Nectar-thick liquid Liquids provided via: Cup Medication Administration: Crushed with puree Supervision: Staff to assist with self feeding;Full supervision/cueing for compensatory strategies Compensations: Minimize environmental distractions;Slow rate;Small sips/bites;Clear throat intermittently Postural Changes and/or Swallow Maneuvers: Seated upright 90 degrees                 Oral Care Recommendations: Oral care BID Follow up Recommendations: LTACH SLP Visit Diagnosis: Dysphagia, pharyngeal phase (R13.13) Plan: Continue with current plan of care                      Houston Siren 01/30/2018, 3:52 PM  Orbie Pyo Colvin Caroli.Ed Risk analyst [phone] Office [phone]

## 2018-02-01 DIAGNOSIS — G4089 Other seizures: Secondary | ICD-10-CM

## 2018-02-01 DIAGNOSIS — J9621 Acute and chronic respiratory failure with hypoxia: Secondary | ICD-10-CM

## 2018-02-01 DIAGNOSIS — I5032 Chronic diastolic (congestive) heart failure: Secondary | ICD-10-CM

## 2018-02-01 DIAGNOSIS — J449 Chronic obstructive pulmonary disease, unspecified: Secondary | ICD-10-CM

## 2018-02-02 DIAGNOSIS — J9621 Acute and chronic respiratory failure with hypoxia: Secondary | ICD-10-CM

## 2018-02-02 DIAGNOSIS — J449 Chronic obstructive pulmonary disease, unspecified: Secondary | ICD-10-CM

## 2018-02-02 DIAGNOSIS — G4089 Other seizures: Secondary | ICD-10-CM

## 2018-02-02 DIAGNOSIS — I5032 Chronic diastolic (congestive) heart failure: Secondary | ICD-10-CM

## 2018-02-03 DIAGNOSIS — I5032 Chronic diastolic (congestive) heart failure: Secondary | ICD-10-CM

## 2018-02-03 DIAGNOSIS — J449 Chronic obstructive pulmonary disease, unspecified: Secondary | ICD-10-CM

## 2018-02-03 DIAGNOSIS — G4089 Other seizures: Secondary | ICD-10-CM

## 2018-02-03 DIAGNOSIS — J9621 Acute and chronic respiratory failure with hypoxia: Secondary | ICD-10-CM

## 2018-02-04 DIAGNOSIS — J449 Chronic obstructive pulmonary disease, unspecified: Secondary | ICD-10-CM

## 2018-02-04 DIAGNOSIS — J9621 Acute and chronic respiratory failure with hypoxia: Secondary | ICD-10-CM

## 2018-02-04 DIAGNOSIS — G4089 Other seizures: Secondary | ICD-10-CM

## 2018-02-04 DIAGNOSIS — I5032 Chronic diastolic (congestive) heart failure: Secondary | ICD-10-CM

## 2018-02-05 DIAGNOSIS — J449 Chronic obstructive pulmonary disease, unspecified: Secondary | ICD-10-CM

## 2018-02-05 DIAGNOSIS — I5032 Chronic diastolic (congestive) heart failure: Secondary | ICD-10-CM

## 2018-02-05 DIAGNOSIS — J9621 Acute and chronic respiratory failure with hypoxia: Secondary | ICD-10-CM

## 2018-02-05 DIAGNOSIS — G4089 Other seizures: Secondary | ICD-10-CM

## 2018-02-06 DIAGNOSIS — J9621 Acute and chronic respiratory failure with hypoxia: Secondary | ICD-10-CM

## 2018-02-06 DIAGNOSIS — I5032 Chronic diastolic (congestive) heart failure: Secondary | ICD-10-CM

## 2018-02-06 DIAGNOSIS — G4089 Other seizures: Secondary | ICD-10-CM

## 2018-02-06 DIAGNOSIS — J449 Chronic obstructive pulmonary disease, unspecified: Secondary | ICD-10-CM

## 2018-02-07 DIAGNOSIS — J449 Chronic obstructive pulmonary disease, unspecified: Secondary | ICD-10-CM

## 2018-02-07 DIAGNOSIS — I5032 Chronic diastolic (congestive) heart failure: Secondary | ICD-10-CM

## 2018-02-07 DIAGNOSIS — G4089 Other seizures: Secondary | ICD-10-CM

## 2018-02-07 DIAGNOSIS — J9621 Acute and chronic respiratory failure with hypoxia: Secondary | ICD-10-CM

## 2018-02-16 DIAGNOSIS — J449 Chronic obstructive pulmonary disease, unspecified: Secondary | ICD-10-CM

## 2018-02-16 DIAGNOSIS — J9621 Acute and chronic respiratory failure with hypoxia: Secondary | ICD-10-CM

## 2018-02-16 DIAGNOSIS — I5032 Chronic diastolic (congestive) heart failure: Secondary | ICD-10-CM

## 2018-02-16 DIAGNOSIS — G4089 Other seizures: Secondary | ICD-10-CM

## 2018-02-17 DIAGNOSIS — I5032 Chronic diastolic (congestive) heart failure: Secondary | ICD-10-CM

## 2018-02-17 DIAGNOSIS — J449 Chronic obstructive pulmonary disease, unspecified: Secondary | ICD-10-CM

## 2018-02-17 DIAGNOSIS — G4089 Other seizures: Secondary | ICD-10-CM

## 2018-02-17 DIAGNOSIS — J9621 Acute and chronic respiratory failure with hypoxia: Secondary | ICD-10-CM

## 2018-02-18 DIAGNOSIS — J9621 Acute and chronic respiratory failure with hypoxia: Secondary | ICD-10-CM

## 2018-02-18 DIAGNOSIS — J449 Chronic obstructive pulmonary disease, unspecified: Secondary | ICD-10-CM

## 2018-02-18 DIAGNOSIS — I5032 Chronic diastolic (congestive) heart failure: Secondary | ICD-10-CM

## 2018-02-18 DIAGNOSIS — G4089 Other seizures: Secondary | ICD-10-CM

## 2018-02-19 DIAGNOSIS — G4089 Other seizures: Secondary | ICD-10-CM

## 2018-02-19 DIAGNOSIS — J9621 Acute and chronic respiratory failure with hypoxia: Secondary | ICD-10-CM

## 2018-02-19 DIAGNOSIS — J449 Chronic obstructive pulmonary disease, unspecified: Secondary | ICD-10-CM

## 2018-02-19 DIAGNOSIS — I5032 Chronic diastolic (congestive) heart failure: Secondary | ICD-10-CM

## 2018-02-20 DIAGNOSIS — J9621 Acute and chronic respiratory failure with hypoxia: Secondary | ICD-10-CM

## 2018-02-20 DIAGNOSIS — J449 Chronic obstructive pulmonary disease, unspecified: Secondary | ICD-10-CM

## 2018-02-20 DIAGNOSIS — G4089 Other seizures: Secondary | ICD-10-CM

## 2018-02-20 DIAGNOSIS — I5032 Chronic diastolic (congestive) heart failure: Secondary | ICD-10-CM

## 2018-02-21 DIAGNOSIS — J9621 Acute and chronic respiratory failure with hypoxia: Secondary | ICD-10-CM

## 2018-02-21 DIAGNOSIS — G4089 Other seizures: Secondary | ICD-10-CM

## 2018-02-21 DIAGNOSIS — J449 Chronic obstructive pulmonary disease, unspecified: Secondary | ICD-10-CM

## 2018-02-21 DIAGNOSIS — I5032 Chronic diastolic (congestive) heart failure: Secondary | ICD-10-CM

## 2018-02-22 DIAGNOSIS — G4089 Other seizures: Secondary | ICD-10-CM

## 2018-02-22 DIAGNOSIS — I5032 Chronic diastolic (congestive) heart failure: Secondary | ICD-10-CM

## 2018-02-22 DIAGNOSIS — J449 Chronic obstructive pulmonary disease, unspecified: Secondary | ICD-10-CM

## 2018-02-22 DIAGNOSIS — J9621 Acute and chronic respiratory failure with hypoxia: Secondary | ICD-10-CM

## 2018-02-24 DIAGNOSIS — G40909 Epilepsy, unspecified, not intractable, without status epilepticus: Secondary | ICD-10-CM | POA: Diagnosis not present

## 2018-02-24 DIAGNOSIS — R071 Chest pain on breathing: Secondary | ICD-10-CM | POA: Diagnosis not present

## 2018-02-24 DIAGNOSIS — Z7901 Long term (current) use of anticoagulants: Secondary | ICD-10-CM | POA: Diagnosis not present

## 2018-02-24 DIAGNOSIS — R5381 Other malaise: Secondary | ICD-10-CM | POA: Diagnosis not present

## 2018-02-24 DIAGNOSIS — K661 Hemoperitoneum: Secondary | ICD-10-CM | POA: Diagnosis not present

## 2018-02-24 DIAGNOSIS — E7849 Other hyperlipidemia: Secondary | ICD-10-CM | POA: Diagnosis not present

## 2018-02-24 DIAGNOSIS — I5032 Chronic diastolic (congestive) heart failure: Secondary | ICD-10-CM | POA: Diagnosis not present

## 2018-02-24 DIAGNOSIS — I059 Rheumatic mitral valve disease, unspecified: Secondary | ICD-10-CM | POA: Diagnosis not present

## 2018-02-24 DIAGNOSIS — F329 Major depressive disorder, single episode, unspecified: Secondary | ICD-10-CM | POA: Diagnosis not present

## 2018-02-24 DIAGNOSIS — D62 Acute posthemorrhagic anemia: Secondary | ICD-10-CM | POA: Diagnosis not present

## 2018-02-24 DIAGNOSIS — R578 Other shock: Secondary | ICD-10-CM | POA: Diagnosis not present

## 2018-02-24 DIAGNOSIS — G4733 Obstructive sleep apnea (adult) (pediatric): Secondary | ICD-10-CM | POA: Diagnosis not present

## 2018-02-24 DIAGNOSIS — F339 Major depressive disorder, recurrent, unspecified: Secondary | ICD-10-CM | POA: Diagnosis not present

## 2018-02-24 DIAGNOSIS — R233 Spontaneous ecchymoses: Secondary | ICD-10-CM | POA: Diagnosis not present

## 2018-02-24 DIAGNOSIS — M2550 Pain in unspecified joint: Secondary | ICD-10-CM | POA: Diagnosis not present

## 2018-02-24 DIAGNOSIS — F419 Anxiety disorder, unspecified: Secondary | ICD-10-CM | POA: Diagnosis not present

## 2018-02-24 DIAGNOSIS — Z5181 Encounter for therapeutic drug level monitoring: Secondary | ICD-10-CM | POA: Diagnosis not present

## 2018-02-24 DIAGNOSIS — Z7401 Bed confinement status: Secondary | ICD-10-CM | POA: Diagnosis not present

## 2018-02-24 DIAGNOSIS — I10 Essential (primary) hypertension: Secondary | ICD-10-CM | POA: Diagnosis not present

## 2018-02-24 DIAGNOSIS — F0391 Unspecified dementia with behavioral disturbance: Secondary | ICD-10-CM | POA: Diagnosis not present

## 2018-02-24 DIAGNOSIS — I469 Cardiac arrest, cause unspecified: Secondary | ICD-10-CM | POA: Diagnosis not present

## 2018-02-24 DIAGNOSIS — G4089 Other seizures: Secondary | ICD-10-CM | POA: Diagnosis not present

## 2018-02-24 DIAGNOSIS — R634 Abnormal weight loss: Secondary | ICD-10-CM | POA: Diagnosis not present

## 2018-02-24 DIAGNOSIS — R05 Cough: Secondary | ICD-10-CM | POA: Diagnosis not present

## 2018-02-24 DIAGNOSIS — R4189 Other symptoms and signs involving cognitive functions and awareness: Secondary | ICD-10-CM | POA: Diagnosis not present

## 2018-02-24 DIAGNOSIS — G894 Chronic pain syndrome: Secondary | ICD-10-CM | POA: Diagnosis not present

## 2018-02-24 DIAGNOSIS — F062 Psychotic disorder with delusions due to known physiological condition: Secondary | ICD-10-CM | POA: Diagnosis not present

## 2018-02-24 DIAGNOSIS — E119 Type 2 diabetes mellitus without complications: Secondary | ICD-10-CM | POA: Diagnosis not present

## 2018-02-24 DIAGNOSIS — R0989 Other specified symptoms and signs involving the circulatory and respiratory systems: Secondary | ICD-10-CM | POA: Diagnosis not present

## 2018-02-24 DIAGNOSIS — D500 Iron deficiency anemia secondary to blood loss (chronic): Secondary | ICD-10-CM | POA: Diagnosis not present

## 2018-02-24 DIAGNOSIS — Q249 Congenital malformation of heart, unspecified: Secondary | ICD-10-CM | POA: Diagnosis not present

## 2018-02-24 DIAGNOSIS — R2689 Other abnormalities of gait and mobility: Secondary | ICD-10-CM | POA: Diagnosis not present

## 2018-02-24 DIAGNOSIS — J302 Other seasonal allergic rhinitis: Secondary | ICD-10-CM | POA: Diagnosis not present

## 2018-02-24 DIAGNOSIS — Z952 Presence of prosthetic heart valve: Secondary | ICD-10-CM | POA: Diagnosis not present

## 2018-02-24 DIAGNOSIS — M6281 Muscle weakness (generalized): Secondary | ICD-10-CM | POA: Diagnosis not present

## 2018-02-24 DIAGNOSIS — R339 Retention of urine, unspecified: Secondary | ICD-10-CM | POA: Diagnosis not present

## 2018-02-24 DIAGNOSIS — M549 Dorsalgia, unspecified: Secondary | ICD-10-CM | POA: Diagnosis not present

## 2018-02-24 DIAGNOSIS — K219 Gastro-esophageal reflux disease without esophagitis: Secondary | ICD-10-CM | POA: Diagnosis not present

## 2018-02-24 DIAGNOSIS — K922 Gastrointestinal hemorrhage, unspecified: Secondary | ICD-10-CM | POA: Diagnosis not present

## 2018-02-24 DIAGNOSIS — G3109 Other frontotemporal dementia: Secondary | ICD-10-CM | POA: Diagnosis not present

## 2018-02-24 DIAGNOSIS — J449 Chronic obstructive pulmonary disease, unspecified: Secondary | ICD-10-CM | POA: Diagnosis not present

## 2018-02-24 DIAGNOSIS — Z9911 Dependence on respirator [ventilator] status: Secondary | ICD-10-CM | POA: Diagnosis not present

## 2018-02-24 DIAGNOSIS — F0390 Unspecified dementia without behavioral disturbance: Secondary | ICD-10-CM | POA: Diagnosis not present

## 2018-02-24 DIAGNOSIS — J9621 Acute and chronic respiratory failure with hypoxia: Secondary | ICD-10-CM | POA: Diagnosis not present

## 2018-02-25 DIAGNOSIS — R578 Other shock: Secondary | ICD-10-CM | POA: Diagnosis not present

## 2018-02-25 DIAGNOSIS — I469 Cardiac arrest, cause unspecified: Secondary | ICD-10-CM | POA: Diagnosis not present

## 2018-02-25 DIAGNOSIS — K922 Gastrointestinal hemorrhage, unspecified: Secondary | ICD-10-CM | POA: Diagnosis not present

## 2018-02-25 DIAGNOSIS — J9621 Acute and chronic respiratory failure with hypoxia: Secondary | ICD-10-CM | POA: Diagnosis not present

## 2018-02-27 DIAGNOSIS — J449 Chronic obstructive pulmonary disease, unspecified: Secondary | ICD-10-CM | POA: Diagnosis not present

## 2018-02-27 DIAGNOSIS — K922 Gastrointestinal hemorrhage, unspecified: Secondary | ICD-10-CM | POA: Diagnosis not present

## 2018-02-27 DIAGNOSIS — F0390 Unspecified dementia without behavioral disturbance: Secondary | ICD-10-CM | POA: Diagnosis not present

## 2018-02-27 DIAGNOSIS — G4733 Obstructive sleep apnea (adult) (pediatric): Secondary | ICD-10-CM | POA: Diagnosis not present

## 2018-03-04 ENCOUNTER — Ambulatory Visit: Payer: Medicare Other | Admitting: Gastroenterology

## 2018-03-05 ENCOUNTER — Other Ambulatory Visit: Payer: Self-pay

## 2018-03-05 ENCOUNTER — Ambulatory Visit: Payer: Medicare Other | Admitting: Neurology

## 2018-03-05 DIAGNOSIS — G894 Chronic pain syndrome: Secondary | ICD-10-CM | POA: Diagnosis not present

## 2018-03-05 DIAGNOSIS — Z952 Presence of prosthetic heart valve: Secondary | ICD-10-CM | POA: Diagnosis not present

## 2018-03-11 DIAGNOSIS — I059 Rheumatic mitral valve disease, unspecified: Secondary | ICD-10-CM | POA: Diagnosis not present

## 2018-03-11 DIAGNOSIS — Z7901 Long term (current) use of anticoagulants: Secondary | ICD-10-CM | POA: Diagnosis not present

## 2018-03-11 DIAGNOSIS — Z952 Presence of prosthetic heart valve: Secondary | ICD-10-CM | POA: Diagnosis not present

## 2018-03-13 DIAGNOSIS — I059 Rheumatic mitral valve disease, unspecified: Secondary | ICD-10-CM | POA: Diagnosis not present

## 2018-03-13 DIAGNOSIS — Z5181 Encounter for therapeutic drug level monitoring: Secondary | ICD-10-CM | POA: Diagnosis not present

## 2018-03-13 DIAGNOSIS — Z952 Presence of prosthetic heart valve: Secondary | ICD-10-CM | POA: Diagnosis not present

## 2018-03-13 DIAGNOSIS — Z7901 Long term (current) use of anticoagulants: Secondary | ICD-10-CM | POA: Diagnosis not present

## 2018-03-19 DIAGNOSIS — Q249 Congenital malformation of heart, unspecified: Secondary | ICD-10-CM | POA: Diagnosis not present

## 2018-03-19 DIAGNOSIS — Z5181 Encounter for therapeutic drug level monitoring: Secondary | ICD-10-CM | POA: Diagnosis not present

## 2018-03-19 DIAGNOSIS — R634 Abnormal weight loss: Secondary | ICD-10-CM | POA: Diagnosis not present

## 2018-03-19 DIAGNOSIS — I059 Rheumatic mitral valve disease, unspecified: Secondary | ICD-10-CM | POA: Diagnosis not present

## 2018-03-23 DIAGNOSIS — I059 Rheumatic mitral valve disease, unspecified: Secondary | ICD-10-CM | POA: Diagnosis not present

## 2018-03-23 DIAGNOSIS — Z7901 Long term (current) use of anticoagulants: Secondary | ICD-10-CM | POA: Diagnosis not present

## 2018-03-23 DIAGNOSIS — Z5181 Encounter for therapeutic drug level monitoring: Secondary | ICD-10-CM | POA: Diagnosis not present

## 2018-03-25 DIAGNOSIS — M6281 Muscle weakness (generalized): Secondary | ICD-10-CM | POA: Diagnosis not present

## 2018-03-25 DIAGNOSIS — J449 Chronic obstructive pulmonary disease, unspecified: Secondary | ICD-10-CM | POA: Diagnosis not present

## 2018-03-25 DIAGNOSIS — I10 Essential (primary) hypertension: Secondary | ICD-10-CM | POA: Diagnosis not present

## 2018-03-26 DIAGNOSIS — I059 Rheumatic mitral valve disease, unspecified: Secondary | ICD-10-CM | POA: Diagnosis not present

## 2018-03-26 DIAGNOSIS — Z5181 Encounter for therapeutic drug level monitoring: Secondary | ICD-10-CM | POA: Diagnosis not present

## 2018-03-30 DIAGNOSIS — R05 Cough: Secondary | ICD-10-CM | POA: Diagnosis not present

## 2018-03-30 DIAGNOSIS — J302 Other seasonal allergic rhinitis: Secondary | ICD-10-CM | POA: Diagnosis not present

## 2018-04-02 DIAGNOSIS — M549 Dorsalgia, unspecified: Secondary | ICD-10-CM | POA: Diagnosis not present

## 2018-04-02 DIAGNOSIS — Z5181 Encounter for therapeutic drug level monitoring: Secondary | ICD-10-CM | POA: Diagnosis not present

## 2018-04-02 DIAGNOSIS — R05 Cough: Secondary | ICD-10-CM | POA: Diagnosis not present

## 2018-04-02 DIAGNOSIS — I059 Rheumatic mitral valve disease, unspecified: Secondary | ICD-10-CM | POA: Diagnosis not present

## 2018-04-03 DIAGNOSIS — R233 Spontaneous ecchymoses: Secondary | ICD-10-CM | POA: Diagnosis not present

## 2018-04-06 DIAGNOSIS — I5032 Chronic diastolic (congestive) heart failure: Secondary | ICD-10-CM | POA: Diagnosis not present

## 2018-04-06 DIAGNOSIS — G40909 Epilepsy, unspecified, not intractable, without status epilepticus: Secondary | ICD-10-CM | POA: Diagnosis not present

## 2018-04-06 DIAGNOSIS — J449 Chronic obstructive pulmonary disease, unspecified: Secondary | ICD-10-CM | POA: Diagnosis not present

## 2018-04-06 DIAGNOSIS — G4733 Obstructive sleep apnea (adult) (pediatric): Secondary | ICD-10-CM | POA: Diagnosis not present

## 2018-04-11 DIAGNOSIS — M545 Low back pain: Secondary | ICD-10-CM | POA: Diagnosis not present

## 2018-04-11 DIAGNOSIS — J449 Chronic obstructive pulmonary disease, unspecified: Secondary | ICD-10-CM | POA: Diagnosis not present

## 2018-04-11 DIAGNOSIS — F0391 Unspecified dementia with behavioral disturbance: Secondary | ICD-10-CM | POA: Diagnosis not present

## 2018-04-11 DIAGNOSIS — Z952 Presence of prosthetic heart valve: Secondary | ICD-10-CM | POA: Diagnosis not present

## 2018-04-11 DIAGNOSIS — E1165 Type 2 diabetes mellitus with hyperglycemia: Secondary | ICD-10-CM | POA: Diagnosis not present

## 2018-04-11 DIAGNOSIS — J9621 Acute and chronic respiratory failure with hypoxia: Secondary | ICD-10-CM | POA: Diagnosis not present

## 2018-04-11 DIAGNOSIS — G4733 Obstructive sleep apnea (adult) (pediatric): Secondary | ICD-10-CM | POA: Diagnosis not present

## 2018-04-11 DIAGNOSIS — I5032 Chronic diastolic (congestive) heart failure: Secondary | ICD-10-CM | POA: Diagnosis not present

## 2018-04-11 DIAGNOSIS — Z79891 Long term (current) use of opiate analgesic: Secondary | ICD-10-CM | POA: Diagnosis not present

## 2018-04-11 DIAGNOSIS — Z8674 Personal history of sudden cardiac arrest: Secondary | ICD-10-CM | POA: Diagnosis not present

## 2018-04-11 DIAGNOSIS — Z794 Long term (current) use of insulin: Secondary | ICD-10-CM | POA: Diagnosis not present

## 2018-04-13 DIAGNOSIS — J9621 Acute and chronic respiratory failure with hypoxia: Secondary | ICD-10-CM | POA: Diagnosis not present

## 2018-04-13 DIAGNOSIS — I5032 Chronic diastolic (congestive) heart failure: Secondary | ICD-10-CM | POA: Diagnosis not present

## 2018-04-13 DIAGNOSIS — E1165 Type 2 diabetes mellitus with hyperglycemia: Secondary | ICD-10-CM | POA: Diagnosis not present

## 2018-04-13 DIAGNOSIS — F0391 Unspecified dementia with behavioral disturbance: Secondary | ICD-10-CM | POA: Diagnosis not present

## 2018-04-13 DIAGNOSIS — J449 Chronic obstructive pulmonary disease, unspecified: Secondary | ICD-10-CM | POA: Diagnosis not present

## 2018-04-13 DIAGNOSIS — M545 Low back pain: Secondary | ICD-10-CM | POA: Diagnosis not present

## 2018-04-14 ENCOUNTER — Other Ambulatory Visit (HOSPITAL_COMMUNITY)
Admission: AD | Admit: 2018-04-14 | Discharge: 2018-04-14 | Disposition: A | Discharge status: Home / Self Care | Payer: Medicare Other | Source: Skilled Nursing Facility | Attending: Family Medicine | Admitting: Family Medicine

## 2018-04-14 DIAGNOSIS — I5032 Chronic diastolic (congestive) heart failure: Secondary | ICD-10-CM | POA: Diagnosis not present

## 2018-04-14 DIAGNOSIS — F0391 Unspecified dementia with behavioral disturbance: Secondary | ICD-10-CM | POA: Diagnosis not present

## 2018-04-14 DIAGNOSIS — J9621 Acute and chronic respiratory failure with hypoxia: Secondary | ICD-10-CM | POA: Diagnosis not present

## 2018-04-14 DIAGNOSIS — Z7901 Long term (current) use of anticoagulants: Secondary | ICD-10-CM | POA: Diagnosis not present

## 2018-04-14 DIAGNOSIS — E1165 Type 2 diabetes mellitus with hyperglycemia: Secondary | ICD-10-CM | POA: Diagnosis not present

## 2018-04-14 DIAGNOSIS — M545 Low back pain: Secondary | ICD-10-CM | POA: Diagnosis not present

## 2018-04-14 DIAGNOSIS — J449 Chronic obstructive pulmonary disease, unspecified: Secondary | ICD-10-CM | POA: Diagnosis not present

## 2018-04-14 LAB — PROTIME-INR
INR: 2.49
Prothrombin Time: 26.6 seconds — ABNORMAL HIGH (ref 11.4–15.2)

## 2018-04-16 DIAGNOSIS — I5032 Chronic diastolic (congestive) heart failure: Secondary | ICD-10-CM | POA: Diagnosis not present

## 2018-04-16 DIAGNOSIS — E1165 Type 2 diabetes mellitus with hyperglycemia: Secondary | ICD-10-CM | POA: Diagnosis not present

## 2018-04-16 DIAGNOSIS — F0391 Unspecified dementia with behavioral disturbance: Secondary | ICD-10-CM | POA: Diagnosis not present

## 2018-04-16 DIAGNOSIS — J9621 Acute and chronic respiratory failure with hypoxia: Secondary | ICD-10-CM | POA: Diagnosis not present

## 2018-04-16 DIAGNOSIS — M545 Low back pain: Secondary | ICD-10-CM | POA: Diagnosis not present

## 2018-04-16 DIAGNOSIS — J449 Chronic obstructive pulmonary disease, unspecified: Secondary | ICD-10-CM | POA: Diagnosis not present

## 2018-04-17 DIAGNOSIS — J9621 Acute and chronic respiratory failure with hypoxia: Secondary | ICD-10-CM | POA: Diagnosis not present

## 2018-04-17 DIAGNOSIS — E1165 Type 2 diabetes mellitus with hyperglycemia: Secondary | ICD-10-CM | POA: Diagnosis not present

## 2018-04-17 DIAGNOSIS — J449 Chronic obstructive pulmonary disease, unspecified: Secondary | ICD-10-CM | POA: Diagnosis not present

## 2018-04-17 DIAGNOSIS — I5032 Chronic diastolic (congestive) heart failure: Secondary | ICD-10-CM | POA: Diagnosis not present

## 2018-04-17 DIAGNOSIS — F0391 Unspecified dementia with behavioral disturbance: Secondary | ICD-10-CM | POA: Diagnosis not present

## 2018-04-17 DIAGNOSIS — M545 Low back pain: Secondary | ICD-10-CM | POA: Diagnosis not present

## 2018-04-20 DIAGNOSIS — I5032 Chronic diastolic (congestive) heart failure: Secondary | ICD-10-CM | POA: Diagnosis not present

## 2018-04-20 DIAGNOSIS — F0391 Unspecified dementia with behavioral disturbance: Secondary | ICD-10-CM | POA: Diagnosis not present

## 2018-04-20 DIAGNOSIS — J9621 Acute and chronic respiratory failure with hypoxia: Secondary | ICD-10-CM | POA: Diagnosis not present

## 2018-04-20 DIAGNOSIS — M545 Low back pain: Secondary | ICD-10-CM | POA: Diagnosis not present

## 2018-04-20 DIAGNOSIS — J449 Chronic obstructive pulmonary disease, unspecified: Secondary | ICD-10-CM | POA: Diagnosis not present

## 2018-04-20 DIAGNOSIS — E1165 Type 2 diabetes mellitus with hyperglycemia: Secondary | ICD-10-CM | POA: Diagnosis not present

## 2018-04-21 DIAGNOSIS — F0391 Unspecified dementia with behavioral disturbance: Secondary | ICD-10-CM | POA: Diagnosis not present

## 2018-04-21 DIAGNOSIS — M545 Low back pain: Secondary | ICD-10-CM | POA: Diagnosis not present

## 2018-04-21 DIAGNOSIS — E1165 Type 2 diabetes mellitus with hyperglycemia: Secondary | ICD-10-CM | POA: Diagnosis not present

## 2018-04-21 DIAGNOSIS — J449 Chronic obstructive pulmonary disease, unspecified: Secondary | ICD-10-CM | POA: Diagnosis not present

## 2018-04-21 DIAGNOSIS — I5032 Chronic diastolic (congestive) heart failure: Secondary | ICD-10-CM | POA: Diagnosis not present

## 2018-04-21 DIAGNOSIS — J9621 Acute and chronic respiratory failure with hypoxia: Secondary | ICD-10-CM | POA: Diagnosis not present

## 2018-04-22 ENCOUNTER — Ambulatory Visit (INDEPENDENT_AMBULATORY_CARE_PROVIDER_SITE_OTHER): Payer: Medicare Other | Admitting: Pharmacist

## 2018-04-22 DIAGNOSIS — I10 Essential (primary) hypertension: Secondary | ICD-10-CM | POA: Diagnosis not present

## 2018-04-22 DIAGNOSIS — E7200 Disorders of amino-acid transport, unspecified: Secondary | ICD-10-CM | POA: Diagnosis not present

## 2018-04-22 DIAGNOSIS — M545 Low back pain: Secondary | ICD-10-CM | POA: Diagnosis not present

## 2018-04-22 DIAGNOSIS — K219 Gastro-esophageal reflux disease without esophagitis: Secondary | ICD-10-CM | POA: Diagnosis not present

## 2018-04-22 DIAGNOSIS — Z952 Presence of prosthetic heart valve: Secondary | ICD-10-CM

## 2018-04-22 DIAGNOSIS — F321 Major depressive disorder, single episode, moderate: Secondary | ICD-10-CM | POA: Diagnosis not present

## 2018-04-22 DIAGNOSIS — I5030 Unspecified diastolic (congestive) heart failure: Secondary | ICD-10-CM | POA: Diagnosis not present

## 2018-04-22 DIAGNOSIS — E785 Hyperlipidemia, unspecified: Secondary | ICD-10-CM | POA: Diagnosis not present

## 2018-04-22 DIAGNOSIS — F0150 Vascular dementia without behavioral disturbance: Secondary | ICD-10-CM | POA: Diagnosis not present

## 2018-04-22 DIAGNOSIS — E1159 Type 2 diabetes mellitus with other circulatory complications: Secondary | ICD-10-CM | POA: Diagnosis not present

## 2018-04-22 DIAGNOSIS — Z5181 Encounter for therapeutic drug level monitoring: Secondary | ICD-10-CM | POA: Diagnosis not present

## 2018-04-22 DIAGNOSIS — S34102S Unspecified injury to L2 level of lumbar spinal cord, sequela: Secondary | ICD-10-CM | POA: Diagnosis not present

## 2018-04-22 DIAGNOSIS — I214 Non-ST elevation (NSTEMI) myocardial infarction: Secondary | ICD-10-CM

## 2018-04-22 DIAGNOSIS — K5900 Constipation, unspecified: Secondary | ICD-10-CM | POA: Diagnosis not present

## 2018-04-22 DIAGNOSIS — G4709 Other insomnia: Secondary | ICD-10-CM | POA: Diagnosis not present

## 2018-04-22 LAB — POCT INR: INR: 2.2 (ref 2.0–3.0)

## 2018-04-22 NOTE — Patient Instructions (Signed)
Description   Continue taking 4mg  daily.  Recheck INR in 2 weeks

## 2018-04-23 ENCOUNTER — Telehealth: Payer: Self-pay | Admitting: *Deleted

## 2018-04-23 DIAGNOSIS — J449 Chronic obstructive pulmonary disease, unspecified: Secondary | ICD-10-CM | POA: Diagnosis not present

## 2018-04-23 DIAGNOSIS — E1165 Type 2 diabetes mellitus with hyperglycemia: Secondary | ICD-10-CM | POA: Diagnosis not present

## 2018-04-23 DIAGNOSIS — J9621 Acute and chronic respiratory failure with hypoxia: Secondary | ICD-10-CM | POA: Diagnosis not present

## 2018-04-23 DIAGNOSIS — L84 Corns and callosities: Secondary | ICD-10-CM | POA: Diagnosis not present

## 2018-04-23 DIAGNOSIS — M545 Low back pain: Secondary | ICD-10-CM | POA: Diagnosis not present

## 2018-04-23 DIAGNOSIS — I5032 Chronic diastolic (congestive) heart failure: Secondary | ICD-10-CM | POA: Diagnosis not present

## 2018-04-23 DIAGNOSIS — E1051 Type 1 diabetes mellitus with diabetic peripheral angiopathy without gangrene: Secondary | ICD-10-CM | POA: Diagnosis not present

## 2018-04-23 DIAGNOSIS — F0391 Unspecified dementia with behavioral disturbance: Secondary | ICD-10-CM | POA: Diagnosis not present

## 2018-04-23 NOTE — Telephone Encounter (Signed)
ERROR

## 2018-04-23 NOTE — Telephone Encounter (Signed)
Returned call and Sharyn Lull again not available.   Spoke with Janett Billow, RN at [facility] and gave order to have his INR rechecked as per visit yesterday. They will call INR to [place] office.

## 2018-04-23 NOTE — Telephone Encounter (Signed)
Adam Savage from [facility] is returning a call from yesterday to [facility], please call Adam Savage @ [phone] ext [phone]

## 2018-04-27 DIAGNOSIS — I5032 Chronic diastolic (congestive) heart failure: Secondary | ICD-10-CM | POA: Diagnosis not present

## 2018-04-27 DIAGNOSIS — F0391 Unspecified dementia with behavioral disturbance: Secondary | ICD-10-CM | POA: Diagnosis not present

## 2018-04-27 DIAGNOSIS — J449 Chronic obstructive pulmonary disease, unspecified: Secondary | ICD-10-CM | POA: Diagnosis not present

## 2018-04-27 DIAGNOSIS — E1165 Type 2 diabetes mellitus with hyperglycemia: Secondary | ICD-10-CM | POA: Diagnosis not present

## 2018-04-27 DIAGNOSIS — M545 Low back pain: Secondary | ICD-10-CM | POA: Diagnosis not present

## 2018-04-27 DIAGNOSIS — J9621 Acute and chronic respiratory failure with hypoxia: Secondary | ICD-10-CM | POA: Diagnosis not present

## 2018-04-28 DIAGNOSIS — I5032 Chronic diastolic (congestive) heart failure: Secondary | ICD-10-CM | POA: Diagnosis not present

## 2018-04-28 DIAGNOSIS — J449 Chronic obstructive pulmonary disease, unspecified: Secondary | ICD-10-CM | POA: Diagnosis not present

## 2018-04-28 DIAGNOSIS — E1165 Type 2 diabetes mellitus with hyperglycemia: Secondary | ICD-10-CM | POA: Diagnosis not present

## 2018-04-28 DIAGNOSIS — M545 Low back pain: Secondary | ICD-10-CM | POA: Diagnosis not present

## 2018-04-28 DIAGNOSIS — J9621 Acute and chronic respiratory failure with hypoxia: Secondary | ICD-10-CM | POA: Diagnosis not present

## 2018-04-28 DIAGNOSIS — F0391 Unspecified dementia with behavioral disturbance: Secondary | ICD-10-CM | POA: Diagnosis not present

## 2018-04-29 DIAGNOSIS — J9621 Acute and chronic respiratory failure with hypoxia: Secondary | ICD-10-CM | POA: Diagnosis not present

## 2018-04-29 DIAGNOSIS — E1165 Type 2 diabetes mellitus with hyperglycemia: Secondary | ICD-10-CM | POA: Diagnosis not present

## 2018-04-29 DIAGNOSIS — J449 Chronic obstructive pulmonary disease, unspecified: Secondary | ICD-10-CM | POA: Diagnosis not present

## 2018-04-29 DIAGNOSIS — I5032 Chronic diastolic (congestive) heart failure: Secondary | ICD-10-CM | POA: Diagnosis not present

## 2018-04-29 DIAGNOSIS — F0391 Unspecified dementia with behavioral disturbance: Secondary | ICD-10-CM | POA: Diagnosis not present

## 2018-04-29 DIAGNOSIS — M545 Low back pain: Secondary | ICD-10-CM | POA: Diagnosis not present

## 2018-04-30 DIAGNOSIS — F0391 Unspecified dementia with behavioral disturbance: Secondary | ICD-10-CM | POA: Diagnosis not present

## 2018-04-30 DIAGNOSIS — I5032 Chronic diastolic (congestive) heart failure: Secondary | ICD-10-CM | POA: Diagnosis not present

## 2018-04-30 DIAGNOSIS — J9621 Acute and chronic respiratory failure with hypoxia: Secondary | ICD-10-CM | POA: Diagnosis not present

## 2018-04-30 DIAGNOSIS — J449 Chronic obstructive pulmonary disease, unspecified: Secondary | ICD-10-CM | POA: Diagnosis not present

## 2018-04-30 DIAGNOSIS — M545 Low back pain: Secondary | ICD-10-CM | POA: Diagnosis not present

## 2018-04-30 DIAGNOSIS — E1165 Type 2 diabetes mellitus with hyperglycemia: Secondary | ICD-10-CM | POA: Diagnosis not present

## 2018-04-30 DIAGNOSIS — Z7901 Long term (current) use of anticoagulants: Secondary | ICD-10-CM | POA: Diagnosis not present

## 2018-05-01 ENCOUNTER — Telehealth: Payer: Self-pay | Admitting: *Deleted

## 2018-05-01 ENCOUNTER — Ambulatory Visit (INDEPENDENT_AMBULATORY_CARE_PROVIDER_SITE_OTHER): Payer: Medicare Other | Admitting: Internal Medicine

## 2018-05-01 DIAGNOSIS — Z5181 Encounter for therapeutic drug level monitoring: Secondary | ICD-10-CM

## 2018-05-01 DIAGNOSIS — Z952 Presence of prosthetic heart valve: Secondary | ICD-10-CM | POA: Diagnosis not present

## 2018-05-01 DIAGNOSIS — I214 Non-ST elevation (NSTEMI) myocardial infarction: Secondary | ICD-10-CM

## 2018-05-01 LAB — PROTIME-INR: INR: 1.9 — AB (ref <=1.1)

## 2018-05-01 NOTE — Telephone Encounter (Signed)
Per Janett Billow @ [facility]   INR 1.89

## 2018-05-01 NOTE — Telephone Encounter (Signed)
Spoke with Janett Billow at [facility] & gave verbal orders and faxed over orders. Please refer to Anticoagulation Encounter for details.

## 2018-05-05 DIAGNOSIS — J449 Chronic obstructive pulmonary disease, unspecified: Secondary | ICD-10-CM | POA: Diagnosis not present

## 2018-05-05 DIAGNOSIS — F0391 Unspecified dementia with behavioral disturbance: Secondary | ICD-10-CM | POA: Diagnosis not present

## 2018-05-05 DIAGNOSIS — J9621 Acute and chronic respiratory failure with hypoxia: Secondary | ICD-10-CM | POA: Diagnosis not present

## 2018-05-05 DIAGNOSIS — I5032 Chronic diastolic (congestive) heart failure: Secondary | ICD-10-CM | POA: Diagnosis not present

## 2018-05-05 DIAGNOSIS — M545 Low back pain: Secondary | ICD-10-CM | POA: Diagnosis not present

## 2018-05-05 DIAGNOSIS — E1165 Type 2 diabetes mellitus with hyperglycemia: Secondary | ICD-10-CM | POA: Diagnosis not present

## 2018-05-06 DIAGNOSIS — F0391 Unspecified dementia with behavioral disturbance: Secondary | ICD-10-CM | POA: Diagnosis not present

## 2018-05-06 DIAGNOSIS — M545 Low back pain: Secondary | ICD-10-CM | POA: Diagnosis not present

## 2018-05-06 DIAGNOSIS — E1165 Type 2 diabetes mellitus with hyperglycemia: Secondary | ICD-10-CM | POA: Diagnosis not present

## 2018-05-06 DIAGNOSIS — I5032 Chronic diastolic (congestive) heart failure: Secondary | ICD-10-CM | POA: Diagnosis not present

## 2018-05-06 DIAGNOSIS — J9621 Acute and chronic respiratory failure with hypoxia: Secondary | ICD-10-CM | POA: Diagnosis not present

## 2018-05-06 DIAGNOSIS — J449 Chronic obstructive pulmonary disease, unspecified: Secondary | ICD-10-CM | POA: Diagnosis not present

## 2018-05-07 DIAGNOSIS — J9621 Acute and chronic respiratory failure with hypoxia: Secondary | ICD-10-CM | POA: Diagnosis not present

## 2018-05-07 DIAGNOSIS — I5032 Chronic diastolic (congestive) heart failure: Secondary | ICD-10-CM | POA: Diagnosis not present

## 2018-05-07 DIAGNOSIS — M545 Low back pain: Secondary | ICD-10-CM | POA: Diagnosis not present

## 2018-05-07 DIAGNOSIS — J449 Chronic obstructive pulmonary disease, unspecified: Secondary | ICD-10-CM | POA: Diagnosis not present

## 2018-05-07 DIAGNOSIS — F0391 Unspecified dementia with behavioral disturbance: Secondary | ICD-10-CM | POA: Diagnosis not present

## 2018-05-07 DIAGNOSIS — E1165 Type 2 diabetes mellitus with hyperglycemia: Secondary | ICD-10-CM | POA: Diagnosis not present

## 2018-05-12 ENCOUNTER — Ambulatory Visit (INDEPENDENT_AMBULATORY_CARE_PROVIDER_SITE_OTHER): Payer: Medicare Other

## 2018-05-12 ENCOUNTER — Encounter: Payer: Self-pay | Admitting: Orthopaedic Surgery

## 2018-05-12 ENCOUNTER — Ambulatory Visit (INDEPENDENT_AMBULATORY_CARE_PROVIDER_SITE_OTHER): Payer: Medicare Other | Admitting: Orthopaedic Surgery

## 2018-05-12 VITALS — BP 98/58 | HR 73 | Ht 72.0 in | Wt 267.0 lb

## 2018-05-12 DIAGNOSIS — G8929 Other chronic pain: Secondary | ICD-10-CM

## 2018-05-12 DIAGNOSIS — M545 Low back pain, unspecified: Secondary | ICD-10-CM

## 2018-05-12 NOTE — Progress Notes (Signed)
Subjective:    Patient ID: Adam Savage, male    DOB: 24-Sep-1950, 68 y.o.   MRN: [id number]  HPI He has lower back pain.  He says he has had it on and off for years.  It got worse last year and has progressed.  He denies any trauma.  He has had changes in his health over the last year.  He had right middle lobe pneumonia June, 2019.  He had encephalopathy and then a coronary graft in July 2019.  He is also post aortic valve replacement.  He is on coumadin. He developed congestive heart failure in September requiring admission to the hospital again.  In October he had internal bleeding, rectal bleeding and had to be hospitalized again. He was admitted to [facility] for a while and has been at [facility] and now at [facility].  He is better medically but his back is bothering him a lot now.   He has pain going down the right leg to the foot.  He has no weakness but has numbness.  He has no bowel or bladder problem.  He is very hard of hearing.    Review of Systems  Constitutional: Positive for activity change.  HENT: Positive for hearing loss.   Respiratory: Positive for cough and shortness of breath.   Cardiovascular: Positive for chest pain, palpitations and leg swelling.  Musculoskeletal: Positive for arthralgias and back pain.  Neurological: Positive for seizures.   For Review of Systems, all other systems reviewed and are negative.  The following is a summary of the past history medically, past history surgically, known current medicines, social history and family history.  This information is gathered electronically by the computer from prior information and documentation.  I review this each visit and have found including this information at this point in the chart is beneficial and informative.   Past Medical History:  Diagnosis Date  . COPD (chronic obstructive pulmonary disease) ([place])   . Diabetes mellitus without complication ([place])   . Mechanical heart  valve present   . Pre-diabetes   . Seizures ([place])     Past Surgical History:  Procedure Laterality Date  . CARDIAC SURGERY    . CHOLECYSTECTOMY    . COLON SURGERY    . CORONARY/GRAFT ANGIOGRAPHY N/A 10/31/2017   Procedure: CORONARY/GRAFT ANGIOGRAPHY;  Surgeon: Nelva Bush, MD;  Location: [facility];  Service: Cardiovascular;  Laterality: N/A;  . ESOPHAGOGASTRODUODENOSCOPY (EGD) WITH PROPOFOL N/A 01/18/2018   Procedure: ESOPHAGOGASTRODUODENOSCOPY (EGD) WITH PROPOFOL;  Surgeon: Mauri Pole, MD;  Location: [facility];  Service: Endoscopy;  Laterality: N/A;  . JOINT REPLACEMENT    . KNEE SURGERY Left     Current Outpatient Medications on File Prior to Visit  Medication Sig Dispense Refill  . Amino Acids-Protein Hydrolys (FEEDING SUPPLEMENT, PRO-STAT SUGAR FREE 64,) LIQD Take 30 mLs by mouth 2 (two) times daily. 900 mL 0  . atorvastatin (LIPITOR) 10 MG tablet Take 1 tablet (10 mg total) by mouth daily. 30 tablet 0  . carvedilol (COREG) 6.25 MG tablet     . escitalopram (LEXAPRO) 10 MG tablet     . furosemide (LASIX) 40 MG tablet     . insulin detemir (LEVEMIR) 100 UNIT/ML injection Inject 16 Units into the skin at bedtime.    . isosorbide mononitrate (IMDUR) 30 MG 24 hr tablet     . lactulose (CHRONULAC) 10 GM/15ML solution Take 30 mLs (20 g total) by mouth daily. 240 mL  0  . levETIRAcetam (KEPPRA) 1000 MG tablet   3  . memantine (NAMENDA) 5 MG tablet     . NOVOLOG 100 UNIT/ML injection     . oxyCODONE-acetaminophen (PERCOCET/ROXICET) 5-325 MG tablet     . pantoprazole (PROTONIX) 40 MG tablet Take 1 tablet (40 mg total) by mouth daily.    . QUEtiapine (SEROQUEL) 50 MG tablet     . warfarin (COUMADIN) 4 MG tablet     . albuterol (PROVENTIL) (2.5 MG/3ML) 0.083% nebulizer solution Take 3 mLs (2.5 mg total) by nebulization every 6 (six) hours as needed for wheezing or shortness of breath. (Patient not taking: Reported on 05/12/2018) 75 mL 12  . clotrimazole (LOTRIMIN) 1  % cream Apply 1 application topically 2 (two) times daily. (Patient not taking: Reported on 05/12/2018) 30 g 0  . ENULOSE 10 GM/15ML SOLN     . Hydrocortisone (GERHARDT'S BUTT CREAM) CREA Apply 1 application topically 3 (three) times daily. (Patient not taking: Reported on 05/12/2018)    . levETIRAcetam (KEPPRA) 100 MG/ML solution Take 10 mLs (1,000 mg total) by mouth 2 (two) times daily. (Patient not taking: Reported on 05/12/2018) 473 mL 12  . LORazepam (ATIVAN) 2 MG/ML injection Inject 0.25 mLs (0.5 mg total) into the vein every 4 (four) hours as needed for anxiety. (Patient not taking: Reported on 05/12/2018) 1 mL 0  . Maltodextrin-Xanthan Gum (RESOURCE THICKENUP CLEAR) POWD Take 120 g by mouth as needed. (Patient not taking: Reported on 05/12/2018)    . mouth rinse LIQD solution 15 mLs by Mouth Rinse route 2 (two) times daily. (Patient not taking: Reported on 05/12/2018)  0  . nicotine (NICODERM CQ - DOSED IN MG/24 HR) 7 mg/24hr patch Place 1 patch (7 mg total) onto the skin daily. (Patient not taking: Reported on 05/12/2018) 28 patch 0  . ramipril (ALTACE) 5 MG capsule   3  . ranitidine (ZANTAC) 150 MG tablet   3  . senna-docusate (SENOKOT-S) 8.6-50 MG tablet Take 1 tablet by mouth 2 (two) times daily. (Patient not taking: Reported on 05/12/2018)    . tamsulosin (FLOMAX) 0.4 MG CAPS capsule   3  . traZODone (DESYREL) 100 MG tablet   4   No current facility-administered medications on file prior to visit.     Social History   Socioeconomic History  . Marital status: Married    Spouse name: Not on file  . Number of children: Not on file  . Years of education: Not on file  . Highest education level: Not on file  Occupational History  . Not on file  Social Needs  . Financial resource strain: Not on file  . Food insecurity:    Worry: Not on file    Inability: Not on file  . Transportation needs:    Medical: Not on file    Non-medical: Not on file  Tobacco Use  . Smoking status: Current  Every Day Smoker    Packs/day: 1.00    Types: Cigarettes  . Smokeless tobacco: Never Used  Substance and Sexual Activity  . Alcohol use: Not Currently  . Drug use: Never  . Sexual activity: Not on file  Lifestyle  . Physical activity:    Days per week: Not on file    Minutes per session: Not on file  . Stress: Not on file  Relationships  . Social connections:    Talks on phone: Not on file    Gets together: Not on file    Attends religious  service: Not on file    Active member of club or organization: Not on file    Attends meetings of clubs or organizations: Not on file    Relationship status: Not on file  . Intimate partner violence:    Fear of current or ex partner: Not on file    Emotionally abused: Not on file    Physically abused: Not on file    Forced sexual activity: Not on file  Other Topics Concern  . Not on file  Social History Narrative  . Not on file    Family History  Problem Relation Age of Onset  . Hypertension Father     BP (!) 98/58   Pulse 73   Ht 6' (1.829 m)   Wt 267 lb (121.1 kg)   BMI 36.21 kg/m   Body mass index is 36.21 kg/m.     Objective:   Physical Exam Vitals signs reviewed.  Constitutional:      Appearance: He is well-developed.  HENT:     Head: Normocephalic and atraumatic.     Comments: Hard of hearing Eyes:     Conjunctiva/sclera: Conjunctivae normal.     Pupils: Pupils are equal, round, and reactive to light.  Neck:     Musculoskeletal: Normal range of motion and neck supple.  Cardiovascular:     Rate and Rhythm: Normal rate and regular rhythm.  Pulmonary:     Effort: Pulmonary effort is normal.  Abdominal:     Palpations: Abdomen is soft.  Musculoskeletal:       Back:  Skin:    General: Skin is warm and dry.  Neurological:     Mental Status: He is alert and oriented to person, place, and time.     Cranial Nerves: No cranial nerve deficit.     Motor: No abnormal muscle tone.     Coordination: Coordination  normal.     Deep Tendon Reflexes: Reflexes are normal and symmetric. Reflexes normal.  Psychiatric:        Behavior: Behavior normal.        Thought Content: Thought content normal.        Judgment: Judgment normal.      X-rays were done of the lumbar spine, reported separately.  He has old compression fractures of L1 and L3.     Assessment & Plan:   Encounter Diagnosis  Name Primary?  . Chronic midline low back pain without sciatica Yes   I will get a MRI of the lumbar spine.  He denies any trauma but has compression fractures.  I am also concerned about HNP.  Continue present medicines.  I completed forms for [facility].  Return after MRI.  Call if any problem.  Precautions discussed.   Electronically Signed Sanjuana Kava, MD 1/28/202011:09 AM

## 2018-05-12 NOTE — Patient Instructions (Signed)
Steps to Quit Smoking    Smoking tobacco can be bad for your health. It can also affect almost every organ in your body. Smoking puts you and people around you at risk for many serious long-lasting (chronic) diseases. Quitting smoking is hard, but it is one of the best things that you can do for your health. It is never too late to quit.  What are the benefits of quitting smoking?  When you quit smoking, you lower your risk for getting serious diseases and conditions. They can include:  · Lung cancer or lung disease.  · Heart disease.  · Stroke.  · Heart attack.  · Not being able to have children (infertility).  · Weak bones (osteoporosis) and broken bones (fractures).  If you have coughing, wheezing, and shortness of breath, those symptoms may get better when you quit. You may also get sick less often. If you are pregnant, quitting smoking can help to lower your chances of having a baby of low birth weight.  What can I do to help me quit smoking?  Talk with your doctor about what can help you quit smoking. Some things you can do (strategies) include:  · Quitting smoking totally, instead of slowly cutting back how much you smoke over a period of time.  · Going to in-person counseling. You are more likely to quit if you go to many counseling sessions.  · Using resources and support systems, such as:  ? Online chats with a counselor.  ? Phone quitlines.  ? Printed self-help materials.  ? Support groups or group counseling.  ? Text messaging programs.  ? Mobile phone apps or applications.  · Taking medicines. Some of these medicines may have nicotine in them. If you are pregnant or breastfeeding, do not take any medicines to quit smoking unless your doctor says it is okay. Talk with your doctor about counseling or other things that can help you.  Talk with your doctor about using more than one strategy at the same time, such as taking medicines while you are also going to in-person counseling. This can help make  quitting easier.  What things can I do to make it easier to quit?  Quitting smoking might feel very hard at first, but there is a lot that you can do to make it easier. Take these steps:  · Talk to your family and friends. Ask them to support and encourage you.  · Call phone quitlines, reach out to support groups, or work with a counselor.  · Ask people who smoke to not smoke around you.  · Avoid places that make you want (trigger) to smoke, such as:  ? Bars.  ? Parties.  ? Smoke-break areas at work.  · Spend time with people who do not smoke.  · Lower the stress in your life. Stress can make you want to smoke. Try these things to help your stress:  ? Getting regular exercise.  ? Deep-breathing exercises.  ? Yoga.  ? Meditating.  ? Doing a body scan. To do this, close your eyes, focus on one area of your body at a time from head to toe, and notice which parts of your body are tense. Try to relax the muscles in those areas.  · Download or buy apps on your mobile phone or tablet that can help you stick to your quit plan. There are many free apps, such as QuitGuide from the CDC (Centers for Disease Control and Prevention). You can find more   support from smokefree.gov and other websites.  This information is not intended to replace advice given to you by your health care provider. Make sure you discuss any questions you have with your health care provider.  Document Released: 01/26/2009 Document Revised: 11/28/2015 Document Reviewed: 08/16/2014  Elsevier Interactive Patient Education © 2019 [employer] Inc.

## 2018-05-20 ENCOUNTER — Ambulatory Visit (HOSPITAL_COMMUNITY)
Admission: RE | Admit: 2018-05-20 | Discharge: 2018-05-20 | Disposition: A | Discharge status: Home / Self Care | Payer: Medicare Other | Source: Ambulatory Visit | Attending: Orthopaedic Surgery | Admitting: Orthopaedic Surgery

## 2018-05-20 ENCOUNTER — Ambulatory Visit (HOSPITAL_COMMUNITY): Payer: Medicare Other

## 2018-05-20 DIAGNOSIS — G8929 Other chronic pain: Secondary | ICD-10-CM | POA: Insufficient documentation

## 2018-05-20 DIAGNOSIS — M545 Low back pain, unspecified: Secondary | ICD-10-CM

## 2018-05-20 DIAGNOSIS — S32010A Wedge compression fracture of first lumbar vertebra, initial encounter for closed fracture: Secondary | ICD-10-CM | POA: Diagnosis not present

## 2018-05-21 ENCOUNTER — Telehealth: Payer: Self-pay

## 2018-05-21 NOTE — Telephone Encounter (Signed)
Called daughter of patient to inform her of patients fracture as well as let her know that patient will need to be fitted for a brace, verified with provider if the fitting can wait until patient appointment 05/26/18 and stated that was ok. Daughter of patient called back stating patient has been in tremendous pain and is requesting his oxycodone ever 4-6 hour, but medication is written for every 8 hours. Patient daughter would like to know if anything else can be done to help manage his pain. Please advise.

## 2018-05-25 NOTE — Telephone Encounter (Signed)
[place], calling from [phone], about the order for medication(Oxycodone) and inquiring about dosage change?

## 2018-05-26 ENCOUNTER — Ambulatory Visit (INDEPENDENT_AMBULATORY_CARE_PROVIDER_SITE_OTHER): Payer: Medicare Other | Admitting: Orthopaedic Surgery

## 2018-05-26 ENCOUNTER — Encounter: Payer: Self-pay | Admitting: Orthopaedic Surgery

## 2018-05-26 VITALS — BP 99/54 | HR 73 | Ht 72.0 in | Wt 267.0 lb

## 2018-05-26 DIAGNOSIS — S32010A Wedge compression fracture of first lumbar vertebra, initial encounter for closed fracture: Secondary | ICD-10-CM

## 2018-05-26 DIAGNOSIS — M545 Low back pain, unspecified: Secondary | ICD-10-CM

## 2018-05-26 DIAGNOSIS — G8929 Other chronic pain: Secondary | ICD-10-CM | POA: Diagnosis not present

## 2018-05-26 NOTE — Progress Notes (Signed)
Patient Adam Savage, male DOB:[birth year], 69 y.o. LHT:[id number]  Chief Complaint  Patient presents with  . Back Pain  . Results    review MRI     HPI  Adam Savage is a 68 y.o. male who has back pain.  He had the MRI which showed: IMPRESSION: Acute or subacute superior endplate fracture at L1 with loss of height anteriorly of 20%. No retropulsed bone. This looks like a benign osteoporotic fracture.  Healed superior endplate fracture at L3 with loss of height of 10-20%. This was not present in October of 2019.  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 Symptoms of spinal stenosis could derive from the findings at this level. Facet joint edema could also be associated with back pain or referred facet syndrome pain.  I have explained to him and his daughter the findings.  I have fitted him with a CASH brace today.    He will need to wear the brace in the rest home all the time except when laying down or taking bath.   Body mass index is 36.21 kg/m.  ROS  Review of Systems  Constitutional: Positive for activity change.  HENT: Positive for hearing loss.   Respiratory: Positive for cough and shortness of breath.   Cardiovascular: Positive for chest pain, palpitations and leg swelling.  Musculoskeletal: Positive for arthralgias and back pain.  Neurological: Positive for seizures.    All other systems reviewed and are negative.  The following is a summary of the past history medically, past history surgically, known current medicines, social history and family history.  This information is gathered electronically by the computer from prior information and documentation.  I review this each visit and have found including this information at this point in the chart is beneficial and informative.    Past Medical History:  Diagnosis Date  . COPD (chronic obstructive pulmonary  disease) ([place])   . Diabetes mellitus without complication ([place])   . Mechanical heart valve present   . Pre-diabetes   . Seizures ([place])     Past Surgical History:  Procedure Laterality Date  . CARDIAC SURGERY    . CHOLECYSTECTOMY    . COLON SURGERY    . CORONARY/GRAFT ANGIOGRAPHY N/A 10/31/2017   Procedure: CORONARY/GRAFT ANGIOGRAPHY;  Surgeon: Nelva Bush, MD;  Location: [facility];  Service: Cardiovascular;  Laterality: N/A;  . ESOPHAGOGASTRODUODENOSCOPY (EGD) WITH PROPOFOL N/A 01/18/2018   Procedure: ESOPHAGOGASTRODUODENOSCOPY (EGD) WITH PROPOFOL;  Surgeon: Mauri Pole, MD;  Location: [facility];  Service: Endoscopy;  Laterality: N/A;  . JOINT REPLACEMENT    . KNEE SURGERY Left     Family History  Problem Relation Age of Onset  . Hypertension Father     Social History Social History   Tobacco Use  . Smoking status: Current Every Day Smoker    Packs/day: 1.00    Types: Cigarettes  . Smokeless tobacco: Never Used  Substance Use Topics  . Alcohol use: Not Currently  . Drug use: Never    Allergies  Allergen Reactions  . Morphine And Related Other (See Comments)    Combative     Current Outpatient Medications  Medication Sig Dispense Refill  . Amino Acids-Protein Hydrolys (FEEDING SUPPLEMENT, PRO-STAT SUGAR FREE 64,) LIQD Take 30 mLs by mouth 2 (two) times daily. 900 mL 0  . atorvastatin (LIPITOR) 10 MG tablet     . carvedilol (COREG) 6.25 MG tablet     .  ENULOSE 10 GM/15ML SOLN     . escitalopram (LEXAPRO) 10 MG tablet     . furosemide (LASIX) 40 MG tablet     . insulin detemir (LEVEMIR) 100 UNIT/ML injection Inject 16 Units into the skin at bedtime.    . isosorbide mononitrate (IMDUR) 30 MG 24 hr tablet     . lactulose (CHRONULAC) 10 GM/15ML solution Take 30 mLs (20 g total) by mouth daily. 240 mL 0  . levETIRAcetam (KEPPRA) 1000 MG tablet   3  . memantine (NAMENDA) 5 MG tablet     . NOVOLOG 100 UNIT/ML injection     .  oxyCODONE-acetaminophen (PERCOCET/ROXICET) 5-325 MG tablet     . pantoprazole (PROTONIX) 40 MG tablet Take 1 tablet (40 mg total) by mouth daily.    . QUEtiapine (SEROQUEL) 50 MG tablet     . ramipril (ALTACE) 5 MG capsule   3  . ranitidine (ZANTAC) 150 MG tablet   3  . tamsulosin (FLOMAX) 0.4 MG CAPS capsule   3  . traZODone (DESYREL) 100 MG tablet   4  . warfarin (COUMADIN) 4 MG tablet     . albuterol (PROVENTIL) (2.5 MG/3ML) 0.083% nebulizer solution Take 3 mLs (2.5 mg total) by nebulization every 6 (six) hours as needed for wheezing or shortness of breath. (Patient not taking: Reported on 05/12/2018) 75 mL 12  . clotrimazole (LOTRIMIN) 1 % cream Apply 1 application topically 2 (two) times daily. (Patient not taking: Reported on 05/12/2018) 30 g 0  . Hydrocortisone (GERHARDT'S BUTT CREAM) CREA Apply 1 application topically 3 (three) times daily. (Patient not taking: Reported on 05/12/2018)    . levETIRAcetam (KEPPRA) 100 MG/ML solution Take 10 mLs (1,000 mg total) by mouth 2 (two) times daily. (Patient not taking: Reported on 05/12/2018) 473 mL 12  . LORazepam (ATIVAN) 2 MG/ML injection Inject 0.25 mLs (0.5 mg total) into the vein every 4 (four) hours as needed for anxiety. (Patient not taking: Reported on 05/12/2018) 1 mL 0  . Maltodextrin-Xanthan Gum (RESOURCE THICKENUP CLEAR) POWD Take 120 g by mouth as needed. (Patient not taking: Reported on 05/12/2018)    . mouth rinse LIQD solution 15 mLs by Mouth Rinse route 2 (two) times daily. (Patient not taking: Reported on 05/12/2018)  0  . nicotine (NICODERM CQ - DOSED IN MG/24 HR) 7 mg/24hr patch Place 1 patch (7 mg total) onto the skin daily. (Patient not taking: Reported on 05/12/2018) 28 patch 0  . senna-docusate (SENOKOT-S) 8.6-50 MG tablet Take 1 tablet by mouth 2 (two) times daily. (Patient not taking: Reported on 05/12/2018)     No current facility-administered medications for this visit.      Physical Exam  Blood pressure (!) 99/54, pulse 73,  height 6' (1.829 m), weight 267 lb (121.1 kg).  Constitutional: overall normal hygiene, normal nutrition, well developed, normal grooming, normal body habitus. Assistive device:cane  Musculoskeletal: gait and station Limp right, muscle tone and strength are normal, no tremors or atrophy is present.  .  Neurological: coordination overall normal.  Deep tendon reflex/nerve stretch intact.  Sensation normal.  Cranial nerves II-XII intact.   Skin:   Normal overall no scars, lesions, ulcers or rashes. No psoriasis.  Psychiatric: Alert and oriented x 3.  Recent memory intact, remote memory unclear.  Normal mood and affect. Well groomed.  Good eye contact.  Cardiovascular: overall no swelling, no varicosities, no edema bilaterally, normal temperatures of the legs and arms, no clubbing, cyanosis and good capillary refill.  Lymphatic:  palpation is normal.  He had tenderness of the area of L1 with no spasm.  NV intact.  All other systems reviewed and are negative   The patient has been educated about the nature of the problem(s) and counseled on treatment options.  The patient appeared to understand what I have discussed and is in agreement with it.  Encounter Diagnoses  Name Primary?  . Compression fracture of L1 vertebra, initial encounter ([place]) Yes  . Chronic midline low back pain without sciatica     PLAN Call if any problems.  Precautions discussed.  Continue current medications.   Form for rest home completed.  CASH brace given and fitted.  Return to clinic 1 week   Electronically Signed Sanjuana Kava, MD 2/11/202010:26 AM

## 2018-05-27 DIAGNOSIS — K219 Gastro-esophageal reflux disease without esophagitis: Secondary | ICD-10-CM | POA: Diagnosis not present

## 2018-05-27 DIAGNOSIS — M545 Low back pain: Secondary | ICD-10-CM | POA: Diagnosis not present

## 2018-05-27 DIAGNOSIS — I10 Essential (primary) hypertension: Secondary | ICD-10-CM | POA: Diagnosis not present

## 2018-05-29 ENCOUNTER — Other Ambulatory Visit: Payer: Self-pay

## 2018-05-29 ENCOUNTER — Encounter

## 2018-05-29 ENCOUNTER — Ambulatory Visit (INDEPENDENT_AMBULATORY_CARE_PROVIDER_SITE_OTHER): Payer: Medicare Other | Admitting: Neurology

## 2018-05-29 ENCOUNTER — Encounter: Payer: Self-pay | Admitting: Neurology

## 2018-05-29 VITALS — BP 110/58 | HR 71 | Ht 72.0 in | Wt 267.0 lb

## 2018-05-29 DIAGNOSIS — G40009 Localization-related (focal) (partial) idiopathic epilepsy and epileptic syndromes with seizures of localized onset, not intractable, without status epilepticus: Secondary | ICD-10-CM

## 2018-05-29 DIAGNOSIS — F0281 Dementia in other diseases classified elsewhere with behavioral disturbance: Secondary | ICD-10-CM | POA: Diagnosis not present

## 2018-05-29 DIAGNOSIS — F02818 Dementia in other diseases classified elsewhere, unspecified severity, with other behavioral disturbance: Secondary | ICD-10-CM

## 2018-05-29 NOTE — Patient Instructions (Addendum)
1. Continue all your medications 2. Follow-up in 3-4 months, call for any changes  FALL PRECAUTIONS: Be cautious when walking. Scan the area for obstacles that may increase the risk of trips and falls. When getting up in the mornings, sit up at the edge of the bed for a few minutes before getting out of bed. Consider elevating the bed at the head end to avoid drop of blood pressure when getting up. Walk always in a well-lit room (use night lights in the walls). Avoid area rugs or power cords from appliances in the middle of the walkways. Use a walker or a cane if necessary and consider physical therapy for balance exercise. Get your eyesight checked regularly.  HOME SAFETY: Consider the safety of the kitchen when operating appliances like stoves, microwave oven, and blender. Consider having supervision and share cooking responsibilities until no longer able to participate in those. Accidents with firearms and other hazards in the house should be identified and addressed as well.  ABILITY TO BE LEFT ALONE: If patient is unable to contact 911 operator, consider using LifeLine, or when the need is there, arrange for someone to stay with patients. Smoking is a fire hazard, consider supervision or cessation. Risk of wandering should be assessed by caregiver and if detected at any point, supervision and safe proof recommendations should be instituted.  MEDICATION SUPERVISION: Inability to self-administer medication needs to be constantly addressed. Implement a mechanism to ensure safe administration of the medications.  RECOMMENDATIONS FOR ALL PATIENTS WITH MEMORY PROBLEMS: 1. Continue to exercise (Recommend 30 minutes of walking everyday, or 3 hours every week) 2. Increase social interactions - continue going to [place] and enjoy social gatherings with friends and family 3. Eat healthy, avoid fried foods and eat more fruits and vegetables 4. Maintain adequate blood pressure, blood sugar, and blood  cholesterol level. Reducing the risk of stroke and cardiovascular disease also helps promoting better memory. 5. Avoid stressful situations. Live a simple life and avoid aggravations. Organize your time and prepare for the next day in anticipation. 6. Sleep well, avoid any interruptions of sleep and avoid any distractions in the bedroom that may interfere with adequate sleep quality 7. Avoid sugar, avoid sweets as there is a strong link between excessive sugar intake, diabetes, and cognitive impairment The Mediterranean diet has been shown to help patients reduce the risk of progressive memory disorders and reduces cardiovascular risk. This includes eating fish, eat fruits and green leafy vegetables, nuts like almonds and hazelnuts, walnuts, and also use olive oil. Avoid fast foods and fried foods as much as possible. Avoid sweets and sugar as sugar use has been linked to worsening of memory function.  There is always a concern of gradual progression of memory problems. If this is the case, then we may need to adjust level of care according to patient needs. Support, both to the patient and caregiver, should then be put into place.
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
## 2018-06-02 ENCOUNTER — Ambulatory Visit (INDEPENDENT_AMBULATORY_CARE_PROVIDER_SITE_OTHER): Payer: Medicare Other | Admitting: Orthopaedic Surgery

## 2018-06-02 ENCOUNTER — Encounter: Payer: Self-pay | Admitting: Orthopaedic Surgery

## 2018-06-02 DIAGNOSIS — S32010A Wedge compression fracture of first lumbar vertebra, initial encounter for closed fracture: Secondary | ICD-10-CM

## 2018-06-02 NOTE — Progress Notes (Signed)
CC:  I don't like the brace  He has had problems with the CASH brace, more in sitting.  He has less pain.  NV intact.  Encounter Diagnosis  Name Primary?  . Compression fracture of L1 vertebra, initial encounter ([place]) Yes   The brace is adjusted.  Return in two weeks.  X-rays on return, lateral lumbar spine.  Electronically Signed Sanjuana Kava, MD 2/18/202010:48 AM

## 2018-06-02 NOTE — Patient Instructions (Signed)
Steps to Quit Smoking    Smoking tobacco can be bad for your health. It can also affect almost every organ in your body. Smoking puts you and people around you at risk for many serious long-lasting (chronic) diseases. Quitting smoking is hard, but it is one of the best things that you can do for your health. It is never too late to quit.  What are the benefits of quitting smoking?  When you quit smoking, you lower your risk for getting serious diseases and conditions. They can include:  · Lung cancer or lung disease.  · Heart disease.  · Stroke.  · Heart attack.  · Not being able to have children (infertility).  · Weak bones (osteoporosis) and broken bones (fractures).  If you have coughing, wheezing, and shortness of breath, those symptoms may get better when you quit. You may also get sick less often. If you are pregnant, quitting smoking can help to lower your chances of having a baby of low birth weight.  What can I do to help me quit smoking?  Talk with your doctor about what can help you quit smoking. Some things you can do (strategies) include:  · Quitting smoking totally, instead of slowly cutting back how much you smoke over a period of time.  · Going to in-person counseling. You are more likely to quit if you go to many counseling sessions.  · Using resources and support systems, such as:  ? Online chats with a counselor.  ? Phone quitlines.  ? Printed self-help materials.  ? Support groups or group counseling.  ? Text messaging programs.  ? Mobile phone apps or applications.  · Taking medicines. Some of these medicines may have nicotine in them. If you are pregnant or breastfeeding, do not take any medicines to quit smoking unless your doctor says it is okay. Talk with your doctor about counseling or other things that can help you.  Talk with your doctor about using more than one strategy at the same time, such as taking medicines while you are also going to in-person counseling. This can help make  quitting easier.  What things can I do to make it easier to quit?  Quitting smoking might feel very hard at first, but there is a lot that you can do to make it easier. Take these steps:  · Talk to your family and friends. Ask them to support and encourage you.  · Call phone quitlines, reach out to support groups, or work with a counselor.  · Ask people who smoke to not smoke around you.  · Avoid places that make you want (trigger) to smoke, such as:  ? Bars.  ? Parties.  ? Smoke-break areas at work.  · Spend time with people who do not smoke.  · Lower the stress in your life. Stress can make you want to smoke. Try these things to help your stress:  ? Getting regular exercise.  ? Deep-breathing exercises.  ? Yoga.  ? Meditating.  ? Doing a body scan. To do this, close your eyes, focus on one area of your body at a time from head to toe, and notice which parts of your body are tense. Try to relax the muscles in those areas.  · Download or buy apps on your mobile phone or tablet that can help you stick to your quit plan. There are many free apps, such as QuitGuide from the CDC (Centers for Disease Control and Prevention). You can find more   support from smokefree.gov and other websites.  This information is not intended to replace advice given to you by your health care provider. Make sure you discuss any questions you have with your health care provider.  Document Released: 01/26/2009 Document Revised: 11/28/2015 Document Reviewed: 08/16/2014  Elsevier Interactive Patient Education © 2019 [employer] Inc.

## 2018-06-04 ENCOUNTER — Encounter

## 2018-06-04 ENCOUNTER — Ambulatory Visit (INDEPENDENT_AMBULATORY_CARE_PROVIDER_SITE_OTHER): Payer: Medicare Other | Admitting: Interventional Cardiology

## 2018-06-04 ENCOUNTER — Encounter: Payer: Self-pay | Admitting: Interventional Cardiology

## 2018-06-04 ENCOUNTER — Telehealth: Payer: Self-pay | Admitting: *Deleted

## 2018-06-04 VITALS — BP 110/60 | HR 75 | Ht 72.0 in | Wt 276.4 lb

## 2018-06-04 DIAGNOSIS — I252 Old myocardial infarction: Secondary | ICD-10-CM

## 2018-06-04 DIAGNOSIS — Z7901 Long term (current) use of anticoagulants: Secondary | ICD-10-CM

## 2018-06-04 DIAGNOSIS — G4733 Obstructive sleep apnea (adult) (pediatric): Secondary | ICD-10-CM | POA: Diagnosis not present

## 2018-06-04 DIAGNOSIS — Z952 Presence of prosthetic heart valve: Secondary | ICD-10-CM

## 2018-06-04 LAB — BASIC METABOLIC PANEL
BUN/Creatinine Ratio: 25 — ABNORMAL HIGH (ref 10–24)
BUN: 27 mg/dL (ref 8–27)
CO2: 30 mmol/L — ABNORMAL HIGH (ref 20–29)
Calcium: 8.9 mg/dL (ref 8.6–10.2)
Chloride: 103 mmol/L (ref 96–106)
Creatinine, Ser: 1.1 mg/dL (ref 0.76–1.27)
GFR calc Af Amer: 79 mL/min/{1.73_m2} (ref >59)
GFR calc non Af Amer: 69 mL/min/{1.73_m2} (ref >59)
Glucose: 178 mg/dL — ABNORMAL HIGH (ref 65–99)
Potassium: 4.5 mmol/L (ref 3.5–5.2)
Sodium: 138 mmol/L (ref 134–144)

## 2018-06-04 LAB — PROTIME-INR
INR: 1.8 — ABNORMAL HIGH (ref 0.8–1.2)
Prothrombin Time: 18.2 s — ABNORMAL HIGH (ref 9.1–12.0)

## 2018-06-04 NOTE — Patient Instructions (Signed)
Medication Instructions:  Your physician recommends that you continue on your current medications as directed. Please refer to the Current Medication list given to you today.  If you need a refill on your cardiac medications before your next appointment, please call your pharmacy.   Lab work: TODAY: BMET, INR  If you have labs (blood work) drawn today and your tests are completely normal, you will receive your results only by: Marland Kitchen MyChart Message (if you have MyChart) OR . A paper copy in the mail If you have any lab test that is abnormal or we need to change your treatment, we will call you to review the results.  Testing/Procedures: None ordered  Follow-Up: At [facility], you and your health needs are our priority.  As part of our continuing mission to provide you with exceptional heart care, we have created designated Provider Care Teams.  These Care Teams include your primary Cardiologist (physician) and Advanced Practice Providers (APPs -  Physician Assistants and Nurse Practitioners) who all work together to provide you with the care you need, when you need it. . You will need a follow up appointment in 6 months.  Please call our office 2 months in advance to schedule this appointment.  You may see Casandra Doffing, MD or one of the following Advanced Practice Providers on your designated Care Team:   . Lyda Jester, PA-C . Dayna Dunn, PA-C . Ermalinda Barrios, PA-C  Any Other Special Instructions Will Be Listed Below (If Applicable).

## 2018-06-04 NOTE — Telephone Encounter (Signed)
Spoke with Sharyn Lull with [facility] in [place] and she stated that their facility Physician is managing the pt's INR at this time. Once the pt is discharged from their facility the pt will have to resume management in the office. They will instruct the pt and family to follow up when the time approaches. She states it may be in March 2020.

## 2018-06-04 NOTE — Progress Notes (Signed)
Cardiology Office Note   Date:  06/04/2018   ID:  Adam Savage, DOB [birth year], MRN [id number]  PCP:  System, Pcp Not In    No chief complaint on file.  AVR  Wt Readings from Last 3 Encounters:  05/29/18 267 lb (121.1 kg)  05/26/18 267 lb (121.1 kg)  05/12/18 267 lb (121.1 kg)       History of Present Illness: Adam Savage is a 68 y.o. male  With a h/o mechanical aortic valve replacement.  He was hospitalized for NSTEMI in 7/19.    I spoke to the daughter in the hospoital and here was the summary of his social situation: "I had a long discussion with the patient's daughter. He has had a difficult time now for several months. The patient's wife had to go stay with her parents to take care of them in their aging years. The husband was left alone. The daughter visited and realized quickly that the patient was unable to care for himself. He has issues with compliance with medications. He has issues with hygiene. He has not been using CPAP. He has irregular sleep patterns per the daughter. She is also concerned about his memory, and thinks the problems may be related to his prior brain surgery. She thinks he show signs of dementia.   The daughter brought her father to live with [place], from [place]. She has had a difficult time, particularly in the last few days. She will certainly need some assistance from case managers. "  He ultimately did undergo cardiac catheterization.  There was a lesion in the distal LAD which is thought to be perhaps from embolism from his valve.  He had a subtherapeutic INR.  Following with neuro due to seizure disorder ad dementia.   In 10/19, he had a PEA arest from hemorrhagic shock.  He had some internal bleeding and a Mallory-Weiss tear.  He has been in rehab/[facility].  INR has ben checked at his current facility.  INR has ben steady.   Exercise limited by back pain.  Wearing a back brace at times.  Walks only for  meals.  Denies : Chest pain. Dizziness. Leg edema. Nitroglycerin use. Orthopnea. Palpitations. Paroxysmal nocturnal dyspnea. Shortness of breath. Syncope.   He is doing much better.  Currently staying at [facility] and will be going back home to live with his daughter soon.      Past Medical History:  Diagnosis Date  . COPD (chronic obstructive pulmonary disease) ([place])   . Diabetes mellitus without complication ([place])   . Mechanical heart valve present   . Pre-diabetes   . Seizures ([place])     Past Surgical History:  Procedure Laterality Date  . CARDIAC SURGERY    . CHOLECYSTECTOMY    . COLON SURGERY    . CORONARY/GRAFT ANGIOGRAPHY N/A 10/31/2017   Procedure: CORONARY/GRAFT ANGIOGRAPHY;  Surgeon: Nelva Bush, MD;  Location: [facility];  Service: Cardiovascular;  Laterality: N/A;  . ESOPHAGOGASTRODUODENOSCOPY (EGD) WITH PROPOFOL N/A 01/18/2018   Procedure: ESOPHAGOGASTRODUODENOSCOPY (EGD) WITH PROPOFOL;  Surgeon: Mauri Pole, MD;  Location: [facility];  Service: Endoscopy;  Laterality: N/A;  . JOINT REPLACEMENT    . KNEE SURGERY Left      Current Outpatient Medications  Medication Sig Dispense Refill  . albuterol (PROVENTIL) (2.5 MG/3ML) 0.083% nebulizer solution Take 3 mLs (2.5 mg total) by nebulization every 6 (six) hours as needed for wheezing or shortness of breath. 75 mL 12  . Amino  Acids-Protein Hydrolys (FEEDING SUPPLEMENT, PRO-STAT SUGAR FREE 64,) LIQD Take 30 mLs by mouth 2 (two) times daily. 900 mL 0  . atorvastatin (LIPITOR) 10 MG tablet     . carvedilol (COREG) 6.25 MG tablet     . clotrimazole (LOTRIMIN) 1 % cream Apply 1 application topically 2 (two) times daily. 30 g 0  . ENULOSE 10 GM/15ML SOLN     . escitalopram (LEXAPRO) 10 MG tablet     . furosemide (LASIX) 40 MG tablet     . Hydrocortisone (GERHARDT'S BUTT CREAM) CREA Apply 1 application topically 3 (three) times daily.    . insulin detemir (LEVEMIR) 100 UNIT/ML injection Inject 16 Units  into the skin at bedtime.    . isosorbide mononitrate (IMDUR) 30 MG 24 hr tablet     . lactulose (CHRONULAC) 10 GM/15ML solution Take 30 mLs (20 g total) by mouth daily. 240 mL 0  . levETIRAcetam (KEPPRA) 1000 MG tablet   3  . LORazepam (ATIVAN) 2 MG/ML injection Inject 0.25 mLs (0.5 mg total) into the vein every 4 (four) hours as needed for anxiety. 1 mL 0  . Maltodextrin-Xanthan Gum (RESOURCE THICKENUP CLEAR) POWD Take 120 g by mouth as needed.    . memantine (NAMENDA) 5 MG tablet     . mouth rinse LIQD solution 15 mLs by Mouth Rinse route 2 (two) times daily.  0  . nicotine (NICODERM CQ - DOSED IN MG/24 HR) 7 mg/24hr patch Place 1 patch (7 mg total) onto the skin daily. 28 patch 0  . NOVOLOG 100 UNIT/ML injection     . oxyCODONE-acetaminophen (PERCOCET/ROXICET) 5-325 MG tablet     . pantoprazole (PROTONIX) 40 MG tablet Take 1 tablet (40 mg total) by mouth daily.    . QUEtiapine (SEROQUEL) 50 MG tablet 2 (two) times daily.     . ranitidine (ZANTAC) 150 MG tablet   3  . senna-docusate (SENOKOT-S) 8.6-50 MG tablet Take 1 tablet by mouth 2 (two) times daily.    . tamsulosin (FLOMAX) 0.4 MG CAPS capsule   3  . warfarin (COUMADIN) 4 MG tablet      No current facility-administered medications for this visit.     Allergies:   Morphine and related    Social History:  The patient  reports that he has been smoking cigarettes. He has been smoking about 1.00 pack per day. He has never used smokeless tobacco. He reports previous alcohol use. He reports that he does not use drugs.   Family History:  The patient's family history includes Hypertension in his father.    ROS:  Please see the history of present illness.   Otherwise, review of systems are positive for back pain.   All other systems are reviewed and negative.    PHYSICAL EXAM: VS:  Ht 6' (1.829 m)   BMI 36.21 kg/m  , BMI Body mass index is 36.21 kg/m. GEN: Well nourished, well developed, in no acute distress  HEENT: normal  Neck:  no JVD, carotid bruits, or masses Cardiac: RRR; 1/6 systoic murmur, crisp S2, no rubs, or gallops,no edema  Respiratory:  clear to auscultation bilaterally, normal work of breathing GI: soft, nontender, nondistended, + BS MS: no deformity or atrophy  Skin: warm and dry, no rash Neuro:  Strength and sensation are intact Psych: euthymic mood, full affect     Recent Labs: 12/21/2017: B Natriuretic Peptide 425.3 01/22/2018: ALT 88 01/23/2018: TSH 3.696 01/27/2018: Magnesium 1.9 01/30/2018: BUN 28; Creatinine, Ser 1.20; Hemoglobin 10.7;  Platelets 166; Potassium 3.6; Sodium 141   Lipid Panel    Component Value Date/Time   CHOL 110 11/02/2017 0441   TRIG 69 11/02/2017 0441   HDL 36 (L) 11/02/2017 0441   CHOLHDL 3.1 11/02/2017 0441   VLDL 14 11/02/2017 0441   LDLCALC 60 11/02/2017 0441     Other studies Reviewed: Additional studies/ records that were reviewed today with results demonstrating: Hospital records reviewed.   ASSESSMENT AND PLAN:  1. AVR: Needs INR checked regularly.  WIll check today. 2. Anticoagulated: Checked at [place].  Avoid INR over 3 given bleeding issues.  3. OSA: Followed by Dr. Radford Pax. 4. Obesity:  Weight stable. Increase activity. 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  Check INR today.  He has not been getting INR checked regularly. Appears euvolemic.  EF 55-60% in 7/19.   Current medicines are reviewed at length with the patient today.  The patient concerns regarding his medicines were addressed.  The following changes have been made:  No change  Labs/ tests ordered today include:  No orders of the defined types were placed in this encounter.   Recommend 150 minutes/week of aerobic exercise Low fat, low carb, high fiber diet recommended  Disposition:   FU in 6 months   Signed, Larae Grooms, MD  06/04/2018 10:11 AM    [facility] [address], [place], [place]  [address] Phone: [phone]; Fax: [phone]

## 2018-06-05 ENCOUNTER — Encounter: Payer: Self-pay | Admitting: Neurology

## 2018-06-16 ENCOUNTER — Ambulatory Visit: Payer: Medicare Other | Admitting: Orthopaedic Surgery

## 2018-06-16 NOTE — Telephone Encounter (Signed)
This encounter was created in error - please disregard.

## 2018-06-17 ENCOUNTER — Ambulatory Visit (INDEPENDENT_AMBULATORY_CARE_PROVIDER_SITE_OTHER): Payer: Medicare Other

## 2018-06-17 ENCOUNTER — Encounter: Payer: Self-pay | Admitting: Orthopaedic Surgery

## 2018-06-17 ENCOUNTER — Ambulatory Visit (INDEPENDENT_AMBULATORY_CARE_PROVIDER_SITE_OTHER): Payer: Medicare Other | Admitting: Orthopaedic Surgery

## 2018-06-17 DIAGNOSIS — Z952 Presence of prosthetic heart valve: Secondary | ICD-10-CM | POA: Diagnosis not present

## 2018-06-17 DIAGNOSIS — I5032 Chronic diastolic (congestive) heart failure: Secondary | ICD-10-CM | POA: Diagnosis not present

## 2018-06-17 DIAGNOSIS — I69311 Memory deficit following cerebral infarction: Secondary | ICD-10-CM | POA: Diagnosis not present

## 2018-06-17 DIAGNOSIS — Z7901 Long term (current) use of anticoagulants: Secondary | ICD-10-CM | POA: Diagnosis not present

## 2018-06-17 DIAGNOSIS — Z794 Long term (current) use of insulin: Secondary | ICD-10-CM | POA: Diagnosis not present

## 2018-06-17 DIAGNOSIS — E119 Type 2 diabetes mellitus without complications: Secondary | ICD-10-CM | POA: Diagnosis not present

## 2018-06-17 DIAGNOSIS — Z8674 Personal history of sudden cardiac arrest: Secondary | ICD-10-CM | POA: Diagnosis not present

## 2018-06-17 DIAGNOSIS — F0391 Unspecified dementia with behavioral disturbance: Secondary | ICD-10-CM | POA: Diagnosis not present

## 2018-06-17 DIAGNOSIS — S32010D Wedge compression fracture of first lumbar vertebra, subsequent encounter for fracture with routine healing: Secondary | ICD-10-CM

## 2018-06-17 DIAGNOSIS — I110 Hypertensive heart disease with heart failure: Secondary | ICD-10-CM | POA: Diagnosis not present

## 2018-06-17 DIAGNOSIS — R2681 Unsteadiness on feet: Secondary | ICD-10-CM | POA: Diagnosis not present

## 2018-06-17 DIAGNOSIS — G4733 Obstructive sleep apnea (adult) (pediatric): Secondary | ICD-10-CM | POA: Diagnosis not present

## 2018-06-17 DIAGNOSIS — Z87891 Personal history of nicotine dependence: Secondary | ICD-10-CM | POA: Diagnosis not present

## 2018-06-17 DIAGNOSIS — J449 Chronic obstructive pulmonary disease, unspecified: Secondary | ICD-10-CM | POA: Diagnosis not present

## 2018-06-17 DIAGNOSIS — I69398 Other sequelae of cerebral infarction: Secondary | ICD-10-CM | POA: Diagnosis not present

## 2018-06-17 DIAGNOSIS — M6281 Muscle weakness (generalized): Secondary | ICD-10-CM | POA: Diagnosis not present

## 2018-06-17 NOTE — Progress Notes (Signed)
He is not wearing his CASH brace all the time.  He is reluctant to use it.  He has no new trauma.    X-rays were done of the lumbar spine laterally, reported separately.  Encounter Diagnosis  Name Primary?  . Compression fracture of L1 vertebra with routine healing, subsequent encounter Yes   Continue the CASH brace.  Return in one month.  X-rays then.  Call if any problem.  Precautions discussed.   Electronically Signed Sanjuana Kava, MD 3/4/202010:05 AM

## 2018-06-18 ENCOUNTER — Ambulatory Visit (INDEPENDENT_AMBULATORY_CARE_PROVIDER_SITE_OTHER): Payer: Medicare Other | Admitting: Internal Medicine

## 2018-06-18 ENCOUNTER — Encounter: Payer: Self-pay | Admitting: Internal Medicine

## 2018-06-18 ENCOUNTER — Other Ambulatory Visit: Payer: Self-pay

## 2018-06-18 VITALS — BP 85/52 | HR 65 | Temp 97.6°F | Ht 72.0 in | Wt 275.8 lb

## 2018-06-18 DIAGNOSIS — F17210 Nicotine dependence, cigarettes, uncomplicated: Secondary | ICD-10-CM | POA: Diagnosis not present

## 2018-06-18 DIAGNOSIS — K7460 Unspecified cirrhosis of liver: Secondary | ICD-10-CM

## 2018-06-18 DIAGNOSIS — I5030 Unspecified diastolic (congestive) heart failure: Secondary | ICD-10-CM

## 2018-06-18 DIAGNOSIS — Z7901 Long term (current) use of anticoagulants: Secondary | ICD-10-CM

## 2018-06-18 DIAGNOSIS — Z952 Presence of prosthetic heart valve: Secondary | ICD-10-CM | POA: Diagnosis not present

## 2018-06-18 DIAGNOSIS — Z794 Long term (current) use of insulin: Secondary | ICD-10-CM

## 2018-06-18 DIAGNOSIS — Z8719 Personal history of other diseases of the digestive system: Secondary | ICD-10-CM

## 2018-06-18 DIAGNOSIS — F02818 Dementia in other diseases classified elsewhere, unspecified severity, with other behavioral disturbance: Secondary | ICD-10-CM

## 2018-06-18 DIAGNOSIS — M8448XA Pathological fracture, other site, initial encounter for fracture: Secondary | ICD-10-CM

## 2018-06-18 DIAGNOSIS — F0281 Dementia in other diseases classified elsewhere with behavioral disturbance: Secondary | ICD-10-CM | POA: Diagnosis not present

## 2018-06-18 DIAGNOSIS — I959 Hypotension, unspecified: Secondary | ICD-10-CM | POA: Diagnosis not present

## 2018-06-18 DIAGNOSIS — E119 Type 2 diabetes mellitus without complications: Secondary | ICD-10-CM

## 2018-06-18 DIAGNOSIS — Z79891 Long term (current) use of opiate analgesic: Secondary | ICD-10-CM

## 2018-06-18 DIAGNOSIS — S32010A Wedge compression fracture of first lumbar vertebra, initial encounter for closed fracture: Secondary | ICD-10-CM | POA: Insufficient documentation

## 2018-06-18 DIAGNOSIS — R350 Frequency of micturition: Secondary | ICD-10-CM

## 2018-06-18 DIAGNOSIS — Z79899 Other long term (current) drug therapy: Secondary | ICD-10-CM | POA: Diagnosis not present

## 2018-06-18 DIAGNOSIS — Z9889 Other specified postprocedural states: Secondary | ICD-10-CM

## 2018-06-18 DIAGNOSIS — I951 Orthostatic hypotension: Secondary | ICD-10-CM

## 2018-06-18 DIAGNOSIS — B182 Chronic viral hepatitis C: Secondary | ICD-10-CM

## 2018-06-18 DIAGNOSIS — I776 Arteritis, unspecified: Secondary | ICD-10-CM | POA: Diagnosis not present

## 2018-06-18 DIAGNOSIS — I38 Endocarditis, valve unspecified: Secondary | ICD-10-CM

## 2018-06-18 DIAGNOSIS — S32010S Wedge compression fracture of first lumbar vertebra, sequela: Secondary | ICD-10-CM

## 2018-06-18 MED ORDER — GLUCOSE BLOOD VI STRP: ORAL_STRIP | 12 refills | Status: DC

## 2018-06-18 MED ORDER — OXYCODONE-ACETAMINOPHEN 5-325 MG PO TABS: 1.0000 | ORAL_TABLET | Freq: Three times a day (TID) | ORAL | 0 refills | Status: DC | PRN

## 2018-06-18 MED ORDER — WARFARIN SODIUM 4 MG PO TABS: 4.0000 mg | ORAL_TABLET | Freq: Every day | ORAL | 0 refills | Status: DC

## 2018-06-18 NOTE — Assessment & Plan Note (Signed)
He has been seeing Dr. Luna Glasgow and was given brace he is trying to wear as much as possible although it hurts his back. Previously prescribed hydrocodone 5-325 for his pain. Refilled prescription for the next five days but advised patient he would have to meet with PCP to determine if longer term treatment appropriate.   - refilled Norco 5-325 MG q6h

## 2018-06-18 NOTE — Assessment & Plan Note (Signed)
EGD in 01/18/2018 for hematesis and melena. Only acute finding was mallory weiss tear, but GI felt this was not the cause of his bleeding. Recommended intermittent to reassess.   - CBC to assess for anemia

## 2018-06-18 NOTE — Assessment & Plan Note (Signed)
Diagnosed in July and placed on levemir 16U qhs and novolog sliding scale. Uses one touch and needs strips today. His last Hgba1c was 8.6 when he was first diagnosed. He may be able to transition to po as he appears to have been started initially on insulin. I will try to have him f/u with his PCP next week after bringing in his glucose readings.   - glucose strips ordered - HgbA1c - will check glucose 3x day with f/u in one week to determine titration of alternative po medications.  - f/u in one week to meet PCP, will bring glucose readings

## 2018-06-18 NOTE — Assessment & Plan Note (Signed)
History of cirrhosis, unclear exact etiology but diagnosed last September with HCV. Initially saw ID 01/12/2018 but has been unable to return due to becoming ill and going to [place]. 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 He was started on lactulose for encephalopathy around 01/2018 as well. He has been having 2 BM today.   - follow-up with ID for HCV treatment  - CMP - increased lactulose to BID and educated patient and his daughter on goal of three BM per day

## 2018-06-18 NOTE — Progress Notes (Signed)
   CC: medicine check  HPI:  [name] is a 68 y.o. with PMH as below presenting after being at [facility] for the last several months for rehabilitation for medication check and refills.   Please see A&P for assessment of the patient's acute and chronic medical conditions.    Past Medical History:  Diagnosis Date  . COPD (chronic obstructive pulmonary disease) ([place])   . Diabetes mellitus without complication ([place])   . Mechanical heart valve present   . Pre-diabetes   . Seizures ([place])    Review of Systems:   Review of Systems  Constitutional: Negative for fever, malaise/fatigue and weight loss.  Respiratory: Negative for cough and shortness of breath.   Cardiovascular: Negative for chest pain, orthopnea and leg swelling.  Gastrointestinal: Negative for abdominal pain, blood in stool, constipation, diarrhea, nausea and vomiting.  Genitourinary: Positive for frequency. Negative for dysuria and hematuria.  Musculoskeletal: Positive for back pain. Negative for falls.  Endo/Heme/Allergies: Does not bruise/bleed easily.   Physical Exam:  Constitution: NAD, obese Cardio: RRR. No m/r/g  Respiratory: CTA, no w/r/r  Abdominal: soft, NTTP  MSK: moving all extremities, no pitting edema  Neuro: a&o, slow affect  Skin: clean dry intact    Vitals:   06/18/18 1038  BP: (!) 87/55  Pulse: 67  Temp: 97.6 F (36.4 C)  TempSrc: Oral  SpO2: 97%  Weight: 275 lb 12.8 oz (125.1 kg)    Assessment & Plan:   See Encounters Tab for problem based charting.  Patient discussed with Dr. Dareen Piano

## 2018-06-18 NOTE — Patient Instructions (Addendum)
Thank you for allowing [place] to provide your care today. Today we discussed heart failure, diabetes,     I have ordered the following labs for you:  Complete blood count, hemoglobin A1c, and complete metabolic panel    I will call if any are abnormal.    Please check blood sugar three times daily. Bring these readings to your follow-up appointment to determine if we can transition to by mouth medications.   Please consider increasing your Lactulose to twice per day to increase bowel movements. Goal bowel movements should be three times per day.   Please make a follow-up appointment with your Infectious Disease Physician.   Today we made the following changes to your medications:   Please STOP taking   Lasix (FUROSEMIDE) 40 MG tablet for the next two days. Please check blood pressure daily and begin taking again after two days or if weight gain >5lbs occurs.   Please follow-up in one week.    Should you have any questions or concerns please call the internal medicine clinic at [phone].

## 2018-06-18 NOTE — Assessment & Plan Note (Signed)
For bicuspid aortic valve. Will be going to coumadin clinic in [place] for INR checks. Denies chest pain, SOB, or leg swelling.   - refilled coumadin

## 2018-06-18 NOTE — Assessment & Plan Note (Signed)
BP Readings from Last 3 Encounters:  06/18/18 (!) 85/52  06/04/18 110/60  05/29/18 (!) 110/58   Blood pressure usually low but running lower than usual. He has no symptoms of dizziness. Discussed with him and his daughter and will hold lasix for the next two days and to restart if he notes SOB, weight gain, or increased LE edema. Discussed that he will need to continue weighing himself daily with his chronic heart failure and to call the clinic if he gains more than 5lbs from baseline, has increased significant LE edema or shortness of breath.    - hold lasix 40 mg bid for two days

## 2018-06-18 NOTE — Assessment & Plan Note (Signed)
History lobectomy and more recently of confusion with differential of dementia vs. Hepatic encephalopathy vs. Pseudodementia. He was placed on lexapro 10 mg qd, memantine 5 mg tablet qd with improvement in symptoms per him and his daughter. He is doing well and is a&o although he does have slower affect.   - cont. memantine and lexapro

## 2018-06-19 ENCOUNTER — Telehealth: Payer: Self-pay | Admitting: Internal Medicine

## 2018-06-19 LAB — CBC
Hematocrit: 38.1 % (ref 37.5–51.0)
Hemoglobin: 13.5 g/dL (ref 13.0–17.7)
MCH: 30.1 pg (ref 26.6–33.0)
MCHC: 35.4 g/dL (ref 31.5–35.7)
MCV: 85 fL (ref 79–97)
Platelets: 146 10*3/uL — ABNORMAL LOW (ref 150–450)
RBC: 4.49 x10E6/uL (ref 4.14–5.80)
RDW: 15.4 % (ref 11.6–15.4)
WBC: 6.6 10*3/uL (ref 3.4–10.8)

## 2018-06-19 LAB — HEMOGLOBIN A1C
Est. average glucose Bld gHb Est-mCnc: 131 mg/dL
Hgb A1c MFr Bld: 6.2 % — ABNORMAL HIGH (ref 4.8–5.6)

## 2018-06-19 LAB — CMP14 + ANION GAP
ALT: 41 IU/L (ref 0–44)
AST: 58 IU/L — ABNORMAL HIGH (ref 0–40)
Albumin/Globulin Ratio: 1 — ABNORMAL LOW (ref 1.2–2.2)
Albumin: 3.4 g/dL — ABNORMAL LOW (ref 3.8–4.8)
Alkaline Phosphatase: 212 IU/L — ABNORMAL HIGH (ref 39–117)
Anion Gap: 14 mmol/L (ref 10.0–18.0)
BUN/Creatinine Ratio: 23 (ref 10–24)
BUN: 27 mg/dL (ref 8–27)
Bilirubin Total: 0.8 mg/dL (ref 0.0–1.2)
CO2: 21 mmol/L (ref 20–29)
Calcium: 9 mg/dL (ref 8.6–10.2)
Chloride: 102 mmol/L (ref 96–106)
Creatinine, Ser: 1.19 mg/dL (ref 0.76–1.27)
GFR calc Af Amer: 72 mL/min/{1.73_m2} (ref >59)
GFR calc non Af Amer: 62 mL/min/{1.73_m2} (ref >59)
Globulin, Total: 3.4 g/dL (ref 1.5–4.5)
Glucose: 134 mg/dL — ABNORMAL HIGH (ref 65–99)
Potassium: 4.5 mmol/L (ref 3.5–5.2)
Sodium: 137 mmol/L (ref 134–144)
Total Protein: 6.8 g/dL (ref 6.0–8.5)

## 2018-06-19 NOTE — Progress Notes (Signed)
Internal Medicine Clinic Attending  Case discussed with Dr. Seawell at the time of the visit.  We reviewed the resident's history and exam and pertinent patient test results.  I agree with the assessment, diagnosis, and plan of care documented in the resident's note.    

## 2018-06-19 NOTE — Telephone Encounter (Signed)
VO for OT reschedule for next week, do you agree?

## 2018-06-19 NOTE — Telephone Encounter (Signed)
Will @ [facility] need VO; [phone]

## 2018-06-19 NOTE — Telephone Encounter (Signed)
Sounds good to me

## 2018-06-22 ENCOUNTER — Ambulatory Visit (INDEPENDENT_AMBULATORY_CARE_PROVIDER_SITE_OTHER): Payer: Medicare Other | Admitting: *Deleted

## 2018-06-22 ENCOUNTER — Telehealth: Payer: Self-pay

## 2018-06-22 DIAGNOSIS — I214 Non-ST elevation (NSTEMI) myocardial infarction: Secondary | ICD-10-CM | POA: Diagnosis not present

## 2018-06-22 DIAGNOSIS — Z5181 Encounter for therapeutic drug level monitoring: Secondary | ICD-10-CM | POA: Diagnosis not present

## 2018-06-22 DIAGNOSIS — Z952 Presence of prosthetic heart valve: Secondary | ICD-10-CM

## 2018-06-22 LAB — POCT INR: INR: 2.4 (ref 2.0–3.0)

## 2018-06-22 NOTE — Patient Instructions (Signed)
Continue coumadin 4mg  daily.  Recheck INR in 3 weeks.

## 2018-06-22 NOTE — Telephone Encounter (Signed)
Pt's daughter calling back to speak with Dr Sharon Seller for lab results. Please call back.

## 2018-06-23 ENCOUNTER — Ambulatory Visit (INDEPENDENT_AMBULATORY_CARE_PROVIDER_SITE_OTHER): Payer: Medicare Other | Admitting: Internal Medicine

## 2018-06-23 ENCOUNTER — Other Ambulatory Visit: Payer: Self-pay

## 2018-06-23 VITALS — BP 95/53 | HR 59 | Temp 97.7°F | Ht 72.0 in

## 2018-06-23 DIAGNOSIS — J449 Chronic obstructive pulmonary disease, unspecified: Secondary | ICD-10-CM

## 2018-06-23 DIAGNOSIS — G40909 Epilepsy, unspecified, not intractable, without status epilepticus: Secondary | ICD-10-CM | POA: Diagnosis not present

## 2018-06-23 DIAGNOSIS — F17210 Nicotine dependence, cigarettes, uncomplicated: Secondary | ICD-10-CM | POA: Diagnosis not present

## 2018-06-23 DIAGNOSIS — M6281 Muscle weakness (generalized): Secondary | ICD-10-CM | POA: Diagnosis not present

## 2018-06-23 DIAGNOSIS — S32010S Wedge compression fracture of first lumbar vertebra, sequela: Secondary | ICD-10-CM

## 2018-06-23 DIAGNOSIS — I5032 Chronic diastolic (congestive) heart failure: Secondary | ICD-10-CM | POA: Diagnosis not present

## 2018-06-23 DIAGNOSIS — F0391 Unspecified dementia with behavioral disturbance: Secondary | ICD-10-CM | POA: Diagnosis not present

## 2018-06-23 DIAGNOSIS — Z8719 Personal history of other diseases of the digestive system: Secondary | ICD-10-CM

## 2018-06-23 DIAGNOSIS — F02818 Dementia in other diseases classified elsewhere, unspecified severity, with other behavioral disturbance: Secondary | ICD-10-CM

## 2018-06-23 DIAGNOSIS — E119 Type 2 diabetes mellitus without complications: Secondary | ICD-10-CM

## 2018-06-23 DIAGNOSIS — B182 Chronic viral hepatitis C: Secondary | ICD-10-CM | POA: Diagnosis not present

## 2018-06-23 DIAGNOSIS — I69311 Memory deficit following cerebral infarction: Secondary | ICD-10-CM | POA: Diagnosis not present

## 2018-06-23 DIAGNOSIS — Z952 Presence of prosthetic heart valve: Secondary | ICD-10-CM | POA: Diagnosis not present

## 2018-06-23 DIAGNOSIS — Z79891 Long term (current) use of opiate analgesic: Secondary | ICD-10-CM | POA: Diagnosis not present

## 2018-06-23 DIAGNOSIS — Z79899 Other long term (current) drug therapy: Secondary | ICD-10-CM | POA: Diagnosis not present

## 2018-06-23 DIAGNOSIS — Z7901 Long term (current) use of anticoagulants: Secondary | ICD-10-CM

## 2018-06-23 DIAGNOSIS — I38 Endocarditis, valve unspecified: Secondary | ICD-10-CM

## 2018-06-23 DIAGNOSIS — F0281 Dementia in other diseases classified elsewhere with behavioral disturbance: Secondary | ICD-10-CM

## 2018-06-23 DIAGNOSIS — M4856XD Collapsed vertebra, not elsewhere classified, lumbar region, subsequent encounter for fracture with routine healing: Secondary | ICD-10-CM | POA: Diagnosis not present

## 2018-06-23 DIAGNOSIS — I69398 Other sequelae of cerebral infarction: Secondary | ICD-10-CM | POA: Diagnosis not present

## 2018-06-23 DIAGNOSIS — I110 Hypertensive heart disease with heart failure: Secondary | ICD-10-CM | POA: Diagnosis not present

## 2018-06-23 DIAGNOSIS — I5030 Unspecified diastolic (congestive) heart failure: Secondary | ICD-10-CM

## 2018-06-23 MED ORDER — PANTOPRAZOLE SODIUM 40 MG PO TBEC: 40.0000 mg | DELAYED_RELEASE_TABLET | Freq: Every day | ORAL | Status: DC

## 2018-06-23 MED ORDER — ESCITALOPRAM OXALATE 10 MG PO TABS: 10.0000 mg | ORAL_TABLET | Freq: Every day | ORAL | 3 refills | Status: DC

## 2018-06-23 MED ORDER — LEVETIRACETAM 1000 MG PO TABS: 1000.0000 mg | ORAL_TABLET | Freq: Two times a day (BID) | ORAL | 3 refills | Status: DC

## 2018-06-23 MED ORDER — OXYCODONE-ACETAMINOPHEN 5-325 MG PO TABS: 1.0000 | ORAL_TABLET | Freq: Two times a day (BID) | ORAL | 0 refills | Status: DC | PRN

## 2018-06-23 NOTE — Progress Notes (Signed)
CC: Back Pain  HPI: Adam Savage is a 68 y.o. M w/ PMH of COPD, T2DM, dementia, AVR on warfarin and L1 compression fracture. Mr.Maves was evaluated with daughter present. He states he has been getting home health physical therapy which was resumed this week. He states he has been having significant back pain, improved with back brace. He has been following with orthopedic for evaluation who recommend continuing PT and back brace. He mentions he did some reading about back brace can worsen back pain long term. He states he is eager to continue physical therapy but he has difficulty due to his significant pain. Denies any lower extremity weakness, urinary/bowel incontinence, numbness or tingling. Daughter mentions his dementia has significantly improved and although he has occasional forgetfulness, he no longer has significant anxiety or agitation.  Past Medical History:  Diagnosis Date  . COPD (chronic obstructive pulmonary disease) ([place])   . Diabetes mellitus without complication ([place])   . H/O diverticulitis of colon 11/03/2012   History of partial colectomy in 2011.  . Mechanical heart valve present   . Overdose of muscle relaxant 08/11/2017  . Pre-diabetes   . Retroperitoneal hematoma 01/29/2018  . S/P AVR (aortic valve replacement)   . Seizures ([place])   . 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  Eyes: Negative for blurred vision.  Respiratory: Negative for shortness of breath.   Cardiovascular: Negative for chest pain, palpitations and leg swelling.  Gastrointestinal: Negative for constipation, diarrhea, nausea and vomiting.  Musculoskeletal: Positive for back pain and joint pain. Negative for falls and myalgias.  Neurological: Negative for dizziness, sensory change, focal weakness and headaches.  Psychiatric/Behavioral: Negative for depression.  All other systems reviewed and  are negative.    Physical Exam: Vitals:   06/23/18 1522 06/23/18 1523  BP: (!) 95/53   Pulse: (!) 59   Temp: 97.7 F (36.5 C)   TempSrc: Oral   SpO2: 98%   Height:  6' (1.829 m)    Physical Exam  Constitutional: He is oriented to person, place, and time. He appears well-developed and well-nourished.  obese  HENT:  Mouth/Throat: Oropharynx is clear and moist.  Eyes: Conjunctivae are normal. No scleral icterus.  Neck: Normal range of motion. Neck supple.  Cardiovascular: Normal rate, regular rhythm, normal heart sounds and intact distal pulses.  No murmur heard. Respiratory: Effort normal and breath sounds normal. He has no wheezes. He has no rales.  GI: Soft. Bowel sounds are normal. There is no abdominal tenderness.  Musculoskeletal: Normal range of motion.        General: Tenderness (midspinal tenderness to palpation) present. No edema.  Neurological: He is alert and oriented to person, place, and time.  Lower extremity sensation intact bilaterally  Skin: Skin is warm and dry.    Assessment & Plan:   Chronic hepatitis C without hepatic coma (HCC) CMP Latest Ref Rng & Units 06/18/2018 06/04/2018 01/30/2018  Glucose 65 - 99 mg/dL 134(H) 178(H) 94  BUN 8 - 27 mg/dL 27 27 28(H)  Creatinine 0.76 - 1.27 mg/dL 1.19 1.10 1.20  Sodium 134 - 144 mmol/L 137 138 141  Potassium 3.5 - 5.2 mmol/L 4.5 4.5 3.6  Chloride 96 - 106 mmol/L 102 103 109  CO2 20 - 29 mmol/L 21 30(H) 25  Calcium 8.6 - 10.2 mg/dL 9.0 8.9 8.3(L)  Total Protein 6.0 - 8.5 g/dL 6.8 - -  Total  Bilirubin 0.0 - 1.2 mg/dL 0.8 - -  Alkaline Phos 39 - 117 IU/L 212(H) - -  AST 0 - 40 IU/L 58(H) - -  ALT 0 - 44 IU/L 41 - -     Has hx of HCV w/o treatment. He has followed up with ID but has not started treatment yet due to recent hospitalizations. Currently denies any significant encephalopathy per patient and family. Tolerating lactulose without too much diarrhea. ID currently awaiting abdominal ultrasound. Discussed  need to f/u with ID. Daughter expressed understanding and states she will make F/u appointment.  Seizure disorder ([place]) Pt requires refills on medications with associated diagnosis above.  Reviewed disease process and find this medication to be necessary, will not change dose or alter current therapy.  Dementia associated with other underlying disease with behavioral disturbance ([place]) Pt requires refills on medications with associated diagnosis above.  Reviewed disease process and find this medication to be necessary, will not change dose or alter current therapy.  H/O: GI bleed Lab Results  Component Value Date   WBC 6.6 06/18/2018   HGB 13.5 06/18/2018   HCT 38.1 06/18/2018   MCV 85 06/18/2018   PLT 146 (L) 06/18/2018   Hx of hematemesis and melena 2/2 mallory weiss tear in 2019. Currently not endorsing any melena, fatigue, hematemesis. Last cbc checked on 06/18/18 shows resolution of anemia. Currently taking protonix 40mg . Requesting refill this visit.  - C/w pantoprazole 40mg  daily   Compression fracture of L1 lumbar vertebra (HCC) Hx of old compression fracture in L3 and more recent healing compression fracture in L1 on most recent imaging. Previously prescribed hydrocodone 5-325mg  for back pain while in rehab. Currently following with Dr.Keeling. Recently started home physical therapy with difficulty due to inadequately controlled pain. Described risks and benefits of long term opioid use. Will prescribe 30 day supply to assist in tolerating physical therapy with strong warnings against over-use.  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 Currently on furosemide, carvedilol and imdur. Tolerating furosemide w/o significant side effects. Had low blood pressure at 85/52 last visit and current bp also on the low  end at 95/53. Concerning for overuse of antihypertensive medications. Daughter states he was prescribed Imdur after consistent chest pain after CPR during cardiac arrest during prior hospitalization. Recent cardiology note do not mention necessity of long acting nitro.  - Recommend discontinuing isosorbide moninitrate 30mg  daily - Recommend monitoring bp at home    Patient discussed with Dr. Angelia Mould   -Gilberto Better, PGY1

## 2018-06-23 NOTE — Patient Instructions (Addendum)
Thank you for allowing [place] to provide your care today. Today we discussed your diabetes, dementia and     I have ordered no labs for you. I will call if any are abnormal.    Today we made the following changes to your medications:  - Please stop taking the Imdur   - Take all of your other medications as prescribed  Please follow-up in 3 months.    Should you have any questions or concerns please call the internal medicine clinic at [phone].

## 2018-06-24 DIAGNOSIS — I69311 Memory deficit following cerebral infarction: Secondary | ICD-10-CM | POA: Diagnosis not present

## 2018-06-24 DIAGNOSIS — I69398 Other sequelae of cerebral infarction: Secondary | ICD-10-CM | POA: Diagnosis not present

## 2018-06-24 DIAGNOSIS — M6281 Muscle weakness (generalized): Secondary | ICD-10-CM | POA: Diagnosis not present

## 2018-06-24 DIAGNOSIS — F0391 Unspecified dementia with behavioral disturbance: Secondary | ICD-10-CM | POA: Diagnosis not present

## 2018-06-24 DIAGNOSIS — I110 Hypertensive heart disease with heart failure: Secondary | ICD-10-CM | POA: Diagnosis not present

## 2018-06-24 DIAGNOSIS — I5032 Chronic diastolic (congestive) heart failure: Secondary | ICD-10-CM | POA: Diagnosis not present

## 2018-06-25 DIAGNOSIS — M6281 Muscle weakness (generalized): Secondary | ICD-10-CM | POA: Diagnosis not present

## 2018-06-25 DIAGNOSIS — F0391 Unspecified dementia with behavioral disturbance: Secondary | ICD-10-CM | POA: Diagnosis not present

## 2018-06-25 DIAGNOSIS — I69398 Other sequelae of cerebral infarction: Secondary | ICD-10-CM | POA: Diagnosis not present

## 2018-06-25 DIAGNOSIS — I69311 Memory deficit following cerebral infarction: Secondary | ICD-10-CM | POA: Diagnosis not present

## 2018-06-25 DIAGNOSIS — I5032 Chronic diastolic (congestive) heart failure: Secondary | ICD-10-CM | POA: Diagnosis not present

## 2018-06-25 DIAGNOSIS — I110 Hypertensive heart disease with heart failure: Secondary | ICD-10-CM | POA: Diagnosis not present

## 2018-06-26 ENCOUNTER — Other Ambulatory Visit: Payer: Self-pay | Admitting: Internal Medicine

## 2018-06-26 NOTE — Telephone Encounter (Signed)
QUEtiapine (SEROQUEL) 50 MG tablet   pantoprazole (PROTONIX) 40 MG tablet,  atorvastatin (LIPITOR) 10 MG tablet, refill request @  [id number] [place], [place] [facility] [phone] (Phone) [phone] (Fax)

## 2018-06-28 ENCOUNTER — Encounter: Payer: Self-pay | Admitting: Internal Medicine

## 2018-06-28 NOTE — Assessment & Plan Note (Signed)
Pt requires refills on medications with associated diagnosis above.  Reviewed disease process and find this medication to be necessary, will not change dose or alter current therapy. 

## 2018-06-28 NOTE — Assessment & Plan Note (Signed)
Hx of old compression fracture in L3 and more recent healing compression fracture in L1 on most recent imaging. Previously prescribed hydrocodone 5-325mg  for back pain while in rehab. Currently following with Dr.Keeling. Recently started home physical therapy with difficulty due to inadequately controlled pain. Described risks and benefits of long term opioid use. Will prescribe 30 day supply to assist in tolerating physical therapy with strong warnings against over-use.  - UDS - Norco 5-325mg  q6hr PRN #60 for 30 days

## 2018-06-28 NOTE — Assessment & Plan Note (Signed)
CMP Latest Ref Rng & Units 06/18/2018 06/04/2018 01/30/2018  Glucose 65 - 99 mg/dL 134(H) 178(H) 94  BUN 8 - 27 mg/dL 27 27 28(H)  Creatinine 0.76 - 1.27 mg/dL 1.19 1.10 1.20  Sodium 134 - 144 mmol/L 137 138 141  Potassium 3.5 - 5.2 mmol/L 4.5 4.5 3.6  Chloride 96 - 106 mmol/L 102 103 109  CO2 20 - 29 mmol/L 21 30(H) 25  Calcium 8.6 - 10.2 mg/dL 9.0 8.9 8.3(L)  Total Protein 6.0 - 8.5 g/dL 6.8 - -  Total Bilirubin 0.0 - 1.2 mg/dL 0.8 - -  Alkaline Phos 39 - 117 IU/L 212(H) - -  AST 0 - 40 IU/L 58(H) - -  ALT 0 - 44 IU/L 41 - -     Has hx of HCV w/o treatment. He has followed up with ID but has not started treatment yet due to recent hospitalizations. Currently denies any significant encephalopathy per patient and family. Tolerating lactulose without too much diarrhea. ID currently awaiting abdominal ultrasound. Discussed need to f/u with ID. Daughter expressed understanding and states she will make F/u appointment.

## 2018-06-28 NOTE — Assessment & Plan Note (Signed)
BP Readings from Last 3 Encounters:  06/23/18 (!) 95/53  06/18/18 (!) 85/52  06/04/18 110/60    Presents with hx of HFpEF. Currently on furosemide, carvedilol and imdur. Tolerating furosemide w/o significant side effects. Had low blood pressure at 85/52 last visit and current bp also on the low end at 95/53. Concerning for overuse of antihypertensive medications. Daughter states he was prescribed Imdur after consistent chest pain after CPR during cardiac arrest during prior hospitalization. Recent cardiology note do not mention necessity of long acting nitro.  - Recommend discontinuing isosorbide moninitrate 30mg  daily - Recommend monitoring bp at home

## 2018-06-28 NOTE — Assessment & Plan Note (Signed)
Lab Results  Component Value Date   WBC 6.6 06/18/2018   HGB 13.5 06/18/2018   HCT 38.1 06/18/2018   MCV 85 06/18/2018   PLT 146 (L) 06/18/2018   Hx of hematemesis and melena 2/2 mallory weiss tear in 2019. Currently not endorsing any melena, fatigue, hematemesis. Last cbc checked on 06/18/18 shows resolution of anemia. Currently taking protonix 40mg . Requesting refill this visit.  - C/w pantoprazole 40mg  daily

## 2018-06-29 ENCOUNTER — Other Ambulatory Visit: Payer: Self-pay | Admitting: *Deleted

## 2018-06-29 ENCOUNTER — Other Ambulatory Visit: Payer: Self-pay | Admitting: Internal Medicine

## 2018-06-29 DIAGNOSIS — Z8719 Personal history of other diseases of the digestive system: Secondary | ICD-10-CM

## 2018-06-29 DIAGNOSIS — F0281 Dementia in other diseases classified elsewhere with behavioral disturbance: Secondary | ICD-10-CM

## 2018-06-29 DIAGNOSIS — F02818 Dementia in other diseases classified elsewhere, unspecified severity, with other behavioral disturbance: Secondary | ICD-10-CM

## 2018-06-29 LAB — TOXASSURE SELECT,+ANTIDEPR,UR

## 2018-06-29 MED ORDER — PANTOPRAZOLE SODIUM 40 MG PO TBEC: 40.0000 mg | DELAYED_RELEASE_TABLET | Freq: Every day | ORAL | Status: DC

## 2018-06-29 MED ORDER — QUETIAPINE FUMARATE 50 MG PO TABS: 50.0000 mg | ORAL_TABLET | Freq: Two times a day (BID) | ORAL | 3 refills | Status: DC

## 2018-06-29 NOTE — Telephone Encounter (Signed)
Sent in new encounter 

## 2018-06-29 NOTE — Progress Notes (Signed)
Internal Medicine Clinic Attending ° °Case discussed with Dr. Lee at the time of the visit.  We reviewed the resident’s history and exam and pertinent patient test results.  I agree with the assessment, diagnosis, and plan of care documented in the resident’s note.  °

## 2018-06-29 NOTE — Telephone Encounter (Signed)
Needs refill on QUEtiapine (SEROQUEL) 50 MG tablet pantoprazole (PROTONIX) 40 MG tablet ;pt contact [phone] [id number] [place] [place] [address]

## 2018-06-29 NOTE — Telephone Encounter (Signed)
Patient seen in clinic on 06/23/2018. Hubbard Hartshorn, RN, BSN

## 2018-07-09 ENCOUNTER — Ambulatory Visit: Payer: Self-pay | Admitting: Interventional Cardiology

## 2018-07-09 ENCOUNTER — Telehealth: Payer: Self-pay

## 2018-07-09 ENCOUNTER — Other Ambulatory Visit: Payer: Self-pay | Admitting: *Deleted

## 2018-07-09 DIAGNOSIS — E7200 Disorders of amino-acid transport, unspecified: Secondary | ICD-10-CM | POA: Diagnosis not present

## 2018-07-09 DIAGNOSIS — E785 Hyperlipidemia, unspecified: Secondary | ICD-10-CM | POA: Diagnosis not present

## 2018-07-09 DIAGNOSIS — Z8719 Personal history of other diseases of the digestive system: Secondary | ICD-10-CM

## 2018-07-09 DIAGNOSIS — E1159 Type 2 diabetes mellitus with other circulatory complications: Secondary | ICD-10-CM | POA: Diagnosis not present

## 2018-07-09 DIAGNOSIS — F0150 Vascular dementia without behavioral disturbance: Secondary | ICD-10-CM | POA: Diagnosis not present

## 2018-07-09 DIAGNOSIS — E039 Hypothyroidism, unspecified: Secondary | ICD-10-CM | POA: Diagnosis not present

## 2018-07-09 DIAGNOSIS — G40909 Epilepsy, unspecified, not intractable, without status epilepticus: Secondary | ICD-10-CM

## 2018-07-09 DIAGNOSIS — E559 Vitamin D deficiency, unspecified: Secondary | ICD-10-CM | POA: Diagnosis not present

## 2018-07-09 DIAGNOSIS — I10 Essential (primary) hypertension: Secondary | ICD-10-CM | POA: Diagnosis not present

## 2018-07-09 DIAGNOSIS — G4089 Other seizures: Secondary | ICD-10-CM | POA: Diagnosis not present

## 2018-07-09 MED ORDER — LACTULOSE 10 GM/15ML PO SOLN: 20.0000 g | Freq: Every day | ORAL | 0 refills | Status: DC

## 2018-07-09 MED ORDER — PANTOPRAZOLE SODIUM 40 MG PO TBEC: 40.0000 mg | DELAYED_RELEASE_TABLET | Freq: Every day | ORAL | Status: DC

## 2018-07-09 NOTE — Telephone Encounter (Signed)
Pt's daughter requesting to speak with a nurse about her father feeling dizzy. Please call back.

## 2018-07-09 NOTE — Telephone Encounter (Signed)
Pt's daughter calls and states pt is having some "head spinning", dizziness with turning over in bed and sitting up and standing, doesn't always happen but is concerning at this point. She states pt is taking meds as directed except he does take an extra pain pill at night somewhat often, she states he refuses to take tylenol because he states it does not help. She states he is taking po fluids well. She would like to avoid an appt if at all possible. She states they have ask PT not to come due to [place] but would like for a HHN to visit and they prefer [facility]

## 2018-07-09 NOTE — Telephone Encounter (Signed)
Pantoprazole [facility].. please resend [facility] [place].  Pt out of lactulose.Adam Savage, Adam Homan Cassady3/26/202011:25 AM

## 2018-07-11 ENCOUNTER — Other Ambulatory Visit: Payer: Self-pay | Admitting: Internal Medicine

## 2018-07-13 ENCOUNTER — Other Ambulatory Visit: Payer: Self-pay | Admitting: Internal Medicine

## 2018-07-13 ENCOUNTER — Other Ambulatory Visit: Payer: Self-pay

## 2018-07-13 DIAGNOSIS — Z952 Presence of prosthetic heart valve: Secondary | ICD-10-CM

## 2018-07-13 NOTE — Telephone Encounter (Signed)
Refill Request  lactulose (CHRONULAC) 10 GM/15ML solution  [id number] [place], [place] - [address]

## 2018-07-13 NOTE — Telephone Encounter (Signed)
PCP approved lactulose refill on 07/09/18, but rx was set to "no print".  Call made to pharmacy to phone in rx, pharmacist wanted to confirm dose.  Pt's dtr brought in old bottle previously filled by [facility]-directions were for 84mls (10g) daily vs [facility] medication list for for 62mls (20g)daily.  Since pt was completely out, the pharmacist provided her with a 5 day "emergency supply".   Will send to pcp to clarify dose and send in new rx to pharmacy.Regenia Skeeter, Mayco Walrond Cassady3/30/202011:23 AM

## 2018-07-13 NOTE — Telephone Encounter (Signed)
Rec'd call from the pt's daughter Caryl Pina to please correct his pharmacy on all of his prescriptions. He no longer uses [facility] but uses the following pharmacy:   carvedilol (COREG) 6.25 MG tablet [id number] [place],  - [place] AT [facility]

## 2018-07-14 ENCOUNTER — Encounter: Payer: Self-pay | Admitting: Internal Medicine

## 2018-07-14 ENCOUNTER — Ambulatory Visit (INDEPENDENT_AMBULATORY_CARE_PROVIDER_SITE_OTHER): Payer: Medicare Other | Admitting: Internal Medicine

## 2018-07-14 ENCOUNTER — Other Ambulatory Visit: Payer: Self-pay | Admitting: Internal Medicine

## 2018-07-14 DIAGNOSIS — I38 Endocarditis, valve unspecified: Secondary | ICD-10-CM

## 2018-07-14 DIAGNOSIS — I5030 Unspecified diastolic (congestive) heart failure: Secondary | ICD-10-CM | POA: Diagnosis not present

## 2018-07-14 DIAGNOSIS — R42 Dizziness and giddiness: Secondary | ICD-10-CM | POA: Insufficient documentation

## 2018-07-14 DIAGNOSIS — Z7901 Long term (current) use of anticoagulants: Secondary | ICD-10-CM | POA: Diagnosis not present

## 2018-07-14 DIAGNOSIS — S32010S Wedge compression fracture of first lumbar vertebra, sequela: Secondary | ICD-10-CM

## 2018-07-14 DIAGNOSIS — Z8719 Personal history of other diseases of the digestive system: Secondary | ICD-10-CM | POA: Diagnosis not present

## 2018-07-14 MED ORDER — PANTOPRAZOLE SODIUM 40 MG PO TBEC: 40.0000 mg | DELAYED_RELEASE_TABLET | Freq: Every day | ORAL | 3 refills | Status: DC

## 2018-07-14 MED ORDER — LACTULOSE 10 GM/15ML PO SOLN: 10.0000 g | Freq: Every day | ORAL | 3 refills | Status: DC

## 2018-07-14 MED ORDER — WARFARIN SODIUM 4 MG PO TABS: 4.0000 mg | ORAL_TABLET | Freq: Every day | ORAL | 3 refills | Status: DC

## 2018-07-14 MED ORDER — CARVEDILOL 6.25 MG PO TABS: 6.2500 mg | ORAL_TABLET | Freq: Two times a day (BID) | ORAL | 3 refills | Status: DC

## 2018-07-14 MED ORDER — ONDANSETRON HCL 4 MG PO TABS: 4.0000 mg | ORAL_TABLET | Freq: Three times a day (TID) | ORAL | 0 refills | Status: DC | PRN

## 2018-07-14 NOTE — Telephone Encounter (Signed)
Called and spoke to pt and daughter. Refer to telehealth visit note for details

## 2018-07-14 NOTE — Assessment & Plan Note (Signed)
Was prescribed opioid medication during last visit for compression fracture pain. Currently endorsing constipation. Had relief with lactulose in the past. Requesting refill. [facility].

## 2018-07-14 NOTE — Assessment & Plan Note (Signed)
Mentions that PPI was sent to wrong pharmacy. Will [facility].

## 2018-07-14 NOTE — Progress Notes (Signed)
This is a telephone encounter between Adam Savage and Adam Savage on 07/14/2018 for dizziness. The visit was conducted with the patient located at home and Adam Savage at [facility]. The patient's identity was confirmed using their DOB and current address. The patient has consented to being evaluated through a telephone encounter and understands the associated risks (an examination cannot be done and the patient may need to come in for an appointment) / benefits (allows the patient to remain at home, decreasing exposure to coronavirus). I personally spent 22 minutes on medical discussion.   CC: Dizziness  HPI: [name] is a 68 y.o. M w/ PMH of dementia, T2Dm, COPD, HepC, AVR on warfarin and lumbar fracture presenting on telephone visit for dizziness. History was taken in conversation with both daughter and patient on speaker phone. He states since last week he experienced 1 episode of dizziness with sensation close to blacking out as well as 'room spinning' while he was in bed and turning his head to the right. He states since then he has had intermittent episodes similar to this on and off exacerbated by lying down. He denies getting up quickly and states each episode lasts less than 5 minutes. His daughter mentions that she measured his blood pressure when these symptoms occur with systolic blood pressure reading around 110s supine and 120s while standing. He denies any chest pain, headache, blurry vision, focal weakness, numbness, tingling, or hearing changes. He states these symptoms are new and denies any prior history.  Past Medical History:  Diagnosis Date  . COPD (chronic obstructive pulmonary disease) ([place])   . Diabetes mellitus without complication ([place])   . H/O diverticulitis of colon 11/03/2012   History of partial colectomy in 2011.  . Mechanical heart valve present   . Overdose of muscle relaxant 08/11/2017  . Pre-diabetes   . Retroperitoneal hematoma 01/29/2018  . S/P AVR (aortic  valve replacement)   . Seizures ([place])   . 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  HENT: Negative for ear pain and hearing loss.   Eyes: Negative for blurred vision.  Respiratory: Negative for cough, shortness of breath and wheezing.   Cardiovascular: Negative for chest pain, palpitations and leg swelling.  Gastrointestinal: Negative for constipation, diarrhea, nausea and vomiting.  Genitourinary: Negative for dysuria, frequency and urgency.  Neurological: Positive for dizziness. Negative for tingling, sensory change, focal weakness, weakness and headaches.    Assessment & Plan:   Vertigo Presents with intermittent episodes of dizziness. Described as 'room spinning.' Occurs primarily while lying flat with head turning to side. Denies syncope or recent head trauma. Not associated with gait disturbance, hearing loss, or focal weakness. Each episodes last less than 5 minutes. Blood pressure stable around these episodes. Most likely diagnosis is BPPV considering duration of symptom and lack of other neurological deficit or chronicity. Lower on differential includes vestibular neuritis or cerebellar stroke. Unable to perform Dix-Hallpike on telephone visit. Recommend Epley maneuver at home. Description of maneuver given over phone. Also recommended to watch online video for visual tutorial. Patient and his daughter expressed understanding. Also requests zofran in case of nausea. Will give for anti-emetic option.  - Recommend Epley maneuver at home - Take zofran 4mg  as needed for nausea, dizziness  H/O: GI bleed Mentions that PPI was sent to wrong pharmacy. [facility].  Compression fracture of L1 lumbar vertebra (HCC)  Was prescribed opioid medication during last visit for compression fracture pain. Currently endorsing constipation. Had relief with lactulose in  the past. Requesting refill. [facility].  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  Reviewed disease process and find this medication to be necessary, will not change dose or alter current therapy.  Chronic anticoagulation Pt requires refills on medications with associated diagnosis above.  Reviewed disease process and find this medication to be necessary, will not change dose or alter current therapy.    Patient discussed with Dr. Evette Doffing   -Gilberto Better, PGY1

## 2018-07-14 NOTE — Assessment & Plan Note (Signed)
Pt requires refills on medications with associated diagnosis above.  Reviewed disease process and find this medication to be necessary, will not change dose or alter current therapy. 

## 2018-07-14 NOTE — Assessment & Plan Note (Addendum)
Presents with intermittent episodes of dizziness. Described as 'room spinning.' Occurs primarily while lying flat with head turning to side. Denies syncope or recent head trauma. Not associated with gait disturbance, hearing loss, or focal weakness. Each episodes last less than 5 minutes. Blood pressure stable around these episodes. Most likely diagnosis is BPPV considering duration of symptom and lack of other neurological deficit or chronicity. Lower on differential includes vestibular neuritis or cerebellar stroke. Unable to perform Dix-Hallpike on telephone visit. Recommend Epley maneuver at home. Description of maneuver given over phone. Also recommended to watch online video for visual tutorial. Patient and his daughter expressed understanding. Also requests zofran in case of nausea. Will give for anti-emetic option.  - Recommend Epley maneuver at home - Take zofran 4mg  as needed for nausea, dizziness

## 2018-07-15 ENCOUNTER — Other Ambulatory Visit: Payer: Self-pay

## 2018-07-15 ENCOUNTER — Ambulatory Visit (INDEPENDENT_AMBULATORY_CARE_PROVIDER_SITE_OTHER): Payer: Medicare Other | Admitting: *Deleted

## 2018-07-15 DIAGNOSIS — Z5181 Encounter for therapeutic drug level monitoring: Secondary | ICD-10-CM | POA: Diagnosis not present

## 2018-07-15 DIAGNOSIS — Z952 Presence of prosthetic heart valve: Secondary | ICD-10-CM | POA: Diagnosis not present

## 2018-07-15 LAB — POCT INR: INR: 2.4 (ref 2.0–3.0)

## 2018-07-15 NOTE — Patient Instructions (Signed)
Continue coumadin 4mg  daily.  Recheck INR in 4 weeks.

## 2018-07-15 NOTE — Progress Notes (Signed)
Internal Medicine Clinic Attending ° °Case discussed with Dr. Lee at the time of the visit.  We reviewed the resident’s history and exam and pertinent patient test results.  I agree with the assessment, diagnosis, and plan of care documented in the resident’s note.  °

## 2018-07-16 ENCOUNTER — Ambulatory Visit: Payer: Medicare Other | Admitting: Orthopaedic Surgery

## 2018-07-17 DIAGNOSIS — Z794 Long term (current) use of insulin: Secondary | ICD-10-CM | POA: Diagnosis not present

## 2018-07-17 DIAGNOSIS — Z87891 Personal history of nicotine dependence: Secondary | ICD-10-CM | POA: Diagnosis not present

## 2018-07-17 DIAGNOSIS — F0391 Unspecified dementia with behavioral disturbance: Secondary | ICD-10-CM | POA: Diagnosis not present

## 2018-07-17 DIAGNOSIS — J449 Chronic obstructive pulmonary disease, unspecified: Secondary | ICD-10-CM | POA: Diagnosis not present

## 2018-07-17 DIAGNOSIS — E119 Type 2 diabetes mellitus without complications: Secondary | ICD-10-CM | POA: Diagnosis not present

## 2018-07-17 DIAGNOSIS — Z952 Presence of prosthetic heart valve: Secondary | ICD-10-CM | POA: Diagnosis not present

## 2018-07-17 DIAGNOSIS — Z7901 Long term (current) use of anticoagulants: Secondary | ICD-10-CM | POA: Diagnosis not present

## 2018-07-17 DIAGNOSIS — M6281 Muscle weakness (generalized): Secondary | ICD-10-CM | POA: Diagnosis not present

## 2018-07-17 DIAGNOSIS — Z8674 Personal history of sudden cardiac arrest: Secondary | ICD-10-CM | POA: Diagnosis not present

## 2018-07-17 DIAGNOSIS — R2681 Unsteadiness on feet: Secondary | ICD-10-CM | POA: Diagnosis not present

## 2018-07-17 DIAGNOSIS — G4733 Obstructive sleep apnea (adult) (pediatric): Secondary | ICD-10-CM | POA: Diagnosis not present

## 2018-07-17 DIAGNOSIS — I110 Hypertensive heart disease with heart failure: Secondary | ICD-10-CM | POA: Diagnosis not present

## 2018-07-17 DIAGNOSIS — I5032 Chronic diastolic (congestive) heart failure: Secondary | ICD-10-CM | POA: Diagnosis not present

## 2018-07-17 DIAGNOSIS — I69311 Memory deficit following cerebral infarction: Secondary | ICD-10-CM | POA: Diagnosis not present

## 2018-07-17 DIAGNOSIS — I69398 Other sequelae of cerebral infarction: Secondary | ICD-10-CM | POA: Diagnosis not present

## 2018-07-23 ENCOUNTER — Other Ambulatory Visit: Payer: Self-pay | Admitting: Internal Medicine

## 2018-07-23 DIAGNOSIS — I5030 Unspecified diastolic (congestive) heart failure: Secondary | ICD-10-CM

## 2018-07-23 DIAGNOSIS — Z794 Long term (current) use of insulin: Secondary | ICD-10-CM

## 2018-07-23 DIAGNOSIS — E119 Type 2 diabetes mellitus without complications: Secondary | ICD-10-CM

## 2018-07-23 DIAGNOSIS — S32010S Wedge compression fracture of first lumbar vertebra, sequela: Secondary | ICD-10-CM

## 2018-07-23 DIAGNOSIS — I38 Endocarditis, valve unspecified: Secondary | ICD-10-CM

## 2018-07-23 MED ORDER — OXYCODONE-ACETAMINOPHEN 5-325 MG PO TABS: 1.0000 | ORAL_TABLET | Freq: Two times a day (BID) | ORAL | 0 refills | Status: AC | PRN

## 2018-07-23 MED ORDER — INSULIN ASPART 100 UNIT/ML FLEXPEN: 10.0000 [IU] | PEN_INJECTOR | Freq: Three times a day (TID) | SUBCUTANEOUS | 3 refills | Status: DC

## 2018-07-23 NOTE — Telephone Encounter (Signed)
Discussed with Mr.Edenfield's daughter, [name], about Adam Savage's Novolog pen malfunctioning. She states she was panicking because he did not receive his novolog this morning and his blood sugar was in low 200s which she is not used to seeing. Mentions that he did receive his levemir last night full dose. Asking if she should give more of the levemir right now. Discussed use of long-acting versus short-acting insulin and utility of novolog in providing insulin coverage during meals and dangers of hypoglycemia with excess insulin administration. Re-assured her that missing 1 dose of novolog would not cause him to decompensate acutely. Currently not endorsing any nausea, vomiting, malaise. Discussed sending new script for novolog to his pharmacy. Ms.Ashley expressed understanding.

## 2018-07-23 NOTE — Telephone Encounter (Signed)
I'll give her a call thank you 

## 2018-07-30 DIAGNOSIS — I5032 Chronic diastolic (congestive) heart failure: Secondary | ICD-10-CM | POA: Diagnosis not present

## 2018-07-30 DIAGNOSIS — M6281 Muscle weakness (generalized): Secondary | ICD-10-CM | POA: Diagnosis not present

## 2018-07-30 DIAGNOSIS — F0391 Unspecified dementia with behavioral disturbance: Secondary | ICD-10-CM | POA: Diagnosis not present

## 2018-07-30 DIAGNOSIS — I110 Hypertensive heart disease with heart failure: Secondary | ICD-10-CM | POA: Diagnosis not present

## 2018-07-30 DIAGNOSIS — I69398 Other sequelae of cerebral infarction: Secondary | ICD-10-CM | POA: Diagnosis not present

## 2018-07-30 DIAGNOSIS — I69311 Memory deficit following cerebral infarction: Secondary | ICD-10-CM | POA: Diagnosis not present

## 2018-07-30 NOTE — Telephone Encounter (Signed)
[facility]urosemide. No pront option selected on Rx written 07/23/2018. This was [facility] to [facility]. Hubbard Hartshorn, RN, BSN

## 2018-08-05 ENCOUNTER — Other Ambulatory Visit: Payer: Self-pay | Admitting: Internal Medicine

## 2018-08-05 DIAGNOSIS — F02818 Dementia in other diseases classified elsewhere, unspecified severity, with other behavioral disturbance: Secondary | ICD-10-CM

## 2018-08-05 DIAGNOSIS — F0281 Dementia in other diseases classified elsewhere with behavioral disturbance: Secondary | ICD-10-CM

## 2018-08-07 ENCOUNTER — Other Ambulatory Visit: Payer: Self-pay | Admitting: Internal Medicine

## 2018-08-07 DIAGNOSIS — S32010S Wedge compression fracture of first lumbar vertebra, sequela: Secondary | ICD-10-CM

## 2018-08-11 ENCOUNTER — Ambulatory Visit (INDEPENDENT_AMBULATORY_CARE_PROVIDER_SITE_OTHER): Payer: Medicare Other | Admitting: *Deleted

## 2018-08-11 DIAGNOSIS — Z5181 Encounter for therapeutic drug level monitoring: Secondary | ICD-10-CM

## 2018-08-11 DIAGNOSIS — Z952 Presence of prosthetic heart valve: Secondary | ICD-10-CM | POA: Diagnosis not present

## 2018-08-11 LAB — POCT INR: INR: 2.4 (ref 2.0–3.0)

## 2018-08-11 NOTE — Patient Instructions (Signed)
Continue coumadin 4mg  daily.  Recheck INR in 4 weeks.

## 2018-08-14 DIAGNOSIS — F0391 Unspecified dementia with behavioral disturbance: Secondary | ICD-10-CM | POA: Diagnosis not present

## 2018-08-14 DIAGNOSIS — I110 Hypertensive heart disease with heart failure: Secondary | ICD-10-CM | POA: Diagnosis not present

## 2018-08-14 DIAGNOSIS — I69311 Memory deficit following cerebral infarction: Secondary | ICD-10-CM | POA: Diagnosis not present

## 2018-08-14 DIAGNOSIS — I5032 Chronic diastolic (congestive) heart failure: Secondary | ICD-10-CM | POA: Diagnosis not present

## 2018-08-14 DIAGNOSIS — I69398 Other sequelae of cerebral infarction: Secondary | ICD-10-CM | POA: Diagnosis not present

## 2018-08-14 DIAGNOSIS — M6281 Muscle weakness (generalized): Secondary | ICD-10-CM | POA: Diagnosis not present

## 2018-08-16 DIAGNOSIS — R2681 Unsteadiness on feet: Secondary | ICD-10-CM | POA: Diagnosis not present

## 2018-08-16 DIAGNOSIS — Z87891 Personal history of nicotine dependence: Secondary | ICD-10-CM | POA: Diagnosis not present

## 2018-08-16 DIAGNOSIS — F0391 Unspecified dementia with behavioral disturbance: Secondary | ICD-10-CM | POA: Diagnosis not present

## 2018-08-16 DIAGNOSIS — E119 Type 2 diabetes mellitus without complications: Secondary | ICD-10-CM | POA: Diagnosis not present

## 2018-08-16 DIAGNOSIS — I5032 Chronic diastolic (congestive) heart failure: Secondary | ICD-10-CM | POA: Diagnosis not present

## 2018-08-16 DIAGNOSIS — I69311 Memory deficit following cerebral infarction: Secondary | ICD-10-CM | POA: Diagnosis not present

## 2018-08-16 DIAGNOSIS — J449 Chronic obstructive pulmonary disease, unspecified: Secondary | ICD-10-CM | POA: Diagnosis not present

## 2018-08-16 DIAGNOSIS — I110 Hypertensive heart disease with heart failure: Secondary | ICD-10-CM | POA: Diagnosis not present

## 2018-08-16 DIAGNOSIS — Z7901 Long term (current) use of anticoagulants: Secondary | ICD-10-CM | POA: Diagnosis not present

## 2018-08-16 DIAGNOSIS — Z794 Long term (current) use of insulin: Secondary | ICD-10-CM | POA: Diagnosis not present

## 2018-08-16 DIAGNOSIS — M6281 Muscle weakness (generalized): Secondary | ICD-10-CM | POA: Diagnosis not present

## 2018-08-16 DIAGNOSIS — I69398 Other sequelae of cerebral infarction: Secondary | ICD-10-CM | POA: Diagnosis not present

## 2018-08-19 DIAGNOSIS — M6281 Muscle weakness (generalized): Secondary | ICD-10-CM | POA: Diagnosis not present

## 2018-08-19 DIAGNOSIS — F0391 Unspecified dementia with behavioral disturbance: Secondary | ICD-10-CM | POA: Diagnosis not present

## 2018-08-19 DIAGNOSIS — I110 Hypertensive heart disease with heart failure: Secondary | ICD-10-CM | POA: Diagnosis not present

## 2018-08-19 DIAGNOSIS — R2681 Unsteadiness on feet: Secondary | ICD-10-CM | POA: Diagnosis not present

## 2018-08-19 DIAGNOSIS — I69311 Memory deficit following cerebral infarction: Secondary | ICD-10-CM | POA: Diagnosis not present

## 2018-08-19 DIAGNOSIS — I69398 Other sequelae of cerebral infarction: Secondary | ICD-10-CM | POA: Diagnosis not present

## 2018-08-21 DIAGNOSIS — R2681 Unsteadiness on feet: Secondary | ICD-10-CM | POA: Diagnosis not present

## 2018-08-21 DIAGNOSIS — I69311 Memory deficit following cerebral infarction: Secondary | ICD-10-CM | POA: Diagnosis not present

## 2018-08-21 DIAGNOSIS — I110 Hypertensive heart disease with heart failure: Secondary | ICD-10-CM | POA: Diagnosis not present

## 2018-08-21 DIAGNOSIS — I69398 Other sequelae of cerebral infarction: Secondary | ICD-10-CM | POA: Diagnosis not present

## 2018-08-21 DIAGNOSIS — F0391 Unspecified dementia with behavioral disturbance: Secondary | ICD-10-CM | POA: Diagnosis not present

## 2018-08-21 DIAGNOSIS — M6281 Muscle weakness (generalized): Secondary | ICD-10-CM | POA: Diagnosis not present

## 2018-08-24 ENCOUNTER — Ambulatory Visit (INDEPENDENT_AMBULATORY_CARE_PROVIDER_SITE_OTHER): Payer: Medicare Other | Admitting: Internal Medicine

## 2018-08-24 ENCOUNTER — Other Ambulatory Visit: Payer: Self-pay

## 2018-08-24 DIAGNOSIS — I69398 Other sequelae of cerebral infarction: Secondary | ICD-10-CM | POA: Diagnosis not present

## 2018-08-24 DIAGNOSIS — I110 Hypertensive heart disease with heart failure: Secondary | ICD-10-CM | POA: Diagnosis not present

## 2018-08-24 DIAGNOSIS — Z7901 Long term (current) use of anticoagulants: Secondary | ICD-10-CM

## 2018-08-24 DIAGNOSIS — I69311 Memory deficit following cerebral infarction: Secondary | ICD-10-CM | POA: Diagnosis not present

## 2018-08-24 DIAGNOSIS — I5030 Unspecified diastolic (congestive) heart failure: Secondary | ICD-10-CM | POA: Diagnosis not present

## 2018-08-24 DIAGNOSIS — M4856XD Collapsed vertebra, not elsewhere classified, lumbar region, subsequent encounter for fracture with routine healing: Secondary | ICD-10-CM

## 2018-08-24 DIAGNOSIS — R2681 Unsteadiness on feet: Secondary | ICD-10-CM | POA: Diagnosis not present

## 2018-08-24 DIAGNOSIS — Z79899 Other long term (current) drug therapy: Secondary | ICD-10-CM | POA: Diagnosis not present

## 2018-08-24 DIAGNOSIS — M6281 Muscle weakness (generalized): Secondary | ICD-10-CM | POA: Diagnosis not present

## 2018-08-24 DIAGNOSIS — I38 Endocarditis, valve unspecified: Secondary | ICD-10-CM

## 2018-08-24 DIAGNOSIS — Z79891 Long term (current) use of opiate analgesic: Secondary | ICD-10-CM | POA: Diagnosis not present

## 2018-08-24 DIAGNOSIS — S32010S Wedge compression fracture of first lumbar vertebra, sequela: Secondary | ICD-10-CM

## 2018-08-24 DIAGNOSIS — F0391 Unspecified dementia with behavioral disturbance: Secondary | ICD-10-CM | POA: Diagnosis not present

## 2018-08-24 DIAGNOSIS — F17210 Nicotine dependence, cigarettes, uncomplicated: Secondary | ICD-10-CM | POA: Diagnosis not present

## 2018-08-24 NOTE — Assessment & Plan Note (Signed)
Patient is currently on atorvastatin, Coreg, and Lasix 40 mg daily.  Per him and his daughter for the past 1-1/2 weeks patient has been having issues with going to sleep, some worsening shortness of breath, lower extremity edema, and a weight gain of 6 pounds over 1 week, weight changed from 2 82-94.  He denies any orthopnea, or PND.  The daughter reports that he has needed to increase his Lasix since he had been diagnosed late last year.  Given his findings there is a concern that he may have a exacerbation of his heart failure and it would be good to reevaluate him in the clinic.  -We will continue patient's current medication regimen -We will send a message to the front desk to arrange in person clinic visit

## 2018-08-24 NOTE — Progress Notes (Signed)
  [facility] Telephone Encounter Continuity Care Appointment  HPI:   This telephone encounter was created for [name] on 08/24/2018 for the following purpose/cc sleeping issues, back and right hip pain, shortness of breath, weight gain, and LE edema.   Past Medical History:  Past Medical History:  Diagnosis Date  . COPD (chronic obstructive pulmonary disease) ([place])   . Diabetes mellitus without complication ([place])   . H/O diverticulitis of colon 11/03/2012   History of partial colectomy in 2011.  . Mechanical heart valve present   . Overdose of muscle relaxant 08/11/2017  . Pre-diabetes   . Retroperitoneal hematoma 01/29/2018  . S/P AVR (aortic valve replacement)   . Seizures ([place])   . 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 Denies fevers, chills, nausea, vomiting, headaches, urinary issues, or orthopnea.    Assessment / Plan / Recommendations:   Please see A&P under problem oriented charting for assessment of the patient's acute and chronic medical conditions.   As always, pt is advised that if symptoms worsen or new symptoms arise, they should go to an urgent care facility or to to ER for further evaluation.   Consent and Medical Decision Making:   Patient discussed with Dr. Dareen Piano  This is a telephone encounter between Adam Savage and Adam Savage on 08/24/2018 for sleeping issues, back and right hip pain, shortness of breath, weight gain, and LE edema. The visit was conducted with the patient located at home and Adam Savage at [facility]. The patient's identity was confirmed using their DOB and current address. The patient has consented to being evaluated through a telephone encounter and understands the associated risks (an examination cannot be done and the patient may need to come in for an appointment) /  benefits (allows the patient to remain at home, decreasing exposure to coronavirus). I personally spent 20 minutes on medical discussion.

## 2018-08-24 NOTE — Assessment & Plan Note (Signed)
Patient has a history of a compression fracture and has been taking opioids since that time.  He is getting physical therapy, he was not getting it for a few weeks due to social distancing but restarted it last week, he reports that he is having more pain and it is worse with he gets physical therapy.  He also gets very tired and exhausted after his PT.  Patient reports that he still having lower back pain, it is a numb feeling and is now a 5 out of 10, he has been taking the Norco about twice a day, and reports that helps a little bit but it causes him to be very tired.  They are requesting refills on these medications, patient will be coming into the clinic to be evaluated and can discuss refilling his opioid medications at that time.  Patient did see Dr. Luna Glasgow, the orthopedics doctor, in the past, patient reports that he did not want to have surgery and did not want to return to Dr. Luna Glasgow.  We discussed that there are not many other options for the patient regarding the compression fracture, he is already on opioids and getting physical therapy.  We discussed that I think it would be best for him to follow-up with orthopedics to see if they have any other treatment options that they can discuss and they are in agreement.   -We will evaluate in clinic, and may refill Norco after discussing this with patient -Follow up with orthopedics

## 2018-08-25 ENCOUNTER — Telehealth: Payer: Self-pay | Admitting: Internal Medicine

## 2018-08-25 NOTE — Telephone Encounter (Signed)
Spoke with Caryl Pina, patient's daughter, would like Dr. Truman Hayward to give verbal orders for home health nurse to come out check his legs and do blood work if needed due to COVID-19 and lack of child care on her part.  He is currently using [facility] for PT.  If this not option please give her a call back.  The number for [facility] is [phone] and fax # [phone].

## 2018-08-25 NOTE — Telephone Encounter (Signed)
Per Dr. [name] patient needs to come in for an appt.  Tried to call daughter back to schedule, but no answer.  Left message asking to please give me a call back.

## 2018-08-26 ENCOUNTER — Ambulatory Visit (INDEPENDENT_AMBULATORY_CARE_PROVIDER_SITE_OTHER): Payer: Medicare Other | Admitting: Internal Medicine

## 2018-08-26 ENCOUNTER — Other Ambulatory Visit: Payer: Self-pay

## 2018-08-26 VITALS — BP 110/46 | HR 56 | Temp 97.6°F | Ht 72.0 in | Wt 295.8 lb

## 2018-08-26 DIAGNOSIS — E119 Type 2 diabetes mellitus without complications: Secondary | ICD-10-CM

## 2018-08-26 DIAGNOSIS — Z7901 Long term (current) use of anticoagulants: Secondary | ICD-10-CM

## 2018-08-26 DIAGNOSIS — Z79891 Long term (current) use of opiate analgesic: Secondary | ICD-10-CM

## 2018-08-26 DIAGNOSIS — G4700 Insomnia, unspecified: Secondary | ICD-10-CM | POA: Diagnosis not present

## 2018-08-26 DIAGNOSIS — M4856XD Collapsed vertebra, not elsewhere classified, lumbar region, subsequent encounter for fracture with routine healing: Secondary | ICD-10-CM

## 2018-08-26 DIAGNOSIS — I5030 Unspecified diastolic (congestive) heart failure: Secondary | ICD-10-CM

## 2018-08-26 DIAGNOSIS — Z794 Long term (current) use of insulin: Secondary | ICD-10-CM | POA: Diagnosis not present

## 2018-08-26 DIAGNOSIS — G4701 Insomnia due to medical condition: Secondary | ICD-10-CM

## 2018-08-26 DIAGNOSIS — Z79899 Other long term (current) drug therapy: Secondary | ICD-10-CM

## 2018-08-26 DIAGNOSIS — F02818 Dementia in other diseases classified elsewhere, unspecified severity, with other behavioral disturbance: Secondary | ICD-10-CM

## 2018-08-26 DIAGNOSIS — F0281 Dementia in other diseases classified elsewhere with behavioral disturbance: Secondary | ICD-10-CM

## 2018-08-26 DIAGNOSIS — S32010S Wedge compression fracture of first lumbar vertebra, sequela: Secondary | ICD-10-CM

## 2018-08-26 DIAGNOSIS — F17210 Nicotine dependence, cigarettes, uncomplicated: Secondary | ICD-10-CM | POA: Diagnosis not present

## 2018-08-26 DIAGNOSIS — I38 Endocarditis, valve unspecified: Secondary | ICD-10-CM

## 2018-08-26 MED ORDER — FUROSEMIDE 20 MG PO TABS: 40.0000 mg | ORAL_TABLET | Freq: Two times a day (BID) | ORAL | 0 refills | Status: DC

## 2018-08-26 MED ORDER — INSULIN DETEMIR 100 UNIT/ML ~~LOC~~ SOLN: 18.0000 [IU] | Freq: Every day | SUBCUTANEOUS | 1 refills | Status: DC

## 2018-08-26 MED ORDER — QUETIAPINE FUMARATE 50 MG PO TABS: ORAL_TABLET | ORAL | 2 refills | Status: DC

## 2018-08-26 MED ORDER — HYDROCODONE-ACETAMINOPHEN 5-325 MG PO TABS: 1.0000 | ORAL_TABLET | Freq: Two times a day (BID) | ORAL | 0 refills | Status: DC | PRN

## 2018-08-26 NOTE — Progress Notes (Signed)
Internal Medicine Clinic Attending  Case discussed with Dr. Krienke at the time of the visit.  We reviewed the resident's history and exam and pertinent patient test results.  I agree with the assessment, diagnosis, and plan of care documented in the resident's note.    

## 2018-08-26 NOTE — Patient Instructions (Signed)
Mr. Casimer Russett,  It was a pleasure to see you today. Thank you for coming in.   Today we discussed your diabetes, heart failure, back pain, and sleep issues.  For your back pain, please continue using Norco as needed for pain control.  Continue with your physical therapy and follow-up with orthopedic surgeons.  I have sent in the prescription for the pain medication.   For your sleep issues, this appears to be more related to the pain however we will restart the previous dose of your Seroquel.  Please start taking 50 mg of Seroquel once in the morning, once in the afternoon, and then at night take 100 mg.  For your diabetes it seems like your blood sugars have been little elevated, please increase the Levemir by 2 units per week when your average blood sugars are>200.  Continue using the NovoLog as prescribed.  For your heart failure it seems like you may have a little bit of a mild exacerbation, please increase your Lasix to 40 mg twice a day for the next week.  Continue check your weight daily and give [place] call next week to let us know what your weight is.  Try to limit your fluid intake to <1.5 L/day(this includes all of your fluids), and limit your sodium intake to<2g per day.  Please return to clinic in 3 months or sooner if needed.   Thank you again for coming in.   Asencion Noble.D.

## 2018-08-27 ENCOUNTER — Ambulatory Visit (INDEPENDENT_AMBULATORY_CARE_PROVIDER_SITE_OTHER): Payer: Medicare Other

## 2018-08-27 ENCOUNTER — Encounter: Payer: Self-pay | Admitting: Orthopaedic Surgery

## 2018-08-27 ENCOUNTER — Ambulatory Visit (INDEPENDENT_AMBULATORY_CARE_PROVIDER_SITE_OTHER): Payer: Medicare Other | Admitting: Orthopaedic Surgery

## 2018-08-27 VITALS — Temp 97.1°F

## 2018-08-27 DIAGNOSIS — S32010D Wedge compression fracture of first lumbar vertebra, subsequent encounter for fracture with routine healing: Secondary | ICD-10-CM

## 2018-08-27 DIAGNOSIS — M5431 Sciatica, right side: Secondary | ICD-10-CM | POA: Diagnosis not present

## 2018-08-27 NOTE — Progress Notes (Signed)
   CC: Sleep disturbance, weight gain, labored breathing shortness of breath, back pain  HPI:  Adam Savage is a 68 y.o.   Past Medical History:  Diagnosis Date  . COPD (chronic obstructive pulmonary disease) ([place])   . Diabetes mellitus without complication ([place])   . H/O diverticulitis of colon 11/03/2012   History of partial colectomy in 2011.  . Mechanical heart valve present   . Overdose of muscle relaxant 08/11/2017  . Pre-diabetes   . Retroperitoneal hematoma 01/29/2018  . S/P AVR (aortic valve replacement)   . Seizures ([place])   . 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  Denies fevers, chills, nausea, vomiting, headaches, lightheadedness, dizziness, chest pain, or other symptoms.  Physical Exam:  Vitals:   08/26/18 1433  BP: (!) 110/46  Pulse: (!) 56  Temp: 97.6 F (36.4 C)  TempSrc: Oral  SpO2: 99%  Weight: 295 lb 12.8 oz (134.2 kg)  Height: 6' (1.829 m)   Physical Exam  Constitutional:  Tired appearing male, NAD, in wheelchair  Cardiovascular: Normal rate, regular rhythm and normal heart sounds.  Pulmonary/Chest: Effort normal.  Minimal bibasilar crackles  Abdominal: Soft. Bowel sounds are normal.  Musculoskeletal:        General: Edema (2+ BL LE edema) present.     Comments: Tenderness to palpation over lumbar spinal area  Skin: Skin is warm and dry.     Assessment & Plan:   See Encounters Tab for problem based charting.  Patient discussed with Dr. Daryll Drown

## 2018-08-27 NOTE — Assessment & Plan Note (Signed)
He is on atorvastatin, Coreg, Lasix 40 mg daily.  He was called 3 days ago in the daughter reported that for the past 2 weeks he was having issues with his sleep, worsening shortness of breath, lower extremity edema, and a weight gain.  Today they report that he still having some shortness of breath.  He does not eat a lot of processed foods, his daughter cooks his meals at home, he is not following a fluid restriction at this time.  The daughter reports that the patient has had similar issues in the past and had to be on higher doses of Lasix for a short time. Today his weight is up 20 pounds from his last visit at 295, on exam he has bibasilar crackles and has 2+ bilateral pitting edema. He appears comfortable and has nonlabored breathing and his other vitals are stable.  Patient may have a mild failure exacerbation, possibly due to increased fluid intake.  On optimal [place].  Will increase his Lasix for a short time and reevaluate.  Plan: -Increase Lasix to 40 mg twice daily for 1 week, advised patient and daughter to contact clinic with his weights at that time.  If down close to his dry weight can return to 40 mg daily -Continue Coreg 6.25 mg twice daily  -Continue atorvastatin 10 mg daily  -Advised patient to limit Na to <2 g daily and fluid intake to <1.5 L.

## 2018-08-27 NOTE — Progress Notes (Signed)
Patient Adam Savage, male DOB:[birth year], 68 y.o. QPY:[id number]  Chief Complaint  Patient presents with  . Back Pain    HPI  Adam Savage is a 68 y.o. male who has compression fracture of the L1 vertebrae.  He has been doing well but has developed right sided sciatica with pain to the right lateral and dorsal foot.  He has no trauma.  The fracture is healing well by x-rays today. I will get MRI of the lumbar spine.   There is no height or weight on file to calculate BMI.  ROS  Review of Systems  Constitutional: Positive for activity change.  HENT: Positive for hearing loss.   Respiratory: Positive for cough and shortness of breath.   Cardiovascular: Positive for chest pain, palpitations and leg swelling.  Musculoskeletal: Positive for arthralgias and back pain.  Neurological: Positive for seizures.    All other systems reviewed and are negative.  The following is a summary of the past history medically, past history surgically, known current medicines, social history and family history.  This information is gathered electronically by the computer from prior information and documentation.  I review this each visit and have found including this information at this point in the chart is beneficial and informative.    Past Medical History:  Diagnosis Date  . COPD (chronic obstructive pulmonary disease) ([place])   . Diabetes mellitus without complication ([place])   . H/O diverticulitis of colon 11/03/2012   History of partial colectomy in 2011.  . Mechanical heart valve present   . Overdose of muscle relaxant 08/11/2017  . Pre-diabetes   . Retroperitoneal hematoma 01/29/2018  . S/P AVR (aortic valve replacement)   . Seizures ([place])   . 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 CARDIAC SURGERY    . CHOLECYSTECTOMY    . COLON SURGERY    . CORONARY/GRAFT ANGIOGRAPHY N/A 10/31/2017   Procedure: CORONARY/GRAFT ANGIOGRAPHY;   Surgeon: Nelva Bush, MD;  Location: [facility];  Service: Cardiovascular;  Laterality: N/A;  . ESOPHAGOGASTRODUODENOSCOPY (EGD) WITH PROPOFOL N/A 01/18/2018   Procedure: ESOPHAGOGASTRODUODENOSCOPY (EGD) WITH PROPOFOL;  Surgeon: Mauri Pole, MD;  Location: [facility];  Service: Endoscopy;  Laterality: N/A;  . JOINT REPLACEMENT    . KNEE SURGERY Left     Family History  Problem Relation Age of Onset  . Hypertension Father     Social History Social History   Tobacco Use  . Smoking status: Current Every Day Smoker    Packs/day: 1.00    Types: Cigarettes  . Smokeless tobacco: Never Used  Substance Use Topics  . Alcohol use: Not Currently  . Drug use: Never    Allergies  Allergen Reactions  . Morphine And Related Other (See Comments)    Combative     Current Outpatient Medications  Medication Sig Dispense Refill  . atorvastatin (LIPITOR) 10 MG tablet TAKE 1 TABLET BY MOUTH EVERY DAY 90 tablet 0  . carvedilol (COREG) 6.25 MG tablet Take 1 tablet (6.25 mg total) by mouth 2 (two) times daily with a meal. 90 tablet 3  . docusate sodium (COLACE) 100 MG capsule Take 100 mg by mouth 2 (two) times daily.    Marland Kitchen escitalopram (LEXAPRO) 10 MG tablet Take 1 tablet (10 mg total) by mouth at bedtime. 90 tablet 3  . furosemide (LASIX) 20 MG tablet Take 2 tablets (40 mg total) by mouth 2 (two) times daily. 120 tablet 0  .  glucose blood test strip Use as instructed 100 each 12  . HYDROcodone-acetaminophen (NORCO) 5-325 MG tablet Take 1 tablet by mouth 2 (two) times daily as needed for moderate pain. 60 tablet 0  . insulin aspart (NOVOLOG) 100 UNIT/ML FlexPen Inject 10 Units into the skin 3 (three) times daily with meals. Adjust dose as needed 5 pen 3  . insulin detemir (LEVEMIR) 100 UNIT/ML injection Inject 0.18 mLs (18 Units total) into the skin at bedtime. Increase Levemir by 2 units when the week average is >200. 10 mL 1  . lactulose (CHRONULAC) 10 GM/15ML solution TAKE 15  MLS (10 G TOTAL) BY MOUTH DAILY. 473 mL 3  . levETIRAcetam (KEPPRA) 1000 MG tablet Take 1 tablet (1,000 mg total) by mouth 2 (two) times daily. 90 tablet 3  . Melatonin 3 MG TABS Take 1 tablet by mouth at bedtime as needed.    . memantine (NAMENDA) 5 MG tablet     . ondansetron (ZOFRAN) 4 MG tablet Take 1 tablet (4 mg total) by mouth every 8 (eight) hours as needed for nausea or vomiting. 15 tablet 0  . pantoprazole (PROTONIX) 40 MG tablet Take 1 tablet (40 mg total) by mouth daily. 90 tablet 3  . QUEtiapine (SEROQUEL) 50 MG tablet Take 1 tablet (50 mg total) by mouth 2 (two) times daily AND 2 tablets (100 mg total) at bedtime. 180 tablet 2  . warfarin (COUMADIN) 4 MG tablet Take 1 tablet (4 mg total) by mouth daily. 90 tablet 3   No current facility-administered medications for this visit.      Physical Exam  Temperature (!) 97.1 F (36.2 C).  Constitutional: overall normal hygiene, normal nutrition, well developed, normal grooming, normal body habitus. Assistive device: cane  Musculoskeletal: gait and station Limp rihgt, muscle tone and strength are normal, no tremors or atrophy is present.  .  Neurological: coordination overall normal.  Deep tendon reflex/nerve stretch intact.  Sensation normal.  Cranial nerves II-XII intact.   Skin:   Normal overall no scars, lesions, ulcers or rashes. No psoriasis.  Psychiatric: Alert and oriented x 3.  Recent memory intact, remote memory unclear.  Normal mood and affect. Well groomed.  Good eye contact.  Cardiovascular: overall no swelling, no varicosities, no edema bilaterally, normal temperatures of the legs and arms, no clubbing, cyanosis and good capillary refill.  Lymphatic: palpation is normal.  Lower back is tender, SLR negative, reflexes normal.  All other systems reviewed and are negative   The patient has been educated about the nature of the problem(s) and counseled on treatment options.  The patient appeared to understand what I  have discussed and is in agreement with it.  Encounter Diagnoses  Name Primary?  . Compression fracture of L1 vertebra with routine healing, subsequent encounter Yes  . Back pain with right-sided sciatica     PLAN Call if any problems.  Precautions discussed.  Continue current medications.   Return to clinic MRI lumbar spine   Electronically Signed Sanjuana Kava, MD 5/14/202010:05 AM

## 2018-08-27 NOTE — Assessment & Plan Note (Addendum)
Patient is currently on Levemir 16 units daily with a NovoLog 10 units taken when his CBGs are >150.  Daughter reports that for a few weeks the patient's CBGs have been around 200 on average and he is almost always getting the NovoLog.  They report that due to his issues with his sleep he has been snacking more, especially at night.    Plan:  -Will address his sleep issues which may help with his diet -Advised patient to increase the Levemir by 2 units every week that is CBGs are >200 on average -Continue NovoLog 10 units when CBG>150 at mealtimes

## 2018-08-27 NOTE — Assessment & Plan Note (Signed)
Patient has a history of compression fracture and is been taking opioids since that.  Reports that the pain is over his lower back, some numb feeling, it is always there and worse when he is lying in certain positions.  Reports that he has pain when he wakes up, is now waking up every 1-2 hours. He is getting physical therapy which he reports helps loosen up the muscles but will only last for a few days before they tighten back up, he reports that the Norco helps a little bit, he is currently taking 1 in the morning and 1 at night.  Patient has an appointment with orthopedics tomorrow to discuss other options.  Plan: -We will refill Norco 5-325 BID PRN #60 at this time -Follow-up with orthopedics

## 2018-08-27 NOTE — Assessment & Plan Note (Signed)
Patient and daughter reports that patient has been having issues with his sleep since he has had this compression fracture. They report that he wakes up every 1-2 hours, he states that he has difficulty finding a good position because of the pain will start.  He is currently taking his pain medications at night which he reports helps with the pain a little bit.  Patient denies any issues falling asleep.  Daughter reports that he had been on a higher dose of Seroquel and was taking 50 mg in the morning and afternoon as well as 100 mg before bedtime. He reports that that 100 mg before bedtime helped with his sleep and is requesting to try that again.  Evaluated patient's prior EKG and no significant QT prolongation was noted.  Not sure why the Seroquel was decreased however I think it will be reasonable to restart his previous dose.  Will need to address his pain since this is likely the cause of his sleep disturbance.  Plan: -Start Seroquel 50 mg in a.m. and afternoon, and 100 mg at night

## 2018-08-30 ENCOUNTER — Other Ambulatory Visit: Payer: Self-pay | Admitting: Internal Medicine

## 2018-08-30 DIAGNOSIS — I5030 Unspecified diastolic (congestive) heart failure: Secondary | ICD-10-CM

## 2018-08-30 DIAGNOSIS — I38 Endocarditis, valve unspecified: Secondary | ICD-10-CM

## 2018-08-31 ENCOUNTER — Telehealth: Payer: Self-pay | Admitting: Orthopaedic Surgery

## 2018-08-31 NOTE — Progress Notes (Signed)
Internal Medicine Clinic Attending  Case discussed with Dr. Krienke at the time of the visit.  We reviewed the resident's history and exam and pertinent patient test results.  I agree with the assessment, diagnosis, and plan of care documented in the resident's note.    

## 2018-08-31 NOTE — Telephone Encounter (Signed)
Call received (voice message) from physical therapist at [facility]; states patient has some demential, and does not move around much. Asking about some limited exercises/home therapy - please call [phone].

## 2018-09-01 NOTE — Telephone Encounter (Signed)
Attempted to leave VM but mailbox was full.

## 2018-09-01 NOTE — Telephone Encounter (Signed)
Yes they can do some limited exercises, get him moving some as tolerated.

## 2018-09-02 ENCOUNTER — Telehealth: Payer: Self-pay | Admitting: Internal Medicine

## 2018-09-02 DIAGNOSIS — M6281 Muscle weakness (generalized): Secondary | ICD-10-CM | POA: Diagnosis not present

## 2018-09-02 DIAGNOSIS — F0391 Unspecified dementia with behavioral disturbance: Secondary | ICD-10-CM | POA: Diagnosis not present

## 2018-09-02 DIAGNOSIS — R2681 Unsteadiness on feet: Secondary | ICD-10-CM | POA: Diagnosis not present

## 2018-09-02 DIAGNOSIS — F02818 Dementia in other diseases classified elsewhere, unspecified severity, with other behavioral disturbance: Secondary | ICD-10-CM

## 2018-09-02 DIAGNOSIS — I69311 Memory deficit following cerebral infarction: Secondary | ICD-10-CM | POA: Diagnosis not present

## 2018-09-02 DIAGNOSIS — I110 Hypertensive heart disease with heart failure: Secondary | ICD-10-CM | POA: Diagnosis not present

## 2018-09-02 DIAGNOSIS — I38 Endocarditis, valve unspecified: Secondary | ICD-10-CM

## 2018-09-02 DIAGNOSIS — I69398 Other sequelae of cerebral infarction: Secondary | ICD-10-CM | POA: Diagnosis not present

## 2018-09-02 DIAGNOSIS — I5030 Unspecified diastolic (congestive) heart failure: Secondary | ICD-10-CM

## 2018-09-02 DIAGNOSIS — F0281 Dementia in other diseases classified elsewhere with behavioral disturbance: Secondary | ICD-10-CM

## 2018-09-02 DIAGNOSIS — G40909 Epilepsy, unspecified, not intractable, without status epilepticus: Secondary | ICD-10-CM

## 2018-09-02 DIAGNOSIS — B182 Chronic viral hepatitis C: Secondary | ICD-10-CM

## 2018-09-02 NOTE — Telephone Encounter (Signed)
Patient's daughter requesitng [facility] services for Nursing with [facility] who he rec'd his PT from.  Daughter states PT as well is requesting Nursing due to recurrent rash on both legs. Per daughter both legs are red with blotches.  Please contact the daughter as soon as possible

## 2018-09-02 NOTE — Telephone Encounter (Signed)
Pt's daughter request for [facility] for disease management and medication management.  This rash has happened before and been evaluated, daughter states they never determined true cause.

## 2018-09-02 NOTE — Telephone Encounter (Signed)
Notified [facility] therapist of recommendations. No further questions or concerns at this time.

## 2018-09-03 NOTE — Telephone Encounter (Signed)
Tried to give them a call but did not pick up. If these rashes are similar to the ones he had in prior hospitalization, they were biopsied in the past and was determined to be due to his untreated Hep C. He needs to f/u with ID to start treatment but daughter mentioned they were having hard time visiting specialists due to COVID-19. I'll put in the order for [facility] for medication management.

## 2018-09-04 ENCOUNTER — Telehealth: Payer: Self-pay | Admitting: Internal Medicine

## 2018-09-04 DIAGNOSIS — I69398 Other sequelae of cerebral infarction: Secondary | ICD-10-CM | POA: Diagnosis not present

## 2018-09-04 DIAGNOSIS — F0391 Unspecified dementia with behavioral disturbance: Secondary | ICD-10-CM | POA: Diagnosis not present

## 2018-09-04 DIAGNOSIS — R2681 Unsteadiness on feet: Secondary | ICD-10-CM | POA: Diagnosis not present

## 2018-09-04 DIAGNOSIS — M6281 Muscle weakness (generalized): Secondary | ICD-10-CM | POA: Diagnosis not present

## 2018-09-04 DIAGNOSIS — I69311 Memory deficit following cerebral infarction: Secondary | ICD-10-CM | POA: Diagnosis not present

## 2018-09-04 DIAGNOSIS — I110 Hypertensive heart disease with heart failure: Secondary | ICD-10-CM | POA: Diagnosis not present

## 2018-09-04 NOTE — Telephone Encounter (Signed)
Pt daughter is following up on dads home health service; [phone]

## 2018-09-04 NOTE — Telephone Encounter (Signed)
Telephone call returned to Patients daughter about [place] for her father. The patient has used [facility],her father is getting PT with them. I called the company [phone] and was told to fax the [place] to [phone]

## 2018-09-08 ENCOUNTER — Telehealth: Payer: Self-pay

## 2018-09-08 DIAGNOSIS — R2681 Unsteadiness on feet: Secondary | ICD-10-CM | POA: Diagnosis not present

## 2018-09-08 DIAGNOSIS — I110 Hypertensive heart disease with heart failure: Secondary | ICD-10-CM | POA: Diagnosis not present

## 2018-09-08 DIAGNOSIS — F0391 Unspecified dementia with behavioral disturbance: Secondary | ICD-10-CM | POA: Diagnosis not present

## 2018-09-08 DIAGNOSIS — I69311 Memory deficit following cerebral infarction: Secondary | ICD-10-CM | POA: Diagnosis not present

## 2018-09-08 DIAGNOSIS — M6281 Muscle weakness (generalized): Secondary | ICD-10-CM | POA: Diagnosis not present

## 2018-09-08 DIAGNOSIS — I69398 Other sequelae of cerebral infarction: Secondary | ICD-10-CM | POA: Diagnosis not present

## 2018-09-08 NOTE — Telephone Encounter (Signed)
Adam Savage with [facility] requesting to speak with a nurse. Please call back.

## 2018-09-08 NOTE — Telephone Encounter (Signed)
Referral to [facility] RN along with chart notes and demographics faxed to [facility] by M. Hague on 09/04/2018. Confirmation fax received. Hubbard Hartshorn, RN, BSN

## 2018-09-08 NOTE — Telephone Encounter (Signed)
[facility] calls and states pt is becoming larger in abd and has more labored breathing than normal. Lungs sound clear. Have called daughter , lm for rtc.

## 2018-09-09 NOTE — Telephone Encounter (Signed)
Requesting to speak with Adam Savage. Please call back.  

## 2018-09-10 ENCOUNTER — Ambulatory Visit (INDEPENDENT_AMBULATORY_CARE_PROVIDER_SITE_OTHER): Payer: Medicare Other | Admitting: *Deleted

## 2018-09-10 ENCOUNTER — Telehealth: Payer: Self-pay | Admitting: *Deleted

## 2018-09-10 DIAGNOSIS — Z5181 Encounter for therapeutic drug level monitoring: Secondary | ICD-10-CM

## 2018-09-10 DIAGNOSIS — I110 Hypertensive heart disease with heart failure: Secondary | ICD-10-CM | POA: Diagnosis not present

## 2018-09-10 DIAGNOSIS — I69311 Memory deficit following cerebral infarction: Secondary | ICD-10-CM | POA: Diagnosis not present

## 2018-09-10 DIAGNOSIS — I69398 Other sequelae of cerebral infarction: Secondary | ICD-10-CM | POA: Diagnosis not present

## 2018-09-10 DIAGNOSIS — R2681 Unsteadiness on feet: Secondary | ICD-10-CM | POA: Diagnosis not present

## 2018-09-10 DIAGNOSIS — Z952 Presence of prosthetic heart valve: Secondary | ICD-10-CM

## 2018-09-10 DIAGNOSIS — F0391 Unspecified dementia with behavioral disturbance: Secondary | ICD-10-CM | POA: Diagnosis not present

## 2018-09-10 DIAGNOSIS — M6281 Muscle weakness (generalized): Secondary | ICD-10-CM | POA: Diagnosis not present

## 2018-09-10 LAB — POCT INR: INR: 2.3 (ref 2.0–3.0)

## 2018-09-10 NOTE — Telephone Encounter (Signed)
I agree with Dr. Truman Hayward

## 2018-09-10 NOTE — Patient Instructions (Signed)
Continue coumadin 4mg  daily.  Recheck INR in 2 weeks.  Order given to [employer]

## 2018-09-10 NOTE — Telephone Encounter (Signed)
Okay 

## 2018-09-10 NOTE — Telephone Encounter (Signed)
INR 2.3  PT 26.7  Please call [facility] [phone]

## 2018-09-10 NOTE — Telephone Encounter (Signed)
Spoke with pt's daughter, Adam Savage about Adam Savage's condition. She states nurse informed her that Adam Savage has been having increasing abdominal girth and weight gain. She mentions that yesterday his weight has actually decreased although it has been overall trending up. She mentions that he reports no dyspnea, chest pain, palpitations, increased confusions, fevers, chills, nausea, vomiting abdominal pain or jaundice. She states he is taking furosemide 40mg  BID as prescribed with good urinary output and his lower extremity swelling has significantly improved since two weeks ago. She also mentions he is continuing to take his lactulose as prescribed with good stool output. Discussed in detail about possible differential of ascites vs heart failure exacerbation and need for in-person evaluation for confirmation and treatment options. Described red flags for urgent visit to ED such as fevers, chills, dyspnea, hypotension and recommend close observation throughout the weekend. Adam Savage expressed understanding.

## 2018-09-10 NOTE — Telephone Encounter (Signed)
Call from pt's daughter, [name], stated pt is retaining fluid and [facility] suggested he might need fluid removed and to call his PCP office. We do not have any appts until next week. Informed her he needs to come to the ED. Stated she will call EMS; bring him to [facility].

## 2018-09-10 NOTE — Telephone Encounter (Signed)
Done.  See coumadin note. 

## 2018-09-10 NOTE — Telephone Encounter (Signed)
Pt daughter calls back- stated she prefers aa in person visit for her father (aware it will be next week); instead of taking him to the ED. She does not want him waiting hours in the ED; and with restrictions, she will not be able to stay with him; afraid he will not answer any questions and become agitated; also she has a child and no child care. Stated if his wt increases retaining more fluid or any change in condition will she bring to the ED.  No tele health appts available ;[facility] appt scheduled Tues June 2 @ 1045 AM. Please  Advise.

## 2018-09-11 DIAGNOSIS — I69311 Memory deficit following cerebral infarction: Secondary | ICD-10-CM | POA: Diagnosis not present

## 2018-09-11 DIAGNOSIS — I110 Hypertensive heart disease with heart failure: Secondary | ICD-10-CM | POA: Diagnosis not present

## 2018-09-11 DIAGNOSIS — I69398 Other sequelae of cerebral infarction: Secondary | ICD-10-CM | POA: Diagnosis not present

## 2018-09-11 DIAGNOSIS — F0391 Unspecified dementia with behavioral disturbance: Secondary | ICD-10-CM | POA: Diagnosis not present

## 2018-09-11 DIAGNOSIS — M6281 Muscle weakness (generalized): Secondary | ICD-10-CM | POA: Diagnosis not present

## 2018-09-11 DIAGNOSIS — R2681 Unsteadiness on feet: Secondary | ICD-10-CM | POA: Diagnosis not present

## 2018-09-11 LAB — POCT URINE DIPSTICK: POTASSIUM: 4.9 mmol/L (ref 3.4–5.1)

## 2018-09-15 ENCOUNTER — Ambulatory Visit (INDEPENDENT_AMBULATORY_CARE_PROVIDER_SITE_OTHER): Payer: Medicare Other | Admitting: Internal Medicine

## 2018-09-15 ENCOUNTER — Other Ambulatory Visit: Payer: Self-pay

## 2018-09-15 VITALS — BP 113/45 | HR 61 | Ht 72.0 in | Wt 307.3 lb

## 2018-09-15 DIAGNOSIS — M6281 Muscle weakness (generalized): Secondary | ICD-10-CM | POA: Diagnosis not present

## 2018-09-15 DIAGNOSIS — B182 Chronic viral hepatitis C: Secondary | ICD-10-CM | POA: Diagnosis not present

## 2018-09-15 DIAGNOSIS — F17210 Nicotine dependence, cigarettes, uncomplicated: Secondary | ICD-10-CM

## 2018-09-15 DIAGNOSIS — I5032 Chronic diastolic (congestive) heart failure: Secondary | ICD-10-CM | POA: Diagnosis not present

## 2018-09-15 DIAGNOSIS — I69398 Other sequelae of cerebral infarction: Secondary | ICD-10-CM | POA: Diagnosis not present

## 2018-09-15 DIAGNOSIS — I69311 Memory deficit following cerebral infarction: Secondary | ICD-10-CM | POA: Diagnosis not present

## 2018-09-15 DIAGNOSIS — Z7901 Long term (current) use of anticoagulants: Secondary | ICD-10-CM

## 2018-09-15 DIAGNOSIS — I110 Hypertensive heart disease with heart failure: Secondary | ICD-10-CM | POA: Diagnosis not present

## 2018-09-15 DIAGNOSIS — R2681 Unsteadiness on feet: Secondary | ICD-10-CM | POA: Diagnosis not present

## 2018-09-15 DIAGNOSIS — K7460 Unspecified cirrhosis of liver: Secondary | ICD-10-CM

## 2018-09-15 DIAGNOSIS — Z79899 Other long term (current) drug therapy: Secondary | ICD-10-CM

## 2018-09-15 DIAGNOSIS — Z87891 Personal history of nicotine dependence: Secondary | ICD-10-CM | POA: Diagnosis not present

## 2018-09-15 DIAGNOSIS — Z794 Long term (current) use of insulin: Secondary | ICD-10-CM | POA: Diagnosis not present

## 2018-09-15 DIAGNOSIS — F0391 Unspecified dementia with behavioral disturbance: Secondary | ICD-10-CM | POA: Diagnosis not present

## 2018-09-15 DIAGNOSIS — E119 Type 2 diabetes mellitus without complications: Secondary | ICD-10-CM | POA: Diagnosis not present

## 2018-09-15 DIAGNOSIS — J449 Chronic obstructive pulmonary disease, unspecified: Secondary | ICD-10-CM | POA: Diagnosis not present

## 2018-09-15 MED ORDER — SPIRONOLACTONE 50 MG PO TABS: 50.0000 mg | ORAL_TABLET | Freq: Every day | ORAL | 2 refills | Status: DC

## 2018-09-15 NOTE — Progress Notes (Signed)
   CC: swelling, weight gain  HPI:  [name] is a 68 y.o. gentleman with PMHx listed below who presents for increased abdominal girth and weight gain. He is accompanied by his daughter who is his primary caretaker.  Patient was in clinic on 5/13 for similar complaints. His Lasix was increased from 40 mg daily to 40 mg BID. His daughter states that the swelling in his legs seems to have improved, but his weight continues to increase and his abdomen seems more swollen. Also endorses DOE. Denies orthopnea, chest pain, abdominal pain, n/v, changes in bowel movements.   Past Medical History:  Diagnosis Date  . COPD (chronic obstructive pulmonary disease) ([place])   . Diabetes mellitus without complication ([place])   . H/O diverticulitis of colon 11/03/2012   History of partial colectomy in 2011.  . Mechanical heart valve present   . Overdose of muscle relaxant 08/11/2017  . Pre-diabetes   . Retroperitoneal hematoma 01/29/2018  . S/P AVR (aortic valve replacement)   . Seizures ([place])   . 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    Physical Exam:  Vitals:   09/15/18 1101  BP: (!) 113/45  Pulse: 61  SpO2: 97%  Weight: (!) 307 lb 4.8 oz (139.4 kg)  Height: 6' (1.829 m)   Physical Exam Constitutional:      General: He is not in acute distress.    Appearance: He is obese.  Cardiovascular:     Rate and Rhythm: Normal rate and regular rhythm.  Pulmonary:     Effort: Pulmonary effort is normal.     Breath sounds: Normal breath sounds.  Abdominal:     General: There is distension.     Tenderness: There is no abdominal tenderness.  Musculoskeletal:     Right lower leg: Edema present.     Left lower leg: Edema present.  Skin:    Comments: Scattered plaques of non-blanching palpable purpura on bilateral lower extremities   Neurological:     Mental Status: He is alert.     Assessment & Plan:   See Encounters Tab for problem based charting.  Patient discussed with Dr. Daryll Drown

## 2018-09-15 NOTE — Patient Instructions (Signed)
Adam Savage, It was a pleasure meeting you.   We discussed your fluid build up and weight gain. I think it is likely more related to your liver disease. I'd like you to start taking Spironolactone 50 mg daily and go back down to your Lasix 40 mg once daily.   We will see you back in 2 weeks to see how you are doing and do some lab work.    Please call sooner if you have any questions or concerns.   Take care! Dr. Koleen Distance

## 2018-09-16 ENCOUNTER — Encounter: Payer: Self-pay | Admitting: Internal Medicine

## 2018-09-16 DIAGNOSIS — M6281 Muscle weakness (generalized): Secondary | ICD-10-CM | POA: Diagnosis not present

## 2018-09-16 DIAGNOSIS — R2681 Unsteadiness on feet: Secondary | ICD-10-CM | POA: Diagnosis not present

## 2018-09-16 DIAGNOSIS — I69398 Other sequelae of cerebral infarction: Secondary | ICD-10-CM | POA: Diagnosis not present

## 2018-09-16 DIAGNOSIS — I110 Hypertensive heart disease with heart failure: Secondary | ICD-10-CM | POA: Diagnosis not present

## 2018-09-16 DIAGNOSIS — B182 Chronic viral hepatitis C: Secondary | ICD-10-CM | POA: Insufficient documentation

## 2018-09-16 DIAGNOSIS — I69311 Memory deficit following cerebral infarction: Secondary | ICD-10-CM | POA: Diagnosis not present

## 2018-09-16 DIAGNOSIS — F0391 Unspecified dementia with behavioral disturbance: Secondary | ICD-10-CM | POA: Diagnosis not present

## 2018-09-16 NOTE — Assessment & Plan Note (Signed)
Patient presents with several weeks of progressive weight gain and edema. He has not had much improvement since the increased Lasix dose. Suspect his symptoms may be more related to cirrhosis than HFpEF. Will decrease Lasix back to 40 mg daily and add Spironolactone 50 mg daily. If blood pressure tolerates and potassium remains stable, can up-titrate as needed. Will follow-up in 2 weeks to re-assess symptoms and to check a BMP.

## 2018-09-17 ENCOUNTER — Other Ambulatory Visit: Payer: Self-pay

## 2018-09-17 ENCOUNTER — Telehealth (INDEPENDENT_AMBULATORY_CARE_PROVIDER_SITE_OTHER): Payer: Medicare Other | Admitting: Neurology

## 2018-09-17 ENCOUNTER — Telehealth: Payer: Self-pay | Admitting: Internal Medicine

## 2018-09-17 DIAGNOSIS — R2681 Unsteadiness on feet: Secondary | ICD-10-CM | POA: Diagnosis not present

## 2018-09-17 DIAGNOSIS — G40009 Localization-related (focal) (partial) idiopathic epilepsy and epileptic syndromes with seizures of localized onset, not intractable, without status epilepticus: Secondary | ICD-10-CM | POA: Diagnosis not present

## 2018-09-17 DIAGNOSIS — I110 Hypertensive heart disease with heart failure: Secondary | ICD-10-CM | POA: Diagnosis not present

## 2018-09-17 DIAGNOSIS — F0281 Dementia in other diseases classified elsewhere with behavioral disturbance: Secondary | ICD-10-CM

## 2018-09-17 DIAGNOSIS — I69398 Other sequelae of cerebral infarction: Secondary | ICD-10-CM | POA: Diagnosis not present

## 2018-09-17 DIAGNOSIS — I69311 Memory deficit following cerebral infarction: Secondary | ICD-10-CM | POA: Diagnosis not present

## 2018-09-17 DIAGNOSIS — G4700 Insomnia, unspecified: Secondary | ICD-10-CM

## 2018-09-17 DIAGNOSIS — F0391 Unspecified dementia with behavioral disturbance: Secondary | ICD-10-CM | POA: Diagnosis not present

## 2018-09-17 DIAGNOSIS — M6281 Muscle weakness (generalized): Secondary | ICD-10-CM | POA: Diagnosis not present

## 2018-09-17 DIAGNOSIS — F02818 Dementia in other diseases classified elsewhere, unspecified severity, with other behavioral disturbance: Secondary | ICD-10-CM

## 2018-09-17 MED ORDER — QUETIAPINE FUMARATE 100 MG PO TABS: ORAL_TABLET | ORAL | 11 refills | Status: DC

## 2018-09-17 MED ORDER — MEMANTINE HCL 5 MG PO TABS: 5.0000 mg | ORAL_TABLET | Freq: Two times a day (BID) | ORAL | 3 refills | Status: DC

## 2018-09-17 MED ORDER — LEVETIRACETAM 1000 MG PO TABS: 1000.0000 mg | ORAL_TABLET | Freq: Two times a day (BID) | ORAL | 3 refills | Status: DC

## 2018-09-17 NOTE — Telephone Encounter (Signed)
Pt's daughter called to report per [facility] nurse her father is up 4 pounds in the past 24 hours.

## 2018-09-17 NOTE — Telephone Encounter (Signed)
Discussed with Ms. Caryl Pina about his father's weight gain and recommended increasing dose of spironolactone to 100mg  to meet 100:40 spironolactone to furosemide ratio and monitoring closely for hypotension. Ms.Ashley expressed understanding. Also discussed in detail about Mr.Dollens's declining mental health and caregiver fatigue. Discussed her current plan of placing Mr.Mcglory in assisted living facility and needing paperwork filled out for this process. Confirmed that he has an upcoming appointment me and discussed getting all the necessary testing done at that visit. All other concerns addressed.
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
## 2018-09-18 DIAGNOSIS — M6281 Muscle weakness (generalized): Secondary | ICD-10-CM | POA: Diagnosis not present

## 2018-09-18 DIAGNOSIS — I69311 Memory deficit following cerebral infarction: Secondary | ICD-10-CM | POA: Diagnosis not present

## 2018-09-18 DIAGNOSIS — I69398 Other sequelae of cerebral infarction: Secondary | ICD-10-CM | POA: Diagnosis not present

## 2018-09-18 DIAGNOSIS — F0391 Unspecified dementia with behavioral disturbance: Secondary | ICD-10-CM | POA: Diagnosis not present

## 2018-09-18 DIAGNOSIS — R2681 Unsteadiness on feet: Secondary | ICD-10-CM | POA: Diagnosis not present

## 2018-09-18 DIAGNOSIS — I110 Hypertensive heart disease with heart failure: Secondary | ICD-10-CM | POA: Diagnosis not present

## 2018-09-18 NOTE — Progress Notes (Signed)
Internal Medicine Clinic Attending  Case discussed with Dr. Bloomfield at the time of the visit.  We reviewed the resident's history and exam and pertinent patient test results.  I agree with the assessment, diagnosis, and plan of care documented in the resident's note.  

## 2018-09-21 ENCOUNTER — Ambulatory Visit: Payer: Self-pay | Admitting: Neurology

## 2018-09-21 DIAGNOSIS — I69398 Other sequelae of cerebral infarction: Secondary | ICD-10-CM | POA: Diagnosis not present

## 2018-09-21 DIAGNOSIS — I110 Hypertensive heart disease with heart failure: Secondary | ICD-10-CM | POA: Diagnosis not present

## 2018-09-21 DIAGNOSIS — R2681 Unsteadiness on feet: Secondary | ICD-10-CM | POA: Diagnosis not present

## 2018-09-21 DIAGNOSIS — I69311 Memory deficit following cerebral infarction: Secondary | ICD-10-CM | POA: Diagnosis not present

## 2018-09-21 DIAGNOSIS — M6281 Muscle weakness (generalized): Secondary | ICD-10-CM | POA: Diagnosis not present

## 2018-09-21 DIAGNOSIS — F0391 Unspecified dementia with behavioral disturbance: Secondary | ICD-10-CM | POA: Diagnosis not present

## 2018-09-24 ENCOUNTER — Other Ambulatory Visit: Payer: Self-pay | Admitting: Internal Medicine

## 2018-09-24 ENCOUNTER — Telehealth: Payer: Self-pay | Admitting: *Deleted

## 2018-09-24 ENCOUNTER — Ambulatory Visit (INDEPENDENT_AMBULATORY_CARE_PROVIDER_SITE_OTHER): Payer: Medicare Other | Admitting: *Deleted

## 2018-09-24 ENCOUNTER — Telehealth: Payer: Self-pay | Admitting: Neurology

## 2018-09-24 DIAGNOSIS — S32010S Wedge compression fracture of first lumbar vertebra, sequela: Secondary | ICD-10-CM

## 2018-09-24 DIAGNOSIS — Z5181 Encounter for therapeutic drug level monitoring: Secondary | ICD-10-CM

## 2018-09-24 DIAGNOSIS — I69398 Other sequelae of cerebral infarction: Secondary | ICD-10-CM | POA: Diagnosis not present

## 2018-09-24 DIAGNOSIS — M6281 Muscle weakness (generalized): Secondary | ICD-10-CM | POA: Diagnosis not present

## 2018-09-24 DIAGNOSIS — I110 Hypertensive heart disease with heart failure: Secondary | ICD-10-CM | POA: Diagnosis not present

## 2018-09-24 DIAGNOSIS — Z952 Presence of prosthetic heart valve: Secondary | ICD-10-CM

## 2018-09-24 DIAGNOSIS — F0391 Unspecified dementia with behavioral disturbance: Secondary | ICD-10-CM | POA: Diagnosis not present

## 2018-09-24 DIAGNOSIS — I69311 Memory deficit following cerebral infarction: Secondary | ICD-10-CM | POA: Diagnosis not present

## 2018-09-24 DIAGNOSIS — R2681 Unsteadiness on feet: Secondary | ICD-10-CM | POA: Diagnosis not present

## 2018-09-24 LAB — POCT INR: INR: 2.2 (ref 2.0–3.0)

## 2018-09-24 MED ORDER — HYDROCODONE-ACETAMINOPHEN 5-325 MG PO TABS: 1.0000 | ORAL_TABLET | Freq: Two times a day (BID) | ORAL | 0 refills | Status: DC | PRN

## 2018-09-24 NOTE — Telephone Encounter (Signed)
Pls let nurse know to reduce Seroquel back to what he was taking before, 150mg  at night. Thanks

## 2018-09-24 NOTE — Telephone Encounter (Signed)
Pt's home nurse Adam Savage called to inform that pt has been more disoriented since Dr. Delice Lesch increased seroquel to 200,g. And it has also altered his sleep. The would like some advise. Pls call her.

## 2018-09-24 NOTE — Telephone Encounter (Signed)
Called home health nurse back no answer called pt daughter Caryl Pina reduce Seroquel back to what he was taking before, 150mg  at night she verbalized understanding I will call back next week to follow up on how he is doing

## 2018-09-24 NOTE — Telephone Encounter (Signed)
LM on voice mail with coumadin instructions.

## 2018-09-24 NOTE — Telephone Encounter (Signed)
Adam Savage from [facility] called with INR 2.2   Please call [phone].

## 2018-09-24 NOTE — Patient Instructions (Signed)
Continue coumadin 4mg  daily.  Recheck INR in 2 weeks.  Order given to [facility]

## 2018-09-24 NOTE — Telephone Encounter (Signed)
Need refills on pain medicine; pt contact [phone]  [id number] - [place], [place]

## 2018-09-28 ENCOUNTER — Telehealth: Payer: Self-pay | Admitting: *Deleted

## 2018-09-28 ENCOUNTER — Other Ambulatory Visit: Payer: Self-pay | Admitting: Internal Medicine

## 2018-09-28 DIAGNOSIS — I5030 Unspecified diastolic (congestive) heart failure: Secondary | ICD-10-CM

## 2018-09-28 DIAGNOSIS — I38 Endocarditis, valve unspecified: Secondary | ICD-10-CM

## 2018-09-28 NOTE — Telephone Encounter (Signed)
Pt's daughter calls to say pt weighed 298 last wed, today 306 10th 298.6 11th 300.4 12th 301.8 13th 302.0 14th 305.0 15th 306.0 HHN was in Thursday, stated lungs clear bil, abd more firm and distended, feet are swollen with pitting edema, legs are not as swollen Has appt w/ dr comer [facility] 6/18 Daughter just wanted to report this since it is appr 8 lbs in 1 week

## 2018-09-28 NOTE — Telephone Encounter (Signed)
Discussed with Caryl Pina about his father's weight gain. She mentions that he has worsening abdominal distension with skin tightening but lower extremity edema is stable. She mentions that last week after increasing the spironolactone 100mg  last week down to 295lbs but his weight has steadily increased starting 09/24/18. She mentions that he has been having poor sleep and suspects he may have been snacking at night. Also mentions that the family has had take out couple times last week. Discussed the importance of following a low sodium diet with his history of cirrhosis and heart failure. Discussed resuming furosemide 40mg  BID from once daily as needed to control swelling and weight gain. Ms.Ashley expressed understanding.

## 2018-09-28 NOTE — Telephone Encounter (Signed)
Discussed with Ms.Caryl Pina about her concerns regarding Mr.Gaughan's weight gain. She also mentions increasing abdominal distension with tight skin. She mentions that he had been slowly losing weight after increasing the spironolactone dose to 100mg  last week with weight down to 295mg  and reduction in swelling but over the last 5 days having slowly gaining weight. She mentions that Mr.Cimini has been having poor night of sleep and suspects night time snacking when bored. Also mentioned that the family has been eating take out due to busy life style the last couple days. Discussed importance of following low sodium diet in setting of cirrhosis and heart failure. Discussed plan to increase back furosemide to 40mg  BID for now and f/u with me in 1 week. Ms.Ashley expressed understanding.

## 2018-09-30 ENCOUNTER — Telehealth: Payer: Self-pay | Admitting: Internal Medicine

## 2018-09-30 NOTE — Telephone Encounter (Signed)
COVID-19 Pre-Screening Questions: ° °Do you currently have a fever (>100 °F), chills or unexplained body aches? No  ° °Are you currently experiencing new cough, shortness of breath, sore throat, runny nose? No  °•  °Have you recently travelled outside the state of [place] in the last 14 days? No  °•  °1. Have you been in contact with someone that is currently pending confirmation of Covid19 testing or has been confirmed to have the Covid19 virus?  No  ° °

## 2018-10-01 ENCOUNTER — Encounter: Payer: Self-pay | Admitting: Internal Medicine

## 2018-10-01 ENCOUNTER — Ambulatory Visit (INDEPENDENT_AMBULATORY_CARE_PROVIDER_SITE_OTHER): Payer: Medicare Other | Admitting: Internal Medicine

## 2018-10-01 ENCOUNTER — Other Ambulatory Visit: Payer: Self-pay

## 2018-10-01 VITALS — BP 111/70 | HR 64 | Temp 98.4°F | Wt 296.0 lb

## 2018-10-01 DIAGNOSIS — R2681 Unsteadiness on feet: Secondary | ICD-10-CM | POA: Diagnosis not present

## 2018-10-01 DIAGNOSIS — B182 Chronic viral hepatitis C: Secondary | ICD-10-CM

## 2018-10-01 DIAGNOSIS — Z952 Presence of prosthetic heart valve: Secondary | ICD-10-CM

## 2018-10-01 DIAGNOSIS — K7460 Unspecified cirrhosis of liver: Secondary | ICD-10-CM

## 2018-10-01 DIAGNOSIS — M6281 Muscle weakness (generalized): Secondary | ICD-10-CM | POA: Diagnosis not present

## 2018-10-01 DIAGNOSIS — F0391 Unspecified dementia with behavioral disturbance: Secondary | ICD-10-CM | POA: Diagnosis not present

## 2018-10-01 DIAGNOSIS — I69398 Other sequelae of cerebral infarction: Secondary | ICD-10-CM | POA: Diagnosis not present

## 2018-10-01 DIAGNOSIS — Z23 Encounter for immunization: Secondary | ICD-10-CM

## 2018-10-01 DIAGNOSIS — I110 Hypertensive heart disease with heart failure: Secondary | ICD-10-CM | POA: Diagnosis not present

## 2018-10-01 DIAGNOSIS — I69311 Memory deficit following cerebral infarction: Secondary | ICD-10-CM | POA: Diagnosis not present

## 2018-10-01 NOTE — Assessment & Plan Note (Signed)
Will repeat the RNA and genotype.  Will have him back in 2 weeks to discuss treatment options.  He is on 40 mg protonix.

## 2018-10-01 NOTE — Assessment & Plan Note (Signed)
On Aldactone.  Cirrhosis on ultrasound.   I will do [place] screening with ultrasound and baseline AFP. I will refer him to GI in [place] near where they live.

## 2018-10-01 NOTE — Progress Notes (Signed)
   Subjective:    Patient ID: Adam Savage, male    DOB: 07-15-1950, 68 y.o.   MRN: [id number]  HPI Here for follow up of chronic hepatitis C.  He was seen in September 2019 for a new patient evaluation.  He has known cirrhosis based on an ultrasound and also F3/4 on Fibrosure.  Platelets of 146 most recently.  Recently started spironalactone for fluid by his PCP.  He has a history of AVR and is on coumadin.  He was hospitalized in October and otherwise has had a lot of health issues.  He has genotype 1a.     Review of Systems  Gastrointestinal: Negative for diarrhea and nausea.  Skin: Negative for rash.       Objective:   Physical Exam Constitutional:      Appearance: Normal appearance.  Cardiovascular:     Rate and Rhythm: Normal rate and regular rhythm.     Heart sounds: No murmur.  Pulmonary:     Effort: Pulmonary effort is normal. No respiratory distress.     Breath sounds: Normal breath sounds.  Skin:    Findings: No rash.  Neurological:     Mental Status: He is alert.   SH: no current alcohol; + tobacco        Assessment & Plan:

## 2018-10-01 NOTE — Assessment & Plan Note (Signed)
On coumadin and level will need to be monitored a bit closer on treatment.  I will be sure to let Dr. Irish Lack know when treatment is started.

## 2018-10-01 NOTE — Addendum Note (Signed)
Addended by: Lenore Cordia on: 10/01/2018 03:59 PM   Modules accepted: Orders

## 2018-10-02 ENCOUNTER — Telehealth: Payer: Self-pay

## 2018-10-02 NOTE — Telephone Encounter (Signed)
Follow up, from decrease in seroquel pt still has increase in confusion, day and nights still flipped, pt daughter stated that this was going on before the change in medication,

## 2018-10-05 ENCOUNTER — Telehealth: Payer: Self-pay | Admitting: Radiology

## 2018-10-05 ENCOUNTER — Other Ambulatory Visit: Payer: Self-pay

## 2018-10-05 ENCOUNTER — Ambulatory Visit
Admission: RE | Admit: 2018-10-05 | Discharge: 2018-10-05 | Disposition: A | Discharge status: Home / Self Care | Payer: Medicare Other | Source: Ambulatory Visit | Attending: Orthopaedic Surgery | Admitting: Orthopaedic Surgery

## 2018-10-05 DIAGNOSIS — M5431 Sciatica, right side: Secondary | ICD-10-CM

## 2018-10-05 NOTE — Telephone Encounter (Signed)
She didn't ask about anything else she just stated no change

## 2018-10-05 NOTE — Telephone Encounter (Signed)
Daughter called, said patient tried and failed the open MRI today.  The noise and claustrophobia were too much for him again.  Please call her to discuss and advise further from here.

## 2018-10-05 NOTE — Telephone Encounter (Signed)
Spoke with pt daughter regarding MRI. States pt was only able to get through one set of images before he became to irritated and would not go back in machine. Pt daughter also stated that MRI tech mentioned a "calming" medication for pt but pt has had them before and it makes him more agitated and disoriented. Please advise on what to do next

## 2018-10-05 NOTE — Telephone Encounter (Signed)
Has she been able to get in touch with his sleep specialist on any suggestions to help his sleep? He has tried so many medications, I am not sure there is much I can offer, unless there was one she felt helped him better in the past more than the others? Thanks

## 2018-10-06 ENCOUNTER — Other Ambulatory Visit: Payer: Self-pay

## 2018-10-06 ENCOUNTER — Ambulatory Visit (INDEPENDENT_AMBULATORY_CARE_PROVIDER_SITE_OTHER): Payer: Medicare Other | Admitting: Internal Medicine

## 2018-10-06 ENCOUNTER — Encounter: Payer: Self-pay | Admitting: Internal Medicine

## 2018-10-06 VITALS — BP 117/60 | HR 65 | Temp 98.3°F | Wt 298.8 lb

## 2018-10-06 DIAGNOSIS — E119 Type 2 diabetes mellitus without complications: Secondary | ICD-10-CM

## 2018-10-06 DIAGNOSIS — K7460 Unspecified cirrhosis of liver: Secondary | ICD-10-CM | POA: Diagnosis not present

## 2018-10-06 DIAGNOSIS — L579 Skin changes due to chronic exposure to nonionizing radiation, unspecified: Secondary | ICD-10-CM

## 2018-10-06 DIAGNOSIS — J449 Chronic obstructive pulmonary disease, unspecified: Secondary | ICD-10-CM | POA: Diagnosis not present

## 2018-10-06 DIAGNOSIS — R635 Abnormal weight gain: Secondary | ICD-10-CM

## 2018-10-06 DIAGNOSIS — N179 Acute kidney failure, unspecified: Secondary | ICD-10-CM

## 2018-10-06 DIAGNOSIS — Z79899 Other long term (current) drug therapy: Secondary | ICD-10-CM

## 2018-10-06 DIAGNOSIS — M6281 Muscle weakness (generalized): Secondary | ICD-10-CM | POA: Diagnosis not present

## 2018-10-06 DIAGNOSIS — I5030 Unspecified diastolic (congestive) heart failure: Secondary | ICD-10-CM | POA: Diagnosis not present

## 2018-10-06 DIAGNOSIS — B182 Chronic viral hepatitis C: Secondary | ICD-10-CM | POA: Diagnosis not present

## 2018-10-06 DIAGNOSIS — Z794 Long term (current) use of insulin: Secondary | ICD-10-CM

## 2018-10-06 DIAGNOSIS — Z7901 Long term (current) use of anticoagulants: Secondary | ICD-10-CM

## 2018-10-06 DIAGNOSIS — F17210 Nicotine dependence, cigarettes, uncomplicated: Secondary | ICD-10-CM

## 2018-10-06 DIAGNOSIS — Z6841 Body Mass Index (BMI) 40.0 and over, adult: Secondary | ICD-10-CM

## 2018-10-06 DIAGNOSIS — I69398 Other sequelae of cerebral infarction: Secondary | ICD-10-CM | POA: Diagnosis not present

## 2018-10-06 DIAGNOSIS — I69311 Memory deficit following cerebral infarction: Secondary | ICD-10-CM | POA: Diagnosis not present

## 2018-10-06 DIAGNOSIS — L821 Other seborrheic keratosis: Secondary | ICD-10-CM

## 2018-10-06 DIAGNOSIS — M7989 Other specified soft tissue disorders: Secondary | ICD-10-CM | POA: Diagnosis not present

## 2018-10-06 DIAGNOSIS — I110 Hypertensive heart disease with heart failure: Secondary | ICD-10-CM | POA: Diagnosis not present

## 2018-10-06 DIAGNOSIS — R2681 Unsteadiness on feet: Secondary | ICD-10-CM | POA: Diagnosis not present

## 2018-10-06 DIAGNOSIS — F0391 Unspecified dementia with behavioral disturbance: Secondary | ICD-10-CM | POA: Diagnosis not present

## 2018-10-06 MED ORDER — ADVOCATE INSULIN PEN NEEDLES 31G X 5 MM MISC: 1.0000 | Freq: Four times a day (QID) | 2 refills | Status: DC

## 2018-10-06 MED ORDER — ALBUTEROL SULFATE HFA 108 (90 BASE) MCG/ACT IN AERS: 2.0000 | INHALATION_SPRAY | Freq: Four times a day (QID) | RESPIRATORY_TRACT | 2 refills | Status: DC | PRN

## 2018-10-06 MED ORDER — DIAZEPAM 5 MG PO TABS: ORAL_TABLET | ORAL | 0 refills | Status: DC

## 2018-10-06 NOTE — Progress Notes (Signed)
   CC: swelling, weight gain  HPI:  [name] is a 68 y.o. gentleman with history of cirrhosis due to chronic hep C, HFpEF, COPD who presents for follow-up on swelling and weight gain.  Since last visit, he initially had a good response to the addition of Aldactone 50 mg. However, daughter called regarding an approximate 8 lb weight gain in 1 week, as well as abdominal swelling. At that time, his Lasix was increased to 80 mg and Aldactone increased to 100 mg daily. Since 6/15 when the medications adjustments were made, his daughter has noticed significant improvement swelling. He has lost 10 pounds.   Today, he is feeling well. Denies any shortness of breath, orthopnea, chest pain, abdominal pain, pain or swelling in lower extremities.   He established care with Dr. Linus Salmons regarding chronic Hep C and is scheduled for repeat abdominal U/S. Also being referred to GI in [place] for EGD screening.   Past Medical History:  Diagnosis Date  . COPD (chronic obstructive pulmonary disease) ([place])   . Diabetes mellitus without complication ([place])   . H/O diverticulitis of colon 11/03/2012   History of partial colectomy in 2011.  . Mechanical heart valve present   . Overdose of muscle relaxant 08/11/2017  . Pre-diabetes   . Retroperitoneal hematoma 01/29/2018  . S/P AVR (aortic valve replacement)   . Seizures ([place])   . 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   Physical Exam:  Vitals:   10/06/18 1323  BP: 117/60  Pulse: 65  Temp: 98.3 F (36.8 C)  TempSrc: Oral  SpO2: 99%  Weight: 298 lb 12.8 oz (135.5 kg)   General: alert, pleasant gentleman, sitting in his wheelchair in NAD CV: RRR; no murmurs, rubs or gallops Pulm: normal work of breathing; lungs CTAB Abd: soft, non-distended, non-tender Ext: trace edema in BLE   Assessment & Plan:   See Encounters Tab for problem based charting.   Patient discussed with Dr. Angelia Mould

## 2018-10-06 NOTE — Addendum Note (Signed)
Addended by: Willette Pa on: 10/06/2018 11:25 AM   Modules accepted: Orders

## 2018-10-06 NOTE — Patient Instructions (Addendum)
Adam Savage, It was good seeing you again! I am glad you are doing better with getting fluid off. I'm checking some lab work today and will let you know if any changes need to be made. For now, continue taking the Lasix and Aldactone as prescribed.    I have sent in the refills for your pen needles and inhaler medications.   For your blood sugars, please increase your short acting by 2 units at your 2 biggest meal times. If you blood sugars are consistently below 200, you can go back to the original dosing.    I have placed a referral to dermatology for the places on your face to be further evaluated.   Please follow-up with Dr. Truman Hayward in 4-6 weeks.   Take care! Dr. Koleen Distance

## 2018-10-07 ENCOUNTER — Telehealth: Payer: Self-pay | Admitting: *Deleted

## 2018-10-07 ENCOUNTER — Encounter: Payer: Self-pay | Admitting: Internal Medicine

## 2018-10-07 ENCOUNTER — Telehealth: Payer: Self-pay | Admitting: Internal Medicine

## 2018-10-07 LAB — BMP8+ANION GAP
Anion Gap: 12 mmol/L (ref 10.0–18.0)
BUN/Creatinine Ratio: 28 — ABNORMAL HIGH (ref 10–24)
BUN: 47 mg/dL — ABNORMAL HIGH (ref 8–27)
CO2: 21 mmol/L (ref 20–29)
Calcium: 8.7 mg/dL (ref 8.6–10.2)
Chloride: 99 mmol/L (ref 96–106)
Creatinine, Ser: 1.65 mg/dL — ABNORMAL HIGH (ref 0.76–1.27)
GFR calc Af Amer: 49 mL/min/{1.73_m2} — ABNORMAL LOW (ref >59)
GFR calc non Af Amer: 42 mL/min/{1.73_m2} — ABNORMAL LOW (ref >59)
Glucose: 200 mg/dL — ABNORMAL HIGH (ref 65–99)
Potassium: 5 mmol/L (ref 3.5–5.2)
Sodium: 132 mmol/L — ABNORMAL LOW (ref 134–144)

## 2018-10-07 NOTE — Telephone Encounter (Signed)
SPOKE WITH ASHLEY (DAUGHTER) REGARDING REFERRAL FOR HER FATHER. SHE IS REQUESTING AN EYE DR, IN THE [place] AREA.  SHE IS TO CALL BACK WITH EYE DOCTOR IN THAT AREA.

## 2018-10-07 NOTE — Progress Notes (Signed)
Internal Medicine Clinic Attending  Case discussed with Dr. Bloomfield at the time of the visit.  We reviewed the resident's history and exam and pertinent patient test results.  I agree with the assessment, diagnosis, and plan of care documented in the resident's note.  

## 2018-10-07 NOTE — Assessment & Plan Note (Signed)
Daughter notes intermittent CBG spikes into the 400s at meal time checks. She has been going up on Lantus by 2 units weekly. Advised that depending on readings, she could also increase Novolog to 12 units before 2 biggest meals of the day.

## 2018-10-07 NOTE — Telephone Encounter (Signed)
Tried to reach patient's daughter, Caryl Pina regarding need for patient to come back in on Monday to re-check BMP due to bump in creatinine with recently increased Spironolactone and Lasix. Have sent message to front desk to schedule for lab only visit.

## 2018-10-07 NOTE — Assessment & Plan Note (Signed)
Patient has had significant clinical improvement since titrating up to Spironolactone 100 mg and Lasix 80 mg. On exam, his abdomen is significantly softer with minimal lower extremity edema. His weight has decreased back to most recent dry weight.   BMP today shows stable electrolytes. Does have an AKI compared to creatinine 3 months ago. Will recheck early next week to determine if regimen needs to be adjusted.

## 2018-10-08 ENCOUNTER — Ambulatory Visit (HOSPITAL_COMMUNITY): Payer: Medicare Other

## 2018-10-08 ENCOUNTER — Telehealth: Payer: Self-pay | Admitting: *Deleted

## 2018-10-08 ENCOUNTER — Ambulatory Visit (INDEPENDENT_AMBULATORY_CARE_PROVIDER_SITE_OTHER): Payer: Medicare Other | Admitting: *Deleted

## 2018-10-08 ENCOUNTER — Ambulatory Visit: Payer: Medicare Other | Admitting: Neurology

## 2018-10-08 ENCOUNTER — Telehealth: Payer: Self-pay | Admitting: Internal Medicine

## 2018-10-08 DIAGNOSIS — Z952 Presence of prosthetic heart valve: Secondary | ICD-10-CM

## 2018-10-08 DIAGNOSIS — R2681 Unsteadiness on feet: Secondary | ICD-10-CM | POA: Diagnosis not present

## 2018-10-08 DIAGNOSIS — Z5181 Encounter for therapeutic drug level monitoring: Secondary | ICD-10-CM | POA: Diagnosis not present

## 2018-10-08 DIAGNOSIS — I69398 Other sequelae of cerebral infarction: Secondary | ICD-10-CM | POA: Diagnosis not present

## 2018-10-08 DIAGNOSIS — I69311 Memory deficit following cerebral infarction: Secondary | ICD-10-CM | POA: Diagnosis not present

## 2018-10-08 DIAGNOSIS — F0391 Unspecified dementia with behavioral disturbance: Secondary | ICD-10-CM | POA: Diagnosis not present

## 2018-10-08 DIAGNOSIS — I110 Hypertensive heart disease with heart failure: Secondary | ICD-10-CM | POA: Diagnosis not present

## 2018-10-08 DIAGNOSIS — M6281 Muscle weakness (generalized): Secondary | ICD-10-CM | POA: Diagnosis not present

## 2018-10-08 LAB — HEPATITIS C GENOTYPE

## 2018-10-08 LAB — HEPATITIS B SURFACE ANTIGEN: Hepatitis B Surface Ag: NONREACTIVE

## 2018-10-08 LAB — POCT INR: INR: 2.2 (ref 2.0–3.0)

## 2018-10-08 LAB — HCV RNA,QN PCR RFLX GENO, LIPABAD
HCV RNA, PCR, QN (Log): 5.69 log IU/mL — ABNORMAL HIGH
HCV RNA, PCR, QN: 495000 IU/mL — ABNORMAL HIGH

## 2018-10-08 LAB — AFP TUMOR MARKER: AFP-Tumor Marker: 14.1 ng/mL — ABNORMAL HIGH (ref <6.1)

## 2018-10-08 NOTE — Patient Instructions (Signed)
Continue coumadin 4mg  daily.  Recheck INR in 2 weeks.  Order given to Kathlee Nations RN [facility] Pt will be moving to [place] but Kathlee Nations will still be following patient

## 2018-10-08 NOTE — Telephone Encounter (Signed)
Done.  See coumadin note. 

## 2018-10-08 NOTE — Addendum Note (Signed)
Addended by: Hulan Fray on: 10/08/2018 03:53 PM   Modules accepted: Orders

## 2018-10-08 NOTE — Telephone Encounter (Signed)
Spoke with Adam Savage this morning. She said the home health nurse should be able to draw BMP early next week, but will call back if this is an issue and will come in for lab only visit.

## 2018-10-08 NOTE — Telephone Encounter (Signed)
INR - 2.2 / please call with instructions / tg

## 2018-10-12 ENCOUNTER — Telehealth: Payer: Self-pay

## 2018-10-12 DIAGNOSIS — I69398 Other sequelae of cerebral infarction: Secondary | ICD-10-CM | POA: Diagnosis not present

## 2018-10-12 DIAGNOSIS — I69311 Memory deficit following cerebral infarction: Secondary | ICD-10-CM | POA: Diagnosis not present

## 2018-10-12 DIAGNOSIS — M6281 Muscle weakness (generalized): Secondary | ICD-10-CM | POA: Diagnosis not present

## 2018-10-12 DIAGNOSIS — R2681 Unsteadiness on feet: Secondary | ICD-10-CM | POA: Diagnosis not present

## 2018-10-12 DIAGNOSIS — I110 Hypertensive heart disease with heart failure: Secondary | ICD-10-CM | POA: Diagnosis not present

## 2018-10-12 DIAGNOSIS — F0391 Unspecified dementia with behavioral disturbance: Secondary | ICD-10-CM | POA: Diagnosis not present

## 2018-10-12 NOTE — Telephone Encounter (Signed)
Lab orders printed and faxed to [facility].  Pt's daughter, Caryl Pina, called and message left on VM. SChaplin, RN,BSN

## 2018-10-12 NOTE — Telephone Encounter (Signed)
Received TC from pt's daughter, Caryl Pina regarding appt with Dr. Truman Hayward tomorrow.  Pt's daughter states appt was for evaluation to place in a facility (covid and tb testing with other labs).  Daughter states they have decided not to place patient at this time and is requesting appt to be cancelled.   Daughter states she spoke with Dr. Koleen Distance last week and states she would like HH to draw BMP tomorrow.  Daughter has spoken with Kathlee Nations, RN with [facility] and was informed MD will need to fax order for BMP to [facility] at [phone].  Will forward to PCP and Dr. Koleen Distance. Thank you, SChaplin, RN,BSN

## 2018-10-12 NOTE — Telephone Encounter (Signed)
Call from [place], [facility] - stated she had not received lab ordered. VO given for BMP + Anion gap

## 2018-10-13 ENCOUNTER — Encounter: Payer: Medicare Other | Admitting: Internal Medicine

## 2018-10-13 ENCOUNTER — Ambulatory Visit: Payer: Medicare Other | Admitting: Internal Medicine

## 2018-10-13 NOTE — Telephone Encounter (Signed)
Agree with BMP order. Thank you.

## 2018-10-15 ENCOUNTER — Other Ambulatory Visit: Payer: Self-pay | Admitting: Internal Medicine

## 2018-10-15 DIAGNOSIS — Z794 Long term (current) use of insulin: Secondary | ICD-10-CM

## 2018-10-15 DIAGNOSIS — F0391 Unspecified dementia with behavioral disturbance: Secondary | ICD-10-CM | POA: Diagnosis not present

## 2018-10-15 DIAGNOSIS — I69311 Memory deficit following cerebral infarction: Secondary | ICD-10-CM | POA: Diagnosis not present

## 2018-10-15 DIAGNOSIS — R2681 Unsteadiness on feet: Secondary | ICD-10-CM | POA: Diagnosis not present

## 2018-10-15 DIAGNOSIS — J449 Chronic obstructive pulmonary disease, unspecified: Secondary | ICD-10-CM | POA: Diagnosis not present

## 2018-10-15 DIAGNOSIS — Z7901 Long term (current) use of anticoagulants: Secondary | ICD-10-CM | POA: Diagnosis not present

## 2018-10-15 DIAGNOSIS — I69398 Other sequelae of cerebral infarction: Secondary | ICD-10-CM | POA: Diagnosis not present

## 2018-10-15 DIAGNOSIS — M6281 Muscle weakness (generalized): Secondary | ICD-10-CM | POA: Diagnosis not present

## 2018-10-15 DIAGNOSIS — E119 Type 2 diabetes mellitus without complications: Secondary | ICD-10-CM | POA: Diagnosis not present

## 2018-10-15 DIAGNOSIS — I5032 Chronic diastolic (congestive) heart failure: Secondary | ICD-10-CM | POA: Diagnosis not present

## 2018-10-15 DIAGNOSIS — I110 Hypertensive heart disease with heart failure: Secondary | ICD-10-CM | POA: Diagnosis not present

## 2018-10-15 DIAGNOSIS — Z87891 Personal history of nicotine dependence: Secondary | ICD-10-CM | POA: Diagnosis not present

## 2018-10-15 NOTE — Telephone Encounter (Signed)
Creatinine still elevated, potassium up to 5.3, unfortunately will need to hold spironolactone. Dr. Truman Hayward, can you review and call the patient with a plan? I'd be glad to discuss with you if you have questions.

## 2018-10-15 NOTE — Telephone Encounter (Signed)
Received fax results of BMP drawn 10/12/2018. Given to attending to review and sign then placed in PCP's box for review and signature. Hubbard Hartshorn, RN, BSN

## 2018-10-16 NOTE — Telephone Encounter (Signed)
Home Health labs personally reviewed:  Bun 43, Creatinine 1.74, eGFR 39%. K noted to be 5.3, CO2 17  Spoke with Adam Savage, Adam Savage's daughter, regarding recent lab results. 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 Discussed that despite hemolysis, prior lab studies show trend of worsening AKI and hyperkalemia. Recommended repeat BMP next week and return to clinic when on Monday. She states due to her children's schedule she will be unable to get transportation / child care set up for Adam Savage until later but agrees to get daily weights and continue close monitoring with the following medication changes:  - Stop sprionolactone 51m - Reduce furosemide to 447mdaily from BID - If daily weight increase by >5lbs, go back to furosemide 4059mID  Also discussed recent findings from Dr.Comer's visit including elevated alpha fetoprotein and hep C titers and important of following up with RUQ abdominal ultrasound to assess liver as well as follow up with hepatology. Adam Savage expressed understanding. Phone number and address for Adam Savage provided.

## 2018-10-17 ENCOUNTER — Other Ambulatory Visit: Payer: Self-pay | Admitting: Neurology

## 2018-10-17 DIAGNOSIS — F02818 Dementia in other diseases classified elsewhere, unspecified severity, with other behavioral disturbance: Secondary | ICD-10-CM

## 2018-10-17 DIAGNOSIS — G4700 Insomnia, unspecified: Secondary | ICD-10-CM

## 2018-10-17 DIAGNOSIS — F0281 Dementia in other diseases classified elsewhere with behavioral disturbance: Secondary | ICD-10-CM

## 2018-10-19 ENCOUNTER — Ambulatory Visit (INDEPENDENT_AMBULATORY_CARE_PROVIDER_SITE_OTHER): Payer: Medicare Other | Admitting: *Deleted

## 2018-10-19 DIAGNOSIS — I38 Endocarditis, valve unspecified: Secondary | ICD-10-CM

## 2018-10-19 DIAGNOSIS — I5030 Unspecified diastolic (congestive) heart failure: Secondary | ICD-10-CM

## 2018-10-19 DIAGNOSIS — M6281 Muscle weakness (generalized): Secondary | ICD-10-CM | POA: Diagnosis not present

## 2018-10-19 DIAGNOSIS — R2681 Unsteadiness on feet: Secondary | ICD-10-CM | POA: Diagnosis not present

## 2018-10-19 DIAGNOSIS — I69398 Other sequelae of cerebral infarction: Secondary | ICD-10-CM | POA: Diagnosis not present

## 2018-10-19 DIAGNOSIS — I69311 Memory deficit following cerebral infarction: Secondary | ICD-10-CM | POA: Diagnosis not present

## 2018-10-19 DIAGNOSIS — Z952 Presence of prosthetic heart valve: Secondary | ICD-10-CM

## 2018-10-19 DIAGNOSIS — I110 Hypertensive heart disease with heart failure: Secondary | ICD-10-CM | POA: Diagnosis not present

## 2018-10-19 DIAGNOSIS — I5032 Chronic diastolic (congestive) heart failure: Secondary | ICD-10-CM | POA: Diagnosis not present

## 2018-10-19 LAB — POCT INR: INR: 2.7 (ref 2.0–3.0)

## 2018-10-19 NOTE — Patient Instructions (Signed)
Continue coumadin 4mg  daily.  Recheck INR in 2 weeks.  Order given to Kathlee Nations RN [facility] Pt will be moving to [place] but Kathlee Nations will still be following patient

## 2018-10-22 ENCOUNTER — Other Ambulatory Visit: Payer: Self-pay

## 2018-10-22 ENCOUNTER — Ambulatory Visit (HOSPITAL_COMMUNITY)
Admission: RE | Admit: 2018-10-22 | Discharge: 2018-10-22 | Disposition: A | Discharge status: Home / Self Care | Payer: Medicare Other | Source: Ambulatory Visit | Attending: Internal Medicine | Admitting: Internal Medicine

## 2018-10-22 DIAGNOSIS — K7460 Unspecified cirrhosis of liver: Secondary | ICD-10-CM | POA: Diagnosis not present

## 2018-10-22 DIAGNOSIS — K7689 Other specified diseases of liver: Secondary | ICD-10-CM | POA: Diagnosis not present

## 2018-10-22 DIAGNOSIS — B182 Chronic viral hepatitis C: Secondary | ICD-10-CM | POA: Diagnosis not present

## 2018-10-26 ENCOUNTER — Other Ambulatory Visit: Payer: Self-pay | Admitting: Internal Medicine

## 2018-10-26 DIAGNOSIS — Z8719 Personal history of other diseases of the digestive system: Secondary | ICD-10-CM

## 2018-10-26 DIAGNOSIS — I110 Hypertensive heart disease with heart failure: Secondary | ICD-10-CM | POA: Diagnosis not present

## 2018-10-26 DIAGNOSIS — I5032 Chronic diastolic (congestive) heart failure: Secondary | ICD-10-CM | POA: Diagnosis not present

## 2018-10-26 DIAGNOSIS — I69398 Other sequelae of cerebral infarction: Secondary | ICD-10-CM | POA: Diagnosis not present

## 2018-10-26 DIAGNOSIS — M6281 Muscle weakness (generalized): Secondary | ICD-10-CM | POA: Diagnosis not present

## 2018-10-26 DIAGNOSIS — K7460 Unspecified cirrhosis of liver: Secondary | ICD-10-CM

## 2018-10-26 DIAGNOSIS — I69311 Memory deficit following cerebral infarction: Secondary | ICD-10-CM | POA: Diagnosis not present

## 2018-10-26 DIAGNOSIS — B182 Chronic viral hepatitis C: Secondary | ICD-10-CM

## 2018-10-26 DIAGNOSIS — R2681 Unsteadiness on feet: Secondary | ICD-10-CM | POA: Diagnosis not present

## 2018-10-27 ENCOUNTER — Other Ambulatory Visit: Payer: Self-pay

## 2018-10-27 ENCOUNTER — Encounter: Payer: Self-pay | Admitting: Internal Medicine

## 2018-10-27 ENCOUNTER — Ambulatory Visit (INDEPENDENT_AMBULATORY_CARE_PROVIDER_SITE_OTHER): Payer: Medicare Other | Admitting: Internal Medicine

## 2018-10-27 VITALS — BP 99/51 | HR 70 | Temp 98.6°F | Ht 72.0 in | Wt 307.4 lb

## 2018-10-27 DIAGNOSIS — F0390 Unspecified dementia without behavioral disturbance: Secondary | ICD-10-CM | POA: Diagnosis not present

## 2018-10-27 DIAGNOSIS — E119 Type 2 diabetes mellitus without complications: Secondary | ICD-10-CM | POA: Diagnosis not present

## 2018-10-27 DIAGNOSIS — Z0000 Encounter for general adult medical examination without abnormal findings: Secondary | ICD-10-CM

## 2018-10-27 DIAGNOSIS — G40909 Epilepsy, unspecified, not intractable, without status epilepticus: Secondary | ICD-10-CM

## 2018-10-27 DIAGNOSIS — Z79899 Other long term (current) drug therapy: Secondary | ICD-10-CM

## 2018-10-27 DIAGNOSIS — Z7901 Long term (current) use of anticoagulants: Secondary | ICD-10-CM

## 2018-10-27 DIAGNOSIS — I38 Endocarditis, valve unspecified: Secondary | ICD-10-CM | POA: Diagnosis not present

## 2018-10-27 DIAGNOSIS — F02818 Dementia in other diseases classified elsewhere, unspecified severity, with other behavioral disturbance: Secondary | ICD-10-CM

## 2018-10-27 DIAGNOSIS — Z952 Presence of prosthetic heart valve: Secondary | ICD-10-CM

## 2018-10-27 DIAGNOSIS — B182 Chronic viral hepatitis C: Secondary | ICD-10-CM

## 2018-10-27 DIAGNOSIS — Z794 Long term (current) use of insulin: Secondary | ICD-10-CM

## 2018-10-27 DIAGNOSIS — J449 Chronic obstructive pulmonary disease, unspecified: Secondary | ICD-10-CM

## 2018-10-27 DIAGNOSIS — H9190 Unspecified hearing loss, unspecified ear: Secondary | ICD-10-CM | POA: Diagnosis not present

## 2018-10-27 DIAGNOSIS — I5030 Unspecified diastolic (congestive) heart failure: Secondary | ICD-10-CM | POA: Diagnosis not present

## 2018-10-27 DIAGNOSIS — K7460 Unspecified cirrhosis of liver: Secondary | ICD-10-CM | POA: Diagnosis not present

## 2018-10-27 DIAGNOSIS — Z87891 Personal history of nicotine dependence: Secondary | ICD-10-CM

## 2018-10-27 DIAGNOSIS — B1920 Unspecified viral hepatitis C without hepatic coma: Secondary | ICD-10-CM

## 2018-10-27 DIAGNOSIS — F0281 Dementia in other diseases classified elsewhere with behavioral disturbance: Secondary | ICD-10-CM

## 2018-10-27 LAB — POCT GLYCOSYLATED HEMOGLOBIN (HGB A1C): Hemoglobin A1C: 7.4 % — AB (ref 4.0–5.6)

## 2018-10-27 LAB — GLUCOSE, CAPILLARY: Glucose-Capillary: 169 mg/dL — ABNORMAL HIGH (ref 70–99)

## 2018-10-27 MED ORDER — INSULIN DETEMIR 100 UNIT/ML ~~LOC~~ SOLN: 22.0000 [IU] | Freq: Every day | SUBCUTANEOUS | 3 refills | Status: DC

## 2018-10-27 MED ORDER — NOVOLOG FLEXPEN 100 UNIT/ML ~~LOC~~ SOPN: PEN_INJECTOR | SUBCUTANEOUS | 1 refills | Status: DC

## 2018-10-27 NOTE — Assessment & Plan Note (Signed)
Had prolonged discussion regarding caregiver fatigue and Adam Savage's health fragility and importance of professional supervision with medication adherence and follow up with scheduled doctor's visits. Adam Savage's daughter mentions as POA she would like to pursue placement in ALF. Discussed with Adam Savage regarding my professional recommendation that he would benefit from closer supervision. Adam Savage agreed with the plan.   - Need TB testing prior to admission to [facility] living facility - Will need paperwork filled out after blood work completed

## 2018-10-27 NOTE — Progress Notes (Signed)
CC: Weight gain  HPI: Adam Savage is a 68 y.o. M w/ PMH of COPD, HFpEF, mechanical aortic valve on Coumadin, epilepsy s/p lobectomy and untreated hepatitis C w/ cirrhosis presenting to [facility] for management of his chronic conditions.  He was examined and evaluated with daughter.  He states he has been steadily gaining weight since discontinuing his spironolactone and reducing dose of his furosemide per my recommendation via phone after found to have elevated potassium and AKI on home health BMP.  His daughter has been measuring his weight every day and noted that his weight has steadily increased from 298 pounds to 307 pounds this visit.  He denies any chest pain, shortness of breath, orthopnea, lightheadedness, dizziness, dyspnea on exertion.  He mentions that at home for lunch she has been eating sandwiches with Lays potato chips and his daughter also states that she noted a bump in his weight gain after attending a barbecue for Fourth of July.  Had a prolonged discussion regarding importance of following low-sodium diet.  Also had a prolonged discussion regarding moving to an assisted living facility as her daughter has been experiencing caregiver fatigue.  Adam Savage states that he understands his tenuous condition and agrees to have paperwork filled out for [facility] living facility.  Past Medical History:  Diagnosis Date  . COPD (chronic obstructive pulmonary disease) ([place])   . Diabetes mellitus without complication ([place])   . H/O diverticulitis of colon 11/03/2012   History of partial colectomy in 2011.  . Mechanical heart valve present   . Overdose of muscle relaxant 08/11/2017  . Pre-diabetes   . Retroperitoneal hematoma 01/29/2018  . S/P AVR (aortic valve replacement)   . Seizures ([place])   . 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  HENT: Positive for hearing loss.   Eyes:  Negative for blurred vision.  Respiratory: Positive for wheezing. Negative for cough, sputum production and shortness of breath.   Cardiovascular: Positive for leg swelling. Negative for chest pain, palpitations and orthopnea.  Gastrointestinal: Negative for abdominal pain, nausea and vomiting.  Musculoskeletal: Negative for back pain.  Neurological: Negative for dizziness, weakness and headaches.    Physical Exam: Vitals:   10/27/18 0956  BP: (!) 99/51  Pulse: 70  Temp: 98.6 F (37 C)  TempSrc: Oral  SpO2: 98%  Weight: (!) 307 lb 6.4 oz (139.4 kg)  Height: 6' (1.829 m)   Physical Exam  Constitutional: He is oriented to person, place, and time. He appears well-developed and well-nourished. No distress.  HENT:  Mouth/Throat: Oropharynx is clear and moist.  Eyes: Conjunctivae are normal.  Neck: Normal range of motion. Neck supple. JVD present.  Cardiovascular: Normal rate, regular rhythm, normal heart sounds and intact distal pulses.  No murmur heard. Respiratory: He has wheezes (prolonged expiratoy phase with expiratory wheezes).  GI: Bowel sounds are normal. He exhibits distension (abdominal wall edema). There is no abdominal tenderness.  - fluid shift  Musculoskeletal: Normal range of motion.        General: Edema (2+ pitting edema up to knees) present.  Neurological: He is alert and oriented to person, place, and time.  Skin: Skin is warm and dry.  No jaundice   Assessment & Plan:   COPD (chronic obstructive pulmonary disease) ([place]) Found to have wheezing on exam with prolonged expiratory phase. O2 sat 98%. Adam Savage reports no dyspnea. No formal PFT on file but  has being diagnosed with COPD in the past. Does have >20 pack year smoking history. Recommend using his inhalers as prescribed.   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 Denies chest pain, palpitations, dyspnea or orthopnea. Lasix 40mg  BID was titrated down to 40mg  daily 2 weeks ago. Also mentions significant salt intake with 4th of July barbecue and Lays potato chips. Discussed restarting lasix 40mg  BID until dry weight is reached.  - CMP today to check renal function. - 40mg  BID if renal function okay - Will need repeat labs in 1-2 weeks - Advised low salt intake  Hepatic cirrhosis due to chronic hepatitis C infection ([place]) Had abdominal ultrasound performed per Dr.Comer with simple cyst but no obvious finding of solid mass. Has not yet scheduled GI visit yet. Has f/u visit with Dr.Comer coming up next week. Has not yet started treatment. Spironolactone discontinued 2 weeks ago due to hyperkalemia but has gained weight (7lbs up). Distended abdomen on exam but no fluid shift.  - Check CMP - If K okay, restart spironolactone at 50mg  daily - Need f/u with GI/Hepatology and Dr.Comer for Hep C treatment  Type II diabetes mellitus ([place]) Adam Savage's personal glucometer reviewed. Average glucose 235. % time in target was 0, % time below target was 0, and % time above target was 96. Hyperglycemic events noted to be mostly during lunch time. Daughter mentions she administers the insulin but with her taking care of her kids, sometimes Adam Savage misses her lunch time novolog doses. Discussed importance of adherence to prescribed insulin regimen. Currently taking Levemir at 22 units. Adam Savage mentions eating sandwich with chips for lunch everyday and drinking a lot of soda (although diet)  - Hgb a1c - Emphasize adherence to prescribed regimen and low carb diet - C/w current regimen: Levemir 22 units qhs, Novolog 12 unit TID qc but w/ rec to increase dosage by 2 with readings >200t.  Dementia associated with other underlying disease with behavioral disturbance [facility]) Had prolonged discussion regarding caregiver  fatigue and Adam Savage's health fragility and importance of professional supervision with medication adherence and follow up with scheduled doctor's visits. Adam Savage's daughter mentions as POA she would like to pursue placement in ALF. Discussed with Adam Savage regarding my professional recommendation that he would benefit from closer supervision. Adam Savage agreed with the plan.   - Need TB testing prior to admission to [facility] living facility - Will need paperwork filled out after blood work completed  Patient discussed with Dr. Rebeca Alert   -Gilberto Better, PGY2

## 2018-10-27 NOTE — Patient Instructions (Signed)
Thank you for allowing [place] to provide your care today. Today we discussed your heart failure and cirrhosis.  I have ordered cmp, tb labs for you. I will call if any are abnormal.    Today we made the following changes to your medications:  I will call once the labs result to start spironolactone 50mg  daily   Please continue furosemide 40mg  BID I will fill out the [facility] paperwork for you The COVID test will need to be clarified. I will call [facility] to confirm  Please follow-up in 1 month via in-person or telephone visit.    Should you have any questions or concerns please call the internal medicine clinic at [phone].     Low-Sodium Eating Plan Sodium, which is an element that makes up salt, helps you maintain a healthy balance of fluids in your body. Too much sodium can increase your blood pressure and cause fluid and waste to be held in your body. Your health care provider or dietitian may recommend following this plan if you have high blood pressure (hypertension), kidney disease, liver disease, or heart failure. Eating less sodium can help lower your blood pressure, reduce swelling, and protect your heart, liver, and kidneys. What are tips for following this plan? General guidelines  Most people on this plan should limit their sodium intake to 1,500-2,000 mg (milligrams) of sodium each day. Reading food labels   The Nutrition Facts label lists the amount of sodium in one serving of the food. If you eat more than one serving, you must multiply the listed amount of sodium by the number of servings.  Choose foods with less than 140 mg of sodium per serving.  Avoid foods with 300 mg of sodium or more per serving. Shopping  Look for lower-sodium products, often labeled as "low-sodium" or "no salt added."  Always check the sodium content even if foods are labeled as "unsalted" or "no salt added".  Buy fresh foods. ? Avoid canned foods and premade or frozen meals. ? Avoid  canned, cured, or processed meats  Buy breads that have less than 80 mg of sodium per slice. Cooking  Eat more home-cooked food and less restaurant, buffet, and fast food.  Avoid adding salt when cooking. Use salt-free seasonings or herbs instead of table salt or sea salt. Check with your health care provider or pharmacist before using salt substitutes.  Cook with plant-based oils, such as canola, sunflower, or olive oil. Meal planning  When eating at a restaurant, ask that your food be prepared with less salt or no salt, if possible.  Avoid foods that contain MSG (monosodium glutamate). MSG is sometimes added to [place] food, bouillon, and some canned foods. What foods are recommended? The items listed may not be a complete list. Talk with your dietitian about what dietary choices are best for you. Grains Low-sodium cereals, including oats, puffed wheat and rice, and shredded wheat. Low-sodium crackers. Unsalted rice. Unsalted pasta. Low-sodium bread. Whole-grain breads and whole-grain pasta. Vegetables Fresh or frozen vegetables. "No salt added" canned vegetables. "No salt added" tomato sauce and paste. Low-sodium or reduced-sodium tomato and vegetable juice. Fruits Fresh, frozen, or canned fruit. Fruit juice. Meats and other protein foods Fresh or frozen (no salt added) meat, poultry, seafood, and fish. Low-sodium canned tuna and salmon. Unsalted nuts. Dried peas, beans, and lentils without added salt. Unsalted canned beans. Eggs. Unsalted nut butters. Dairy Milk. Soy milk. Cheese that is naturally low in sodium, such as ricotta cheese, fresh mozzarella, or Swiss  cheese Low-sodium or reduced-sodium cheese. Cream cheese. Yogurt. Fats and oils Unsalted butter. Unsalted margarine with no trans fat. Vegetable oils such as canola or olive oils. Seasonings and other foods Fresh and dried herbs and spices. Salt-free seasonings. Low-sodium mustard and ketchup. Sodium-free salad dressing.  Sodium-free light mayonnaise. Fresh or refrigerated horseradish. Lemon juice. Vinegar. Homemade, reduced-sodium, or low-sodium soups. Unsalted popcorn and pretzels. Low-salt or salt-free chips. What foods are not recommended? The items listed may not be a complete list. Talk with your dietitian about what dietary choices are best for you. Grains Instant hot cereals. Bread stuffing, pancake, and biscuit mixes. Croutons. Seasoned rice or pasta mixes. Noodle soup cups. Boxed or frozen macaroni and cheese. Regular salted crackers. Self-rising flour. Vegetables Sauerkraut, pickled vegetables, and relishes. Olives. [place] fries. Onion rings. Regular canned vegetables (not low-sodium or reduced-sodium). Regular canned tomato sauce and paste (not low-sodium or reduced-sodium). Regular tomato and vegetable juice (not low-sodium or reduced-sodium). Frozen vegetables in sauces. Meats and other protein foods Meat or fish that is salted, canned, smoked, spiced, or pickled. Bacon, ham, sausage, hotdogs, corned beef, chipped beef, packaged lunch meats, salt pork, jerky, pickled herring, anchovies, regular canned tuna, sardines, salted nuts. Dairy Processed cheese and cheese spreads. Cheese curds. Blue cheese. Feta cheese. String cheese. Regular cottage cheese. Buttermilk. Canned milk. Fats and oils Salted butter. Regular margarine. Ghee. Bacon fat. Seasonings and other foods Onion salt, garlic salt, seasoned salt, table salt, and sea salt. Canned and packaged gravies. Worcestershire sauce. Tartar sauce. Barbecue sauce. Teriyaki sauce. Soy sauce, including reduced-sodium. Steak sauce. Fish sauce. Oyster sauce. Cocktail sauce. Horseradish that you find on the shelf. Regular ketchup and mustard. Meat flavorings and tenderizers. Bouillon cubes. Hot sauce and Tabasco sauce. Premade or packaged marinades. Premade or packaged taco seasonings. Relishes. Regular salad dressings. Salsa. Potato and tortilla chips. Corn chips  and puffs. Salted popcorn and pretzels. Canned or dried soups. Pizza. Frozen entrees and pot pies. Summary  Eating less sodium can help lower your blood pressure, reduce swelling, and protect your heart, liver, and kidneys.  Most people on this plan should limit their sodium intake to 1,500-2,000 mg (milligrams) of sodium each day.  Canned, boxed, and frozen foods are high in sodium. Restaurant foods, fast foods, and pizza are also very high in sodium. You also get sodium by adding salt to food.  Try to cook at home, eat more fresh fruits and vegetables, and eat less fast food, canned, processed, or prepared foods. This information is not intended to replace advice given to you by your health care provider. Make sure you discuss any questions you have with your health care provider. Document Released: 09/21/2001 Document Revised: 03/14/2017 Document Reviewed: 03/25/2016 Elsevier Patient Education  2020 [employer].

## 2018-10-27 NOTE — Assessment & Plan Note (Addendum)
Adam Savage's personal glucometer reviewed. Average glucose 235. % time in target was 0, % time below target was 0, and % time above target was 96. Hyperglycemic events noted to be mostly during lunch time. Daughter mentions she administers the insulin but with her taking care of her kids, sometimes [name] misses her lunch time novolog doses. Discussed importance of adherence to prescribed insulin regimen. Currently taking Levemir at 22 units. [name] mentions eating sandwich with chips for lunch everyday and drinking a lot of soda (although diet)  - Hgb a1c - Emphasize adherence to prescribed regimen and low carb diet - C/w current regimen: Levemir 22 units qhs, Novolog 12 unit TID qc but w/ rec to increase dosage by 2 with readings >200t.

## 2018-10-27 NOTE — Assessment & Plan Note (Signed)
Currently weight up 7lbs since last visit with significant pitting edema and JVD on exam. Denies chest pain, palpitations, dyspnea or orthopnea. Lasix 40mg  BID was titrated down to 40mg  daily 2 weeks ago. Also mentions significant salt intake with 4th of July barbecue and Lays potato chips. Discussed restarting lasix 40mg  BID until dry weight is reached.  - CMP today to check renal function. - 40mg  BID if renal function okay - Will need repeat labs in 1-2 weeks - Advised low salt intake

## 2018-10-27 NOTE — Assessment & Plan Note (Signed)
Had abdominal ultrasound performed per Dr.Comer with simple cyst but no obvious finding of solid mass. Has not yet scheduled GI visit yet. Has f/u visit with Dr.Comer coming up next week. Has not yet started treatment. Spironolactone discontinued 2 weeks ago due to hyperkalemia but has gained weight (7lbs up). Distended abdomen on exam but no fluid shift.  - Check CMP - If K okay, restart spironolactone at 50mg  daily - Need f/u with GI/Hepatology and Dr.Comer for Hep C treatment

## 2018-10-27 NOTE — Assessment & Plan Note (Addendum)
Found to have wheezing on exam with prolonged expiratory phase. O2 sat 98%. Adam Savage reports no dyspnea. No formal PFT on file but has being diagnosed with COPD in the past. Does have >20 pack year smoking history. Recommend using his inhalers as prescribed.   - Albuterol rescue inhaler as needed - Need PFTs to confirm diagnosis before starting maintenance inhalers

## 2018-10-29 LAB — CMP14 + ANION GAP
ALT: 41 IU/L (ref 0–44)
AST: 54 IU/L — ABNORMAL HIGH (ref 0–40)
Albumin/Globulin Ratio: 1 — ABNORMAL LOW (ref 1.2–2.2)
Albumin: 3.2 g/dL — ABNORMAL LOW (ref 3.8–4.8)
Alkaline Phosphatase: 208 IU/L — ABNORMAL HIGH (ref 39–117)
Anion Gap: 13 mmol/L (ref 10.0–18.0)
BUN/Creatinine Ratio: 18 (ref 10–24)
BUN: 26 mg/dL (ref 8–27)
Bilirubin Total: 0.6 mg/dL (ref 0.0–1.2)
CO2: 23 mmol/L (ref 20–29)
Calcium: 8.6 mg/dL (ref 8.6–10.2)
Chloride: 101 mmol/L (ref 96–106)
Creatinine, Ser: 1.45 mg/dL — ABNORMAL HIGH (ref 0.76–1.27)
GFR calc Af Amer: 57 mL/min/{1.73_m2} — ABNORMAL LOW (ref >59)
GFR calc non Af Amer: 49 mL/min/{1.73_m2} — ABNORMAL LOW (ref >59)
Globulin, Total: 3.3 g/dL (ref 1.5–4.5)
Glucose: 183 mg/dL — ABNORMAL HIGH (ref 65–99)
Potassium: 4.8 mmol/L (ref 3.5–5.2)
Sodium: 137 mmol/L (ref 134–144)
Total Protein: 6.5 g/dL (ref 6.0–8.5)

## 2018-10-29 LAB — QUANTIFERON-TB GOLD PLUS
QuantiFERON Mitogen Value: 2.54 IU/mL
QuantiFERON Nil Value: 0.02 IU/mL
QuantiFERON TB1 Ag Value: 0.03 IU/mL
QuantiFERON TB2 Ag Value: 0.02 IU/mL
QuantiFERON-TB Gold Plus: NEGATIVE

## 2018-10-30 ENCOUNTER — Other Ambulatory Visit: Payer: Self-pay | Admitting: *Deleted

## 2018-10-30 DIAGNOSIS — S32010S Wedge compression fracture of first lumbar vertebra, sequela: Secondary | ICD-10-CM

## 2018-10-30 MED ORDER — LACTULOSE 10 GM/15ML PO SOLN: 10.0000 g | Freq: Every day | ORAL | 3 refills | Status: DC

## 2018-11-02 ENCOUNTER — Telehealth: Payer: Self-pay

## 2018-11-02 NOTE — Telephone Encounter (Signed)
Pt's daughter requesting to speak with a nurse about placing her dad in a nursing facility. Please call back.

## 2018-11-03 ENCOUNTER — Other Ambulatory Visit: Payer: Self-pay

## 2018-11-03 ENCOUNTER — Ambulatory Visit (INDEPENDENT_AMBULATORY_CARE_PROVIDER_SITE_OTHER): Payer: Medicare Other | Admitting: Pharmacist

## 2018-11-03 ENCOUNTER — Telehealth: Payer: Self-pay | Admitting: Pharmacy Technician

## 2018-11-03 DIAGNOSIS — K7460 Unspecified cirrhosis of liver: Secondary | ICD-10-CM | POA: Diagnosis not present

## 2018-11-03 DIAGNOSIS — B182 Chronic viral hepatitis C: Secondary | ICD-10-CM | POA: Diagnosis not present

## 2018-11-03 DIAGNOSIS — J449 Chronic obstructive pulmonary disease, unspecified: Secondary | ICD-10-CM

## 2018-11-03 MED ORDER — LEDIPASVIR-SOFOSBUVIR 90-400 MG PO TABS: 1.0000 | ORAL_TABLET | Freq: Every day | ORAL | 5 refills | Status: DC

## 2018-11-03 NOTE — Telephone Encounter (Addendum)
[facility] Patient Advocate Encounter  Prior Authorization for [facility] Males has been approved.    PA# [id number] Effective dates: 11/03/2018 through 04/21/2019  Patients co-pay is $2703.10.   Copay assistance has been granted from [facility] for $30,000.  The medication will ship to [facility] c/o Evette Georges, nurse manager.  It will arrive tomorrow 11/17/2018 and he will start it 11/18/2018.    He will have a virtual follow up appointment 12/15/2018 so that he will not have to quarantine in his room for 14 days due to the Covid pandemic.  Labs will be drawn at [facility] and results forwarded to our clinic.   [name]. [name] [place] Patient [facility] for Infectious Disease Phone: [phone] Fax:  [phone]

## 2018-11-03 NOTE — Telephone Encounter (Signed)
Sorry about the delay! Was waiting for the TB test to come back. I'll have it done tonight and ready by morning.

## 2018-11-03 NOTE — Patient Instructions (Signed)
It was a pleasure meeting you both today! We will call you when we get information about your [place]. Please call us with any issues, questions, or concerns.   Magda Kiel, PharmD, BCIDP, AAHIVP, CPP Infectious Diseases [place] for Infectious Disease

## 2018-11-03 NOTE — Progress Notes (Addendum)
HPI: Adam Savage is a 68 y.o. male who presents to the [name] clinic to discuss initiating treatment for his Hepatitis C infection.  Patient Active Problem List   Diagnosis Date Noted  . Hepatic cirrhosis due to chronic hepatitis C infection ([place]) 09/16/2018  . Vertigo 07/14/2018  . Compression fracture of L1 lumbar vertebra ([place]) 06/18/2018  . H/O: GI bleed 06/18/2018  . Dementia associated with other underlying disease with behavioral disturbance ([place])   . S/P AVR (aortic valve replacement)   . Internal hemorrhoid, bleeding 01/14/2018  . Chronic hepatitis C without hepatic coma ([place]) 01/12/2018  . 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 Chronic anticoagulation   . Seizure disorder ([place])   . COPD (chronic obstructive pulmonary disease) ([place])   . Type II diabetes mellitus ([place])   . Tobacco abuse 10/30/2015  . Obesity 09/21/2015  . OSA (obstructive sleep apnea) 05/05/2015  . Tension headache, chronic 08/25/2013  . Insomnia 08/25/2013    Patient's Medications  New Prescriptions   LEDIPASVIR-SOFOSBUVIR (HARVONI) 90-400 MG TABS    Take 1 tablet by mouth daily.  Previous Medications   ALBUTEROL (VENTOLIN HFA) 108 (90 BASE) MCG/ACT INHALER    Inhale 2 puffs into the lungs every 6 (six) hours as needed for wheezing or shortness of breath.   ATORVASTATIN (LIPITOR) 10 MG TABLET    TAKE 1 TABLET BY MOUTH EVERY DAY   CARVEDILOL (COREG) 6.25 MG TABLET    Take 1 tablet (6.25 mg total) by mouth 2 (two) times daily with a meal.   DIAZEPAM (VALIUM) 5 MG TABLET    Take about 45 minutes before MRI.   DOCUSATE SODIUM (COLACE) 100 MG CAPSULE    Take 100 mg by mouth 2 (two) times daily.   ESCITALOPRAM (LEXAPRO) 10 MG TABLET    Take 1 tablet (10 mg total) by mouth at bedtime.   FUROSEMIDE (LASIX) 20 MG TABLET    TAKE 2 TABLETS BY MOUTH TWICE A DAY   GLUCOSE BLOOD TEST STRIP    Use as instructed   HYDROCODONE-ACETAMINOPHEN (NORCO) 5-325 MG TABLET    Take 1 tablet by mouth 2  (two) times daily as needed for moderate pain.   INSULIN ASPART (NOVOLOG FLEXPEN) 100 UNIT/ML FLEXPEN    INJECT 10 UNITS INTO THE SKIN 3 (THREE) TIMES DAILY WITH MEALS. ADJUST DOSE AS NEEDED   INSULIN DETEMIR (LEVEMIR) 100 UNIT/ML INJECTION    Inject 0.22 mLs (22 Units total) into the skin at bedtime. Increase Levemir by 2 units when the week average is >200.   INSULIN PEN NEEDLE (ADVOCATE INSULIN PEN NEEDLES) 31G X 5 MM MISC    1 Package by Does not apply route 4 (four) times daily.   LACTULOSE (CHRONULAC) 10 GM/15ML SOLUTION    Take 15 mLs (10 g total) by mouth daily.   LEVETIRACETAM (KEPPRA) 1000 MG TABLET    Take 1 tablet (1,000 mg total) by mouth 2 (two) times daily.   MELATONIN 3 MG TABS    Take 1 tablet by mouth at bedtime as needed.   MEMANTINE (NAMENDA) 5 MG TABLET    Take 1 tablet (5 mg total) by mouth 2 (two) times daily.   ONDANSETRON (ZOFRAN) 4 MG TABLET    Take 1 tablet (4 mg total) by mouth every 8 (eight) hours as needed for nausea or vomiting.   PANTOPRAZOLE (PROTONIX) 40 MG TABLET    Take 1 tablet (40 mg total) by mouth daily.   QUETIAPINE (SEROQUEL)  100 MG TABLET    TAKE HALF A TABLET IN THE MORNING AND 2 TABLETS IN THE EVENING   SPIRONOLACTONE (ALDACTONE) 50 MG TABLET    Take 1 tablet (50 mg total) by mouth daily for 30 days.   WARFARIN (COUMADIN) 4 MG TABLET    Take 1 tablet (4 mg total) by mouth daily.  Modified Medications   No medications on file  Discontinued Medications   No medications on file    Allergies: Allergies  Allergen Reactions  . Morphine And Related Other (See Comments)    Combative     Past Medical History: Past Medical History:  Diagnosis Date  . COPD (chronic obstructive pulmonary disease) ([place])   . Diabetes mellitus without complication ([place])   . H/O diverticulitis of colon 11/03/2012   History of partial colectomy in 2011.  . Mechanical heart valve present   . Overdose of muscle relaxant 08/11/2017  . Pre-diabetes   . Retroperitoneal  hematoma 01/29/2018  . S/P AVR (aortic valve replacement)   . Seizures ([place])   . 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 Marital status: Married    Spouse name: Not on file  . Number of children: 3  . Years of education: Not on file  . Highest education level: 12th grade  Occupational History  . Not on file  Social Needs  . Financial resource strain: Not on file  . Food insecurity    Worry: Not on file    Inability: Not on file  . Transportation needs    Medical: Not on file    Non-medical: Not on file  Tobacco Use  . Smoking status: Current Every Day Smoker    Packs/day: 1.00    Types: Cigarettes  . Smokeless tobacco: Never Used  Substance and Sexual Activity  . Alcohol use: Not Currently  . Drug use: Never  . Sexual activity: Not on file  Lifestyle  . Physical activity    Days per week: Not on file    Minutes per session: Not on file  . Stress: Not on file  Relationships  . Social Herbalist on phone: Not on file    Gets together: Not on file    Attends religious service: Not on file    Active member of club or organization: Not on file    Attends meetings of clubs or organizations: Not on file    Relationship status: Not on file  Other Topics Concern  . Not on file  Social History Narrative   Right handed    2 story house stays on one level    Labs: Hepatitis C Lab Results  Component Value Date   HCVGENOTYPE 1a 10/01/2018   HCVRNAPCRQN 495,000 (H) 10/01/2018   FIBROSTAGE F3-F4 01/12/2018   Hepatitis B Lab Results  Component Value Date   HEPBSAB NON-REACTIVE 01/12/2018   HEPBSAG NON-REACTIVE 10/01/2018   HEPBCAB NON-REACTIVE 01/12/2018   Hepatitis A Lab Results  Component Value Date   HAV REACTIVE (A) 01/12/2018   HIV Lab Results  Component Value Date   HIV Non Reactive 12/22/2017   Lab Results  Component Value Date   CREATININE 1.45 (H) 10/27/2018   CREATININE  1.65 (H) 10/06/2018   CREATININE 1.19 06/18/2018   CREATININE 1.10 06/04/2018   CREATININE 1.20 01/30/2018   Lab Results  Component Value Date   AST 54 (H) 10/27/2018   AST 58 (H) 06/18/2018  AST 152 (H) 01/22/2018   ALT 41 10/27/2018   ALT 41 06/18/2018   ALT 88 (H) 01/22/2018   INR 2.7 10/19/2018   INR 2.2 10/08/2018   INR 2.2 09/24/2018    Assessment: Katrina is here today accompanied by his daughter to discuss starting treatment for his chronic Hepatitis C infection.  He saw Dr. Linus Salmons last month and had labs drawn that showed genotype 1a virus with a Hep C viral load of 495,000.  He does have cirrhosis on abdominal ultrasound but it appears to be compensated with child pugh class A based on his bilirubin and albumin, but he does have a history of thrombocytopenia and his most recent platelet count was 146, which could suggest decompensated cirrhosis. No lesions or nodules noted on ultrasound suggesting HCC.   His treatment course is complicated as he is on multiple medications that interact with our typical medications to treat Hepatitis C.  Will avoid Mavyret as PIs are contraindicated with decompensated cirrhosis and with his platelet count, we will treat as such even though his other labs are stable. He is also on warfarin for s/p AVR which would be an increased risk for bleeding on ribavirin. Furthermore, he is taking Protonix which interacts with both Harvoni and Epclusa.  We will proceed with trying to get him approval for Harvoni x 24 weeks to treat as decompensated cirrhotic Hepatitis C. If his Medicare insurance denies the 24 weeks will proceed with Harvoni alone x 12 weeks. Harvoni is to be taken with Protonix at the same time without food, and I educated him and his daughter about the need to do this.  Also encouraged closer INR monitoring especially when first starting as it can affect his levels.   He lives with his daughter, Caryl Pina, full time and she takes care of him.  It  is beginning to be too much for her, so she asked his primary care physician to help set him up in an assisted living facility.  She started that process yesterday and it appears that he may be placed at [facility] in [place] next week.  She is asking if we need to wait until he has moved in there to start treatment.  I told her it was up to them but we could certainly wait.    We will proceed with getting Harvoni approved through his Medicare insurance and call her with the details once we get an answer. I also educated him to take it once daily (with the protonix) and to not skip doses. We will coordinate delivery to the assisted living facility when the time comes, and I will emphasize the importance of compliance to the charge nurse involved with his care. Will follow up with them once approval status has been achieved.   Plan: - Apply for Harvoni x 24 weeks through insurance - Call daughter once approved (number in epic is hers)  Cassie L. Kuppelweiser, PharmD, BCIDP, AAHIVP, [facility] for Infectious Disease 11/03/2018, 4:29 PM

## 2018-11-03 NOTE — Progress Notes (Signed)
Internal Medicine Clinic Attending  Case discussed with Dr. Lee at the time of the visit.  We reviewed the resident's history and exam and pertinent patient test results.  I agree with the assessment, diagnosis, and plan of care documented in the resident's note.  Alexander Raines, M.D., Ph.D.  

## 2018-11-03 NOTE — Telephone Encounter (Signed)
RCID Patient Teacher, English as a foreign language completed.    The patient is insured through [employer].  We will continue to follow to see if copay assistance is needed.  Adam Savage. [name] [place] Patient [facility] for Infectious Disease Phone: [phone] Fax:  [phone]

## 2018-11-03 NOTE — Telephone Encounter (Signed)
Returned call to daughter. States Nanine Means has not yet received the completed FL2. FL2 is in PCP's box. Signature and date needed on last page. Also, facility is requiring a test for Covid-19. Hubbard Hartshorn, RN, BSN

## 2018-11-04 NOTE — Telephone Encounter (Signed)
Daughter made aware that FL2 is complete and future Caronavirus test has been ordered. She may take patient to [facility] testing site without an appt. Hubbard Hartshorn, RN, BSN

## 2018-11-04 NOTE — Addendum Note (Signed)
Addended by: [name] on: 11/04/2018 04:58 AM   Modules accepted: Orders

## 2018-11-05 ENCOUNTER — Telehealth: Payer: Self-pay | Admitting: *Deleted

## 2018-11-05 ENCOUNTER — Ambulatory Visit (INDEPENDENT_AMBULATORY_CARE_PROVIDER_SITE_OTHER): Payer: Medicare Other | Admitting: *Deleted

## 2018-11-05 ENCOUNTER — Other Ambulatory Visit: Payer: Self-pay

## 2018-11-05 DIAGNOSIS — I69398 Other sequelae of cerebral infarction: Secondary | ICD-10-CM | POA: Diagnosis not present

## 2018-11-05 DIAGNOSIS — I5032 Chronic diastolic (congestive) heart failure: Secondary | ICD-10-CM | POA: Diagnosis not present

## 2018-11-05 DIAGNOSIS — I38 Endocarditis, valve unspecified: Secondary | ICD-10-CM | POA: Diagnosis not present

## 2018-11-05 DIAGNOSIS — F02818 Dementia in other diseases classified elsewhere, unspecified severity, with other behavioral disturbance: Secondary | ICD-10-CM

## 2018-11-05 DIAGNOSIS — E119 Type 2 diabetes mellitus without complications: Secondary | ICD-10-CM

## 2018-11-05 DIAGNOSIS — R6889 Other general symptoms and signs: Secondary | ICD-10-CM | POA: Diagnosis not present

## 2018-11-05 DIAGNOSIS — I69311 Memory deficit following cerebral infarction: Secondary | ICD-10-CM | POA: Diagnosis not present

## 2018-11-05 DIAGNOSIS — Z952 Presence of prosthetic heart valve: Secondary | ICD-10-CM | POA: Diagnosis not present

## 2018-11-05 DIAGNOSIS — I110 Hypertensive heart disease with heart failure: Secondary | ICD-10-CM | POA: Diagnosis not present

## 2018-11-05 DIAGNOSIS — Z20822 Contact with and (suspected) exposure to covid-19: Secondary | ICD-10-CM

## 2018-11-05 DIAGNOSIS — I5030 Unspecified diastolic (congestive) heart failure: Secondary | ICD-10-CM

## 2018-11-05 DIAGNOSIS — F0281 Dementia in other diseases classified elsewhere with behavioral disturbance: Secondary | ICD-10-CM

## 2018-11-05 DIAGNOSIS — G4700 Insomnia, unspecified: Secondary | ICD-10-CM

## 2018-11-05 DIAGNOSIS — M6281 Muscle weakness (generalized): Secondary | ICD-10-CM | POA: Diagnosis not present

## 2018-11-05 DIAGNOSIS — R2681 Unsteadiness on feet: Secondary | ICD-10-CM | POA: Diagnosis not present

## 2018-11-05 DIAGNOSIS — Z794 Long term (current) use of insulin: Secondary | ICD-10-CM

## 2018-11-05 LAB — POCT INR: INR: 2.8 (ref 2.0–3.0)

## 2018-11-05 NOTE — Telephone Encounter (Signed)
Okay I'll make those changes. May need to add separate sheet because they don't have enough space for meds on the FL2 :(

## 2018-11-05 NOTE — Telephone Encounter (Signed)
Call from pt's daughter - stated her father suppose to a covid 19 test done here at [facility] prior to being admitted to a facility; wanted to know if she can go to site in [place] which is closer? I told this will be ok; stated she had called them who stated no appt needed but she wanted to be sure this was ok. Confirmed she is taking him to site - [place] across from [employer] .

## 2018-11-05 NOTE — Patient Instructions (Signed)
Continue coumadin 4mg  daily.  Recheck INR in 2 weeks.  Order given to Kathlee Nations RN [facility] ready to start treatment for Hep C

## 2018-11-05 NOTE — Telephone Encounter (Signed)
Crystal at [facility] called regarding FL2 form:  1. Lonzo Cloud is checked. They would be unable to accept patient if this is correct. Daughter has told them patient does not wander.  2. Per daughter 5 meds are missing from form:  Seroquel 50 mg in AM and 150 mg qHS (Epic has med listed as 50 mg in AM and 200 mg qHS)  Albuterol 2 puffs q 4-6 hours prn (Epic has med listed as 2 puffs q 6 hours prn) Lactulose 15 mL once daily Protonix 40 mg once daily Lexapro 10 mg qHS  Also, levemir is listed on form as 22 units. Per daughter he is receiving 28 units. Nanine Means is unable to titrate up as listed in Epic.  3. They are recommending a carb controlled diet instead of 2 g sodium 2/2 his insulin dependence. They do not cook with salt. They are unable to have 2 diets checked on form.  When these changes have been made please fax back to [facility] at [phone].

## 2018-11-05 NOTE — Telephone Encounter (Signed)
Awesome thank you for letting them know

## 2018-11-06 ENCOUNTER — Telehealth: Payer: Self-pay | Admitting: Internal Medicine

## 2018-11-06 ENCOUNTER — Telehealth: Payer: Self-pay

## 2018-11-06 MED ORDER — INSULIN DETEMIR 100 UNIT/ML ~~LOC~~ SOLN: 28.0000 [IU] | Freq: Every day | SUBCUTANEOUS | 3 refills | Status: DC

## 2018-11-06 MED ORDER — ALBUTEROL SULFATE HFA 108 (90 BASE) MCG/ACT IN AERS: 2.0000 | INHALATION_SPRAY | RESPIRATORY_TRACT | 2 refills | Status: DC | PRN

## 2018-11-06 MED ORDER — QUETIAPINE FUMARATE 100 MG PO TABS: ORAL_TABLET | ORAL | 1 refills | Status: DC

## 2018-11-06 NOTE — Telephone Encounter (Signed)
Faxed FL2 form back to Crystal with [facility] @ [phone].

## 2018-11-06 NOTE — Telephone Encounter (Signed)
Lauren pls contact [place] @ [place]. There can be no corrections on the FL2 forms, the form has to be recompleted, all medicines need to be on one page and the tb results need to be on a separate page. Pls contact for more details   Thanks

## 2018-11-08 LAB — NOVEL CORONAVIRUS, NAA: SARS-CoV-2, NAA: NOT DETECTED

## 2018-11-09 NOTE — Telephone Encounter (Signed)
Returned call to [employer]. The FL2 form will need to be redone. All medicines need to be written on the form. May put additional meds under X-Ray and Additional Information. This is a Database administrator requirement. Addendum for diet will also need to be redone as it has been scratched out. Crystal will fax [place] a new form. Hubbard Hartshorn, RN, BSN

## 2018-11-09 NOTE — Telephone Encounter (Signed)
Oh my, I seem to be getting everything wrong on this form. I'm on night float until next week. Could this wait until then?

## 2018-11-10 NOTE — Telephone Encounter (Signed)
Crystal left message on VM stating they have an admission date set up for 11/12/2018 and will need the paperwork prior to that. Hubbard Hartshorn, RN, BSN

## 2018-11-10 NOTE — Telephone Encounter (Signed)
Crystal returned call. States patient's daughter is extremely anxious to have patient placed immediately, especially now that Covid test is back. Unfortunately, Crystal can not place patient until the paperwork is returned. Requesting this as soon as possible. Hubbard Hartshorn, RN, BSN

## 2018-11-10 NOTE — Telephone Encounter (Signed)
Placed call to [facility]. Left message on self-identified VM asking if it was ok to complete form next week. Awaiting reply. Hubbard Hartshorn, RN, BSN

## 2018-11-10 NOTE — Telephone Encounter (Signed)
I'll come in tomorrow morning and get it done then.

## 2018-11-11 ENCOUNTER — Telehealth: Payer: Self-pay | Admitting: Internal Medicine

## 2018-11-11 NOTE — Telephone Encounter (Signed)
Spoke with Mr.Stuard's daughter, [name], regarding completed paperwork and plan for admission to ALF tomorrow. She expressed understanding. All other questions and concerns addressed.

## 2018-11-12 ENCOUNTER — Telehealth: Payer: Self-pay | Admitting: Internal Medicine

## 2018-11-12 DIAGNOSIS — I69311 Memory deficit following cerebral infarction: Secondary | ICD-10-CM | POA: Diagnosis not present

## 2018-11-12 DIAGNOSIS — Z720 Tobacco use: Secondary | ICD-10-CM

## 2018-11-12 DIAGNOSIS — I5032 Chronic diastolic (congestive) heart failure: Secondary | ICD-10-CM | POA: Diagnosis not present

## 2018-11-12 DIAGNOSIS — I69398 Other sequelae of cerebral infarction: Secondary | ICD-10-CM | POA: Diagnosis not present

## 2018-11-12 DIAGNOSIS — M6281 Muscle weakness (generalized): Secondary | ICD-10-CM | POA: Diagnosis not present

## 2018-11-12 DIAGNOSIS — I110 Hypertensive heart disease with heart failure: Secondary | ICD-10-CM | POA: Diagnosis not present

## 2018-11-12 DIAGNOSIS — R2681 Unsteadiness on feet: Secondary | ICD-10-CM | POA: Diagnosis not present

## 2018-11-12 NOTE — Telephone Encounter (Signed)
Pt has been using otc nicotine patches, his daughter was told they would need to be prescription/ order from pcp. Could you please do a script, print it and we will fax to [facility] thanks

## 2018-11-12 NOTE — Telephone Encounter (Signed)
Adam Savage is requesting a call back [phone]

## 2018-11-13 LAB — POCT INR: INR: 2.9 (ref 2.0–3.0)

## 2018-11-13 MED ORDER — NICOTINE 21 MG/24HR TD PT24: 21.0000 mg | MEDICATED_PATCH | TRANSDERMAL | 3 refills | Status: DC

## 2018-11-13 NOTE — Telephone Encounter (Signed)
Tried to call facility, on hold for 55min. Please see if we can get a fax # for the pharmacy that services the facility so they will not have to pay out of pocket

## 2018-11-13 NOTE — Telephone Encounter (Signed)
Call placed to [place] at [facility] for pharmacy fax number. Left message requesting return call. Hubbard Hartshorn, RN, BSN

## 2018-11-13 NOTE — Telephone Encounter (Signed)
Received additional paperwork from [facility] requiring PCP's signature. Have placed in PCP's box. Hubbard Hartshorn, RN, BSN

## 2018-11-13 NOTE — Telephone Encounter (Signed)
I've printed it out and left it in the outgoing box. Thank you.

## 2018-11-14 DIAGNOSIS — I5032 Chronic diastolic (congestive) heart failure: Secondary | ICD-10-CM | POA: Diagnosis not present

## 2018-11-14 DIAGNOSIS — R2681 Unsteadiness on feet: Secondary | ICD-10-CM | POA: Diagnosis not present

## 2018-11-14 DIAGNOSIS — Z794 Long term (current) use of insulin: Secondary | ICD-10-CM | POA: Diagnosis not present

## 2018-11-14 DIAGNOSIS — I69311 Memory deficit following cerebral infarction: Secondary | ICD-10-CM | POA: Diagnosis not present

## 2018-11-14 DIAGNOSIS — M6281 Muscle weakness (generalized): Secondary | ICD-10-CM | POA: Diagnosis not present

## 2018-11-14 DIAGNOSIS — Z7901 Long term (current) use of anticoagulants: Secondary | ICD-10-CM | POA: Diagnosis not present

## 2018-11-14 DIAGNOSIS — Z87891 Personal history of nicotine dependence: Secondary | ICD-10-CM | POA: Diagnosis not present

## 2018-11-14 DIAGNOSIS — F0391 Unspecified dementia with behavioral disturbance: Secondary | ICD-10-CM | POA: Diagnosis not present

## 2018-11-14 DIAGNOSIS — J449 Chronic obstructive pulmonary disease, unspecified: Secondary | ICD-10-CM | POA: Diagnosis not present

## 2018-11-14 DIAGNOSIS — I69398 Other sequelae of cerebral infarction: Secondary | ICD-10-CM | POA: Diagnosis not present

## 2018-11-14 DIAGNOSIS — E119 Type 2 diabetes mellitus without complications: Secondary | ICD-10-CM | POA: Diagnosis not present

## 2018-11-14 DIAGNOSIS — I110 Hypertensive heart disease with heart failure: Secondary | ICD-10-CM | POA: Diagnosis not present

## 2018-11-16 ENCOUNTER — Encounter: Payer: Self-pay | Admitting: Pharmacy Technician

## 2018-11-16 ENCOUNTER — Ambulatory Visit (INDEPENDENT_AMBULATORY_CARE_PROVIDER_SITE_OTHER): Payer: Medicare Other | Admitting: *Deleted

## 2018-11-16 DIAGNOSIS — Z952 Presence of prosthetic heart valve: Secondary | ICD-10-CM

## 2018-11-16 DIAGNOSIS — Z5181 Encounter for therapeutic drug level monitoring: Secondary | ICD-10-CM

## 2018-11-16 MED FILL — HARVONI 90-400 MG TABLET: 90-400 | 28 days supply | Qty: 28 | Fill #0

## 2018-11-16 NOTE — Patient Instructions (Addendum)
Continue coumadin 4mg  daily. Moved to [facility] 7/30.  Will start Hep C treatment (Harvoni) and need frequent INR.  Can increase or decrease INR.  Waiting on Ins. Preauthorization for med. Recheck INR in 1week Order given to [employer]

## 2018-11-18 ENCOUNTER — Ambulatory Visit (INDEPENDENT_AMBULATORY_CARE_PROVIDER_SITE_OTHER): Payer: Medicare Other | Admitting: *Deleted

## 2018-11-18 DIAGNOSIS — I110 Hypertensive heart disease with heart failure: Secondary | ICD-10-CM | POA: Diagnosis not present

## 2018-11-18 DIAGNOSIS — I5032 Chronic diastolic (congestive) heart failure: Secondary | ICD-10-CM | POA: Diagnosis not present

## 2018-11-18 DIAGNOSIS — Z952 Presence of prosthetic heart valve: Secondary | ICD-10-CM | POA: Diagnosis not present

## 2018-11-18 DIAGNOSIS — R2681 Unsteadiness on feet: Secondary | ICD-10-CM | POA: Diagnosis not present

## 2018-11-18 DIAGNOSIS — M6281 Muscle weakness (generalized): Secondary | ICD-10-CM | POA: Diagnosis not present

## 2018-11-18 DIAGNOSIS — I69311 Memory deficit following cerebral infarction: Secondary | ICD-10-CM | POA: Diagnosis not present

## 2018-11-18 DIAGNOSIS — Z5181 Encounter for therapeutic drug level monitoring: Secondary | ICD-10-CM

## 2018-11-18 DIAGNOSIS — I69398 Other sequelae of cerebral infarction: Secondary | ICD-10-CM | POA: Diagnosis not present

## 2018-11-18 LAB — POCT INR: INR: 1.3 — AB (ref 2.0–3.0)

## 2018-11-18 NOTE — Patient Instructions (Signed)
Take coumadin 6mg  x 3 days then resume 4mg  daily. Moved to [facility] 7/30.  Has not started Harvoni yet.  Will need frequent INR.  Can increase or decrease INR.  Waiting on Ins. Preauthorization for med. Recheck INR in 1week Order given to [employer].  [place]kins RN to confirm Adam Savage has warfarin on med list and is giving to him as ordered.

## 2018-11-19 ENCOUNTER — Telehealth: Payer: Self-pay

## 2018-11-19 NOTE — Telephone Encounter (Signed)
Received a call from pt's daughter Lovey Newcomer). A referral was sent over 09/2018 by Dr. [facility] in [place] for an EGD. Pt's daughter has put off scheduling the appointment due to pt just getting into an assisted living place. She is ready to schedule an appointment and wants to know if pt could be sedated due to his appointment.   [phone]

## 2018-11-20 NOTE — Telephone Encounter (Signed)
According to referral it was denied. Patient is established with [facility].

## 2018-11-23 ENCOUNTER — Telehealth: Payer: Self-pay | Admitting: *Deleted

## 2018-11-23 ENCOUNTER — Ambulatory Visit (INDEPENDENT_AMBULATORY_CARE_PROVIDER_SITE_OTHER): Payer: Medicare Other | Admitting: *Deleted

## 2018-11-23 DIAGNOSIS — M6281 Muscle weakness (generalized): Secondary | ICD-10-CM | POA: Diagnosis not present

## 2018-11-23 DIAGNOSIS — Z5181 Encounter for therapeutic drug level monitoring: Secondary | ICD-10-CM

## 2018-11-23 DIAGNOSIS — I69311 Memory deficit following cerebral infarction: Secondary | ICD-10-CM | POA: Diagnosis not present

## 2018-11-23 DIAGNOSIS — Z952 Presence of prosthetic heart valve: Secondary | ICD-10-CM

## 2018-11-23 DIAGNOSIS — I5032 Chronic diastolic (congestive) heart failure: Secondary | ICD-10-CM | POA: Diagnosis not present

## 2018-11-23 DIAGNOSIS — I110 Hypertensive heart disease with heart failure: Secondary | ICD-10-CM | POA: Diagnosis not present

## 2018-11-23 DIAGNOSIS — R2681 Unsteadiness on feet: Secondary | ICD-10-CM | POA: Diagnosis not present

## 2018-11-23 DIAGNOSIS — I69398 Other sequelae of cerebral infarction: Secondary | ICD-10-CM | POA: Diagnosis not present

## 2018-11-23 LAB — POCT INR: INR: 4.1 — AB (ref 2.0–3.0)

## 2018-11-23 NOTE — Telephone Encounter (Signed)
Called POA to follow up.  Advised her to continue his care at [facility].  She voiced understanding.

## 2018-11-23 NOTE — Telephone Encounter (Signed)
RETURNED Logan / ASHELY. DAUGHTER WANTED TO KNOW IS THIS REFERRAL WAS MEDICALY NECESSARY / OR CAN THIS BE PUT OFFICE TO LATER DATE. ALSO THAT HER FATHER DO NOT DO WELL WHEN BEING PUT TO SLEEP FOR PROCEDURES.  NOTE GIVEN TO ORDERING DR. / DR COE / FOR HIM TO RETURN CALL TO HIS DAUGHTER CONCERNING THIS.

## 2018-11-23 NOTE — Patient Instructions (Signed)
Hold coumadin tonight then decrease dose to 4mg  daily except 2mg  on Tuesdays and Fridays.. Moved to [facility] 7/30. Started Harvoni 11/19/18.  Will need frequent INR.  Can increase or decrease INR.  Recheck INR in 1week Order given to [employer].

## 2018-11-26 ENCOUNTER — Encounter: Payer: Self-pay | Admitting: Internal Medicine

## 2018-11-27 ENCOUNTER — Telehealth: Payer: Self-pay | Admitting: Internal Medicine

## 2018-11-27 NOTE — Telephone Encounter (Signed)
Received call from patient's assisted living facility.  Patient has had a spike in his blood sugar up to 500 after a large meal consisting of potatoes, hush puppies, Goldfish crackers.  Patient is completely asymptomatic, no dizziness, no hypotension, no nausea or vomiting.  He has not missed any of his insulin.  At baseline his sugars tend to run on the higher side around 2 or 300s but not this high usually.  Last insulin dose was 12 units of NovoLog with his meal.  I instructed his caretaker to give an additional 10 units of NovoLog now.  She will check his blood sugar just before bed as she always does and administer Levemir.  At that time I instructed if his CBG is still elevated around 300 or more she can administer an additional 5 units of NovoLog along with the levemir.  Additionally, I will send a message to have the patient scheduled for a diabetes follow-up appointment in our clinic

## 2018-11-30 ENCOUNTER — Telehealth: Payer: Self-pay | Admitting: *Deleted

## 2018-11-30 NOTE — Telephone Encounter (Signed)
Yes sounds good! Approved.

## 2018-11-30 NOTE — Telephone Encounter (Signed)
Thank you for your help. He was suppose to be on low carb diet at his assisted living facility but I guess that didn't happen.

## 2018-11-30 NOTE — Telephone Encounter (Signed)
[facility] calls and states they will need to do pt, inr tomorrow and will call results, VO okayed, do you approve?

## 2018-11-30 NOTE — Telephone Encounter (Signed)
No problem.

## 2018-12-01 ENCOUNTER — Ambulatory Visit (INDEPENDENT_AMBULATORY_CARE_PROVIDER_SITE_OTHER): Payer: Medicare Other

## 2018-12-01 DIAGNOSIS — I110 Hypertensive heart disease with heart failure: Secondary | ICD-10-CM | POA: Diagnosis not present

## 2018-12-01 DIAGNOSIS — R2681 Unsteadiness on feet: Secondary | ICD-10-CM | POA: Diagnosis not present

## 2018-12-01 DIAGNOSIS — I69398 Other sequelae of cerebral infarction: Secondary | ICD-10-CM | POA: Diagnosis not present

## 2018-12-01 DIAGNOSIS — Z7901 Long term (current) use of anticoagulants: Secondary | ICD-10-CM

## 2018-12-01 DIAGNOSIS — Z952 Presence of prosthetic heart valve: Secondary | ICD-10-CM

## 2018-12-01 DIAGNOSIS — M6281 Muscle weakness (generalized): Secondary | ICD-10-CM | POA: Diagnosis not present

## 2018-12-01 DIAGNOSIS — I69311 Memory deficit following cerebral infarction: Secondary | ICD-10-CM | POA: Diagnosis not present

## 2018-12-01 DIAGNOSIS — I5032 Chronic diastolic (congestive) heart failure: Secondary | ICD-10-CM | POA: Diagnosis not present

## 2018-12-01 LAB — POCT INR: INR: 2.4 (ref 2.0–3.0)

## 2018-12-01 NOTE — Patient Instructions (Signed)
Description   Spoke with Kathlee Nations, [facility] RN while with pt, advised to have pt continue on same dosage of Coumadin 4mg  daily except 2mg  on Tuesdays and Fridays.. Moved to [facility] 7/30. Started Harvoni 11/19/18.  Will need frequent INR.  Can increase or decrease INR.  Recheck INR in 1week Order given to [employer].

## 2018-12-03 ENCOUNTER — Ambulatory Visit (INDEPENDENT_AMBULATORY_CARE_PROVIDER_SITE_OTHER): Payer: Medicare Other | Admitting: Internal Medicine

## 2018-12-03 ENCOUNTER — Other Ambulatory Visit: Payer: Self-pay

## 2018-12-03 ENCOUNTER — Other Ambulatory Visit: Payer: Self-pay | Admitting: *Deleted

## 2018-12-03 DIAGNOSIS — E119 Type 2 diabetes mellitus without complications: Secondary | ICD-10-CM

## 2018-12-03 DIAGNOSIS — Z952 Presence of prosthetic heart valve: Secondary | ICD-10-CM

## 2018-12-03 DIAGNOSIS — S32010S Wedge compression fracture of first lumbar vertebra, sequela: Secondary | ICD-10-CM

## 2018-12-03 DIAGNOSIS — I38 Endocarditis, valve unspecified: Secondary | ICD-10-CM

## 2018-12-03 DIAGNOSIS — K7460 Unspecified cirrhosis of liver: Secondary | ICD-10-CM

## 2018-12-03 DIAGNOSIS — M4856XD Collapsed vertebra, not elsewhere classified, lumbar region, subsequent encounter for fracture with routine healing: Secondary | ICD-10-CM | POA: Diagnosis not present

## 2018-12-03 DIAGNOSIS — Z79891 Long term (current) use of opiate analgesic: Secondary | ICD-10-CM

## 2018-12-03 DIAGNOSIS — G8929 Other chronic pain: Secondary | ICD-10-CM

## 2018-12-03 DIAGNOSIS — Z7901 Long term (current) use of anticoagulants: Secondary | ICD-10-CM

## 2018-12-03 DIAGNOSIS — B1920 Unspecified viral hepatitis C without hepatic coma: Secondary | ICD-10-CM

## 2018-12-03 DIAGNOSIS — Z794 Long term (current) use of insulin: Secondary | ICD-10-CM | POA: Diagnosis not present

## 2018-12-03 DIAGNOSIS — I5030 Unspecified diastolic (congestive) heart failure: Secondary | ICD-10-CM

## 2018-12-03 DIAGNOSIS — F0390 Unspecified dementia without behavioral disturbance: Secondary | ICD-10-CM

## 2018-12-03 DIAGNOSIS — F17210 Nicotine dependence, cigarettes, uncomplicated: Secondary | ICD-10-CM | POA: Diagnosis not present

## 2018-12-03 MED ORDER — HYDROCODONE-ACETAMINOPHEN 5-325 MG PO TABS: 1.0000 | ORAL_TABLET | Freq: Every day | ORAL | 0 refills | Status: AC | PRN

## 2018-12-03 NOTE — Assessment & Plan Note (Signed)
Nursing manager reports most glucometer readings around low 200s (200-220s), Mentions having difficulty with adherence to low carb diet due to family members bringing snacks such as chips and crackers. No nausea, vomiting, polyuria, polydipsia. No hypoglycemic events reported.  - Emphasized adherence to low carb diet - Increase Levemir to 28 units qhs, Novolog 12 units TID qc + additional dose for readings >200 - F/u in 2 months for Hgb a1c check

## 2018-12-03 NOTE — Telephone Encounter (Signed)
Spoke with the Caregiver-Daughter Sharyn Lull) Appt has been sch for a [place] visit for today 12/03/2018 @ 2:45 p.m.to f/u. Per the daughter the Facility does not want the pt's to come to an in person visit at this time due to COVID-19.   The patient is @  [facility].  Per The daughter's request please contact her father Directly @ [phone] along with the Nurse Manager Sharyn Lull )who will  Be able to go over his recent BS.  Please contact the patient daughter directly @ [phone] if you have any questions.

## 2018-12-03 NOTE — Assessment & Plan Note (Addendum)
Currently havign steadily weight loss after resuming his furosemide last visit. Denies any light-headedness, dizziness. Per nursing manager at ALF, no significant pitting edema or JVD. Most recent weight down to 300.1 lbs. Adam Savage denies any chest pain, palpitations, dyspnea or orthopnea.   - CMP with home health to monitor renal fx and electrolytes - C/w current regimen

## 2018-12-03 NOTE — Progress Notes (Signed)
[facility] Internal Medicine Residency Telephone Encounter Continuity Care Appointment  HPI:   This telephone encounter was created for [name] on 12/03/2018 for the following purpose/cc diabetes. [name] is a 68 yo M w/ PMh of HFpEF, Hep C (currently on treatment), Cirrhosis, Dementia, Lumbar compression fracture, On AC for mechanical valve and IDDM who presents for telehealth visit for management of his chronic conditions. He was evaluated with assistance of Herbalist at [facility]).  He states that he has been doing well with taking his medications as prescribed.  He mentions however being bothered by significant exacerbation of his chronic lower back pain.  He mentions that Dr. Luna Savage had sent him for an MRI but he was unable to tolerate it due to agitation.  He mentions that the plan is for him to retry the MRI with Valium.  Discussed with importance of avoiding triggers and possible if his receiving 8 with physical therapy which she states he will be willing to try.  Also he has not had his Norco refilled for the last 2 months and we discussed titrating down his opioid use to once daily.  He states he would be open to trying.  For his diabetes Adam Savage states that his blood sugars have been better controlled recently.  She mentions that he was supposed to receive a low carb diet which she has been compliant with but he does get irritated when he does not have his daily snacks.  He mentions that family has been bringing groceries into his room which she has been consuming in high quantities low resulting in high blood sugars.  She mentions no episodes of nausea, vomiting, malaise.  Discussed that I will speak with family regarding need for low-carb diet.  She also reports that he has been losing weight consistently since starting his furosemide dose and his latest weight is at 300.1 pounds.   Past Medical History:  Past Medical History:  Diagnosis Date  . COPD (chronic  obstructive pulmonary disease) ([place])   . Diabetes mellitus without complication ([place])   . H/O diverticulitis of colon 11/03/2012   History of partial colectomy in 2011.  . Mechanical heart valve present   . Overdose of muscle relaxant 08/11/2017  . Pre-diabetes   . Retroperitoneal hematoma 01/29/2018  . S/P AVR (aortic valve replacement)   . Seizures ([place])   . 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  Eyes: Negative for blurred vision.  Respiratory: Negative for shortness of breath and wheezing.   Cardiovascular: Negative for chest pain, palpitations and leg swelling.  Gastrointestinal: Negative for constipation, diarrhea, nausea and vomiting.  Genitourinary: Negative for dysuria, frequency and urgency.  Musculoskeletal: Positive for back pain.  Neurological: Negative for dizziness and headaches.     Assessment / Plan / Recommendations:   Please see A&P under problem oriented charting for assessment of the patient's acute and chronic medical conditions.   As always, pt is advised that if symptoms worsen or new symptoms arise, they should go to an urgent care facility or to to ER for further evaluation.   Consent and Medical Decision Making:   Patient discussed with Dr. Evette Doffing  This is a telephone encounter between Adam Savage and Adam Savage on 12/03/2018 for diabetes. The visit was conducted with the patient located at home and Adam Savage at [facility]. The patient's identity was confirmed using their DOB and  current address. The patient has consented to being evaluated through a telephone encounter and understands the associated risks (an examination cannot be done and the patient may need to come in for an appointment) / benefits (allows the patient to remain at home, decreasing exposure to coronavirus). I personally spent 15 minutes on medical discussion.

## 2018-12-03 NOTE — Telephone Encounter (Signed)
Received fax dated 12/01/18 from [facility] in [place] with the following message:  "Resident is completely out of Hydrocodone-Acetaminophen 5-325mg . May we please get a new hard script for this medication?" Will forward request to pcp as pt is scheduled for a telehealth visit today .Regenia Skeeter, Darlene Cassady8/20/202011:26 AM

## 2018-12-03 NOTE — Assessment & Plan Note (Signed)
Presents w/ complaints of worsening lower back pain. Last Norco filled 60 days prior with 60 pills. Denies any numbness, tingling, urinary/fecal incontinence. Known lumbar fracture currently being worked up by Dr.Keeling (ortho). MRI ordered but unable to tolerate due to agitation. Current plan to repeat with Valium on board. Discussed importance of regular exercise and not laying in bed all day.  - Will send PT order to home health - Norco 5mg  daily PRN #30 - F/u with ortho

## 2018-12-04 DIAGNOSIS — M6281 Muscle weakness (generalized): Secondary | ICD-10-CM | POA: Diagnosis not present

## 2018-12-04 DIAGNOSIS — I69311 Memory deficit following cerebral infarction: Secondary | ICD-10-CM | POA: Diagnosis not present

## 2018-12-04 DIAGNOSIS — R2681 Unsteadiness on feet: Secondary | ICD-10-CM | POA: Diagnosis not present

## 2018-12-04 DIAGNOSIS — I69398 Other sequelae of cerebral infarction: Secondary | ICD-10-CM | POA: Diagnosis not present

## 2018-12-04 DIAGNOSIS — I110 Hypertensive heart disease with heart failure: Secondary | ICD-10-CM | POA: Diagnosis not present

## 2018-12-04 DIAGNOSIS — I5032 Chronic diastolic (congestive) heart failure: Secondary | ICD-10-CM | POA: Diagnosis not present

## 2018-12-07 ENCOUNTER — Telehealth: Payer: Self-pay | Admitting: Internal Medicine

## 2018-12-07 DIAGNOSIS — S32010S Wedge compression fracture of first lumbar vertebra, sequela: Secondary | ICD-10-CM

## 2018-12-07 NOTE — Progress Notes (Signed)
Internal Medicine Clinic Attending ° °Case discussed with Dr. Lee at the time of the visit.  We reviewed the resident’s history and exam and pertinent patient test results.  I agree with the assessment, diagnosis, and plan of care documented in the resident’s note.  °

## 2018-12-07 NOTE — Telephone Encounter (Signed)
I'll give her a call. Thank you 

## 2018-12-07 NOTE — Telephone Encounter (Signed)
Discussed with Adam Savage's daughter, Adam Savage, regarding recent office visit and concerns regarding recent hyperglycemic episode. She mentions that she had expressed to her father that the snacks are suppose to last him for a week but he had gone through them in a span of couple days and that she understands that she will not bring any more snacks and will let the ALF staff members provide for his dietary needs. Also discussed long term prognosis and decided on Code Status. Discussed goals of care and his progressive decline, especially with worsening cognition due to his dementia and plan to have that in writing with filing of MOST form in the future. Adam Savage expressed understanding and states she will check with rest of her family members prior to completing the form. All other concerns addressed.

## 2018-12-07 NOTE — Telephone Encounter (Signed)
Talked to pt's daughter, Caryl Pina - stated pt had a virtual appt with Dr Truman Hayward; pt cannot remember details so she would like Dr Truman Hayward to give her a call. Thanks

## 2018-12-07 NOTE — Telephone Encounter (Signed)
Pt daughter would like an update on pt appt. pls contact [phone]

## 2018-12-08 DIAGNOSIS — I69311 Memory deficit following cerebral infarction: Secondary | ICD-10-CM | POA: Diagnosis not present

## 2018-12-08 DIAGNOSIS — M6281 Muscle weakness (generalized): Secondary | ICD-10-CM | POA: Diagnosis not present

## 2018-12-08 DIAGNOSIS — R2681 Unsteadiness on feet: Secondary | ICD-10-CM | POA: Diagnosis not present

## 2018-12-08 DIAGNOSIS — I5032 Chronic diastolic (congestive) heart failure: Secondary | ICD-10-CM | POA: Diagnosis not present

## 2018-12-08 DIAGNOSIS — I110 Hypertensive heart disease with heart failure: Secondary | ICD-10-CM | POA: Diagnosis not present

## 2018-12-08 DIAGNOSIS — I69398 Other sequelae of cerebral infarction: Secondary | ICD-10-CM | POA: Diagnosis not present

## 2018-12-10 DIAGNOSIS — R2681 Unsteadiness on feet: Secondary | ICD-10-CM | POA: Diagnosis not present

## 2018-12-10 DIAGNOSIS — I5032 Chronic diastolic (congestive) heart failure: Secondary | ICD-10-CM | POA: Diagnosis not present

## 2018-12-10 DIAGNOSIS — I110 Hypertensive heart disease with heart failure: Secondary | ICD-10-CM | POA: Diagnosis not present

## 2018-12-10 DIAGNOSIS — M6281 Muscle weakness (generalized): Secondary | ICD-10-CM | POA: Diagnosis not present

## 2018-12-10 DIAGNOSIS — I69398 Other sequelae of cerebral infarction: Secondary | ICD-10-CM | POA: Diagnosis not present

## 2018-12-10 DIAGNOSIS — I69311 Memory deficit following cerebral infarction: Secondary | ICD-10-CM | POA: Diagnosis not present

## 2018-12-10 MED FILL — HARVONI 90-400 MG TABLET: 90-400 | 28 days supply | Qty: 28 | Fill #1

## 2018-12-11 ENCOUNTER — Ambulatory Visit (INDEPENDENT_AMBULATORY_CARE_PROVIDER_SITE_OTHER): Payer: Medicare Other | Admitting: Cardiovascular Disease

## 2018-12-11 DIAGNOSIS — R2681 Unsteadiness on feet: Secondary | ICD-10-CM | POA: Diagnosis not present

## 2018-12-11 DIAGNOSIS — I5032 Chronic diastolic (congestive) heart failure: Secondary | ICD-10-CM | POA: Diagnosis not present

## 2018-12-11 DIAGNOSIS — I69311 Memory deficit following cerebral infarction: Secondary | ICD-10-CM | POA: Diagnosis not present

## 2018-12-11 DIAGNOSIS — I69398 Other sequelae of cerebral infarction: Secondary | ICD-10-CM | POA: Diagnosis not present

## 2018-12-11 DIAGNOSIS — I110 Hypertensive heart disease with heart failure: Secondary | ICD-10-CM | POA: Diagnosis not present

## 2018-12-11 DIAGNOSIS — Z7901 Long term (current) use of anticoagulants: Secondary | ICD-10-CM

## 2018-12-11 DIAGNOSIS — M6281 Muscle weakness (generalized): Secondary | ICD-10-CM | POA: Diagnosis not present

## 2018-12-11 LAB — POCT INR: INR: 1.6 — AB (ref 2.0–3.0)

## 2018-12-14 DIAGNOSIS — Z87891 Personal history of nicotine dependence: Secondary | ICD-10-CM | POA: Diagnosis not present

## 2018-12-14 DIAGNOSIS — Z7901 Long term (current) use of anticoagulants: Secondary | ICD-10-CM | POA: Diagnosis not present

## 2018-12-14 DIAGNOSIS — M6281 Muscle weakness (generalized): Secondary | ICD-10-CM | POA: Diagnosis not present

## 2018-12-14 DIAGNOSIS — I5032 Chronic diastolic (congestive) heart failure: Secondary | ICD-10-CM | POA: Diagnosis not present

## 2018-12-14 DIAGNOSIS — E119 Type 2 diabetes mellitus without complications: Secondary | ICD-10-CM | POA: Diagnosis not present

## 2018-12-14 DIAGNOSIS — J449 Chronic obstructive pulmonary disease, unspecified: Secondary | ICD-10-CM | POA: Diagnosis not present

## 2018-12-14 DIAGNOSIS — F0391 Unspecified dementia with behavioral disturbance: Secondary | ICD-10-CM | POA: Diagnosis not present

## 2018-12-14 DIAGNOSIS — I110 Hypertensive heart disease with heart failure: Secondary | ICD-10-CM | POA: Diagnosis not present

## 2018-12-14 DIAGNOSIS — I69311 Memory deficit following cerebral infarction: Secondary | ICD-10-CM | POA: Diagnosis not present

## 2018-12-14 DIAGNOSIS — R2681 Unsteadiness on feet: Secondary | ICD-10-CM | POA: Diagnosis not present

## 2018-12-14 DIAGNOSIS — I69398 Other sequelae of cerebral infarction: Secondary | ICD-10-CM | POA: Diagnosis not present

## 2018-12-14 DIAGNOSIS — Z794 Long term (current) use of insulin: Secondary | ICD-10-CM | POA: Diagnosis not present

## 2018-12-14 NOTE — Addendum Note (Signed)
Addended by: Modena Nunnery D on: 12/14/2018 09:30 AM   Modules accepted: Orders

## 2018-12-15 ENCOUNTER — Ambulatory Visit (INDEPENDENT_AMBULATORY_CARE_PROVIDER_SITE_OTHER): Payer: Medicare Other | Admitting: Pharmacist

## 2018-12-15 ENCOUNTER — Other Ambulatory Visit: Payer: Self-pay

## 2018-12-15 DIAGNOSIS — I5032 Chronic diastolic (congestive) heart failure: Secondary | ICD-10-CM | POA: Diagnosis not present

## 2018-12-15 DIAGNOSIS — M6281 Muscle weakness (generalized): Secondary | ICD-10-CM | POA: Diagnosis not present

## 2018-12-15 DIAGNOSIS — B182 Chronic viral hepatitis C: Secondary | ICD-10-CM

## 2018-12-15 DIAGNOSIS — I69311 Memory deficit following cerebral infarction: Secondary | ICD-10-CM | POA: Diagnosis not present

## 2018-12-15 DIAGNOSIS — I69398 Other sequelae of cerebral infarction: Secondary | ICD-10-CM | POA: Diagnosis not present

## 2018-12-15 DIAGNOSIS — K7460 Unspecified cirrhosis of liver: Secondary | ICD-10-CM

## 2018-12-15 DIAGNOSIS — R2681 Unsteadiness on feet: Secondary | ICD-10-CM | POA: Diagnosis not present

## 2018-12-15 DIAGNOSIS — I110 Hypertensive heart disease with heart failure: Secondary | ICD-10-CM | POA: Diagnosis not present

## 2018-12-15 NOTE — Progress Notes (Signed)
Virtual Telemedicine Visit  I connected with Adam Savage on 12/15/18 at  1:45 PM EDT by telephone and verified that I am speaking with the correct person using two identifiers.   I discussed the limitations of evaluation and management by telemedicine and the availability of in person appointments. The patient expressed understanding and agreed to proceed.  HPI: Adam Savage is a 68 y.o. male who presents for Hepatitis C follow-up.  Medication: Harvoni x 24 weeks  Start Date: 11/18/18  Hepatitis C Genotype: 1a  Fibrosis Score: F4 decompensated cirrhosis (low platelet count)  Hepatitis C RNA: 495,000 on 10/01/2018  Patient Active Problem List   Diagnosis Date Noted  . Hepatic cirrhosis due to chronic hepatitis C infection ([place]) 09/16/2018  . Vertigo 07/14/2018  . Compression fracture of L1 lumbar vertebra ([place]) 06/18/2018  . H/O: GI bleed 06/18/2018  . 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 S/P AVR (aortic valve replacement)   . Internal hemorrhoid, bleeding 01/14/2018  . Chronic hepatitis C without hepatic coma ([place]) 01/12/2018  . Diastolic CHF due to valvular disease ([place]) 12/23/2017  . Chronic anticoagulation   . Seizure disorder ([place])   . COPD (chronic obstructive pulmonary disease) ([place])   . Type II diabetes mellitus ([place])   . Tobacco abuse 10/30/2015  . Obesity 09/21/2015  . OSA (obstructive sleep apnea) 05/05/2015  . Tension headache, chronic 08/25/2013  . Insomnia 08/25/2013    Patient's Medications  New Prescriptions   No medications on file  Previous Medications   ALBUTEROL (VENTOLIN HFA) 108 (90 BASE) MCG/ACT INHALER    Inhale 2 puffs into the lungs every 4 (four) hours as needed for wheezing or shortness of breath.   ATORVASTATIN (LIPITOR) 10 MG TABLET    TAKE 1 TABLET BY MOUTH EVERY DAY   CARVEDILOL (COREG) 6.25 MG TABLET    Take 1 tablet (6.25 mg total) by mouth 2 (two) times daily with a meal.   DIAZEPAM  (VALIUM) 5 MG TABLET    Take about 45 minutes before MRI.   DOCUSATE SODIUM (COLACE) 100 MG CAPSULE    Take 100 mg by mouth 2 (two) times daily.   ESCITALOPRAM (LEXAPRO) 10 MG TABLET    Take 1 tablet (10 mg total) by mouth at bedtime.   FUROSEMIDE (LASIX) 20 MG TABLET    TAKE 2 TABLETS BY MOUTH TWICE A DAY   GLUCOSE BLOOD TEST STRIP    Use as instructed   HYDROCODONE-ACETAMINOPHEN (NORCO) 5-325 MG TABLET    Take 1-2 tablets by mouth daily as needed for moderate pain.   INSULIN ASPART (NOVOLOG FLEXPEN) 100 UNIT/ML FLEXPEN    INJECT 10 UNITS INTO THE SKIN 3 (THREE) TIMES DAILY WITH MEALS. ADJUST DOSE AS NEEDED   INSULIN DETEMIR (LEVEMIR) 100 UNIT/ML INJECTION    Inject 0.28 mLs (28 Units total) into the skin at bedtime. Increase Levemir by 2 units when the week average is >200.   INSULIN PEN NEEDLE (ADVOCATE INSULIN PEN NEEDLES) 31G X 5 MM MISC    1 Package by Does not apply route 4 (four) times daily.   LACTULOSE (CHRONULAC) 10 GM/15ML SOLUTION    Take 15 mLs (10 g total) by mouth daily.   LEDIPASVIR-SOFOSBUVIR (HARVONI) 90-400 MG TABS    Take 1 tablet by mouth daily.   LEVETIRACETAM (KEPPRA) 1000 MG TABLET    Take 1 tablet (1,000 mg total) by mouth 2 (two) times daily.   MELATONIN 3 MG TABS  Take 1 tablet by mouth at bedtime as needed.   MEMANTINE (NAMENDA) 5 MG TABLET    Take 1 tablet (5 mg total) by mouth 2 (two) times daily.   NICOTINE (NICODERM CQ - DOSED IN MG/24 HOURS) 21 MG/24HR PATCH    Place 1 patch (21 mg total) onto the skin daily.   ONDANSETRON (ZOFRAN) 4 MG TABLET    Take 1 tablet (4 mg total) by mouth every 8 (eight) hours as needed for nausea or vomiting.   PANTOPRAZOLE (PROTONIX) 40 MG TABLET    Take 1 tablet (40 mg total) by mouth daily.   QUETIAPINE (SEROQUEL) 100 MG TABLET    TAKE HALF A TABLET IN THE MORNING AND 1.5 TABLETS IN THE EVENING   SPIRONOLACTONE (ALDACTONE) 50 MG TABLET    Take 1 tablet (50 mg total) by mouth daily for 30 days.   WARFARIN (COUMADIN) 4 MG TABLET     Take 1 tablet (4 mg total) by mouth daily.  Modified Medications   No medications on file  Discontinued Medications   No medications on file    Allergies: Allergies  Allergen Reactions  . Morphine And Related Other (See Comments)    Combative     Past Medical History: Past Medical History:  Diagnosis Date  . COPD (chronic obstructive pulmonary disease) (Adam Savage)   . Diabetes mellitus without complication (Adam Savage)   . H/O diverticulitis of colon 11/03/2012   History of partial colectomy in 2011.  . Mechanical heart valve present   . Overdose of muscle relaxant 08/11/2017  . Pre-diabetes   . Retroperitoneal hematoma 01/29/2018  . S/P AVR (aortic valve replacement)   . Seizures (Adam Savage)   . 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 Marital status: Married    Spouse name: Not on file  . Number of children: 3  . Years of education: Not on file  . Highest education level: 12th grade  Occupational History  . Not on file  Social Needs  . Financial resource strain: Not on file  . Food insecurity    Worry: Not on file    Inability: Not on file  . Transportation needs    Medical: Not on file    Non-medical: Not on file  Tobacco Use  . Smoking status: Current Every Day Smoker    Packs/day: 1.00    Types: Cigarettes  . Smokeless tobacco: Never Used  Substance and Sexual Activity  . Alcohol use: Not Currently  . Drug use: Never  . Sexual activity: Not on file  Lifestyle  . Physical activity    Days per week: Not on file    Minutes per session: Not on file  . Stress: Not on file  Relationships  . Social Herbalist on phone: Not on file    Gets together: Not on file    Attends religious service: Not on file    Active member of club or organization: Not on file    Attends meetings of clubs or organizations: Not on file    Relationship status: Not on file  Other Topics Concern  . Not on file  Social  History Narrative   Right handed    2 story house stays on one level    Labs: Hepatitis C Lab Results  Component Value Date   HCVGENOTYPE 1a 10/01/2018   HCVRNAPCRQN 495,000 (H) 10/01/2018   FIBROSTAGE F3-F4 01/12/2018   Hepatitis B  Lab Results  Component Value Date   HEPBSAB NON-REACTIVE 01/12/2018   HEPBSAG NON-REACTIVE 10/01/2018   HEPBCAB NON-REACTIVE 01/12/2018   Hepatitis A Lab Results  Component Value Date   HAV REACTIVE (A) 01/12/2018   HIV Lab Results  Component Value Date   HIV Non Reactive 12/22/2017   Lab Results  Component Value Date   CREATININE 1.45 (H) 10/27/2018   CREATININE 1.65 (H) 10/06/2018   CREATININE 1.19 06/18/2018   CREATININE 1.10 06/04/2018   CREATININE 1.20 01/30/2018   Lab Results  Component Value Date   AST 54 (H) 10/27/2018   AST 58 (H) 06/18/2018   AST 152 (H) 01/22/2018   ALT 41 10/27/2018   ALT 41 06/18/2018   ALT 88 (H) 01/22/2018   INR 1.6 (A) 12/11/2018   INR 2.4 12/01/2018   INR 4.1 (A) 11/23/2018    Assessment: I connected with nurse manager, Evette Georges [phone]), at [facility] assisted living facility regarding patient's chronic Hepatitis C infection.  She confirmed that he is receiving Harvoni every morning and has not missed any doses.  She has not noticed any side effects or heard any new complaints from him.  They have already received the 2nd month of Harvoni from [facility]. He will be taking this for 6 months.   He is also taking Protonix and it is given at the same time as Harvoni which is appropriate.  She is willing to draw a Hep C viral load for [place] and fax it to me to verify the medication is working.  She will call with any issues.  I sent over an order for his labs. I will have him do a virtual visit with Dr. Linus Salmons in 3 months.   Plan: - Continue Harvoni x 24 weeks - Hep C RNA quantitative order sent to nursing home fax # [phone] - F/u for virtual visit with Dr.  Linus Salmons 12/2 at 230pm  I discussed the assessment and treatment plan with the patient. The patient was provided an opportunity to ask questions and all were answered. The patient agreed with the plan and demonstrated an understanding of the instructions.  I provided 20 minutes of non-face-to-face time during this encounter.  Tricha Ruggirello L. Tripton Ned, PharmD, BCIDP, AAHIVP, [place] for Infectious Disease 12/15/2018, 3:37 PM

## 2018-12-15 NOTE — Addendum Note (Signed)
Addended by: Hulan Fray on: 12/15/2018 04:55 PM   Modules accepted: Orders

## 2018-12-16 DIAGNOSIS — E1051 Type 1 diabetes mellitus with diabetic peripheral angiopathy without gangrene: Secondary | ICD-10-CM | POA: Diagnosis not present

## 2018-12-16 DIAGNOSIS — L84 Corns and callosities: Secondary | ICD-10-CM | POA: Diagnosis not present

## 2018-12-17 ENCOUNTER — Ambulatory Visit (INDEPENDENT_AMBULATORY_CARE_PROVIDER_SITE_OTHER): Payer: Medicare Other | Admitting: Pharmacist

## 2018-12-17 DIAGNOSIS — Z7901 Long term (current) use of anticoagulants: Secondary | ICD-10-CM | POA: Diagnosis not present

## 2018-12-17 DIAGNOSIS — I5032 Chronic diastolic (congestive) heart failure: Secondary | ICD-10-CM | POA: Diagnosis not present

## 2018-12-17 DIAGNOSIS — E119 Type 2 diabetes mellitus without complications: Secondary | ICD-10-CM | POA: Diagnosis not present

## 2018-12-17 LAB — POCT INR: INR: 2 (ref 2.0–3.0)

## 2018-12-20 ENCOUNTER — Other Ambulatory Visit: Payer: Self-pay

## 2018-12-24 ENCOUNTER — Ambulatory Visit: Payer: Self-pay | Admitting: *Deleted

## 2018-12-24 LAB — POCT INR: INR: 2.8 (ref 2.0–3.0)

## 2018-12-24 NOTE — Patient Instructions (Signed)
Adam Savage cell [phone] Spoke with Adam Savage, [facility] RN, advised to have pt to continue taking Coumadin 4mg  daily except 2mg  on Tuesdays and Fridays. Moved to [facility] 7/30. Started Harvoni 11/19/18-Will need frequent INR.  Can increase or decrease INR.  Recheck INR in 1 week. Order given to [employer] [facility]

## 2018-12-30 ENCOUNTER — Other Ambulatory Visit: Payer: Self-pay

## 2018-12-30 ENCOUNTER — Ambulatory Visit (INDEPENDENT_AMBULATORY_CARE_PROVIDER_SITE_OTHER): Payer: Medicare Other | Admitting: *Deleted

## 2018-12-30 ENCOUNTER — Ambulatory Visit: Payer: Medicare Other | Admitting: Internal Medicine

## 2018-12-30 DIAGNOSIS — Z952 Presence of prosthetic heart valve: Secondary | ICD-10-CM

## 2018-12-30 DIAGNOSIS — Z5181 Encounter for therapeutic drug level monitoring: Secondary | ICD-10-CM | POA: Diagnosis not present

## 2018-12-30 DIAGNOSIS — S32010S Wedge compression fracture of first lumbar vertebra, sequela: Secondary | ICD-10-CM

## 2018-12-30 LAB — POCT INR: INR: 2.8 (ref 2.0–3.0)

## 2018-12-30 NOTE — Patient Instructions (Signed)
Adam Savage cell [phone] Spoke with Adam Savage, [facility] RN, advised to have pt to continue taking Coumadin 4mg  daily except 2mg  on Tuesdays and Fridays. Moved to [facility] 7/30. Started Harvoni 11/19/18-Will need frequent INR.  Can increase or decrease INR.  Recheck INR in 1 week. Order given to [employer] [facility]

## 2018-12-30 NOTE — Progress Notes (Signed)
  [facility] Internal Medicine Residency Telephone Encounter Continuity Care Appointment  Attempted to call patient. Reached patient's daughter Caryl Pina who mentioned that patient is currently living at [facility]. [facility] and patient did not know why the visit was scheduled. Review of EMR and conversation with Glenda did not reveal why the visit was scheduled. I will forward to pcp to follow up.   Lars Mage, MD Internal Medicine PGY3 [id number] 12/30/2018, 12:41 PM

## 2019-01-05 ENCOUNTER — Encounter: Payer: Self-pay | Admitting: *Deleted

## 2019-01-06 ENCOUNTER — Telehealth: Payer: Self-pay | Admitting: *Deleted

## 2019-01-06 ENCOUNTER — Ambulatory Visit (INDEPENDENT_AMBULATORY_CARE_PROVIDER_SITE_OTHER): Payer: Medicare Other | Admitting: Pharmacist

## 2019-01-06 ENCOUNTER — Other Ambulatory Visit: Payer: Self-pay | Admitting: Internal Medicine

## 2019-01-06 DIAGNOSIS — Z952 Presence of prosthetic heart valve: Secondary | ICD-10-CM | POA: Diagnosis not present

## 2019-01-06 DIAGNOSIS — B20 Human immunodeficiency virus [HIV] disease: Secondary | ICD-10-CM | POA: Diagnosis not present

## 2019-01-06 DIAGNOSIS — Z7901 Long term (current) use of anticoagulants: Secondary | ICD-10-CM | POA: Diagnosis not present

## 2019-01-06 LAB — POCT INR: INR: 2.3 (ref 2.0–3.0)

## 2019-01-06 MED FILL — HARVONI 90-400 MG TABLET: 90-400 | 28 days supply | Qty: 28 | Fill #2

## 2019-01-06 NOTE — Telephone Encounter (Signed)
INR 2.3   Please call Kathlee Nations with [facility] [phone]

## 2019-01-06 NOTE — Telephone Encounter (Signed)
See anticoagulation encounter from 01/06/2019

## 2019-01-09 LAB — HCV RNA NAA QUAL RFX TO QUANT: HCV RNA NAA Qualitative: NEGATIVE

## 2019-01-12 ENCOUNTER — Telehealth: Payer: Self-pay | Admitting: Internal Medicine

## 2019-01-12 ENCOUNTER — Ambulatory Visit (INDEPENDENT_AMBULATORY_CARE_PROVIDER_SITE_OTHER): Payer: Medicare Other | Admitting: Cardiovascular Disease

## 2019-01-12 DIAGNOSIS — Z7901 Long term (current) use of anticoagulants: Secondary | ICD-10-CM | POA: Diagnosis not present

## 2019-01-12 LAB — POCT INR: INR: 2.7 (ref 2.0–3.0)

## 2019-01-12 NOTE — Telephone Encounter (Signed)
Pt's daughter Caryl Pina requesting a call back.  Father has had 2 falls at [facility] in the past week and is now having severe back pain and would like to discuss his care as soon as possible

## 2019-01-12 NOTE — Patient Instructions (Signed)
Description   Kathlee Nations cell [phone] Spoke with Kathlee Nations, [facility] RN, advised to have pt to continue taking Coumadin 4mg  daily except 2mg  on Tuesdays and Fridays. Moved to [facility] 7/30. Started Harvoni 11/19/18 (can decrease INR).  Recheck INR in 3 weeks. Order given to [employer] [facility]

## 2019-01-13 DIAGNOSIS — I110 Hypertensive heart disease with heart failure: Secondary | ICD-10-CM | POA: Diagnosis not present

## 2019-01-13 DIAGNOSIS — I5032 Chronic diastolic (congestive) heart failure: Secondary | ICD-10-CM | POA: Diagnosis not present

## 2019-01-13 DIAGNOSIS — R2681 Unsteadiness on feet: Secondary | ICD-10-CM | POA: Diagnosis not present

## 2019-01-13 DIAGNOSIS — I69311 Memory deficit following cerebral infarction: Secondary | ICD-10-CM | POA: Diagnosis not present

## 2019-01-13 DIAGNOSIS — E119 Type 2 diabetes mellitus without complications: Secondary | ICD-10-CM | POA: Diagnosis not present

## 2019-01-13 DIAGNOSIS — Z794 Long term (current) use of insulin: Secondary | ICD-10-CM | POA: Diagnosis not present

## 2019-01-13 DIAGNOSIS — Z23 Encounter for immunization: Secondary | ICD-10-CM | POA: Diagnosis not present

## 2019-01-13 DIAGNOSIS — Z7901 Long term (current) use of anticoagulants: Secondary | ICD-10-CM | POA: Diagnosis not present

## 2019-01-13 DIAGNOSIS — Z87891 Personal history of nicotine dependence: Secondary | ICD-10-CM | POA: Diagnosis not present

## 2019-01-13 DIAGNOSIS — I69398 Other sequelae of cerebral infarction: Secondary | ICD-10-CM | POA: Diagnosis not present

## 2019-01-13 DIAGNOSIS — M6281 Muscle weakness (generalized): Secondary | ICD-10-CM | POA: Diagnosis not present

## 2019-01-13 DIAGNOSIS — F0391 Unspecified dementia with behavioral disturbance: Secondary | ICD-10-CM | POA: Diagnosis not present

## 2019-01-13 DIAGNOSIS — J449 Chronic obstructive pulmonary disease, unspecified: Secondary | ICD-10-CM | POA: Diagnosis not present

## 2019-01-14 ENCOUNTER — Telehealth: Payer: Self-pay | Admitting: Neurology

## 2019-01-14 NOTE — Telephone Encounter (Signed)
Patient called regarding her dad and his in office appointment on 01/20/19 with Dr. Delice Lesch. She said he is in a Facility now and is having a hard time getting to appointments. Also, an employee tested positive for the COVID virus at the facility. His daughter is wanting to see if he can have a Virtual Visit instead of coming in? She said she would provide [place] with their Email info.  Please Advise. Thank you

## 2019-01-14 NOTE — Telephone Encounter (Addendum)
Called and spoke with Adam Savage regarding Adam Savage's current health. She states he had a recent 2 falls within 48 hours once on hard surface on concrete in the parking, as well as once in his room. She states she is primarily concerned with Adam Savage's repeated falls and concern for possible fracture and complaint of back pain. She states she had a chance to visit him at [facility] and he appeared significantly more distressed than prior especially with feeling like 'they don't take care of me here.' She states she also had conversation with his physical therapists who states that his ambulatory function is still intact and he doesn't have any symptoms of acute pathology. Had a prolonged discussion with Adam Savage regarding need to follow up with his neuro-psychiatrist as well as ortho for chronic back pain and possible exacerbation of mood disorder. Discussed possibility of increasing his pain medication for treatment only if he has an acute pathology as with heavier doses of opioid medication can worsen delirium and discussed having a prompt in-person appointment to evaluate him. Adam Savage expressed understanding.  Also attempted to call Adam Savage, Nursing manager at [facility] [phone]) to clarify but went to voice mail. Voice mail with call-back number left.   Adam Savage called back and stated that the two episodes of falls that he has had were mechanical in nature due to his body habitus and he had slipped off from the bed and not a traumatic fall as described by Adam Savage. Also mentions that he has not been complaining of significant exacerbation of his pain and also have had no difficulty with his range of motion or physical functioning when working with physical therapy. Discussed possibility that he may be over-exaggerating his symptoms in the presence of his daughter as he has had prior history of doing so. Adam Savage requests his medication script in paper form to Fax Number: [phone].  All other concerns addressed.

## 2019-01-14 NOTE — Telephone Encounter (Signed)
I"ll give her a call back today thank you

## 2019-01-15 ENCOUNTER — Telehealth: Payer: Self-pay | Admitting: Internal Medicine

## 2019-01-15 DIAGNOSIS — S32010S Wedge compression fracture of first lumbar vertebra, sequela: Secondary | ICD-10-CM

## 2019-01-15 MED ORDER — HYDROCODONE-ACETAMINOPHEN 5-325 MG PO TABS: 1.0000 | ORAL_TABLET | Freq: Two times a day (BID) | ORAL | 0 refills | Status: DC | PRN

## 2019-01-15 NOTE — Telephone Encounter (Signed)
Refill for pain medication sent to [facility] by paper fax.

## 2019-01-15 NOTE — Telephone Encounter (Signed)
Needs refill on pain medicine  ;pls contact Angeal at [place]   [place] [phone] fax: [phone]  [facility] faxed order on Monday

## 2019-01-18 NOTE — Telephone Encounter (Signed)
Patient's visit on 01/19/2019 has been changed to a virtual visit with Dr. Delice Lesch. If this is not okay, the appointment will need cancelled and rescheduled.  Patient is in assisted living, [facility] in [place], where [place] is spreading. He has tested negative. However, his daughter, Caryl Pina does not want him to visit the office at this time.

## 2019-01-19 ENCOUNTER — Encounter: Payer: Self-pay | Admitting: Neurology

## 2019-01-19 ENCOUNTER — Other Ambulatory Visit: Payer: Self-pay

## 2019-01-19 ENCOUNTER — Telehealth (INDEPENDENT_AMBULATORY_CARE_PROVIDER_SITE_OTHER): Payer: Medicare Other | Admitting: Neurology

## 2019-01-19 VITALS — Ht 72.0 in | Wt 298.4 lb

## 2019-01-19 DIAGNOSIS — F0281 Dementia in other diseases classified elsewhere with behavioral disturbance: Secondary | ICD-10-CM

## 2019-01-19 DIAGNOSIS — F02818 Dementia in other diseases classified elsewhere, unspecified severity, with other behavioral disturbance: Secondary | ICD-10-CM

## 2019-01-19 DIAGNOSIS — G40009 Localization-related (focal) (partial) idiopathic epilepsy and epileptic syndromes with seizures of localized onset, not intractable, without status epilepticus: Secondary | ICD-10-CM
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
## 2019-01-20 ENCOUNTER — Telehealth: Payer: Self-pay | Admitting: Pharmacist

## 2019-01-20 DIAGNOSIS — Z03818 Encounter for observation for suspected exposure to other biological agents ruled out: Secondary | ICD-10-CM | POA: Diagnosis not present

## 2019-01-20 NOTE — Telephone Encounter (Signed)
Received Hep C RNA from Janett Billow, Therapist, sports at [facility] assisted living facility.  It is undetectable. Patient will see Dr. Linus Salmons in December for Hep C follow up. He is taking Harvoni x 24 weeks for Hepatitis C with decompensated cirrhosis.

## 2019-01-21 ENCOUNTER — Encounter: Payer: Self-pay | Admitting: Orthopaedic Surgery

## 2019-01-21 ENCOUNTER — Other Ambulatory Visit: Payer: Self-pay

## 2019-01-21 ENCOUNTER — Ambulatory Visit (INDEPENDENT_AMBULATORY_CARE_PROVIDER_SITE_OTHER): Payer: Medicare Other | Admitting: Orthopaedic Surgery

## 2019-01-21 VITALS — BP 126/74 | HR 83 | Ht 72.0 in | Wt 298.0 lb

## 2019-01-21 DIAGNOSIS — G8929 Other chronic pain: Secondary | ICD-10-CM | POA: Diagnosis not present

## 2019-01-21 DIAGNOSIS — M545 Low back pain, unspecified: Secondary | ICD-10-CM

## 2019-01-21 DIAGNOSIS — M5431 Sciatica, right side: Secondary | ICD-10-CM

## 2019-01-21 NOTE — Progress Notes (Signed)
Patient [id number] Adam Savage, male DOB:07/04/1950, 68 y.o. [id number]  Chief Complaint  Patient presents with  . Back Pain    increased after fall x2 2 weeks ago     HPI  Adam Savage is a 68 y.o. male who has chronic lower back pain.  He had compression fracture of L1 in May.  I had set him up for MRI but he did not go.  He has fallen out of bed at the rest home and also has fallen in their driveway in the last week or so.  He uses a walker.  His pain is tolerable but he still has pain.  He has no weakness, no numbness.  I would like to get a MRI of the lumbar spine.   Body mass index is 40.42 kg/m.  ROS  Review of Systems  Constitutional: Positive for activity change.  HENT: Positive for hearing loss.   Respiratory: Positive for cough and shortness of breath.   Cardiovascular: Positive for chest pain, palpitations and leg swelling.  Musculoskeletal: Positive for arthralgias and back pain.  Neurological: Positive for seizures.    All other systems reviewed and are negative.  The following is a summary of the past history medically, past history surgically, known current medicines, social history and family history.  This information is gathered electronically by the computer from prior information and documentation.  I review this each visit and have found including this information at this point in the chart is beneficial and informative.    Past Medical History:  Diagnosis Date  . COPD (chronic obstructive pulmonary disease) ([place])   . Diabetes mellitus without complication ([place])   . H/O diverticulitis of colon 11/03/2012   History of partial colectomy in 2011.  . Mechanical heart valve present   . Overdose of muscle relaxant 08/11/2017  . Pre-diabetes   . Retroperitoneal hematoma 01/29/2018  . S/P AVR (aortic valve replacement)   . Seizures ([place])   . 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 CARDIAC SURGERY    .  CHOLECYSTECTOMY    . COLON SURGERY    . CORONARY/GRAFT ANGIOGRAPHY N/A 10/31/2017   Procedure: CORONARY/GRAFT ANGIOGRAPHY;  Surgeon: Nelva Bush, MD;  Location: [facility];  Service: Cardiovascular;  Laterality: N/A;  . ESOPHAGOGASTRODUODENOSCOPY (EGD) WITH PROPOFOL N/A 01/18/2018   Procedure: ESOPHAGOGASTRODUODENOSCOPY (EGD) WITH PROPOFOL;  Surgeon: Mauri Pole, MD;  Location: [facility];  Service: Endoscopy;  Laterality: N/A;  . JOINT REPLACEMENT    . KNEE SURGERY Left     Family History  Problem Relation Age of Onset  . Hypertension Father     Social History Social History   Tobacco Use  . Smoking status: Current Every Day Smoker    Packs/day: 1.00    Types: Cigarettes  . Smokeless tobacco: Never Used  Substance Use Topics  . Alcohol use: Not Currently  . Drug use: Never    Allergies  Allergen Reactions  . Morphine And Related Other (See Comments)    Combative     Current Outpatient Medications  Medication Sig Dispense Refill  . albuterol (VENTOLIN HFA) 108 (90 Base) MCG/ACT inhaler Inhale 2 puffs into the lungs every 4 (four) hours as needed for wheezing or shortness of breath. 18 g 2  . atorvastatin (LIPITOR) 10 MG tablet TAKE 1 TABLET BY MOUTH EVERY DAY 90 tablet 0  . carvedilol (COREG) 6.25 MG tablet Take 1 tablet (6.25 mg total) by mouth  2 (two) times daily with a meal. 90 tablet 3  . diazepam (VALIUM) 5 MG tablet Take about 45 minutes before MRI. 1 tablet 0  . docusate sodium (COLACE) 100 MG capsule Take 100 mg by mouth 2 (two) times daily.    Marland Kitchen escitalopram (LEXAPRO) 10 MG tablet Take 1 tablet (10 mg total) by mouth at bedtime. (Patient taking differently: Take 10 mg by mouth daily. ) 90 tablet 3  . furosemide (LASIX) 40 MG tablet     . glucose blood test strip Use as instructed 100 each 12  . HYDROcodone-acetaminophen (NORCO/VICODIN) 5-325 MG tablet Take 1-2 tablets by mouth every 12 (twelve) hours as needed for moderate pain. 60 tablet 0   . insulin aspart (NOVOLOG FLEXPEN) 100 UNIT/ML FlexPen INJECT 10 UNITS INTO THE SKIN 3 (THREE) TIMES DAILY WITH MEALS. ADJUST DOSE AS NEEDED 45 mL 1  . insulin detemir (LEVEMIR) 100 UNIT/ML injection Inject 0.28 mLs (28 Units total) into the skin at bedtime. Increase Levemir by 2 units when the week average is >200. 10 mL 3  . Insulin Pen Needle (ADVOCATE INSULIN PEN NEEDLES) 31G X 5 MM MISC 1 Package by Does not apply route 4 (four) times daily. 100 each 2  . lactulose (CHRONULAC) 10 GM/15ML solution Take 15 mLs (10 g total) by mouth daily. 473 mL 3  . Ledipasvir-Sofosbuvir (HARVONI) 90-400 MG TABS Take 1 tablet by mouth daily. 28 tablet 5  . levETIRAcetam (KEPPRA) 1000 MG tablet Take 1 tablet (1,000 mg total) by mouth 2 (two) times daily. 180 tablet 3  . memantine (NAMENDA) 5 MG tablet Take 1 tablet (5 mg total) by mouth 2 (two) times daily. 180 tablet 3  . pantoprazole (PROTONIX) 40 MG tablet Take 1 tablet (40 mg total) by mouth daily. 90 tablet 3  . QUEtiapine (SEROQUEL) 100 MG tablet TAKE HALF A TABLET IN THE MORNING AND 1.5 TABLETS IN THE EVENING 180 tablet 1  . warfarin (COUMADIN) 4 MG tablet Take 1 tablet (4 mg total) by mouth daily. (Patient taking differently: Take 4 mg by mouth daily. 2mg  on Tues and Fridays) 90 tablet 3  . spironolactone (ALDACTONE) 50 MG tablet Take 1 tablet (50 mg total) by mouth daily for 30 days. 30 tablet 2   No current facility-administered medications for this visit.      Physical Exam  Blood pressure 126/74, pulse 83, height 6' (1.829 m), weight 298 lb (135.2 kg).  Constitutional: overall normal hygiene, normal nutrition, well developed, normal grooming, normal body habitus. Assistive device:walker  Musculoskeletal: gait and station Limp none, muscle tone and strength are normal, no tremors or atrophy is present.  .  Neurological: coordination overall normal.  Deep tendon reflex/nerve stretch intact.  Sensation normal.  Cranial nerves II-XII intact.    Skin:   Normal overall no scars, lesions, ulcers or rashes. No psoriasis.  Psychiatric: Alert and oriented x 3.  Recent memory intact, remote memory unclear.  Normal mood and affect. Well groomed.  Good eye contact.  Cardiovascular: overall no swelling, no varicosities, no edema bilaterally, normal temperatures of the legs and arms, no clubbing, cyanosis and good capillary refill.  Spine/Pelvis examination:  Inspection:  Overall, sacoiliac joint benign and hips nontender; without crepitus or defects.   Thoracic spine inspection: Alignment normal without kyphosis present   Lumbar spine inspection:  Alignment  with normal lumbar lordosis, without scoliosis apparent.   Thoracic spine palpation:  without tenderness of spinal processes   Lumbar spine palpation: without tenderness of  lumbar area; without tightness of lumbar muscles    Range of Motion:   Lumbar flexion, forward flexion is normal without pain or tenderness    Lumbar extension is full without pain or tenderness   Left lateral bend is normal without pain or tenderness   Right lateral bend is normal without pain or tenderness   Straight leg raising is normal  Strength & tone: normal   Stability overall normal stability  Lymphatic: palpation is normal.  All other systems reviewed and are negative   The patient has been educated about the nature of the problem(s) and counseled on treatment options.  The patient appeared to understand what I have discussed and is in agreement with it.  Encounter Diagnoses  Name Primary?  . Back pain with right-sided sciatica Yes  . Chronic midline low back pain without sciatica     PLAN Call if any problems.  Precautions discussed.  Continue current medications.   Return to clinic 2 weeks   Get MRI of the lumbar spine.  Electronically Signed Sanjuana Kava, MD 10/8/20209:08 AM

## 2019-01-21 NOTE — Addendum Note (Signed)
Addended by: Derek Mound A on: 01/21/2019 10:06 AM   Modules accepted: Orders

## 2019-01-27 DIAGNOSIS — Z03818 Encounter for observation for suspected exposure to other biological agents ruled out: Secondary | ICD-10-CM | POA: Diagnosis not present

## 2019-01-29 ENCOUNTER — Other Ambulatory Visit: Payer: Self-pay | Admitting: *Deleted

## 2019-01-29 DIAGNOSIS — E119 Type 2 diabetes mellitus without complications: Secondary | ICD-10-CM

## 2019-01-29 MED ORDER — GLUCOSE BLOOD VI STRP: ORAL_STRIP | 12 refills | Status: DC

## 2019-01-29 NOTE — Telephone Encounter (Signed)
Call from [facility] requesting a refill on pt's test strips and sed to [facility] in [place]. Instructions "to test before meals and at bedtime" (not "used as instructed"). Thanks

## 2019-02-01 ENCOUNTER — Ambulatory Visit (INDEPENDENT_AMBULATORY_CARE_PROVIDER_SITE_OTHER): Payer: Medicare Other | Admitting: *Deleted

## 2019-02-01 DIAGNOSIS — Z952 Presence of prosthetic heart valve: Secondary | ICD-10-CM

## 2019-02-01 DIAGNOSIS — Z5181 Encounter for therapeutic drug level monitoring: Secondary | ICD-10-CM

## 2019-02-01 LAB — POCT INR: INR: 1.8 — AB (ref 2.0–3.0)

## 2019-02-01 NOTE — Patient Instructions (Signed)
Take warfarin 6mg  tonight then increase dose to 4mg  daily except 2mg  on Fridays. Moved to [facility] 7/30. Started Harvoni 11/19/18 (can decrease INR).  Recheck INR in 2 weeks. Order given to Kathlee Nations RN [facility] and faxed to [facility] [phone]

## 2019-02-02 ENCOUNTER — Encounter: Payer: Self-pay | Admitting: Internal Medicine

## 2019-02-02 ENCOUNTER — Ambulatory Visit (INDEPENDENT_AMBULATORY_CARE_PROVIDER_SITE_OTHER): Payer: Medicare Other | Admitting: Internal Medicine

## 2019-02-02 ENCOUNTER — Other Ambulatory Visit: Payer: Self-pay

## 2019-02-02 VITALS — BP 109/58 | HR 61 | Temp 98.6°F | Ht 72.0 in | Wt 302.5 lb

## 2019-02-02 DIAGNOSIS — E119 Type 2 diabetes mellitus without complications: Secondary | ICD-10-CM

## 2019-02-02 DIAGNOSIS — K7460 Unspecified cirrhosis of liver: Secondary | ICD-10-CM

## 2019-02-02 DIAGNOSIS — F02818 Dementia in other diseases classified elsewhere, unspecified severity, with other behavioral disturbance: Secondary | ICD-10-CM

## 2019-02-02 DIAGNOSIS — S32010S Wedge compression fracture of first lumbar vertebra, sequela: Secondary | ICD-10-CM | POA: Diagnosis not present

## 2019-02-02 DIAGNOSIS — I5030 Unspecified diastolic (congestive) heart failure: Secondary | ICD-10-CM | POA: Diagnosis not present

## 2019-02-02 DIAGNOSIS — B182 Chronic viral hepatitis C: Secondary | ICD-10-CM | POA: Diagnosis not present

## 2019-02-02 DIAGNOSIS — Z7901 Long term (current) use of anticoagulants: Secondary | ICD-10-CM | POA: Diagnosis not present

## 2019-02-02 DIAGNOSIS — I38 Endocarditis, valve unspecified: Secondary | ICD-10-CM | POA: Diagnosis not present

## 2019-02-02 DIAGNOSIS — F0281 Dementia in other diseases classified elsewhere with behavioral disturbance: Secondary | ICD-10-CM | POA: Diagnosis not present

## 2019-02-02 DIAGNOSIS — Z794 Long term (current) use of insulin: Secondary | ICD-10-CM | POA: Diagnosis not present

## 2019-02-02 LAB — POCT GLYCOSYLATED HEMOGLOBIN (HGB A1C): Hemoglobin A1C: 7.5 % — AB (ref 4.0–5.6)

## 2019-02-02 LAB — GLUCOSE, CAPILLARY: Glucose-Capillary: 116 mg/dL — ABNORMAL HIGH (ref 70–99)

## 2019-02-02 NOTE — Patient Instructions (Signed)
Dear Adam Savage,  Thank you for allowing [place] to provide your care today. Today we discussed your weight and back pain    I have ordered hemoglobin a1c, cmp and cbc labs for you. I will call if any are abnormal.    Today we made the following changes to your medications:    Please follow-up in 3 months.    Should you have any questions or concerns please call the internal medicine clinic at [phone].    Thank you for choosing [facility].   Chronic Back Pain When back pain lasts longer than 3 months, it is called chronic back pain. Pain may get worse at certain times (flare-ups). There are things you can do at home to manage your pain. Follow these instructions at home: Activity      Avoid bending and other activities that make pain worse.  When standing: ? Keep your upper back and neck straight. ? Keep your shoulders pulled back. ? Avoid slouching.  When sitting: ? Keep your back straight. ? Relax your shoulders. Do not round your shoulders or pull them backward.  Do not sit or stand in one place for long periods of time.  Take short rest breaks during the day. Lying down or standing is usually better than sitting. Resting can help relieve pain.  When sitting or lying down for a long time, do some mild activity or stretching. This will help to prevent stiffness and pain.  Get regular exercise. Ask your doctor what activities are safe for you.  Do not lift anything that is heavier than 10 lb (4.5 kg). To prevent injury when you lift things: ? Bend your knees. ? Keep the weight close to your body. ? Avoid twisting. Managing pain  If told, put ice on the painful area. Your doctor may tell you to use ice for 24-48 hours after a flare-up starts. ? Put ice in a plastic bag. ? Place a towel between your skin and the bag. ? Leave the ice on for 20 minutes, 2-3 times a day.  If told, put heat on the painful area as often as told by your doctor. Use the heat source  that your doctor recommends, such as a moist heat pack or a heating pad. ? Place a towel between your skin and the heat source. ? Leave the heat on for 20-30 minutes. ? Remove the heat if your skin turns bright red. This is especially important if you are unable to feel pain, heat, or cold. You may have a greater risk of getting burned.  Soak in a warm bath. This can help relieve pain.  Take over-the-counter and prescription medicines only as told by your doctor. General instructions  Sleep on a firm mattress. Try lying on your side with your knees slightly bent. If you lie on your back, put a pillow under your knees.  Keep all follow-up visits as told by your doctor. This is important. Contact a doctor if:  You have pain that does not get better with rest or medicine. Get help right away if:  One or both of your arms or legs feel weak.  One or both of your arms or legs lose feeling (numbness).  You have trouble controlling when you poop (bowel movement) or pee (urinate).  You feel sick to your stomach (nauseous).  You throw up (vomit).  You have belly (abdominal) pain.  You have shortness of breath.  You pass out (faint). Summary  When back pain lasts  longer than 3 months, it is called chronic back pain.  Pain may get worse at certain times (flare-ups).  Use ice and heat as told by your doctor. Your doctor may tell you to use ice after flare-ups. This information is not intended to replace advice given to you by your health care provider. Make sure you discuss any questions you have with your health care provider. Document Released: 09/18/2007 Document Revised: 07/23/2018 Document Reviewed: 11/14/2016 Elsevier Patient Education  2020 [employer].

## 2019-02-02 NOTE — Progress Notes (Signed)
CC: Back pain  HPI: [name] is a 68 y.o. 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  [name] presents from [place] with health coach.  Over the last 3 months he states he has been acclimating to his new living condition.  He has been more active with physical therapy and not staying in bed all day.  He mentions continued to have significant back pain and expresses frustration with lack of progress despite therapy.  Discussed recent visit with Dr. Luna Glasgow his orthopedist who recommended an MRI to follow-up his compression fracture.  He recounts prior attempts at getting an MRI were he was unable to tolerate due to claustrophobia.  He states he will attempt again so that he may receive more aggressive intervention for his back pain.  Discussed current plan to manage his pain so that he may participate fully with physical therapy.  Past Medical History:  Diagnosis Date   COPD (chronic obstructive pulmonary disease) ([place])    Diabetes mellitus without complication ([place])    H/O diverticulitis of colon 11/03/2012   History of partial colectomy in 2011.   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  Respiratory: Negative for shortness of breath.   Cardiovascular: Positive for leg swelling. Negative for chest pain and palpitations.  Gastrointestinal: Negative for constipation, diarrhea, nausea and vomiting.  Musculoskeletal: Positive for back pain, falls and joint pain.     Physical Exam: Vitals:     02/02/19 1507 02/02/19 1510  BP:  (!) 109/58  Pulse:  61  Temp:  98.6 F (37 C)  TempSrc:  Oral  SpO2:  98%  Weight: (!) 302 lb 8 oz (137.2 kg)   Height: 6' (1.829 m)    Physical Exam  Constitutional: He is oriented to person, place, and time. He appears well-developed. No distress.  Obese  Eyes: Conjunctivae are normal. No scleral icterus.  Neck: Normal range of motion. Neck supple. No JVD present.  Cardiovascular: Normal rate, regular rhythm, normal heart sounds and intact distal pulses.  No murmur heard. Respiratory: Effort normal and breath sounds normal. He has no rales.  GI: Soft. Bowel sounds are normal. He exhibits distension (Hepatomegaly). There is no abdominal tenderness.  No fluid shift  Musculoskeletal: Normal range of motion.        General: Edema (1+ Bilateral pitting edema) present.  Neurological: He is alert and oriented to person, place, and time.  Skin: Skin is warm and dry.  Psychiatric: He has a normal mood and affect. His behavior is normal.    Assessment & Plan:   Type II diabetes mellitus ([place]) Glucometer readings from [place] reviewed. Showed stable fasting glucose around 140-160s. Health coach from [place] mentions better adherence to low carb diet with reduction in family members bringing in outside snacks. Discussed importance of adherence. Denies any polyuria, polydipsia, polyphagia. No hypoglycemic events noted.  - C/w levemir 28 unit qhs, Novolog 12 units TID qc + sliding scale for readings >200 - Check hemoglobin a1c today. - due to age and comorbidity  goal hgb a1c <8 - Urine microalbumin - Order for lancets and glucometer strips sent to pharmacy as requested  Dementia associated with other underlying disease with behavioral disturbance [facility]) Had discussion with Mr.Akre's health coach regarding improvement in his mood and daily activities with physical therapy on board. Mentions that since last visit, he has been much more active and  participating in activities at [facility] and improvement in agitation and anxiety since admission to the facility. Encouraged Mr.Lohr to continue w/ his engagement. Mr.Nahar expressed understanding.  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 At our clinic scale, weight is 302 this visit, improved from 307 at last visit. Reviewed daily weight log from [facility] which shows stable weight. Does appear mildly hypervolemic on exam with lower extremity edema and abdominal distention. Mr.Mccalister denies any chest pain, palpitations, dyspnea or orthopnea.  - Check CMP this visit - Exam show slight hypervolemia and then slight weight gain up from 300lb baseline. Advised nursing staff at [facility] to increase furosemide up to 80mg  in am for 3-5 days and then resume 40mg  BID depending on daily weight - C/w furosemide 40mg  BID, carvedilol 6.25mg  BID  Hepatic cirrhosis due to chronic hepatitis C infection ([place]) Currently doing well on spironolactone and furosemide. No significant fluid shift on exam although distended. Hep C treated with Harvoni. Neg Hep C on repeat RNA test.  - Check CMP today - C/w spironolactone 50mg  daily, furosemide 40mg  BID  Compression fracture of L1 lumbar vertebra (HCC) Presents w/ acute on chronic low back pain. Continuing to be managed with Norco. Using intermittently based on exacerbation. Currently undergoing physical therapy with improvement in functioning. Mr.Vanroekel express frustration w/ lack of progress. Discussed that Dr.Keeling is still awaiting on MRI results to assess for possible intervention. Mr.Linares expressed understanding  - C/w PT - F/u Ortho   Patient discussed with Dr. Angelia Mould   -Gilberto Better, PGY2 [facility] Pager: [phone]

## 2019-02-03 ENCOUNTER — Other Ambulatory Visit: Payer: Self-pay | Admitting: *Deleted

## 2019-02-03 ENCOUNTER — Encounter: Payer: Self-pay | Admitting: Internal Medicine

## 2019-02-03 ENCOUNTER — Telehealth: Payer: Self-pay | Admitting: Internal Medicine

## 2019-02-03 DIAGNOSIS — Z794 Long term (current) use of insulin: Secondary | ICD-10-CM

## 2019-02-03 DIAGNOSIS — E119 Type 2 diabetes mellitus without complications: Secondary | ICD-10-CM

## 2019-02-03 LAB — CMP14 + ANION GAP
ALT: 17 IU/L (ref 0–44)
AST: 33 IU/L (ref 0–40)
Albumin/Globulin Ratio: 1 — ABNORMAL LOW (ref 1.2–2.2)
Albumin: 3.5 g/dL — ABNORMAL LOW (ref 3.8–4.8)
Alkaline Phosphatase: 172 IU/L — ABNORMAL HIGH (ref 39–117)
Anion Gap: 13 mmol/L (ref 10.0–18.0)
BUN/Creatinine Ratio: 15 (ref 10–24)
BUN: 29 mg/dL — ABNORMAL HIGH (ref 8–27)
Bilirubin Total: 0.6 mg/dL (ref 0.0–1.2)
CO2: 25 mmol/L (ref 20–29)
Calcium: 8.9 mg/dL (ref 8.6–10.2)
Chloride: 98 mmol/L (ref 96–106)
Creatinine, Ser: 1.93 mg/dL — ABNORMAL HIGH (ref 0.76–1.27)
GFR calc Af Amer: 40 mL/min/{1.73_m2} — ABNORMAL LOW (ref >59)
GFR calc non Af Amer: 35 mL/min/{1.73_m2} — ABNORMAL LOW (ref >59)
Globulin, Total: 3.6 g/dL (ref 1.5–4.5)
Glucose: 114 mg/dL — ABNORMAL HIGH (ref 65–99)
Potassium: 4.3 mmol/L (ref 3.5–5.2)
Sodium: 136 mmol/L (ref 134–144)
Total Protein: 7.1 g/dL (ref 6.0–8.5)

## 2019-02-03 LAB — CBC
Hematocrit: 37.4 % — ABNORMAL LOW (ref 37.5–51.0)
Hemoglobin: 12.2 g/dL — ABNORMAL LOW (ref 13.0–17.7)
MCH: 26.9 pg (ref 26.6–33.0)
MCHC: 32.6 g/dL (ref 31.5–35.7)
MCV: 82 fL (ref 79–97)
Platelets: 131 10*3/uL — ABNORMAL LOW (ref 150–450)
RBC: 4.54 x10E6/uL (ref 4.14–5.80)
RDW: 16.8 % — ABNORMAL HIGH (ref 11.6–15.4)
WBC: 8.3 10*3/uL (ref 3.4–10.8)

## 2019-02-03 LAB — MICROALBUMIN / CREATININE URINE RATIO
Creatinine, Urine: 178.3 mg/dL
Microalb/Creat Ratio: 3 mg/g creat (ref 0–29)
Microalbumin, Urine: 5.7 ug/mL

## 2019-02-03 MED ORDER — GLUCOSE BLOOD VI STRP: ORAL_STRIP | 12 refills | Status: DC

## 2019-02-03 MED ORDER — LANCETS MISC: 1.0000 | Freq: Three times a day (TID) | 3 refills | Status: DC

## 2019-02-03 MED ORDER — FUROSEMIDE 40 MG PO TABS: 40.0000 mg | ORAL_TABLET | Freq: Two times a day (BID) | ORAL | 3 refills | Status: DC

## 2019-02-03 MED ORDER — ADVOCATE INSULIN PEN NEEDLES 31G X 5 MM MISC: 1.0000 | Freq: Four times a day (QID) | 2 refills | Status: AC

## 2019-02-03 NOTE — Assessment & Plan Note (Signed)
Glucometer readings from [place] reviewed. Showed stable fasting glucose around 140-160s. Health coach from [place] mentions better adherence to low carb diet with reduction in family members bringing in outside snacks. Discussed importance of adherence. Denies any polyuria, polydipsia, polyphagia. No hypoglycemic events noted.  - C/w levemir 28 unit qhs, Novolog 12 units TID qc + sliding scale for readings >200 - Check hemoglobin a1c today. - due to age and comorbidity goal hgb a1c <8 - Urine microalbumin - Order for lancets and glucometer strips sent to pharmacy as requested

## 2019-02-03 NOTE — Assessment & Plan Note (Addendum)
Currently doing well on spironolactone and furosemide. No significant fluid shift on exam although distended. Hep C treated with Harvoni. Neg Hep C on repeat RNA test.  - Check CMP today - C/w spironolactone 50mg  daily, furosemide 40mg  BID

## 2019-02-03 NOTE — Assessment & Plan Note (Signed)
Presents w/ acute on chronic low back pain. Continuing to be managed with Norco. Using intermittently based on exacerbation. Currently undergoing physical therapy with improvement in functioning. [name] express frustration w/ lack of progress. Discussed that Dr.Keeling is still awaiting on MRI results to assess for possible intervention. [name] expressed understanding  - C/w PT - F/u Ortho

## 2019-02-03 NOTE — Telephone Encounter (Signed)
Discussed with Mr.Seefeld's daughter, [name], regarding yesterday's visit. Discussed how Mr.Spratlin's weight and diabetes has been stable and continued pain of his back that will require MRI. Discussed improvement in his functioning with physical therapy and plan to continue PT at [facility]. Ms.Ashley expressed understanding. All other concerns addressed.

## 2019-02-03 NOTE — Addendum Note (Signed)
Addended by: Ebbie Latus on: 02/03/2019 04:04 PM   Modules accepted: Orders

## 2019-02-03 NOTE — Assessment & Plan Note (Addendum)
Current weight stable at ~300lbs. At our clinic scale, weight is 302 this visit, improved from 307 at last visit. Reviewed daily weight log from [facility] which shows stable weight. Does appear mildly hypervolemic on exam with lower extremity edema and abdominal distention. Adam Savage denies any chest pain, palpitations, dyspnea or orthopnea.  - Check CMP this visit - Exam show slight hypervolemia and then slight weight gain up from 300lb baseline. Advised nursing staff at [facility] to increase furosemide up to 80mg  in am for 3-5 days and then resume 40mg  BID depending on daily weight - C/w furosemide 40mg  BID, carvedilol 6.25mg  BID

## 2019-02-03 NOTE — Assessment & Plan Note (Signed)
Had discussion with [name]'s health coach regarding improvement in his mood and daily activities with physical therapy on board. Mentions that since last visit, he has been much more active and participating in activities at [facility] and improvement in agitation and anxiety since admission to the facility. Encouraged [name] to continue w/ his engagement. [name] expressed understanding.  - C/w monitor

## 2019-02-04 ENCOUNTER — Telehealth: Payer: Self-pay | Admitting: Pharmacy Technician

## 2019-02-04 NOTE — Telephone Encounter (Addendum)
[facility] Patient Advocate Encounter  Sharyn Lull from [place] called this morning to check on status of patient's medication duration. The patient had a primary care provider appointment and they told him the labs said he was cured and to stop taking the medication for Hepatitis C. I confirmed this information with Dr. Linus Salmons and the patient is to continue taking his medication for the originally prescribed 24 weeks. I called back and spoke to Janett Billow to inform her that the patient is to continue course of treatment until the end of 24 weeks, January 2021. She agreed and made a note. Patient currently has 6 tablets remaining and set to receive a shipment later this week.  The pharmacy at [facility] sent a message that there is a lapse in prescription coverage on his insurance and there is a delay in filling the medication today. His daughter Caryl Pina is currently contacting the insurance company to find out why. Caryl Pina called back and stated their system is currently down for 24 hours but she is keeping an active check on it to make sure he gets the medication in time. She found out that the payment was not auto drafted and would lapse a month. We were able to provide a month coverage of medication for uninsured patient. The next refill will be mailed via ups from [place] to the SNF on 03/10/2019. Patient will not miss any doses.  Able to speaks to Josephine or Sharyn Lull at [place]

## 2019-02-06 ENCOUNTER — Other Ambulatory Visit: Payer: Self-pay | Admitting: Internal Medicine

## 2019-02-06 DIAGNOSIS — I38 Endocarditis, valve unspecified: Secondary | ICD-10-CM

## 2019-02-06 DIAGNOSIS — I5030 Unspecified diastolic (congestive) heart failure: Secondary | ICD-10-CM

## 2019-02-07 NOTE — Progress Notes (Signed)
Internal Medicine Clinic Attending ° °Case discussed with Dr. Lee at the time of the visit.  We reviewed the resident’s history and exam and pertinent patient test results.  I agree with the assessment, diagnosis, and plan of care documented in the resident’s note.  °

## 2019-02-08 ENCOUNTER — Telehealth: Payer: Self-pay | Admitting: *Deleted

## 2019-02-08 NOTE — Telephone Encounter (Signed)
Call to [facility] at [phone] for PA for One Touch Test Strips.  Spoke with representative. Will fax over information to be completed by Dr. Truman Hayward.  Sander Nephew, RN 02/08/2019 4:26 PM.

## 2019-02-09 ENCOUNTER — Ambulatory Visit: Payer: Medicare Other | Admitting: Orthopaedic Surgery

## 2019-02-12 ENCOUNTER — Encounter (INDEPENDENT_AMBULATORY_CARE_PROVIDER_SITE_OTHER): Payer: Self-pay | Admitting: Family Medicine

## 2019-02-12 DIAGNOSIS — Z87891 Personal history of nicotine dependence: Secondary | ICD-10-CM | POA: Diagnosis not present

## 2019-02-12 DIAGNOSIS — Z794 Long term (current) use of insulin: Secondary | ICD-10-CM | POA: Diagnosis not present

## 2019-02-12 DIAGNOSIS — I69311 Memory deficit following cerebral infarction: Secondary | ICD-10-CM | POA: Diagnosis not present

## 2019-02-12 DIAGNOSIS — Z03818 Encounter for observation for suspected exposure to other biological agents ruled out: Secondary | ICD-10-CM | POA: Diagnosis not present

## 2019-02-12 DIAGNOSIS — M6281 Muscle weakness (generalized): Secondary | ICD-10-CM | POA: Diagnosis not present

## 2019-02-12 DIAGNOSIS — E119 Type 2 diabetes mellitus without complications: Secondary | ICD-10-CM | POA: Diagnosis not present

## 2019-02-12 DIAGNOSIS — I5032 Chronic diastolic (congestive) heart failure: Secondary | ICD-10-CM | POA: Diagnosis not present

## 2019-02-12 DIAGNOSIS — I69398 Other sequelae of cerebral infarction: Secondary | ICD-10-CM | POA: Diagnosis not present

## 2019-02-12 DIAGNOSIS — Z7901 Long term (current) use of anticoagulants: Secondary | ICD-10-CM | POA: Diagnosis not present

## 2019-02-12 DIAGNOSIS — F0391 Unspecified dementia with behavioral disturbance: Secondary | ICD-10-CM | POA: Diagnosis not present

## 2019-02-12 DIAGNOSIS — R2681 Unsteadiness on feet: Secondary | ICD-10-CM | POA: Diagnosis not present

## 2019-02-12 DIAGNOSIS — I110 Hypertensive heart disease with heart failure: Secondary | ICD-10-CM | POA: Diagnosis not present

## 2019-02-12 DIAGNOSIS — J449 Chronic obstructive pulmonary disease, unspecified: Secondary | ICD-10-CM | POA: Diagnosis not present

## 2019-02-15 ENCOUNTER — Ambulatory Visit
Admission: RE | Admit: 2019-02-15 | Discharge: 2019-02-15 | Disposition: A | Discharge status: Home / Self Care | Payer: Medicare Other | Source: Ambulatory Visit | Attending: Orthopaedic Surgery | Admitting: Orthopaedic Surgery

## 2019-02-15 ENCOUNTER — Other Ambulatory Visit: Payer: Self-pay

## 2019-02-15 DIAGNOSIS — M545 Low back pain, unspecified: Secondary | ICD-10-CM

## 2019-02-15 DIAGNOSIS — M48061 Spinal stenosis, lumbar region without neurogenic claudication: Secondary | ICD-10-CM | POA: Diagnosis not present

## 2019-02-15 DIAGNOSIS — G8929 Other chronic pain: Secondary | ICD-10-CM

## 2019-02-17 DIAGNOSIS — Z03818 Encounter for observation for suspected exposure to other biological agents ruled out: Secondary | ICD-10-CM | POA: Diagnosis not present

## 2019-02-18 ENCOUNTER — Ambulatory Visit (INDEPENDENT_AMBULATORY_CARE_PROVIDER_SITE_OTHER): Payer: Medicare Other | Admitting: *Deleted

## 2019-02-18 ENCOUNTER — Ambulatory Visit: Payer: Medicare Other | Admitting: Orthopaedic Surgery

## 2019-02-18 ENCOUNTER — Telehealth: Payer: Self-pay | Admitting: *Deleted

## 2019-02-18 DIAGNOSIS — I5032 Chronic diastolic (congestive) heart failure: Secondary | ICD-10-CM | POA: Diagnosis not present

## 2019-02-18 DIAGNOSIS — R2681 Unsteadiness on feet: Secondary | ICD-10-CM | POA: Diagnosis not present

## 2019-02-18 DIAGNOSIS — Z952 Presence of prosthetic heart valve: Secondary | ICD-10-CM

## 2019-02-18 DIAGNOSIS — Z5181 Encounter for therapeutic drug level monitoring: Secondary | ICD-10-CM

## 2019-02-18 DIAGNOSIS — M6281 Muscle weakness (generalized): Secondary | ICD-10-CM | POA: Diagnosis not present

## 2019-02-18 DIAGNOSIS — I110 Hypertensive heart disease with heart failure: Secondary | ICD-10-CM | POA: Diagnosis not present

## 2019-02-18 DIAGNOSIS — I69398 Other sequelae of cerebral infarction: Secondary | ICD-10-CM | POA: Diagnosis not present

## 2019-02-18 DIAGNOSIS — I69311 Memory deficit following cerebral infarction: Secondary | ICD-10-CM | POA: Diagnosis not present

## 2019-02-18 LAB — POCT INR: INR: 2.3 (ref 2.0–3.0)

## 2019-02-18 NOTE — Telephone Encounter (Signed)
INR 2.3

## 2019-02-18 NOTE — Patient Instructions (Signed)
Continue warfarin 4mg  daily except 2mg  on Fridays. Moved to [facility] 7/30. Started Harvoni 11/19/18 (can decrease INR).  Recheck INR in 2 weeks. Order given to Kathlee Nations RN [facility] and faxed to [place] [phone]

## 2019-02-18 NOTE — Telephone Encounter (Signed)
Done.  See coumadin note. 

## 2019-02-23 DIAGNOSIS — E1051 Type 1 diabetes mellitus with diabetic peripheral angiopathy without gangrene: Secondary | ICD-10-CM | POA: Diagnosis not present

## 2019-02-23 DIAGNOSIS — L84 Corns and callosities: Secondary | ICD-10-CM | POA: Diagnosis not present

## 2019-02-24 ENCOUNTER — Telehealth: Payer: Self-pay | Admitting: *Deleted

## 2019-02-24 DIAGNOSIS — Z03818 Encounter for observation for suspected exposure to other biological agents ruled out: Secondary | ICD-10-CM | POA: Diagnosis not present

## 2019-02-24 NOTE — Telephone Encounter (Signed)
Per dr Daryll Drown, rtc to [place], they checked cbg while triage awaited on ph, cbg 304, pt doing well asymptomatic, states pt was visited by daughter today and opened window, she gave him a treat and they just now found out. She is reminded to use sliding scale and to recheck cbg's if there is questions, issues that arise to contact facility physician or call IM resident on call or have ems transport to ED. She is agreeable

## 2019-02-24 NOTE — Telephone Encounter (Signed)
Sounds reasonable. Thank you. 

## 2019-02-24 NOTE — Telephone Encounter (Signed)
Discussed with nursing, Adam Savage.   Would request a follow up CBG after insulin.  If not < 200, would give SSI (based on review of last clinic note, he is supposed to have orders for sliding scale insulin based on glucose).  If insulin ineffective or any further symptoms, consider evaluation with EMS.    Gilles Chiquito, MD

## 2019-02-24 NOTE — Telephone Encounter (Signed)
Facility calls and states pt cbg is 582, he has had his 12 units of novolog but needs additional orders. Please advise asap Jessica [phone]

## 2019-02-25 NOTE — Telephone Encounter (Addendum)
Spoke with Sharyn Lull at [facility]. Mentions that blood sugar is down to 120s this morning. Discussed importance of enforcing low carb diet. No other complaints.

## 2019-03-02 ENCOUNTER — Other Ambulatory Visit: Payer: Self-pay

## 2019-03-02 ENCOUNTER — Ambulatory Visit (INDEPENDENT_AMBULATORY_CARE_PROVIDER_SITE_OTHER): Payer: Medicare Other | Admitting: Orthopaedic Surgery

## 2019-03-02 ENCOUNTER — Encounter: Payer: Self-pay | Admitting: Orthopaedic Surgery

## 2019-03-02 VITALS — BP 108/62 | HR 66 | Ht 72.0 in | Wt 298.0 lb

## 2019-03-02 DIAGNOSIS — M5431 Sciatica, right side: Secondary | ICD-10-CM

## 2019-03-02 NOTE — Progress Notes (Signed)
Patient Adam Savage, male DOB:01-Nov-1950, 68 y.o. [id number]  Chief Complaint  Patient presents with  . Back Pain    HPI  Adam Savage is a 68 y.o. male who has lower back pain.  He had MRI which showed: IMPRESSION: 1. 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 2. Otherwise unchanged mild multilevel lumbar spondylosis as described above. 3. Chronic L1 and L3 compression deformities. No acute osseous abnormality.  I have explained the findings to him.  I would like for him to see neurosurgeon.  He is agreeable.  He continues to hurt with ambulation with walker.  Body mass index is 40.42 kg/m.  ROS  Review of Systems  Constitutional: Positive for activity change.  HENT: Positive for hearing loss.   Respiratory: Positive for cough and shortness of breath.   Cardiovascular: Positive for chest pain, palpitations and leg swelling.  Musculoskeletal: Positive for arthralgias and back pain.  Neurological: Positive for seizures.    All other systems reviewed and are negative.  The following is a summary of the past history medically, past history surgically, known current medicines, social history and family history.  This information is gathered electronically by the computer from prior information and documentation.  I review this each visit and have found including this information at this point in the chart is beneficial and informative.    Past Medical History:  Diagnosis Date  . COPD (chronic obstructive pulmonary disease) ([place])   . Diabetes mellitus without complication ([place])   . H/O diverticulitis of colon 11/03/2012   History of partial colectomy in 2011.  . Mechanical heart valve present   . Overdose of muscle relaxant 08/11/2017  . Pre-diabetes   . Retroperitoneal hematoma 01/29/2018  . S/P AVR (aortic valve replacement)   .  Seizures ([place])   . 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 CARDIAC SURGERY    . CHOLECYSTECTOMY    . COLON SURGERY    . CORONARY/GRAFT ANGIOGRAPHY N/A 10/31/2017   Procedure: CORONARY/GRAFT ANGIOGRAPHY;  Surgeon: Nelva Bush, MD;  Location: [place] [facility];  Service: Cardiovascular;  Laterality: N/A;  . ESOPHAGOGASTRODUODENOSCOPY (EGD) WITH PROPOFOL N/A 01/18/2018   Procedure: ESOPHAGOGASTRODUODENOSCOPY (EGD) WITH PROPOFOL;  Surgeon: Mauri Pole, MD;  Location: [facility];  Service: Endoscopy;  Laterality: N/A;  . JOINT REPLACEMENT    . KNEE SURGERY Left     Family History  Problem Relation Age of Onset  . Hypertension Father     Social History Social History   Tobacco Use  . Smoking status: Former Smoker    Packs/day: 1.00    Types: Cigarettes  . Smokeless tobacco: Never Used  Substance Use Topics  . Alcohol use: Not Currently  . Drug use: Never    Allergies  Allergen Reactions  . Ativan [Lorazepam]   . Morphine And Related Other (See Comments)    Combative     Current Outpatient Medications  Medication Sig Dispense Refill  . albuterol (VENTOLIN HFA) 108 (90 Base) MCG/ACT inhaler Inhale 2 puffs into the lungs every 4 (four) hours as needed for wheezing or shortness of breath. 18 g 2  . atorvastatin (LIPITOR) 10 MG tablet TAKE 1 TABLET BY MOUTH EVERY DAY 90 tablet 0  . carvedilol (COREG) 6.25 MG tablet TAKE 1 TABLET (6.25 MG TOTAL) BY  MOUTH 2 (TWO) TIMES DAILY WITH A MEAL. 180 tablet 1  . diazepam (VALIUM) 5 MG tablet Take about 45 minutes before MRI. 1 tablet 0  . docusate sodium (COLACE) 100 MG capsule Take 100 mg by mouth 2 (two) times daily.    Marland Kitchen escitalopram (LEXAPRO) 10 MG tablet Take 1 tablet (10 mg total) by mouth at bedtime. (Patient taking differently: Take 10 mg by mouth daily. ) 90 tablet 3  . furosemide (LASIX) 40 MG tablet Take 1 tablet (40 mg total) by mouth 2 (two) times daily.  Please take 80mg  am, 40mg  in pm for 5 days and then resume 40mg  BID 90 tablet 3  . glucose blood test strip Test blood sugar TID qAC and qHS 100 each 12  . HYDROcodone-acetaminophen (NORCO/VICODIN) 5-325 MG tablet Take 1-2 tablets by mouth every 12 (twelve) hours as needed for moderate pain. 60 tablet 0  . insulin aspart (NOVOLOG FLEXPEN) 100 UNIT/ML FlexPen INJECT 10 UNITS INTO THE SKIN 3 (THREE) TIMES DAILY WITH MEALS. ADJUST DOSE AS NEEDED 45 mL 1  . insulin detemir (LEVEMIR) 100 UNIT/ML injection Inject 0.28 mLs (28 Units total) into the skin at bedtime. Increase Levemir by 2 units when the week average is >200. 10 mL 3  . Insulin Pen Needle (ADVOCATE INSULIN PEN NEEDLES) 31G X 5 MM MISC 1 Package by Does not apply route 4 (four) times daily. 100 each 2  . lactulose (CHRONULAC) 10 GM/15ML solution Take 15 mLs (10 g total) by mouth daily. 473 mL 3  . Lancets MISC 1 Stick by Does not apply route 4 (four) times daily -  with meals and at bedtime. 200 each 3  . Ledipasvir-Sofosbuvir (HARVONI) 90-400 MG TABS Take 1 tablet by mouth daily. 28 tablet 5  . levETIRAcetam (KEPPRA) 1000 MG tablet Take 1 tablet (1,000 mg total) by mouth 2 (two) times daily. 180 tablet 3  . memantine (NAMENDA) 5 MG tablet Take 1 tablet (5 mg total) by mouth 2 (two) times daily. 180 tablet 3  . pantoprazole (PROTONIX) 40 MG tablet Take 1 tablet (40 mg total) by mouth daily. 90 tablet 3  . QUEtiapine (SEROQUEL) 100 MG tablet TAKE HALF A TABLET IN THE MORNING AND 1.5 TABLETS IN THE EVENING 180 tablet 1  . spironolactone (ALDACTONE) 50 MG tablet Take 1 tablet (50 mg total) by mouth daily for 30 days. 30 tablet 2  . warfarin (COUMADIN) 4 MG tablet Take 1 tablet (4 mg total) by mouth daily. (Patient taking differently: Take 4 mg by mouth daily. 2mg  on Tues and Fridays) 90 tablet 3   No current facility-administered medications for this visit.      Physical Exam  Blood pressure 108/62, pulse 66, height 6' (1.829 m), weight 298  lb (135.2 kg).  Constitutional: overall normal hygiene, normal nutrition, well developed, normal grooming, normal body habitus. Assistive device:walker  Musculoskeletal: gait and station Limp right, muscle tone and strength are normal, no tremors or atrophy is present.  .  Neurological: coordination overall normal.  Deep tendon reflex/nerve stretch intact.  Sensation normal.  Cranial nerves II-XII intact.   Skin:   Normal overall no scars, lesions, ulcers or rashes. No psoriasis.  Psychiatric: Alert and oriented x 3.  Recent memory intact, remote memory unclear.  Normal mood and affect. Well groomed.  Good eye contact.  Cardiovascular: overall no swelling, no varicosities, no edema bilaterally, normal temperatures of the legs and arms, no clubbing, cyanosis and good capillary refill.  Spine/Pelvis examination:  Inspection:  Overall, sacoiliac joint benign and hips nontender; without crepitus or defects.   Thoracic spine inspection: Alignment normal without kyphosis present   Lumbar spine inspection:  Alignment  with normal lumbar lordosis, without scoliosis apparent.   Thoracic spine palpation:  without tenderness of spinal processes   Lumbar spine palpation: without tenderness of lumbar area; without tightness of lumbar muscles    Range of Motion:   Lumbar flexion, forward flexion is normal without pain or tenderness    Lumbar extension is full without pain or tenderness   Left lateral bend is normal without pain or tenderness   Right lateral bend is normal without pain or tenderness   Straight leg raising is normal  Strength & tone: normal   Stability overall normal stability  Lymphatic: palpation is normal.  All other systems reviewed and are negative   The patient has been educated about the nature of the problem(s) and counseled on treatment options.  The patient appeared to understand what I have discussed and is in agreement with it.  Encounter Diagnosis  Name  Primary?  . Back pain with right-sided sciatica Yes    PLAN Call if any problems.  Precautions discussed.  Continue current medications. Forms completed for rest home.  Return to clinic to neurosurgeon   Electronically Signed Sanjuana Kava, MD 11/17/20202:35 PM

## 2019-03-04 ENCOUNTER — Ambulatory Visit (INDEPENDENT_AMBULATORY_CARE_PROVIDER_SITE_OTHER): Payer: Medicare Other | Admitting: *Deleted

## 2019-03-04 DIAGNOSIS — Z952 Presence of prosthetic heart valve: Secondary | ICD-10-CM | POA: Diagnosis not present

## 2019-03-04 DIAGNOSIS — M6281 Muscle weakness (generalized): Secondary | ICD-10-CM | POA: Diagnosis not present

## 2019-03-04 DIAGNOSIS — Z5181 Encounter for therapeutic drug level monitoring: Secondary | ICD-10-CM

## 2019-03-04 DIAGNOSIS — I69398 Other sequelae of cerebral infarction: Secondary | ICD-10-CM | POA: Diagnosis not present

## 2019-03-04 DIAGNOSIS — I5032 Chronic diastolic (congestive) heart failure: Secondary | ICD-10-CM | POA: Diagnosis not present

## 2019-03-04 DIAGNOSIS — I110 Hypertensive heart disease with heart failure: Secondary | ICD-10-CM | POA: Diagnosis not present

## 2019-03-04 DIAGNOSIS — R2681 Unsteadiness on feet: Secondary | ICD-10-CM | POA: Diagnosis not present

## 2019-03-04 DIAGNOSIS — I69311 Memory deficit following cerebral infarction: Secondary | ICD-10-CM | POA: Diagnosis not present

## 2019-03-04 LAB — POCT INR: INR: 2.3 (ref 2.0–3.0)

## 2019-03-04 NOTE — Patient Instructions (Signed)
Continue warfarin 4mg  daily except 2mg  on Fridays. Moved to [facility] 7/30. Started Harvoni 11/19/18 (can decrease INR).  Recheck INR in 2 weeks. Order given to Kathlee Nations RN [facility] and faxed to [place] [phone]

## 2019-03-08 DIAGNOSIS — Z6841 Body Mass Index (BMI) 40.0 and over, adult: Secondary | ICD-10-CM | POA: Diagnosis not present

## 2019-03-08 DIAGNOSIS — M7138 Other bursal cyst, other site: Secondary | ICD-10-CM | POA: Diagnosis not present

## 2019-03-09 MED FILL — HARVONI 90-400 MG TABLET: 90-400 | 28 days supply | Qty: 28 | Fill #3

## 2019-03-14 DIAGNOSIS — R2681 Unsteadiness on feet: Secondary | ICD-10-CM | POA: Diagnosis not present

## 2019-03-14 DIAGNOSIS — Z87891 Personal history of nicotine dependence: Secondary | ICD-10-CM | POA: Diagnosis not present

## 2019-03-14 DIAGNOSIS — I69398 Other sequelae of cerebral infarction: Secondary | ICD-10-CM | POA: Diagnosis not present

## 2019-03-14 DIAGNOSIS — Z7901 Long term (current) use of anticoagulants: Secondary | ICD-10-CM | POA: Diagnosis not present

## 2019-03-14 DIAGNOSIS — M6281 Muscle weakness (generalized): Secondary | ICD-10-CM | POA: Diagnosis not present

## 2019-03-14 DIAGNOSIS — E119 Type 2 diabetes mellitus without complications: Secondary | ICD-10-CM | POA: Diagnosis not present

## 2019-03-14 DIAGNOSIS — F0391 Unspecified dementia with behavioral disturbance: Secondary | ICD-10-CM | POA: Diagnosis not present

## 2019-03-14 DIAGNOSIS — I69311 Memory deficit following cerebral infarction: Secondary | ICD-10-CM | POA: Diagnosis not present

## 2019-03-14 DIAGNOSIS — I5032 Chronic diastolic (congestive) heart failure: Secondary | ICD-10-CM | POA: Diagnosis not present

## 2019-03-14 DIAGNOSIS — I110 Hypertensive heart disease with heart failure: Secondary | ICD-10-CM | POA: Diagnosis not present

## 2019-03-14 DIAGNOSIS — J449 Chronic obstructive pulmonary disease, unspecified: Secondary | ICD-10-CM | POA: Diagnosis not present

## 2019-03-14 DIAGNOSIS — Z794 Long term (current) use of insulin: Secondary | ICD-10-CM | POA: Diagnosis not present

## 2019-03-15 ENCOUNTER — Ambulatory Visit (INDEPENDENT_AMBULATORY_CARE_PROVIDER_SITE_OTHER): Payer: Medicare Other | Admitting: *Deleted

## 2019-03-15 DIAGNOSIS — Z952 Presence of prosthetic heart valve: Secondary | ICD-10-CM | POA: Diagnosis not present

## 2019-03-15 DIAGNOSIS — I110 Hypertensive heart disease with heart failure: Secondary | ICD-10-CM | POA: Diagnosis not present

## 2019-03-15 DIAGNOSIS — I69311 Memory deficit following cerebral infarction: Secondary | ICD-10-CM | POA: Diagnosis not present

## 2019-03-15 DIAGNOSIS — M6281 Muscle weakness (generalized): Secondary | ICD-10-CM | POA: Diagnosis not present

## 2019-03-15 DIAGNOSIS — Z5181 Encounter for therapeutic drug level monitoring: Secondary | ICD-10-CM | POA: Diagnosis not present

## 2019-03-15 DIAGNOSIS — I5032 Chronic diastolic (congestive) heart failure: Secondary | ICD-10-CM | POA: Diagnosis not present

## 2019-03-15 DIAGNOSIS — R2681 Unsteadiness on feet: Secondary | ICD-10-CM | POA: Diagnosis not present

## 2019-03-15 DIAGNOSIS — I69398 Other sequelae of cerebral infarction: Secondary | ICD-10-CM | POA: Diagnosis not present

## 2019-03-15 LAB — POCT INR: INR: 3 (ref 2.0–3.0)

## 2019-03-15 NOTE — Patient Instructions (Signed)
Continue warfarin 4mg  daily except 2mg  on Fridays. Moved to [facility] 7/30. Started Harvoni 11/19/18 (can decrease INR).  Recheck INR in 2 weeks. Order given to [place] RN [facility] and faxed to [place]

## 2019-03-17 ENCOUNTER — Other Ambulatory Visit: Payer: Self-pay

## 2019-03-17 ENCOUNTER — Ambulatory Visit (INDEPENDENT_AMBULATORY_CARE_PROVIDER_SITE_OTHER): Payer: Medicare Other | Admitting: Internal Medicine

## 2019-03-17 ENCOUNTER — Encounter: Payer: Self-pay | Admitting: Internal Medicine

## 2019-03-17 VITALS — BP 95/60 | HR 57 | Temp 97.6°F | Ht 72.0 in | Wt 299.0 lb

## 2019-03-17 DIAGNOSIS — Z23 Encounter for immunization: Secondary | ICD-10-CM | POA: Insufficient documentation

## 2019-03-17 DIAGNOSIS — K7460 Unspecified cirrhosis of liver: Secondary | ICD-10-CM | POA: Diagnosis not present

## 2019-03-17 DIAGNOSIS — B182 Chronic viral hepatitis C: Secondary | ICD-10-CM | POA: Diagnosis not present

## 2019-03-17 NOTE — Progress Notes (Signed)
   Subjective:    Patient ID: Adam Savage, male    DOB: 1950/07/05, 68 y.o.   MRN: [id number]  HPI Here for follow up of chronic hepatitis C Continues on harvoni for 24 weeks for genotype 1a hepatitis C in the setting of cirrhosis   He continues to live at a NH.  Here with a caregiver.  Getting the hepatitis B series.     Review of Systems  Constitutional: Negative for fatigue.  Gastrointestinal: Negative for diarrhea and nausea.  Skin: Negative for rash.       Objective:   Physical Exam Constitutional:      Appearance: Normal appearance.  Eyes:     General: No scleral icterus. Cardiovascular:     Rate and Rhythm: Normal rate and regular rhythm.     Heart sounds: No murmur.  Pulmonary:     Effort: Pulmonary effort is normal.  Neurological:     Mental Status: He is alert.  Psychiatric:        Mood and Affect: Mood normal.   SH: no alcohol        Assessment & Plan:

## 2019-03-17 NOTE — Assessment & Plan Note (Signed)
Doing well on treatment.  Continuing through January. He will follow up in February for EOT lab

## 2019-03-17 NOTE — Assessment & Plan Note (Signed)
Discussed hepatitis B and #2 of 2 given today

## 2019-03-17 NOTE — Assessment & Plan Note (Signed)
Will schedule him for a repeat [place] screening I will refer him to GI (has seen [facility] previously).  I am not sure if he is due for an EGD or not.  Had one due to a GI bleed in 2019.

## 2019-03-19 ENCOUNTER — Other Ambulatory Visit: Payer: Self-pay | Admitting: *Deleted

## 2019-03-19 DIAGNOSIS — E119 Type 2 diabetes mellitus without complications: Secondary | ICD-10-CM

## 2019-03-19 DIAGNOSIS — Z794 Long term (current) use of insulin: Secondary | ICD-10-CM

## 2019-03-19 MED ORDER — NOVOLOG FLEXPEN 100 UNIT/ML ~~LOC~~ SOPN: PEN_INJECTOR | SUBCUTANEOUS | 1 refills | Status: DC

## 2019-03-22 LAB — COMPLETE METABOLIC PANEL WITH GFR
AG Ratio: 0.9 (calc) — ABNORMAL LOW (ref 1.0–2.5)
ALT: 16 U/L (ref 9–46)
AST: 30 U/L (ref 10–35)
Albumin: 3.4 g/dL — ABNORMAL LOW (ref 3.6–5.1)
Alkaline phosphatase (APISO): 157 U/L — ABNORMAL HIGH (ref 35–144)
BUN/Creatinine Ratio: 15 (calc) (ref 6–22)
BUN: 26 mg/dL — ABNORMAL HIGH (ref 7–25)
CO2: 28 mmol/L (ref 20–32)
Calcium: 8.7 mg/dL (ref 8.6–10.3)
Chloride: 99 mmol/L (ref 98–110)
Creat: 1.77 mg/dL — ABNORMAL HIGH (ref 0.70–1.25)
GFR, Est African American: 45 mL/min/{1.73_m2} — ABNORMAL LOW (ref >=60)
GFR, Est Non African American: 39 mL/min/{1.73_m2} — ABNORMAL LOW (ref >=60)
Globulin: 3.6 g/dL (calc) (ref 1.9–3.7)
Glucose, Bld: 146 mg/dL — ABNORMAL HIGH (ref 65–99)
Potassium: 4 mmol/L (ref 3.5–5.3)
Sodium: 137 mmol/L (ref 135–146)
Total Bilirubin: 0.7 mg/dL (ref 0.2–1.2)
Total Protein: 7 g/dL (ref 6.1–8.1)

## 2019-03-22 LAB — HEPATITIS C RNA QUANTITATIVE
HCV Quantitative Log: <1.18 Log IU/mL
HCV RNA, PCR, QN: <15 IU/mL

## 2019-03-25 ENCOUNTER — Other Ambulatory Visit: Payer: Self-pay | Admitting: *Deleted

## 2019-03-25 DIAGNOSIS — S32010S Wedge compression fracture of first lumbar vertebra, sequela: Secondary | ICD-10-CM

## 2019-03-26 ENCOUNTER — Other Ambulatory Visit: Payer: Medicare Other

## 2019-03-26 ENCOUNTER — Encounter: Payer: Self-pay | Admitting: Physician Assistant

## 2019-03-26 ENCOUNTER — Ambulatory Visit (INDEPENDENT_AMBULATORY_CARE_PROVIDER_SITE_OTHER): Payer: Medicare Other | Admitting: Physician Assistant

## 2019-03-26 VITALS — BP 132/70 | HR 70 | Temp 98.4°F | Ht 71.0 in | Wt 302.0 lb

## 2019-03-26 DIAGNOSIS — B182 Chronic viral hepatitis C: Secondary | ICD-10-CM

## 2019-03-26 DIAGNOSIS — K7460 Unspecified cirrhosis of liver: Secondary | ICD-10-CM | POA: Diagnosis not present

## 2019-03-26 MED ORDER — HYDROCODONE-ACETAMINOPHEN 5-325 MG PO TABS: 1.0000 | ORAL_TABLET | Freq: Two times a day (BID) | ORAL | 0 refills | Status: DC | PRN

## 2019-03-26 NOTE — Addendum Note (Signed)
Addended by: Ebbie Latus on: 03/26/2019 01:13 PM   Modules accepted: Orders

## 2019-03-26 NOTE — Progress Notes (Signed)
Reviewed and agree with documentation and assessment and plan. K. Veena Zyana Amaro , MD   

## 2019-03-26 NOTE — Progress Notes (Signed)
Chief Complaint: Cirrhosis  HPI:    [name] is a 68 year old male, known to Dr. Silverio [name] from hospitalization for GI bleed, with a past medical history as listed below including CAD with cirrhosis, status post aortic valve replacement on Coumadin, COPD and partial colectomy in 2011 due to diverticulitis who was referred to me by Thayer Headings, MD for a complaint of cirrhosis.      01/18/2018 EGD with Dr. Silverio [name] with LA grade C reflux esophagitis, salmon-colored mucosa suspicious for Barrett's, Mallory-Weiss tear, gastritis and normal duodenum.    02/02/2019 CBC with hemoglobin of 12.2, platelets low at 131.    03/17/2019 office visit with infectious disease.  Patient was following up for chronic hepatitis C.  He was on Harvoni.  CMP with creatinine 1.77, alk phos 157, albumin 3.4 and otherwise normal.    10/22/2018 right upper quadrant ultrasound.  The appearance of the liver is consistent with known underlying cirrhosis.  Small cyst in the right lobe near the gallbladder fossa.    Today, patient presents from nursing home and does have nursing facility person with him who does assist with history.  He explains that he has no real complaints at this time.  He is doing well from his perspective.  Tells me that he is managing to keep his weight level.  He continues on Furosemide 40 mg twice daily and Spironolactone 50 mg daily.  Also on Lactulose 15 mL daily and having at least 2-3 soft solid bowel movements a day.  Also using Protonix 40 mg daily and denies any symptoms of heartburn reflux or abdominal pain.    Denies fever, chills, weight loss, nausea, vomiting, abdominal distention or peripheral edema.  Past Medical History:  Diagnosis Date  . COPD (chronic obstructive pulmonary disease) ([place])   . Diabetes mellitus without complication ([place])   . H/O diverticulitis of colon 11/03/2012   History of partial colectomy in 2011.  . Mechanical heart valve present   . Overdose of muscle relaxant  08/11/2017  . Pre-diabetes   . Retroperitoneal hematoma 01/29/2018  . S/P AVR (aortic valve replacement)   . Seizures ([place])   . 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 CARDIAC SURGERY    . CHOLECYSTECTOMY    . COLON SURGERY    . CORONARY/GRAFT ANGIOGRAPHY N/A 10/31/2017   Procedure: CORONARY/GRAFT ANGIOGRAPHY;  Surgeon: Nelva Bush, MD;  Location: [facility];  Service: Cardiovascular;  Laterality: N/A;  . ESOPHAGOGASTRODUODENOSCOPY (EGD) WITH PROPOFOL N/A 01/18/2018   Procedure: ESOPHAGOGASTRODUODENOSCOPY (EGD) WITH PROPOFOL;  Surgeon: Mauri Pole, MD;  Location: [facility];  Service: Endoscopy;  Laterality: N/A;  . JOINT REPLACEMENT    . KNEE SURGERY Left     Current Outpatient Medications  Medication Sig Dispense Refill  . albuterol (VENTOLIN HFA) 108 (90 Base) MCG/ACT inhaler Inhale 2 puffs into the lungs every 4 (four) hours as needed for wheezing or shortness of breath. 18 g 2  . atorvastatin (LIPITOR) 10 MG tablet TAKE 1 TABLET BY MOUTH EVERY DAY 90 tablet 0  . carvedilol (COREG) 6.25 MG tablet TAKE 1 TABLET (6.25 MG TOTAL) BY MOUTH 2 (TWO) TIMES DAILY WITH A MEAL. 180 tablet 1  . escitalopram (LEXAPRO) 10 MG tablet Take 1 tablet (10 mg total) by mouth at bedtime. (Patient taking differently: Take 10 mg by mouth daily. ) 90 tablet 3  . furosemide (LASIX) 40 MG tablet Take 1 tablet (40 mg  total) by mouth 2 (two) times daily. Please take '80mg'$  am, '40mg'$  in pm for 5 days and then resume '40mg'$  BID 90 tablet 3  . glucose blood test strip Test blood sugar TID qAC and qHS 100 each 12  . HYDROcodone-acetaminophen (NORCO/VICODIN) 5-325 MG tablet Take 1-2 tablets by mouth every 12 (twelve) hours as needed for moderate pain. 60 tablet 0  . insulin aspart (NOVOLOG FLEXPEN) 100 UNIT/ML FlexPen INJECT 10 UNITS INTO THE SKIN 3 (THREE) TIMES DAILY WITH MEALS. ADJUST DOSE AS NEEDED 45 mL 1  . insulin detemir (LEVEMIR) 100  UNIT/ML injection Inject 0.28 mLs (28 Units total) into the skin at bedtime. Increase Levemir by 2 units when the week average is >200. 10 mL 3  . Insulin Pen Needle (ADVOCATE INSULIN PEN NEEDLES) 31G X 5 MM MISC 1 Package by Does not apply route 4 (four) times daily. 100 each 2  . lactulose (CHRONULAC) 10 GM/15ML solution Take 15 mLs (10 g total) by mouth daily. 473 mL 3  . Lancets MISC 1 Stick by Does not apply route 4 (four) times daily -  with meals and at bedtime. 200 each 3  . Ledipasvir-Sofosbuvir (HARVONI) 90-400 MG TABS Take 1 tablet by mouth daily. 28 tablet 5  . levETIRAcetam (KEPPRA) 1000 MG tablet Take 1 tablet (1,000 mg total) by mouth 2 (two) times daily. 180 tablet 3  . memantine (NAMENDA) 5 MG tablet Take 1 tablet (5 mg total) by mouth 2 (two) times daily. 180 tablet 3  . pantoprazole (PROTONIX) 40 MG tablet Take 1 tablet (40 mg total) by mouth daily. 90 tablet 3  . QUEtiapine (SEROQUEL) 100 MG tablet TAKE HALF A TABLET IN THE MORNING AND 1.5 TABLETS IN THE EVENING 180 tablet 1  . warfarin (COUMADIN) 4 MG tablet Take 1 tablet (4 mg total) by mouth daily. (Patient taking differently: Take 4 mg by mouth daily. '2mg'$  on Fridays) 90 tablet 3  . spironolactone (ALDACTONE) 50 MG tablet Take 1 tablet (50 mg total) by mouth daily for 30 days. 30 tablet 2   No current facility-administered medications for this visit.    Allergies as of 03/26/2019 - Review Complete 03/26/2019  Allergen Reaction Noted  . Ativan [lorazepam]  02/02/2019  . Morphine and related Other (See Comments) 12/21/2017    Family History  Problem Relation Age of Onset  . Hypertension Father     Social History   Socioeconomic History  . Marital status: Married    Spouse name: Not on file  . Number of children: 3  . Years of education: Not on file  . Highest education level: 12th grade  Occupational History  . Occupation: retired  Tobacco Use  . Smoking status: Former Smoker    Packs/day: 1.00    Types:  Cigarettes  . Smokeless tobacco: Never Used  Substance and Sexual Activity  . Alcohol use: Not Currently  . Drug use: Never  . Sexual activity: Not Currently  Other Topics Concern  . Not on file  Social History Narrative   Right handed       [facility]   Social Determinants of Health   Financial Resource Strain:   . Difficulty of Paying Living Expenses: Not on file  Food Insecurity:   . Worried About Charity fundraiser in the Last Year: Not on file  . Ran Out of Food in the Last Year: Not on file  Transportation Needs:   . Lack of Transportation (Medical): Not on file  .  Lack of Transportation (Non-Medical): Not on file  Physical Activity:   . Days of Exercise per Week: Not on file  . Minutes of Exercise per Session: Not on file  Stress:   . Feeling of Stress : Not on file  Social Connections:   . Frequency of Communication with Friends and Family: Not on file  . Frequency of Social Gatherings with Friends and Family: Not on file  . Attends Religious Services: Not on file  . Active Member of Clubs or Organizations: Not on file  . Attends Archivist Meetings: Not on file  . Marital Status: Not on file  Intimate Partner Violence:   . Fear of Current or Ex-Partner: Not on file  . Emotionally Abused: Not on file  . Physically Abused: Not on file  . Sexually Abused: Not on file    Review of Systems:    Constitutional: No weight loss, fever or chills Cardiovascular: No chest pain Respiratory: No SOB  Gastrointestinal: See HPI and otherwise negative   Physical Exam:  Vital signs: BP 132/70   Pulse 70   Temp 98.4 F (36.9 C)   Ht '5\' 11"'$  (1.803 m)   Wt (!) 302 lb (137 kg)   BMI 42.12 kg/m   Constitutional:   Pleasant overweight Caucasian male appears to be in NAD, Well developed, Well nourished, alert and cooperative Respiratory: Respirations even and unlabored. Lungs clear to auscultation bilaterally.   No wheezes, crackles, or rhonchi.    Cardiovascular: Normal S1, S2. No MRG. Regular rate and rhythm. No peripheral edema, cyanosis or pallor.  Gastrointestinal:  Soft, nondistended, nontender. No rebound or guarding. Normal bowel sounds. No appreciable masses or hepatomegaly. Rectal:  Not performed.  Msk:  Symmetrical without gross deformities. Without edema, no deformity or joint abnormality. Ambulates with walker Psychiatric: Oriented to person, place and time. Demonstrates good judgement and reason without abnormal affect or behaviors.  RELEVANT LABS AND IMAGING: CBC    Component Value Date/Time   WBC 8.3 02/02/2019 1600   WBC 11.5 (H) 01/30/2018 0346   RBC 4.54 02/02/2019 1600   RBC 4.00 (L) 01/30/2018 0346   HGB 12.2 (L) 02/02/2019 1600   HCT 37.4 (L) 02/02/2019 1600   PLT 131 (L) 02/02/2019 1600   MCV 82 02/02/2019 1600   MCH 26.9 02/02/2019 1600   MCH 26.8 01/30/2018 0346   MCHC 32.6 02/02/2019 1600   MCHC 30.4 01/30/2018 0346   RDW 16.8 (H) 02/02/2019 1600   LYMPHSABS 1.4 01/26/2018 0350   MONOABS 1.7 (H) 01/26/2018 0350   EOSABS 0.2 01/26/2018 0350   BASOSABS 0.0 01/26/2018 0350    CMP     Component Value Date/Time   NA 137 03/17/2019 1459   NA 136 02/02/2019 1600   K 4.0 03/17/2019 1459   CL 99 03/17/2019 1459   CO2 28 03/17/2019 1459   GLUCOSE 146 (H) 03/17/2019 1459   BUN 26 (H) 03/17/2019 1459   BUN 29 (H) 02/02/2019 1600   CREATININE 1.77 (H) 03/17/2019 1459   CALCIUM 8.7 03/17/2019 1459   PROT 7.0 03/17/2019 1459   PROT 7.1 02/02/2019 1600   ALBUMIN 3.5 (L) 02/02/2019 1600   AST 30 03/17/2019 1459   ALT 16 03/17/2019 1459   ALT 31 01/12/2018 1135   ALKPHOS 172 (H) 02/02/2019 1600   BILITOT 0.7 03/17/2019 1459   BILITOT 0.6 02/02/2019 1600   GFRNONAA 39 (L) 03/17/2019 1459   GFRAA 45 (L) 03/17/2019 1459    Assessment: 1.  Hepatitis  C with cirrhosis: Doing well at this time, stable  Plan: 1.  Up-to-date on [place] screening with recent ultrasound, also scheduled for repeat ultrasound  from IR in the next month. 2.  Recent labs look stable. 3.  Continue current medications. 4.  Patient was placed in recall for an appointment in 7 months with Dr. Silverio [name] or myself.  At that time can discuss repeat EGD for variceal screening. 5.  Patient will call our clinic before then if he has any problems.  Ellouise Newer, PA-C [facility] 03/26/2019, 10:27 AM  Cc: Thayer Headings, MD

## 2019-03-26 NOTE — Patient Instructions (Signed)
If you are age 68 or older, your body mass index should be between 23-30. Your Body mass index is 42.12 kg/m. If this is out of the aforementioned range listed, please consider follow up with your Primary Care Provider.  If you are age 34 or younger, your body mass index should be between 19-25. Your Body mass index is 42.12 kg/m. If this is out of the aformentioned range listed, please consider follow up with your Primary Care Provider.   Continue current medications.  Follow up with Dr. Silverio [name] in 7 months.  Thank you for choosing me and [facility].    Ellouise Newer, PA-C

## 2019-03-27 MED ORDER — HYDROCODONE-ACETAMINOPHEN 5-325 MG PO TABS: 1.0000 | ORAL_TABLET | Freq: Two times a day (BID) | ORAL | 0 refills | Status: DC | PRN

## 2019-03-27 NOTE — Addendum Note (Signed)
Addended by: [name] on: 03/27/2019 04:39 PM   Modules accepted: Orders

## 2019-03-29 ENCOUNTER — Ambulatory Visit (INDEPENDENT_AMBULATORY_CARE_PROVIDER_SITE_OTHER): Payer: Medicare Other | Admitting: *Deleted

## 2019-03-29 ENCOUNTER — Ambulatory Visit
Admission: RE | Admit: 2019-03-29 | Discharge: 2019-03-29 | Disposition: A | Discharge status: Home / Self Care | Payer: Medicare Other | Source: Ambulatory Visit | Attending: Internal Medicine | Admitting: Internal Medicine

## 2019-03-29 DIAGNOSIS — Z952 Presence of prosthetic heart valve: Secondary | ICD-10-CM

## 2019-03-29 DIAGNOSIS — R2681 Unsteadiness on feet: Secondary | ICD-10-CM | POA: Diagnosis not present

## 2019-03-29 DIAGNOSIS — Z5181 Encounter for therapeutic drug level monitoring: Secondary | ICD-10-CM | POA: Diagnosis not present

## 2019-03-29 DIAGNOSIS — I69398 Other sequelae of cerebral infarction: Secondary | ICD-10-CM | POA: Diagnosis not present

## 2019-03-29 DIAGNOSIS — M6281 Muscle weakness (generalized): Secondary | ICD-10-CM | POA: Diagnosis not present

## 2019-03-29 DIAGNOSIS — I5032 Chronic diastolic (congestive) heart failure: Secondary | ICD-10-CM | POA: Diagnosis not present

## 2019-03-29 DIAGNOSIS — B182 Chronic viral hepatitis C: Secondary | ICD-10-CM

## 2019-03-29 DIAGNOSIS — I69311 Memory deficit following cerebral infarction: Secondary | ICD-10-CM | POA: Diagnosis not present

## 2019-03-29 DIAGNOSIS — I110 Hypertensive heart disease with heart failure: Secondary | ICD-10-CM | POA: Diagnosis not present

## 2019-03-29 LAB — POCT INR: INR: 2.2 (ref 2.0–3.0)

## 2019-03-29 NOTE — Patient Instructions (Signed)
Continue warfarin 4mg  daily except 2mg  on Fridays. Moved to [facility] 7/30. Started Harvoni 11/19/18 (can decrease INR).  Recheck INR in 2 weeks. Order given to [facility] and faxed to [place] [phone]

## 2019-04-05 MED FILL — HARVONI 90-400 MG TABLET: 90-400 | 28 days supply | Qty: 28 | Fill #4

## 2019-04-12 DIAGNOSIS — M6281 Muscle weakness (generalized): Secondary | ICD-10-CM | POA: Diagnosis not present

## 2019-04-12 DIAGNOSIS — I5032 Chronic diastolic (congestive) heart failure: Secondary | ICD-10-CM | POA: Diagnosis not present

## 2019-04-12 DIAGNOSIS — I69398 Other sequelae of cerebral infarction: Secondary | ICD-10-CM | POA: Diagnosis not present

## 2019-04-12 DIAGNOSIS — I110 Hypertensive heart disease with heart failure: Secondary | ICD-10-CM | POA: Diagnosis not present

## 2019-04-12 DIAGNOSIS — R2681 Unsteadiness on feet: Secondary | ICD-10-CM | POA: Diagnosis not present

## 2019-04-12 DIAGNOSIS — I69311 Memory deficit following cerebral infarction: Secondary | ICD-10-CM | POA: Diagnosis not present

## 2019-04-13 DIAGNOSIS — F0391 Unspecified dementia with behavioral disturbance: Secondary | ICD-10-CM | POA: Diagnosis not present

## 2019-04-13 DIAGNOSIS — Z7901 Long term (current) use of anticoagulants: Secondary | ICD-10-CM | POA: Diagnosis not present

## 2019-04-13 DIAGNOSIS — I69398 Other sequelae of cerebral infarction: Secondary | ICD-10-CM | POA: Diagnosis not present

## 2019-04-13 DIAGNOSIS — I69311 Memory deficit following cerebral infarction: Secondary | ICD-10-CM | POA: Diagnosis not present

## 2019-04-13 DIAGNOSIS — M6281 Muscle weakness (generalized): Secondary | ICD-10-CM | POA: Diagnosis not present

## 2019-04-13 DIAGNOSIS — Z87891 Personal history of nicotine dependence: Secondary | ICD-10-CM | POA: Diagnosis not present

## 2019-04-13 DIAGNOSIS — E119 Type 2 diabetes mellitus without complications: Secondary | ICD-10-CM | POA: Diagnosis not present

## 2019-04-13 DIAGNOSIS — R2681 Unsteadiness on feet: Secondary | ICD-10-CM | POA: Diagnosis not present

## 2019-04-13 DIAGNOSIS — J449 Chronic obstructive pulmonary disease, unspecified: Secondary | ICD-10-CM | POA: Diagnosis not present

## 2019-04-13 DIAGNOSIS — I5032 Chronic diastolic (congestive) heart failure: Secondary | ICD-10-CM | POA: Diagnosis not present

## 2019-04-13 DIAGNOSIS — I110 Hypertensive heart disease with heart failure: Secondary | ICD-10-CM | POA: Diagnosis not present

## 2019-04-13 DIAGNOSIS — Z794 Long term (current) use of insulin: Secondary | ICD-10-CM | POA: Diagnosis not present

## 2019-04-16 HISTORY — PX: OTHER SURGICAL HISTORY: SHX169

## 2019-04-20 DIAGNOSIS — Z23 Encounter for immunization: Secondary | ICD-10-CM | POA: Diagnosis not present

## 2019-04-26 ENCOUNTER — Other Ambulatory Visit: Payer: Self-pay | Admitting: *Deleted

## 2019-04-26 ENCOUNTER — Ambulatory Visit (INDEPENDENT_AMBULATORY_CARE_PROVIDER_SITE_OTHER): Payer: Medicare Other | Admitting: *Deleted

## 2019-04-26 DIAGNOSIS — M6281 Muscle weakness (generalized): Secondary | ICD-10-CM | POA: Diagnosis not present

## 2019-04-26 DIAGNOSIS — I110 Hypertensive heart disease with heart failure: Secondary | ICD-10-CM | POA: Diagnosis not present

## 2019-04-26 DIAGNOSIS — S32010S Wedge compression fracture of first lumbar vertebra, sequela: Secondary | ICD-10-CM

## 2019-04-26 DIAGNOSIS — Z952 Presence of prosthetic heart valve: Secondary | ICD-10-CM

## 2019-04-26 DIAGNOSIS — R2681 Unsteadiness on feet: Secondary | ICD-10-CM | POA: Diagnosis not present

## 2019-04-26 DIAGNOSIS — I69311 Memory deficit following cerebral infarction: Secondary | ICD-10-CM | POA: Diagnosis not present

## 2019-04-26 DIAGNOSIS — I69398 Other sequelae of cerebral infarction: Secondary | ICD-10-CM | POA: Diagnosis not present

## 2019-04-26 DIAGNOSIS — I5032 Chronic diastolic (congestive) heart failure: Secondary | ICD-10-CM | POA: Diagnosis not present

## 2019-04-26 DIAGNOSIS — Z5181 Encounter for therapeutic drug level monitoring: Secondary | ICD-10-CM

## 2019-04-26 LAB — POCT INR: INR: 2 (ref 2.0–3.0)

## 2019-04-26 NOTE — Patient Instructions (Signed)
Increase warfarin to 4mg  daily. Moved to [facility] 7/30. Started Harvoni 11/19/18 (can decrease INR).  Recheck INR in 2 weeks. Order given to [facility] and faxed to [place] [phone]

## 2019-04-27 MED ORDER — HYDROCODONE-ACETAMINOPHEN 5-325 MG PO TABS: 1.0000 | ORAL_TABLET | Freq: Two times a day (BID) | ORAL | 0 refills | Status: DC | PRN

## 2019-05-03 ENCOUNTER — Telehealth: Payer: Self-pay | Admitting: Pharmacy Technician

## 2019-05-03 NOTE — Telephone Encounter (Addendum)
[facility] Patient Advocate Encounter   Received notification from [place] that prior authorization for Harvoni is required.  This is for the final month of 6.   PA submitted on 05/03/2019 PA#  [id number] Status is APPROVED.    [facility] is aware.   [name]. [name] [name] [facility] Phone: [phone] Fax:  [phone]

## 2019-05-04 MED FILL — HARVONI 90-400 MG TABLET: 90-400 | 28 days supply | Qty: 28 | Fill #5

## 2019-05-05 DIAGNOSIS — Z20828 Contact with and (suspected) exposure to other viral communicable diseases: Secondary | ICD-10-CM | POA: Diagnosis not present

## 2019-05-12 DIAGNOSIS — Z03818 Encounter for observation for suspected exposure to other biological agents ruled out: Secondary | ICD-10-CM | POA: Diagnosis not present

## 2019-05-13 ENCOUNTER — Ambulatory Visit (INDEPENDENT_AMBULATORY_CARE_PROVIDER_SITE_OTHER): Payer: Medicare Other | Admitting: *Deleted

## 2019-05-13 DIAGNOSIS — J449 Chronic obstructive pulmonary disease, unspecified: Secondary | ICD-10-CM | POA: Diagnosis not present

## 2019-05-13 DIAGNOSIS — Z87891 Personal history of nicotine dependence: Secondary | ICD-10-CM | POA: Diagnosis not present

## 2019-05-13 DIAGNOSIS — I69311 Memory deficit following cerebral infarction: Secondary | ICD-10-CM | POA: Diagnosis not present

## 2019-05-13 DIAGNOSIS — Z952 Presence of prosthetic heart valve: Secondary | ICD-10-CM | POA: Diagnosis not present

## 2019-05-13 DIAGNOSIS — M6281 Muscle weakness (generalized): Secondary | ICD-10-CM | POA: Diagnosis not present

## 2019-05-13 DIAGNOSIS — E119 Type 2 diabetes mellitus without complications: Secondary | ICD-10-CM | POA: Diagnosis not present

## 2019-05-13 DIAGNOSIS — Z7901 Long term (current) use of anticoagulants: Secondary | ICD-10-CM | POA: Diagnosis not present

## 2019-05-13 DIAGNOSIS — I110 Hypertensive heart disease with heart failure: Secondary | ICD-10-CM | POA: Diagnosis not present

## 2019-05-13 DIAGNOSIS — Z794 Long term (current) use of insulin: Secondary | ICD-10-CM | POA: Diagnosis not present

## 2019-05-13 DIAGNOSIS — I69398 Other sequelae of cerebral infarction: Secondary | ICD-10-CM | POA: Diagnosis not present

## 2019-05-13 DIAGNOSIS — Z5181 Encounter for therapeutic drug level monitoring: Secondary | ICD-10-CM | POA: Diagnosis not present

## 2019-05-13 DIAGNOSIS — I5032 Chronic diastolic (congestive) heart failure: Secondary | ICD-10-CM | POA: Diagnosis not present

## 2019-05-13 DIAGNOSIS — F0391 Unspecified dementia with behavioral disturbance: Secondary | ICD-10-CM | POA: Diagnosis not present

## 2019-05-13 DIAGNOSIS — R2681 Unsteadiness on feet: Secondary | ICD-10-CM | POA: Diagnosis not present

## 2019-05-13 LAB — POCT INR: INR: 2.2 (ref 2.0–3.0)

## 2019-05-13 NOTE — Patient Instructions (Signed)
Continue warfarin 4mg  daily. Moved to [facility] 7/30. Started Harvoni 11/19/18 (can decrease INR).  Recheck INR in 2 weeks. Order given to Kathlee Nations RN [facility] and faxed to [place] [phone]

## 2019-05-18 ENCOUNTER — Other Ambulatory Visit: Payer: Self-pay

## 2019-05-18 ENCOUNTER — Ambulatory Visit (INDEPENDENT_AMBULATORY_CARE_PROVIDER_SITE_OTHER): Payer: Medicare Other | Admitting: Internal Medicine

## 2019-05-18 DIAGNOSIS — Z79899 Other long term (current) drug therapy: Secondary | ICD-10-CM | POA: Diagnosis not present

## 2019-05-18 DIAGNOSIS — Z952 Presence of prosthetic heart valve: Secondary | ICD-10-CM

## 2019-05-18 DIAGNOSIS — I5030 Unspecified diastolic (congestive) heart failure: Secondary | ICD-10-CM

## 2019-05-18 DIAGNOSIS — Z794 Long term (current) use of insulin: Secondary | ICD-10-CM | POA: Diagnosis not present

## 2019-05-18 DIAGNOSIS — Z79891 Long term (current) use of opiate analgesic: Secondary | ICD-10-CM

## 2019-05-18 DIAGNOSIS — I38 Endocarditis, valve unspecified: Secondary | ICD-10-CM

## 2019-05-18 DIAGNOSIS — Z23 Encounter for immunization: Secondary | ICD-10-CM | POA: Diagnosis not present

## 2019-05-18 DIAGNOSIS — G8929 Other chronic pain: Secondary | ICD-10-CM

## 2019-05-18 DIAGNOSIS — K7460 Unspecified cirrhosis of liver: Secondary | ICD-10-CM

## 2019-05-18 DIAGNOSIS — Z87891 Personal history of nicotine dependence: Secondary | ICD-10-CM

## 2019-05-18 DIAGNOSIS — Z7901 Long term (current) use of anticoagulants: Secondary | ICD-10-CM

## 2019-05-18 DIAGNOSIS — B182 Chronic viral hepatitis C: Secondary | ICD-10-CM | POA: Diagnosis not present

## 2019-05-18 DIAGNOSIS — S32010S Wedge compression fracture of first lumbar vertebra, sequela: Secondary | ICD-10-CM

## 2019-05-18 DIAGNOSIS — E119 Type 2 diabetes mellitus without complications: Secondary | ICD-10-CM

## 2019-05-18 DIAGNOSIS — M8448XD Pathological fracture, other site, subsequent encounter for fracture with routine healing: Secondary | ICD-10-CM

## 2019-05-18 NOTE — Progress Notes (Signed)
  [facility] Internal Medicine Residency Telephone Encounter Continuity Care Appointment  HPI:   This telephone encounter was created for Mr. Stoy Strouse on 05/19/2019 for the following purpose/cc Diabetes. Mr.Riss presents via telephone for continued management. Mr.Parmelee was evaluated with assistance of nurse manager Pierce with [facility]. He states that at the moment he has no complaints at this time. Denies any significant chest pain, palpitations, dyspnea, orthopnea. Does mention that he would like to leave the ALF if possible to stay at home but discussed in detail regarding his comorbidities and need for supervision and Mr.Dohrmann agreed he would stay.  Sharyn Lull provides additional history staying Mr.Zumbro's weight and lower extremity swelling is stable and his blood sugars have been around 130-150s fasting. Denies any significant hypoglycemic episodes and mentions Mr.Bringle using his hydrocodone only occasionally instead of every 12 hours for his back pain.    Past Medical History:  Past Medical History:  Diagnosis Date  . COPD (chronic obstructive pulmonary disease) ([place])   . Diabetes mellitus without complication ([place])   . H/O diverticulitis of colon 11/03/2012   History of partial colectomy in 2011.  . Mechanical heart valve present   . Overdose of muscle relaxant 08/11/2017  . Pre-diabetes   . Retroperitoneal hematoma 01/29/2018  . S/P AVR (aortic valve replacement)   . Seizures ([place])   . 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  Eyes: Negative for blurred vision.  Respiratory: Negative for cough and shortness of breath.   Cardiovascular: Positive for leg swelling. Negative for chest pain and palpitations.  Gastrointestinal: Negative for constipation, diarrhea, nausea and vomiting.  Musculoskeletal: Positive for back pain.  Neurological: Negative for dizziness, sensory change  and headaches.     Assessment / Plan / Recommendations:   Please see A&P under problem oriented charting for assessment of the patient's acute and chronic medical conditions.   As always, pt is advised that if symptoms worsen or new symptoms arise, they should go to an urgent care facility or to to ER for further evaluation.   Consent and Medical Decision Making:   Patient discussed with Dr.Guilloud  This is a telephone encounter between [employer] and [name] on 05/19/2019 for . The visit was conducted with the patient located at home and [name] at [facility]. The patient's identity was confirmed using their DOB and current address. The his/her legal guardian has consented to being evaluated through a telephone encounter and understands the associated risks (an examination cannot be done and the patient may need to come in for an appointment) / benefits (allows the patient to remain at home, decreasing exposure to coronavirus). I personally spent 21 minutes on medical discussion.

## 2019-05-19 ENCOUNTER — Encounter: Payer: Self-pay | Admitting: Internal Medicine

## 2019-05-19 DIAGNOSIS — Z03818 Encounter for observation for suspected exposure to other biological agents ruled out: Secondary | ICD-10-CM | POA: Diagnosis not present

## 2019-05-19 MED ORDER — GLUCOSE BLOOD VI STRP: ORAL_STRIP | 12 refills | Status: DC

## 2019-05-19 MED ORDER — LANCETS MISC: 1.0000 | Freq: Three times a day (TID) | 3 refills | Status: DC

## 2019-05-19 NOTE — Assessment & Plan Note (Addendum)
Lab Results  Component Value Date   HGBA1C 7.5 (A) 02/02/2019   [name] examined and evaluated via telephone with assistance of [place]. Sharyn Lull reports fasting blood sugars that range from 130-150s. Consistent with prior testing. Tolerating Levemir and novolog without significant hypoglycemic events.  - C/w Levemir 28 units qhs - C/w novolog 10 units TID qc - Script for glucometer strips and lancets sent - Hgb a1c ordered to be drawn by home health nurse

## 2019-05-19 NOTE — Assessment & Plan Note (Signed)
Currently on anticoagulation for history of mechanical valve. INR checked by home health nurse and within therapeutic range at 2.2. Family mentions recent dental procedure and noted dried blood on his pillows. Discussed need to return to dentist if bleeding does not resolve although Sharyn Lull denies any significant current bleeding.  - Check cbc

## 2019-05-19 NOTE — Assessment & Plan Note (Signed)
Mr. Character was examined via telephone encounter with assistance of Sharyn Lull, Midwife, at [facility]. He mentions having stable lower extremity edema. Per staff at [facility], his weight has remained stable around 303lbs consistent w/ his previous weight noted during clinic visit. Currently denies any significant dyspnea, chest pain, palpitations, lower extremity edema or orthopnea. Discussed monitoring daily weight and Adam[facility] and his nurse expressed understanding.  - C/w furosemide 40mg  BID - CMP to be drawn by home health nurse

## 2019-05-19 NOTE — Assessment & Plan Note (Addendum)
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 As part of the treatment plan, the [place] controlled substance database is checked at least twice yearly and the database results was checked 05/18/19 and did not reveal appropriate dispenses despite scripts being sent to his pharmacy. Spoke with pharmacist at [facility] at [place] who was able to confirm appropriate monthly dispense history.   Pain contract not signed as patient has dementia. Discussed with daughter, Caryl Pina, regarding use of pain medication only as needed for uncontrolled pain and to assist in Mr.Weisheit achieving his goal of participating in physical therapy.  The patient is on Hydrocodone-acetaminophen 5-300mg  every 12 hours, 60 per 30 days. Adjunctive treatment includes home PT. This regimen allows [name] to function without excessive sedation or side effects. The benefits of continuing opioid therapy outweigh the risks and chronic opioids will be continued. Ongoing education about safe opioid treatment is provided.   - F/u w/ ortho - F/u in 3 months

## 2019-05-19 NOTE — Assessment & Plan Note (Addendum)
Follows with Dr.Comer for hep C and Dr.Nandigam for cirrhosis. Per chart review, most recent Hep C viral load undetectable. Dr.Nandigam planning on screening for varices with EGD in 7 months. Unable to calculate accurate MELD/Child-Pugh scores for mortality estimate due to artifically elevated INR as he is on Coumadin for mechanical aortic valve. Would not be an appropriate candidate for transplant due to co-morbidites but would defer to GI. Per nursing staff at ALF, not endorsing asterixis or significant worsening encephalopathy. Slightly confused at baseline due to dementia but AAOx3 on my conversation with Adam Savage today.  - C/w lactulose - F/u with GI

## 2019-05-20 NOTE — Progress Notes (Signed)
Internal Medicine Clinic Attending ° °Case discussed with Dr. Lee at the time of the visit.  We reviewed the resident’s history and exam and pertinent patient test results.  I agree with the assessment, diagnosis, and plan of care documented in the resident’s note.  °

## 2019-05-24 ENCOUNTER — Ambulatory Visit (INDEPENDENT_AMBULATORY_CARE_PROVIDER_SITE_OTHER): Payer: Medicare Other | Admitting: *Deleted

## 2019-05-24 DIAGNOSIS — R2681 Unsteadiness on feet: Secondary | ICD-10-CM | POA: Diagnosis not present

## 2019-05-24 DIAGNOSIS — Z952 Presence of prosthetic heart valve: Secondary | ICD-10-CM

## 2019-05-24 DIAGNOSIS — I69398 Other sequelae of cerebral infarction: Secondary | ICD-10-CM | POA: Diagnosis not present

## 2019-05-24 DIAGNOSIS — I38 Endocarditis, valve unspecified: Secondary | ICD-10-CM | POA: Diagnosis not present

## 2019-05-24 DIAGNOSIS — M6281 Muscle weakness (generalized): Secondary | ICD-10-CM | POA: Diagnosis not present

## 2019-05-24 DIAGNOSIS — Z03818 Encounter for observation for suspected exposure to other biological agents ruled out: Secondary | ICD-10-CM | POA: Diagnosis not present

## 2019-05-24 DIAGNOSIS — I69311 Memory deficit following cerebral infarction: Secondary | ICD-10-CM | POA: Diagnosis not present

## 2019-05-24 DIAGNOSIS — I5030 Unspecified diastolic (congestive) heart failure: Secondary | ICD-10-CM | POA: Diagnosis not present

## 2019-05-24 DIAGNOSIS — Z794 Long term (current) use of insulin: Secondary | ICD-10-CM | POA: Diagnosis not present

## 2019-05-24 DIAGNOSIS — Z5181 Encounter for therapeutic drug level monitoring: Secondary | ICD-10-CM | POA: Diagnosis not present

## 2019-05-24 DIAGNOSIS — I110 Hypertensive heart disease with heart failure: Secondary | ICD-10-CM | POA: Diagnosis not present

## 2019-05-24 DIAGNOSIS — E119 Type 2 diabetes mellitus without complications: Secondary | ICD-10-CM | POA: Diagnosis not present

## 2019-05-24 DIAGNOSIS — Z7901 Long term (current) use of anticoagulants: Secondary | ICD-10-CM | POA: Diagnosis not present

## 2019-05-24 DIAGNOSIS — I5032 Chronic diastolic (congestive) heart failure: Secondary | ICD-10-CM | POA: Diagnosis not present

## 2019-05-24 LAB — POCT INR: INR: 3.4 — AB (ref 2.0–3.0)

## 2019-05-24 NOTE — Patient Instructions (Signed)
Hold warfarin tonight then resume 4mg  daily. Moved to [facility] 7/30. Started Harvoni 11/19/18 (can decrease INR).  Recheck INR in 1 week. Order given to [facility] and faxed to [place] [phone]

## 2019-05-26 ENCOUNTER — Ambulatory Visit (INDEPENDENT_AMBULATORY_CARE_PROVIDER_SITE_OTHER): Payer: Medicare Other | Admitting: Internal Medicine

## 2019-05-26 ENCOUNTER — Other Ambulatory Visit: Payer: Self-pay

## 2019-05-26 ENCOUNTER — Encounter: Payer: Self-pay | Admitting: Internal Medicine

## 2019-05-26 VITALS — BP 102/62 | HR 74 | Temp 98.2°F

## 2019-05-26 DIAGNOSIS — Z03818 Encounter for observation for suspected exposure to other biological agents ruled out: Secondary | ICD-10-CM | POA: Diagnosis not present

## 2019-05-26 DIAGNOSIS — B182 Chronic viral hepatitis C: Secondary | ICD-10-CM | POA: Diagnosis not present

## 2019-05-26 DIAGNOSIS — K7460 Unspecified cirrhosis of liver: Secondary | ICD-10-CM | POA: Diagnosis not present

## 2019-05-26 DIAGNOSIS — K7290 Hepatic failure, unspecified without coma: Secondary | ICD-10-CM | POA: Diagnosis not present

## 2019-05-26 DIAGNOSIS — K7682 Hepatic encephalopathy: Secondary | ICD-10-CM | POA: Insufficient documentation

## 2019-05-26 NOTE — Assessment & Plan Note (Signed)
Will check EOT lab today and have him return in 6 months for SVR 24.

## 2019-05-26 NOTE — Progress Notes (Signed)
   Subjective:    Patient ID: Adam Savage, male    DOB: December 06, 1950, 69 y.o.   MRN: [id number]  HPI Here for follow up of chronic hepatitis C. He has now been on Harvoni for over 24 weeks for genotype 1 chronic hepatitis C with decompensated cirrhosis.  Last viral load undetectable.  No new complaints.  Here with a caregiver.     Review of Systems  Constitutional: Negative for fatigue.  Gastrointestinal: Negative for diarrhea and nausea.  Skin: Negative for rash.       Objective:   Physical Exam Constitutional:      Appearance: Normal appearance.  Eyes:     General: No scleral icterus. Pulmonary:     Effort: Pulmonary effort is normal.  Neurological:     Mental Status: He is alert.  Psychiatric:        Mood and Affect: Mood normal.   SH: no alcohol        Assessment & Plan:

## 2019-05-26 NOTE — Addendum Note (Signed)
Addended by: Thayer Headings on: 05/26/2019 10:44 AM   Modules accepted: Orders

## 2019-05-26 NOTE — Assessment & Plan Note (Signed)
He continues on lactulose

## 2019-05-26 NOTE — Assessment & Plan Note (Signed)
[place] screening done and no concerns.  Repeat every 6 months.

## 2019-05-30 LAB — COMPLETE METABOLIC PANEL WITH GFR
AG Ratio: 0.9 (calc) — ABNORMAL LOW (ref 1.0–2.5)
ALT: 12 U/L (ref 9–46)
AST: 22 U/L (ref 10–35)
Albumin: 3.1 g/dL — ABNORMAL LOW (ref 3.6–5.1)
Alkaline phosphatase (APISO): 147 U/L — ABNORMAL HIGH (ref 35–144)
BUN/Creatinine Ratio: 14 (calc) (ref 6–22)
BUN: 21 mg/dL (ref 7–25)
CO2: 26 mmol/L (ref 20–32)
Calcium: 8.3 mg/dL — ABNORMAL LOW (ref 8.6–10.3)
Chloride: 101 mmol/L (ref 98–110)
Creat: 1.51 mg/dL — ABNORMAL HIGH (ref 0.70–1.25)
GFR, Est African American: 54 mL/min/{1.73_m2} — ABNORMAL LOW (ref >=60)
GFR, Est Non African American: 46 mL/min/{1.73_m2} — ABNORMAL LOW (ref >=60)
Globulin: 3.6 g/dL (calc) (ref 1.9–3.7)
Glucose, Bld: 227 mg/dL — ABNORMAL HIGH (ref 65–99)
Potassium: 4 mmol/L (ref 3.5–5.3)
Sodium: 135 mmol/L (ref 135–146)
Total Bilirubin: 0.8 mg/dL (ref 0.2–1.2)
Total Protein: 6.7 g/dL (ref 6.1–8.1)

## 2019-05-30 LAB — HEPATITIS C RNA QUANTITATIVE
HCV Quantitative Log: <1.18 Log IU/mL — AB
HCV RNA, PCR, QN: <15 IU/mL — AB

## 2019-05-31 ENCOUNTER — Telehealth: Payer: Self-pay | Admitting: Internal Medicine

## 2019-05-31 DIAGNOSIS — Z03818 Encounter for observation for suspected exposure to other biological agents ruled out: Secondary | ICD-10-CM | POA: Diagnosis not present

## 2019-05-31 DIAGNOSIS — W19XXXA Unspecified fall, initial encounter: Secondary | ICD-10-CM

## 2019-05-31 NOTE — Telephone Encounter (Signed)
Order placed for portable pelvic xray. Thanks!

## 2019-05-31 NOTE — Telephone Encounter (Signed)
Dr Truman Hayward out. Agree with eval.

## 2019-05-31 NOTE — Telephone Encounter (Signed)
Pt's daughter calls and states pt fell out of bed last week, fell and landed on tailbone, he has been c/o pain since, ems was not called when the fall occurred, now he spoke w/ his daughter and said the pain is so bad he wants to go to the hospital, daughter spoke to [place] [facility] at the facility and she said they could get a mobile xray if ordered. Please advise: order for xray? Clinic appt? ED?

## 2019-05-31 NOTE — Telephone Encounter (Signed)
rtc to facility, [facility], lm for rtc Fax # [phone] Called daughter and made her aware that nurse lm for [place] [facility] at facility, she states pt is crying with pain and wonders if dr Truman Hayward could order something for him, she is reassured and ask to tell facility to take [facility] to [facility] if needed she states she will but with pt having dementia that if he must wait very long he becomes restless and loud and wants to leave. Dr Truman Hayward could you please call pt's daughter at [phone]

## 2019-05-31 NOTE — Telephone Encounter (Signed)
Pls contact pt daughter  [phone]

## 2019-06-01 ENCOUNTER — Telehealth: Payer: Self-pay

## 2019-06-01 ENCOUNTER — Other Ambulatory Visit: Payer: Self-pay | Admitting: *Deleted

## 2019-06-01 ENCOUNTER — Telehealth: Payer: Self-pay | Admitting: Internal Medicine

## 2019-06-01 DIAGNOSIS — S32010S Wedge compression fracture of first lumbar vertebra, sequela: Secondary | ICD-10-CM

## 2019-06-01 DIAGNOSIS — R102 Pelvic and perineal pain: Secondary | ICD-10-CM | POA: Diagnosis not present

## 2019-06-01 DIAGNOSIS — Z03818 Encounter for observation for suspected exposure to other biological agents ruled out: Secondary | ICD-10-CM | POA: Diagnosis not present

## 2019-06-01 MED ORDER — HYDROCODONE-ACETAMINOPHEN 5-325 MG PO TABS: 2.0000 | ORAL_TABLET | Freq: Two times a day (BID) | ORAL | 0 refills | Status: DC | PRN

## 2019-06-01 MED ORDER — HYDROCODONE-ACETAMINOPHEN 5-325 MG PO TABS: 1.0000 | ORAL_TABLET | Freq: Two times a day (BID) | ORAL | 0 refills | Status: DC | PRN

## 2019-06-01 NOTE — Telephone Encounter (Signed)
Dr. Evette Doffing,  TC received from [place] at [facility] in [place].  She states they received the Hydrocodone RX which was faxed to them today, but they cannot accept a "range" order.  Is requesting a new RX be faxed for either 1tablet Q12hours PRN or 2 tablets Q 12 hours PRN.  (please see other note). Thank you, SChaplin, RN,BSN

## 2019-06-01 NOTE — Telephone Encounter (Signed)
I will refill the hydrocodone, as it is a chronic medication and has not be prescribed in one month. However, I cannot recommend increasing the dose without him being evaluated first.   Do you think this xray was done outside of [facility] system? I don't see anything in his chart.

## 2019-06-01 NOTE — Telephone Encounter (Signed)
rtc to daughter please see other note

## 2019-06-01 NOTE — Telephone Encounter (Signed)
Hydrocodone RX faxed to [facility], confirmation receipt received. SChaplin, RN,BSN

## 2019-06-01 NOTE — Telephone Encounter (Signed)
Xray will be done today at 1100, daughter states she was called appr 7 times last night about her father's pain and he was crying she would like to know if pain med can be increased until this problem is resolved, they have tried icing and repositioning at the facility and it has not helped. We spoke again about ED or urg care and daughter would like to wait until Xray is complete due to pt's dementia.

## 2019-06-01 NOTE — Telephone Encounter (Signed)
I agree. Thank you.

## 2019-06-01 NOTE — Telephone Encounter (Signed)
Pt daughter is requesting a call back [phone] °

## 2019-06-02 ENCOUNTER — Ambulatory Visit (INDEPENDENT_AMBULATORY_CARE_PROVIDER_SITE_OTHER): Payer: Medicare Other | Admitting: *Deleted

## 2019-06-02 DIAGNOSIS — Z5181 Encounter for therapeutic drug level monitoring: Secondary | ICD-10-CM | POA: Diagnosis not present

## 2019-06-02 DIAGNOSIS — I69311 Memory deficit following cerebral infarction: Secondary | ICD-10-CM | POA: Diagnosis not present

## 2019-06-02 DIAGNOSIS — Z952 Presence of prosthetic heart valve: Secondary | ICD-10-CM | POA: Diagnosis not present

## 2019-06-02 DIAGNOSIS — M6281 Muscle weakness (generalized): Secondary | ICD-10-CM | POA: Diagnosis not present

## 2019-06-02 DIAGNOSIS — I5032 Chronic diastolic (congestive) heart failure: Secondary | ICD-10-CM | POA: Diagnosis not present

## 2019-06-02 DIAGNOSIS — I110 Hypertensive heart disease with heart failure: Secondary | ICD-10-CM | POA: Diagnosis not present

## 2019-06-02 DIAGNOSIS — I69398 Other sequelae of cerebral infarction: Secondary | ICD-10-CM | POA: Diagnosis not present

## 2019-06-02 DIAGNOSIS — R2681 Unsteadiness on feet: Secondary | ICD-10-CM | POA: Diagnosis not present

## 2019-06-02 LAB — POCT INR: INR: 2.8 (ref 2.0–3.0)

## 2019-06-02 NOTE — Patient Instructions (Signed)
Continue warfarin 4mg  daily. Moved to [facility] 7/30. Started Harvoni 11/19/18 (can decrease INR).  Recheck INR in 2 weeks. Order given to [facility] and faxed to [place] [phone]

## 2019-06-02 NOTE — Telephone Encounter (Signed)
Dtr would like to speak to pcp at some point to discuss long term pain management goals.  Pt's dtr was informed that pcp is currently out of the office and she can expect a call next week and she is agreeable.Adam Savage, Adam Boylan Cassady2/17/20213:52 PM

## 2019-06-02 NOTE — Telephone Encounter (Signed)
CMA spoke with Caryl Pina (pt's dtr) after speaking with Janett Billow at [facility] was informed that xray performed yesterday was "negative for any acute abnormalities"  (info shared with dtr).  Nanine Means will also fax result to [phone].  Pt's dtr was also made aware that rx for norco 5-325 was changed to take two tabs every 12hrs and was faxed to pharmacy yesterday-pt's dtr was pleased with this change, although Nanine Means had not received updated rx. Will call pharmacy to confirm rx was received.Despina Hidden Cassady2/17/20213:50 PM

## 2019-06-04 ENCOUNTER — Encounter (HOSPITAL_COMMUNITY): Payer: Self-pay | Admitting: Emergency Medicine

## 2019-06-04 ENCOUNTER — Other Ambulatory Visit: Payer: Self-pay

## 2019-06-04 ENCOUNTER — Emergency Department (HOSPITAL_COMMUNITY)
Admission: EM | Admit: 2019-06-04 | Discharge: 2019-06-04 | Disposition: A | Discharge status: Home / Self Care | Payer: Medicare Other | Attending: Emergency Medicine | Admitting: Emergency Medicine

## 2019-06-04 ENCOUNTER — Emergency Department (HOSPITAL_COMMUNITY): Payer: Medicare Other

## 2019-06-04 DIAGNOSIS — Z87891 Personal history of nicotine dependence: Secondary | ICD-10-CM | POA: Diagnosis not present

## 2019-06-04 DIAGNOSIS — Z7901 Long term (current) use of anticoagulants: Secondary | ICD-10-CM | POA: Insufficient documentation

## 2019-06-04 DIAGNOSIS — I5032 Chronic diastolic (congestive) heart failure: Secondary | ICD-10-CM | POA: Insufficient documentation

## 2019-06-04 DIAGNOSIS — M5440 Lumbago with sciatica, unspecified side: Secondary | ICD-10-CM | POA: Diagnosis not present

## 2019-06-04 DIAGNOSIS — F0390 Unspecified dementia without behavioral disturbance: Secondary | ICD-10-CM | POA: Insufficient documentation

## 2019-06-04 DIAGNOSIS — S3992XA Unspecified injury of lower back, initial encounter: Secondary | ICD-10-CM | POA: Diagnosis not present

## 2019-06-04 DIAGNOSIS — Z79899 Other long term (current) drug therapy: Secondary | ICD-10-CM | POA: Diagnosis not present

## 2019-06-04 DIAGNOSIS — M5441 Lumbago with sciatica, right side: Secondary | ICD-10-CM | POA: Diagnosis not present

## 2019-06-04 DIAGNOSIS — R52 Pain, unspecified: Secondary | ICD-10-CM | POA: Diagnosis not present

## 2019-06-04 DIAGNOSIS — Z9660 Presence of unspecified orthopedic joint implant: Secondary | ICD-10-CM | POA: Diagnosis not present

## 2019-06-04 DIAGNOSIS — M5489 Other dorsalgia: Secondary | ICD-10-CM | POA: Diagnosis not present

## 2019-06-04 DIAGNOSIS — M545 Low back pain: Secondary | ICD-10-CM | POA: Diagnosis present

## 2019-06-04 DIAGNOSIS — E119 Type 2 diabetes mellitus without complications: Secondary | ICD-10-CM | POA: Insufficient documentation

## 2019-06-04 DIAGNOSIS — R5381 Other malaise: Secondary | ICD-10-CM | POA: Diagnosis not present

## 2019-06-04 DIAGNOSIS — Z794 Long term (current) use of insulin: Secondary | ICD-10-CM | POA: Insufficient documentation

## 2019-06-04 DIAGNOSIS — J449 Chronic obstructive pulmonary disease, unspecified: Secondary | ICD-10-CM | POA: Insufficient documentation

## 2019-06-04 HISTORY — DX: Unspecified viral hepatitis C without hepatic coma: B19.20

## 2019-06-04 HISTORY — DX: Unspecified dementia, unspecified severity, without behavioral disturbance, psychotic disturbance, mood disturbance, and anxiety: F03.90

## 2019-06-04 HISTORY — DX: Localized edema: R60.0

## 2019-06-04 HISTORY — DX: Pure hypercholesterolemia, unspecified: E78.00

## 2019-06-04 HISTORY — DX: Depression, unspecified: F32.A

## 2019-06-04 HISTORY — DX: Gastro-esophageal reflux disease without esophagitis: K21.9

## 2019-06-04 LAB — CBC WITH DIFFERENTIAL/PLATELET
Abs Immature Granulocytes: 0.02 10*3/uL (ref 0.00–0.07)
Basophils Absolute: 0.1 10*3/uL (ref 0.0–0.1)
Basophils Relative: 1 %
Eosinophils Absolute: 0.1 10*3/uL (ref 0.0–0.5)
Eosinophils Relative: 2 %
HCT: 27.6 % — ABNORMAL LOW (ref 39.0–52.0)
Hemoglobin: 8.3 g/dL — ABNORMAL LOW (ref 13.0–17.0)
Immature Granulocytes: 0 %
Lymphocytes Relative: 18 %
Lymphs Abs: 1.3 10*3/uL (ref 0.7–4.0)
MCH: 23.3 pg — ABNORMAL LOW (ref 26.0–34.0)
MCHC: 30.1 g/dL (ref 30.0–36.0)
MCV: 77.5 fL — ABNORMAL LOW (ref 80.0–100.0)
Monocytes Absolute: 0.8 10*3/uL (ref 0.1–1.0)
Monocytes Relative: 11 %
Neutro Abs: 5.2 10*3/uL (ref 1.7–7.7)
Neutrophils Relative %: 68 %
Platelets: 114 10*3/uL — ABNORMAL LOW (ref 150–400)
RBC: 3.56 MIL/uL — ABNORMAL LOW (ref 4.22–5.81)
RDW: 15.7 % — ABNORMAL HIGH (ref 11.5–15.5)
WBC: 7.5 10*3/uL (ref 4.0–10.5)
nRBC: 0 % (ref 0.0–0.2)

## 2019-06-04 LAB — BASIC METABOLIC PANEL
Anion gap: 7 (ref 5–15)
BUN: 22 mg/dL (ref 8–23)
CO2: 30 mmol/L (ref 22–32)
Calcium: 8.4 mg/dL — ABNORMAL LOW (ref 8.9–10.3)
Chloride: 99 mmol/L (ref 98–111)
Creatinine, Ser: 1.64 mg/dL — ABNORMAL HIGH (ref 0.61–1.24)
GFR calc Af Amer: 49 mL/min — ABNORMAL LOW (ref >60)
GFR calc non Af Amer: 42 mL/min — ABNORMAL LOW (ref >60)
Glucose, Bld: 104 mg/dL — ABNORMAL HIGH (ref 70–99)
Potassium: 3.7 mmol/L (ref 3.5–5.1)
Sodium: 136 mmol/L (ref 135–145)

## 2019-06-04 MED ORDER — HYDROMORPHONE HCL 2 MG/ML IJ SOLN
2.0000 mg | Freq: Once | INTRAMUSCULAR | Status: AC
  Administered 2019-06-04: 13:00:00 2 mg via INTRAMUSCULAR
  Filled 2019-06-04: qty 1

## 2019-06-04 MED ORDER — OXYCODONE-ACETAMINOPHEN 5-325 MG PO TABS: 2.0000 | ORAL_TABLET | Freq: Four times a day (QID) | ORAL | 0 refills | Status: DC | PRN

## 2019-06-04 NOTE — ED Triage Notes (Signed)
PT brought in by [facility] today from [facility] and c/o lower back pain after he slid out of bed x7 days ago. PT states chronic lower back pain and hx of compression fractured to lumbar. PT has Norco 5/325 BID prn for pain but states medication is not helping pain.

## 2019-06-04 NOTE — ED Provider Notes (Signed)
[facility] EMERGENCY DEPARTMENT Provider Note   CSN: [id number] Arrival date & time: 06/04/19  1234     History Chief Complaint  Patient presents with  . Back Pain    Adam Savage is a 69 y.o. male.  Patient is a 69 year old male with history of COPD, diabetes, hepatitis C, lumbar compression fractures, and dementia.  He presents today by EMS for evaluation of back pain.  Patient was previously living with his daughter, but recently went into an assisted living facility.  I am told that he has repeatedly called his daughter informing her that he is having excruciating low back pain.  The patient offers little reliable history secondary to dementia.  He becomes confused easily and his recollection of dates and places is unreliable.  I spoke with the daughter, Caryl Pina over the phone.  She tells me that her dad has called her multiple times over the past few days complaining of severe pain in his back.  She tells me that he did fall approximately 1 week ago, but other than an abrasion to his arm was not complaining about his back until a few days ago.  He denies any radiation into the legs.  There are no fevers or chills.  The history is provided by the patient.  Back Pain Location:  Lumbar spine Quality:  Stabbing Radiates to:  Does not radiate Pain severity:  Severe Timing:  Constant Progression:  Worsening Chronicity:  New Relieved by:  Nothing Worsened by:  Palpation and movement Ineffective treatments:  None tried Associated symptoms: no bladder incontinence, no bowel incontinence and no fever        Past Medical History:  Diagnosis Date  . COPD (chronic obstructive pulmonary disease) ([place])   . Dementia ([place])   . Depression   . Diabetes mellitus without complication ([place])   . GERD (gastroesophageal reflux disease)   . H/O diverticulitis of colon 11/03/2012   History of partial colectomy in 2011.  Marland Kitchen Hepatitis C   . High cholesterol   . Lower extremity edema   .  Mechanical heart valve present   . Overdose of muscle relaxant 08/11/2017  . Pre-diabetes   . Retroperitoneal hematoma 01/29/2018  . S/P AVR (aortic valve replacement)   . Seizures ([place])   . 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 Hepatic encephalopathy ([place]) 05/26/2019  . Hepatic cirrhosis due to chronic hepatitis C infection ([place]) 09/16/2018  . Compression fracture of L1 lumbar vertebra ([place]) 06/18/2018  . H/O: GI bleed 06/18/2018  . Dementia associated with other underlying disease with behavioral disturbance ([place])   . S/P AVR (aortic valve replacement)   . Internal hemorrhoid, bleeding 01/14/2018  . Chronic hepatitis C without hepatic coma ([place]) 01/12/2018  . Diastolic CHF due to valvular disease ([place]) 12/23/2017  . Chronic anticoagulation   . Seizure disorder ([place])   . COPD (chronic obstructive pulmonary disease) ([place])   . Type II diabetes mellitus ([place])   . Tobacco abuse 10/30/2015  . Obesity 09/21/2015  . OSA (obstructive sleep apnea) 05/05/2015  . Tension headache, chronic 08/25/2013    Past Surgical History:  Procedure Laterality Date  . CARDIAC SURGERY    . CHOLECYSTECTOMY    . COLON SURGERY    . CORONARY/GRAFT ANGIOGRAPHY N/A 10/31/2017   Procedure: CORONARY/GRAFT ANGIOGRAPHY;  Surgeon: Nelva Bush, MD;  Location: [facility];  Service: Cardiovascular;  Laterality: N/A;  . ESOPHAGOGASTRODUODENOSCOPY (EGD) WITH  PROPOFOL N/A 01/18/2018   Procedure: ESOPHAGOGASTRODUODENOSCOPY (EGD) WITH PROPOFOL;  Surgeon: Mauri Pole, MD;  Location: [facility];  Service: Endoscopy;  Laterality: N/A;  . JOINT REPLACEMENT    . KNEE SURGERY Left        Family History  Problem Relation Age of Onset  . Hypertension Father     Social History   Tobacco Use  . Smoking status: Former Smoker    Packs/day: 1.00    Types: Cigarettes  . Smokeless tobacco: Never Used  Substance Use Topics  . Alcohol use:  Not Currently  . Drug use: Never    Home Medications Prior to Admission medications   Medication Sig Start Date End Date Taking? Authorizing Provider  albuterol (VENTOLIN HFA) 108 (90 Base) MCG/ACT inhaler Inhale 2 puffs into the lungs every 4 (four) hours as needed for wheezing or shortness of breath. 11/06/18   [name], MD  atorvastatin (LIPITOR) 10 MG tablet TAKE 1 TABLET BY MOUTH EVERY DAY 06/26/18   [name], MD  carvedilol (COREG) 6.25 MG tablet TAKE 1 TABLET (6.25 MG TOTAL) BY MOUTH 2 (TWO) TIMES DAILY WITH A MEAL. 02/08/19   [name], MD  escitalopram (LEXAPRO) 10 MG tablet Take 1 tablet (10 mg total) by mouth at bedtime. Patient taking differently: Take 10 mg by mouth daily.  06/23/18   [name], MD  furosemide (LASIX) 40 MG tablet Take 1 tablet (40 mg total) by mouth 2 (two) times daily. Please take 80mg  am, 40mg  in pm for 5 days and then resume 40mg  BID 02/03/19   [name], MD  glucose blood test strip Test blood sugar TID qAC 05/19/19   [name], MD  HYDROcodone-acetaminophen (NORCO/VICODIN) 5-325 MG tablet Take 2 tablets by mouth every 12 (twelve) hours as needed for moderate pain. 06/01/19   Axel Filler, MD  insulin aspart (NOVOLOG FLEXPEN) 100 UNIT/ML FlexPen INJECT 10 UNITS INTO THE SKIN 3 (THREE) TIMES DAILY WITH MEALS. ADJUST DOSE AS NEEDED 03/19/19   [name], MD  insulin detemir (LEVEMIR) 100 UNIT/ML injection Inject 0.28 mLs (28 Units total) into the skin at bedtime. Increase Levemir by 2 units when the week average is >200. 11/06/18   [name], MD  Insulin Pen Needle (ADVOCATE INSULIN PEN NEEDLES) 31G X 5 MM MISC 1 Package by Does not apply route 4 (four) times daily. 02/03/19   [name], MD  lactulose (CHRONULAC) 10 GM/15ML solution Take 15 mLs (10 g total) by mouth daily. 10/30/18   [name], MD  Lancets MISC 1 Stick by Does not apply route 3 (three) times daily with meals. 05/19/19   [name], MD  levETIRAcetam  (KEPPRA) 1000 MG tablet Take 1 tablet (1,000 mg total) by mouth 2 (two) times daily. 09/17/18   Cameron Sprang, MD  memantine (NAMENDA) 5 MG tablet Take 1 tablet (5 mg total) by mouth 2 (two) times daily. 09/17/18   Cameron Sprang, MD  pantoprazole (PROTONIX) 40 MG tablet Take 1 tablet (40 mg total) by mouth daily. 07/14/18   [name], MD  QUEtiapine (SEROQUEL) 100 MG tablet TAKE HALF A TABLET IN THE MORNING AND 1.5 TABLETS IN THE EVENING 11/06/18   [name], MD  spironolactone (ALDACTONE) 50 MG tablet Take 50 mg by mouth daily.    [provider]  warfarin (COUMADIN) 4 MG tablet Take 1 tablet (4 mg total) by mouth daily. Patient taking  differently: Take 4 mg by mouth daily. 2mg  on Fridays 07/14/18   [name], MD    Allergies    Ativan [lorazepam] and Morphine and related  Review of Systems   Review of Systems  Constitutional: Negative for fever.  Gastrointestinal: Negative for bowel incontinence.  Genitourinary: Negative for bladder incontinence.  Musculoskeletal: Positive for back pain.  All other systems reviewed and are negative.   Physical Exam Updated Vital Signs BP 117/61 (BP Location: Left Arm)   Pulse (!) 58   Temp 98.9 F (37.2 C) (Oral)   Resp 18   Ht 5\' 10"  (1.778 m)   Wt 136.1 kg   SpO2 99%   BMI 43.05 kg/m   Physical Exam Vitals and nursing note reviewed.  Constitutional:      General: He is not in acute distress.    Appearance: He is well-developed. He is not diaphoretic.  HENT:     Head: Normocephalic and atraumatic.  Cardiovascular:     Rate and Rhythm: Normal rate and regular rhythm.     Heart sounds: No murmur. No friction rub.  Pulmonary:     Effort: Pulmonary effort is normal. No respiratory distress.     Breath sounds: Normal breath sounds. No wheezing or rales.  Abdominal:     General: Bowel sounds are normal. There is no distension.     Palpations: Abdomen is soft.     Tenderness: There is no abdominal tenderness.    Musculoskeletal:     Cervical back: Normal range of motion and neck supple.  Skin:    General: Skin is warm and dry.  Neurological:     Mental Status: He is alert and oriented to person, place, and time.     Coordination: Coordination normal.     ED Results / Procedures / Treatments   Labs (all labs ordered are listed, but only abnormal results are displayed) Labs Reviewed  BASIC METABOLIC PANEL - Abnormal; Notable for the following components:      Result Value   Glucose, Bld 104 (*)    Creatinine, Ser 1.64 (*)    Calcium 8.4 (*)    GFR calc non Af Amer 42 (*)    GFR calc Af Amer 49 (*)    All other components within normal limits  CBC WITH DIFFERENTIAL/PLATELET    EKG None  Radiology CT Lumbar Spine Wo Contrast  Result Date: 06/04/2019 CLINICAL DATA:  Low back pain secondary to a fall from bed 7 days ago. EXAM: CT LUMBAR SPINE WITHOUT CONTRAST TECHNIQUE: Multidetector CT imaging of the lumbar spine was performed without intravenous contrast administration. Multiplanar CT image reconstructions were also generated. COMPARISON:  Lumbar MRI dated 02/15/2019 FINDINGS: Segmentation: 5 lumbar type vertebrae. Alignment: Normal. Vertebrae: There is a new Schmorl's node in the superior endplate of L5 with new gas in the disc space without disc space narrowing. There is an old stable compression fracture of the superior endplate of L1, unchanged since 05/20/2018. There is also an old mild compression fracture of the superior endplate of L3, also unchanged since 05/20/2018. There is moderate facet arthritis at L3-4, right greater than left. Minimal degenerative changes of the facet joints at L4-5. Paraspinal and other soft tissues: Aortic atherosclerosis. Otherwise negative. Disc levels: T11-12: Negative. T12-L1: No disc bulging or protrusion. Old mild compression fracture of the superior endplate of L1 with no protrusion of bone or disc into the spinal canal. L1-2: Normal. L2-3: Old healed  mild compression fracture of the superior  endplate of L3. Small partially calcified disc protrusion into the left neural foramen with a small calcified disc bulge into the right neural foramen. The nerves exit without impingement. Slight bilateral facet arthritis and hypertrophy of the ligamentum flavum which combine to create moderately severe spinal stenosis, best seen on image 63 of series 4. L3-4: Small broad-based disc bulge. Hypertrophy of the ligamentum flavum and facet joints combine to create mild spinal stenosis. No foraminal stenosis. L4-5: No discrete disc bulging or protrusion. New degenerative disc disease with a vacuum phenomenon in the disc. New Schmorl's node in the superior endplate of L5. L5-S1: Small central disc bulge with calcification. No focal neural impingement. Widely patent neural foramina. IMPRESSION: 1. New Schmorl's node in the superior endplate of L5. No other acute abnormality of the lumbar spine. 2. Old healed compression fractures of L1 and L3. 3. Moderately severe spinal stenosis at L2-3. Aortic Atherosclerosis (ICD10-I70.0). Electronically Signed   By: Lorriane Shire M.D.   On: 06/04/2019 14:14    Procedures Procedures (including critical care time)  Medications Ordered in ED Medications  HYDROmorphone (DILAUDID) injection 2 mg (2 mg Intramuscular Given 06/04/19 1319)    ED Course  I have reviewed the triage vital signs and the nursing notes.  Pertinent labs & imaging results that were available during my care of the patient were reviewed by me and considered in my medical decision making (see chart for details).    MDM Rules/Calculators/A&P  Patient with history of Dementia, compression fractures with chronic back pain.  He presents with worsening of this back pain that is unrelieved with the hydrocodone he has ordered at the nursing facility.  His daughter is concerned that his pain medicine is wearing off before additional doses are ordered.  Patient is  neurologically intact and CT scan shows no evidence for acute fracture.  I see no indication for inpatient treatment.  Patient is feeling significantly improved after an IM dose of Dilaudid.  I will change the patient's hydrocodone to oxycodone and have him try this to see if this works better.  If so, additional medication changes can be made at his assisted living facility.  Final Clinical Impression(s) / ED Diagnoses Final diagnoses:  None    Rx / DC Orders ED Discharge Orders    None       Veryl Speak, MD 06/04/19 1525
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
## 2019-06-04 NOTE — Discharge Instructions (Addendum)
Begin taking oxycodone as prescribed.  Follow-up with primary doctor in the next few days, and return to the ER if symptoms significantly worsen or change.

## 2019-06-07 DIAGNOSIS — Z03818 Encounter for observation for suspected exposure to other biological agents ruled out: Secondary | ICD-10-CM | POA: Diagnosis not present

## 2019-06-08 ENCOUNTER — Other Ambulatory Visit: Payer: Self-pay | Admitting: *Deleted

## 2019-06-08 DIAGNOSIS — S32010S Wedge compression fracture of first lumbar vertebra, sequela: Secondary | ICD-10-CM

## 2019-06-08 NOTE — Telephone Encounter (Signed)
I talked to Dr Truman Hayward (continuity clinic today) - stated he will review and call daughter later.

## 2019-06-08 NOTE — Telephone Encounter (Signed)
Call from pt's daughter, [name], stated pt is having a lot of pain; he went to ED on Friday, given rx for Oxycodone. She believes it is too strong for him; worsening his dementia. She's requesting Hydrocodone but q 6 hrs instead of q 12h. Last rx written 2/16 (Print not electronically sent). Thanks
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
## 2019-06-09 DIAGNOSIS — Z03818 Encounter for observation for suspected exposure to other biological agents ruled out: Secondary | ICD-10-CM | POA: Diagnosis not present

## 2019-06-09 DIAGNOSIS — E1151 Type 2 diabetes mellitus with diabetic peripheral angiopathy without gangrene: Secondary | ICD-10-CM | POA: Diagnosis not present

## 2019-06-09 DIAGNOSIS — L84 Corns and callosities: Secondary | ICD-10-CM | POA: Diagnosis not present

## 2019-06-10 ENCOUNTER — Telehealth: Payer: Self-pay | Admitting: *Deleted

## 2019-06-10 DIAGNOSIS — I69311 Memory deficit following cerebral infarction: Secondary | ICD-10-CM | POA: Diagnosis not present

## 2019-06-10 DIAGNOSIS — M6281 Muscle weakness (generalized): Secondary | ICD-10-CM | POA: Diagnosis not present

## 2019-06-10 DIAGNOSIS — R2681 Unsteadiness on feet: Secondary | ICD-10-CM | POA: Diagnosis not present

## 2019-06-10 DIAGNOSIS — I110 Hypertensive heart disease with heart failure: Secondary | ICD-10-CM | POA: Diagnosis not present

## 2019-06-10 DIAGNOSIS — I69398 Other sequelae of cerebral infarction: Secondary | ICD-10-CM | POA: Diagnosis not present

## 2019-06-10 DIAGNOSIS — I5032 Chronic diastolic (congestive) heart failure: Secondary | ICD-10-CM | POA: Diagnosis not present

## 2019-06-10 NOTE — Addendum Note (Signed)
Addended by: [name] on: 06/10/2019 02:23 PM   Modules accepted: Orders
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
## 2019-06-12 DIAGNOSIS — I69311 Memory deficit following cerebral infarction: Secondary | ICD-10-CM | POA: Diagnosis not present

## 2019-06-12 DIAGNOSIS — E119 Type 2 diabetes mellitus without complications: Secondary | ICD-10-CM | POA: Diagnosis not present

## 2019-06-12 DIAGNOSIS — Z7901 Long term (current) use of anticoagulants: Secondary | ICD-10-CM | POA: Diagnosis not present

## 2019-06-12 DIAGNOSIS — Z794 Long term (current) use of insulin: Secondary | ICD-10-CM | POA: Diagnosis not present

## 2019-06-12 DIAGNOSIS — I110 Hypertensive heart disease with heart failure: Secondary | ICD-10-CM | POA: Diagnosis not present

## 2019-06-12 DIAGNOSIS — F0391 Unspecified dementia with behavioral disturbance: Secondary | ICD-10-CM | POA: Diagnosis not present

## 2019-06-12 DIAGNOSIS — I5032 Chronic diastolic (congestive) heart failure: Secondary | ICD-10-CM | POA: Diagnosis not present

## 2019-06-12 DIAGNOSIS — I69398 Other sequelae of cerebral infarction: Secondary | ICD-10-CM | POA: Diagnosis not present

## 2019-06-12 DIAGNOSIS — R2681 Unsteadiness on feet: Secondary | ICD-10-CM | POA: Diagnosis not present

## 2019-06-12 DIAGNOSIS — Z87891 Personal history of nicotine dependence: Secondary | ICD-10-CM | POA: Diagnosis not present

## 2019-06-12 DIAGNOSIS — J449 Chronic obstructive pulmonary disease, unspecified: Secondary | ICD-10-CM | POA: Diagnosis not present

## 2019-06-12 DIAGNOSIS — M6281 Muscle weakness (generalized): Secondary | ICD-10-CM | POA: Diagnosis not present

## 2019-06-13 DIAGNOSIS — R69 Illness, unspecified: Secondary | ICD-10-CM | POA: Diagnosis not present

## 2019-06-13 DIAGNOSIS — R5381 Other malaise: Secondary | ICD-10-CM | POA: Diagnosis not present

## 2019-06-15 ENCOUNTER — Ambulatory Visit (INDEPENDENT_AMBULATORY_CARE_PROVIDER_SITE_OTHER): Payer: Medicare Other | Admitting: Internal Medicine

## 2019-06-15 ENCOUNTER — Encounter: Payer: Self-pay | Admitting: Internal Medicine

## 2019-06-15 VITALS — BP 116/56 | HR 73 | Temp 98.1°F | Ht 71.0 in | Wt 305.1 lb

## 2019-06-15 DIAGNOSIS — Z7901 Long term (current) use of anticoagulants: Secondary | ICD-10-CM | POA: Diagnosis not present

## 2019-06-15 DIAGNOSIS — Z794 Long term (current) use of insulin: Secondary | ICD-10-CM

## 2019-06-15 DIAGNOSIS — I5030 Unspecified diastolic (congestive) heart failure: Secondary | ICD-10-CM

## 2019-06-15 DIAGNOSIS — D509 Iron deficiency anemia, unspecified: Secondary | ICD-10-CM

## 2019-06-15 DIAGNOSIS — B182 Chronic viral hepatitis C: Secondary | ICD-10-CM

## 2019-06-15 DIAGNOSIS — E119 Type 2 diabetes mellitus without complications: Secondary | ICD-10-CM

## 2019-06-15 DIAGNOSIS — K7460 Unspecified cirrhosis of liver: Secondary | ICD-10-CM | POA: Diagnosis not present

## 2019-06-15 DIAGNOSIS — F1190 Opioid use, unspecified, uncomplicated: Secondary | ICD-10-CM | POA: Diagnosis not present

## 2019-06-15 DIAGNOSIS — S32010S Wedge compression fracture of first lumbar vertebra, sequela: Secondary | ICD-10-CM

## 2019-06-15 DIAGNOSIS — I38 Endocarditis, valve unspecified: Secondary | ICD-10-CM | POA: Diagnosis not present

## 2019-06-15 DIAGNOSIS — F0281 Dementia in other diseases classified elsewhere with behavioral disturbance: Secondary | ICD-10-CM | POA: Diagnosis not present

## 2019-06-15 DIAGNOSIS — F02818 Dementia in other diseases classified elsewhere, unspecified severity, with other behavioral disturbance: Secondary | ICD-10-CM

## 2019-06-15 LAB — POCT GLYCOSYLATED HEMOGLOBIN (HGB A1C): Hemoglobin A1C: 7 % — AB (ref 4.0–5.6)

## 2019-06-15 LAB — GLUCOSE, CAPILLARY: Glucose-Capillary: 90 mg/dL (ref 70–99)

## 2019-06-15 MED ORDER — LACTULOSE 10 GM/15ML PO SOLN: 10.0000 g | Freq: Two times a day (BID) | ORAL | 3 refills | Status: DC

## 2019-06-15 MED ORDER — HYDROCODONE-ACETAMINOPHEN 5-325 MG PO TABS: 2.0000 | ORAL_TABLET | Freq: Four times a day (QID) | ORAL | 0 refills | Status: DC | PRN

## 2019-06-15 NOTE — Addendum Note (Signed)
Addended by: [name] on: 06/15/2019 03:39 PM   Modules accepted: Orders

## 2019-06-15 NOTE — Progress Notes (Signed)
CC: Sun-downing  HPI: [name] is a 69 y.o. M w/ PMH of Cirrhosis, Dementia, IDDM, HFpEF, Aortic valve s/p mechanical valve replacement on chronic anticoagulation and chronic opioid use due to lumbar spinal stenosis presenting with daughter for recurrent falls and night-time confusion. [name] is AAOx3 on examination and states that he has difficulty remembering his episodes of agitation and confusion at nighttime. He mentions that his biggest complaint is his back pain which is alleviated by his pain medication but is otherwise stable. He has no trouble walking but he wakes up on the floor in the morning with bruising noted on multiple sites. His daughter provides supplemental history with episodes of night time confusion and agitation, recalling one recent episode where he was observed banging on his room window at his assisted living facility and screaming out for help. She mentions that he has had previously been doing better with physical therapy but after evidence of recent COVID infections and him meeting his physical therapy goal, he has mostly been staying in bed. Over the past month, there has been at least 3-4 episodes of falling from his bed. She mentions that the facility does not allow for bed rails due to safety concerns but they would accommodate hospital beds.  Past Medical History:  Diagnosis Date  . COPD (chronic obstructive pulmonary disease) ([place])   . Dementia ([place])   . Depression   . Diabetes mellitus without complication ([place])   . GERD (gastroesophageal reflux disease)   . H/O diverticulitis of colon 11/03/2012   History of partial colectomy in 2011.  [place] Hepatitis C   . High cholesterol   . Lower extremity edema   . Mechanical heart valve present   . Overdose of muscle relaxant 08/11/2017  . Pre-diabetes   . Retroperitoneal hematoma 01/29/2018  . S/P AVR (aortic valve replacement)   . Seizures ([place])   . 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  HENT: Positive for hearing loss.   Respiratory: Negative for cough and shortness of breath.   Cardiovascular: Negative for chest pain and palpitations.  Gastrointestinal: Negative for blood in stool, constipation, diarrhea, melena, nausea and vomiting.  Musculoskeletal: Positive for back pain and falls.  Neurological: Positive for weakness. Negative for sensory change.     Physical Exam: Vitals:   06/15/19 1534  BP: (!) 116/56  Pulse: 73  Temp: 98.1 F (36.7 C)  TempSrc: Oral  SpO2: 99%  Weight: (!) 305 lb 1.6 oz (138.4 kg)  Height: '5\' 11"'  (1.803 m)    Physical Exam  Constitutional: He is oriented to person, place, and time. He appears well-developed and well-nourished. No distress.  HENT:  Mouth/Throat: Oropharynx is clear and moist.  Eyes: No scleral icterus.  Conjunctival pallor  Cardiovascular: Normal rate, regular rhythm, normal heart sounds and intact distal pulses.  No murmur heard. Respiratory: Effort normal and breath sounds normal. He has no wheezes. He has no rales.  GI: Soft. Bowel sounds are normal. He exhibits no distension. There is no abdominal tenderness.  Musculoskeletal:        General: Tenderness (paraspinal tenderness around L3-L5 without erythema, edema.) and edema (Trace pitting edema bilateral lowre extremities) present. Normal range of motion.     Cervical back: Normal range of motion and neck supple.  Neurological: He is alert and oriented to person, place, and time. No cranial nerve deficit.  No focal neurological deficits  Skin: Skin is warm and dry. There is pallor.  Multiple areas of ecchymosis on back and arm consistent with points of contact from fall  Psychiatric: He has a normal mood and affect. His behavior is normal.     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 Weight up to 305lb from previous weight of 303lbs. Weight log from facility reviewed with steady fluctutations in weight from 303 to 305lbs. On exam, no evidence of rales or respiratory distress. Mild trace edema. Per history, appear to be weight gain from sedentary lifestyle. No evidence of acute exacerbation of heart failure. Currently on furosemide 40 bid, spironolactone 50 daily. Will check electrolytes and continue current diuretic regimen.  - C/w furosemide 40 BID, spironolactone 50 daily - CMP today  Hepatic cirrhosis due to chronic hepatitis C infection ([place]) S/p treatment with negative hep C on viral load. Follows with Dr.Nandigam with GI and Dr.Comer with ID. Currently on lactulose 10g daily. Mentions having 1 bowel movement daily. No asterixis on exam. But decreased frequency of bowel movement compared to prior. Likely in part due to increased opioid frequency. [place] screening done with [place] on 03/2019 w/o evidence of HCC.  - Increase lactulose to BID  - CMP today - F/u with Dr.Nandigam  Type II diabetes mellitus (HCC) Currently on insulin 28 units qhs, Novolog 10 units TID. Denies any hypoglycemic events. Glucometer log from ALF reviewed with consistent range 120-140s. Hgb a1c improved from 7.5->7.0.  - C/w current therapy (Levemir 28 units qhs, Novolog 10 units TID qc) - Glucose monitoring  Dementia associated with other underlying disease with behavioral disturbance (HCC) Over the last month, had multiple episodes of worsening agitation and confusion, especially during night time. He met goals for physical therapy couple months ago and no longer has home health PT and due to [place] at the facility, have been restricted to his room with minimal physical activity. Also has been having multiple falls (>3 times in last 2 weeks) especially during night time. He also has significant bleeding risk w/ hx of cirrhosis and chronic anticoagulation due to mechanical valve. On exam, alert and  oriented x3 today with appropriate affect and ability to follow directions appropriately. Appears to be due to sundowning. Would benefit from increased physical activity and social contact which is unfortunately complicated by COVID pandemic.  - Will re-send referral for physical therapy as he has now had recurrent falls and would benefit from re-evaluation - Will discuss with [facility] staff regarding importance of re-direction - C/w current regimen: memantine 23m - F/u with neuro-psych, Dr.Aquino  Microcytic anemia During recent ED visit, found to have microcytic anemia on cbc. He denies any bleeding events. No melena, hematemesis, BRBPR or hemoptysis but on exam has significant pallor. Has multiple areas of ecchymosis after recent falls but no evidence of active bleed. He is at high risk for upper GI bleed as well due to cirrhosis and per chart review, Dr.Nandigam planning for screening EGD. Will check cbc today and if worsening, will need to have more urgent EGD.  - Check cbc, iron panel - Monitor for bleed  Addendum: Cbc shows improving hemoglobin but iron panel shows significant iron deficiency. Will schedule for outpatient iron infusion as oral iron can exacerbate opioid induced constipation and cause hepatic encephalopathy as well.  Compression fracture of L1 lumbar vertebra (HCC) Presents for management of chronic back pain currently on chronic opioid therapy. Had  recent exacerbation after fall from bed requiring ED visit. Previously on hydrocodone-acetaminophen 5-360m q12 hrs at #60 per 30 days and was well controlled but had exacerbations due to falls. Prescribed oxycodone in ED with significant side effects of increased agitation and confusion. Imaging shows well healing compression fracture and new Schmorl's node of lumbar spine. Oxycodone discontinued and hydrocodone frequency increased. Discussed need to f/u with Dr.Keeling, his orthopedist, if his symptoms worsen.    Adam Savage is on chronic opioid therapy for chronic pain from lumbar compression fracture. As part of the treatment plan, the [place] controlled substance database is checked at least twice yearly and the database results was checked 06/15/19 and was found to not be updated properly but [place] and was confirmed have appropriate monthly dispense history.   Pain contract was not signed due to dementia but per discussion with daughter, ACaryl Pina patient and his caretaker understands commitment to avoid excessive opioid use and only relying on pain medications to pursue goal of participating in social interactions with his daughter and grandkids.  The patient is on hydrocodone-acetaminophen 5-3031mq6hrs, 60 per 30 days. Adjunctive treatment includes home exercise. This regimen allows RiFlorian Savage function without excessive sedation or side effects. The benefits of continuing opioid therapy outweigh the risks and chronic opioids will be continued. Ongoing education about safe opioid treatment is provided.    - Re-evaluation by PT after recurrent falls - F/u with Dr.Keeling for Schmorl's node treatment if refractory to conservative management - C/w pain management  Chronic, continuous use of opioids Refer to A&P under compression fracture for further detail    Patient discussed with Dr. MuDaryll Drown -JoGilberto Better[facility] Pager: [phone]

## 2019-06-15 NOTE — Patient Instructions (Addendum)
Dear [name],  Thank you for allowing [place] to provide your care today. Today we discussed your falls    I have ordered cbc, cmp, and iron panel labs for you. I will call if any are abnormal.    Today we made the following changes to your medications:    Please increase lactulose frequency to twice daily  Please follow-up in 3 months.    Should you have any questions or concerns please call the internal medicine clinic at [phone].    Thank you for choosing [facility].   Opioid Pain Medicine Management Opioid pain medicines are strong medicines that are used to treat bad or very bad pain. When you take them for a short time, they can help you:  Sleep better.  Do better in physical therapy.  Feel better during the first few days after you get hurt.  Recover from surgery. Only take these medicines if a doctor says that you can. You should only take them for a short time. This is because opioids can be hard to stop taking (they are addictive). The longer you take opioids, the harder it may be to stop taking them (opioid use disorder). What are the risks? Opioids can cause problems (side effects). Taking them for more than 3 days raises your chance of problems, such as:  Trouble pooping (constipation).  Feeling sick to your stomach (nausea).  Vomiting.  Feeling very sleepy.  Confusion.  Not being able to stop taking the medicine.  Breathing problems. Taking opioids for a long time can make it hard for you to do daily tasks. It can also put you at risk for:  Car accidents.  Depression.  Suicide.  Heart attack.  Taking too much of the medicine (overdose), which can sometimes lead to death. What is a pain treatment plan? A pain treatment plan is a plan made by you and your doctor. Work with your doctor to make a plan for treating your pain. To help you do this:  Talk about the goals of your treatment, including: ? How much pain you might expect to  have. ? How you will manage the pain.  Talk about the risks and benefits of taking these medicines for your condition.  Remember that a good treatment plan uses more than one approach and lowers the risks of side effects.  Tell your doctor about the amount of medicines you take and about any drug or alcohol use.  Get your pain medicine prescriptions from only one doctor. Pain can be managed with other treatments. Work with your doctor to find other ways to help your pain, such as:  Physical therapy.  Counseling.  Eating healthy foods.  Brain exercises.  Massage.  Meditation.  Other pain medicines.  Doing gentle exercises. Tapering your use of opioids If you have been taking opioids for more than a few weeks, you may need to slowly decrease (taper) how much you take until you stop taking them. Doing this can lower your chance of having symptoms, such as:  Pain and cramping in your belly (abdomen).  Feeling sick to your stomach.  Sweating.  Feeling very sleepy.  Feeling restless.  Shaking you cannot control (tremors).  Cravings for the medicine. Do not try to stop taking them by yourself. Work with your doctor to stop. Your doctor will help you take less until you are not taking the medicine at all. Follow these instructions at home: Safety and storage   While you are taking opioids: ?  Do not drive. ? Do not use machines or power tools. ? Do not sign important papers (legal documents). ? Do not drink alcohol. ? Do not take sleeping pills. ? Do not take care of children by yourself. ? Do not do activities where you need to climb or be in high places, like working on a ladder. ? Do not go into any water, such as a lake, river, ocean, swimming pool, or hot tub.  Keep your opioids locked up or in a place where children cannot reach them.  Do not share your pain medicine with anyone. Getting rid of leftover pills Do not save any leftover pills. Get rid of  leftover pills safely by:  Taking them to a take-back program in your area.  Bringing them to a pharmacy that has a container for throwing away pills (pill disposal).  Throwing them in the trash. Check the label or package insert of your medicine to see whether this is safe to do. If it is safe to throw them out: 1. Take the pills out of their container. 2. Mix the pills with pet poop or food scraps. 3. Put this in the trash. Activity  Return to your normal activities as told by your doctor. Ask your doctor what activities are safe for you.  Avoid doing things that make your pain worse.  Do exercises as told by your doctor. General instructions  You may need to take these actions to prevent or treat trouble pooping: ? Drink enough fluid to keep your pee (urine) pale yellow. ? Take over-the-counter or prescription medicines. ? Eat foods that are high in fiber. These include beans, whole grains, and fresh fruits and vegetables. ? Limit foods that are high in fat and sugar. These include fried or sweet foods.  Keep all follow-up visits as told by your doctor. This is important. Where to find support If you have been taking opioids for a long time, think about getting help quitting from a local support group or counselor. Ask your doctor about this. Where to find more information Centers for Disease Control and Prevention (CDC): http://www.wolf.info/ Get help right away if: Seek medical care right away if you are taking opioids and you, or people close to you, notice any of the following:  You have trouble breathing.  Your breathing is slower or more shallow than normal.  You have a very slow heartbeat.  You feel very confused.  You pass out (faint).  You are very sleepy.  Your speech is not normal.  You feel sick to your stomach and vomit.  You have cold skin.  You have blue lips or fingernails.  Your muscles are weak (limp) and your body seems floppy.  The black centers of  your eyes (pupils) are smaller than normal. If you think that you or someone else may have taken too much of an opioid medicine, get medical help right away. Call your local emergency services (911 in the [place] Do not drive yourself to the hospital. If you ever feel like you may hurt yourself or others, or have thoughts about taking your own life, get help right away. You can go to your nearest emergency department or call:  Your local emergency services (911 in the [place]  The hotline of the [facility] 530 651 2803 in the [place].).  A suicide crisis helpline, such as the [place] at 7091460158. This is open 24 hours a day. Summary  Opioid are strong medicines that are  used to treat bad or very bad pain.  A pain treatment plan is a plan made by you and your doctor. Work with your doctor to make a plan for treating your pain.  Work with your doctor to find other ways to help your pain.  If you think that you or someone else may have taken too much of an opioid, get help right away. This information is not intended to replace advice given to you by your health care provider. Make sure you discuss any questions you have with your health care provider. Document Revised: 05/01/2018 Document Reviewed: 05/01/2018 Elsevier Patient Education  [place].

## 2019-06-16 ENCOUNTER — Ambulatory Visit (INDEPENDENT_AMBULATORY_CARE_PROVIDER_SITE_OTHER): Payer: Medicare Other | Admitting: *Deleted

## 2019-06-16 DIAGNOSIS — I110 Hypertensive heart disease with heart failure: Secondary | ICD-10-CM | POA: Diagnosis not present

## 2019-06-16 DIAGNOSIS — Z952 Presence of prosthetic heart valve: Secondary | ICD-10-CM | POA: Diagnosis not present

## 2019-06-16 DIAGNOSIS — I69311 Memory deficit following cerebral infarction: Secondary | ICD-10-CM | POA: Diagnosis not present

## 2019-06-16 DIAGNOSIS — Z5181 Encounter for therapeutic drug level monitoring: Secondary | ICD-10-CM

## 2019-06-16 DIAGNOSIS — I69398 Other sequelae of cerebral infarction: Secondary | ICD-10-CM | POA: Diagnosis not present

## 2019-06-16 DIAGNOSIS — R2681 Unsteadiness on feet: Secondary | ICD-10-CM | POA: Diagnosis not present

## 2019-06-16 DIAGNOSIS — M6281 Muscle weakness (generalized): Secondary | ICD-10-CM | POA: Diagnosis not present

## 2019-06-16 DIAGNOSIS — I5032 Chronic diastolic (congestive) heart failure: Secondary | ICD-10-CM | POA: Diagnosis not present

## 2019-06-16 LAB — CMP14 + ANION GAP
ALT: 16 IU/L (ref 0–44)
AST: 28 IU/L (ref 0–40)
Albumin/Globulin Ratio: 1.1 — ABNORMAL LOW (ref 1.2–2.2)
Albumin: 3.9 g/dL (ref 3.8–4.8)
Alkaline Phosphatase: 208 IU/L — ABNORMAL HIGH (ref 39–117)
Anion Gap: 14 mmol/L (ref 10.0–18.0)
BUN/Creatinine Ratio: 17 (ref 10–24)
BUN: 22 mg/dL (ref 8–27)
Bilirubin Total: 0.7 mg/dL (ref 0.0–1.2)
CO2: 26 mmol/L (ref 20–29)
Calcium: 8.8 mg/dL (ref 8.6–10.2)
Chloride: 97 mmol/L (ref 96–106)
Creatinine, Ser: 1.31 mg/dL — ABNORMAL HIGH (ref 0.76–1.27)
GFR calc Af Amer: 64 mL/min/{1.73_m2} (ref >59)
GFR calc non Af Amer: 55 mL/min/{1.73_m2} — ABNORMAL LOW (ref >59)
Globulin, Total: 3.7 g/dL (ref 1.5–4.5)
Glucose: 90 mg/dL (ref 65–99)
Potassium: 4.1 mmol/L (ref 3.5–5.2)
Sodium: 137 mmol/L (ref 134–144)
Total Protein: 7.6 g/dL (ref 6.0–8.5)

## 2019-06-16 LAB — CBC WITH DIFFERENTIAL/PLATELET
Basophils Absolute: 0.1 10*3/uL (ref 0.0–0.2)
Basos: 1 %
EOS (ABSOLUTE): 0.1 10*3/uL (ref 0.0–0.4)
Eos: 2 %
Hematocrit: 30.5 % — ABNORMAL LOW (ref 37.5–51.0)
Hemoglobin: 9.4 g/dL — ABNORMAL LOW (ref 13.0–17.7)
Immature Grans (Abs): 0 10*3/uL (ref 0.0–0.1)
Immature Granulocytes: 0 %
Lymphocytes Absolute: 1.5 10*3/uL (ref 0.7–3.1)
Lymphs: 18 %
MCH: 22.5 pg — ABNORMAL LOW (ref 26.6–33.0)
MCHC: 30.8 g/dL — ABNORMAL LOW (ref 31.5–35.7)
MCV: 73 fL — ABNORMAL LOW (ref 79–97)
Monocytes Absolute: 0.8 10*3/uL (ref 0.1–0.9)
Monocytes: 10 %
Neutrophils Absolute: 5.5 10*3/uL (ref 1.4–7.0)
Neutrophils: 69 %
Platelets: 131 10*3/uL — ABNORMAL LOW (ref 150–450)
RBC: 4.18 x10E6/uL (ref 4.14–5.80)
RDW: 14.8 % (ref 11.6–15.4)
WBC: 7.9 10*3/uL (ref 3.4–10.8)

## 2019-06-16 LAB — POCT INR: INR: 3.1 — AB (ref 2.0–3.0)

## 2019-06-16 LAB — IRON,TIBC AND FERRITIN PANEL
Ferritin: 20 ng/mL — ABNORMAL LOW (ref 30–400)
Iron Saturation: 4 % — CL (ref 15–55)
Iron: 20 ug/dL — ABNORMAL LOW (ref 38–169)
Total Iron Binding Capacity: 457 ug/dL — ABNORMAL HIGH (ref 250–450)
UIBC: 437 ug/dL — ABNORMAL HIGH (ref 111–343)

## 2019-06-16 NOTE — Patient Instructions (Signed)
Take warfarin 2mg  tonight then resume 4mg  daily. Moved to [facility] 7/30. Started Harvoni 11/19/18 (can decrease INR).  Recheck INR in 1 week. Order given to [employer] [facility] and faxed to [place] [phone]

## 2019-06-17 ENCOUNTER — Telehealth: Payer: Self-pay | Admitting: *Deleted

## 2019-06-17 ENCOUNTER — Other Ambulatory Visit: Payer: Self-pay | Admitting: Internal Medicine

## 2019-06-17 ENCOUNTER — Telehealth: Payer: Self-pay | Admitting: Internal Medicine

## 2019-06-17 DIAGNOSIS — D509 Iron deficiency anemia, unspecified: Secondary | ICD-10-CM

## 2019-06-17 DIAGNOSIS — F1190 Opioid use, unspecified, uncomplicated: Secondary | ICD-10-CM | POA: Insufficient documentation

## 2019-06-17 DIAGNOSIS — S32010S Wedge compression fracture of first lumbar vertebra, sequela: Secondary | ICD-10-CM

## 2019-06-17 NOTE — Assessment & Plan Note (Signed)
During recent ED visit, found to have microcytic anemia on cbc. He denies any bleeding events. No melena, hematemesis, BRBPR or hemoptysis but on exam has significant pallor. Has multiple areas of ecchymosis after recent falls but no evidence of active bleed. He is at high risk for upper GI bleed as well due to cirrhosis and per chart review, Dr.Nandigam planning for screening EGD. Will check cbc today and if worsening, will need to have more urgent EGD.  - Check cbc, iron panel - Monitor for bleed  Addendum: Cbc shows improving hemoglobin but iron panel shows significant iron deficiency. Will schedule for outpatient iron infusion as oral iron can exacerbate opioid induced constipation and cause hepatic encephalopathy as well.

## 2019-06-17 NOTE — Telephone Encounter (Signed)
I'll fax it over later today or early tomorrow. Thank you.

## 2019-06-17 NOTE — Assessment & Plan Note (Signed)
Presents for management of chronic back pain currently on chronic opioid therapy. Had recent exacerbation after fall from bed requiring ED visit. Previously on hydrocodone-acetaminophen 5-300mg  q12 hrs at #60 per 30 days and was well controlled but had exacerbations due to falls. Prescribed oxycodone in ED with significant side effects of increased agitation and confusion. Imaging shows well healing compression fracture and new Schmorl's node of lumbar spine. Oxycodone discontinued and hydrocodone frequency increased. Discussed need to f/u with Dr.Keeling, his orthopedist, if his symptoms worsen.    Adam Savage is on chronic opioid therapy for chronic pain from lumbar compression fracture. As part of the treatment plan, the [place] controlled substance database is checked at least twice yearly and the database results was checked 06/15/19 and was found to not be updated properly but [place] was contacted and was confirmed have appropriate monthly dispense history.   Pain contract was not signed due to dementia but per discussion with daughter, Caryl Pina, patient and his caretaker understands commitment to avoid excessive opioid use and only relying on pain medications to pursue goal of participating in social interactions with his daughter and grandkids.  The patient is on hydrocodone-acetaminophen 5-300mg  q6hrs, 60 per 30 days. Adjunctive treatment includes home exercise. This regimen allows Adam Savage to function without excessive sedation or side effects. The benefits of continuing opioid therapy outweigh the risks and chronic opioids will be continued. Ongoing education about safe opioid treatment is provided.    - Re-evaluation by PT after recurrent falls - F/u with Dr.Keeling for Schmorl's node treatment if refractory to conservative management - C/w pain management

## 2019-06-17 NOTE — Assessment & Plan Note (Addendum)
S/p treatment with negative hep C on viral load. Follows with Dr.Nandigam with GI and Dr.Comer with ID. Currently on lactulose 10g daily. Mentions having 1 bowel movement daily. No asterixis on exam. But decreased frequency of bowel movement compared to prior. Likely in part due to increased opioid frequency. [place] screening done with [place] on 03/2019 w/o evidence of HCC.  - Increase lactulose to BID  - CMP today - F/u with Dr.Nandigam

## 2019-06-17 NOTE — Assessment & Plan Note (Addendum)
Over the last month, had multiple episodes of worsening agitation and confusion, especially during night time. He met goals for physical therapy couple months ago and no longer has home health PT and due to [place] at the facility, have been restricted to his room with minimal physical activity. Also has been having multiple falls (>3 times in last 2 weeks) especially during night time. He also has significant bleeding risk w/ hx of cirrhosis and chronic anticoagulation due to mechanical valve. On exam, alert and oriented x3 today with appropriate affect and ability to follow directions appropriately. Appears to be due to sundowning. Would benefit from increased physical activity and social contact which is unfortunately complicated by [place] pandemic.  - Will re-send referral for physical therapy as he has now had recurrent falls and would benefit from re-evaluation - Will discuss with [facility] staff regarding importance of re-direction - C/w current regimen: memantine 92m - F/u with neuro-psych, Dr.Aquino

## 2019-06-17 NOTE — Assessment & Plan Note (Signed)
Presents for chronic management of chronic diastolic heart failure. Weight up to 305lb from previous weight of 303lbs. Weight log from facility reviewed with steady fluctutations in weight from 303 to 305lbs. On exam, no evidence of rales or respiratory distress. Mild trace edema. Per history, appear to be weight gain from sedentary lifestyle. No evidence of acute exacerbation of heart failure. Currently on furosemide 40 bid, spironolactone 50 daily. Will check electrolytes and continue current diuretic regimen.  - C/w furosemide 40 BID, spironolactone 50 daily - CMP today

## 2019-06-17 NOTE — Assessment & Plan Note (Signed)
Currently on insulin 28 units qhs, Novolog 10 units TID. Denies any hypoglycemic events. Glucometer log from ALF reviewed with consistent range 120-140s. Hgb a1c improved from 7.5->7.0.  - C/w current therapy (Levemir 28 units qhs, Novolog 10 units TID qc) - Glucose monitoring

## 2019-06-17 NOTE — Progress Notes (Signed)
Order for hospital bed sent to ALF

## 2019-06-17 NOTE — Assessment & Plan Note (Signed)
Refer to A&P under compression fracture for further detail

## 2019-06-17 NOTE — Progress Notes (Addendum)
Order for iron infusion made and medical day staff contacted. Appointment made for 11am 3/11.

## 2019-06-17 NOTE — Telephone Encounter (Signed)
Caryl Pina, at facility that pt resides in requests an written order for a hospital bed , electric, without bedrails. Please fax the order to [phone] attn Caryl Pina

## 2019-06-17 NOTE — Telephone Encounter (Signed)
Discussed with patient's daughter, Ms.[name] regarding lab values and finding of his iron deficiency and plan to schedule for Iron infusion. All other questions, concerns addressed.

## 2019-06-20 ENCOUNTER — Other Ambulatory Visit: Payer: Self-pay | Admitting: Internal Medicine

## 2019-06-20 DIAGNOSIS — E119 Type 2 diabetes mellitus without complications: Secondary | ICD-10-CM

## 2019-06-20 DIAGNOSIS — Z794 Long term (current) use of insulin: Secondary | ICD-10-CM

## 2019-06-21 ENCOUNTER — Other Ambulatory Visit: Payer: Self-pay | Admitting: *Deleted

## 2019-06-21 DIAGNOSIS — Z794 Long term (current) use of insulin: Secondary | ICD-10-CM

## 2019-06-21 DIAGNOSIS — E119 Type 2 diabetes mellitus without complications: Secondary | ICD-10-CM

## 2019-06-22 MED ORDER — LANCETS MISC: 1.0000 | Freq: Three times a day (TID) | 3 refills | Status: DC

## 2019-06-22 MED ORDER — GLUCOSE BLOOD VI STRP: ORAL_STRIP | 12 refills | Status: DC

## 2019-06-23 ENCOUNTER — Ambulatory Visit (INDEPENDENT_AMBULATORY_CARE_PROVIDER_SITE_OTHER): Payer: Medicare Other | Admitting: *Deleted

## 2019-06-23 DIAGNOSIS — M6281 Muscle weakness (generalized): Secondary | ICD-10-CM | POA: Diagnosis not present

## 2019-06-23 DIAGNOSIS — I69398 Other sequelae of cerebral infarction: Secondary | ICD-10-CM | POA: Diagnosis not present

## 2019-06-23 DIAGNOSIS — I110 Hypertensive heart disease with heart failure: Secondary | ICD-10-CM | POA: Diagnosis not present

## 2019-06-23 DIAGNOSIS — Z952 Presence of prosthetic heart valve: Secondary | ICD-10-CM

## 2019-06-23 DIAGNOSIS — Z5181 Encounter for therapeutic drug level monitoring: Secondary | ICD-10-CM

## 2019-06-23 DIAGNOSIS — I69311 Memory deficit following cerebral infarction: Secondary | ICD-10-CM | POA: Diagnosis not present

## 2019-06-23 DIAGNOSIS — I5032 Chronic diastolic (congestive) heart failure: Secondary | ICD-10-CM | POA: Diagnosis not present

## 2019-06-23 DIAGNOSIS — R2681 Unsteadiness on feet: Secondary | ICD-10-CM | POA: Diagnosis not present

## 2019-06-23 LAB — POCT INR: INR: 2.3 (ref 2.0–3.0)

## 2019-06-23 NOTE — Patient Instructions (Signed)
Continue warfarin 4mg  daily. Moved to [facility] 7/30. Started Harvoni 11/19/18 (can decrease INR).  Recheck INR in 2 weeks. Order given to [employer] [facility] and faxed to [place] [phone]

## 2019-06-24 ENCOUNTER — Other Ambulatory Visit: Payer: Self-pay

## 2019-06-24 ENCOUNTER — Ambulatory Visit (HOSPITAL_COMMUNITY)
Admission: RE | Admit: 2019-06-24 | Discharge: 2019-06-24 | Disposition: A | Discharge status: Home / Self Care | Payer: Medicare Other | Source: Ambulatory Visit | Attending: Internal Medicine | Admitting: Internal Medicine

## 2019-06-24 DIAGNOSIS — D509 Iron deficiency anemia, unspecified: Secondary | ICD-10-CM | POA: Diagnosis not present

## 2019-06-24 MED ORDER — SODIUM CHLORIDE 0.9 % IV SOLN
510.0000 mg | INTRAVENOUS | Status: DC
  Administered 2019-06-24: 510 mg via INTRAVENOUS
  Filled 2019-06-24: qty 510

## 2019-06-24 NOTE — Discharge Instructions (Signed)

## 2019-06-29 NOTE — Progress Notes (Signed)
Internal Medicine Clinic Attending ° °Case discussed with Dr. Lee at the time of the visit.  We reviewed the resident’s history and exam and pertinent patient test results.  I agree with the assessment, diagnosis, and plan of care documented in the resident’s note.  °

## 2019-07-07 ENCOUNTER — Other Ambulatory Visit (HOSPITAL_COMMUNITY): Payer: Self-pay | Admitting: *Deleted

## 2019-07-08 ENCOUNTER — Other Ambulatory Visit: Payer: Self-pay

## 2019-07-08 ENCOUNTER — Ambulatory Visit (INDEPENDENT_AMBULATORY_CARE_PROVIDER_SITE_OTHER): Payer: Medicare Other | Admitting: *Deleted

## 2019-07-08 ENCOUNTER — Encounter (HOSPITAL_COMMUNITY)
Admission: RE | Admit: 2019-07-08 | Discharge: 2019-07-08 | Disposition: A | Discharge status: Home / Self Care | Payer: Medicare Other | Source: Ambulatory Visit | Attending: Internal Medicine | Admitting: Internal Medicine

## 2019-07-08 DIAGNOSIS — Z952 Presence of prosthetic heart valve: Secondary | ICD-10-CM | POA: Diagnosis not present

## 2019-07-08 DIAGNOSIS — R2681 Unsteadiness on feet: Secondary | ICD-10-CM | POA: Diagnosis not present

## 2019-07-08 DIAGNOSIS — I5032 Chronic diastolic (congestive) heart failure: Secondary | ICD-10-CM | POA: Diagnosis not present

## 2019-07-08 DIAGNOSIS — D509 Iron deficiency anemia, unspecified: Secondary | ICD-10-CM | POA: Insufficient documentation

## 2019-07-08 DIAGNOSIS — I69311 Memory deficit following cerebral infarction: Secondary | ICD-10-CM | POA: Diagnosis not present

## 2019-07-08 DIAGNOSIS — M6281 Muscle weakness (generalized): Secondary | ICD-10-CM | POA: Diagnosis not present

## 2019-07-08 DIAGNOSIS — I110 Hypertensive heart disease with heart failure: Secondary | ICD-10-CM | POA: Diagnosis not present

## 2019-07-08 DIAGNOSIS — Z5181 Encounter for therapeutic drug level monitoring: Secondary | ICD-10-CM

## 2019-07-08 DIAGNOSIS — I69398 Other sequelae of cerebral infarction: Secondary | ICD-10-CM | POA: Diagnosis not present

## 2019-07-08 LAB — POCT INR: INR: 3.3 — AB (ref 2.0–3.0)

## 2019-07-08 MED ORDER — SODIUM CHLORIDE 0.9 % IV SOLN
510.0000 mg | INTRAVENOUS | Status: AC
  Administered 2019-07-08: 510 mg via INTRAVENOUS
  Filled 2019-07-08: qty 17

## 2019-07-08 NOTE — Patient Instructions (Signed)
Hold warfarin tonight then resume 4mg  daily. Moved to [facility] 7/30. Started Harvoni 11/19/18 (can decrease INR).  Recheck INR in 1 week Order given to [employer] and faxed to [place] [phone]

## 2019-07-12 DIAGNOSIS — I110 Hypertensive heart disease with heart failure: Secondary | ICD-10-CM | POA: Diagnosis not present

## 2019-07-12 DIAGNOSIS — J449 Chronic obstructive pulmonary disease, unspecified: Secondary | ICD-10-CM | POA: Diagnosis not present

## 2019-07-12 DIAGNOSIS — M6281 Muscle weakness (generalized): Secondary | ICD-10-CM | POA: Diagnosis not present

## 2019-07-12 DIAGNOSIS — F0391 Unspecified dementia with behavioral disturbance: Secondary | ICD-10-CM | POA: Diagnosis not present

## 2019-07-12 DIAGNOSIS — Z7901 Long term (current) use of anticoagulants: Secondary | ICD-10-CM | POA: Diagnosis not present

## 2019-07-12 DIAGNOSIS — I69398 Other sequelae of cerebral infarction: Secondary | ICD-10-CM | POA: Diagnosis not present

## 2019-07-12 DIAGNOSIS — I69311 Memory deficit following cerebral infarction: Secondary | ICD-10-CM | POA: Diagnosis not present

## 2019-07-12 DIAGNOSIS — E119 Type 2 diabetes mellitus without complications: Secondary | ICD-10-CM | POA: Diagnosis not present

## 2019-07-12 DIAGNOSIS — I5032 Chronic diastolic (congestive) heart failure: Secondary | ICD-10-CM | POA: Diagnosis not present

## 2019-07-12 DIAGNOSIS — Z87891 Personal history of nicotine dependence: Secondary | ICD-10-CM | POA: Diagnosis not present

## 2019-07-12 DIAGNOSIS — R2681 Unsteadiness on feet: Secondary | ICD-10-CM | POA: Diagnosis not present

## 2019-07-12 DIAGNOSIS — Z794 Long term (current) use of insulin: Secondary | ICD-10-CM | POA: Diagnosis not present

## 2019-07-13 ENCOUNTER — Ambulatory Visit (INDEPENDENT_AMBULATORY_CARE_PROVIDER_SITE_OTHER): Payer: Medicare Other | Admitting: *Deleted

## 2019-07-13 DIAGNOSIS — Z5181 Encounter for therapeutic drug level monitoring: Secondary | ICD-10-CM | POA: Diagnosis not present

## 2019-07-13 DIAGNOSIS — Z952 Presence of prosthetic heart valve: Secondary | ICD-10-CM

## 2019-07-13 DIAGNOSIS — M6281 Muscle weakness (generalized): Secondary | ICD-10-CM | POA: Diagnosis not present

## 2019-07-13 DIAGNOSIS — I69311 Memory deficit following cerebral infarction: Secondary | ICD-10-CM | POA: Diagnosis not present

## 2019-07-13 DIAGNOSIS — I69398 Other sequelae of cerebral infarction: Secondary | ICD-10-CM | POA: Diagnosis not present

## 2019-07-13 DIAGNOSIS — R2681 Unsteadiness on feet: Secondary | ICD-10-CM | POA: Diagnosis not present

## 2019-07-13 DIAGNOSIS — I110 Hypertensive heart disease with heart failure: Secondary | ICD-10-CM | POA: Diagnosis not present

## 2019-07-13 DIAGNOSIS — I5032 Chronic diastolic (congestive) heart failure: Secondary | ICD-10-CM | POA: Diagnosis not present

## 2019-07-13 LAB — POCT INR: INR: 2.6 (ref 2.0–3.0)

## 2019-07-13 NOTE — Patient Instructions (Signed)
Continue warfarin 4mg  daily. Moved to [facility] 7/30. Started Harvoni 11/19/18 (can decrease INR).  Recheck INR in 2 weeks Order given to [employer] and faxed to [place] [phone]

## 2019-07-27 ENCOUNTER — Ambulatory Visit (INDEPENDENT_AMBULATORY_CARE_PROVIDER_SITE_OTHER): Payer: Medicare Other | Admitting: *Deleted

## 2019-07-27 DIAGNOSIS — Z952 Presence of prosthetic heart valve: Secondary | ICD-10-CM | POA: Diagnosis not present

## 2019-07-27 DIAGNOSIS — I69398 Other sequelae of cerebral infarction: Secondary | ICD-10-CM | POA: Diagnosis not present

## 2019-07-27 DIAGNOSIS — M6281 Muscle weakness (generalized): Secondary | ICD-10-CM | POA: Diagnosis not present

## 2019-07-27 DIAGNOSIS — I110 Hypertensive heart disease with heart failure: Secondary | ICD-10-CM | POA: Diagnosis not present

## 2019-07-27 DIAGNOSIS — I5032 Chronic diastolic (congestive) heart failure: Secondary | ICD-10-CM | POA: Diagnosis not present

## 2019-07-27 DIAGNOSIS — Z5181 Encounter for therapeutic drug level monitoring: Secondary | ICD-10-CM | POA: Diagnosis not present

## 2019-07-27 DIAGNOSIS — I69311 Memory deficit following cerebral infarction: Secondary | ICD-10-CM | POA: Diagnosis not present

## 2019-07-27 DIAGNOSIS — R2681 Unsteadiness on feet: Secondary | ICD-10-CM | POA: Diagnosis not present

## 2019-07-27 LAB — POCT INR: INR: 3.1 — AB (ref 2.0–3.0)

## 2019-07-27 NOTE — Patient Instructions (Signed)
Take warfarin 2mg  tonight then resume 4mg  daily. Moved to [facility] 7/30. Started Harvoni 11/19/18 (can decrease INR).  Recheck INR in 2 weeks Order given to [employer] and faxed to [place] [phone]

## 2019-08-03 ENCOUNTER — Other Ambulatory Visit: Payer: Self-pay | Admitting: *Deleted

## 2019-08-03 MED ORDER — ATORVASTATIN CALCIUM 10 MG PO TABS: 10.0000 mg | ORAL_TABLET | Freq: Every day | ORAL | 3 refills | Status: DC

## 2019-08-06 DIAGNOSIS — I69398 Other sequelae of cerebral infarction: Secondary | ICD-10-CM | POA: Diagnosis not present

## 2019-08-06 DIAGNOSIS — I69311 Memory deficit following cerebral infarction: Secondary | ICD-10-CM | POA: Diagnosis not present

## 2019-08-06 DIAGNOSIS — I110 Hypertensive heart disease with heart failure: Secondary | ICD-10-CM | POA: Diagnosis not present

## 2019-08-06 DIAGNOSIS — M6281 Muscle weakness (generalized): Secondary | ICD-10-CM | POA: Diagnosis not present

## 2019-08-06 DIAGNOSIS — I5032 Chronic diastolic (congestive) heart failure: Secondary | ICD-10-CM | POA: Diagnosis not present

## 2019-08-06 DIAGNOSIS — R2681 Unsteadiness on feet: Secondary | ICD-10-CM | POA: Diagnosis not present

## 2019-08-09 ENCOUNTER — Ambulatory Visit (INDEPENDENT_AMBULATORY_CARE_PROVIDER_SITE_OTHER): Payer: Medicare Other | Admitting: *Deleted

## 2019-08-09 DIAGNOSIS — R2681 Unsteadiness on feet: Secondary | ICD-10-CM | POA: Diagnosis not present

## 2019-08-09 DIAGNOSIS — I69311 Memory deficit following cerebral infarction: Secondary | ICD-10-CM | POA: Diagnosis not present

## 2019-08-09 DIAGNOSIS — Z5181 Encounter for therapeutic drug level monitoring: Secondary | ICD-10-CM

## 2019-08-09 DIAGNOSIS — M6281 Muscle weakness (generalized): Secondary | ICD-10-CM | POA: Diagnosis not present

## 2019-08-09 DIAGNOSIS — I110 Hypertensive heart disease with heart failure: Secondary | ICD-10-CM | POA: Diagnosis not present

## 2019-08-09 DIAGNOSIS — Z952 Presence of prosthetic heart valve: Secondary | ICD-10-CM

## 2019-08-09 DIAGNOSIS — I69398 Other sequelae of cerebral infarction: Secondary | ICD-10-CM | POA: Diagnosis not present

## 2019-08-09 DIAGNOSIS — I5032 Chronic diastolic (congestive) heart failure: Secondary | ICD-10-CM | POA: Diagnosis not present

## 2019-08-09 LAB — POCT INR: INR: 2.8 (ref 2.0–3.0)

## 2019-08-09 NOTE — Patient Instructions (Signed)
Continue warfarin 4mg  daily. Moved to [facility] 7/30. Started [place] 11/19/18 (can decrease INR).  Recheck INR in 2 weeks Order given to [employer] and faxed to [place] [phone]

## 2019-08-11 DIAGNOSIS — E119 Type 2 diabetes mellitus without complications: Secondary | ICD-10-CM | POA: Diagnosis not present

## 2019-08-11 DIAGNOSIS — R2681 Unsteadiness on feet: Secondary | ICD-10-CM | POA: Diagnosis not present

## 2019-08-11 DIAGNOSIS — Z794 Long term (current) use of insulin: Secondary | ICD-10-CM | POA: Diagnosis not present

## 2019-08-11 DIAGNOSIS — I110 Hypertensive heart disease with heart failure: Secondary | ICD-10-CM | POA: Diagnosis not present

## 2019-08-11 DIAGNOSIS — I69311 Memory deficit following cerebral infarction: Secondary | ICD-10-CM | POA: Diagnosis not present

## 2019-08-11 DIAGNOSIS — I69398 Other sequelae of cerebral infarction: Secondary | ICD-10-CM | POA: Diagnosis not present

## 2019-08-11 DIAGNOSIS — Z5181 Encounter for therapeutic drug level monitoring: Secondary | ICD-10-CM | POA: Diagnosis not present

## 2019-08-11 DIAGNOSIS — J449 Chronic obstructive pulmonary disease, unspecified: Secondary | ICD-10-CM | POA: Diagnosis not present

## 2019-08-11 DIAGNOSIS — I5032 Chronic diastolic (congestive) heart failure: Secondary | ICD-10-CM | POA: Diagnosis not present

## 2019-08-11 DIAGNOSIS — M6281 Muscle weakness (generalized): Secondary | ICD-10-CM | POA: Diagnosis not present

## 2019-08-11 DIAGNOSIS — Z7901 Long term (current) use of anticoagulants: Secondary | ICD-10-CM | POA: Diagnosis not present

## 2019-08-11 DIAGNOSIS — Z87891 Personal history of nicotine dependence: Secondary | ICD-10-CM | POA: Diagnosis not present

## 2019-08-11 DIAGNOSIS — F0391 Unspecified dementia with behavioral disturbance: Secondary | ICD-10-CM | POA: Diagnosis not present

## 2019-08-11 NOTE — Telephone Encounter (Signed)
Form for hospital bed received from Marilynn Rail at [facility] in PCP's box for signature. Once signed, will need to be faxed along with most recent OV notes to [phone]. Hubbard Hartshorn, BSN, RN-BC

## 2019-08-15 ENCOUNTER — Other Ambulatory Visit: Payer: Self-pay

## 2019-08-15 ENCOUNTER — Emergency Department (HOSPITAL_COMMUNITY)
Admission: EM | Admit: 2019-08-15 | Discharge: 2019-08-16 | Disposition: A | Discharge status: Home / Self Care | Payer: Medicare Other | Attending: Emergency Medicine | Admitting: Emergency Medicine

## 2019-08-15 ENCOUNTER — Encounter (HOSPITAL_COMMUNITY): Payer: Self-pay | Admitting: *Deleted

## 2019-08-15 DIAGNOSIS — Z7901 Long term (current) use of anticoagulants: Secondary | ICD-10-CM | POA: Insufficient documentation

## 2019-08-15 DIAGNOSIS — Z9049 Acquired absence of other specified parts of digestive tract: Secondary | ICD-10-CM | POA: Diagnosis not present

## 2019-08-15 DIAGNOSIS — Z952 Presence of prosthetic heart valve: Secondary | ICD-10-CM | POA: Insufficient documentation

## 2019-08-15 DIAGNOSIS — Z87891 Personal history of nicotine dependence: Secondary | ICD-10-CM | POA: Diagnosis not present

## 2019-08-15 DIAGNOSIS — Z794 Long term (current) use of insulin: Secondary | ICD-10-CM | POA: Insufficient documentation

## 2019-08-15 DIAGNOSIS — K068 Other specified disorders of gingiva and edentulous alveolar ridge: Secondary | ICD-10-CM | POA: Diagnosis not present

## 2019-08-15 DIAGNOSIS — I10 Essential (primary) hypertension: Secondary | ICD-10-CM | POA: Diagnosis not present

## 2019-08-15 DIAGNOSIS — F0390 Unspecified dementia without behavioral disturbance: Secondary | ICD-10-CM | POA: Insufficient documentation

## 2019-08-15 DIAGNOSIS — R531 Weakness: Secondary | ICD-10-CM | POA: Diagnosis not present

## 2019-08-15 DIAGNOSIS — R58 Hemorrhage, not elsewhere classified: Secondary | ICD-10-CM | POA: Diagnosis not present

## 2019-08-15 DIAGNOSIS — R0902 Hypoxemia: Secondary | ICD-10-CM | POA: Diagnosis not present

## 2019-08-15 DIAGNOSIS — Z98818 Other dental procedure status: Secondary | ICD-10-CM | POA: Insufficient documentation

## 2019-08-15 DIAGNOSIS — J449 Chronic obstructive pulmonary disease, unspecified: Secondary | ICD-10-CM | POA: Insufficient documentation

## 2019-08-15 DIAGNOSIS — I5030 Unspecified diastolic (congestive) heart failure: Secondary | ICD-10-CM | POA: Insufficient documentation

## 2019-08-15 DIAGNOSIS — Z79899 Other long term (current) drug therapy: Secondary | ICD-10-CM | POA: Diagnosis not present

## 2019-08-15 DIAGNOSIS — E119 Type 2 diabetes mellitus without complications: Secondary | ICD-10-CM | POA: Diagnosis not present

## 2019-08-15 DIAGNOSIS — R0689 Other abnormalities of breathing: Secondary | ICD-10-CM | POA: Diagnosis not present

## 2019-08-15 DIAGNOSIS — K08409 Partial loss of teeth, unspecified cause, unspecified class: Secondary | ICD-10-CM

## 2019-08-15 LAB — BASIC METABOLIC PANEL
Anion gap: 9 (ref 5–15)
BUN: 31 mg/dL — ABNORMAL HIGH (ref 8–23)
CO2: 25 mmol/L (ref 22–32)
Calcium: 8.5 mg/dL — ABNORMAL LOW (ref 8.9–10.3)
Chloride: 98 mmol/L (ref 98–111)
Creatinine, Ser: 1.75 mg/dL — ABNORMAL HIGH (ref 0.61–1.24)
GFR calc Af Amer: 45 mL/min — ABNORMAL LOW (ref >60)
GFR calc non Af Amer: 39 mL/min — ABNORMAL LOW (ref >60)
Glucose, Bld: 201 mg/dL — ABNORMAL HIGH (ref 70–99)
Potassium: 3.7 mmol/L (ref 3.5–5.1)
Sodium: 132 mmol/L — ABNORMAL LOW (ref 135–145)

## 2019-08-15 LAB — CBC WITH DIFFERENTIAL/PLATELET
Abs Immature Granulocytes: 0.04 10*3/uL (ref 0.00–0.07)
Basophils Absolute: 0.1 10*3/uL (ref 0.0–0.1)
Basophils Relative: 1 %
Eosinophils Absolute: 0.1 10*3/uL (ref 0.0–0.5)
Eosinophils Relative: 1 %
HCT: 33 % — ABNORMAL LOW (ref 39.0–52.0)
Hemoglobin: 10.6 g/dL — ABNORMAL LOW (ref 13.0–17.0)
Immature Granulocytes: 0 %
Lymphocytes Relative: 14 %
Lymphs Abs: 1.6 10*3/uL (ref 0.7–4.0)
MCH: 26.8 pg (ref 26.0–34.0)
MCHC: 32.1 g/dL (ref 30.0–36.0)
MCV: 83.3 fL (ref 80.0–100.0)
Monocytes Absolute: 0.9 10*3/uL (ref 0.1–1.0)
Monocytes Relative: 8 %
Neutro Abs: 8.4 10*3/uL — ABNORMAL HIGH (ref 1.7–7.7)
Neutrophils Relative %: 76 %
Platelets: 147 10*3/uL — ABNORMAL LOW (ref 150–400)
RBC: 3.96 MIL/uL — ABNORMAL LOW (ref 4.22–5.81)
RDW: 24.9 % — ABNORMAL HIGH (ref 11.5–15.5)
WBC: 11 10*3/uL — ABNORMAL HIGH (ref 4.0–10.5)
nRBC: 0 % (ref 0.0–0.2)

## 2019-08-15 LAB — PROTIME-INR
INR: 2.8 — ABNORMAL HIGH (ref 0.8–1.2)
Prothrombin Time: 28.8 seconds — ABNORMAL HIGH (ref 11.4–15.2)

## 2019-08-15 MED ORDER — HYDROCODONE-ACETAMINOPHEN 5-325 MG PO TABS
2.0000 | ORAL_TABLET | Freq: Once | ORAL | Status: AC
  Administered 2019-08-15: 2 via ORAL
  Filled 2019-08-15: qty 2

## 2019-08-15 NOTE — ED Provider Notes (Signed)
[facility] EMERGENCY DEPARTMENT Provider Note   CSN: [id number] Arrival date & time: 08/15/19  2150     History Chief Complaint  Patient presents with  . Coagulation Disorder    Justo Mowbray is a 69 y.o. male who presents with a bleeding problem s/p tooth extraction. He states he had the tooth extracted 3 days ago. He is on Coumadin for an artificial heart valve and this was not stopped before the extraction. Since the extraction he has had constant oozing from the gums. He was told to place pressure and gauze in the area. He had a stitch that was in the gums that came out. He called the dentist on call and they told him to keep doing gauze and pressure and they wouldn't put a stitch back in. After three days of bleeding he decided he needed to come to the ED. He also states his back is hurting from a prior fracture and he's requesting pain medicine.  HPI     Past Medical History:  Diagnosis Date  . COPD (chronic obstructive pulmonary disease) ([place])   . Dementia ([place])   . Depression   . Diabetes mellitus without complication ([place])   . GERD (gastroesophageal reflux disease)   . H/O diverticulitis of colon 11/03/2012   History of partial colectomy in 2011.  Marland Kitchen Hepatitis C   . High cholesterol   . Lower extremity edema   . Mechanical heart valve present   . Overdose of muscle relaxant 08/11/2017  . Pre-diabetes   . Retroperitoneal hematoma 01/29/2018  . S/P AVR (aortic valve replacement)   . Seizures ([place])   . 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 Microcytic anemia 06/17/2019  . Chronic, continuous use of opioids 06/17/2019  . Hepatic cirrhosis due to chronic hepatitis C infection ([place]) 09/16/2018  . Compression fracture of L1 lumbar vertebra ([place]) 06/18/2018  . H/O: GI bleed 06/18/2018  . Dementia associated with other underlying disease with behavioral disturbance ([place])   . S/P AVR (aortic valve replacement)   .  Chronic hepatitis C without hepatic coma ([place]) 01/12/2018  . 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 Chronic anticoagulation   . Seizure disorder ([place])   . COPD (chronic obstructive pulmonary disease) ([place])   . Type II diabetes mellitus ([place])   . Obesity 09/21/2015  . OSA (obstructive sleep apnea) 05/05/2015  . Tension headache, chronic 08/25/2013    Past Surgical History:  Procedure Laterality Date  . CARDIAC SURGERY    . CHOLECYSTECTOMY    . COLON SURGERY    . CORONARY/GRAFT ANGIOGRAPHY N/A 10/31/2017   Procedure: CORONARY/GRAFT ANGIOGRAPHY;  Surgeon: Nelva Bush, MD;  Location: [place] [facility];  Service: Cardiovascular;  Laterality: N/A;  . ESOPHAGOGASTRODUODENOSCOPY (EGD) WITH PROPOFOL N/A 01/18/2018   Procedure: ESOPHAGOGASTRODUODENOSCOPY (EGD) WITH PROPOFOL;  Surgeon: Mauri Pole, MD;  Location: [facility]COPY;  Service: Endoscopy;  Laterality: N/A;  . JOINT REPLACEMENT    . KNEE SURGERY Left        Family History  Problem Relation Age of Onset  . Hypertension Father     Social History   Tobacco Use  . Smoking status: Former Smoker    Packs/day: 1.00    Types: Cigarettes  . Smokeless tobacco: Never Used  Substance Use Topics  . Alcohol use: Not Currently  . Drug use: Never    Home Medications Prior to Admission medications  Medication Sig Start Date End Date Taking? Authorizing Provider  albuterol (VENTOLIN HFA) 108 (90 Base) MCG/ACT inhaler Inhale 2 puffs into the lungs every 4 (four) hours as needed for wheezing or shortness of breath. 11/06/18   [name], MD  atorvastatin (LIPITOR) 10 MG tablet Take 1 tablet (10 mg total) by mouth daily. 08/03/19   [name], MD  carvedilol (COREG) 6.25 MG tablet TAKE 1 TABLET (6.25 MG TOTAL) BY MOUTH 2 (TWO) TIMES DAILY WITH A MEAL. 02/08/19   [name], MD  escitalopram (LEXAPRO) 10 MG tablet Take 1 tablet (10 mg total) by mouth at bedtime. Patient taking differently: Take 10 mg by  mouth daily.  06/23/18   [name], MD  furosemide (LASIX) 40 MG tablet Take 1 tablet (40 mg total) by mouth 2 (two) times daily. Please take 80mg  am, 40mg  in pm for 5 days and then resume 40mg  BID 02/03/19   [name], MD  glucose blood test strip Test blood sugar 3 times daily before meals 06/22/19   [name], MD  HYDROcodone-acetaminophen (NORCO/VICODIN) 5-325 MG tablet Take 2 tablets by mouth every 6 (six) hours as needed for moderate pain. 06/15/19   [name], MD  insulin aspart (NOVOLOG FLEXPEN) 100 UNIT/ML FlexPen INJECT 10 UNITS INTO THE SKIN 3 (THREE) TIMES DAILY WITH MEALS. ADJUST DOSE AS NEEDED 03/19/19   [name], MD  insulin detemir (LEVEMIR) 100 UNIT/ML injection Inject 0.28 mLs (28 Units total) into the skin at bedtime. Increase Levemir by 2 units when the week average is >200. 11/06/18   [name], MD  Insulin Pen Needle (ADVOCATE INSULIN PEN NEEDLES) 31G X 5 MM MISC 1 Package by Does not apply route 4 (four) times daily. 02/03/19   [name], MD  lactulose (CHRONULAC) 10 GM/15ML solution Take 15 mLs (10 g total) by mouth 2 (two) times daily. 06/15/19   [name], MD  Lancets MISC 1 Stick by Does not apply route 3 (three) times daily with meals. 06/22/19   [name], MD  levETIRAcetam (KEPPRA) 1000 MG tablet Take 1 tablet (1,000 mg total) by mouth 2 (two) times daily. 09/17/18   Cameron Sprang, MD  memantine (NAMENDA) 5 MG tablet Take 1 tablet (5 mg total) by mouth 2 (two) times daily. 09/17/18   Cameron Sprang, MD  pantoprazole (PROTONIX) 40 MG tablet Take 1 tablet (40 mg total) by mouth daily. 07/14/18   [name], MD  QUEtiapine (SEROQUEL) 100 MG tablet TAKE HALF A TABLET IN THE MORNING AND 1.5 TABLETS IN THE EVENING 11/06/18   [name], MD  spironolactone (ALDACTONE) 50 MG tablet Take 50 mg by mouth daily.    [provider]  warfarin (COUMADIN) 4 MG tablet Take 1 tablet (4 mg total) by mouth daily. Patient taking differently: Take 4 mg by  mouth daily. 2mg  on Fridays 07/14/18   [name], MD    Allergies    Oxycodone, Ativan [lorazepam], and Morphine and related  Review of Systems   Review of Systems  HENT: Positive for dental problem.   Neurological: Negative for light-headedness.  Hematological: Bruises/bleeds easily.  All other systems reviewed and are negative.   Physical Exam Updated Vital Signs BP 127/66   Pulse 89   Temp 98.4 F (36.9 C)   Resp 18   Ht 6' (1.829 m)   Wt 136.1 kg   SpO2 99%   BMI  40.69 kg/m   Physical Exam Vitals and nursing note reviewed.  Constitutional:      General: He is not in acute distress.    Appearance: Normal appearance. He is well-developed. He is not ill-appearing.  HENT:     Head: Normocephalic and atraumatic.     Mouth/Throat:     Comments: Oozing of blood from the top left posterior gumline where teeth were extracted from Eyes:     General: No scleral icterus.       Right eye: No discharge.        Left eye: No discharge.     Conjunctiva/sclera: Conjunctivae normal.     Pupils: Pupils are equal, round, and reactive to light.  Cardiovascular:     Rate and Rhythm: Normal rate.  Pulmonary:     Effort: Pulmonary effort is normal. No respiratory distress.  Abdominal:     General: There is no distension.  Musculoskeletal:     Cervical back: Normal range of motion.  Skin:    General: Skin is warm and dry.  Neurological:     Mental Status: He is alert and oriented to person, place, and time.  Psychiatric:        Behavior: Behavior normal.     ED Results / Procedures / Treatments   Labs (all labs ordered are listed, but only abnormal results are displayed) Labs Reviewed  BASIC METABOLIC PANEL - Abnormal; Notable for the following components:      Result Value   Sodium 132 (*)    Glucose, Bld 201 (*)    BUN 31 (*)    Creatinine, Ser 1.75 (*)    Calcium 8.5 (*)    GFR calc non Af Amer 39 (*)    GFR calc Af Amer 45 (*)    All other components within  normal limits  CBC WITH DIFFERENTIAL/PLATELET - Abnormal; Notable for the following components:   WBC 11.0 (*)    RBC 3.96 (*)    Hemoglobin 10.6 (*)    HCT 33.0 (*)    RDW 24.9 (*)    Platelets 147 (*)    Neutro Abs 8.4 (*)    All other components within normal limits  PROTIME-INR - Abnormal; Notable for the following components:   Prothrombin Time 28.8 (*)    INR 2.8 (*)    All other components within normal limits    EKG None  Radiology No results found.  Procedures Procedures (including critical care time)  Medications Ordered in ED Medications  HYDROcodone-acetaminophen (NORCO/VICODIN) 5-325 MG per tablet 2 tablet (2 tablets Oral Given 08/15/19 2320)  tranexamic acid (CYKLOKAPRON) 1000 MG/10ML topical solution 500 mg (500 mg Topical Given by Other 08/16/19 0057)  tranexamic acid (CYKLOKAPRON) injection 500 mg (500 mg Topical Given by Other 08/16/19 0120)    ED Course  I have reviewed the triage vital signs and the nursing notes.  Pertinent labs & imaging results that were available during my care of the patient were reviewed by me and considered in my medical decision making (see chart for details).  69 year old male presents with constant oozing from the left upper gumline from a tooth extraction 3 days ago. His vitals are normal. Labs were checked and show stable anemia (hgb 10) and therapeutic INR (2.8). Initially a tea bag was used however this did not provide hemostasis. Thrombi-pad was used which seemed to slow the oozing down but wound was not hemostatic. Finally TXA soaked gauze and pressure were used and this  provided hemostasis. Pt was instructed to keep this in until the morning. He was instructed to f/u with his dentist as well.  MDM Rules/Calculators/A&P                       Final Clinical Impression(s) / ED Diagnoses Final diagnoses:  History of tooth extraction, unspecified edentulism class  Bleeding gums    Rx / DC Orders ED Discharge Orders    None         Recardo Evangelist, PA-C 08/16/19 0141    Merrily Pew, MD 08/16/19 [phone]

## 2019-08-15 NOTE — ED Notes (Signed)
Tea bags placed in patient's mouth for bleeding control.

## 2019-08-15 NOTE — ED Triage Notes (Addendum)
Pt had two teeth removed this past Tuesday, reports that he has been bleeding from his gums since then, worse tonight, pt is a resident of [facility] pt reports that his is taking coumadin

## 2019-08-16 ENCOUNTER — Encounter: Payer: Self-pay | Admitting: Neurology

## 2019-08-16 ENCOUNTER — Telehealth (INDEPENDENT_AMBULATORY_CARE_PROVIDER_SITE_OTHER): Payer: Medicare Other | Admitting: Neurology

## 2019-08-16 DIAGNOSIS — F0281 Dementia in other diseases classified elsewhere with behavioral disturbance: Secondary | ICD-10-CM

## 2019-08-16 DIAGNOSIS — G4700 Insomnia, unspecified: Secondary | ICD-10-CM | POA: Diagnosis not present

## 2019-08-16 DIAGNOSIS — G40009 Localization-related (focal) (partial) idiopathic epilepsy and epileptic syndromes with seizures of localized onset, not intractable, without status epilepticus: Secondary | ICD-10-CM

## 2019-08-16 DIAGNOSIS — F02818 Dementia in other diseases classified elsewhere, unspecified severity, with other behavioral disturbance: Secondary | ICD-10-CM

## 2019-08-16 MED ORDER — TRANEXAMIC ACID 1000 MG/10ML IV SOLN
500.0000 mg | Freq: Once | INTRAVENOUS | Status: AC
  Administered 2019-08-16: 500 mg via TOPICAL
  Filled 2019-08-16: qty 10

## 2019-08-16 MED ORDER — QUETIAPINE FUMARATE 25 MG PO TABS: ORAL_TABLET | ORAL | 3 refills | Status: DC

## 2019-08-16 MED ORDER — QUETIAPINE FUMARATE 100 MG PO TABS: ORAL_TABLET | ORAL | 3 refills | Status: DC

## 2019-08-16 MED ORDER — TRANEXAMIC ACID FOR EPISTAXIS
500.0000 mg | Freq: Once | TOPICAL | Status: DC
  Filled 2019-08-16: qty 10

## 2019-08-16 NOTE — Telephone Encounter (Signed)
A user error has taken place: encounter opened in error, closed for administrative reasons.

## 2019-08-16 NOTE — Discharge Instructions (Signed)
Continue to use gauze tonight - you can remove in the morning Please only eat soft foods until the area heals Make a follow up appointment with your dentist

## 2019-08-16 NOTE — ED Notes (Signed)
[facility] contacted and advised that they did not have means of transportation. EMS contacted for patient transport.
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
## 2019-08-16 NOTE — ED Notes (Signed)
Thrombi gauzes placed in patient's mouth.

## 2019-08-17 DIAGNOSIS — L84 Corns and callosities: Secondary | ICD-10-CM | POA: Diagnosis not present

## 2019-08-17 DIAGNOSIS — E1051 Type 1 diabetes mellitus with diabetic peripheral angiopathy without gangrene: Secondary | ICD-10-CM | POA: Diagnosis not present

## 2019-08-23 ENCOUNTER — Ambulatory Visit (INDEPENDENT_AMBULATORY_CARE_PROVIDER_SITE_OTHER): Payer: Medicare Other | Admitting: *Deleted

## 2019-08-23 DIAGNOSIS — I69311 Memory deficit following cerebral infarction: Secondary | ICD-10-CM | POA: Diagnosis not present

## 2019-08-23 DIAGNOSIS — Z952 Presence of prosthetic heart valve: Secondary | ICD-10-CM

## 2019-08-23 DIAGNOSIS — M6281 Muscle weakness (generalized): Secondary | ICD-10-CM | POA: Diagnosis not present

## 2019-08-23 DIAGNOSIS — I69398 Other sequelae of cerebral infarction: Secondary | ICD-10-CM | POA: Diagnosis not present

## 2019-08-23 DIAGNOSIS — I5032 Chronic diastolic (congestive) heart failure: Secondary | ICD-10-CM | POA: Diagnosis not present

## 2019-08-23 DIAGNOSIS — Z5181 Encounter for therapeutic drug level monitoring: Secondary | ICD-10-CM

## 2019-08-23 DIAGNOSIS — I110 Hypertensive heart disease with heart failure: Secondary | ICD-10-CM | POA: Diagnosis not present

## 2019-08-23 DIAGNOSIS — R2681 Unsteadiness on feet: Secondary | ICD-10-CM | POA: Diagnosis not present

## 2019-08-23 LAB — POCT INR: INR: 3.1 — AB (ref 2.0–3.0)

## 2019-08-23 NOTE — Patient Instructions (Signed)
Take warfarin 1/2 tablet tonight then resume 1 tablet daily.   Recheck INR in 2 weeks Order given to [employer] and faxed to [place] [phone]

## 2019-08-30 ENCOUNTER — Telehealth: Payer: Self-pay | Admitting: Internal Medicine

## 2019-08-30 DIAGNOSIS — S32010S Wedge compression fracture of first lumbar vertebra, sequela: Secondary | ICD-10-CM

## 2019-08-30 MED ORDER — HYDROCODONE-ACETAMINOPHEN 5-325 MG PO TABS: 1.0000 | ORAL_TABLET | Freq: Four times a day (QID) | ORAL | 0 refills | Status: DC | PRN

## 2019-08-30 NOTE — Addendum Note (Signed)
Addended by: [name] on: 08/30/2019 12:28 PM   Modules accepted: Orders

## 2019-08-30 NOTE — Telephone Encounter (Signed)
Yes. Glucose Testing is TID. I will send pain med as 1 tablet q6hr PRN. But if he develops worsening symptoms he may need to return to clinic for re-evaluation.

## 2019-08-30 NOTE — Telephone Encounter (Signed)
Nurse Sharyn Lull needs order for glucose testing faxed to her, she questions if it is TID or QID testing.  She states patient's family insisting it is TID testing, RN informs Sharyn Lull per test strip RX, order is for TID as well.  Sharyn Lull asked if [facility] can fax this to her at [phone],  Faxed from med list.  Also, she is requesting a new RX for hydrocodone 5/325 be sent to [facility].  Per med list, pt is to take 2 tablets Q 6hours PRN.  Sharyn Lull states "this is too much and the order they have states take 1 tablet q hours PRN".  She is requesting and order for 1 tablet Q 6 hours PRN.  She states they can't accept an order for 1-2 tablets, must state 1 tablet.  Will forward to PCP SChaplin, RN,BSN

## 2019-08-30 NOTE — Telephone Encounter (Signed)
Pls contact [facility] [phone] ext [phone]

## 2019-09-04 ENCOUNTER — Other Ambulatory Visit: Payer: Self-pay | Admitting: Internal Medicine

## 2019-09-06 ENCOUNTER — Ambulatory Visit (INDEPENDENT_AMBULATORY_CARE_PROVIDER_SITE_OTHER): Payer: Medicare Other | Admitting: *Deleted

## 2019-09-06 DIAGNOSIS — M6281 Muscle weakness (generalized): Secondary | ICD-10-CM | POA: Diagnosis not present

## 2019-09-06 DIAGNOSIS — Z952 Presence of prosthetic heart valve: Secondary | ICD-10-CM

## 2019-09-06 DIAGNOSIS — I110 Hypertensive heart disease with heart failure: Secondary | ICD-10-CM | POA: Diagnosis not present

## 2019-09-06 DIAGNOSIS — I69311 Memory deficit following cerebral infarction: Secondary | ICD-10-CM | POA: Diagnosis not present

## 2019-09-06 DIAGNOSIS — R2681 Unsteadiness on feet: Secondary | ICD-10-CM | POA: Diagnosis not present

## 2019-09-06 DIAGNOSIS — I69398 Other sequelae of cerebral infarction: Secondary | ICD-10-CM | POA: Diagnosis not present

## 2019-09-06 DIAGNOSIS — Z5181 Encounter for therapeutic drug level monitoring: Secondary | ICD-10-CM | POA: Diagnosis not present

## 2019-09-06 DIAGNOSIS — I5032 Chronic diastolic (congestive) heart failure: Secondary | ICD-10-CM | POA: Diagnosis not present

## 2019-09-06 LAB — POCT INR: INR: 2.3 (ref 2.0–3.0)

## 2019-09-06 NOTE — Patient Instructions (Signed)
Continue warfarin 1 tablet daily.   Recheck INR in 2 weeks Order given to Kim Green RN [facility] and faxed to [facility] [phone] 

## 2019-09-10 DIAGNOSIS — I69398 Other sequelae of cerebral infarction: Secondary | ICD-10-CM | POA: Diagnosis not present

## 2019-09-20 ENCOUNTER — Other Ambulatory Visit: Payer: Self-pay | Admitting: *Deleted

## 2019-09-20 DIAGNOSIS — S32010S Wedge compression fracture of first lumbar vertebra, sequela: Secondary | ICD-10-CM

## 2019-09-20 MED ORDER — HYDROCODONE-ACETAMINOPHEN 5-325 MG PO TABS: 1.0000 | ORAL_TABLET | Freq: Four times a day (QID) | ORAL | 0 refills | Status: DC | PRN

## 2019-09-20 NOTE — Telephone Encounter (Signed)
It looks like he's due for his 3 month f/u visit. Could we schedule a telehealth visit during my clinic next week to address the sleep issue?

## 2019-09-20 NOTE — Telephone Encounter (Signed)
Received call from [facility], Resident Care Coordinator for [facility], requesting refill on hydrocodone.  Also states patient has been more active in the community but is not sleeping at night. Requesting PCP review med list. Hubbard Hartshorn, BSN, RN-BC

## 2019-09-21 ENCOUNTER — Telehealth: Payer: Self-pay | Admitting: *Deleted

## 2019-09-21 ENCOUNTER — Ambulatory Visit (INDEPENDENT_AMBULATORY_CARE_PROVIDER_SITE_OTHER): Payer: Medicare Other | Admitting: *Deleted

## 2019-09-21 DIAGNOSIS — Z5181 Encounter for therapeutic drug level monitoring: Secondary | ICD-10-CM

## 2019-09-21 DIAGNOSIS — Z952 Presence of prosthetic heart valve: Secondary | ICD-10-CM

## 2019-09-21 LAB — POCT INR: INR: 2.3 (ref 2.0–3.0)

## 2019-09-21 NOTE — Telephone Encounter (Signed)
Kathlee Nations, RN with [facility] called to report patient has had 11 lb weight gain since her last visit. Usually weighs 299-300 lbs and today he is 311. Facility reported 5 lb weight gain overnight from 6/4-09/18/2019. Systolic BP usually runs 314 and today BP 156/82. Kathlee Nations states abd feels firm and patient feels "full." ACC appt given for tomorrow at 1015.

## 2019-09-21 NOTE — Patient Instructions (Signed)
Continue warfarin 1 tablet daily.   Recheck INR in 2 weeks Order given to [employer] and faxed to [place] [phone]

## 2019-09-21 NOTE — Telephone Encounter (Signed)
Pt's daughter called also, she states that the facility had provided pt with a big bowl of sweet and salty snacks and told him not to eat them all at once. With his memory issues he consumed them in a short amount of time, she feels this is playing into the weight gain. She states she really wants to see dr Truman Hayward w/ pt and discuss the progression of his dementia, appt 6/15 at 1345 dr Truman Hayward. She will not be at the appt 6/9 due to work

## 2019-09-22 ENCOUNTER — Telehealth: Payer: Self-pay | Admitting: *Deleted

## 2019-09-22 ENCOUNTER — Other Ambulatory Visit: Payer: Self-pay

## 2019-09-22 ENCOUNTER — Ambulatory Visit (INDEPENDENT_AMBULATORY_CARE_PROVIDER_SITE_OTHER): Payer: Medicare Other | Admitting: Internal Medicine

## 2019-09-22 VITALS — BP 116/54 | HR 74 | Temp 98.0°F | Ht 71.0 in | Wt 314.9 lb

## 2019-09-22 DIAGNOSIS — S32010S Wedge compression fracture of first lumbar vertebra, sequela: Secondary | ICD-10-CM | POA: Diagnosis not present

## 2019-09-22 DIAGNOSIS — X58XXXS Exposure to other specified factors, sequela: Secondary | ICD-10-CM | POA: Diagnosis not present

## 2019-09-22 DIAGNOSIS — I38 Endocarditis, valve unspecified: Secondary | ICD-10-CM

## 2019-09-22 DIAGNOSIS — I5030 Unspecified diastolic (congestive) heart failure: Secondary | ICD-10-CM

## 2019-09-22 DIAGNOSIS — R600 Localized edema: Secondary | ICD-10-CM

## 2019-09-22 MED ORDER — FUROSEMIDE 40 MG PO TABS: 80.0000 mg | ORAL_TABLET | Freq: Two times a day (BID) | ORAL | 0 refills | Status: DC

## 2019-09-22 MED ORDER — FUROSEMIDE 40 MG PO TABS: 80.0000 mg | ORAL_TABLET | Freq: Two times a day (BID) | ORAL | 3 refills | Status: DC

## 2019-09-22 NOTE — Telephone Encounter (Signed)
This patient was seen this morning and is  already sch on 09/28/2019 to see Dr. Truman Hayward.  Can this be addressed at one of these visits?

## 2019-09-22 NOTE — Telephone Encounter (Addendum)
Yes, Dr. Truman Hayward can address this at his appt on 09/28/2019 if it wasn't addressed today. Thank you! L. Anayah Arvanitis, BSN, RN-BC

## 2019-09-22 NOTE — Assessment & Plan Note (Addendum)
[name] is a 69 year old gentleman with a history of COPD, heart failure, treated hepatitis C, and cirrhosis who presents today from [facility] with his daughter with increased lower extremity swelling.  The patient's daughter states that the one of the nurses who works at the facility noticed that [name] legs and abdomen appear to have become more swollen than usual.  In addition, the patient gets his weight taken daily at the facility and was found to have gained 11 pounds over the past week.  The patient states that he also notices the swelling in his legs and feels bloated in his abdomen.  He denies any shortness of breath or orthopnea.  On physical exam, the patient has clear lungs.  Unable to assess for JVD due to patient's immobility and need to sit up in wheelchair.  2+ pitting edema is present to the bilateral upper shins. Bedside abdominal ultrasound performed with Dr. Evette Savage did not show any free fluid to suggest ascites.  Plan: -Increased Lasix 40 mg twice daily to 80 mg twice daily -Daily weights at facility -Follow-up appointment with Dr. Truman Savage on 6/15 to reassess volume status and Lasix dose, -Consider BMP at next visit

## 2019-09-22 NOTE — Progress Notes (Signed)
   CC: Leg swelling  HPI: [name] is a 69 y.o. who presents today for weight gain and bilateral leg swelling. Please refer to the problem based charting for details  Past Medical History:  Diagnosis Date  . COPD (chronic obstructive pulmonary disease) ([place])   . Dementia ([place])   . Depression   . Diabetes mellitus without complication ([place])   . GERD (gastroesophageal reflux disease)   . H/O diverticulitis of colon 11/03/2012   History of partial colectomy in 2011.  Marland Kitchen Hepatitis C   . High cholesterol   . Lower extremity edema   . Mechanical heart valve present   . Overdose of muscle relaxant 08/11/2017  . Pre-diabetes   . Retroperitoneal hematoma 01/29/2018  . S/P AVR (aortic valve replacement)   . Seizures ([place])   . 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  Physical Exam:  Vitals:   09/22/19 1033  BP: (!) 116/54  Pulse: 74  Temp: 98 F (36.7 C)  TempSrc: Oral  SpO2: 100%  Weight: (!) 314 lb 14.4 oz (142.8 kg)  Height: 5\' 11"  (1.803 m)   Physical Exam Vitals reviewed.  Constitutional:      General: He is not in acute distress.    Appearance: Normal appearance. He is obese. He is ill-appearing (chronically). He is not toxic-appearing or diaphoretic.  HENT:     Head: Normocephalic and atraumatic.  Cardiovascular:     Rate and Rhythm: Normal rate and regular rhythm.     Heart sounds: Normal heart sounds. No murmur. No friction rub. No gallop.   Pulmonary:     Effort: Pulmonary effort is normal. No respiratory distress.     Breath sounds: Normal breath sounds. No wheezing or rales.  Abdominal:     General: Bowel sounds are normal. There is distension.     Palpations: Abdomen is soft.     Tenderness: There is no abdominal tenderness. There is no guarding.  Musculoskeletal:     Right lower leg: Edema (2+ pitting to the upper shin) present.     Left lower leg: Edema (2+ pitting  to the upper shin) present.  Neurological:     Mental Status: He is alert. Mental status is at baseline.  Psychiatric:        Mood and Affect: Mood normal.        Behavior: Behavior normal.    Assessment & Plan:   See Encounters Tab for problem based charting.  Patient seen with Dr. Evette Doffing

## 2019-09-22 NOTE — Patient Instructions (Addendum)
Thank you for visiting [place] in clinic today.  Below is a summary of what we discussed:  1.  Leg swelling -The leg swelling you are having is due to increased fluid in your body. -We increased her Lasix to 80 mg twice a day. -The nurses should continue to perform daily weights to make sure you do not continue to gain weight. -Continue taking Lasix 80 mg twice a day.  We can discuss when to to your normal dose at your next follow-up appointment with Dr. Truman Hayward on 6/15  If you have any questions or concerns, please feel free to reach out to [place].

## 2019-09-22 NOTE — Telephone Encounter (Signed)
Glendell Docker from [facility] stated he talked to Dr Sheppard Coil but could not find him in their system. NPI and DEA #'s given along with our address.

## 2019-09-22 NOTE — Assessment & Plan Note (Signed)
Patient has a history of chronic back pain and is currently on chronic opioid therapy.  Patient's daughter voiced concern that the patient does not have the cognitive faculties to ask for his opiates every 6 hours as needed, and was wondering if it may be beneficial to schedule his oxycodone 5 mg every 6 hours instead.  She is agreeable to discussing this with Dr. Truman Hayward at the next clinic visit.  Plan: -Continue oxycodone 5 mg every 6 hours as needed -Discussed scheduling oxycodone at next visit with Dr. Truman Hayward

## 2019-09-23 NOTE — Progress Notes (Signed)
Internal Medicine Clinic Attending ° °I saw and evaluated the patient.  I personally confirmed the key portions of the history and exam documented by Dr. Alexander and I reviewed pertinent patient test results.  The assessment, diagnosis, and plan were formulated together and I agree with the documentation in the resident’s note.  °

## 2019-09-27 ENCOUNTER — Ambulatory Visit (INDEPENDENT_AMBULATORY_CARE_PROVIDER_SITE_OTHER): Payer: Medicare Other | Admitting: *Deleted

## 2019-09-27 DIAGNOSIS — Z5181 Encounter for therapeutic drug level monitoring: Secondary | ICD-10-CM | POA: Diagnosis not present

## 2019-09-27 DIAGNOSIS — Z952 Presence of prosthetic heart valve: Secondary | ICD-10-CM

## 2019-09-27 LAB — POCT INR: INR: 2.6 (ref 2.0–3.0)

## 2019-09-27 NOTE — Patient Instructions (Signed)
Continue warfarin 1 tablet daily.   Recheck INR in 2 weeks Order given to Katherine Basset RN [facility] and faxed to [facility] [phone]

## 2019-09-28 ENCOUNTER — Encounter: Payer: Self-pay | Admitting: Internal Medicine

## 2019-09-28 ENCOUNTER — Ambulatory Visit (INDEPENDENT_AMBULATORY_CARE_PROVIDER_SITE_OTHER): Payer: Medicare Other | Admitting: Internal Medicine

## 2019-09-28 VITALS — BP 124/60 | HR 71 | Temp 98.8°F | Ht 71.0 in | Wt 305.1 lb

## 2019-09-28 DIAGNOSIS — S32010S Wedge compression fracture of first lumbar vertebra, sequela: Secondary | ICD-10-CM

## 2019-09-28 DIAGNOSIS — K7460 Unspecified cirrhosis of liver: Secondary | ICD-10-CM | POA: Diagnosis not present

## 2019-09-28 DIAGNOSIS — I5032 Chronic diastolic (congestive) heart failure: Secondary | ICD-10-CM | POA: Diagnosis not present

## 2019-09-28 DIAGNOSIS — F02818 Dementia in other diseases classified elsewhere, unspecified severity, with other behavioral disturbance: Secondary | ICD-10-CM

## 2019-09-28 DIAGNOSIS — I5030 Unspecified diastolic (congestive) heart failure: Secondary | ICD-10-CM | POA: Diagnosis not present

## 2019-09-28 DIAGNOSIS — F0281 Dementia in other diseases classified elsewhere with behavioral disturbance: Secondary | ICD-10-CM

## 2019-09-28 DIAGNOSIS — E119 Type 2 diabetes mellitus without complications: Secondary | ICD-10-CM

## 2019-09-28 DIAGNOSIS — Z794 Long term (current) use of insulin: Secondary | ICD-10-CM | POA: Diagnosis not present

## 2019-09-28 DIAGNOSIS — X58XXXS Exposure to other specified factors, sequela: Secondary | ICD-10-CM

## 2019-09-28 DIAGNOSIS — B182 Chronic viral hepatitis C: Secondary | ICD-10-CM

## 2019-09-28 DIAGNOSIS — R410 Disorientation, unspecified: Secondary | ICD-10-CM | POA: Diagnosis not present

## 2019-09-28 DIAGNOSIS — I38 Endocarditis, valve unspecified: Secondary | ICD-10-CM

## 2019-09-28 DIAGNOSIS — G40909 Epilepsy, unspecified, not intractable, without status epilepticus: Secondary | ICD-10-CM | POA: Diagnosis not present

## 2019-09-28 MED ORDER — ESCITALOPRAM OXALATE 10 MG PO TABS: 20.0000 mg | ORAL_TABLET | Freq: Every day | ORAL | 3 refills | Status: DC

## 2019-09-28 NOTE — Patient Instructions (Signed)
Thank you for allowing [place] to provide your care today. Today we discussed your leg swelling and memory difficulties    I have ordered TSH, Bmp labs for you. I will call if any are abnormal.    Today we made the following changes to your medications.    Please increase your Lexapro to 20mg  at bedtime  Please follow-up in 3 months.    Should you have any questions or concerns please call the internal medicine clinic at [phone].     Peripheral Edema  Peripheral edema is swelling that is caused by a buildup of fluid. Peripheral edema most often affects the lower legs, ankles, and feet. It can also develop in the arms, hands, and face. The area of the body that has peripheral edema will look swollen. It may also feel heavy or warm. Your clothes may start to feel tight. Pressing on the area may make a temporary dent in your skin. You may not be able to move your swollen arm or leg as much as usual. There are many causes of peripheral edema. It can happen because of a complication of other conditions such as congestive heart failure, kidney disease, or a problem with your blood circulation. It also can be a side effect of certain medicines or because of an infection. It often happens to women during pregnancy. Sometimes, the cause is not known. Follow these instructions at home: Managing pain, stiffness, and swelling   Raise (elevate) your legs while you are sitting or lying down.  Move around often to prevent stiffness and to lessen swelling.  Do not sit or stand for long periods of time.  Wear support stockings as told by your health care provider. Medicines  Take over-the-counter and prescription medicines only as told by your health care provider.  Your health care provider may prescribe medicine to help your body get rid of excess water (diuretic). General instructions  Pay attention to any changes in your symptoms.  Follow instructions from your health care provider about  limiting salt (sodium) in your diet. Sometimes, eating less salt may reduce swelling.  Moisturize skin daily to help prevent skin from cracking and draining.  Keep all follow-up visits as told by your health care provider. This is important. Contact a health care provider if you have:  A fever.  Edema that starts suddenly or is getting worse, especially if you are pregnant or have a medical condition.  Swelling in only one leg.  Increased swelling, redness, or pain in one or both of your legs.  Drainage or sores at the area where you have edema. Get help right away if you:  Develop shortness of breath, especially when you are lying down.  Have pain in your chest or abdomen.  Feel weak.  Feel faint. Summary  Peripheral edema is swelling that is caused by a buildup of fluid. Peripheral edema most often affects the lower legs, ankles, and feet.  Move around often to prevent stiffness and to lessen swelling. Do not sit or stand for long periods of time.  Pay attention to any changes in your symptoms.  Contact a health care provider if you have edema that starts suddenly or is getting worse, especially if you are pregnant or have a medical condition.  Get help right away if you develop shortness of breath, especially when lying down. This information is not intended to replace advice given to you by your health care provider. Make sure you discuss any questions you have with  your health care provider. Document Revised: 12/24/2017 Document Reviewed: 12/24/2017 Elsevier Patient Education  [place].

## 2019-09-29 LAB — BMP8+ANION GAP
Anion Gap: 14 mmol/L (ref 10.0–18.0)
BUN/Creatinine Ratio: 14 (ref 10–24)
BUN: 23 mg/dL (ref 8–27)
CO2: 25 mmol/L (ref 20–29)
Calcium: 8.4 mg/dL — ABNORMAL LOW (ref 8.6–10.2)
Chloride: 96 mmol/L (ref 96–106)
Creatinine, Ser: 1.6 mg/dL — ABNORMAL HIGH (ref 0.76–1.27)
GFR calc Af Amer: 50 mL/min/{1.73_m2} — ABNORMAL LOW (ref >59)
GFR calc non Af Amer: 43 mL/min/{1.73_m2} — ABNORMAL LOW (ref >59)
Glucose: 255 mg/dL — ABNORMAL HIGH (ref 65–99)
Potassium: 3.8 mmol/L (ref 3.5–5.2)
Sodium: 135 mmol/L (ref 134–144)

## 2019-09-29 LAB — TSH: TSH: 1.59 u[IU]/mL (ref 0.450–4.500)

## 2019-09-29 NOTE — Assessment & Plan Note (Signed)
Presents for f/u management for acute on chronic diastolic heart failure. Seen about a week ago by Dr.Alexander. Noted to be 15 pounds over dry weight with significant lower extremity edema. Per daughter, patient has been non-adherent to diet due to access to snacks at his facility. At last visit, increased to furosemide 80mg  BID.   A/P On exam, appear to have significant improvement in lower extremity edema although about 3-5 pounds still over dry weight. Will check bmp this visit. If renal fx, stable would continue at current dose for the short term as he continues to endorse some edema. - C/w furosemide 80mg  BID, sprinolactone 50mg  daily - BMP

## 2019-09-29 NOTE — Assessment & Plan Note (Addendum)
Mr.Luhn is a 69 yo M w/ PMH of HFpEF, mechanical AV on Coumadin, Cirrhosis, seizure disorder and Alzheimer's dementia presenting to [facility] for management of his chronic conditions. He was examined and evaluated with daughter present. Daughter mentions significant waxing and waning mentation with concerns regarding worsening dementia. She mentions that recently with his acute heart failure exacerbation, he had worsening difficulty with staying asleep and appeared much more confused during the day. Denies any falls, head trauma. Mentions at current visit his mentation appears much more improved than prior visit.  A/P Mini-Cog performed during visit with score of 3 which is significantly improved compared to prior visit. Waxing and waning mentation consistent w/ hx of dementia. Reassured family that this is expected. Also discussed likely nocturnal dyspnea contributing to increased confusion, with expected improvement after diuresis. Discussed increasing night time escitalopram to assist with insomnia as recommended by Dr.Aquino on recent visit. - Increase escitalopram to 20mg  qhs - Discuss sleep hygiene. - C/w memantine 5mg  BID, seroquel 25mg  in am, 150mg  nighttime - Check TSH to r/o hyperthyroidism

## 2019-09-29 NOTE — Assessment & Plan Note (Addendum)
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 As part of the treatment plan, the [place] controlled substance database is checked at least twice yearly and the database results was checked 09/28/19 and was found not to be updated. [place] was contacted and was able to verify proper dispense dates.   The last UDS was on 06/2018 and results were as expected. Patient needs a yearly UDS.  Pain contract was not signed due to dementia. Through his daughter, Adam Savage describes commitment to avoid excessive opioid use and only relying on pain medications to pursue goal of being able to interact with his grand kids.  The patient is on hydrocodone-acetaminophen, 5-325mg  60 per 30 days. Adjunctive treatment includes physical therapy. This regimen allows Adam Savage to function without excessive sedation or side effects. The benefits of continuing opioid therapy outweigh the risks and chronic opioids will be continued. Ongoing education about safe opioid treatment is provided.    - UDS next visit - C/w hydrocodone-acetaminophen for back pain - Could supplement w/ lidocaine patch as patient sometimes forget to ask for pain med - F/u in 3 months

## 2019-09-29 NOTE — Assessment & Plan Note (Signed)
Currently on insulin 28 units qhs, Novolog 10 units TID. Denies any hypoglycemic events. Has been having stable hgb a1c at 7-7.5 ever since moving into his ALF where his insurance administration is monitored and glucose checked regulary.  - C/w current therapy (Levemir 28 units qhs, Novolog 10 units TID qc) - Will space out hgb a1c monitoring

## 2019-09-29 NOTE — Assessment & Plan Note (Signed)
Pt requires refills on medications with associated diagnosis above.  Reviewed disease process and find this medication to be necessary, will not change dose or alter current therapy. 

## 2019-09-29 NOTE — Assessment & Plan Note (Signed)
Mentions hx of recent fluctuating mentation. On lactulose 10g BID for cirrhosis. No asterixis on exam. Currently has 1-2 bowel movements daily. Does not appear to have hepatic encephalopathy as contributing cause of his fluctuating mentation. Likely due to poor sleep from acute CHF exacerbation in addition to known dementia.   - C/w lactulose BID - f/u with Dr.Nandigam

## 2019-09-29 NOTE — Progress Notes (Signed)
CC: Confusion  HPI: Adam Savage is a 69 y.o. with PMH listed below presenting with complaint of confusion. Please see problem based assessment and plan for further details.  Past Medical History:  Diagnosis Date  . COPD (chronic obstructive pulmonary disease) ([place])   . Dementia ([place])   . Depression   . Diabetes mellitus without complication ([place])   . GERD (gastroesophageal reflux disease)   . H/O diverticulitis of colon 11/03/2012   History of partial colectomy in 2011.  Marland Kitchen Hepatitis C   . High cholesterol   . Lower extremity edema   . Mechanical heart valve present   . Overdose of muscle relaxant 08/11/2017  . Pre-diabetes   . Retroperitoneal hematoma 01/29/2018  . S/P AVR (aortic valve replacement)   . Seizures ([place])   . 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  Respiratory: Negative for cough and shortness of breath.   Cardiovascular: Positive for leg swelling. Negative for chest pain and palpitations.  Gastrointestinal: Negative for constipation, diarrhea, nausea and vomiting.  Genitourinary: Negative for dysuria.  Musculoskeletal: Positive for back pain.  Neurological: Negative for seizures.  Psychiatric/Behavioral: The patient is nervous/anxious and has insomnia.   All other systems reviewed and are negative.    Physical Exam: Vitals:   09/28/19 1341  BP: 124/60  Pulse: 71  Temp: 98.8 F (37.1 C)  TempSrc: Oral  SpO2: 98%  Weight: (!) 305 lb 1.6 oz (138.4 kg)  Height: 5\' 11"  (1.803 m)    Physical Exam  Constitutional: He is oriented to person, place, and time. He appears ill (chronically ill-appearing).  HENT:  Head: Normocephalic and atraumatic.  Eyes: Conjunctivae are normal.  Cardiovascular: Normal rate and regular rhythm.  Murmur heard. Respiratory: Effort normal. He has no wheezes. He has rales (bibasilar rales).  GI: Bowel sounds are normal. He exhibits  distension. There is no abdominal tenderness.  Musculoskeletal:        General: Swelling (2+ edema around ankles) present. Normal range of motion.  Neurological: He is alert and oriented to person, place, and time.  Skin: Skin is warm and dry.     Assessment & Plan:   Dementia associated with other underlying disease with behavioral disturbance [facility]) Adam Savage is a 69 yo M w/ PMH of HFpEF, mechanical AV on Coumadin, Cirrhosis, seizure disorder and Alzheimer's dementia presenting to [facility] for management of his chronic conditions. He was examined and evaluated with daughter present. Daughter mentions significant waxing and waning mentation with concerns regarding worsening dementia. She mentions that recently with his acute heart failure exacerbation, he had worsening difficulty with staying asleep and appeared much more confused during the day. Denies any falls, head trauma. Mentions at current visit his mentation appears much more improved than prior visit.  A/P Mini-Cog performed during visit with score of 3 which is significantly improved compared to prior visit. Waxing and waning mentation consistent w/ hx of dementia. Reassured family that this is expected. Also discussed likely nocturnal dyspnea contributing to increased confusion, with expected improvement after diuresis. Discussed increasing night time escitalopram to assist with insomnia as recommended by Dr.Aquino on recent visit. - Increase escitalopram to 20mg  qhs - Discuss sleep hygiene. 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 Seen about a week ago by Dr.Alexander. Noted to  be 15 pounds over dry weight with significant lower extremity edema. Per daughter, patient has been non-adherent to diet due to access to snacks at his facility. At last visit, increased to furosemide 80mg  BID.   A/P On exam,  appear to have significant improvement in lower extremity edema although about 3-5 pounds still over dry weight. Will check bmp this visit. If renal fx, stable would continue at current dose for the short term as he continues to endorse some edema. - C/w furosemide 80mg  BID, sprinolactone 50mg  daily - BMP  Hepatic cirrhosis due to chronic hepatitis C infection ([place]) Mentions hx of recent fluctuating mentation. On lactulose 10g BID for cirrhosis. No asterixis on exam. Currently has 1-2 bowel movements daily. Does not appear to have hepatic encephalopathy as contributing cause of his fluctuating mentation. Likely due to poor sleep from acute CHF exacerbation in addition to known dementia.   - C/w lactulose BID - f/u with Dr.Nandigam  Type II diabetes mellitus ([facility]) Currently on insulin 28 units qhs, Novolog 10 units TID. Denies any hypoglycemic events. Has been having stable hgb a1c at 7-7.5 ever since moving into his ALF where his insurance administration is monitored and glucose checked regulary.  - C/w current therapy (Levemir 28 units qhs, Novolog 10 units TID qc) - Will space out hgb a1c monitoring    Patient discussed with Dr. Philipp Ovens   -Adam Savage, PGY2 [facility] Pager: [phone]

## 2019-09-30 NOTE — Progress Notes (Signed)
Internal Medicine Clinic Attending ° °Case discussed with Dr. Lee at the time of the visit.  We reviewed the resident’s history and exam and pertinent patient test results.  I agree with the assessment, diagnosis, and plan of care documented in the resident’s note.  °

## 2019-10-10 DIAGNOSIS — Z5181 Encounter for therapeutic drug level monitoring: Secondary | ICD-10-CM | POA: Diagnosis not present

## 2019-10-10 DIAGNOSIS — I69398 Other sequelae of cerebral infarction: Secondary | ICD-10-CM | POA: Diagnosis not present

## 2019-10-10 DIAGNOSIS — F0391 Unspecified dementia with behavioral disturbance: Secondary | ICD-10-CM | POA: Diagnosis not present

## 2019-10-10 DIAGNOSIS — R2681 Unsteadiness on feet: Secondary | ICD-10-CM | POA: Diagnosis not present

## 2019-10-10 DIAGNOSIS — Z7901 Long term (current) use of anticoagulants: Secondary | ICD-10-CM | POA: Diagnosis not present

## 2019-10-10 DIAGNOSIS — E119 Type 2 diabetes mellitus without complications: Secondary | ICD-10-CM | POA: Diagnosis not present

## 2019-10-10 DIAGNOSIS — I110 Hypertensive heart disease with heart failure: Secondary | ICD-10-CM | POA: Diagnosis not present

## 2019-10-10 DIAGNOSIS — Z87891 Personal history of nicotine dependence: Secondary | ICD-10-CM | POA: Diagnosis not present

## 2019-10-10 DIAGNOSIS — M6281 Muscle weakness (generalized): Secondary | ICD-10-CM | POA: Diagnosis not present

## 2019-10-10 DIAGNOSIS — J449 Chronic obstructive pulmonary disease, unspecified: Secondary | ICD-10-CM | POA: Diagnosis not present

## 2019-10-10 DIAGNOSIS — Z794 Long term (current) use of insulin: Secondary | ICD-10-CM | POA: Diagnosis not present

## 2019-10-10 DIAGNOSIS — I5032 Chronic diastolic (congestive) heart failure: Secondary | ICD-10-CM | POA: Diagnosis not present

## 2019-10-10 DIAGNOSIS — I69311 Memory deficit following cerebral infarction: Secondary | ICD-10-CM | POA: Diagnosis not present

## 2019-10-11 ENCOUNTER — Ambulatory Visit (INDEPENDENT_AMBULATORY_CARE_PROVIDER_SITE_OTHER): Payer: Self-pay | Admitting: *Deleted

## 2019-10-11 DIAGNOSIS — Z5181 Encounter for therapeutic drug level monitoring: Secondary | ICD-10-CM

## 2019-10-11 DIAGNOSIS — I69398 Other sequelae of cerebral infarction: Secondary | ICD-10-CM | POA: Diagnosis not present

## 2019-10-11 DIAGNOSIS — M6281 Muscle weakness (generalized): Secondary | ICD-10-CM | POA: Diagnosis not present

## 2019-10-11 DIAGNOSIS — I110 Hypertensive heart disease with heart failure: Secondary | ICD-10-CM | POA: Diagnosis not present

## 2019-10-11 DIAGNOSIS — I5032 Chronic diastolic (congestive) heart failure: Secondary | ICD-10-CM | POA: Diagnosis not present

## 2019-10-11 DIAGNOSIS — I69311 Memory deficit following cerebral infarction: Secondary | ICD-10-CM | POA: Diagnosis not present

## 2019-10-11 DIAGNOSIS — R2681 Unsteadiness on feet: Secondary | ICD-10-CM | POA: Diagnosis not present

## 2019-10-11 DIAGNOSIS — Z952 Presence of prosthetic heart valve: Secondary | ICD-10-CM

## 2019-10-11 LAB — POCT INR: INR: 2.4 (ref 2.0–3.0)

## 2019-10-11 NOTE — Patient Instructions (Signed)
Continue warfarin 1 tablet daily.   Recheck INR in 3 weeks Order given to Katherine Basset RN [facility] and faxed to [place] [phone]

## 2019-10-19 DIAGNOSIS — R2681 Unsteadiness on feet: Secondary | ICD-10-CM | POA: Diagnosis not present

## 2019-10-19 DIAGNOSIS — I69311 Memory deficit following cerebral infarction: Secondary | ICD-10-CM | POA: Diagnosis not present

## 2019-10-19 DIAGNOSIS — M6281 Muscle weakness (generalized): Secondary | ICD-10-CM | POA: Diagnosis not present

## 2019-10-19 DIAGNOSIS — I5032 Chronic diastolic (congestive) heart failure: Secondary | ICD-10-CM | POA: Diagnosis not present

## 2019-10-19 DIAGNOSIS — I69398 Other sequelae of cerebral infarction: Secondary | ICD-10-CM | POA: Diagnosis not present

## 2019-10-19 DIAGNOSIS — I110 Hypertensive heart disease with heart failure: Secondary | ICD-10-CM | POA: Diagnosis not present

## 2019-10-20 ENCOUNTER — Other Ambulatory Visit: Payer: Self-pay | Admitting: *Deleted

## 2019-10-20 DIAGNOSIS — I5030 Unspecified diastolic (congestive) heart failure: Secondary | ICD-10-CM

## 2019-10-20 DIAGNOSIS — I38 Endocarditis, valve unspecified: Secondary | ICD-10-CM

## 2019-10-20 MED ORDER — CARVEDILOL 6.25 MG PO TABS: 6.2500 mg | ORAL_TABLET | Freq: Two times a day (BID) | ORAL | 1 refills | Status: DC

## 2019-10-25 DIAGNOSIS — I5032 Chronic diastolic (congestive) heart failure: Secondary | ICD-10-CM | POA: Diagnosis not present

## 2019-10-25 DIAGNOSIS — R2681 Unsteadiness on feet: Secondary | ICD-10-CM | POA: Diagnosis not present

## 2019-10-25 DIAGNOSIS — I69398 Other sequelae of cerebral infarction: Secondary | ICD-10-CM | POA: Diagnosis not present

## 2019-10-25 DIAGNOSIS — M6281 Muscle weakness (generalized): Secondary | ICD-10-CM | POA: Diagnosis not present

## 2019-10-25 DIAGNOSIS — I110 Hypertensive heart disease with heart failure: Secondary | ICD-10-CM | POA: Diagnosis not present

## 2019-10-25 DIAGNOSIS — I69311 Memory deficit following cerebral infarction: Secondary | ICD-10-CM | POA: Diagnosis not present

## 2019-10-26 DIAGNOSIS — L84 Corns and callosities: Secondary | ICD-10-CM | POA: Diagnosis not present

## 2019-10-26 DIAGNOSIS — E1051 Type 1 diabetes mellitus with diabetic peripheral angiopathy without gangrene: Secondary | ICD-10-CM | POA: Diagnosis not present

## 2019-11-05 ENCOUNTER — Telehealth: Payer: Self-pay | Admitting: Cardiovascular Disease

## 2019-11-05 ENCOUNTER — Ambulatory Visit (INDEPENDENT_AMBULATORY_CARE_PROVIDER_SITE_OTHER): Payer: Medicare Other | Admitting: Cardiovascular Disease

## 2019-11-05 DIAGNOSIS — Z5181 Encounter for therapeutic drug level monitoring: Secondary | ICD-10-CM

## 2019-11-05 DIAGNOSIS — I110 Hypertensive heart disease with heart failure: Secondary | ICD-10-CM | POA: Diagnosis not present

## 2019-11-05 DIAGNOSIS — I5032 Chronic diastolic (congestive) heart failure: Secondary | ICD-10-CM | POA: Diagnosis not present

## 2019-11-05 DIAGNOSIS — M6281 Muscle weakness (generalized): Secondary | ICD-10-CM | POA: Diagnosis not present

## 2019-11-05 DIAGNOSIS — I69398 Other sequelae of cerebral infarction: Secondary | ICD-10-CM | POA: Diagnosis not present

## 2019-11-05 DIAGNOSIS — I69311 Memory deficit following cerebral infarction: Secondary | ICD-10-CM | POA: Diagnosis not present

## 2019-11-05 DIAGNOSIS — R2681 Unsteadiness on feet: Secondary | ICD-10-CM | POA: Diagnosis not present

## 2019-11-05 LAB — POCT INR: INR: 2.9 (ref 2.0–3.0)

## 2019-11-05 NOTE — Telephone Encounter (Signed)
This is a Dr Scarlette Calico patient, has coumadin clinic in [place] but never seen any of the providers. Please refer to Dr Nanda Quinton MD

## 2019-11-05 NOTE — Patient Instructions (Signed)
Description    Spoke to [facility], Instructed for pt to continue warfarin 1 tablet daily. Order given to  [facility] recheck INR in 3 weeks. Fax order to [place] [phone]

## 2019-11-05 NOTE — Telephone Encounter (Signed)
New message    Adam Savage is calling in [place] results  INR is 2.9 , pt 33.0 , please call with orders   Patient has also gain weight - it is 309 today , last weight was 297 on 10/19/19 , there is no swelling or abdominal swelling and his lungs are clear

## 2019-11-07 ENCOUNTER — Emergency Department (HOSPITAL_COMMUNITY)
Admission: EM | Admit: 2019-11-07 | Discharge: 2019-11-07 | Disposition: A | Discharge status: Home / Self Care | Payer: Medicare Other | Attending: Emergency Medicine | Admitting: Emergency Medicine

## 2019-11-07 ENCOUNTER — Other Ambulatory Visit: Payer: Self-pay

## 2019-11-07 DIAGNOSIS — F0390 Unspecified dementia without behavioral disturbance: Secondary | ICD-10-CM | POA: Diagnosis not present

## 2019-11-07 DIAGNOSIS — S41112A Laceration without foreign body of left upper arm, initial encounter: Secondary | ICD-10-CM

## 2019-11-07 DIAGNOSIS — R58 Hemorrhage, not elsewhere classified: Secondary | ICD-10-CM | POA: Diagnosis not present

## 2019-11-07 DIAGNOSIS — Z79899 Other long term (current) drug therapy: Secondary | ICD-10-CM | POA: Diagnosis not present

## 2019-11-07 DIAGNOSIS — S51811A Laceration without foreign body of right forearm, initial encounter: Secondary | ICD-10-CM | POA: Diagnosis not present

## 2019-11-07 DIAGNOSIS — E119 Type 2 diabetes mellitus without complications: Secondary | ICD-10-CM | POA: Insufficient documentation

## 2019-11-07 DIAGNOSIS — Y929 Unspecified place or not applicable: Secondary | ICD-10-CM | POA: Diagnosis not present

## 2019-11-07 DIAGNOSIS — J441 Chronic obstructive pulmonary disease with (acute) exacerbation: Secondary | ICD-10-CM | POA: Diagnosis not present

## 2019-11-07 DIAGNOSIS — Z7901 Long term (current) use of anticoagulants: Secondary | ICD-10-CM | POA: Diagnosis not present

## 2019-11-07 DIAGNOSIS — F17210 Nicotine dependence, cigarettes, uncomplicated: Secondary | ICD-10-CM | POA: Diagnosis not present

## 2019-11-07 DIAGNOSIS — D696 Thrombocytopenia, unspecified: Secondary | ICD-10-CM

## 2019-11-07 DIAGNOSIS — Y939 Activity, unspecified: Secondary | ICD-10-CM | POA: Insufficient documentation

## 2019-11-07 DIAGNOSIS — S51812A Laceration without foreign body of left forearm, initial encounter: Secondary | ICD-10-CM | POA: Diagnosis not present

## 2019-11-07 DIAGNOSIS — W010XXA Fall on same level from slipping, tripping and stumbling without subsequent striking against object, initial encounter: Secondary | ICD-10-CM | POA: Insufficient documentation

## 2019-11-07 DIAGNOSIS — S41111A Laceration without foreign body of right upper arm, initial encounter: Secondary | ICD-10-CM | POA: Diagnosis not present

## 2019-11-07 DIAGNOSIS — W19XXXA Unspecified fall, initial encounter: Secondary | ICD-10-CM | POA: Diagnosis not present

## 2019-11-07 DIAGNOSIS — Z96651 Presence of right artificial knee joint: Secondary | ICD-10-CM | POA: Insufficient documentation

## 2019-11-07 DIAGNOSIS — Y999 Unspecified external cause status: Secondary | ICD-10-CM | POA: Diagnosis not present

## 2019-11-07 DIAGNOSIS — Z794 Long term (current) use of insulin: Secondary | ICD-10-CM | POA: Diagnosis not present

## 2019-11-07 DIAGNOSIS — I959 Hypotension, unspecified: Secondary | ICD-10-CM | POA: Diagnosis not present

## 2019-11-07 DIAGNOSIS — R52 Pain, unspecified: Secondary | ICD-10-CM | POA: Diagnosis not present

## 2019-11-07 DIAGNOSIS — D649 Anemia, unspecified: Secondary | ICD-10-CM | POA: Diagnosis not present

## 2019-11-07 LAB — CBC WITH DIFFERENTIAL/PLATELET
Abs Immature Granulocytes: 0.03 10*3/uL (ref 0.00–0.07)
Basophils Absolute: 0 10*3/uL (ref 0.0–0.1)
Basophils Relative: 1 %
Eosinophils Absolute: 0.1 10*3/uL (ref 0.0–0.5)
Eosinophils Relative: 1 %
HCT: 28.2 % — ABNORMAL LOW (ref 39.0–52.0)
Hemoglobin: 8.3 g/dL — ABNORMAL LOW (ref 13.0–17.0)
Immature Granulocytes: 0 %
Lymphocytes Relative: 15 %
Lymphs Abs: 1.1 10*3/uL (ref 0.7–4.0)
MCH: 21.6 pg — ABNORMAL LOW (ref 26.0–34.0)
MCHC: 29.4 g/dL — ABNORMAL LOW (ref 30.0–36.0)
MCV: 73.2 fL — ABNORMAL LOW (ref 80.0–100.0)
Monocytes Absolute: 0.7 10*3/uL (ref 0.1–1.0)
Monocytes Relative: 10 %
Neutro Abs: 5.3 10*3/uL (ref 1.7–7.7)
Neutrophils Relative %: 73 %
Platelets: 93 10*3/uL — ABNORMAL LOW (ref 150–400)
RBC: 3.85 MIL/uL — ABNORMAL LOW (ref 4.22–5.81)
RDW: 18.9 % — ABNORMAL HIGH (ref 11.5–15.5)
WBC: 7.3 10*3/uL (ref 4.0–10.5)
nRBC: 0 % (ref 0.0–0.2)

## 2019-11-07 LAB — BASIC METABOLIC PANEL
Anion gap: 8 (ref 5–15)
BUN: 25 mg/dL — ABNORMAL HIGH (ref 8–23)
CO2: 28 mmol/L (ref 22–32)
Calcium: 8.4 mg/dL — ABNORMAL LOW (ref 8.9–10.3)
Chloride: 99 mmol/L (ref 98–111)
Creatinine, Ser: 1.59 mg/dL — ABNORMAL HIGH (ref 0.61–1.24)
GFR calc Af Amer: 51 mL/min — ABNORMAL LOW (ref >60)
GFR calc non Af Amer: 44 mL/min — ABNORMAL LOW (ref >60)
Glucose, Bld: 238 mg/dL — ABNORMAL HIGH (ref 70–99)
Potassium: 3.6 mmol/L (ref 3.5–5.1)
Sodium: 135 mmol/L (ref 135–145)

## 2019-11-07 LAB — PROTIME-INR
INR: 2.7 — ABNORMAL HIGH (ref 0.8–1.2)
Prothrombin Time: 28 seconds — ABNORMAL HIGH (ref 11.4–15.2)

## 2019-11-07 MED ORDER — LIDOCAINE-EPINEPHRINE (PF) 1 %-1:200000 IJ SOLN
30.0000 mL | Freq: Once | INTRAMUSCULAR | Status: AC
  Administered 2019-11-07: 30 mL
  Filled 2019-11-07: qty 30

## 2019-11-07 MED ORDER — POVIDONE-IODINE 10 % EX SOLN
CUTANEOUS | Status: AC
  Filled 2019-11-07: qty 15

## 2019-11-07 MED ORDER — LIDOCAINE-EPINEPHRINE (PF) 2 %-1:200000 IJ SOLN
INTRAMUSCULAR | Status: AC
  Filled 2019-11-07: qty 20

## 2019-11-07 MED ORDER — SODIUM CHLORIDE 0.9 % IV BOLUS
500.0000 mL | Freq: Once | INTRAVENOUS | Status: AC
  Administered 2019-11-07: 500 mL via INTRAVENOUS

## 2019-11-07 MED ORDER — ACETAMINOPHEN 500 MG PO TABS
1000.0000 mg | ORAL_TABLET | Freq: Once | ORAL | Status: AC
  Administered 2019-11-07: 1000 mg via ORAL
  Filled 2019-11-07: qty 2

## 2019-11-07 NOTE — ED Provider Notes (Signed)
[facility] EMERGENCY DEPARTMENT Provider Note   CSN: [id number] Arrival date & time: 11/07/19  1931     History Chief Complaint  Patient presents with  . Fall    Adam Savage is a 69 y.o. male.  Pt presents to the ED today with a fall.  Pt said he fell over his walker.  He sustained lacerations to both arms.  He is on coumadin, but did not hit his head.  Pt denies any other injuries.        Past Medical History:  Diagnosis Date  . COPD (chronic obstructive pulmonary disease) ([place])   . Dementia ([place])   . Depression   . Diabetes mellitus without complication ([place])   . GERD (gastroesophageal reflux disease)   . H/O diverticulitis of colon 11/03/2012   History of partial colectomy in 2011.  Marland Kitchen Hepatitis C   . High cholesterol   . Lower extremity edema   . Mechanical heart valve present   . Overdose of muscle relaxant 08/11/2017  . Pre-diabetes   . Retroperitoneal hematoma 01/29/2018  . S/P AVR (aortic valve replacement)   . Seizures ([place])   . 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 Microcytic anemia 06/17/2019  . Chronic, continuous use of opioids 06/17/2019  . Hepatic cirrhosis due to chronic hepatitis C infection ([place]) 09/16/2018  . Compression fracture of L1 lumbar vertebra ([place]) 06/18/2018  . H/O: GI bleed 06/18/2018  . Dementia associated with other underlying disease with behavioral disturbance ([place])   . S/P AVR (aortic valve replacement)   . Chronic hepatitis C without hepatic coma ([place]) 01/12/2018  . 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 Chronic anticoagulation   . Seizure disorder ([place])   . COPD (chronic obstructive pulmonary disease) ([place])   . Type II diabetes mellitus ([place])   . Obesity 09/21/2015  . OSA (obstructive sleep apnea) 05/05/2015  . Tension headache, chronic 08/25/2013    Past Surgical History:  Procedure Laterality Date  . CARDIAC SURGERY    .  CHOLECYSTECTOMY    . COLON SURGERY    . CORONARY/GRAFT ANGIOGRAPHY N/A 10/31/2017   Procedure: CORONARY/GRAFT ANGIOGRAPHY;  Surgeon: Nelva Bush, MD;  Location: [facility];  Service: Cardiovascular;  Laterality: N/A;  . ESOPHAGOGASTRODUODENOSCOPY (EGD) WITH PROPOFOL N/A 01/18/2018   Procedure: ESOPHAGOGASTRODUODENOSCOPY (EGD) WITH PROPOFOL;  Surgeon: Mauri Pole, MD;  Location: [facility];  Service: Endoscopy;  Laterality: N/A;  . JOINT REPLACEMENT    . KNEE SURGERY Left   . tooth pulled  2021       Family History  Problem Relation Age of Onset  . Hypertension Father     Social History   Tobacco Use  . Smoking status: Former Smoker    Packs/day: 1.00    Types: Cigarettes  . Smokeless tobacco: Never Used  Vaping Use  . Vaping Use: Never used  Substance Use Topics  . Alcohol use: Not Currently  . Drug use: Never    Home Medications Prior to Admission medications   Medication Sig Start Date End Date Taking? Authorizing Provider  LEVEMIR FLEXTOUCH 100 UNIT/ML FlexPen INJECT 28 UNITS UNDER THE SKIN AT BEDTIME 09/06/19   [name], MD  albuterol (VENTOLIN HFA) 108 (90 Base) MCG/ACT inhaler Inhale 2 puffs into the lungs every 4 (four) hours as needed for wheezing or shortness of breath. 11/06/18   [name], MD  atorvastatin (LIPITOR)  10 MG tablet Take 1 tablet (10 mg total) by mouth daily. 08/03/19   [name], MD  carvedilol (COREG) 6.25 MG tablet Take 1 tablet (6.25 mg total) by mouth 2 (two) times daily with a meal. 10/20/19   [name], MD  escitalopram (LEXAPRO) 10 MG tablet Take 2 tablets (20 mg total) by mouth at bedtime. 09/28/19   [name], MD  furosemide (LASIX) 40 MG tablet Take 2 tablets (80 mg total) by mouth 2 (two) times daily. Please take 80mg  twice a day 09/22/19   Earlene Plater, MD  glucose blood test strip Test blood sugar 3 times daily before meals 06/22/19   [name], MD  HYDROcodone-acetaminophen (NORCO/VICODIN) 5-325  MG tablet Take 1 tablet by mouth every 6 (six) hours as needed for moderate pain. 09/20/19   [name], MD  insulin aspart (NOVOLOG FLEXPEN) 100 UNIT/ML FlexPen INJECT 10 UNITS INTO THE SKIN 3 (THREE) TIMES DAILY WITH MEALS. ADJUST DOSE AS NEEDED 03/19/19   [name], MD  insulin detemir (LEVEMIR) 100 UNIT/ML injection Inject 0.28 mLs (28 Units total) into the skin at bedtime. Increase Levemir by 2 units when the week average is >200. 11/06/18   [name], MD  Insulin Pen Needle (ADVOCATE INSULIN PEN NEEDLES) 31G X 5 MM MISC 1 Package by Does not apply route 4 (four) times daily. 02/03/19   [name], MD  lactulose (CHRONULAC) 10 GM/15ML solution Take 15 mLs (10 g total) by mouth 2 (two) times daily. 06/15/19   [name], MD  Lancets MISC 1 Stick by Does not apply route 3 (three) times daily with meals. 06/22/19   [name], MD  levETIRAcetam (KEPPRA) 1000 MG tablet Take 1 tablet (1,000 mg total) by mouth 2 (two) times daily. 09/17/18   Cameron Sprang, MD  memantine (NAMENDA) 5 MG tablet Take 1 tablet (5 mg total) by mouth 2 (two) times daily. 09/17/18   Cameron Sprang, MD  pantoprazole (PROTONIX) 40 MG tablet Take 1 tablet (40 mg total) by mouth daily. 07/14/18   [name], MD  QUEtiapine (SEROQUEL) 100 MG tablet TAKE 1.5 TABLETS IN THE EVENING 08/16/19   Cameron Sprang, MD  QUEtiapine (SEROQUEL) 25 MG tablet Take 1 tablet in AM 08/16/19   Cameron Sprang, MD  spironolactone (ALDACTONE) 50 MG tablet Take 50 mg by mouth daily.    [provider]  warfarin (COUMADIN) 4 MG tablet Take 1 tablet (4 mg total) by mouth daily. 07/14/18   [name], MD    Allergies    Oxycodone, Ativan [lorazepam], and Morphine and related  Review of Systems   Review of Systems  Skin: Positive for wound.  All other systems reviewed and are negative.   Physical Exam Updated Vital Signs BP 125/67 (BP Location: Left Arm)   Pulse 76   Temp 98.5 F (36.9 C) (Oral)   Resp 18   Ht 5\' 11"   (1.803 m)   Wt (!) 140.2 kg   SpO2 97%   BMI 43.10 kg/m   Physical Exam Vitals and nursing note reviewed.  Constitutional:      Appearance: Normal appearance.  HENT:     Head: Normocephalic and atraumatic.     Right Ear: External ear normal.     Left Ear: External ear normal.     Nose: Nose normal.     Mouth/Throat:     Mouth: Mucous membranes are moist.  Pharynx: Oropharynx is clear.  Eyes:     Extraocular Movements: Extraocular movements intact.     Conjunctiva/sclera: Conjunctivae normal.     Pupils: Pupils are equal, round, and reactive to light.  Cardiovascular:     Rate and Rhythm: Normal rate and regular rhythm.     Pulses: Normal pulses.     Heart sounds: Normal heart sounds.  Pulmonary:     Effort: Pulmonary effort is normal.     Breath sounds: Normal breath sounds.  Abdominal:     General: Abdomen is flat. Bowel sounds are normal.     Palpations: Abdomen is soft.  Musculoskeletal:        General: Normal range of motion.     Cervical back: Normal range of motion and neck supple.  Skin:    Capillary Refill: Capillary refill takes less than 2 seconds.     Comments: 7 cm lac to left forearm 12 cm lac to right forearm with some skin tears and skin missing.  Neurological:     General: No focal deficit present.     Mental Status: He is alert and oriented to person, place, and time.  Psychiatric:        Mood and Affect: Mood normal.        Behavior: Behavior normal.     ED Results / Procedures / Treatments   Labs (all labs ordered are listed, but only abnormal results are displayed) Labs Reviewed  BASIC METABOLIC PANEL - Abnormal; Notable for the following components:      Result Value   Glucose, Bld 238 (*)    BUN 25 (*)    Creatinine, Ser 1.59 (*)    Calcium 8.4 (*)    GFR calc non Af Amer 44 (*)    GFR calc Af Amer 51 (*)    All other components within normal limits  CBC WITH DIFFERENTIAL/PLATELET - Abnormal; Notable for the following components:     RBC 3.85 (*)    Hemoglobin 8.3 (*)    HCT 28.2 (*)    MCV 73.2 (*)    MCH 21.6 (*)    MCHC 29.4 (*)    RDW 18.9 (*)    Platelets 93 (*)    All other components within normal limits  PROTIME-INR - Abnormal; Notable for the following components:   Prothrombin Time 28.0 (*)    INR 2.7 (*)    All other components within normal limits    EKG None  Radiology No results found.  Procedures Procedures (including critical care time)  Medications Ordered in ED Medications  povidone-iodine (BETADINE) 10 % external solution (  Given 11/07/19 2129)  lidocaine-EPINEPHrine (XYLOCAINE-EPINEPHrine) 1 %-1:200000 (PF) injection 30 mL (30 mLs Other Given 11/07/19 2059)  sodium chloride 0.9 % bolus 500 mL (500 mLs Intravenous New Bag/Given 11/07/19 2128)  acetaminophen (TYLENOL) tablet 1,000 mg (1,000 mg Oral Given 11/07/19 2128)    ED Course  I have reviewed the triage vital signs and the nursing notes.  Pertinent labs & imaging results that were available during my care of the patient were reviewed by me and considered in my medical decision making (see chart for details).    MDM Rules/Calculators/A&P                          Pt is anemic and thrombocytopenic which is chronic for pt.  PA Idol sutured lacerations as well as possible.  Dressings applied.  Pt given a referral  for wound care at Dr. Eusebio Friendly office.  Pt to return if worse.   Final Clinical Impression(s) / ED Diagnoses Final diagnoses:  Fall, initial encounter  Laceration of right upper extremity, initial encounter  Laceration of left upper arm, initial encounter  Anticoagulated on Coumadin  Anemia, unspecified type  Thrombocytopenia [facility])    Rx / DC Orders ED Discharge Orders         Ordered    Ambulatory referral to Plastic Surgery     Discontinue  Reprint    Comments: Wound care   11/07/19 2203           Isla Pence, MD 11/07/19 2248

## 2019-11-07 NOTE — ED Provider Notes (Signed)
I was asked to suture patients lacerations bilateral forearms by Dr. Gilford Raid.  LACERATION REPAIR left volar forearm Performed by: Evalee Jefferson Authorized by: Evalee Jefferson Consent: Verbal consent obtained. Risks and benefits: risks, benefits and alternatives were discussed Consent given by: patient Patient identity confirmed: provided demographic data Prepped and Draped in normal sterile fashion Wound explored  Laceration Location: left forearm  Laceration Length: 7cm  No Foreign Bodies seen or palpated  Anesthesia: local infiltration  Local anesthetic: lidocaine 1% with epinephrine  Anesthetic total: 7 ml  Irrigation method: syringe Amount of cleaning: standard  Skin closure: ethilon 4-0  Number of sutures: 17  Technique: running  Patient tolerance: Patient tolerated the procedure well with no immediate complications.  LACERATION REPAIR right forearm  COMPLICATED WOUND, flap laceration within a stellate like laceration Performed by: Evalee Jefferson Authorized by: Evalee Jefferson Consent: Verbal consent obtained. Risks and benefits: risks, benefits and alternatives were discussed Consent given by: patient Patient identity confirmed: provided demographic data Prepped and Draped in normal sterile fashion Wound explored  Laceration Location: right forearm  Laceration Length: 12 cm  No Foreign Bodies seen or palpated  Anesthesia: local infiltration  Local anesthetic: lidocaine 1% with epinephrine  Anesthetic total: 12 ml  Irrigation method: syringe Amount of cleaning: standard  Skin closure: ethilon 4-0  Number of sutures: 13 simple interrupted sutures.  The entirety of the wound was not fully approximated secondary to edema and superficial nature of portions of the wound edges  Technique: simple interrupted   LACERATION REPAIR Performed by: Evalee Jefferson Authorized by: Evalee Jefferson Consent: Verbal consent obtained. Risks and benefits: risks, benefits and alternatives  were discussed Consent given by: patient Patient identity confirmed: provided demographic data Prepped and Draped in normal sterile fashion Wound explored  Laceration Location: right forearm, distal to and horizontal with the larger wound above  Laceration Length: 5 cm  No Foreign Bodies seen or palpated  Anesthesia: local infiltration  Local anesthetic: lidocaine 1% with epinephrine  Anesthetic total: 3 ml  Irrigation method: syringe Amount of cleaning: standard  Skin closure: ethilon 4-0  Number of sutures: 11  Technique: running sutures  Patient tolerance: Patient tolerated the procedure well with no immediate complications.   Patient tolerance: Patient tolerated the procedure well with no immediate complications.     Evalee Jefferson, PA-C 11/07/19 2228    Isla Pence, MD 11/07/19 2248

## 2019-11-07 NOTE — ED Notes (Signed)
Applied pressure dressings to bilateral arms to decrease bleeding and cover sutured wounds, nursing home given instructions on wound care.

## 2019-11-07 NOTE — ED Notes (Signed)
Pt unable to sign & pt too anxious to leave to sign at nurses station

## 2019-11-07 NOTE — ED Triage Notes (Signed)
Ems called to [facility] after pt sustained a fall over his walker and pt has lacerations to both forearms. Currently wrapped at this time and bleeding controlled.

## 2019-11-08 ENCOUNTER — Telehealth: Payer: Self-pay | Admitting: *Deleted

## 2019-11-08 ENCOUNTER — Telehealth: Payer: Self-pay

## 2019-11-08 DIAGNOSIS — I110 Hypertensive heart disease with heart failure: Secondary | ICD-10-CM | POA: Diagnosis not present

## 2019-11-08 DIAGNOSIS — I69398 Other sequelae of cerebral infarction: Secondary | ICD-10-CM | POA: Diagnosis not present

## 2019-11-08 DIAGNOSIS — I5032 Chronic diastolic (congestive) heart failure: Secondary | ICD-10-CM | POA: Diagnosis not present

## 2019-11-08 DIAGNOSIS — I69311 Memory deficit following cerebral infarction: Secondary | ICD-10-CM | POA: Diagnosis not present

## 2019-11-08 DIAGNOSIS — R2681 Unsteadiness on feet: Secondary | ICD-10-CM | POA: Diagnosis not present

## 2019-11-08 DIAGNOSIS — M6281 Muscle weakness (generalized): Secondary | ICD-10-CM | POA: Diagnosis not present

## 2019-11-08 NOTE — Telephone Encounter (Signed)
Spoke with Mrs.Caryl Pina, Mr.Maslanka's daughter, regarding his recent ED visit. She states that he has had multiple falls recently from his bed at [facility] and on his latest fall, had scraped himself on his walker and had to be taken to ED for stitches. She was informed that he would need f/u with plastics due to the size of the incision and expressed concern regarding need for antibiotics as ED providers had not prescribed any. She also expresses concerns regarding frequency of his falls and any advice on how to prevent falls.  Discussed with Mrs.Caryl Pina regarding data from his ED visit and unclear evidence of infection his wound site at the moment and advised that I will have f/u appointment with him to evaluate for need for antibiotics. Also discussed regarding [facility]'s policy regarding guard-rails and inability to put guard-rails up despite the falls. Discussed that I will discuss case with geriatrics and try to come up with an alternative plan for fall prevention but the best intervention would be to d/c some of his central-acting meds, which unfortunately may cause exacerbation of his chronic pain. Mrs.Caryl Pina expressed understanding.

## 2019-11-08 NOTE — Telephone Encounter (Signed)
Orders faxed to [phone] (under Letters). SChaplin, RN,BSN

## 2019-11-08 NOTE — Telephone Encounter (Signed)
Coumadin clinic.   COntinue to monitor for signs of fluid overload such as swelling or shortness of breath.  COntinue to monotor weight.   When is f/u appt?  JV

## 2019-11-08 NOTE — Telephone Encounter (Signed)
Yes agree with order for daily dressing changes and dressing supplies

## 2019-11-08 NOTE — Telephone Encounter (Signed)
Received call from pt's daughter (Ashely)-pt had fall last night and was seen at [facility], states pt received about 40 stitches and now has to follow up with both wound care and plastic surgery.  Dtr now requesting call from pt's pcp to discuss.  CMA d/w Dr Leslee Home will call pt's dtr today.Regenia Skeeter, Daivion Pape Cassady7/26/20219:01 AM

## 2019-11-08 NOTE — Telephone Encounter (Signed)
Received TC from Nurse Kathlee Nations / [facility] who is at patient's assisted living facility.  States patient was seen in ED yesterday for a fall, lacerations to bilateral arms, and has appt w plastic surgery on Thursday for wound care.   Nurse states right arm wound has bled through dressing, she looked at wound and denies any active bleeding.  She is requesting orders for daily dressing changes until patient is evaluated on Thursday.  She would like orders for: Xeroform gauze to Right arm wound, non adhesive pads, followed by dry gauze, and Kerlix wrap changed daily. Facility needs this faxed to them at [phone] [place]).  Will forward to Dr. Blythe Stanford team and if approved, triage can fax orders for MD. Thank you, SChaplin, RN,BSN

## 2019-11-08 NOTE — Telephone Encounter (Signed)
Attempted appr 4 times to fax orders, called [facility], the office is closed, will call again in am

## 2019-11-09 ENCOUNTER — Other Ambulatory Visit: Payer: Self-pay | Admitting: *Deleted

## 2019-11-09 DIAGNOSIS — I110 Hypertensive heart disease with heart failure: Secondary | ICD-10-CM | POA: Diagnosis not present

## 2019-11-09 DIAGNOSIS — R2681 Unsteadiness on feet: Secondary | ICD-10-CM | POA: Diagnosis not present

## 2019-11-09 DIAGNOSIS — I5032 Chronic diastolic (congestive) heart failure: Secondary | ICD-10-CM | POA: Diagnosis not present

## 2019-11-09 DIAGNOSIS — J449 Chronic obstructive pulmonary disease, unspecified: Secondary | ICD-10-CM | POA: Diagnosis not present

## 2019-11-09 DIAGNOSIS — S32010S Wedge compression fracture of first lumbar vertebra, sequela: Secondary | ICD-10-CM

## 2019-11-09 DIAGNOSIS — Z7901 Long term (current) use of anticoagulants: Secondary | ICD-10-CM | POA: Diagnosis not present

## 2019-11-09 DIAGNOSIS — I69398 Other sequelae of cerebral infarction: Secondary | ICD-10-CM | POA: Diagnosis not present

## 2019-11-09 DIAGNOSIS — E119 Type 2 diabetes mellitus without complications: Secondary | ICD-10-CM | POA: Diagnosis not present

## 2019-11-09 DIAGNOSIS — Z87891 Personal history of nicotine dependence: Secondary | ICD-10-CM | POA: Diagnosis not present

## 2019-11-09 DIAGNOSIS — Z5181 Encounter for therapeutic drug level monitoring: Secondary | ICD-10-CM | POA: Diagnosis not present

## 2019-11-09 DIAGNOSIS — Z794 Long term (current) use of insulin: Secondary | ICD-10-CM | POA: Diagnosis not present

## 2019-11-09 DIAGNOSIS — M6281 Muscle weakness (generalized): Secondary | ICD-10-CM | POA: Diagnosis not present

## 2019-11-09 DIAGNOSIS — I69311 Memory deficit following cerebral infarction: Secondary | ICD-10-CM | POA: Diagnosis not present

## 2019-11-09 DIAGNOSIS — F0391 Unspecified dementia with behavioral disturbance: Secondary | ICD-10-CM | POA: Diagnosis not present

## 2019-11-09 MED ORDER — HYDROCODONE-ACETAMINOPHEN 5-325 MG PO TABS: 1.0000 | ORAL_TABLET | Freq: Four times a day (QID) | ORAL | 0 refills | Status: DC | PRN

## 2019-11-09 NOTE — Telephone Encounter (Signed)
Late entry due to Epic downtime:  Upon review of chart the patient has been rescheduled to see Dr. Irish Lack on 8/24 per scheduler due to scheduling conflict per [facility] with Nanine Means [phone]).

## 2019-11-09 NOTE — Telephone Encounter (Signed)
Pt does not currently have any follow up scheduled with cardiology. He is overdue to see you (last seen Feb 2020 and was supposed to follow up in 6 months). I'll forward to scheduling to reach out to pt.

## 2019-11-09 NOTE — Telephone Encounter (Signed)
Patient is scheduled 11/17/2019 at 10:15 am with Ermalinda Barrios

## 2019-11-10 ENCOUNTER — Other Ambulatory Visit: Payer: Self-pay

## 2019-11-10 ENCOUNTER — Encounter: Payer: Self-pay | Admitting: Internal Medicine

## 2019-11-10 ENCOUNTER — Ambulatory Visit (INDEPENDENT_AMBULATORY_CARE_PROVIDER_SITE_OTHER): Payer: Medicare Other | Admitting: Internal Medicine

## 2019-11-10 DIAGNOSIS — I5030 Unspecified diastolic (congestive) heart failure: Secondary | ICD-10-CM | POA: Diagnosis not present

## 2019-11-10 DIAGNOSIS — S32010S Wedge compression fracture of first lumbar vertebra, sequela: Secondary | ICD-10-CM

## 2019-11-10 DIAGNOSIS — F02818 Dementia in other diseases classified elsewhere, unspecified severity, with other behavioral disturbance: Secondary | ICD-10-CM

## 2019-11-10 DIAGNOSIS — F0281 Dementia in other diseases classified elsewhere with behavioral disturbance: Secondary | ICD-10-CM

## 2019-11-10 DIAGNOSIS — W06XXXA Fall from bed, initial encounter: Secondary | ICD-10-CM | POA: Insufficient documentation

## 2019-11-10 DIAGNOSIS — I38 Endocarditis, valve unspecified: Secondary | ICD-10-CM | POA: Diagnosis not present

## 2019-11-10 DIAGNOSIS — W06XXXD Fall from bed, subsequent encounter: Secondary | ICD-10-CM | POA: Diagnosis not present

## 2019-11-10 DIAGNOSIS — G40909 Epilepsy, unspecified, not intractable, without status epilepticus: Secondary | ICD-10-CM

## 2019-11-10 MED ORDER — HYDROCODONE-ACETAMINOPHEN 5-325 MG PO TABS: 1.0000 | ORAL_TABLET | Freq: Two times a day (BID) | ORAL | 0 refills | Status: DC | PRN

## 2019-11-10 MED ORDER — ESCITALOPRAM OXALATE 10 MG PO TABS: 10.0000 mg | ORAL_TABLET | Freq: Every day | ORAL | 3 refills | Status: DC

## 2019-11-10 MED ORDER — QUETIAPINE FUMARATE 100 MG PO TABS: ORAL_TABLET | ORAL | 3 refills | Status: DC

## 2019-11-10 NOTE — Assessment & Plan Note (Signed)
Recent ED visit shows stable renal function and electrolytes. Per Sharyn Lull, current weight down to 303lbs from 309 noted on prior visit. Also have been doing better with limited access to salty snacks at the facility. Adam Savage denies any orthopnea or dyspnea.  - C/w furosemide 80mg  BID, spironolactone 50mg  daily

## 2019-11-10 NOTE — Assessment & Plan Note (Addendum)
Continuing to endorse waxing and waning mentation. Mini-Cog last performed w/ score of 3. TSH checked on last visit which was wnl. Had a bad fall recently with concern for medication-induced confusion. Will decrease central acting meds and add back on only as needed.  - D/c memantine, reduce seroquel, escitalopram dose

## 2019-11-10 NOTE — Assessment & Plan Note (Signed)
Mr.Spieker presents to [facility] via telehealth visit after fall from bed. Had f/u in ED with findings of large open wounds requiring 17 sutures. Has upcoming f/u with plastics surgery. Fall occurred during night time, Mr.Gotschall states he was asleep at the time but he does have hx of dementia and he may not remember waking up. Hard to imagine the extend of the injury being caused by fall from low-height hospital bed. Concerned that he may be having delirium at night time. Currently on multiple central-acting medications. Will try to d/c meds and add back on only as needed.  A/P Reduce frequency of pain meds Reduce lexapro to 10mg  qhs Reduce night time seroquel to 100mg  qhs D/c memantine F/u with plastics

## 2019-11-10 NOTE — Assessment & Plan Note (Signed)
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 As part of the treatment plan, the [place] controlled substance database is checked at least twice yearly and the database results was checked 11/10/19 and was found not to be updated. [facility] was contacted and was able to verify proper dispense dates.   The last UDS was on 06/2018 and results were as expected. Patient needs a yearly UDS.  Pain contract was not signed due to dementia. Through conversation with his daughter, Adam Savage describes commitment to avoid excessive opioid use and only relying on pain medications to pursue goal of being able to interact with his grandkids.  The patient is on hydrocodone-acetaminopehn, 5-325mg  60 per 30 days. Adjunctive treatment includes physical therapy. This regimen allows Adam Savage to function without excessive sedation or side effects. The benefits of continuing opioid therapy outweigh the risks and chronic opioids will be continued. Ongoing education about safe opioid treatment is provided.    - Advised to decrease frequency of his pain meds due to falls (will reduce monthly supply to #30) - F/u in 3 months - UDS on next in-person appointment

## 2019-11-10 NOTE — Progress Notes (Signed)
  [facility] Telephone Encounter Continuity Care Appointment  HPI:   This telephone encounter was created for [name] on 11/10/2019 for the following purpose/cc: Fall.  Adam Savage was evaluated via telehealth visit for post-ED visit follow up. Adam Savage was evaluated with the assistance of the nurse manager at [facility]. He mentions that after the fall he has been continuing to endorse pain of his chest. He mentions it feels like a soreness and wonders if he has a broken rib. He denies any shortness of breath or chest pain or palpitations but feels he should have received a chest X-ray in the ED. When asked about his medications, he mentions not taking his pain medication every day and only taking it as needed 'on bad days' He denies any significant weakness or confusion.   Sharyn Lull, the nurse manager, mentions that he has an upcoming appointment with plastic surgery. She also mentions that his daughter was able to obtain a bariatric hospital bed for him to hope to keep him in bed. His wounds are currently being managed by home health wound care and no evidence of infection is seen currently.   Past Medical History:  Past Medical History:  Diagnosis Date  . COPD (chronic obstructive pulmonary disease) ([place])   . Dementia ([place])   . Depression   . Diabetes mellitus without complication ([place])   . GERD (gastroesophageal reflux disease)   . H/O diverticulitis of colon 11/03/2012   History of partial colectomy in 2011.  Marland Kitchen Hepatitis C   . High cholesterol   . Lower extremity edema   . Mechanical heart valve present   . Overdose of muscle relaxant 08/11/2017  . Pre-diabetes   . Retroperitoneal hematoma 01/29/2018  . S/P AVR (aortic valve replacement)   . Seizures ([place])   . 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  Respiratory: Negative for  shortness of breath.   Cardiovascular: Positive for leg swelling. Negative for chest pain and palpitations.  Gastrointestinal: Negative for diarrhea, nausea and vomiting.  Musculoskeletal: Positive for falls. Negative for back pain, joint pain and neck pain.  Neurological: Negative for dizziness, focal weakness and headaches.  All other systems reviewed and are negative.    Assessment / Plan / Recommendations:   Please see A&P under problem oriented charting for assessment of the patient's acute and chronic medical conditions.   As always, pt is advised that if symptoms worsen or new symptoms arise, they should go to an urgent care facility or to to ER for further evaluation.   Consent and Medical Decision Making:   Patient discussed with Dr. Evette Doffing  This is a telephone encounter between Adam Savage and [name] on 11/10/2019 for fall. The visit was conducted with the patient located at home and [name] at [facility]. The patient's identity was confirmed using their DOB and current address. The patient has consented to being evaluated through a telephone encounter and understands the associated risks (an examination cannot be done and the patient may need to come in for an appointment) / benefits (allows the patient to remain at home, decreasing exposure to coronavirus). I personally spent 25 minutes on medical discussion.

## 2019-11-11 ENCOUNTER — Other Ambulatory Visit: Payer: Self-pay

## 2019-11-11 ENCOUNTER — Ambulatory Visit (INDEPENDENT_AMBULATORY_CARE_PROVIDER_SITE_OTHER): Payer: Medicare Other | Admitting: Internal Medicine

## 2019-11-11 ENCOUNTER — Encounter: Payer: Self-pay | Admitting: Internal Medicine

## 2019-11-11 VITALS — BP 130/65 | HR 65 | Temp 97.5°F | Ht 72.0 in | Wt 306.0 lb

## 2019-11-11 DIAGNOSIS — S41101A Unspecified open wound of right upper arm, initial encounter: Secondary | ICD-10-CM

## 2019-11-11 DIAGNOSIS — S41102A Unspecified open wound of left upper arm, initial encounter: Secondary | ICD-10-CM

## 2019-11-11 MED ORDER — BACITRACIN ZINC 500 UNIT/GM EX OINT: 1.0000 "application " | TOPICAL_OINTMENT | Freq: Every day | CUTANEOUS | 0 refills | Status: AC

## 2019-11-11 NOTE — Patient Instructions (Addendum)
Adam Savage It was a pleasure meeting you today.  Please follow the instructions below for your wound care  For the open wound  1) collagen silver dressing changes daily 2) followed by non stick pad 3) wrap with gauze 4) cover with ace bandage   Over the sutures please place bacitracin ointment daily.  If it dries out you can do this twice a day.  You can keep the area covered with nonstick pad and wrap with gauze, or use a large bandaid  Please follow up with me in 2 weeks.  Call us at [phone] with any questions or concerns

## 2019-11-11 NOTE — Progress Notes (Signed)
   Subjective:     Patient ID: Adam Savage, male    DOB: 12/09/1950, 69 y.o.   MRN: [id number]  Chief Complaint  Patient presents with  . Consult for bilateral upper extremity wounds    HPI: The patient is a 69 y.o. male with history of insulin-dependent type 2 diabetes, dementia, chronic hep C that presents today for evaluation of upper extremity wounds.  Patient currently resides in an assisted living facility.  Patient states he had a walker next to his bed that he had fallen over.  He sustained lacerations to both arms and was evaluated in the emergency department after the event.  Sutures were placed to both left and right forearms.  He does have an open wound as well.  Currently the wounds have been dressed by the staff at his facility daily with a nonstick pad and gauze wrap.  He has mild pain.  He has moderate serous drainage.  He denies purulent drainage, increased warmth or erythema to the wounds.  Review of Systems  All other systems reviewed and are negative.    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  has a past surgical history that includes Cardiac surgery; Knee surgery (Left); Joint replacement; Colon surgery; CORONARY/GRAFT ANGIOGRAPHY (N/A, 10/31/2017); Cholecystectomy; Esophagogastroduodenoscopy (egd) with propofol (N/A, 01/18/2018); and tooth pulled (2021).  reports that he has quit smoking. His smoking use included cigarettes. He smoked 1.00 pack per day. He has never used smokeless tobacco. Objective:   Vital  Signs BP (!) 130/65 (BP Location: Left Arm, Patient Position: Sitting, Cuff Size: Large)   Pulse 65   Temp (!) 97.5 F (36.4 C) (Oral)   Ht 6' (1.829 m)   Wt (!) 306 lb (138.8 kg)   SpO2 96%   BMI 41.50 kg/m  Vital Signs and Nursing Note Reviewed Physical Exam Skin:    Comments: Right forearm wound measures:  11.0 x 2.5 x 0.2 cm Granulation tissue in the wound bed 3 areas sutured on the right forearm Left forearm with sutures in place         Assessment/Plan:     ICD-10-CM   1. Arm wound, right, initial encounter  S41.101A   2. Arm wound, left, initial encounter  S41.102A    Assessment: Lacerations bilaterally following a fall s/p sutures, with open wound to the right forearm  Patient was evaluated in the ED on 7/25 and had 41 sutures placed.  Today the sutures in place showed no signs of infection.  The open wound looks healthy also with no signs of infection.  At this time I recommended using bacitracin ointment over the sutures daily.  On the open wound he can use silver collagen daily.  Instructions were given to the patient and caregiver present from his assisted facility.   Plan -Wound cleansed and dressed with collagen to the open wound and bacitracin over the sutures.  This was covered with an ABD pad and wrapped with gauze -Silver collagen daily to open wound and bacitracin to sutures daily, cover with nonstick pad and Kerlix -Follow-up in 2 weeks   Boyd Kerbs, DO 11/11/2019, 2:23 PM

## 2019-11-11 NOTE — Progress Notes (Signed)
Internal Medicine Clinic Attending  Case discussed with Dr. Lee  At the time of the visit.  We reviewed the resident's history and pertinent patient test results.  I agree with the assessment, diagnosis, and plan of care documented in the resident's note.  

## 2019-11-12 DIAGNOSIS — I5032 Chronic diastolic (congestive) heart failure: Secondary | ICD-10-CM | POA: Diagnosis not present

## 2019-11-12 DIAGNOSIS — R2681 Unsteadiness on feet: Secondary | ICD-10-CM | POA: Diagnosis not present

## 2019-11-12 DIAGNOSIS — I69311 Memory deficit following cerebral infarction: Secondary | ICD-10-CM | POA: Diagnosis not present

## 2019-11-12 DIAGNOSIS — M6281 Muscle weakness (generalized): Secondary | ICD-10-CM | POA: Diagnosis not present

## 2019-11-12 DIAGNOSIS — I110 Hypertensive heart disease with heart failure: Secondary | ICD-10-CM | POA: Diagnosis not present

## 2019-11-12 DIAGNOSIS — I69398 Other sequelae of cerebral infarction: Secondary | ICD-10-CM | POA: Diagnosis not present

## 2019-11-16 DIAGNOSIS — I5032 Chronic diastolic (congestive) heart failure: Secondary | ICD-10-CM | POA: Diagnosis not present

## 2019-11-16 DIAGNOSIS — I69398 Other sequelae of cerebral infarction: Secondary | ICD-10-CM | POA: Diagnosis not present

## 2019-11-16 DIAGNOSIS — R2681 Unsteadiness on feet: Secondary | ICD-10-CM | POA: Diagnosis not present

## 2019-11-16 DIAGNOSIS — I110 Hypertensive heart disease with heart failure: Secondary | ICD-10-CM | POA: Diagnosis not present

## 2019-11-16 DIAGNOSIS — M6281 Muscle weakness (generalized): Secondary | ICD-10-CM | POA: Diagnosis not present

## 2019-11-16 DIAGNOSIS — I69311 Memory deficit following cerebral infarction: Secondary | ICD-10-CM | POA: Diagnosis not present

## 2019-11-17 ENCOUNTER — Ambulatory Visit: Payer: Medicare Other | Admitting: Physician Assistant

## 2019-11-18 ENCOUNTER — Other Ambulatory Visit: Payer: Medicare Other

## 2019-11-18 ENCOUNTER — Ambulatory Visit
Admission: RE | Admit: 2019-11-18 | Discharge: 2019-11-18 | Disposition: A | Discharge status: Home / Self Care | Payer: Medicare Other | Source: Ambulatory Visit | Attending: Internal Medicine | Admitting: Internal Medicine

## 2019-11-18 DIAGNOSIS — K7689 Other specified diseases of liver: Secondary | ICD-10-CM | POA: Diagnosis not present

## 2019-11-18 DIAGNOSIS — B182 Chronic viral hepatitis C: Secondary | ICD-10-CM

## 2019-11-18 DIAGNOSIS — K7460 Unspecified cirrhosis of liver: Secondary | ICD-10-CM

## 2019-11-22 ENCOUNTER — Ambulatory Visit (INDEPENDENT_AMBULATORY_CARE_PROVIDER_SITE_OTHER): Payer: Medicare Other | Admitting: *Deleted

## 2019-11-22 ENCOUNTER — Inpatient Hospital Stay (HOSPITAL_COMMUNITY)
Admission: EM | Admit: 2019-11-22 | Discharge: 2019-11-29 | DRG: 394 | Disposition: A | Discharge status: Home Health | Payer: Medicare Other | Source: Skilled Nursing Facility | Attending: Internal Medicine | Admitting: Internal Medicine

## 2019-11-22 ENCOUNTER — Emergency Department (HOSPITAL_COMMUNITY): Payer: Medicare Other

## 2019-11-22 ENCOUNTER — Telehealth: Payer: Self-pay | Admitting: *Deleted

## 2019-11-22 ENCOUNTER — Encounter (HOSPITAL_COMMUNITY): Payer: Self-pay | Admitting: Emergency Medicine

## 2019-11-22 DIAGNOSIS — Z8371 Family history of colonic polyps: Secondary | ICD-10-CM

## 2019-11-22 DIAGNOSIS — Z952 Presence of prosthetic heart valve: Secondary | ICD-10-CM | POA: Diagnosis not present

## 2019-11-22 DIAGNOSIS — Z79899 Other long term (current) drug therapy: Secondary | ICD-10-CM

## 2019-11-22 DIAGNOSIS — R072 Precordial pain: Secondary | ICD-10-CM

## 2019-11-22 DIAGNOSIS — F02818 Dementia in other diseases classified elsewhere, unspecified severity, with other behavioral disturbance: Secondary | ICD-10-CM | POA: Diagnosis present

## 2019-11-22 DIAGNOSIS — Z20822 Contact with and (suspected) exposure to covid-19: Secondary | ICD-10-CM | POA: Diagnosis not present

## 2019-11-22 DIAGNOSIS — I5030 Unspecified diastolic (congestive) heart failure: Secondary | ICD-10-CM | POA: Diagnosis present

## 2019-11-22 DIAGNOSIS — F0281 Dementia in other diseases classified elsewhere with behavioral disturbance: Secondary | ICD-10-CM | POA: Diagnosis present

## 2019-11-22 DIAGNOSIS — K5520 Angiodysplasia of colon without hemorrhage: Secondary | ICD-10-CM | POA: Diagnosis present

## 2019-11-22 DIAGNOSIS — I110 Hypertensive heart disease with heart failure: Secondary | ICD-10-CM | POA: Diagnosis not present

## 2019-11-22 DIAGNOSIS — K644 Residual hemorrhoidal skin tags: Secondary | ICD-10-CM | POA: Diagnosis present

## 2019-11-22 DIAGNOSIS — I517 Cardiomegaly: Secondary | ICD-10-CM | POA: Diagnosis not present

## 2019-11-22 DIAGNOSIS — Z8719 Personal history of other diseases of the digestive system: Secondary | ICD-10-CM

## 2019-11-22 DIAGNOSIS — I69398 Other sequelae of cerebral infarction: Secondary | ICD-10-CM | POA: Diagnosis not present

## 2019-11-22 DIAGNOSIS — R0789 Other chest pain: Secondary | ICD-10-CM | POA: Diagnosis not present

## 2019-11-22 DIAGNOSIS — J449 Chronic obstructive pulmonary disease, unspecified: Secondary | ICD-10-CM | POA: Diagnosis present

## 2019-11-22 DIAGNOSIS — D649 Anemia, unspecified: Secondary | ICD-10-CM | POA: Diagnosis not present

## 2019-11-22 DIAGNOSIS — M6281 Muscle weakness (generalized): Secondary | ICD-10-CM | POA: Diagnosis not present

## 2019-11-22 DIAGNOSIS — E871 Hypo-osmolality and hyponatremia: Secondary | ICD-10-CM | POA: Diagnosis not present

## 2019-11-22 DIAGNOSIS — Z6841 Body Mass Index (BMI) 40.0 and over, adult: Secondary | ICD-10-CM

## 2019-11-22 DIAGNOSIS — K5521 Angiodysplasia of colon with hemorrhage: Secondary | ICD-10-CM | POA: Diagnosis present

## 2019-11-22 DIAGNOSIS — Z5181 Encounter for therapeutic drug level monitoring: Secondary | ICD-10-CM | POA: Diagnosis not present

## 2019-11-22 DIAGNOSIS — Z902 Acquired absence of lung [part of]: Secondary | ICD-10-CM

## 2019-11-22 DIAGNOSIS — R079 Chest pain, unspecified: Secondary | ICD-10-CM | POA: Diagnosis not present

## 2019-11-22 DIAGNOSIS — D62 Acute posthemorrhagic anemia: Secondary | ICD-10-CM | POA: Diagnosis not present

## 2019-11-22 DIAGNOSIS — Z800 Family history of malignant neoplasm of digestive organs: Secondary | ICD-10-CM

## 2019-11-22 DIAGNOSIS — K7460 Unspecified cirrhosis of liver: Secondary | ICD-10-CM | POA: Diagnosis present

## 2019-11-22 DIAGNOSIS — J9811 Atelectasis: Secondary | ICD-10-CM | POA: Diagnosis not present

## 2019-11-22 DIAGNOSIS — I38 Endocarditis, valve unspecified: Secondary | ICD-10-CM | POA: Diagnosis present

## 2019-11-22 DIAGNOSIS — E876 Hypokalemia: Secondary | ICD-10-CM | POA: Diagnosis present

## 2019-11-22 DIAGNOSIS — K2100 Gastro-esophageal reflux disease with esophagitis, without bleeding: Secondary | ICD-10-CM | POA: Diagnosis present

## 2019-11-22 DIAGNOSIS — E7800 Pure hypercholesterolemia, unspecified: Secondary | ICD-10-CM

## 2019-11-22 DIAGNOSIS — F329 Major depressive disorder, single episode, unspecified: Secondary | ICD-10-CM | POA: Diagnosis present

## 2019-11-22 DIAGNOSIS — E119 Type 2 diabetes mellitus without complications: Secondary | ICD-10-CM | POA: Diagnosis present

## 2019-11-22 DIAGNOSIS — Z9181 History of falling: Secondary | ICD-10-CM

## 2019-11-22 DIAGNOSIS — B182 Chronic viral hepatitis C: Secondary | ICD-10-CM | POA: Diagnosis present

## 2019-11-22 DIAGNOSIS — K625 Hemorrhage of anus and rectum: Secondary | ICD-10-CM

## 2019-11-22 DIAGNOSIS — K6381 Dieulafoy lesion of intestine: Principal | ICD-10-CM | POA: Diagnosis present

## 2019-11-22 DIAGNOSIS — G40909 Epilepsy, unspecified, not intractable, without status epilepticus: Secondary | ICD-10-CM | POA: Diagnosis present

## 2019-11-22 DIAGNOSIS — K226 Gastro-esophageal laceration-hemorrhage syndrome: Principal | ICD-10-CM | POA: Diagnosis present

## 2019-11-22 DIAGNOSIS — S32010S Wedge compression fracture of first lumbar vertebra, sequela: Secondary | ICD-10-CM

## 2019-11-22 DIAGNOSIS — Z8249 Family history of ischemic heart disease and other diseases of the circulatory system: Secondary | ICD-10-CM

## 2019-11-22 DIAGNOSIS — Z9049 Acquired absence of other specified parts of digestive tract: Secondary | ICD-10-CM

## 2019-11-22 DIAGNOSIS — K635 Polyp of colon: Secondary | ICD-10-CM | POA: Diagnosis present

## 2019-11-22 DIAGNOSIS — I5032 Chronic diastolic (congestive) heart failure: Secondary | ICD-10-CM | POA: Diagnosis not present

## 2019-11-22 DIAGNOSIS — R296 Repeated falls: Secondary | ICD-10-CM | POA: Diagnosis present

## 2019-11-22 DIAGNOSIS — I69311 Memory deficit following cerebral infarction: Secondary | ICD-10-CM | POA: Diagnosis not present

## 2019-11-22 DIAGNOSIS — E785 Hyperlipidemia, unspecified: Secondary | ICD-10-CM | POA: Diagnosis present

## 2019-11-22 DIAGNOSIS — Z87891 Personal history of nicotine dependence: Secondary | ICD-10-CM

## 2019-11-22 DIAGNOSIS — M5489 Other dorsalgia: Secondary | ICD-10-CM | POA: Diagnosis not present

## 2019-11-22 DIAGNOSIS — K2270 Barrett's esophagus without dysplasia: Secondary | ICD-10-CM | POA: Diagnosis present

## 2019-11-22 DIAGNOSIS — Z825 Family history of asthma and other chronic lower respiratory diseases: Secondary | ICD-10-CM

## 2019-11-22 DIAGNOSIS — Z7901 Long term (current) use of anticoagulants: Secondary | ICD-10-CM

## 2019-11-22 DIAGNOSIS — R2681 Unsteadiness on feet: Secondary | ICD-10-CM | POA: Diagnosis not present

## 2019-11-22 DIAGNOSIS — Z794 Long term (current) use of insulin: Secondary | ICD-10-CM

## 2019-11-22 DIAGNOSIS — Z79891 Long term (current) use of opiate analgesic: Secondary | ICD-10-CM

## 2019-11-22 LAB — TROPONIN I (HIGH SENSITIVITY)
Troponin I (High Sensitivity): 7 ng/L (ref <18)
Troponin I (High Sensitivity): 7 ng/L (ref <18)

## 2019-11-22 LAB — COMPREHENSIVE METABOLIC PANEL
ALT: 19 U/L (ref 0–44)
AST: 28 U/L (ref 15–41)
Albumin: 3.2 g/dL — ABNORMAL LOW (ref 3.5–5.0)
Alkaline Phosphatase: 162 U/L — ABNORMAL HIGH (ref 38–126)
Anion gap: 8 (ref 5–15)
BUN: 22 mg/dL (ref 8–23)
CO2: 28 mmol/L (ref 22–32)
Calcium: 8.3 mg/dL — ABNORMAL LOW (ref 8.9–10.3)
Chloride: 96 mmol/L — ABNORMAL LOW (ref 98–111)
Creatinine, Ser: 1.56 mg/dL — ABNORMAL HIGH (ref 0.61–1.24)
GFR calc Af Amer: 52 mL/min — ABNORMAL LOW (ref >60)
GFR calc non Af Amer: 45 mL/min — ABNORMAL LOW (ref >60)
Glucose, Bld: 271 mg/dL — ABNORMAL HIGH (ref 70–99)
Potassium: 3.4 mmol/L — ABNORMAL LOW (ref 3.5–5.1)
Sodium: 132 mmol/L — ABNORMAL LOW (ref 135–145)
Total Bilirubin: 1 mg/dL (ref 0.3–1.2)
Total Protein: 7 g/dL (ref 6.5–8.1)

## 2019-11-22 LAB — CBC WITH DIFFERENTIAL/PLATELET
Abs Immature Granulocytes: 0.03 10*3/uL (ref 0.00–0.07)
Basophils Absolute: 0 10*3/uL (ref 0.0–0.1)
Basophils Relative: 1 %
Eosinophils Absolute: 0.1 10*3/uL (ref 0.0–0.5)
Eosinophils Relative: 1 %
HCT: 27.1 % — ABNORMAL LOW (ref 39.0–52.0)
Hemoglobin: 7.7 g/dL — ABNORMAL LOW (ref 13.0–17.0)
Immature Granulocytes: 1 %
Lymphocytes Relative: 15 %
Lymphs Abs: 0.9 10*3/uL (ref 0.7–4.0)
MCH: 20.9 pg — ABNORMAL LOW (ref 26.0–34.0)
MCHC: 28.4 g/dL — ABNORMAL LOW (ref 30.0–36.0)
MCV: 73.6 fL — ABNORMAL LOW (ref 80.0–100.0)
Monocytes Absolute: 0.6 10*3/uL (ref 0.1–1.0)
Monocytes Relative: 11 %
Neutro Abs: 4.1 10*3/uL (ref 1.7–7.7)
Neutrophils Relative %: 71 %
Platelets: 108 10*3/uL — ABNORMAL LOW (ref 150–400)
RBC: 3.68 MIL/uL — ABNORMAL LOW (ref 4.22–5.81)
RDW: 19.3 % — ABNORMAL HIGH (ref 11.5–15.5)
WBC: 5.7 10*3/uL (ref 4.0–10.5)
nRBC: 0 % (ref 0.0–0.2)

## 2019-11-22 LAB — RETICULOCYTES
Immature Retic Fract: 27.3 % — ABNORMAL HIGH (ref 2.3–15.9)
RBC.: 3.71 MIL/uL — ABNORMAL LOW (ref 4.22–5.81)
Retic Count, Absolute: 83.5 10*3/uL (ref 19.0–186.0)
Retic Ct Pct: 2.3 % (ref 0.4–3.1)

## 2019-11-22 LAB — IRON AND TIBC
Iron: 18 ug/dL — ABNORMAL LOW (ref 45–182)
Saturation Ratios: 4 % — ABNORMAL LOW (ref 17.9–39.5)
TIBC: 480 ug/dL — ABNORMAL HIGH (ref 250–450)
UIBC: 462 ug/dL

## 2019-11-22 LAB — VITAMIN B12: Vitamin B-12: 1088 pg/mL — ABNORMAL HIGH (ref 180–914)

## 2019-11-22 LAB — PROTIME-INR
INR: 2.5 — ABNORMAL HIGH (ref 0.8–1.2)
Prothrombin Time: 25.9 seconds — ABNORMAL HIGH (ref 11.4–15.2)

## 2019-11-22 LAB — POCT INR: INR: 2.1 (ref 2.0–3.0)

## 2019-11-22 LAB — BRAIN NATRIURETIC PEPTIDE: B Natriuretic Peptide: 118 pg/mL — ABNORMAL HIGH (ref 0.0–100.0)

## 2019-11-22 LAB — FOLATE: Folate: 17.8 ng/mL (ref >5.9)

## 2019-11-22 LAB — SARS CORONAVIRUS 2 BY RT PCR (HOSPITAL ORDER, PERFORMED IN ~~LOC~~ HOSPITAL LAB): SARS Coronavirus 2: NEGATIVE

## 2019-11-22 LAB — FERRITIN: Ferritin: 15 ng/mL — ABNORMAL LOW (ref 24–336)

## 2019-11-22 LAB — POC OCCULT BLOOD, ED: Fecal Occult Bld: NEGATIVE

## 2019-11-22 MED ORDER — ALBUTEROL SULFATE HFA 108 (90 BASE) MCG/ACT IN AERS: 2.0000 | INHALATION_SPRAY | RESPIRATORY_TRACT | Status: DC | PRN

## 2019-11-22 MED ORDER — ESCITALOPRAM OXALATE 10 MG PO TABS
10.0000 mg | ORAL_TABLET | Freq: Every day | ORAL | Status: DC
  Administered 2019-11-22 – 2019-11-28 (×7): 10 mg via ORAL
  Filled 2019-11-22 (×7): qty 1

## 2019-11-22 MED ORDER — INSULIN ASPART 100 UNIT/ML ~~LOC~~ SOLN
0.0000 [IU] | Freq: Three times a day (TID) | SUBCUTANEOUS | Status: DC
  Administered 2019-11-23 (×2): 3 [IU] via SUBCUTANEOUS
  Administered 2019-11-23: 1 [IU] via SUBCUTANEOUS
  Administered 2019-11-24 – 2019-11-26 (×6): 2 [IU] via SUBCUTANEOUS
  Administered 2019-11-26: 3 [IU] via SUBCUTANEOUS
  Administered 2019-11-27: 2 [IU] via SUBCUTANEOUS
  Administered 2019-11-27: 1 [IU] via SUBCUTANEOUS
  Administered 2019-11-27: 3 [IU] via SUBCUTANEOUS
  Administered 2019-11-28 – 2019-11-29 (×4): 2 [IU] via SUBCUTANEOUS
  Administered 2019-11-29: 1 [IU] via SUBCUTANEOUS
  Filled 2019-11-22 (×3): qty 1

## 2019-11-22 MED ORDER — SPIRONOLACTONE 25 MG PO TABS
50.0000 mg | ORAL_TABLET | Freq: Every day | ORAL | Status: DC
  Administered 2019-11-23 – 2019-11-29 (×5): 50 mg via ORAL
  Filled 2019-11-22 (×6): qty 2

## 2019-11-22 MED ORDER — QUETIAPINE FUMARATE 100 MG PO TABS
100.0000 mg | ORAL_TABLET | Freq: Every day | ORAL | Status: DC
  Administered 2019-11-22 – 2019-11-28 (×7): 100 mg via ORAL
  Filled 2019-11-22 (×7): qty 1

## 2019-11-22 MED ORDER — PANTOPRAZOLE SODIUM 40 MG PO TBEC
40.0000 mg | DELAYED_RELEASE_TABLET | Freq: Every day | ORAL | Status: DC
  Administered 2019-11-23 – 2019-11-29 (×5): 40 mg via ORAL
  Filled 2019-11-22 (×5): qty 1

## 2019-11-22 MED ORDER — LACTULOSE 10 GM/15ML PO SOLN
10.0000 g | Freq: Two times a day (BID) | ORAL | Status: DC
  Administered 2019-11-22 – 2019-11-29 (×11): 10 g via ORAL
  Filled 2019-11-22 (×12): qty 30

## 2019-11-22 MED ORDER — ALBUTEROL SULFATE (2.5 MG/3ML) 0.083% IN NEBU: 2.5000 mg | INHALATION_SOLUTION | RESPIRATORY_TRACT | Status: DC | PRN

## 2019-11-22 MED ORDER — CARVEDILOL 12.5 MG PO TABS
6.2500 mg | ORAL_TABLET | Freq: Two times a day (BID) | ORAL | Status: DC
  Administered 2019-11-23 (×2): 6.25 mg via ORAL
  Filled 2019-11-22 (×2): qty 1

## 2019-11-22 MED ORDER — QUETIAPINE FUMARATE 25 MG PO TABS
25.0000 mg | ORAL_TABLET | Freq: Every day | ORAL | Status: DC
  Administered 2019-11-23 – 2019-11-29 (×5): 25 mg via ORAL
  Filled 2019-11-22 (×5): qty 1

## 2019-11-22 MED ORDER — ATORVASTATIN CALCIUM 10 MG PO TABS
10.0000 mg | ORAL_TABLET | Freq: Every day | ORAL | Status: DC
  Administered 2019-11-23 – 2019-11-29 (×5): 10 mg via ORAL
  Filled 2019-11-22 (×5): qty 1

## 2019-11-22 MED ORDER — ONDANSETRON HCL 4 MG/2ML IJ SOLN: 4.0000 mg | Freq: Four times a day (QID) | INTRAMUSCULAR | Status: DC | PRN

## 2019-11-22 MED ORDER — MEMANTINE HCL 10 MG PO TABS
5.0000 mg | ORAL_TABLET | Freq: Two times a day (BID) | ORAL | Status: DC
  Administered 2019-11-23 – 2019-11-29 (×11): 5 mg via ORAL
  Filled 2019-11-22 (×13): qty 1

## 2019-11-22 MED ORDER — HYDROCODONE-ACETAMINOPHEN 5-325 MG PO TABS
1.0000 | ORAL_TABLET | Freq: Two times a day (BID) | ORAL | Status: DC | PRN
  Administered 2019-11-23 (×2): 1 via ORAL
  Filled 2019-11-22 (×2): qty 1

## 2019-11-22 MED ORDER — FUROSEMIDE 40 MG PO TABS
80.0000 mg | ORAL_TABLET | Freq: Two times a day (BID) | ORAL | Status: DC
  Administered 2019-11-23 (×2): 80 mg via ORAL
  Filled 2019-11-22 (×2): qty 2

## 2019-11-22 MED ORDER — POTASSIUM CHLORIDE CRYS ER 20 MEQ PO TBCR
20.0000 meq | EXTENDED_RELEASE_TABLET | Freq: Once | ORAL | Status: AC
  Administered 2019-11-23: 20 meq via ORAL
  Filled 2019-11-22: qty 1

## 2019-11-22 MED ORDER — LEVETIRACETAM 500 MG PO TABS
1000.0000 mg | ORAL_TABLET | Freq: Two times a day (BID) | ORAL | Status: DC
  Administered 2019-11-22 – 2019-11-29 (×12): 1000 mg via ORAL
  Filled 2019-11-22 (×12): qty 2

## 2019-11-22 MED ORDER — ACETAMINOPHEN 325 MG PO TABS
650.0000 mg | ORAL_TABLET | ORAL | Status: DC | PRN
  Administered 2019-11-26 – 2019-11-28 (×5): 650 mg via ORAL
  Filled 2019-11-22 (×5): qty 2

## 2019-11-22 NOTE — ED Notes (Addendum)
POC occult blood NEGATIVE

## 2019-11-22 NOTE — Patient Instructions (Signed)
Continue warfarin 1 tablet daily. Order given to  Kim Green RN [facility] Recheck INR in 2 weeks  Faxed order to [facility] [phone] 

## 2019-11-22 NOTE — ED Provider Notes (Signed)
[facility] EMERGENCY DEPARTMENT Provider Note   CSN: [id number] Arrival date & time: 11/22/19  1920     History Chief Complaint  Patient presents with  . Chest Pain    Adam Savage is a 69 y.o. male.  Patient from [facility] facility.  States this afternoon he had onset of substernal chest pain.  That is why he was sent in.  Patient has a history of dementia.  Past medical history seems to be significant for aortic valve replacement on Coumadin for that history of seizures on Keppra for that COPD diabetes status post lobectomy of the brain in 2015.  High cholesterol all of which would give him a significant heart score.  Is followed by the Coumadin clinic followed by cardiology.  Also internal medicine notes seem to claimant had diastolic heart failure.  Patient is on Seroquel.  Patient states that the substernal chest pain is been constant.  It is nonradiating.  Associated with a little bit of shortness of breath.  States he never had any pain like that before.  Patient appears pale.  Denies any blood in his bowel movements or any black color to his bowel movements.        Past Medical History:  Diagnosis Date  . COPD (chronic obstructive pulmonary disease) ([place])   . Dementia ([place])   . Depression   . Diabetes mellitus without complication ([place])   . GERD (gastroesophageal reflux disease)   . H/O diverticulitis of colon 11/03/2012   History of partial colectomy in 2011.  Marland Kitchen Hepatitis C   . High cholesterol   . Lower extremity edema   . Mechanical heart valve present   . Overdose of muscle relaxant 08/11/2017  . Pre-diabetes   . Retroperitoneal hematoma 01/29/2018  . S/P AVR (aortic valve replacement)   . Seizures ([place])   . 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 Chest pain 11/22/2019  . High cholesterol   . Accidental fall from bed 11/10/2019  . Microcytic anemia 06/17/2019  . Chronic, continuous use of  opioids 06/17/2019  . Hepatic cirrhosis due to chronic hepatitis C infection ([place]) 09/16/2018  . Compression fracture of L1 lumbar vertebra ([place]) 06/18/2018  . H/O: GI bleed 06/18/2018  . Dementia associated with other underlying disease with behavioral disturbance ([place])   . S/P AVR (aortic valve replacement)   . Chronic hepatitis C without hepatic coma ([place]) 01/12/2018  . 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 Chronic anticoagulation   . Seizure disorder ([place])   . COPD (chronic obstructive pulmonary disease) ([place])   . Type II diabetes mellitus ([place])   . Obesity 09/21/2015  . OSA (obstructive sleep apnea) 05/05/2015  . Tension headache, chronic 08/25/2013    Past Surgical History:  Procedure Laterality Date  . CARDIAC SURGERY    . CHOLECYSTECTOMY    . COLON SURGERY    . CORONARY/GRAFT ANGIOGRAPHY N/A 10/31/2017   Procedure: CORONARY/GRAFT ANGIOGRAPHY;  Surgeon: Nelva Bush, MD;  Location: [facility];  Service: Cardiovascular;  Laterality: N/A;  . ESOPHAGOGASTRODUODENOSCOPY (EGD) WITH PROPOFOL N/A 01/18/2018   Procedure: ESOPHAGOGASTRODUODENOSCOPY (EGD) WITH PROPOFOL;  Surgeon: Mauri Pole, MD;  Location: [facility];  Service: Endoscopy;  Laterality: N/A;  . JOINT REPLACEMENT    . KNEE SURGERY Left   . tooth pulled  2021       Family History  Problem Relation Age of Onset  .  Hypertension Father     Social History   Tobacco Use  . Smoking status: Former Smoker    Packs/day: 1.00    Types: Cigarettes  . Smokeless tobacco: Never Used  Vaping Use  . Vaping Use: Never used  Substance Use Topics  . Alcohol use: Not Currently  . Drug use: Never    Home Medications Prior to Admission medications   Medication Sig Start Date End Date Taking? Authorizing Provider  atorvastatin (LIPITOR) 10 MG tablet Take 1 tablet (10 mg total) by mouth daily. 08/03/19  Yes [name], MD  bacitracin ointment Apply 1 application topically daily.  11/11/19  Yes Hoffman, Jessica Ratliff, DO  carvedilol (COREG) 6.25 MG tablet Take 1 tablet (6.25 mg total) by mouth 2 (two) times daily with a meal. 10/20/19  Yes [name], MD  chlorhexidine (PERIDEX) 0.12 % solution Use as directed 15 mLs in the mouth or throat 2 (two) times daily.   Yes [provider]  escitalopram (LEXAPRO) 10 MG tablet Take 1 tablet (10 mg total) by mouth at bedtime. 11/10/19  Yes [name], MD  furosemide (LASIX) 80 MG tablet Take 80 mg by mouth 2 (two) times daily. 10/28/19  Yes [provider]  glucose blood test strip Test blood sugar 3 times daily before meals 06/22/19  Yes [name], MD  HYDROcodone-acetaminophen (NORCO/VICODIN) 5-325 MG tablet Take 1 tablet by mouth every 12 (twelve) hours as needed for moderate pain. 11/10/19  Yes [name], MD  insulin aspart (NOVOLOG FLEXPEN) 100 UNIT/ML FlexPen INJECT 10 UNITS INTO THE SKIN 3 (THREE) TIMES DAILY WITH MEALS. ADJUST DOSE AS NEEDED 03/19/19  Yes [name], MD  insulin detemir (LEVEMIR) 100 UNIT/ML injection Inject 0.28 mLs (28 Units total) into the skin at bedtime. Increase Levemir by 2 units when the week average is >200. 11/06/18  Yes [name], MD  Insulin Pen Needle (ADVOCATE INSULIN PEN NEEDLES) 31G X 5 MM MISC 1 Package by Does not apply route 4 (four) times daily. 02/03/19  Yes [name], MD  lactulose (CHRONULAC) 10 GM/15ML solution Take 15 mLs (10 g total) by mouth 2 (two) times daily. 06/15/19  Yes [name], MD  Lancets MISC 1 Stick by Does not apply route 3 (three) times daily with meals. 06/22/19  Yes [name], MD  levETIRAcetam (KEPPRA) 1000 MG tablet Take 1 tablet (1,000 mg total) by mouth 2 (two) times daily. 09/17/18  Yes Cameron Sprang, MD  pantoprazole (PROTONIX) 40 MG tablet Take 1 tablet (40 mg total) by mouth daily. 07/14/18  Yes [name], MD  QUEtiapine (SEROQUEL) 100 MG tablet TAKE 1 TABLETS IN THE EVENING 11/10/19  Yes [name], MD  QUEtiapine  (SEROQUEL) 25 MG tablet Take 1 tablet in AM 08/16/19  Yes Cameron Sprang, MD  spironolactone (ALDACTONE) 50 MG tablet Take 50 mg by mouth daily.   Yes [provider]  warfarin (COUMADIN) 4 MG tablet Take 1 tablet (4 mg total) by mouth daily. 07/14/18  Yes [name], MD  albuterol (VENTOLIN HFA) 108 (90 Base) MCG/ACT inhaler Inhale 2 puffs into the lungs every 4 (four) hours as needed for wheezing or shortness of breath. 11/06/18   [name], MD  memantine (NAMENDA) 5 MG tablet Take 5 mg by mouth 2 (two) times daily.    [provider]    Allergies    Oxycodone, Ativan [lorazepam], and Morphine and  related  Review of Systems   Review of Systems  Constitutional: Negative for chills and fever.  HENT: Negative for congestion, rhinorrhea and sore throat.   Eyes: Negative for visual disturbance.  Respiratory: Positive for shortness of breath. Negative for cough.   Cardiovascular: Positive for chest pain. Negative for leg swelling.  Gastrointestinal: Negative for abdominal pain, diarrhea, nausea and vomiting.  Genitourinary: Negative for dysuria.  Musculoskeletal: Negative for back pain and neck pain.  Skin: Positive for pallor. Negative for rash.  Neurological: Negative for dizziness, light-headedness and headaches.  Hematological: Does not bruise/bleed easily.  Psychiatric/Behavioral: Positive for confusion.    Physical Exam Updated Vital Signs BP 108/62   Pulse 74   Temp 97.7 F (36.5 C) (Oral)   Resp 18   Ht 1.829 m (6')   Wt (!) 137.9 kg   SpO2 99%   BMI 41.23 kg/m   Physical Exam Vitals and nursing note reviewed.  Constitutional:      General: He is not in acute distress.    Appearance: Normal appearance. He is well-developed.  HENT:     Head: Normocephalic and atraumatic.  Eyes:     Extraocular Movements: Extraocular movements intact.     Conjunctiva/sclera: Conjunctivae normal.     Pupils: Pupils are equal, round, and reactive to light.    Cardiovascular:     Rate and Rhythm: Normal rate and regular rhythm.     Heart sounds: No murmur heard.   Pulmonary:     Effort: Pulmonary effort is normal. No respiratory distress.     Breath sounds: Normal breath sounds.  Abdominal:     Palpations: Abdomen is soft.     Tenderness: There is no abdominal tenderness.  Genitourinary:    Rectum: Guaiac result negative.     Comments: Stool brown in color.  No external hemorrhoids.  Or prolapsed internal hemorrhoids. Musculoskeletal:        General: No swelling.     Cervical back: Normal range of motion and neck supple.     Comments: Skin scabs on top of the feet.  Cap refill is about 2 to 3 seconds.  Feet are warm.  No evidence of any ischemia.  No pain in the feet.  Right arm has a dressing and Ace wrap around it this is where he had big skin tear from a fall end of July.  Skin:    General: Skin is warm and dry.     Capillary Refill: Capillary refill takes 2 to 3 seconds.     Coloration: Skin is pale.  Neurological:     General: No focal deficit present.     Mental Status: He is alert and oriented to person, place, and time.     Cranial Nerves: No cranial nerve deficit.     Sensory: No sensory deficit.     Motor: No weakness.     ED Results / Procedures / Treatments   Labs (all labs ordered are listed, but only abnormal results are displayed) Labs Reviewed  COMPREHENSIVE METABOLIC PANEL - Abnormal; Notable for the following components:      Result Value   Sodium 132 (*)    Potassium 3.4 (*)    Chloride 96 (*)    Glucose, Bld 271 (*)    Creatinine, Ser 1.56 (*)    Calcium 8.3 (*)    Albumin 3.2 (*)    Alkaline Phosphatase 162 (*)    GFR calc non Af Amer 45 (*)    GFR calc  Af Amer 52 (*)    All other components within normal limits  CBC WITH DIFFERENTIAL/PLATELET - Abnormal; Notable for the following components:   RBC 3.68 (*)    Hemoglobin 7.7 (*)    HCT 27.1 (*)    MCV 73.6 (*)    MCH 20.9 (*)    MCHC 28.4 (*)     RDW 19.3 (*)    Platelets 108 (*)    All other components within normal limits  PROTIME-INR - Abnormal; Notable for the following components:   Prothrombin Time 25.9 (*)    INR 2.5 (*)    All other components within normal limits  SARS CORONAVIRUS 2 BY RT PCR (HOSPITAL ORDER, [facility])  VITAMIN B12  FOLATE  IRON AND TIBC  FERRITIN  RETICULOCYTES  BRAIN NATRIURETIC PEPTIDE  POC OCCULT BLOOD, ED  TYPE AND SCREEN  TROPONIN I (HIGH SENSITIVITY)  TROPONIN I (HIGH SENSITIVITY)    EKG EKG Interpretation  Date/Time:  Monday November 22 2019 19:31:26 EDT Ventricular Rate:  73 PR Interval:    QRS Duration: 97 QT Interval:  420 QTC Calculation: 463 R Axis:   -40 Text Interpretation: Sinus rhythm Supraventricular bigeminy Left axis deviation Anteroseptal infarct, age indeterminate Confirmed by Fredia Sorrow [phone]) on 11/22/2019 7:36:12 PM   Radiology DG Chest Port 1 View  Result Date: 11/22/2019 CLINICAL DATA:  Chest pain EXAM: PORTABLE CHEST 1 VIEW COMPARISON:  01/17/2018 FINDINGS: Prior median sternotomy and valve replacement. Cardiomegaly. No edema or effusions. Bibasilar opacities could reflect atelectasis or scarring. IMPRESSION: Cardiomegaly.  Bibasilar atelectasis or scarring. Electronically Signed   By: Rolm Baptise M.D.   On: 11/22/2019 20:45    Procedures Procedures (including critical care time)  Medications Ordered in ED Medications - No data to display  ED Course  I have reviewed the triage vital signs and the nursing notes.  Pertinent labs & imaging results that were available during my care of the patient were reviewed by me and considered in my medical decision making (see chart for details).    MDM Rules/Calculators/A&P                         Patient still with some chest discomfort substernal area.  Does have a history of dementia.  At ongoing pain since this afternoon seems to be improving we will going give patient aspirin.   Hemoglobin 7.7 down from where he was few weeks ago.  But just about a week ago it was 8.3.  Hemoccult negative from below so no GI blood loss.  Does have aortic valve and is on Coumadin.  Exact cause of the anemia not clear.  But could be causing some of the symptoms with chest pain.  Typed and screened but transfusion not initiated.  Dust with hospitalist.  They agree that they do not want to go ahead and transfusion.  But did agree based on his cardiac risk factors that they will admit him for rule out.  And patient would have a fairly significant heart score.  According to nursing notes patient has dementia is on Seroquel.  However does seem to answer questions fairly appropriate here.  Chest x-ray is negative EKG showed sort of super ventricular bigeminy.  Patient's Coumadin level is 2.5.  So is therapeutic.  Renal function baseline.  Patient in no acute distress here appears to be nontoxic.  Vital signs without any significant abnormalities.  Final Clinical Impression(s) / ED Diagnoses  Final diagnoses:  Precordial pain  Anemia, unspecified type    Rx / DC Orders ED Discharge Orders    None       Fredia Sorrow, MD 11/22/19 2203

## 2019-11-22 NOTE — ED Notes (Signed)
This nurse attempted IV access x's2

## 2019-11-22 NOTE — Telephone Encounter (Signed)
Spoke w/ dr Johnney Ou and he agrees will postpone appt until pt sees dr's comer and nandigam for eval.

## 2019-11-22 NOTE — Telephone Encounter (Signed)
Good Afternoon [name]!   If Mr. Butch is having any difficulties breathing (pain with deep breaths, shortness of breath), bleeding anywhere, or worsening pain, then he needs to go to the ED. Otherwise, let's try to see him in the clinic ASAP. He's on warfarin with increased falls, he may need more supervision than an assisted living considering he has had an increase in falls recently.   Please let me know if you have any questions!   Thank you!   Vasili Cressie Betzler

## 2019-11-22 NOTE — ED Triage Notes (Signed)
EMS called for Chest pain, pt complaining of lower back pain. Patient from [facility].

## 2019-11-22 NOTE — ED Notes (Signed)
Pt sitting up in bed

## 2019-11-22 NOTE — H&P (Signed)
History and Physical:    Adam Savage   HCW:[id number] DOB: March 13, 1951 DOA: 11/22/2019  Referring MD/provider: Dr. Roderic Palau PCP: [name], MD   Patient coming from: Home  Chief Complaint: Chest pain  History of Present Illness:   Adam Savage is Adam 69 y.o. 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  History is provided by patient as well as his daughter.  Patient states that "it hurts in my heart".  Patient states that he initially came in for back pain but then developed pain in the middle of his chest.  Notes he has had pain like this before but not recently.  Denies any exertional component to it.  Notes "it radiates all over".  No shortness of breath.  No DOE.  Patient's daughter states that he had previously been complaining about "pain in my rib" after he fell.  However this morning he complained of "pain in my heart".   Patient's daughter states he is never complained of heart pain" before although he has had rib fractures in the past and had complained about rib pain.   ED Course:  The patient was noted to be normotensive.  EKG was without any acute ST-T wave changes and initial troponin was 7.  However his hemoglobin was noted to be 7.7 down from 8.3 two weeks ago.  Of note however patient has had some plastic surgery and has had some falls in the past couple of weeks with some bleeding.  Guaiac was negative in ED.  ROS:   ROS   Review of Systems: Unable to provide accurate due to dementia  Past Medical History:   Past Medical History:  Diagnosis Date  . COPD (chronic obstructive pulmonary disease) ([place])   . Dementia ([place])   . Depression   . Diabetes mellitus without complication ([place])   . GERD (gastroesophageal reflux disease)   . H/O diverticulitis of colon 11/03/2012   History of partial colectomy in 2011.  Marland Kitchen Hepatitis C   . High  cholesterol   . Lower extremity edema   . Mechanical heart valve present   . Overdose of muscle relaxant 08/11/2017  . Pre-diabetes   . Retroperitoneal hematoma 01/29/2018  . S/P AVR (aortic valve replacement)   . Seizures ([place])   . Status post lobectomy of brain 06/28/2013    Past Surgical History:   Past Surgical History:  Procedure Laterality Date  . CARDIAC SURGERY    . CHOLECYSTECTOMY    . COLON SURGERY    . CORONARY/GRAFT ANGIOGRAPHY N/A 10/31/2017   Procedure: CORONARY/GRAFT ANGIOGRAPHY;  Surgeon: Nelva Bush, MD;  Location: [facility];  Service: Cardiovascular;  Laterality: N/A;  . ESOPHAGOGASTRODUODENOSCOPY (EGD) WITH PROPOFOL N/A 01/18/2018   Procedure: ESOPHAGOGASTRODUODENOSCOPY (EGD) WITH PROPOFOL;  Surgeon: Mauri Pole, MD;  Location: [facility];  Service: Endoscopy;  Laterality: N/A;  . JOINT REPLACEMENT    . KNEE SURGERY Left   . tooth pulled  2021    Social History:   Social History   Socioeconomic History  . Marital status: Married    Spouse name: Not on file  . Number of children: 3  . Years of education: Not on file  . Highest education level: 12th grade  Occupational History  . Occupation: retired  Tobacco Use  . Smoking status: Former Smoker    Packs/day: 1.00  Types: Cigarettes  . Smokeless tobacco: Never Used  Vaping Use  . Vaping Use: Never used  Substance and Sexual Activity  . Alcohol use: Not Currently  . Drug use: Never  . Sexual activity: Not Currently  Other Topics Concern  . Not on file  Social History Narrative   Right handed       [facility]   Social Determinants of Health   Financial Resource Strain:   . Difficulty of Paying Living Expenses:   Food Insecurity:   . Worried About Charity fundraiser in the Last Year:   . Arboriculturist in the Last Year:   Transportation Needs:   . Film/video editor (Medical):   Marland Kitchen Lack of Transportation (Non-Medical):   Physical Activity:   .  Days of Exercise per Week:   . Minutes of Exercise per Session:   Stress:   . Feeling of Stress :   Social Connections:   . Frequency of Communication with Friends and Family:   . Frequency of Social Gatherings with Friends and Family:   . Attends Religious Services:   . Active Member of Clubs or Organizations:   . Attends Archivist Meetings:   Marland Kitchen Marital Status:   Intimate Partner Violence:   . Fear of Current or Ex-Partner:   . Emotionally Abused:   Marland Kitchen Physically Abused:   . Sexually Abused:     Allergies   Oxycodone, Ativan [lorazepam], and Morphine and related  Family history:   Family History  Problem Relation Age of Onset  . Hypertension Father     Current Medications:   Prior to Admission medications   Medication Sig Start Date End Date Taking? Authorizing Provider  atorvastatin (LIPITOR) 10 MG tablet Take 1 tablet (10 mg total) by mouth daily. 08/03/19  Yes [name], MD  bacitracin ointment Apply 1 application topically daily. 11/11/19  Yes Hoffman, Jessica Ratliff, DO  carvedilol (COREG) 6.25 MG tablet Take 1 tablet (6.25 mg total) by mouth 2 (two) times daily with a meal. 10/20/19  Yes [name], MD  chlorhexidine (PERIDEX) 0.12 % solution Use as directed 15 mLs in the mouth or throat 2 (two) times daily.   Yes [provider]  escitalopram (LEXAPRO) 10 MG tablet Take 1 tablet (10 mg total) by mouth at bedtime. 11/10/19  Yes [name], MD  furosemide (LASIX) 80 MG tablet Take 80 mg by mouth 2 (two) times daily. 10/28/19  Yes [provider]  glucose blood test strip Test blood sugar 3 times daily before meals 06/22/19  Yes [name], MD  HYDROcodone-acetaminophen (NORCO/VICODIN) 5-325 MG tablet Take 1 tablet by mouth every 12 (twelve) hours as needed for moderate pain. 11/10/19  Yes [name], MD  insulin aspart (NOVOLOG FLEXPEN) 100 UNIT/ML FlexPen INJECT 10 UNITS INTO THE SKIN 3 (THREE) TIMES DAILY WITH MEALS. ADJUST DOSE AS  NEEDED 03/19/19  Yes [name], MD  insulin detemir (LEVEMIR) 100 UNIT/ML injection Inject 0.28 mLs (28 Units total) into the skin at bedtime. Increase Levemir by 2 units when the week average is >200. 11/06/18  Yes [name], MD  Insulin Pen Needle (ADVOCATE INSULIN PEN NEEDLES) 31G X 5 MM MISC 1 Package by Does not apply route 4 (four) times daily. 02/03/19  Yes [name], MD  lactulose (CHRONULAC) 10 GM/15ML solution Take 15 mLs (10 g total) by mouth 2 (two) times daily. 06/15/19  Yes Gilberto Better  K, MD  Lancets MISC 1 Stick by Does not apply route 3 (three) times daily with meals. 06/22/19  Yes [name], MD  levETIRAcetam (KEPPRA) 1000 MG tablet Take 1 tablet (1,000 mg total) by mouth 2 (two) times daily. 09/17/18  Yes Cameron Sprang, MD  pantoprazole (PROTONIX) 40 MG tablet Take 1 tablet (40 mg total) by mouth daily. 07/14/18  Yes [name], MD  QUEtiapine (SEROQUEL) 100 MG tablet TAKE 1 TABLETS IN THE EVENING 11/10/19  Yes [name], MD  QUEtiapine (SEROQUEL) 25 MG tablet Take 1 tablet in AM 08/16/19  Yes Cameron Sprang, MD  spironolactone (ALDACTONE) 50 MG tablet Take 50 mg by mouth daily.   Yes [provider]  warfarin (COUMADIN) 4 MG tablet Take 1 tablet (4 mg total) by mouth daily. 07/14/18  Yes [name], MD  albuterol (VENTOLIN HFA) 108 (90 Base) MCG/ACT inhaler Inhale 2 puffs into the lungs every 4 (four) hours as needed for wheezing or shortness of breath. 11/06/18   [name], MD  memantine (NAMENDA) 5 MG tablet Take 5 mg by mouth 2 (two) times daily.    [provider]    Physical Exam:   Vitals:   11/22/19 2042 11/22/19 2053 11/22/19 2114 11/22/19 2145  BP: 110/60   108/62  Pulse: 79 76 66 74  Resp: 19 17 19 18   Temp:      TempSrc:      SpO2: 100% 100% 98% 99%  Weight:      Height:         Physical Exam: Blood pressure 108/62, pulse 74, temperature 97.7 F (36.5 C), temperature source Oral, resp. rate 18, height 6' (1.829 m),  weight (!) 137.9 kg, SpO2 99 %. Gen: Somewhat pale, obese man lying flat in bed in no acute distress. Eyes: sclera anicteric, conjuctiva mildly injected bilaterally CVS: S1-S2, regulary, no gallops.  Patient has significant tenderness to palpation in the xiphoid process.  However patient states the pain in xiphoid process is different from the other pain that he has been having. Respiratory:  decreased air entry likely secondary to decreased inspiratory effort GI: Obese, NABS, firm but not hard, nontender to light or deep palpation. LE: Trace edema right lower extremity around ecchymoses. Skin: Ecchymoses on right lower extremity around the ankle.   Data Review:    Labs: Basic Metabolic Panel: Recent Labs  Lab 11/22/19 2013  NA 132*  K 3.4*  CL 96*  CO2 28  GLUCOSE 271*  BUN 22  CREATININE 1.56*  CALCIUM 8.3*   Liver Function Tests: Recent Labs  Lab 11/22/19 2013  AST 28  ALT 19  ALKPHOS 162*  BILITOT 1.0  PROT 7.0  ALBUMIN 3.2*   No results for input(s): LIPASE, AMYLASE in the last 168 hours. No results for input(s): AMMONIA in the last 168 hours. CBC: Recent Labs  Lab 11/22/19 2013  WBC 5.7  NEUTROABS 4.1  HGB 7.7*  HCT 27.1*  MCV 73.6*  PLT 108*   Cardiac Enzymes: No results for input(s): CKTOTAL, CKMB, CKMBINDEX, TROPONINI in the last 168 hours.  BNP (last 3 results) No results for input(s): PROBNP in the last 8760 hours. CBG: No results for input(s): GLUCAP in the last 168 hours.  Urinalysis    Component Value Date/Time   COLORURINE YELLOW 12/21/2017 2120   APPEARANCEUR HAZY (A) 12/21/2017 2120   LABSPEC 1.028 12/21/2017 2120   PHURINE 5.0 12/21/2017 2120   GLUCOSEU 150 (A)  12/21/2017 2120   HGBUR LARGE (A) 12/21/2017 2120   BILIRUBINUR NEGATIVE 12/21/2017 2120   [place] NEGATIVE 12/21/2017 2120   PROTEINUR 30 (A) 12/21/2017 2120   NITRITE NEGATIVE 12/21/2017 2120   LEUKOCYTESUR NEGATIVE 12/21/2017 2120      Radiographic Studies: DG  Chest Port 1 View  Result Date: 11/22/2019 CLINICAL DATA:  Chest pain EXAM: PORTABLE CHEST 1 VIEW COMPARISON:  01/17/2018 FINDINGS: Prior median sternotomy and valve replacement. Cardiomegaly. No edema or effusions. Bibasilar opacities could reflect atelectasis or scarring. IMPRESSION: Cardiomegaly.  Bibasilar atelectasis or scarring. Electronically Signed   By: Rolm Baptise M.D.   On: 11/22/2019 20:45    EKG: Independently reviewed.  Sinus rhythm versus wandering atrial pacemaker at 70.  Occasional PAC.  LAD.  Q waves V1 and V2.  No acute ST-T wave changes.   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 EKG is without any acute ST-T wave changes, initial troponin is 7. Will follow troponins overnight  Consult cardiology in the morning for inpatient versus outpatient stress test as warranted. Continue atorvastatin and carvedilol per home doses Aspirin not started as patient is already on Coumadin and no acute ST-T wave changes or elevated troponin.  Anemia Anticoagulated patient with progressive decrease in H&H of unclear etiology, possibly secondary to plastic surgery versus falls versus both.  Patient's BP is low normal. We will follow H&H overnight to ensure that it is stable. Type and screen has been sent.  Hyponatremia Possibly  secondary to spironolactone and furosemide setting of known cirrhosis  Hypokalemia We will give potassium 20 mEq p.o. x1 Recheck in the morning  DM2 SSI AC at bedtime with carb controlled diet Detemir being held  Status post AVR Continue Coumadin per pharmacy consultation  COPD No evidence for acute flare Continue as needed albuterol inhaler  Dementia/status post lobectomy of brain with some behavioral disturbance Continue Namenda, Seroquel and Lexapro  Seizure disorder Continue Keppra  GERD Continue pantoprazole  Cirrhosis secondary to hepatitis C Continue Lasix and spironolactone Continue lactulose    Other information:   DVT prophylaxis: Coumadin ordered. Code Status: Partial code, DO NOT INTUBATE Family Communication: Spoke with patient's daughter [name] and confirmed DNI status Disposition Plan: Turn to previous home situation Consults called: Cardiology for the morning Admission status: Observation  [name] Chatterjee [facility]  If 7PM-7AM, please contact night-coverage [email or web address] Password [email or web address] 11/22/2019, 9:55 PM

## 2019-11-22 NOTE — Telephone Encounter (Signed)
Daughter calls and states pt has had numerous falls in recent time, the worst being 7/25 but has had falls since also. She states pt keeps telling her daily that his "ribs and back hurt" and that he thinks he has done more damage to his back from falls. She is very concerned about this. Pt sees dr comer 8/10 and dr Silverio [name] 8/12. Will send this note to both and possibly they can look into this issue by ordering xray of back or rib area. I do see a recent US report of abdomen. If not then possibly an appt in [facility] for eval. Will send to dr's comer, nandigam and lee's team-yellow.  Pt's daughter would like a call after appts from MD's and given plan. She states he is complaining daily.

## 2019-11-23 ENCOUNTER — Other Ambulatory Visit: Payer: Self-pay

## 2019-11-23 ENCOUNTER — Other Ambulatory Visit (HOSPITAL_COMMUNITY): Payer: Medicare Other

## 2019-11-23 ENCOUNTER — Ambulatory Visit: Payer: Medicare Other | Admitting: Internal Medicine

## 2019-11-23 DIAGNOSIS — Z952 Presence of prosthetic heart valve: Secondary | ICD-10-CM | POA: Diagnosis not present

## 2019-11-23 DIAGNOSIS — E785 Hyperlipidemia, unspecified: Secondary | ICD-10-CM | POA: Diagnosis not present

## 2019-11-23 DIAGNOSIS — R072 Precordial pain: Secondary | ICD-10-CM | POA: Diagnosis not present

## 2019-11-23 DIAGNOSIS — D649 Anemia, unspecified: Secondary | ICD-10-CM | POA: Diagnosis not present

## 2019-11-23 DIAGNOSIS — I5030 Unspecified diastolic (congestive) heart failure: Secondary | ICD-10-CM

## 2019-11-23 DIAGNOSIS — J449 Chronic obstructive pulmonary disease, unspecified: Secondary | ICD-10-CM

## 2019-11-23 DIAGNOSIS — R079 Chest pain, unspecified: Secondary | ICD-10-CM | POA: Diagnosis not present

## 2019-11-23 DIAGNOSIS — B182 Chronic viral hepatitis C: Secondary | ICD-10-CM

## 2019-11-23 DIAGNOSIS — N183 Chronic kidney disease, stage 3 unspecified: Secondary | ICD-10-CM

## 2019-11-23 DIAGNOSIS — I10 Essential (primary) hypertension: Secondary | ICD-10-CM | POA: Diagnosis not present

## 2019-11-23 DIAGNOSIS — I38 Endocarditis, valve unspecified: Secondary | ICD-10-CM

## 2019-11-23 DIAGNOSIS — K7460 Unspecified cirrhosis of liver: Secondary | ICD-10-CM

## 2019-11-23 DIAGNOSIS — F0281 Dementia in other diseases classified elsewhere with behavioral disturbance: Secondary | ICD-10-CM

## 2019-11-23 LAB — BASIC METABOLIC PANEL
Anion gap: 11 (ref 5–15)
BUN: 22 mg/dL (ref 8–23)
CO2: 25 mmol/L (ref 22–32)
Calcium: 8.4 mg/dL — ABNORMAL LOW (ref 8.9–10.3)
Chloride: 100 mmol/L (ref 98–111)
Creatinine, Ser: 1.35 mg/dL — ABNORMAL HIGH (ref 0.61–1.24)
GFR calc Af Amer: >60 mL/min (ref >60)
GFR calc non Af Amer: 53 mL/min — ABNORMAL LOW (ref >60)
Glucose, Bld: 143 mg/dL — ABNORMAL HIGH (ref 70–99)
Potassium: 3.5 mmol/L (ref 3.5–5.1)
Sodium: 136 mmol/L (ref 135–145)

## 2019-11-23 LAB — HEMOGLOBIN AND HEMATOCRIT, BLOOD
HCT: 29.1 % — ABNORMAL LOW (ref 39.0–52.0)
HCT: 25.1 % — ABNORMAL LOW (ref 39.0–52.0)
Hemoglobin: 8.3 g/dL — ABNORMAL LOW (ref 13.0–17.0)
Hemoglobin: 7.2 g/dL — ABNORMAL LOW (ref 13.0–17.0)

## 2019-11-23 LAB — CBG MONITORING, ED
Glucose-Capillary: 136 mg/dL — ABNORMAL HIGH (ref 70–99)
Glucose-Capillary: 229 mg/dL — ABNORMAL HIGH (ref 70–99)
Glucose-Capillary: 232 mg/dL — ABNORMAL HIGH (ref 70–99)

## 2019-11-23 LAB — CBC
HCT: 28.4 % — ABNORMAL LOW (ref 39.0–52.0)
Hemoglobin: 8 g/dL — ABNORMAL LOW (ref 13.0–17.0)
MCH: 20.7 pg — ABNORMAL LOW (ref 26.0–34.0)
MCHC: 28.2 g/dL — ABNORMAL LOW (ref 30.0–36.0)
MCV: 73.4 fL — ABNORMAL LOW (ref 80.0–100.0)
Platelets: 107 10*3/uL — ABNORMAL LOW (ref 150–400)
RBC: 3.87 MIL/uL — ABNORMAL LOW (ref 4.22–5.81)
RDW: 19.3 % — ABNORMAL HIGH (ref 11.5–15.5)
WBC: 6.4 10*3/uL (ref 4.0–10.5)
nRBC: 0 % (ref 0.0–0.2)

## 2019-11-23 LAB — HEMOGLOBIN A1C
Hgb A1c MFr Bld: 7.7 % — ABNORMAL HIGH (ref 4.8–5.6)
Mean Plasma Glucose: 174.29 mg/dL

## 2019-11-23 LAB — LIPID PANEL
Cholesterol: 118 mg/dL (ref 0–200)
HDL: 44 mg/dL (ref >40)
LDL Cholesterol: 63 mg/dL (ref 0–99)
Total CHOL/HDL Ratio: 2.7 RATIO
Triglycerides: 55 mg/dL (ref <150)
VLDL: 11 mg/dL (ref 0–40)

## 2019-11-23 LAB — TROPONIN I (HIGH SENSITIVITY)
Troponin I (High Sensitivity): 7 ng/L (ref <18)
Troponin I (High Sensitivity): 7 ng/L (ref <18)

## 2019-11-23 LAB — HIV ANTIBODY (ROUTINE TESTING W REFLEX): HIV Screen 4th Generation wRfx: NONREACTIVE

## 2019-11-23 LAB — PROTIME-INR
INR: 2.7 — ABNORMAL HIGH (ref 0.8–1.2)
Prothrombin Time: 27.8 seconds — ABNORMAL HIGH (ref 11.4–15.2)

## 2019-11-23 LAB — POC OCCULT BLOOD, ED: Fecal Occult Bld: POSITIVE — AB

## 2019-11-23 LAB — GLUCOSE, CAPILLARY: Glucose-Capillary: 227 mg/dL — ABNORMAL HIGH (ref 70–99)

## 2019-11-23 MED ORDER — SODIUM CHLORIDE 0.9 % IV SOLN
250.0000 mg | INTRAVENOUS | Status: AC
  Administered 2019-11-23 – 2019-11-25 (×2): 250 mg via INTRAVENOUS
  Filled 2019-11-23 (×2): qty 20

## 2019-11-23 MED ORDER — HYDROCODONE-ACETAMINOPHEN 5-325 MG PO TABS
1.0000 | ORAL_TABLET | Freq: Three times a day (TID) | ORAL | Status: DC
  Administered 2019-11-23 – 2019-11-29 (×18): 1 via ORAL
  Filled 2019-11-23 (×18): qty 1

## 2019-11-23 MED ORDER — WARFARIN - PHARMACIST DOSING INPATIENT: Freq: Every day | Status: DC

## 2019-11-23 MED ORDER — SODIUM CHLORIDE 0.9 % IV SOLN: 510.0000 mg | Freq: Once | INTRAVENOUS | Status: DC

## 2019-11-23 MED ORDER — FERROUS SULFATE 325 (65 FE) MG PO TBEC
325.0000 mg | DELAYED_RELEASE_TABLET | Freq: Two times a day (BID) | ORAL | 3 refills | Status: DC
Start: 2019-11-23 — End: 2020-08-24

## 2019-11-23 MED ORDER — WARFARIN SODIUM 2 MG PO TABS
4.0000 mg | ORAL_TABLET | Freq: Every day | ORAL | Status: DC
  Filled 2019-11-23: qty 1

## 2019-11-23 NOTE — Progress Notes (Addendum)
Informed by staff that patient noted to have bowel movement with bright red blood and clots. Guaiac checked was positive.  Requested staff to discontinue discharge orders for today.  Patient is chronically anticoagulated with Coumadin for aortic valve.  INR is 2.7.  Requested staff to hold Coumadin for tonight.  Recheck H&H now.  If hemoglobin is trending down, may need transfusion.  Updated patient's daughter.  She reports that he is not had a colonoscopy in several years, but she has been told that he does have diverticuli.  His colonoscopy was performed in [place] several years before moving to [place].  He is currently hemodynamically stable.  Will request gastroenterology consultation.  We will not reverse INR with vitamin K unless patient continues to have bloody bowel movements/becomes hemodynamically unstable.  Will allow INR to drift down slowly.  He does not appear to be on any chronic NSAIDs. Will hold lasix and Coreg for tonight  [employer]

## 2019-11-23 NOTE — Plan of Care (Signed)
  Problem: Acute Rehab PT Goals(only PT should resolve) Goal: Patient Will Transfer Sit To/From Stand Outcome: Progressing Flowsheets (Taken 11/23/2019 1434) Patient will transfer sit to/from stand: with modified independence Goal: Pt Will Ambulate Outcome: Progressing Flowsheets (Taken 11/23/2019 1434) Pt will Ambulate:  > 125 feet  with supervision  with rolling walker Goal: Pt/caregiver will Perform Home Exercise Program Outcome: Progressing Flowsheets (Taken 11/23/2019 1434) Pt/caregiver will Perform Home Exercise Program:  For increased strengthening  For improved balance  Independently   Tori Rileyann Florance PT, DPT 11/23/19, 2:35 PM [phone]

## 2019-11-23 NOTE — Telephone Encounter (Signed)
Thank you [name], he will needs to be evaluated in the ER especially given numerous falls with complaints of back and rib pain.  Will also need to exclude concussion head injury.

## 2019-11-23 NOTE — ED Notes (Signed)
Pt c/o pain in his lower back, restless. Request pain meds for the same

## 2019-11-23 NOTE — Consult Note (Addendum)
Cardiology Consult    Patient ID: Adam Savage; [id number]; 09/27/1950   Admit date: 11/22/2019 Date of Consult: 11/23/2019  Primary Care Provider: [name], MD Primary Cardiologist: Larae Grooms, MD   Patient Profile    Adam Savage is a 69 y.o. male with past medical history of CAD (s/p catheterization in 10/2017 showing occlusion of distal LAD and likely embolic but otherwise mild nonobstructive CAD), HTN, HLD, OSA, Stage 3 CKD, mechanical AVR, COPD and history of PEA arrest (occurring in the setting of hemorrhagic shock in 01/2018) who is being seen today for the evaluation of chest pain at the request of Dr. [name].   History of Present Illness    Adam Savage was last examined by Dr. Irish Lack in 05/2018 and denied ant recent chest pain or dyspnea. Activity level was limited secondary to his back pain. No changes were made to his medication regimen at that time.   He presented to [facility] ED on 11/22/2019 from [facility] for evaluation of chest pain and back pain. In talking with the patient today, he reports having episodes of chest discomfort which he describes as an aching pain. Worse with lying down at night or taking a deep breath. Pain can last for a few hours. Feels better when he is walking around. Uses a walker for ambulation and he does report frequent falls over the past few weeks. Denies any dyspnea on exertion, orthopnea or PND. He does experience occasional lower extremity edema. He denies any recurrent pain this morning.   Initial labs show WBC 5.7, Hgb 7.7 (previously 10.6 three months prior), platelets 108, Na+ 132, K+ 3.4 and creatinine 1.56 (close to baseline). COVID negative. Occult blood negative. Initial and delta HS Troponin negative at 7 and 7. CXR shows cardiomegaly with bibasilar atelectasis. EKG showing NSR, HR 73 with PAC's and nonspecific ST abnormality along the anterior leads.    Past Medical History:  Diagnosis Date  . COPD  (chronic obstructive pulmonary disease) ([place])   . Dementia ([place])   . Depression   . Diabetes mellitus without complication ([place])   . GERD (gastroesophageal reflux disease)   . H/O diverticulitis of colon 11/03/2012   History of partial colectomy in 2011.  Marland Kitchen Hepatitis C   . High cholesterol   . Lower extremity edema   . Mechanical heart valve present   . Overdose of muscle relaxant 08/11/2017  . Pre-diabetes   . Retroperitoneal hematoma 01/29/2018  . S/P AVR (aortic valve replacement)   . Seizures ([place])   . 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 CARDIAC SURGERY    . CHOLECYSTECTOMY    . COLON SURGERY    . CORONARY/GRAFT ANGIOGRAPHY N/A 10/31/2017   Procedure: CORONARY/GRAFT ANGIOGRAPHY;  Surgeon: Nelva Bush, MD;  Location: [place] [facility];  Service: Cardiovascular;  Laterality: N/A;  . ESOPHAGOGASTRODUODENOSCOPY (EGD) WITH PROPOFOL N/A 01/18/2018   Procedure: ESOPHAGOGASTRODUODENOSCOPY (EGD) WITH PROPOFOL;  Surgeon: Mauri Pole, MD;  Location: [facility];  Service: Endoscopy;  Laterality: N/A;  . JOINT REPLACEMENT    . KNEE SURGERY Left   . tooth pulled  2021     Home Medications:  Prior to Admission medications   Medication Sig Start Date End Date Taking? Authorizing Provider  atorvastatin (LIPITOR) 10 MG tablet Take 1 tablet (10 mg total) by mouth daily. 08/03/19  Yes [name], MD  bacitracin ointment Apply 1 application topically daily.  11/11/19  Yes Hoffman, Jessica Ratliff, DO  carvedilol (COREG) 6.25 MG tablet Take 1 tablet (6.25 mg total) by mouth 2 (two) times daily with a meal. 10/20/19  Yes [name], MD  chlorhexidine (PERIDEX) 0.12 % solution Use as directed 15 mLs in the mouth or throat 2 (two) times daily.   Yes [provider]  escitalopram (LEXAPRO) 10 MG tablet Take 1 tablet (10 mg total) by mouth at bedtime. 11/10/19  Yes [name], MD  furosemide (LASIX) 80 MG tablet  Take 80 mg by mouth 2 (two) times daily. 10/28/19  Yes [provider]  glucose blood test strip Test blood sugar 3 times daily before meals 06/22/19  Yes [name], MD  HYDROcodone-acetaminophen (NORCO/VICODIN) 5-325 MG tablet Take 1 tablet by mouth every 12 (twelve) hours as needed for moderate pain. 11/10/19  Yes [name], MD  insulin aspart (NOVOLOG FLEXPEN) 100 UNIT/ML FlexPen INJECT 10 UNITS INTO THE SKIN 3 (THREE) TIMES DAILY WITH MEALS. ADJUST DOSE AS NEEDED 03/19/19  Yes [name], MD  insulin detemir (LEVEMIR) 100 UNIT/ML injection Inject 0.28 mLs (28 Units total) into the skin at bedtime. Increase Levemir by 2 units when the week average is >200. 11/06/18  Yes [name], MD  Insulin Pen Needle (ADVOCATE INSULIN PEN NEEDLES) 31G X 5 MM MISC 1 Package by Does not apply route 4 (four) times daily. 02/03/19  Yes [name], MD  lactulose (CHRONULAC) 10 GM/15ML solution Take 15 mLs (10 g total) by mouth 2 (two) times daily. 06/15/19  Yes [name], MD  Lancets MISC 1 Stick by Does not apply route 3 (three) times daily with meals. 06/22/19  Yes [name], MD  levETIRAcetam (KEPPRA) 1000 MG tablet Take 1 tablet (1,000 mg total) by mouth 2 (two) times daily. 09/17/18  Yes Cameron Sprang, MD  pantoprazole (PROTONIX) 40 MG tablet Take 1 tablet (40 mg total) by mouth daily. 07/14/18  Yes [name], MD  QUEtiapine (SEROQUEL) 100 MG tablet TAKE 1 TABLETS IN THE EVENING 11/10/19  Yes [name], MD  QUEtiapine (SEROQUEL) 25 MG tablet Take 1 tablet in AM 08/16/19  Yes Cameron Sprang, MD  spironolactone (ALDACTONE) 50 MG tablet Take 50 mg by mouth daily.   Yes [provider]  warfarin (COUMADIN) 4 MG tablet Take 1 tablet (4 mg total) by mouth daily. 07/14/18  Yes [name], MD  albuterol (VENTOLIN HFA) 108 (90 Base) MCG/ACT inhaler Inhale 2 puffs into the lungs every 4 (four) hours as needed for wheezing or shortness of breath. 11/06/18   [name], MD    memantine (NAMENDA) 5 MG tablet Take 5 mg by mouth 2 (two) times daily.    [provider]    Inpatient Medications: Scheduled Meds: . atorvastatin  10 mg Oral Daily  . carvedilol  6.25 mg Oral BID WC  . escitalopram  10 mg Oral QHS  . furosemide  80 mg Oral BID  . insulin aspart  0-9 Units Subcutaneous TID WC  . lactulose  10 g Oral BID  . levETIRAcetam  1,000 mg Oral BID  . memantine  5 mg Oral BID  . pantoprazole  40 mg Oral Daily  . QUEtiapine  100 mg Oral QHS  . QUEtiapine  25 mg Oral Daily  . spironolactone  50 mg Oral Daily  . warfarin  4 mg Oral q1600  . Warfarin - Pharmacist Dosing Inpatient  Does not apply q1600   Continuous Infusions:  PRN Meds: acetaminophen, albuterol, HYDROcodone-acetaminophen, ondansetron (ZOFRAN) IV  Allergies:    Allergies  Allergen Reactions  . Oxycodone Anxiety  . Ativan [Lorazepam]   . Morphine And Related Other (See Comments)    Combative     Social History:   Social History   Socioeconomic History  . Marital status: Married    Spouse name: Not on file  . Number of children: 3  . Years of education: Not on file  . Highest education level: 12th grade  Occupational History  . Occupation: retired  Tobacco Use  . Smoking status: Former Smoker    Packs/day: 1.00    Types: Cigarettes  . Smokeless tobacco: Never Used  Vaping Use  . Vaping Use: Never used  Substance and Sexual Activity  . Alcohol use: Not Currently  . Drug use: Never  . Sexual activity: Not Currently  Other Topics Concern  . Not on file  Social History Narrative   Right handed       [facility]   Social Determinants of Health   Financial Resource Strain:   . Difficulty of Paying Living Expenses:   Food Insecurity:   . Worried About Charity fundraiser in the Last Year:   . Arboriculturist in the Last Year:   Transportation Needs:   . Film/video editor (Medical):   Marland Kitchen Lack of Transportation (Non-Medical):   Physical  Activity:   . Days of Exercise per Week:   . Minutes of Exercise per Session:   Stress:   . Feeling of Stress :   Social Connections:   . Frequency of Communication with Friends and Family:   . Frequency of Social Gatherings with Friends and Family:   . Attends Religious Services:   . Active Member of Clubs or Organizations:   . Attends Archivist Meetings:   Marland Kitchen Marital Status:   Intimate Partner Violence:   . Fear of Current or Ex-Partner:   . Emotionally Abused:   Marland Kitchen Physically Abused:   . Sexually Abused:      Family History:    Family History  Problem Relation Age of Onset  . Hypertension Father       Review of Systems    General:  No chills, fever, night sweats or weight changes. Positive for back pain and frequent falls.  Cardiovascular:  No dyspnea on exertion, edema, orthopnea, palpitations, paroxysmal nocturnal dyspnea. Positive for chest pain. Dermatological: No rash, lesions/masses Respiratory: No cough, dyspnea Urologic: No hematuria, dysuria Abdominal:   No nausea, vomiting, diarrhea, bright red blood per rectum, melena, or hematemesis Neurologic:  No visual changes, wkns, changes in mental status. All other systems reviewed and are otherwise negative except as noted above.  Physical Exam/Data    Vitals:   11/22/19 2333 11/23/19 0129 11/23/19 0306 11/23/19 0730  BP: (!) 131/52  135/64   Pulse: 82 75 77 78  Resp: 15 17 18 19   Temp: 98.4 F (36.9 C)     TempSrc: Oral     SpO2: 98% 94% 95% 96%  Weight:      Height:        Intake/Output Summary (Last 24 hours) at 11/23/2019 0737 Last data filed at 11/22/2019 2333 Gross per 24 hour  Intake --  Output 400 ml  Net -400 ml   Filed Weights   11/22/19 1937  Weight: (!) 137.9 kg   Body mass index is 41.23 kg/m.  General: Pleasant Caucasian male appearing in NAD Psych: Normal affect. Neuro: Alert and oriented X 3. Moves all extremities spontaneously. HEENT: Normal  Neck: Supple without  bruits or JVD. Lungs:  Resp regular and unlabored, CTA without wheezing or rales. Heart: RRR with ectopic beats. No s3, s4, crisp mechanical valve sounds present.   Abdomen: Soft, non-tender, non-distended, BS + x 4.  Extremities: No clubbing or cyanosis. Trace lower extremity edema bilaterally. DP/PT/Radials 2+ and equal bilaterally.   EKG:  The EKG was personally reviewed and demonstrates: NSR, HR 73 with PAC's and nonspecific ST abnormality along the anterior leads.   Telemetry:  Telemetry was personally reviewed and demonstrates: NSR, HR in 60's to 70's with PAC's and occasional PVC's.    Labs/Studies     Relevant CV Studies:  Cardiac Catheterization: 10/2017 Conclusions: 1. Subtotal occlusion of the distal LAD, possibly thrombotic/embolic in origin.  Otherwise, mild nonobstructive coronary artery disease.      Recommendations: 1. Medical therapy, given distal location and vessel size of distal LAD subtotal occlusion.  This lesion is not amenable to PCI. 2. Restart heparin 2 hours after TR band removal. 3. Restart warfarin and continue aspirin 81 mg daily.  Recommend to resume warfarin per pharmacy dosing on 10/31/17.  Recommend concurrent antiplatelet therapy of Aspirin 81mg  daily for long-term therapy.     Echocardiogram: 10/2017 Study Conclusions   - Left ventricle: The cavity size was normal. There was severe  hypertrophy of the basal septum with otherwise moderate  concentric LVH. Systolic function was normal. The estimated  ejection fraction was in the range of 55% to 60%. Wall motion was  normal; there were no regional wall motion abnormalities. Doppler  parameters are consistent with abnormal left ventricular  relaxation (grade 1 diastolic dysfunction). Doppler parameters  are consistent with indeterminate ventricular filling pressure.  - Aortic valve: A bioprosthesis was present and functioning  properly. Valve area (VTI): 2.74 cm^2. Valve  area (Vmax): 2.72  cm^2. Valve area (Vmean): 2.7 cm^2.  - Mitral valve: Transvalvular velocity was within the normal range.  There was no evidence for stenosis. There was no regurgitation.  - Left atrium: The atrium was moderately dilated.  - Right ventricle: The cavity size was normal. Wall thickness was  normal. Systolic function was normal.  - Tricuspid valve: There was trivial regurgitation.  - Pulmonary arteries: Systolic pressure was within the normal  range. PA peak pressure: 27 mm Hg (S).   Laboratory Data:  Chemistry Recent Labs  Lab 11/22/19 2013  NA 132*  K 3.4*  CL 96*  CO2 28  GLUCOSE 271*  BUN 22  CREATININE 1.56*  CALCIUM 8.3*  GFRNONAA 45*  GFRAA 52*  ANIONGAP 8    Recent Labs  Lab 11/22/19 2013  PROT 7.0  ALBUMIN 3.2*  AST 28  ALT 19  ALKPHOS 162*  BILITOT 1.0   Hematology Recent Labs  Lab 11/22/19 2013  WBC 5.7  RBC 3.68*  3.71*  HGB 7.7*  HCT 27.1*  MCV 73.6*  MCH 20.9*  MCHC 28.4*  RDW 19.3*  PLT 108*   Cardiac EnzymesNo results for input(s): TROPONINI in the last 168 hours. No results for input(s): TROPIPOC in the last 168 hours.  BNP Recent Labs  Lab 11/22/19 2013  BNP 118.0*    DDimer No results for input(s): DDIMER in the last 168 hours.  Radiology/Studies:  DG Chest Port 1 View  Result Date: 11/22/2019 CLINICAL DATA:  Chest pain EXAM: PORTABLE CHEST 1 VIEW COMPARISON:  01/17/2018 FINDINGS: Prior median sternotomy and valve replacement. Cardiomegaly. No edema or effusions. Bibasilar opacities could reflect atelectasis or scarring. IMPRESSION: Cardiomegaly.  Bibasilar atelectasis or scarring. Electronically Signed   By: Rolm Baptise M.D.   On: 11/22/2019 20:45     Assessment & Plan    1. Chest Pain with Atypical Features - He describes episodes of chest pain which are worse with lying down and improves with activity. Pain can last for hours at a time. He has experienced multiple falls over the past few weeks and  his pain started after this. Overall, seems atypical for a cardiac etiology.  - Initial and delta HS Troponin negative at 7 and 7. EKG showing NSR, HR 73 with PAC's and nonspecific ST abnormality along the anterior leads. Some telemetry strips labeled as atrial fibrillation but p-waves are noted and seems most consistent with SR with PAC's.  - Will plan to obtain an echocardiogram to assess LV Function and wall motion. If no significant abnormalities identified, would not anticipate further cardiac testing this admission. Consider outpatient Lexiscan Myoview if recurrent symptoms.   2. CAD - He is s/p catheterization in 10/2017 showing occlusion of distal LAD and likely embolic but otherwise mild nonobstructive CAD as outlined above.  - Continue Coreg 6.25mg  BID and Atorvastatin 10mg  daily. Not on ASA given the need for anticoagulation.   3. AVR - He is s/p mechanical aortic valve in 2002. Wad functioning normally by echo in 2019. Will plan for a repeat echocardiogram as outlined above. INR 2.7 this AM. Appreciate pharmacy's assistance with dosing.   4. HTN - BP has been well-controlled at 91/62 - 135/64 since admission. He has been continued on PTA Coreg 6.25mg  BID and Spironolactone 50mg  daily.   5. HLD - No recent FLP on file. LDL previously 60 in 2019. Will add on FLP to AM labs. Continue Atorvastatin 10mg  daily.   6. Stage 3 CKD - Baseline creatinine 1.5 - 1.6. Stable at 1.56 this AM.   7. Anemia - Hgb previously 10.6 in 08/2019, 8.3 two weeks prior to admission. Now at 7.7. Occult blood negative. Further management per admitting team.     For questions or updates, please contact [place] Please consult [email or web address] for contact info under Cardiology/STEMI.  Signed, Erma Heritage, PA-C 11/23/2019, 7:37 AM Pager: [phone]   Patient examined chart reviewed Exam with normal mechanical valve clicks for S2 and no AR murmur SEM in systole through valve Pain is atypical  with no acute ECG changes  Fairly recent cath July 2019 with minimal luminal plaque outside of embolic distal LAD lesion Trend troponin negative so far In light of anemia not a candidate for cath. INR is RX Agree with checking echo for EF/RWMA;s and valve function   Jenkins Rouge MD [facility]

## 2019-11-23 NOTE — Care Management Obs Status (Signed)
[place] NOTIFICATION   Patient Details  Name: Adam Savage MRN: [id number] Date of Birth: 1950-06-29   Medicare Observation Status Notification Given:  Yes    Sherie Don, LCSW 11/23/2019, 9:51 AM

## 2019-11-23 NOTE — Therapy (Signed)
Physical Therapy Evaluation Patient Details Name: Adam Savage MRN: [id number] DOB: 28-Mar-1951 Today's Date: 11/23/2019   History of Present Illness  69 y.o. 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   Clinical Impression  Pt admitted with above diagnosis. Pt able to ambulate without AD, but demonstrates some gait abnormalities with increased lateral weight shifting and mild unsteadiness requiring cues to clear past obstacles at safe distance. No overt loss of balance or near falls while ambulating. Pt limited with ambulation due to LBP. Pt currently with functional limitations due to the deficits listed below (see PT Problem List). Pt will benefit from skilled PT to increase their independence and safety with mobility to allow discharge to the venue listed below.       Follow Up Recommendations Home health PT    Equipment Recommendations  None recommended by PT    Recommendations for Other Services       Precautions / Restrictions Precautions Precautions: Fall Restrictions Weight Bearing Restrictions: No      Mobility  Bed Mobility  General bed mobility comments: in bathroom upon arrival, transfers to chair at EOS  Transfers Overall transfer level: Needs assistance Equipment used: None Transfers: Sit to/from Stand;Stand Pivot Transfers Sit to Stand: Supervision Stand pivot transfers: Supervision  General transfer comment: BUE assist to power up from seated surface, no physical assistance  Ambulation/Gait Ambulation/Gait assistance: Min guard Gait Distance (Feet): 50 Feet Assistive device: None Gait Pattern/deviations: Step-through pattern;Decreased stride length;Wide base of support  General Gait Details: wide BOS, increased lateral weight-shifting, slightly unsteady with turns but no physical assistance or near falls, cues to clear past  obstacles at safe distance  Stairs            Wheelchair Mobility    Modified Rankin (Stroke Patients Only)       Balance Overall balance assessment: Needs assistance  Standing balance support: No upper extremity supported;During functional activity Standing balance-Leahy Scale: Fair Standing balance comment: no AD          Pertinent Vitals/Pain Pain Assessment: 0-10 Pain Score: 5  Pain Location: low back Pain Descriptors / Indicators: Aching;Sore Pain Intervention(s): Limited activity within patient's tolerance;Monitored during session;Repositioned;Patient requesting pain meds-RN notified    Home Living Family/patient expects to be discharged to:: Assisted living [facility])  Home Equipment: Gilford Rile - 4 wheels      Prior Function Level of Independence: Independent with assistive device(s)     Comments: Pt reports independent with community/household ambulation using rolator, independent with bathing and dressing, facility provides meals, laundry and cleaning. Pt endorses multiple falls but unable to remember when or cause     Hand Dominance        Extremity/Trunk Assessment   Upper Extremity Assessment Upper Extremity Assessment: Overall WFL for tasks assessed    Lower Extremity Assessment Lower Extremity Assessment: Overall WFL for tasks assessed (AROM WNL, strength 4/5, symmetrical, denies numbness/tinglin)    Cervical / Trunk Assessment Cervical / Trunk Assessment: Normal  Communication   Communication: No difficulties  Cognition Arousal/Alertness: Awake/alert Behavior During Therapy: WFL for tasks assessed/performed Overall Cognitive Status: History of cognitive impairments - at baseline  General Comments: Pt reports he has a hard time remembering things and that he has dementia      General Comments      Exercises     Assessment/Plan    PT Assessment Patient  needs continued PT services  PT Problem List Decreased strength;Decreased  activity tolerance;Decreased balance;Decreased cognition;Decreased knowledge of use of DME;Decreased safety awareness;Pain       PT Treatment Interventions DME instruction;Gait training;Functional mobility training;Therapeutic activities;Therapeutic exercise;Balance training;Patient/family education    PT Goals (Current goals can be found in the Care Plan section)  Acute Rehab PT Goals Patient Stated Goal: return home PT Goal Formulation: With patient Time For Goal Achievement: 11/30/19 Potential to Achieve Goals: Good    Frequency Min 2X/week   Barriers to discharge        Co-evaluation               AM-PAC PT "6 Clicks" Mobility  Outcome Measure Help needed turning from your back to your side while in a flat bed without using bedrails?: A Little Help needed moving from lying on your back to sitting on the side of a flat bed without using bedrails?: A Little Help needed moving to and from a bed to a chair (including a wheelchair)?: A Little Help needed standing up from a chair using your arms (e.g., wheelchair or bedside chair)?: A Little Help needed to walk in hospital room?: A Little Help needed climbing 3-5 steps with a railing? : A Little 6 Click Score: 18    End of Session   Activity Tolerance: Patient tolerated treatment well Patient left: in chair;with call bell/phone within reach Nurse Communication: Mobility status PT Visit Diagnosis: Unsteadiness on feet (R26.81);Other abnormalities of gait and mobility (R26.89);Repeated falls (R29.6)    Time: 9311-2162 PT Time Calculation (min) (ACUTE ONLY): 10 min   Charges:   PT Evaluation $PT Eval Low Complexity: 1 Low           Tori Annarose Ouellet PT, DPT 11/23/19, 2:29 PM [phone]

## 2019-11-23 NOTE — ED Notes (Signed)
Update given to [place] at [facility] at this time.

## 2019-11-23 NOTE — TOC Initial Note (Signed)
Transition of Care [facility]) - Initial/Assessment Note   Patient Details  Name: Adam Savage MRN: [id number] Date of Birth: 02/26/1951  Transition of Care [facility]) CM/SW Contact:    Sherie Don, LCSW Phone Number: 11/23/2019, 3:43 PM  Clinical Narrative: Patient is a 69 year old male who is under observation for chest pain. TOC received consult for HHPT. Per PT evaluation, HHPT is recommended. CSW called Judson Roch with [facility] to refer patient for [facility]. Hospitalist has placed orders. TOC signing off.  Expected Discharge Plan: [place] Barriers to Discharge: Continued Medical Work up  Patient Goals and CMS Choice Patient states their goals for this hospitalization and ongoing recovery are:: Discharge back to [facility] CMS Medicare.gov Compare Post Acute Care list provided to:: Patient Choice offered to / list presented to : Patient  Expected Discharge Plan and Services Expected Discharge Plan: [place] In-house Referral: Clinical Social Work Discharge Planning Services: NA Post Acute Care Choice: Home Health Living arrangements for the past 2 months: [place]            DME Arranged: N/A DME Agency: NA HH Arranged: PT HH Agency: [place] Date [place]: 11/23/19 Time HH Agency Contacted: [id number] Representative spoke with at [place]: Sarah  Prior Living Arrangements/Services Living arrangements for the past 2 months: [place] Lives with:: Facility Resident Patient language and need for interpreter reviewed:: Yes Do you feel safe going back to the place where you live?: Yes      Need for Family Participation in Patient Care: Yes (Comment) Care giver support system in place?: Yes (comment) Criminal Activity/Legal Involvement Pertinent to Current Situation/Hospitalization: No - Comment as needed  Permission Sought/Granted Permission sought to share information with : Facility Automotive engineer, Family Supports Permission granted to share information with : Yes, Verbal Permission Granted Share Information with NAME: [name] (daughter) Permission granted to share info w AGENCY: [facility]; [facility]  Emotional Assessment Appearance:: Appears stated age Attitude/Demeanor/Rapport: Complaining Affect (typically observed): Irritable Orientation: : Oriented to Self, Oriented to Place, Oriented to  Time, Oriented to Situation Alcohol / Substance Use: Not Applicable Psych Involvement: No (comment)  Admission diagnosis:  Chest pain [R07.9] Patient Active Problem List   Diagnosis Date Noted  . Chest pain 11/22/2019  . High cholesterol   . Accidental fall from bed 11/10/2019  . Microcytic anemia 06/17/2019  . Chronic, continuous use of opioids 06/17/2019  . Hepatic cirrhosis due to chronic hepatitis C infection ([place]) 09/16/2018  . Compression fracture of L1 lumbar vertebra ([place]) 06/18/2018  . H/O: GI bleed 06/18/2018  . Dementia associated with other underlying disease with behavioral disturbance ([place])   . S/P AVR (aortic valve replacement)   . Chronic hepatitis C without hepatic coma ([place]) 01/12/2018  . 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 Chronic anticoagulation   . Seizure disorder ([place])   . COPD (chronic obstructive pulmonary disease) ([place])   . Type II diabetes mellitus ([place])   . Obesity 09/21/2015  . OSA (obstructive sleep apnea) 05/05/2015  . Tension headache, chronic 08/25/2013   PCP:  [name], MD Pharmacy:   [place], [place] [address] Phone: [phone] Fax: [phone]  [id number] [place], [place] [address] Phone: [phone] Fax: [phone]  Readmission Risk Interventions No flowsheet data found.

## 2019-11-23 NOTE — Progress Notes (Signed)
ANTICOAGULATION CONSULT NOTE - Initial Consult  Pharmacy Consult for Warfarin Indication: Mechanical Valve  Allergies  Allergen Reactions  . Oxycodone Anxiety  . Ativan [Lorazepam]   . Morphine And Related Other (See Comments)    Combative     Patient Measurements: Height: 6' (182.9 cm) Weight: (!) 137.9 kg (304 lb) IBW/kg (Calculated) : 77.6 Heparin Dosing Weight:   Vital Signs: Temp: 98.4 F (36.9 C) (08/09 2333) Temp Source: Oral (08/09 2333) BP: 135/64 (08/10 0306) Pulse Rate: 77 (08/10 0306)  Labs: Recent Labs    11/22/19 0000 11/22/19 2013 11/22/19 2215  HGB  --  7.7*  --   HCT  --  27.1*  --   PLT  --  108*  --   LABPROT  --  25.9*  --   INR 2.1 2.5*  --   CREATININE  --  1.56*  --   TROPONINIHS  --  7 7    Estimated Creatinine Clearance: 64.3 mL/min (A) (by C-G formula based on SCr of 1.56 mg/dL (H)).   Medical History: Past Medical History:  Diagnosis Date  . COPD (chronic obstructive pulmonary disease) ([place])   . Dementia ([place])   . Depression   . Diabetes mellitus without complication ([place])   . GERD (gastroesophageal reflux disease)   . H/O diverticulitis of colon 11/03/2012   History of partial colectomy in 2011.  Marland Kitchen Hepatitis C   . High cholesterol   . Lower extremity edema   . Mechanical heart valve present   . Overdose of muscle relaxant 08/11/2017  . Pre-diabetes   . Retroperitoneal hematoma 01/29/2018  . S/P AVR (aortic valve replacement)   . Seizures ([place])   . 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 atorvastatin  10 mg Oral Daily  . carvedilol  6.25 mg Oral BID WC  . escitalopram  10 mg Oral QHS  . furosemide  80 mg Oral BID  . insulin aspart  0-9 Units Subcutaneous TID WC  . lactulose  10 g Oral BID  . levETIRAcetam  1,000 mg Oral BID  . memantine  5 mg Oral BID  . pantoprazole  40 mg Oral Daily  . QUEtiapine  100 mg Oral QHS  . QUEtiapine  25 mg Oral Daily  .  spironolactone  50 mg Oral Daily  . warfarin  4 mg Oral q1600  . Warfarin - Pharmacist Dosing Inpatient   Does not apply q1600    Assessment: Patient with chronic warfarin for mechanical valve.  INR 2.5 on admit and last dose noted on 8/9.    Goal of Therapy:  INR 2-3 Monitor platelets by anticoagulation protocol: Yes   Plan:  Continue home dosing of 4mg  po daily warfarin Daily INR  Tyler Deis, Shea Stakes [place] 11/23/2019,3:51 AM

## 2019-11-23 NOTE — Discharge Summary (Signed)
Physician Discharge Summary  Adam Savage RJJ:[id number] DOB: 1950/06/15 DOA: 11/22/2019  PCP: [name], MD  Admit date: 11/22/2019 Discharge date: 11/23/2019  Admitted From: Assisted living facility Disposition: Assisted living facility  Recommendations for Outpatient Follow-up:  1. Follow up with PCP in 1-2 weeks 2. Please obtain BMP/CBC in one week 3. Patient will follow up with cardiology for outpatient echocardiogram 4. He will need further work-up for iron deficiency anemia with colonoscopy as an outpatient.  Home Health: Home health PT Equipment/Devices:  Discharge Condition: Stable CODE STATUS: Partial code, no intubation Diet recommendation: Heart healthy  Brief/Interim Summary: Adam Savage is an 69 y.o. 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  History is provided by patient as well as his daughter.  Patient states that "it hurts in my heart".  Patient states that he initially came in for back pain but then developed pain in the middle of his chest.  Notes he has had pain like this before but not recently.  Denies any exertional component to it.  Notes "it radiates all over".  No shortness of breath.  No DOE.  Patient's daughter states that he had previously been complaining about "pain in my rib" after he fell.  However this morning he complained of "pain in my heart".   Patient's daughter states he is never complained of heart pain" before although he has had rib fractures in the past and had complained about rib pain.   ED Course:  The patient was noted to be normotensive.  EKG was without any acute ST-T wave changes and initial troponin was 7.  However his hemoglobin was noted to be 7.7 down from 8.3 two weeks ago.  Of note however patient has had some plastic surgery and has had some falls in the past couple of weeks with some bleeding.   Guaiac was negative in ED.   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 Chest pain.  Appears to be atypical.  Cardiac enzymes negative x2.  Seen by cardiology and it was felt appropriate to follow-up as outpatient with echocardiogram.  Patient is already anticoagulated making probability of VTE low.  Chest x-ray did not show any evidence of rib fractures.  Suspect musculoskeletal pain. 2. Iron deficiency anemia.  Stool guaiac checked in the emergency room found to be negative.  He did not report any gross evidence of bleeding.  Hemoglobin appears to be near baseline at approximately 8.  He received a dose of intravenous iron.  Is also been started on oral iron replacement therapy.  He should be considered for further work-up of iron deficiency anemia as an outpatient such as colonoscopy. 3. Hyponatremia.  Mild, asymptomatic, improved to 136 during admission. 4. Status post aortic valve replacement.  INR is therapeutic. 5. COPD.  No evidence of wheezing or shortness of breath at this time.  Continue bronchodilators as needed. 6. Dementia.  Continue Namenda, Seroquel and Lexapro. 7. Seizure disorder.  Continue on Keppra 8. GERD.  Continue on PPI 9. Cirrhosis secondary to hepatitis C.  Continued on Lasix and spironolactone.  He is also continued on lactulose 10. Generalized weakness with history of falls.  Seen by physical therapy and performed fairly well.  He was felt appropriate for home health physical therapy.  Discharge  Instructions  Discharge Instructions    Diet - low sodium heart healthy   Complete by: As directed    Increase activity slowly   Complete by: As directed      Allergies as of 11/23/2019      Reactions   Oxycodone Anxiety   Ativan [lorazepam]    Morphine And Related Other (See Comments)   Combative      Medication List    TAKE these medications   Advocate Insulin Pen Needles 31G X 5 MM Misc Generic drug: Insulin Pen Needle 1 Package by Does not apply route 4 (four) times daily.   albuterol 108 (90 Base) MCG/ACT inhaler Commonly known as: VENTOLIN HFA Inhale 2 puffs into the lungs every 4 (four) hours as needed for wheezing or shortness of breath.   atorvastatin 10 MG tablet Commonly known as: LIPITOR Take 1 tablet (10 mg total) by mouth daily.   bacitracin ointment Apply 1 application topically daily.   carvedilol 6.25 MG tablet Commonly known as: COREG Take 1 tablet (6.25 mg total) by mouth 2 (two) times daily with a meal.   chlorhexidine 0.12 % solution Commonly known as: PERIDEX Use as directed 15 mLs in the mouth or throat 2 (two) times daily.   escitalopram 10 MG tablet Commonly known as: LEXAPRO Take 1 tablet (10 mg total) by mouth at bedtime.   ferrous sulfate 325 (65 FE) MG EC tablet Take 1 tablet (325 mg total) by mouth 2 (two) times daily.   furosemide 80 MG tablet Commonly known as: LASIX Take 80 mg by mouth 2 (two) times daily.   glucose blood test strip Test blood sugar 3 times daily before meals   HYDROcodone-acetaminophen 5-325 MG tablet Commonly known as: NORCO/VICODIN Take 1 tablet by mouth every 12 (twelve) hours as needed for moderate pain.   insulin detemir 100 UNIT/ML injection Commonly known as: Levemir Inject 0.28 mLs (28 Units total) into the skin at bedtime. Increase Levemir by 2 units when the week average is >200.   lactulose 10 GM/15ML solution Commonly known as: CHRONULAC Take 15 mLs (10 g total) by mouth 2 (two) times  daily.   Lancets Misc 1 Stick by Does not apply route 3 (three) times daily with meals.   levETIRAcetam 1000 MG tablet Commonly known as: KEPPRA Take 1 tablet (1,000 mg total) by mouth 2 (two) times daily.   memantine 5 MG tablet Commonly known as: NAMENDA Take 5 mg by mouth 2 (two) times daily.   NovoLOG FlexPen 100 UNIT/ML FlexPen Generic drug: insulin aspart INJECT 10 UNITS INTO THE SKIN 3 (THREE) TIMES DAILY WITH MEALS. ADJUST DOSE AS NEEDED   pantoprazole 40 MG tablet Commonly known as: PROTONIX Take 1 tablet (40 mg total) by mouth daily.   QUEtiapine 25 MG tablet Commonly known as: SEROquel Take 1 tablet in AM   QUEtiapine 100 MG tablet Commonly known as: SEROQUEL TAKE 1 TABLETS IN THE EVENING   spironolactone 50 MG tablet Commonly known as: ALDACTONE Take 50 mg by mouth daily.   warfarin 4 MG tablet Commonly known as: COUMADIN Take as directed. If  you are unsure how to take this medication, talk to your nurse or doctor. Original instructions: Take 1 tablet (4 mg total) by mouth daily.       Allergies  Allergen Reactions  . Oxycodone Anxiety  . Ativan [Lorazepam]   . Morphine And Related Other (See Comments)    Combative     Consultations:  Cardiology   Procedures/Studies: DG Chest Port 1 View  Result Date: 11/22/2019 CLINICAL DATA:  Chest pain EXAM: PORTABLE CHEST 1 VIEW COMPARISON:  01/17/2018 FINDINGS: Prior median sternotomy and valve replacement. Cardiomegaly. No edema or effusions. Bibasilar opacities could reflect atelectasis or scarring. IMPRESSION: Cardiomegaly.  Bibasilar atelectasis or scarring. Electronically Signed   By: Rolm Baptise M.D.   On: 11/22/2019 20:45   US ABDOMEN LIMITED RUQ  Result Date: 11/18/2019 CLINICAL DATA:  Hepatic cirrhosis. EXAM: ULTRASOUND ABDOMEN LIMITED RIGHT UPPER QUADRANT COMPARISON:  March 29, 2019. FINDINGS: Gallbladder: Status post cholecystectomy. Common bile duct: Diameter: 5 mm which is within normal  limits. Liver: Heterogeneous echotexture is noted with slightly nodular contours suggesting hepatic cirrhosis. Stable 1 cm right hepatic cyst is noted. Portal vein is patent on color Doppler imaging with normal direction of blood flow towards the liver. Other: None. IMPRESSION: Status post cholecystectomy. Findings consistent with hepatic cirrhosis. Stable small right hepatic cyst. Electronically Signed   By: Marijo Conception M.D.   On: 11/18/2019 15:37       Subjective: Feeling better.  No shortness of breath or chest pain.  Discharge Exam: Vitals:   11/23/19 1130 11/23/19 1200 11/23/19 1300 11/23/19 1400  BP:  112/68 126/61 (!) 131/51  Pulse: 75 79 85 75  Resp: 20 17 16 16   Temp:      TempSrc:      SpO2: 99% 98% 100% 98%  Weight:      Height:        General: Pt is alert, awake, not in acute distress Cardiovascular: RRR, S1/S2 +, no rubs, no gallops Respiratory: CTA bilaterally, no wheezing, no rhonchi Abdominal: Soft, NT, ND, bowel sounds + Extremities: no edema, no cyanosis    The results of significant diagnostics from this hospitalization (including imaging, microbiology, ancillary and laboratory) are listed below for reference.     Microbiology: Recent Results (from the past 240 hour(s))  SARS Coronavirus 2 by RT PCR (hospital order, performed in [facility] hospital lab) Nasopharyngeal Nasopharyngeal Swab     Status: None   Collection Time: 11/22/19  9:50 PM   Specimen: Nasopharyngeal Swab  Result Value Ref Range Status   SARS Coronavirus 2 NEGATIVE NEGATIVE Final    Comment: (NOTE) SARS-CoV-2 target nucleic acids are NOT DETECTED.  The SARS-CoV-2 RNA is generally detectable in upper and lower respiratory specimens during the acute phase of infection. The lowest concentration of SARS-CoV-2 viral copies this assay can detect is 250 copies / mL. A negative result does not preclude SARS-CoV-2 infection and should not be used as the sole basis for treatment or  other patient management decisions.  A negative result may occur with improper specimen collection / handling, submission of specimen other than nasopharyngeal swab, presence of viral mutation(s) within the areas targeted by this assay, and inadequate number of viral copies (<250 copies / mL). A negative result must be combined with clinical observations, patient history, and epidemiological information.  Fact Sheet for Patients:   StrictlyIdeas.no  Fact Sheet for Healthcare Providers: BankingDealers.co.za  This test is not yet approved or  cleared by the [place] FDA  and has been authorized for detection and/or diagnosis of SARS-CoV-2 by FDA under an Emergency Use Authorization (EUA).  This EUA will remain in effect (meaning this test can be used) for the duration of the COVID-19 declaration under Section 564(b)(1) of the Act, 21 U.S.C. section 360bbb-3(b)(1), unless the authorization is terminated or revoked sooner.  Performed at [facility], [address], [place], [place] [address]      Labs: BNP (last 3 results) Recent Labs    11/22/19 2013  BNP 833.8*   Basic Metabolic Panel: Recent Labs  Lab 11/22/19 2013 11/23/19 0527  NA 132* 136  K 3.4* 3.5  CL 96* 100  CO2 28 25  GLUCOSE 271* 143*  BUN 22 22  CREATININE 1.56* 1.35*  CALCIUM 8.3* 8.4*   Liver Function Tests: Recent Labs  Lab 11/22/19 2013  AST 28  ALT 19  ALKPHOS 162*  BILITOT 1.0  PROT 7.0  ALBUMIN 3.2*   No results for input(s): LIPASE, AMYLASE in the last 168 hours. No results for input(s): AMMONIA in the last 168 hours. CBC: Recent Labs  Lab 11/22/19 2013 11/23/19 0527  WBC 5.7 6.4  NEUTROABS 4.1  --   HGB 7.7* 8.0*  HCT 27.1* 28.4*  MCV 73.6* 73.4*  PLT 108* 107*   Cardiac Enzymes: No results for input(s): CKTOTAL, CKMB, CKMBINDEX, TROPONINI in the last 168 hours. BNP: Invalid input(s): POCBNP CBG: Recent Labs  Lab  11/23/19 0856 11/23/19 1217  GLUCAP 136* 229*   D-Dimer No results for input(s): DDIMER in the last 72 hours. Hgb A1c Recent Labs    11/22/19 2215  HGBA1C 7.7*   Lipid Profile Recent Labs    11/23/19 0527  CHOL 118  HDL 44  LDLCALC 63  TRIG 55  CHOLHDL 2.7   Thyroid function studies No results for input(s): TSH, T4TOTAL, T3FREE, THYROIDAB in the last 72 hours.  Invalid input(s): FREET3 Anemia work up Recent Labs    11/22/19 2013  VITAMINB12 1,088*  FOLATE 17.8  FERRITIN 15*  TIBC 480*  IRON 18*  RETICCTPCT 2.3   Urinalysis    Component Value Date/Time   COLORURINE YELLOW 12/21/2017 2120   APPEARANCEUR HAZY (A) 12/21/2017 2120   LABSPEC 1.028 12/21/2017 2120   PHURINE 5.0 12/21/2017 2120   GLUCOSEU 150 (A) 12/21/2017 2120   HGBUR LARGE (A) 12/21/2017 2120   BILIRUBINUR NEGATIVE 12/21/2017 2120   KETONESUR NEGATIVE 12/21/2017 2120   PROTEINUR 30 (A) 12/21/2017 2120   NITRITE NEGATIVE 12/21/2017 2120   LEUKOCYTESUR NEGATIVE 12/21/2017 2120   Sepsis Labs Invalid input(s): PROCALCITONIN,  WBC,  LACTICIDVEN Microbiology Recent Results (from the past 240 hour(s))  SARS Coronavirus 2 by RT PCR (hospital order, performed in [place] hospital lab) Nasopharyngeal Nasopharyngeal Swab     Status: None   Collection Time: 11/22/19  9:50 PM   Specimen: Nasopharyngeal Swab  Result Value Ref Range Status   SARS Coronavirus 2 NEGATIVE NEGATIVE Final    Comment: (NOTE) SARS-CoV-2 target nucleic acids are NOT DETECTED.  The SARS-CoV-2 RNA is generally detectable in upper and lower respiratory specimens during the acute phase of infection. The lowest concentration of SARS-CoV-2 viral copies this assay can detect is 250 copies / mL. A negative result does not preclude SARS-CoV-2 infection and should not be used as the sole basis for treatment or other patient management decisions.  A negative result may occur with improper specimen collection / handling, submission  of specimen other than nasopharyngeal swab, presence of viral mutation(s)  within the areas targeted by this assay, and inadequate number of viral copies (<250 copies / mL). A negative result must be combined with clinical observations, patient history, and epidemiological information.  Fact Sheet for Patients:   StrictlyIdeas.no  Fact Sheet for Healthcare Providers: BankingDealers.co.za  This test is not yet approved or  cleared by the [place] FDA and has been authorized for detection and/or diagnosis of SARS-CoV-2 by FDA under an Emergency Use Authorization (EUA).  This EUA will remain in effect (meaning this test can be used) for the duration of the COVID-19 declaration under Section 564(b)(1) of the Act, 21 U.S.C. section 360bbb-3(b)(1), unless the authorization is terminated or revoked sooner.  Performed at [facility], [address] [place], [place] [address]      Time coordinating discharge: 72mins  SIGNED:   Kathie Dike, MD  [facility] 11/23/2019, 3:55 PM   If 7PM-7AM, please contact night-coverage [email or web address]

## 2019-11-23 NOTE — ED Notes (Signed)
Occult blood performed by Wells Guiles RN and Dr. Roderic Palau notified of grossly positive result.

## 2019-11-23 NOTE — ED Notes (Signed)
PT noted to have had medium bright red bowel movement with clots at this time with occult blood grossly positive. Hospitalist made aware and pt obtained new orders to continue being admitted. Director at [facility] made aware also.

## 2019-11-24 ENCOUNTER — Observation Stay (HOSPITAL_COMMUNITY): Payer: Medicare Other

## 2019-11-24 DIAGNOSIS — Z952 Presence of prosthetic heart valve: Secondary | ICD-10-CM | POA: Diagnosis not present

## 2019-11-24 DIAGNOSIS — F0281 Dementia in other diseases classified elsewhere with behavioral disturbance: Secondary | ICD-10-CM | POA: Diagnosis present

## 2019-11-24 DIAGNOSIS — Z794 Long term (current) use of insulin: Secondary | ICD-10-CM | POA: Diagnosis not present

## 2019-11-24 DIAGNOSIS — Z79899 Other long term (current) drug therapy: Secondary | ICD-10-CM | POA: Diagnosis not present

## 2019-11-24 DIAGNOSIS — Z79891 Long term (current) use of opiate analgesic: Secondary | ICD-10-CM | POA: Diagnosis not present

## 2019-11-24 DIAGNOSIS — F329 Major depressive disorder, single episode, unspecified: Secondary | ICD-10-CM | POA: Diagnosis present

## 2019-11-24 DIAGNOSIS — Z6841 Body Mass Index (BMI) 40.0 and over, adult: Secondary | ICD-10-CM | POA: Diagnosis not present

## 2019-11-24 DIAGNOSIS — E119 Type 2 diabetes mellitus without complications: Secondary | ICD-10-CM | POA: Diagnosis present

## 2019-11-24 DIAGNOSIS — D649 Anemia, unspecified: Secondary | ICD-10-CM | POA: Diagnosis not present

## 2019-11-24 DIAGNOSIS — Z8249 Family history of ischemic heart disease and other diseases of the circulatory system: Secondary | ICD-10-CM | POA: Diagnosis not present

## 2019-11-24 DIAGNOSIS — R072 Precordial pain: Secondary | ICD-10-CM | POA: Diagnosis present

## 2019-11-24 DIAGNOSIS — K5521 Angiodysplasia of colon with hemorrhage: Secondary | ICD-10-CM | POA: Diagnosis not present

## 2019-11-24 DIAGNOSIS — I110 Hypertensive heart disease with heart failure: Secondary | ICD-10-CM | POA: Diagnosis present

## 2019-11-24 DIAGNOSIS — K625 Hemorrhage of anus and rectum: Secondary | ICD-10-CM | POA: Diagnosis not present

## 2019-11-24 DIAGNOSIS — Z8719 Personal history of other diseases of the digestive system: Secondary | ICD-10-CM | POA: Diagnosis not present

## 2019-11-24 DIAGNOSIS — K6381 Dieulafoy lesion of intestine: Secondary | ICD-10-CM | POA: Diagnosis present

## 2019-11-24 DIAGNOSIS — K219 Gastro-esophageal reflux disease without esophagitis: Secondary | ICD-10-CM | POA: Diagnosis not present

## 2019-11-24 DIAGNOSIS — J9811 Atelectasis: Secondary | ICD-10-CM | POA: Diagnosis present

## 2019-11-24 DIAGNOSIS — Z7901 Long term (current) use of anticoagulants: Secondary | ICD-10-CM

## 2019-11-24 DIAGNOSIS — Z87891 Personal history of nicotine dependence: Secondary | ICD-10-CM | POA: Diagnosis not present

## 2019-11-24 DIAGNOSIS — R079 Chest pain, unspecified: Secondary | ICD-10-CM

## 2019-11-24 DIAGNOSIS — D124 Benign neoplasm of descending colon: Secondary | ICD-10-CM | POA: Diagnosis not present

## 2019-11-24 DIAGNOSIS — Z902 Acquired absence of lung [part of]: Secondary | ICD-10-CM | POA: Diagnosis not present

## 2019-11-24 DIAGNOSIS — Z20822 Contact with and (suspected) exposure to covid-19: Secondary | ICD-10-CM | POA: Diagnosis present

## 2019-11-24 DIAGNOSIS — E871 Hypo-osmolality and hyponatremia: Secondary | ICD-10-CM | POA: Diagnosis present

## 2019-11-24 DIAGNOSIS — Z980 Intestinal bypass and anastomosis status: Secondary | ICD-10-CM | POA: Diagnosis not present

## 2019-11-24 DIAGNOSIS — K7460 Unspecified cirrhosis of liver: Secondary | ICD-10-CM | POA: Diagnosis not present

## 2019-11-24 DIAGNOSIS — K644 Residual hemorrhoidal skin tags: Secondary | ICD-10-CM | POA: Diagnosis not present

## 2019-11-24 DIAGNOSIS — K649 Unspecified hemorrhoids: Secondary | ICD-10-CM | POA: Diagnosis not present

## 2019-11-24 DIAGNOSIS — I5030 Unspecified diastolic (congestive) heart failure: Secondary | ICD-10-CM | POA: Diagnosis present

## 2019-11-24 DIAGNOSIS — R296 Repeated falls: Secondary | ICD-10-CM | POA: Diagnosis present

## 2019-11-24 DIAGNOSIS — K5520 Angiodysplasia of colon without hemorrhage: Secondary | ICD-10-CM | POA: Diagnosis not present

## 2019-11-24 DIAGNOSIS — B182 Chronic viral hepatitis C: Secondary | ICD-10-CM | POA: Diagnosis not present

## 2019-11-24 DIAGNOSIS — J449 Chronic obstructive pulmonary disease, unspecified: Secondary | ICD-10-CM | POA: Diagnosis present

## 2019-11-24 DIAGNOSIS — E7800 Pure hypercholesterolemia, unspecified: Secondary | ICD-10-CM | POA: Diagnosis present

## 2019-11-24 DIAGNOSIS — D175 Benign lipomatous neoplasm of intra-abdominal organs: Secondary | ICD-10-CM | POA: Diagnosis not present

## 2019-11-24 DIAGNOSIS — D62 Acute posthemorrhagic anemia: Secondary | ICD-10-CM | POA: Diagnosis present

## 2019-11-24 DIAGNOSIS — I38 Endocarditis, valve unspecified: Secondary | ICD-10-CM | POA: Diagnosis not present

## 2019-11-24 DIAGNOSIS — Z9049 Acquired absence of other specified parts of digestive tract: Secondary | ICD-10-CM | POA: Diagnosis not present

## 2019-11-24 LAB — HEMOGLOBIN AND HEMATOCRIT, BLOOD
HCT: 27.3 % — ABNORMAL LOW (ref 39.0–52.0)
HCT: 27.1 % — ABNORMAL LOW (ref 39.0–52.0)
HCT: 28.3 % — ABNORMAL LOW (ref 39.0–52.0)
HCT: 26.9 % — ABNORMAL LOW (ref 39.0–52.0)
Hemoglobin: 7.7 g/dL — ABNORMAL LOW (ref 13.0–17.0)
Hemoglobin: 7.8 g/dL — ABNORMAL LOW (ref 13.0–17.0)
Hemoglobin: 8.1 g/dL — ABNORMAL LOW (ref 13.0–17.0)
Hemoglobin: 7.7 g/dL — ABNORMAL LOW (ref 13.0–17.0)

## 2019-11-24 LAB — ECHOCARDIOGRAM COMPLETE
AR max vel: 1.15 cm2
AV Area VTI: 1.22 cm2
AV Area mean vel: 1.04 cm2
AV Mean grad: 21.8 mmHg
AV Peak grad: 40 mmHg
Ao pk vel: 3.16 m/s
Area-P 1/2: 3.83 cm2
Height: 72 in
S' Lateral: 2.78 cm
Weight: 4825.43 oz

## 2019-11-24 LAB — GLUCOSE, CAPILLARY
Glucose-Capillary: 152 mg/dL — ABNORMAL HIGH (ref 70–99)
Glucose-Capillary: 159 mg/dL — ABNORMAL HIGH (ref 70–99)
Glucose-Capillary: 156 mg/dL — ABNORMAL HIGH (ref 70–99)

## 2019-11-24 LAB — PROTIME-INR
INR: 3.2 — ABNORMAL HIGH (ref 0.8–1.2)
Prothrombin Time: 31.8 seconds — ABNORMAL HIGH (ref 11.4–15.2)

## 2019-11-24 LAB — PREPARE RBC (CROSSMATCH)

## 2019-11-24 MED ORDER — PEG 3350-KCL-NA BICARB-NACL 420 G PO SOLR
4000.0000 mL | Freq: Once | ORAL | Status: AC
  Administered 2019-11-24: 4000 mL via ORAL

## 2019-11-24 MED ORDER — SODIUM CHLORIDE 0.9% IV SOLUTION: Freq: Once | INTRAVENOUS | Status: AC

## 2019-11-24 MED ORDER — SODIUM CHLORIDE 0.9% IV SOLUTION: Freq: Once | INTRAVENOUS | Status: DC

## 2019-11-24 NOTE — Progress Notes (Addendum)
ANTICOAGULATION CONSULT NOTE -   Pharmacy Consult for Warfarin Indication: Mechanical Valve  Allergies  Allergen Reactions  . Oxycodone Anxiety  . Ativan [Lorazepam]   . Morphine And Related Other (See Comments)    Combative     Patient Measurements: Height: 6' (182.9 cm) Weight: (!) 136.8 kg (301 lb 9.4 oz) IBW/kg (Calculated) : 77.6 Heparin Dosing Weight:   Vital Signs: Temp: 98 F (36.7 C) (08/11 0726) Temp Source: Oral (08/11 0711) BP: 104/65 (08/11 0726) Pulse Rate: 81 (08/11 0726)  Labs: Recent Labs    11/22/19 2013 11/22/19 2013 11/22/19 2215 11/23/19 0527 11/23/19 0527 11/23/19 1047 11/23/19 1737 11/23/19 1737 11/23/19 2314 11/24/19 0435  HGB 7.7*   < >  --  8.0*   < >  --  8.3*   < > 7.2* 7.7*  HCT 27.1*   < >  --  28.4*   < >  --  29.1*  --  25.1* 27.3*  PLT 108*  --   --  107*  --   --   --   --   --   --   LABPROT 25.9*  --   --  27.8*  --   --   --   --   --  31.8*  INR 2.5*  --   --  2.7*  --   --   --   --   --  3.2*  CREATININE 1.56*  --   --  1.35*  --   --   --   --   --   --   TROPONINIHS 7   < > 7 7  --  7  --   --   --   --    < > = values in this interval not displayed.    Estimated Creatinine Clearance: 74 mL/min (A) (by C-G formula based on SCr of 1.35 mg/dL (H)).   Medical History: Past Medical History:  Diagnosis Date  . COPD (chronic obstructive pulmonary disease) ([place])   . Dementia ([place])   . Depression   . Diabetes mellitus without complication ([place])   . GERD (gastroesophageal reflux disease)   . H/O diverticulitis of colon 11/03/2012   History of partial colectomy in 2011.  Marland Kitchen Hepatitis C   . High cholesterol   . Lower extremity edema   . Mechanical heart valve present   . Overdose of muscle relaxant 08/11/2017  . Pre-diabetes   . Retroperitoneal hematoma 01/29/2018  . S/P AVR (aortic valve replacement)   . Seizures ([place])   . 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 atorvastatin (LIPITOR) 10 MG tablet Take 1 tablet (10 mg total) by mouth daily. 90 tablet 3 11/22/2019 at 1700  . bacitracin ointment Apply 1 application topically daily. 120 g 0 11/22/2019 at 0700  . carvedilol (COREG) 6.25 MG tablet Take 1 tablet (6.25 mg total) by mouth 2 (two) times daily with a meal. 180 tablet 1 11/22/2019 at 1700  . chlorhexidine (PERIDEX) 0.12 % solution Use as directed 15 mLs in the mouth or throat 2 (two) times daily.   11/21/2019 at 2000  . escitalopram (LEXAPRO) 10 MG tablet Take 1 tablet (10 mg total) by mouth at bedtime. 90 tablet 3 11/21/2019 at 2100  . furosemide (LASIX) 80 MG tablet Take 80 mg by mouth 2 (two) times daily.   11/22/2019 at 1700  .  glucose blood test strip Test blood sugar 3 times daily before meals 100 each 12   . HYDROcodone-acetaminophen (NORCO/VICODIN) 5-325 MG tablet Take 1 tablet by mouth every 12 (twelve) hours as needed for moderate pain. 30 tablet 0 11/22/2019 at 1000  . insulin aspart (NOVOLOG FLEXPEN) 100 UNIT/ML FlexPen INJECT 10 UNITS INTO THE SKIN 3 (THREE) TIMES DAILY WITH MEALS. ADJUST DOSE AS NEEDED 45 mL 1 11/22/2019 at 1700  . insulin detemir (LEVEMIR) 100 UNIT/ML injection Inject 0.28 mLs (28 Units total) into the skin at bedtime. Increase Levemir by 2 units when the week average is >200. 10 mL 3 11/21/2019 at 2000  . Insulin Pen Needle (ADVOCATE INSULIN PEN NEEDLES) 31G X 5 MM MISC 1 Package by Does not apply route 4 (four) times daily. 100 each 2   . lactulose (CHRONULAC) 10 GM/15ML solution Take 15 mLs (10 g total) by mouth 2 (two) times daily. 473 mL 3 11/22/2019 at 1700  . Lancets MISC 1 Stick by Does not apply route 3 (three) times daily with meals. 200 each 3   . levETIRAcetam (KEPPRA) 1000 MG tablet Take 1 tablet (1,000 mg total) by mouth 2 (two) times daily. 180 tablet 3 11/22/2019 at 1700  . pantoprazole (PROTONIX) 40 MG tablet Take 1 tablet (40 mg total) by mouth daily. 90 tablet 3 11/22/2019 at 0800   . QUEtiapine (SEROQUEL) 100 MG tablet TAKE 1 TABLETS IN THE EVENING 135 tablet 3 11/21/2019 at 2100  . QUEtiapine (SEROQUEL) 25 MG tablet Take 1 tablet in AM 90 tablet 3 11/22/2019 at 0800  . spironolactone (ALDACTONE) 50 MG tablet Take 50 mg by mouth daily.   11/22/2019 at 0800  . warfarin (COUMADIN) 4 MG tablet Take 1 tablet (4 mg total) by mouth daily. 90 tablet 3 11/22/2019 at 1700  . albuterol (VENTOLIN HFA) 108 (90 Base) MCG/ACT inhaler Inhale 2 puffs into the lungs every 4 (four) hours as needed for wheezing or shortness of breath. 18 g 2 unknown PRN at unknown  . memantine (NAMENDA) 5 MG tablet Take 5 mg by mouth 2 (two) times daily.      Scheduled:  . sodium chloride   Intravenous Once  . atorvastatin  10 mg Oral Daily  . escitalopram  10 mg Oral QHS  . HYDROcodone-acetaminophen  1 tablet Oral Q8H  . insulin aspart  0-9 Units Subcutaneous TID WC  . lactulose  10 g Oral BID  . levETIRAcetam  1,000 mg Oral BID  . memantine  5 mg Oral BID  . pantoprazole  40 mg Oral Daily  . QUEtiapine  100 mg Oral QHS  . QUEtiapine  25 mg Oral Daily  . spironolactone  50 mg Oral Daily  . warfarin  4 mg Oral q1600  . Warfarin - Pharmacist Dosing Inpatient   Does not apply q1600    Assessment: Patient with chronic warfarin for mechanical valve.  hgb 7.7  Platelets 107  8/10 dose not given due to new GI bleeding  INR 3.2   Goal of Therapy:  INR 2-3 Monitor platelets by anticoagulation protocol: Yes   Plan:  Hold warfarin x 1 dose. Daily INR  Margot Ables, PharmD Clinical Pharmacist 11/24/2019 9:05 AM

## 2019-11-24 NOTE — Progress Notes (Signed)
*  PRELIMINARY RESULTS* Echocardiogram 2D Echocardiogram has been performed.  Leavy Cella 11/24/2019, 10:36 AM

## 2019-11-24 NOTE — Progress Notes (Signed)
Dr Kyla Balzarine rounding note reviewed, atypical chest pain without plans for further workup beyond echo which we will follow up on. Being managed for hematochezia by primary team. Mechanical AVR on coumadin, INR 3.2 today. There is some question about a prior embolic MI a few years ago, though not definitive. If neccesary can hold coumadin, if no active bleeding would bridge with heparin when INR <2. Will follow patient peripherally, f/u echo   Carlyle Dolly MD

## 2019-11-24 NOTE — Progress Notes (Signed)
Per Dr. Roderic Palau do not give second unit of PRBC at this time. Pt received 1 unit and tolerated well.

## 2019-11-24 NOTE — Progress Notes (Signed)
PROGRESS NOTE    Ward Boissonneault  TOI:[id number] DOB: [birth year] DOA: 11/22/2019 PCP: [name], MD    Brief Narrative:  Adam Savage an 69 y.o.malewith PMH significant for DM 2, HL, AVR on Coumadin, dementia status post lobectomy of brain, multiple recent falls, history of GI bleed who now presents with chest pain. History is provided by patient as well as his daughter.  Patient states that "it hurts in my heart". Patient states that he initially came in for back pain but then developed pain in the middle of his chest. Notes he has had pain like this before but not recently. Denies any exertional component to it. Notes "it radiates all over". No shortness of breath. No DOE. Patient's daughter states that he had previously been complaining about "pain in my rib" after he fell. However this morning he complained of "pain in my heart".   Patient's daughter states he is never complained of heart pain" before although he has had rib fractures in the past and had complained about rib pain.  ED Course:The patient was noted to be normotensive. EKG was without any acute ST-T wave changes and initial troponin was 7. However his hemoglobin was noted to be 7.7 down from 8.3 twoweeks ago. Of note however patient has had some plastic surgery and has had some falls in the past couple of weeks with some bleeding. Guaiac was negative in ED.   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)   High cholesterol   1. Chest pain.  Appears to be atypical.   Cardiac enzymes negative x2.  Seen by cardiology.  Echocardiogram done with report pending. Patient is chronically anticoagulated making probability of VTE low.  Chest x-ray did not show any evidence of rib fractures.  Suspect musculoskeletal pain. 2. Acute blood loss anemia, secondary to GI bleeding.  Patient found to have significant iron deficiency.  He was started on intravenous iron infusion.  While in the hospital, he developed bloody stools.  Seen by GI with plans for colonoscopy in a.m.  Continue to follow hemoglobin.  He has been transfused 1 unit of PRBC thus far. 3. Hyponatremia.  Mild, asymptomatic, improved to 136 during admission. 4. Status post aortic valve replacement.  INR is therapeutic.  He is receiving FFP to help reverse INR for colonoscopy in a.m.  INR be repeated in a.m. and further FFP can be administered as needed.  Goal INR will be at least less than 2. 5. COPD.  No evidence of wheezing or shortness of breath at this time.  Continue bronchodilators as needed. 6. Dementia.  Continue Namenda, Seroquel and Lexapro. 7. Seizure disorder.  Continue on Keppra 8. GERD.  Continue on PPI 9. Cirrhosis secondary to hepatitis C.  Continued on Lasix and spironolactone.  He is also continued on lactulose 10. Generalized weakness with history of falls.  Seen by physical therapy and performed fairly well.  He was felt appropriate for home health physical therapy.   DVT prophylaxis: coumadin  Code Status: Partial code, no intubation Family Communication: discussed  with daughter at the bedside Disposition Plan: Status is: Inpatient  Remains inpatient appropriate because:Ongoing diagnostic testing needed not appropriate for outpatient work up   Dispo: The patient is from: ALF              Anticipated d/c is to: ALF              Anticipated d/c date is: 2 days              Patient currently is not medically stable to d/c.     Consultants:   Cardiology  Gastroenterology   Procedures:   Echocardiogram  Antimicrobials:       Subjective: Continues to have some blood in stools.  Denies any chest pain or shortness of breath.  Objective: Vitals:   11/24/19 0933 11/24/19 1517 11/24/19 1732 11/24/19 1817  BP: (!) 103/46 94/81 (!) 144/56 117/72  Pulse: 66 81 79 78  Resp: 19 18 20 18   Temp: 98.3 F (36.8 C) 98.1 F (36.7 C) 98.5 F (36.9 C) 98.3 F (36.8 C)  TempSrc: Oral     SpO2: 100% 100% 100% 100%  Weight:      Height:        Intake/Output Summary (Last 24 hours) at 11/24/2019 1900 Last data filed at 11/24/2019 0933 Gross per 24 hour  Intake 284 ml  Output 150 ml  Net 134 ml   Filed Weights   11/22/19 1937 11/23/19 2128  Weight: (!) 137.9 kg (!) 136.8 kg    Examination:  General exam: Appears calm and comfortable  Respiratory system: Clear to auscultation. Respiratory effort normal. Cardiovascular system: S1 & S2 heard, RRR. No JVD, murmurs, rubs, gallops or clicks. No pedal edema. Gastrointestinal system: Abdomen is nondistended, soft and nontender. No organomegaly or masses felt. Normal bowel sounds heard. Central nervous system: Alert and oriented. No focal neurological deficits. Extremities: Symmetric 5 x 5 power. Skin: No rashes, lesions or ulcers Psychiatry: Judgement and insight appear normal. Mood & affect appropriate.     Data Reviewed: I have personally reviewed following labs and imaging studies  CBC: Recent Labs  Lab 11/22/19 2013 11/22/19 2013 11/23/19 0527 11/23/19 0527 11/23/19 1737 11/23/19 2314 11/24/19 0435 11/24/19 1112 11/24/19 1429  WBC 5.7  --  6.4  --   --   --   --   --   --   NEUTROABS 4.1  --   --   --   --   --   --   --   --   HGB 7.7*   < > 8.0*   < > 8.3* 7.2* 7.7* 7.8* 8.1*  HCT 27.1*   < > 28.4*   < > 29.1* 25.1* 27.3* 27.1* 28.3*  MCV 73.6*  --  73.4*  --   --   --   --   --   --   PLT 108*  --  107*  --   --   --   --   --   --    < > = values in this interval not displayed.   Basic  Metabolic Panel: Recent Labs  Lab 11/22/19 2013 11/23/19 0527  NA 132* 136  K 3.4* 3.5  CL 96* 100  CO2 28 25  GLUCOSE 271* 143*  BUN 22 22  CREATININE 1.56* 1.35*  CALCIUM 8.3* 8.4*   GFR: Estimated Creatinine Clearance: 74 mL/min (A) (by C-G formula based on SCr of 1.35 mg/dL (H)). Liver Function Tests: Recent Labs  Lab 11/22/19 2013  AST 28  ALT 19  ALKPHOS 162*  BILITOT 1.0  PROT 7.0  ALBUMIN 3.2*   No results for input(s): LIPASE, AMYLASE in the last 168 hours. No results for input(s): AMMONIA in the last 168 hours. Coagulation Profile: Recent Labs  Lab 11/22/19 0000 11/22/19 2013 11/23/19 0527 11/24/19 0435  INR 2.1 2.5* 2.7* 3.2*   Cardiac Enzymes: No results for input(s): CKTOTAL, CKMB, CKMBINDEX, TROPONINI in the last 168 hours. BNP (last 3 results) No results for input(s): PROBNP in the last 8760 hours. HbA1C: Recent Labs    11/22/19 2215  HGBA1C 7.7*   CBG: Recent Labs  Lab 11/23/19 1655 11/23/19 2141 11/24/19 0827 11/24/19 1156 11/24/19 1644  GLUCAP 232* 227* 152* 159* 156*   Lipid Profile: Recent Labs    11/23/19 0527  CHOL 118  HDL 44  LDLCALC 63  TRIG 55  CHOLHDL 2.7   Thyroid Function Tests: No results for input(s): TSH, T4TOTAL, FREET4, T3FREE, THYROIDAB in the last 72 hours. Anemia Panel: Recent Labs    11/22/19 2013  VITAMINB12 1,088*  FOLATE 17.8  FERRITIN 15*  TIBC 480*  IRON 18*  RETICCTPCT 2.3   Sepsis Labs: No results for input(s): PROCALCITON, LATICACIDVEN in the last 168 hours.  Recent Results (from the past 240 hour(s))  SARS Coronavirus 2 by RT PCR (hospital order, performed in [facility] hospital lab) Nasopharyngeal Nasopharyngeal Swab     Status: None   Collection Time: 11/22/19  9:50 PM   Specimen: Nasopharyngeal Swab  Result Value Ref Range Status   SARS Coronavirus 2 NEGATIVE NEGATIVE Final    Comment: (NOTE) SARS-CoV-2 target nucleic acids are NOT DETECTED.  The SARS-CoV-2 RNA is generally  detectable in upper and lower respiratory specimens during the acute phase of infection. The lowest concentration of SARS-CoV-2 viral copies this assay can detect is 250 copies / mL. A negative result does not preclude SARS-CoV-2 infection and should not be used as the sole basis for treatment or other patient management decisions.  A negative result may occur with improper specimen collection / handling, submission of specimen other than nasopharyngeal swab, presence of viral mutation(s) within the areas targeted by this assay, and inadequate number of viral copies (<250 copies / mL). A negative result must be combined with clinical observations, patient history, and epidemiological information.  Fact Sheet for Patients:   StrictlyIdeas.no  Fact Sheet for Healthcare Providers: BankingDealers.co.za  This test is not yet approved or  cleared by the [place] FDA and has been authorized for detection and/or diagnosis of SARS-CoV-2 by FDA under an Emergency Use Authorization (EUA).  This EUA will remain in effect (meaning this test can be used) for the duration of the COVID-19 declaration under Section 564(b)(1) of the Act, 21 U.S.C. section 360bbb-3(b)(1), unless the authorization is terminated or revoked sooner.  Performed at [facility], [address], [place], [place] [address]          Radiology Studies: [facility] 1 View  Result Date: 11/22/2019 CLINICAL DATA:  Chest pain EXAM: PORTABLE CHEST 1 VIEW COMPARISON:  01/17/2018 FINDINGS: Prior median sternotomy and valve replacement. Cardiomegaly. No edema or effusions. Bibasilar opacities could reflect atelectasis or scarring. IMPRESSION: Cardiomegaly.  Bibasilar atelectasis or scarring. Electronically Signed   By: Rolm Baptise M.D.   On: 11/22/2019 20:45   ECHOCARDIOGRAM COMPLETE  Result Date: 11/24/2019    ECHOCARDIOGRAM REPORT   Patient Name:   IKTAN AIKMAN Date of Exam:  11/24/2019 Medical  Rec #:  [id number]    Height:       72.0 in Accession #:    [id number]   Weight:       301.6 lb Date of Birth:  Dec 06, 1950     BSA:          2.537 m Patient Age:    78 years     BP:           103/46 mmHg Patient Gender: M            HR:           66 bpm. Exam Location:  [facility] Procedure: 2D Echo Indications:    Chest Pain 786.50 / R07.9  History:        Patient has prior history of Echocardiogram examinations, most                 recent 10/29/2017. CHF, COPD; Risk Factors:Diabetes and Former                 Smoker. AVR 2002 [employer] Mechanical aortic valve.  Sonographer:    Leavy Cella RDCS (AE) Referring Phys: [id number] [place]  1. Left ventricular ejection fraction, by estimation, is 65 to 70%. The left ventricle has normal function. The left ventricle has no regional wall motion abnormalities. There is moderate left ventricular hypertrophy. Left ventricular diastolic parameters were normal.  2. Right ventricular systolic function is normal. The right ventricular size is normal. There is normal pulmonary artery systolic pressure.  3. The mitral valve is normal in structure. No evidence of mitral valve regurgitation. No evidence of mitral stenosis.  4. History of aortic valve replacement, unknown type     . The aortic valve has been repaired/replaced. Aortic valve regurgitation is not visualized. Aortic valve mean gradient measures 21.8 mmHg.  5. The inferior vena cava is normal in size with greater than 50% respiratory variability, suggesting right atrial pressure of 3 mmHg. FINDINGS  Left Ventricle: History of aortic valve replacement, unknown valve type. Left ventricular ejection fraction, by estimation, is 65 to 70%. The left ventricle has normal function. The left ventricle has no regional wall motion abnormalities. The left ventricular internal cavity size was normal in size. There is moderate left ventricular hypertrophy. Left ventricular diastolic parameters were  normal. Right Ventricle: The right ventricular size is normal. No increase in right ventricular wall thickness. Right ventricular systolic function is normal. There is normal pulmonary artery systolic pressure. The tricuspid regurgitant velocity is 2.31 m/s, and  with an assumed right atrial pressure of 10 mmHg, the estimated right ventricular systolic pressure is 01.0 mmHg. Left Atrium: Left atrial size was normal in size. Right Atrium: Right atrial size was normal in size. Pericardium: There is no evidence of pericardial effusion. Mitral Valve: The mitral valve is normal in structure. There is mild thickening of the mitral valve leaflet(s). There is mild calcification of the mitral valve leaflet(s). Mild mitral annular calcification. No evidence of mitral valve regurgitation. No evidence of mitral valve stenosis. Tricuspid Valve: The tricuspid valve is normal in structure. Tricuspid valve regurgitation is trivial. No evidence of tricuspid stenosis. Aortic Valve: History of aortic valve replacement, unknown type. The aortic valve has been repaired/replaced.. There is mild thickening and mild calcification of the aortic valve. Aortic valve regurgitation is not visualized. Mild aortic valve annular calcification. There is mild thickening of the aortic valve. There is mild calcification of the aortic valve. Aortic valve mean gradient measures  21.8 mmHg. Aortic valve peak gradient measures 40.0 mmHg. Aortic valve area, by VTI measures 1.22 cm. Pulmonic Valve: The pulmonic valve was not well visualized. Pulmonic valve regurgitation is not visualized. No evidence of pulmonic stenosis. Aorta: The aortic root is normal in size and structure. Pulmonary Artery: Indeterminant PASP, inadequate TR jet. Venous: The inferior vena cava is normal in size with greater than 50% respiratory variability, suggesting right atrial pressure of 3 mmHg. IAS/Shunts: The interatrial septum was not well visualized.  LEFT VENTRICLE PLAX 2D  LVIDd:         4.89 cm  Diastology LVIDs:         2.78 cm  LV e' lateral:   13.80 cm/s LV PW:         1.45 cm  LV E/e' lateral: 7.9 LV IVS:        1.53 cm  LV e' medial:    7.83 cm/s LVOT diam:     2.00 cm  LV E/e' medial:  13.9 LV SV:         79 LV SV Index:   31 LVOT Area:     3.14 cm  RIGHT VENTRICLE RV S prime:     11.00 cm/s TAPSE (M-mode): 1.5 cm LEFT ATRIUM             Index       RIGHT ATRIUM           Index LA diam:        3.80 cm 1.50 cm/m  RA Area:     12.30 cm LA Vol (A2C):   82.7 ml 32.60 ml/m RA Volume:   22.60 ml  8.91 ml/m LA Vol (A4C):   62.1 ml 24.48 ml/m LA Biplane Vol: 77.3 ml 30.47 ml/m  AORTIC VALVE AV Area (Vmax):    1.15 cm AV Area (Vmean):   1.04 cm AV Area (VTI):     1.22 cm AV Vmax:           316.33 cm/s AV Vmean:          218.927 cm/s AV VTI:            0.648 m AV Peak Grad:      40.0 mmHg AV Mean Grad:      21.8 mmHg LVOT Vmax:         115.71 cm/s LVOT Vmean:        72.264 cm/s LVOT VTI:          0.252 m LVOT/AV VTI ratio: 0.39  AORTA Ao Root diam: 3.40 cm MITRAL VALVE                TRICUSPID VALVE MV Area (PHT): 3.83 cm     TR Peak grad:   21.3 mmHg MV Decel Time: 198 msec     TR Vmax:        231.00 cm/s MV E velocity: 109.00 cm/s MV A velocity: 108.00 cm/s  SHUNTS MV E/A ratio:  1.01         Systemic VTI:  0.25 m                             Systemic Diam: 2.00 cm Carlyle Dolly MD Electronically signed by Carlyle Dolly MD Signature Date/Time: 11/24/2019/12:01:35 PM    Final         Scheduled Meds: . sodium chloride   Intravenous Once  . atorvastatin  10 mg Oral Daily  .  escitalopram  10 mg Oral QHS  . HYDROcodone-acetaminophen  1 tablet Oral Q8H  . insulin aspart  0-9 Units Subcutaneous TID WC  . lactulose  10 g Oral BID  . levETIRAcetam  1,000 mg Oral BID  . memantine  5 mg Oral BID  . pantoprazole  40 mg Oral Daily  . QUEtiapine  100 mg Oral QHS  . QUEtiapine  25 mg Oral Daily  . spironolactone  50 mg Oral Daily  . Warfarin - Pharmacist Dosing  Inpatient   Does not apply q1600   Continuous Infusions: . ferric gluconate (FERRLECIT/NULECIT) IV Stopped (11/23/19 1531)     LOS: 0 days    Time spent: 35 mins    Kathie Dike, MD [facility]   If 7PM-7AM, please contact night-coverage [email or web address]  11/24/2019, 7:00 PM

## 2019-11-24 NOTE — Consult Note (Signed)
Referring Provider: Kathie Dike, MD Primary Care Physician:  [name], MD Primary Gastroenterologist:  Dr. Laural Golden  Reason for Consultation:    Rectal bleeding.  HPI:   Patient is 69 year old Caucasian male with multiple medical problems which include dementia COPD diabetes mellitus chronic GERD complicated by short segment Barrett's esophagus as well as history of cirrhosis secondary to hepatitis C which has been successfully treated.  He also has seizure disorder prosthetic aortic valve and and status post partial colectomy for diverticulitis about 10 years ago. Patient was admitted to this facility 2 days ago for chest pain.  Pain was felt to be noncardiac.  Patient was being discharged yesterday but he was noted to have passed blood per rectum by the nursing staff.  Discharge was therefore placed on hold.  Patient was noted to have heme-negative stool at the time of admission when his hemoglobin was 7.7 g. Patient denies abdominal pain nausea vomiting heartburn or dysphagia.  He is hungry. Patient does not recall having ever had a colonoscopy.  His daughter Ms. [name] who is at bedside does not remember him having had a colonoscopy.  He did have upper endoscopy in October 2019 for hematemesis while he was hospitalized in [place].  He had grade C reflux esophagitis short segment Barrett's esophagus and Mallory-Weiss tear was felt to the source of GI bleed.  While I was interviewing patient he had a bowel movement and he passed maybe an ounce of burgundy blood with small clots.  Patient is married but separated.  He has son and daughter.  He is retired from [employer] where he worked for 27 years.  He does not drink alcohol.  He used to smoke cigarettes but not anymore.  He has been staying at assisted living since July 2020.   Family history significant for colon carcinoma in his father who was in his 36s when he was diagnosed in died in his 73s.  Mother had COPD. He has  one brother and 3 sisters.  One sister has had colonic polyps removed.  Past Medical History:  Diagnosis Date  . COPD (chronic obstructive pulmonary disease) ([place])   . Dementia ([place])   . Depression   . Diabetes mellitus without complication ([place])   . GERD (gastroesophageal reflux disease)   . H/O diverticulitis of colon 11/03/2012   History of partial colectomy in 2011.  Marland Kitchen Hepatitis C   . High cholesterol   . Lower extremity edema   . Mechanical heart valve present   . Overdose of muscle relaxant 08/11/2017  . Pre-diabetes   . Retroperitoneal hematoma 01/29/2018  . S/P AVR (aortic valve replacement)   . Seizures ([place])   . 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 CARDIAC SURGERY    . CHOLECYSTECTOMY    . COLON SURGERY    . CORONARY/GRAFT ANGIOGRAPHY N/A 10/31/2017   Procedure: CORONARY/GRAFT ANGIOGRAPHY;  Surgeon: Nelva Bush, MD;  Location: [facility];  Service: Cardiovascular;  Laterality: N/A;  . ESOPHAGOGASTRODUODENOSCOPY (EGD) WITH PROPOFOL N/A 01/18/2018   Procedure: ESOPHAGOGASTRODUODENOSCOPY (EGD) WITH PROPOFOL;  Surgeon: Mauri Pole, MD;  Location: [facility];  Service: Endoscopy;  Laterality: N/A;  . JOINT REPLACEMENT    . KNEE SURGERY Left   . tooth pulled  2021    Prior to Admission medications   Medication Sig Start Date End Date Taking? Authorizing Provider  atorvastatin (LIPITOR) 10 MG tablet Take 1 tablet (10  mg total) by mouth daily. 08/03/19  Yes [name], MD  bacitracin ointment Apply 1 application topically daily. 11/11/19  Yes Hoffman, Jessica Ratliff, DO  carvedilol (COREG) 6.25 MG tablet Take 1 tablet (6.25 mg total) by mouth 2 (two) times daily with a meal. 10/20/19  Yes [name], MD  chlorhexidine (PERIDEX) 0.12 % solution Use as directed 15 mLs in the mouth or throat 2 (two) times daily.   Yes [provider]  escitalopram (LEXAPRO) 10 MG tablet Take 1 tablet  (10 mg total) by mouth at bedtime. 11/10/19  Yes [name], MD  furosemide (LASIX) 80 MG tablet Take 80 mg by mouth 2 (two) times daily. 10/28/19  Yes [provider]  glucose blood test strip Test blood sugar 3 times daily before meals 06/22/19  Yes [name], MD  HYDROcodone-acetaminophen (NORCO/VICODIN) 5-325 MG tablet Take 1 tablet by mouth every 12 (twelve) hours as needed for moderate pain. 11/10/19  Yes [name], MD  insulin aspart (NOVOLOG FLEXPEN) 100 UNIT/ML FlexPen INJECT 10 UNITS INTO THE SKIN 3 (THREE) TIMES DAILY WITH MEALS. ADJUST DOSE AS NEEDED 03/19/19  Yes [name], MD  insulin detemir (LEVEMIR) 100 UNIT/ML injection Inject 0.28 mLs (28 Units total) into the skin at bedtime. Increase Levemir by 2 units when the week average is >200. 11/06/18  Yes [name], MD  Insulin Pen Needle (ADVOCATE INSULIN PEN NEEDLES) 31G X 5 MM MISC 1 Package by Does not apply route 4 (four) times daily. 02/03/19  Yes [name], MD  lactulose (CHRONULAC) 10 GM/15ML solution Take 15 mLs (10 g total) by mouth 2 (two) times daily. 06/15/19  Yes [name], MD  Lancets MISC 1 Stick by Does not apply route 3 (three) times daily with meals. 06/22/19  Yes [name], MD  levETIRAcetam (KEPPRA) 1000 MG tablet Take 1 tablet (1,000 mg total) by mouth 2 (two) times daily. 09/17/18  Yes Cameron Sprang, MD  pantoprazole (PROTONIX) 40 MG tablet Take 1 tablet (40 mg total) by mouth daily. 07/14/18  Yes [name], MD  QUEtiapine (SEROQUEL) 100 MG tablet TAKE 1 TABLETS IN THE EVENING 11/10/19  Yes [name], MD  QUEtiapine (SEROQUEL) 25 MG tablet Take 1 tablet in AM 08/16/19  Yes Cameron Sprang, MD  spironolactone (ALDACTONE) 50 MG tablet Take 50 mg by mouth daily.   Yes [provider]  warfarin (COUMADIN) 4 MG tablet Take 1 tablet (4 mg total) by mouth daily. 07/14/18  Yes [name], MD  albuterol (VENTOLIN HFA) 108 (90 Base) MCG/ACT inhaler Inhale 2 puffs into the lungs every  4 (four) hours as needed for wheezing or shortness of breath. 11/06/18   [name], MD  ferrous sulfate 325 (65 FE) MG EC tablet Take 1 tablet (325 mg total) by mouth 2 (two) times daily. 11/23/19 11/22/20  Kathie Dike, MD  memantine (NAMENDA) 5 MG tablet Take 5 mg by mouth 2 (two) times daily.    [provider]    Current Facility-Administered Medications  Medication Dose Route Frequency Provider Last Rate Last Admin  . 0.9 %  sodium chloride infusion (Manually program via Guardrails IV Fluids)   Intravenous Once [name], MD      . acetaminophen (TYLENOL) tablet 650 mg  650 mg Oral Q4H PRN [name], MD      . albuterol (PROVENTIL) (2.5 MG/3ML) 0.083% nebulizer solution 2.5 mg  2.5 mg Nebulization Q4H PRN [name], MD      . atorvastatin (LIPITOR) tablet 10 mg  10 mg Oral Daily [name], MD   10 mg at 11/23/19 0954  . escitalopram (LEXAPRO) tablet 10 mg  10 mg Oral QHS [name], MD   10 mg at 11/23/19 2203  . ferric gluconate (NULECIT) 250 mg in sodium chloride 0.9 % 100 mL IVPB  250 mg Intravenous Q48H Kathie Dike, MD   Stopped at 11/23/19 1531  . HYDROcodone-acetaminophen (NORCO/VICODIN) 5-325 MG per tablet 1 tablet  1 tablet Oral Q8H Kathie Dike, MD   1 tablet at 11/24/19 0537  . insulin aspart (novoLOG) injection 0-9 Units  0-9 Units Subcutaneous TID WC [name], MD   2 Units at 11/24/19 1256  . lactulose (CHRONULAC) 10 GM/15ML solution 10 g  10 g Oral BID [name], MD   10 g at 11/23/19 2205  . levETIRAcetam (KEPPRA) tablet 1,000 mg  1,000 mg Oral BID [name], MD   1,000 mg at 11/23/19 2203  . memantine (NAMENDA) tablet 5 mg  5 mg Oral BID [name], MD   5 mg at 11/23/19 2204  . ondansetron (ZOFRAN) injection 4 mg  4 mg Intravenous Q6H PRN [name], MD      . pantoprazole (PROTONIX) EC tablet 40 mg  40 mg  Oral Daily [name], MD   40 mg at 11/23/19 0953  . QUEtiapine (SEROQUEL) tablet 100 mg  100 mg Oral QHS [name], MD   100 mg at 11/23/19 2203  . QUEtiapine (SEROQUEL) tablet 25 mg  25 mg Oral Daily [name], MD   25 mg at 11/23/19 0953  . spironolactone (ALDACTONE) tablet 50 mg  50 mg Oral Daily [name], MD   50 mg at 11/23/19 0953  . Warfarin - Pharmacist Dosing Inpatient   Does not apply q1600 [name], MD        Allergies as of 11/22/2019 - Review Complete 11/22/2019  Allergen Reaction Noted  . Oxycodone Anxiety 06/15/2019  . Ativan [lorazepam]  02/02/2019  . Morphine and related Other (See Comments) 12/21/2017    Family History  Problem Relation Age of Onset  . Hypertension Father     Social History   Socioeconomic History  . Marital status: Married    Spouse name: Not on file  . Number of children: 3  . Years of education: Not on file  . Highest education level: 12th grade  Occupational History  . Occupation: retired  Tobacco Use  . Smoking status: Former Smoker    Packs/day: 1.00    Types: Cigarettes  . Smokeless tobacco: Never Used  Vaping Use  . Vaping Use: Never used  Substance and Sexual Activity  . Alcohol use: Not Currently  . Drug use: Never  . Sexual activity: Not Currently  Other Topics Concern  . Not on file  Social History Narrative   Right handed       [facility]   Social Determinants of Health   Financial Resource Strain:   . Difficulty of Paying Living Expenses:   Food Insecurity:   . Worried About Charity fundraiser in the Last Year:   . Arboriculturist in the Last Year:   Transportation Needs:   . Film/video editor (Medical):   Marland Kitchen Lack of Transportation (Non-Medical):   Physical Activity:   .  Days of Exercise per Week:   . Minutes of Exercise per Session:   Stress:   . Feeling of Stress :   Social Connections:   . Frequency of  Communication with Friends and Family:   . Frequency of Social Gatherings with Friends and Family:   . Attends Religious Services:   . Active Member of Clubs or Organizations:   . Attends Archivist Meetings:   Marland Kitchen Marital Status:   Intimate Partner Violence:   . Fear of Current or Ex-Partner:   . Emotionally Abused:   Marland Kitchen Physically Abused:   . Sexually Abused:     Review of Systems: See HPI, otherwise normal ROS  Physical Exam: Temp:  [97.7 F (36.5 C)-98.3 F (36.8 C)] 98.3 F (36.8 C) (08/11 0933) Pulse Rate:  [59-83] 66 (08/11 0933) Resp:  [16-20] 19 (08/11 0933) BP: (97-140)/(46-93) 103/46 (08/11 0933) SpO2:  [93 %-100 %] 100 % (08/11 0933) Weight:  [136.8 kg] 136.8 kg (08/10 2128) Last BM Date: 11/24/19  Patient is alert and responds appropriately to questions.  He knows that he is in the hospital. Conjunctiva is pale.  Sclerae nonicteric. Oropharyngeal mucosa is normal.   He has few remaining teeth in upper and lower jaw. Neck without adenopathy masses or thyromegaly. Cardiac exam with regular rhythm with normal S1 and loud S2.  Faint systolic ejection murmur best heard at aortic area. Auscultation of lungs reveal vesicular breath sounds bilaterally. Abdomen is protuberant.  He has small midline scar above the level of umbilicus.  On palpation abdomen is soft and nontender.  Left lobe of the liver appears to be prominent but difficult to discern the outline.  Spleen is not palpable. He does not have peripheral edema or clubbing.     Lab Results: Recent Labs    11/22/19 2013 11/22/19 2013 11/23/19 0527 11/23/19 1737 11/23/19 2314 11/24/19 0435 11/24/19 1112  WBC 5.7  --  6.4  --   --   --   --   HGB 7.7*   < > 8.0*   < > 7.2* 7.7* 7.8*  HCT 27.1*   < > 28.4*   < > 25.1* 27.3* 27.1*  PLT 108*  --  107*  --   --   --   --    < > = values in this interval not displayed.   BMET Recent Labs    11/22/19 2013 11/23/19 0527  NA 132* 136  K 3.4* 3.5   CL 96* 100  CO2 28 25  GLUCOSE 271* 143*  BUN 22 22  CREATININE 1.56* 1.35*  CALCIUM 8.3* 8.4*   LFT Recent Labs    11/22/19 2013  PROT 7.0  ALBUMIN 3.2*  AST 28  ALT 19  ALKPHOS 162*  BILITOT 1.0   PT/INR Recent Labs    11/23/19 0527 11/24/19 0435  LABPROT 27.8* 31.8*  INR 2.7* 3.2*      Assessment;  Patient is 70 year old Caucasian male with multiple comorbidities who is anticoagulated for prosthetic aortic valve who was hospitalized 2 days ago for chest pain.  Patient experienced rectal bleeding prior to discharge yesterday.  He has chronic anemia but his hemoglobin dropped by more than a gram leading to transfusion with 1 unit of PRBCs. Patient is now passing maroonish blood per rectum.  He has history of diverticulitis status post resection of part of his colon presumably sigmoid colon several years ago.  No record of prior colonoscopies.  Family history significant for CRC in  his father who was diagnosed in his 53s and died few days later. Differential diagnosis includes GI angiodysplasia colonic diverticular bleed or neoplasm. Patient's INR this morning was 3.2.  As discussed with Dr. Roderic Palau needs to be corrected to at least below 2 if not around 1.5 in case polypectomy needed.  Cirrhosis secondary to hepatitis C which has been successfully treated by Dr. De Burrs of: ID service.  Ultrasound from last week reveals no focal abnormalities concerning for neoplasm.  Chronic GERD complicated by short segment Barrett's esophagus.  He had EGD in October 2019.  No indication to examine upper GI tract at this time.  Recommendations;  Patient to receive FFP to correct coagulopathy. Diagnostic colonoscopy with therapeutic intention to be performed on 11/25/2019. Continue to monitor patient's H&H. CBC and INR in a.m.  Patient's condition discussed with his daughter Ms. [name] who is the power of attorney and is agreeable to proceed with colonoscopy tomorrow. Patient  will need monitored anesthesia for this procedure.   LOS: 0 days   Remington Skalsky  11/24/2019, 2:07 PM

## 2019-11-25 ENCOUNTER — Ambulatory Visit: Payer: Medicare Other | Admitting: Gastroenterology

## 2019-11-25 ENCOUNTER — Ambulatory Visit: Payer: Medicare Other | Admitting: Internal Medicine

## 2019-11-25 ENCOUNTER — Inpatient Hospital Stay (HOSPITAL_COMMUNITY): Payer: Medicare Other | Admitting: Anesthesiology

## 2019-11-25 ENCOUNTER — Encounter (HOSPITAL_COMMUNITY)
Admission: EM | Disposition: A | Discharge status: Home Health | Payer: Self-pay | Source: Skilled Nursing Facility | Attending: Internal Medicine

## 2019-11-25 ENCOUNTER — Encounter (HOSPITAL_COMMUNITY): Payer: Self-pay | Admitting: Internal Medicine

## 2019-11-25 DIAGNOSIS — B182 Chronic viral hepatitis C: Secondary | ICD-10-CM | POA: Diagnosis not present

## 2019-11-25 DIAGNOSIS — K644 Residual hemorrhoidal skin tags: Secondary | ICD-10-CM

## 2019-11-25 DIAGNOSIS — D175 Benign lipomatous neoplasm of intra-abdominal organs: Secondary | ICD-10-CM

## 2019-11-25 DIAGNOSIS — K5521 Angiodysplasia of colon with hemorrhage: Secondary | ICD-10-CM

## 2019-11-25 DIAGNOSIS — D124 Benign neoplasm of descending colon: Secondary | ICD-10-CM

## 2019-11-25 DIAGNOSIS — D649 Anemia, unspecified: Secondary | ICD-10-CM | POA: Diagnosis not present

## 2019-11-25 DIAGNOSIS — Z980 Intestinal bypass and anastomosis status: Secondary | ICD-10-CM

## 2019-11-25 DIAGNOSIS — Z7901 Long term (current) use of anticoagulants: Secondary | ICD-10-CM | POA: Diagnosis not present

## 2019-11-25 HISTORY — PX: COLONOSCOPY WITH PROPOFOL: SHX5780

## 2019-11-25 HISTORY — PX: POLYPECTOMY: SHX5525

## 2019-11-25 LAB — TYPE AND SCREEN
ABO/RH(D): A POS
Antibody Screen: NEGATIVE
Unit division: 0
Unit division: 0

## 2019-11-25 LAB — CBC
HCT: 23.6 % — ABNORMAL LOW (ref 39.0–52.0)
Hemoglobin: 6.9 g/dL — CL (ref 13.0–17.0)
MCH: 22 pg — ABNORMAL LOW (ref 26.0–34.0)
MCHC: 29.2 g/dL — ABNORMAL LOW (ref 30.0–36.0)
MCV: 75.2 fL — ABNORMAL LOW (ref 80.0–100.0)
Platelets: 101 10*3/uL — ABNORMAL LOW (ref 150–400)
RBC: 3.14 MIL/uL — ABNORMAL LOW (ref 4.22–5.81)
RDW: 20.1 % — ABNORMAL HIGH (ref 11.5–15.5)
WBC: 5.9 10*3/uL (ref 4.0–10.5)
nRBC: 0 % (ref 0.0–0.2)

## 2019-11-25 LAB — GLUCOSE, CAPILLARY
Glucose-Capillary: 161 mg/dL — ABNORMAL HIGH (ref 70–99)
Glucose-Capillary: 161 mg/dL — ABNORMAL HIGH (ref 70–99)
Glucose-Capillary: 170 mg/dL — ABNORMAL HIGH (ref 70–99)
Glucose-Capillary: 219 mg/dL — ABNORMAL HIGH (ref 70–99)

## 2019-11-25 LAB — BPAM FFP
Blood Product Expiration Date: 202108162359
Blood Product Expiration Date: 202108162359
ISSUE DATE / TIME: 202108111749
ISSUE DATE / TIME: 202108111952
Unit Type and Rh: 6200
Unit Type and Rh: 6200

## 2019-11-25 LAB — PREPARE FRESH FROZEN PLASMA
Unit division: 0
Unit division: 0

## 2019-11-25 LAB — BASIC METABOLIC PANEL
Anion gap: 9 (ref 5–15)
BUN: 18 mg/dL (ref 8–23)
CO2: 28 mmol/L (ref 22–32)
Calcium: 8.6 mg/dL — ABNORMAL LOW (ref 8.9–10.3)
Chloride: 99 mmol/L (ref 98–111)
Creatinine, Ser: 1.2 mg/dL (ref 0.61–1.24)
GFR calc Af Amer: >60 mL/min (ref >60)
GFR calc non Af Amer: >60 mL/min (ref >60)
Glucose, Bld: 140 mg/dL — ABNORMAL HIGH (ref 70–99)
Potassium: 3.7 mmol/L (ref 3.5–5.1)
Sodium: 136 mmol/L (ref 135–145)

## 2019-11-25 LAB — BPAM RBC
Blood Product Expiration Date: 202108222359
Blood Product Expiration Date: 202108262359
ISSUE DATE / TIME: 202108110701
ISSUE DATE / TIME: 202108111158
Unit Type and Rh: 600
Unit Type and Rh: 6200

## 2019-11-25 LAB — PROTIME-INR
INR: 2 — ABNORMAL HIGH (ref 0.8–1.2)
INR: 1.8 — ABNORMAL HIGH (ref 0.8–1.2)
Prothrombin Time: 22.1 seconds — ABNORMAL HIGH (ref 11.4–15.2)
Prothrombin Time: 20.3 seconds — ABNORMAL HIGH (ref 11.4–15.2)

## 2019-11-25 LAB — PREPARE RBC (CROSSMATCH)

## 2019-11-25 LAB — HEMOGLOBIN AND HEMATOCRIT, BLOOD
HCT: 27.8 % — ABNORMAL LOW (ref 39.0–52.0)
Hemoglobin: 8 g/dL — ABNORMAL LOW (ref 13.0–17.0)

## 2019-11-25 SURGERY — COLONOSCOPY WITH PROPOFOL
Anesthesia: General

## 2019-11-25 MED ORDER — PHENYLEPHRINE HCL (PRESSORS) 10 MG/ML IV SOLN
INTRAVENOUS | Status: DC | PRN
  Administered 2019-11-25 (×2): 120 ug via INTRAVENOUS

## 2019-11-25 MED ORDER — LACTATED RINGERS IV SOLN
INTRAVENOUS | Status: DC | PRN
Start: 2019-11-25 — End: 2019-11-25

## 2019-11-25 MED ORDER — LIDOCAINE HCL (CARDIAC) PF 100 MG/5ML IV SOSY
PREFILLED_SYRINGE | INTRAVENOUS | Status: DC | PRN
  Administered 2019-11-25: 100 mg via INTRAVENOUS

## 2019-11-25 MED ORDER — SODIUM CHLORIDE 0.9% IV SOLUTION: Freq: Once | INTRAVENOUS | Status: AC

## 2019-11-25 MED ORDER — MIDAZOLAM HCL 2 MG/2ML IJ SOLN
INTRAMUSCULAR | Status: AC
  Filled 2019-11-25: qty 2

## 2019-11-25 MED ORDER — SODIUM CHLORIDE 0.9 % IV SOLN: INTRAVENOUS | Status: DC

## 2019-11-25 MED ORDER — PROPOFOL 10 MG/ML IV BOLUS
INTRAVENOUS | Status: AC
  Filled 2019-11-25: qty 20

## 2019-11-25 MED ORDER — SODIUM CHLORIDE 0.9% IV SOLUTION: Freq: Once | INTRAVENOUS | Status: DC

## 2019-11-25 MED ORDER — PHENYLEPHRINE 40 MCG/ML (10ML) SYRINGE FOR IV PUSH (FOR BLOOD PRESSURE SUPPORT)
PREFILLED_SYRINGE | INTRAVENOUS | Status: AC
  Filled 2019-11-25: qty 10

## 2019-11-25 MED ORDER — PROPOFOL 10 MG/ML IV BOLUS
INTRAVENOUS | Status: AC
  Filled 2019-11-25: qty 40

## 2019-11-25 MED ORDER — WARFARIN SODIUM 2 MG PO TABS
4.0000 mg | ORAL_TABLET | Freq: Once | ORAL | Status: AC
  Administered 2019-11-25: 4 mg via ORAL
  Filled 2019-11-25 (×2): qty 2

## 2019-11-25 MED ORDER — LIDOCAINE 2% (20 MG/ML) 5 ML SYRINGE
INTRAMUSCULAR | Status: AC
  Filled 2019-11-25: qty 5

## 2019-11-25 MED ORDER — LACTATED RINGERS IV SOLN: INTRAVENOUS | Status: DC

## 2019-11-25 MED ORDER — HEPARIN (PORCINE) 25000 UT/250ML-% IV SOLN
1600.0000 [IU]/h | INTRAVENOUS | Status: DC
  Administered 2019-11-26: 1600 [IU]/h via INTRAVENOUS
  Administered 2019-11-26: 1500 [IU]/h via INTRAVENOUS
  Administered 2019-11-27 – 2019-11-28 (×2): 1600 [IU]/h via INTRAVENOUS
  Filled 2019-11-25 (×4): qty 250

## 2019-11-25 MED ORDER — MIDAZOLAM HCL 5 MG/5ML IJ SOLN
INTRAMUSCULAR | Status: DC | PRN
  Administered 2019-11-25: 2 mg via INTRAVENOUS

## 2019-11-25 MED ORDER — PROPOFOL 500 MG/50ML IV EMUL
INTRAVENOUS | Status: DC | PRN
  Administered 2019-11-25: 100 ug/kg/min via INTRAVENOUS

## 2019-11-25 NOTE — OR Nursing (Signed)
Patient daughter has his wedding ring. She remained upstairs in patient room

## 2019-11-25 NOTE — Progress Notes (Signed)
No additional cardiology recs today, please call with questions. We will signoff inpatient care   Carlyle Dolly MD

## 2019-11-25 NOTE — Progress Notes (Signed)
Subjective:  Patient states he was able to finish the prep.Adam Savage  He is passing clear yellow watery stool.  He denies chest pain shortness of breath or abdominal pain. He is complaining that he does not get timely help when he calls for help.  Current Medications:  Current Facility-Administered Medications:  .  0.9 %  sodium chloride infusion (Manually program via Guardrails IV Fluids), , Intravenous, Once, [name], MD .  0.9 %  sodium chloride infusion (Manually program via Guardrails IV Fluids), , Intravenous, Once, [name], [name], DO .  0.9 %  sodium chloride infusion (Manually program via Guardrails IV Fluids), , Intravenous, Once, [name], [name], MD .  acetaminophen (TYLENOL) tablet 650 mg, 650 mg, Oral, Q4H PRN, [name], [name], MD .  albuterol (PROVENTIL) (2.5 MG/3ML) 0.083% nebulizer solution 2.5 mg, 2.5 mg, Nebulization, Q4H PRN, [name], MD .  atorvastatin (LIPITOR) tablet 10 mg, 10 mg, Oral, Daily, [name], MD, 10 mg at 11/23/19 0954 .  escitalopram (LEXAPRO) tablet 10 mg, 10 mg, Oral, QHS, [name], MD, 10 mg at 11/24/19 2107 .  ferric gluconate (NULECIT) 250 mg in sodium chloride 0.9 % 100 mL IVPB, 250 mg, Intravenous, Q48H, Kathie Dike, MD, Stopped at 11/23/19 1531 .  HYDROcodone-acetaminophen (NORCO/VICODIN) 5-325 MG per tablet 1 tablet, 1 tablet, Oral, Q8H, Kathie Dike, MD, 1 tablet at 11/25/19 0608 .  insulin aspart (novoLOG) injection 0-9 Units, 0-9 Units, Subcutaneous, TID WC, [name], MD, 2 Units at 11/24/19 1713 .  lactulose (CHRONULAC) 10 GM/15ML solution 10 g, 10 g, Oral, BID, [name], MD, 10 g at 11/24/19 2108 .  levETIRAcetam (KEPPRA) tablet 1,000 mg, 1,000 mg, Oral, BID, [name], MD, 1,000 mg at 11/24/19 2107 .  memantine (NAMENDA) tablet 5 mg, 5 mg, Oral, BID, [name], MD, 5 mg at 11/24/19 2107 .  ondansetron  (ZOFRAN) injection 4 mg, 4 mg, Intravenous, Q6H PRN, [name], MD .  pantoprazole (PROTONIX) EC tablet 40 mg, 40 mg, Oral, Daily, [name], MD, 40 mg at 11/23/19 0953 .  QUEtiapine (SEROQUEL) tablet 100 mg, 100 mg, Oral, QHS, [name], MD, 100 mg at 11/24/19 2106 .  QUEtiapine (SEROQUEL) tablet 25 mg, 25 mg, Oral, Daily, [name], MD, 25 mg at 11/23/19 0953 .  spironolactone (ALDACTONE) tablet 50 mg, 50 mg, Oral, Daily, [name], MD, 50 mg at 11/23/19 0953 .  Warfarin - Pharmacist Dosing Inpatient, , Does not apply, q1600, [name], MD   Objective: Blood pressure (!) 117/52, pulse 84, temperature 97.7 F (36.5 C), temperature source Oral, resp. rate 17, height 6' (1.829 m), weight (!) 136.8 kg, SpO2 100 %. Patient is alert and in no acute distress. He is sitting at the edge of the bed. Cardiac exam with regular rhythm normal S1 with loud S2 and faint systolic murmur at aortic area. Lungs are clear to auscultation. Abdomen is protuberant but soft and nontender with organomegaly or masses. He does not have peripheral edema.  Labs/studies Results:  CBC Latest Ref Rng & Units 11/25/2019 11/24/2019 11/24/2019  WBC 4.0 - 10.5 K/uL 5.9 - -  Hemoglobin 13.0 - 17.0 g/dL 6.9(LL) 7.7(L) 8.1(L)  Hematocrit 39 - 52 % 23.6(L) 26.9(L) 28.3(L)  Platelets 150 - 400 K/uL 101(L) - -    CMP Latest Ref Rng & Units 11/25/2019 11/23/2019 11/22/2019  Glucose 70 - 99 mg/dL 140(H) 143(H) 271(H)  BUN 8 - 23 mg/dL  _0 Creatinine 0.61 - 1.24 mg/dL 1.20 1.35(H) 1.56(H)  Sodium 135 - 145 mmol/L 136 136 132(L)  Potassium 3.5 - 5.1 mmol/L 3.7 3.5 3.4(L)  Chloride 98 - 111 mmol/L 99 100 96(L)  CO2 22 - 32 mmol/L _1 Calcium 8.9 - 10.3 mg/dL 8.6(L) 8.4(L) 8.3(L)  Total Protein 6.5 - 8.1 g/dL - - 7.0  Total Bilirubin 0.3 - 1.2 mg/dL - - 1.0  Alkaline Phos 38 - 126 U/L - - 162(H)  AST 15 - 41 U/L - - 28  ALT  0 - 44 U/L - - 19    Hepatic Function Latest Ref Rng & Units 11/22/2019 06/15/2019 05/26/2019  Total Protein 6.5 - 8.1 g/dL 7.0 7.6 6.7  Albumin 3.5 - 5.0 g/dL 3.2(L) 3.9 -  AST 15 - 41 U/L _2 ALT 0 - 44 U/L _3 Alk Phosphatase 38 - 126 U/L 162(H) 208(H) -  Total Bilirubin 0.3 - 1.2 mg/dL 1.0 0.7 0.8  Bilirubin, Direct 0.0 - 0.2 mg/dL - - -    INR 2.0  Assessment:  Rectal bleeding.  Patient received 1 unit of PRBCs night before last.  His hemoglobin has dropped.  He has stopped bleeding.  He is to get another unit of PRBCs this morning. Coagulopathy has partially corrected with fresh frozen plasma. INR should be lower than 2 by the time of colonoscopy undertaken.  Plan:  Transfusion with 1 unit of PRBCs as planned. Colonoscopy later today.

## 2019-11-25 NOTE — Progress Notes (Signed)
Patient returned to floor from endo. 1st unit of PRBC still infusing

## 2019-11-25 NOTE — Progress Notes (Signed)
Brief colonoscopy note.  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 2 diverticuli at descending colon without stigmata of bleed. 2 polyps cold snared from descending colon measuring 6 and 7 mm. Clip applied to each polypectomy site to control persistent oozing. 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  It was ablated with argon plasma coagulator. Patent colonic anastomosis at 23 cm from the anal margin. Small external hemorrhoids.

## 2019-11-25 NOTE — Transfer of Care (Signed)
Immediate Anesthesia Transfer of Care Note  Patient: Adam Savage  Procedure(s) Performed: COLONOSCOPY WITH PROPOFOL (N/A ) POLYPECTOMY  Patient Location: PACU  Anesthesia Type:General  Level of Consciousness: awake, alert , oriented, drowsy and patient cooperative  Airway & Oxygen Therapy: Patient Spontanous Breathing and Patient connected to nasal cannula oxygen  Post-op Assessment: Report given to RN, Post -op Vital signs reviewed and stable and Patient moving all extremities X 4  Post vital signs: Reviewed and stable  Last Vitals:  Vitals Value Taken Time  BP 93/64 11/25/19 1151  Temp    Pulse 77 11/25/19 1153  Resp 14 11/25/19 1153  SpO2 100 % 11/25/19 1153  Vitals shown include unvalidated device data.  Last Pain:  Vitals:   11/25/19 1044  TempSrc: Oral  PainSc:       Patients Stated Pain Goal: 0 (25/49/82 6415)  Complications: No complications documented.

## 2019-11-25 NOTE — Progress Notes (Signed)
CRITICAL VALUE ALERT  Critical Value:  Hgb 6.9   Date & Time Notied:  11/25/19 3414  Provider Notified: Dr. Caleb Popp  Orders Received/Actions taken: Awaiting orders.

## 2019-11-25 NOTE — Anesthesia Preprocedure Evaluation (Signed)
Anesthesia Evaluation  Patient identified by MRN, date of birth, ID band Patient awake    Reviewed: Allergy & Precautions, H&P , NPO status , Patient's Chart, lab work & pertinent test results, reviewed documented beta blocker date and time   Airway Mallampati: II  TM Distance: >3 FB Neck ROM: full    Dental no notable dental hx.    Pulmonary sleep apnea , COPD,  COPD inhaler, former smoker,    Pulmonary exam normal breath sounds clear to auscultation       Cardiovascular Exercise Tolerance: Good negative cardio ROS   Rhythm:regular Rate:Normal     Neuro/Psych  Headaches, Seizures -, Well Controlled,  PSYCHIATRIC DISORDERS Depression Dementia    GI/Hepatic GERD  Medicated,(+) Hepatitis -, C  Endo/Other  negative endocrine ROSdiabetes  Renal/GU negative Renal ROS  negative genitourinary   Musculoskeletal   Abdominal   Peds  Hematology  (+) Blood dyscrasia, anemia ,   Anesthesia Other Findings PRBCs being transfused, first unit already in.  No known CAD with acceptable workup within last 12 months.  Proceeding based on anemia being partially treated and risk of further bleeding.  Reproductive/Obstetrics negative OB ROS                             Anesthesia Physical Anesthesia Plan  ASA: IV and emergent  Anesthesia Plan: General   Post-op Pain Management:    Induction:   PONV Risk Score and Plan: 2 and Propofol infusion  Airway Management Planned:   Additional Equipment:   Intra-op Plan:   Post-operative Plan:   Informed Consent: I have reviewed the patients History and Physical, chart, labs and discussed the procedure including the risks, benefits and alternatives for the proposed anesthesia with the patient or authorized representative who has indicated his/her understanding and acceptance.     Dental Advisory Given  Plan Discussed with: CRNA  Anesthesia Plan Comments:          Anesthesia Quick Evaluation

## 2019-11-25 NOTE — Progress Notes (Addendum)
ANTICOAGULATION CONSULT NOTE -   Pharmacy Consult for Warfarin Indication: Mechanical Valve  Allergies  Allergen Reactions  . Oxycodone Anxiety  . Ativan [Lorazepam]   . Morphine And Related Other (See Comments)    Combative     Patient Measurements: Height: 6' (182.9 cm) Weight: (!) 136.8 kg (301 lb 9.4 oz) IBW/kg (Calculated) : 77.6 Heparin Dosing Weight: 109 kg  Vital Signs: Temp: 97.8 F (36.6 C) (08/12 1303) Temp Source: Oral (08/12 1303) BP: 118/59 (08/12 1303) Pulse Rate: 82 (08/12 1303)  Labs: Recent Labs    11/22/19 2013 11/22/19 2013 11/22/19 2215 11/23/19 0527 11/23/19 1047 11/23/19 1737 11/24/19 0435 11/24/19 1112 11/24/19 1954 11/24/19 1954 11/25/19 0557 11/25/19 1455  HGB 7.7*   < >  --  8.0*  --    < > 7.7*   < > 7.7*   < > 6.9* 8.0*  HCT 27.1*   < >  --  28.4*  --    < > 27.3*   < > 26.9*  --  23.6* 27.8*  PLT 108*  --   --  107*  --   --   --   --   --   --  101*  --   LABPROT 25.9*   < >  --  27.8*  --   --  31.8*  --   --   --  22.1*  --   INR 2.5*   < >  --  2.7*  --   --  3.2*  --   --   --  2.0*  --   CREATININE 1.56*  --   --  1.35*  --   --   --   --   --   --  1.20  --   TROPONINIHS 7   < > 7 7 7   --   --   --   --   --   --   --    < > = values in this interval not displayed.    Estimated Creatinine Clearance: 83.2 mL/min (by C-G formula based on SCr of 1.2 mg/dL).   Medical History: Past Medical History:  Diagnosis Date  . COPD (chronic obstructive pulmonary disease) ([place])   . Dementia ([place])   . Depression   . Diabetes mellitus without complication ([place])   . GERD (gastroesophageal reflux disease)   . H/O diverticulitis of colon 11/03/2012   History of partial colectomy in 2011.  [place]land Kitchen Hepatitis C   . High cholesterol   . Lower extremity edema   . Mechanical heart valve present   . Overdose of muscle relaxant 08/11/2017  . Pre-diabetes   . Retroperitoneal hematoma 01/29/2018  . S/P AVR (aortic valve replacement)   . Seizures  ([place])   . 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 atorvastatin (LIPITOR) 10 MG tablet Take 1 tablet (10 mg total) by mouth daily. 90 tablet 3 11/22/2019 at 1700  . bacitracin ointment Apply 1 application topically daily. 120 g 0 11/22/2019 at 0700  . carvedilol (COREG) 6.25 MG tablet Take 1 tablet (6.25 mg total) by mouth 2 (two) times daily with a meal. 180 tablet 1 11/22/2019 at 1700  . chlorhexidine (PERIDEX) 0.12 % solution Use as directed 15 mLs in the mouth or throat 2 (two) times daily.   11/21/2019 at 2000  . escitalopram (LEXAPRO) 10 MG tablet Take 1  tablet (10 mg total) by mouth at bedtime. 90 tablet 3 11/21/2019 at 2100  . furosemide (LASIX) 80 MG tablet Take 80 mg by mouth 2 (two) times daily.   11/22/2019 at 1700  . glucose blood test strip Test blood sugar 3 times daily before meals 100 each 12   . HYDROcodone-acetaminophen (NORCO/VICODIN) 5-325 MG tablet Take 1 tablet by mouth every 12 (twelve) hours as needed for moderate pain. 30 tablet 0 11/22/2019 at 1000  . insulin aspart (NOVOLOG FLEXPEN) 100 UNIT/ML FlexPen INJECT 10 UNITS INTO THE SKIN 3 (THREE) TIMES DAILY WITH MEALS. ADJUST DOSE AS NEEDED 45 mL 1 11/22/2019 at 1700  . insulin detemir (LEVEMIR) 100 UNIT/ML injection Inject 0.28 mLs (28 Units total) into the skin at bedtime. Increase Levemir by 2 units when the week average is >200. 10 mL 3 11/21/2019 at 2000  . Insulin Pen Needle (ADVOCATE INSULIN PEN NEEDLES) 31G X 5 MM MISC 1 Package by Does not apply route 4 (four) times daily. 100 each 2   . lactulose (CHRONULAC) 10 GM/15ML solution Take 15 mLs (10 g total) by mouth 2 (two) times daily. 473 mL 3 11/22/2019 at 1700  . Lancets MISC 1 Stick by Does not apply route 3 (three) times daily with meals. 200 each 3   . levETIRAcetam (KEPPRA) 1000 MG tablet Take 1 tablet (1,000 mg total) by mouth 2 (two) times daily. 180 tablet 3 11/22/2019 at 1700  .  pantoprazole (PROTONIX) 40 MG tablet Take 1 tablet (40 mg total) by mouth daily. 90 tablet 3 11/22/2019 at 0800  . QUEtiapine (SEROQUEL) 100 MG tablet TAKE 1 TABLETS IN THE EVENING 135 tablet 3 11/21/2019 at 2100  . QUEtiapine (SEROQUEL) 25 MG tablet Take 1 tablet in AM 90 tablet 3 11/22/2019 at 0800  . spironolactone (ALDACTONE) 50 MG tablet Take 50 mg by mouth daily.   11/22/2019 at 0800  . warfarin (COUMADIN) 4 MG tablet Take 1 tablet (4 mg total) by mouth daily. 90 tablet 3 11/22/2019 at 1700  . albuterol (VENTOLIN HFA) 108 (90 Base) MCG/ACT inhaler Inhale 2 puffs into the lungs every 4 (four) hours as needed for wheezing or shortness of breath. 18 g 2 unknown PRN at unknown  . memantine (NAMENDA) 5 MG tablet Take 5 mg by mouth 2 (two) times daily.      Scheduled:  . sodium chloride   Intravenous Once  . sodium chloride   Intravenous Once  . atorvastatin  10 mg Oral Daily  . escitalopram  10 mg Oral QHS  . HYDROcodone-acetaminophen  1 tablet Oral Q8H  . insulin aspart  0-9 Units Subcutaneous TID WC  . lactulose  10 g Oral BID  . levETIRAcetam  1,000 mg Oral BID  . memantine  5 mg Oral BID  . pantoprazole  40 mg Oral Daily  . QUEtiapine  100 mg Oral QHS  . QUEtiapine  25 mg Oral Daily  . spironolactone  50 mg Oral Daily  . Warfarin - Pharmacist Dosing Inpatient   Does not apply q1600    Assessment: Patient with chronic warfarin for mechanical valve. Patient with acute blood loss anemia secondary to GI bleed.  GI cleared patient to restart warfarin/heparin bridge.   INR 2.0- pending follow-up INR from lab hgb 6.9> 8.0  (2 units PRBC)  Goal of Therapy:  HL 0.3-0.7 INR 2-3 Monitor platelets by anticoagulation protocol: Yes   Plan:  Start heparin infusion @ 2200 Initiate heparin infusion at 1500 units/hr Warfarin 4  mg x 1 dose. Heparin level in 6 hours and daily Monitor CBC and s/s of bleeding  Margot Ables, PharmD Clinical Pharmacist 11/25/2019 3:05 PM

## 2019-11-25 NOTE — Progress Notes (Addendum)
[facility]                                                                              Patient Demographics  Adam Savage, is a 69 y.o. male, DOB - 1950-06-26, ERX:[id number]  Admit date - 11/22/2019   Admitting Physician Adam Dike, MD  Outpatient Primary MD for the patient is Adam Anis, MD  Outpatient specialists:   LOS - 1  days   Medical records reviewed and are as summarized below:    Chief Complaint  Patient presents with  . Chest Pain       Brief summary   Adam Reidis an 69 y.o.malewith PMH significant for DM 2, HL, AVR on Coumadin, dementia status post lobectomy of brain, multiple recent falls, history of GI bleed who now presents with chest pain. History is provided by patient as well as his daughter.  Patient states that "it hurts in my heart". Patient states that he initially came in for back pain but then developed pain in the middle of his chest. Notes he has had pain like this before but not recently. Denies any exertional component to it. Notes "it radiates all over". No shortness of breath. No DOE. Patient's daughter states that he had previously been complaining about "pain in my rib" after he fell. However this morning he complained of "pain in my heart".   Patient's daughter states he is never complained of heart pain" before although he has had rib fractures in the past and had complained about rib pain.  Hemoglobin 7.7 in the ED, down from 8.3 two weeks ago.  Assessment & Plan    Principal Problem: Atypical chest pain -Cardiac enzymes negative.  Patient has been seen by cardiology.  Chronically anticoagulated, hence probability of VTE low.  Chest x-ray did not show any evidence of rib fractures. -Cardiology following, 2D echo showed EF of 65 to 70%, no regional wall motion abnormalities -Currently undergoing GI work-up for acute blood loss anemia, Coumadin on hold   Active Problems: Acute blood loss anemia  secondary to GI bleed -Hemoglobin down to 6.9 today, transfuse 2 units packed RBCs. 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  Small clot overlying eroded mucosa felt to be developed by lesion, ablated with APC. -Follow CBC in a.m. -Will need to resume Coumadin with heparin drip for mechanical valve, once cleared by GI Addendum Discussed with Dr. Laural Savage, recommended to resume Coumadin tonight, start heparin bridge at least 8 hours after the procedure or AM     COPD (chronic obstructive pulmonary disease) (HCC) -No evidence of wheezing, continue bronchodilators as needed  Dementia -Currently pleasant and cooperative, continue Namenda, Seroquel and Lexapro  Seizure disorder Continue Keppra    S/P AVR (aortic valve replacement) on chronic anticoagulation -Coumadin currently on hold due to acute blood loss anemia. -Once cleared by GI, will place back on Coumadin with heparin bridge  Generalized weakness with history of falls -Status post PT evaluation,  recommended home health PT  GERD -Continue PPI   Morbid obesity Estimated body mass index is 40.9 kg/m as calculated from the following:   Height as of this encounter: 6' (1.829 m).   Weight as of this encounter: 136.8 kg.  -Diet and weight control recommended  Code Status: Partial DVT Prophylaxis: Coumadin currently on hold Family Communication: Discussed all imaging results, lab results, explained to the patient   Disposition Plan:     Status is: Inpatient  Remains inpatient appropriate because:Inpatient level of care appropriate due to severity of illness   Dispo: The patient is from: ALF              Anticipated d/c is to: ALF              Anticipated d/c date is: 2 days              Patient currently is not  medically stable to d/c.  Ongoing GI work-up, will need heparin bridge/Lovenox with Coumadin due to aortic mechanical valve      Time Spent in minutes   35 minutes  Procedures:  Colonoscopy  Consultants:   Cardiology GI  Antimicrobials:   Anti-infectives (From admission, onward)   None          Medications  Scheduled Meds: . sodium chloride   Intravenous Once  . sodium chloride   Intravenous Once  . atorvastatin  10 mg Oral Daily  . escitalopram  10 mg Oral QHS  . HYDROcodone-acetaminophen  1 tablet Oral Q8H  . insulin aspart  0-9 Units Subcutaneous TID WC  . lactulose  10 g Oral BID  . levETIRAcetam  1,000 mg Oral BID  . memantine  5 mg Oral BID  . pantoprazole  40 mg Oral Daily  . QUEtiapine  100 mg Oral QHS  . QUEtiapine  25 mg Oral Daily  . spironolactone  50 mg Oral Daily  . Warfarin - Pharmacist Dosing Inpatient   Does not apply q1600   Continuous Infusions: . ferric gluconate (FERRLECIT/NULECIT) IV Stopped (11/23/19 1531)   PRN Meds:.acetaminophen, albuterol, ondansetron (ZOFRAN) IV      Subjective:   Adam Savage was seen and examined today.  Pleasant and cooperative, states overnight had bloody bowel movements, was having a bowel prep for colonoscopy today.  No acute chest pain or shortness of breath.  No dizziness.  Patient denies  new weakness, numbess, tingling. No acute events overnight.    Objective:   Vitals:   11/25/19 1151 11/25/19 1200 11/25/19 1215 11/25/19 1303  BP: 93/64 127/66 126/62 (!) 118/59  Pulse: 76 81 80 82  Resp: 16 18 18 19   Temp: 98.3 F (36.8 C)  98.2 F (36.8 C) 97.8 F (36.6 C)  TempSrc:    Oral  SpO2: 98% 98%  100%  Weight:      Height:        Intake/Output Summary (Last 24 hours) at 11/25/2019 1446 Last data filed at 11/25/2019 1303 Gross per 24 hour  Intake 1141 ml  Output --  Net 1141 ml     Wt Readings from Last 3 Encounters:  11/23/19 (!) 136.8 kg  11/11/19 (!) 138.8 kg  11/07/19 (!) 140.2 kg      Exam  General: Alert and oriented x 3, NAD  Cardiovascular: S1 S2 auscultated, no murmurs, RRR  Respiratory: Clear to auscultation bilaterally, no wheezing, rales or rhonchi  Gastrointestinal: Soft, nontender, nondistended, + bowel sounds  Ext: no pedal edema bilaterally  Neuro: Strength 5/5 upper and lower extremities bilaterally  Musculoskeletal: No digital cyanosis, clubbing  Skin: No rashes  Psych: Normal affect and demeanor, alert and oriented x3    Data Reviewed:  I have personally reviewed following labs and imaging studies  Micro Results Recent Results (from the past 240 hour(s))  SARS Coronavirus 2 by RT PCR (hospital order, performed in [facility] hospital lab) Nasopharyngeal Nasopharyngeal Swab     Status: None   Collection Time: 11/22/19  9:50 PM   Specimen: Nasopharyngeal Swab  Result Value Ref Range Status   SARS Coronavirus 2 NEGATIVE NEGATIVE Final    Comment: (NOTE) SARS-CoV-2 target nucleic acids are NOT DETECTED.  The SARS-CoV-2 RNA is generally detectable in upper and lower respiratory specimens during the acute phase of infection. The lowest concentration of SARS-CoV-2 viral copies this assay can detect is 250 copies / mL. A negative result does not preclude SARS-CoV-2 infection and should not be used as the sole basis for treatment or other patient management decisions.  A negative result may occur with improper specimen collection / handling, submission of specimen other than nasopharyngeal swab, presence of viral mutation(s) within the areas targeted by this assay, and inadequate number of viral copies (<250 copies / mL). A negative result must be combined with clinical observations, patient history, and epidemiological information.  Fact Sheet for Patients:   StrictlyIdeas.no  Fact Sheet for Healthcare Providers: BankingDealers.co.za  This test is not yet approved or  cleared by the  [place] FDA and has been authorized for detection and/or diagnosis of SARS-CoV-2 by FDA under an Emergency Use Authorization (EUA).  This EUA will remain in effect (meaning this test can be used) for the duration of the COVID-19 declaration under Section 564(b)(1) of the Act, 21 U.S.C. section 360bbb-3(b)(1), unless the authorization is terminated or revoked sooner.  Performed at [facility], [address] [place], [place] [address]     Radiology Reports DG Chest [place] 1 View  Result Date: 11/22/2019 CLINICAL DATA:  Chest pain EXAM: PORTABLE CHEST 1 VIEW COMPARISON:  01/17/2018 FINDINGS: Prior median sternotomy and valve replacement. Cardiomegaly. No edema or effusions. Bibasilar opacities could reflect atelectasis or scarring. IMPRESSION: Cardiomegaly.  Bibasilar atelectasis or scarring. Electronically Signed   By: Rolm Baptise M.D.   On: 11/22/2019 20:45   ECHOCARDIOGRAM COMPLETE  Result Date: 11/24/2019    ECHOCARDIOGRAM REPORT   Patient Name:   REGINO FOURNET Date of Exam: 11/24/2019 Medical Rec #:  [id number]    Height:       72.0 in Accession #:    [id number]   Weight:       301.6 lb Date of Birth:  1950-06-05     BSA:          2.537 m Patient Age:    72 years     BP:           103/46 mmHg Patient Gender: M            HR:           66 bpm. Exam Location:  [facility] Procedure: 2D Echo Indications:    Chest Pain 786.50 / R07.9  History:        Patient has prior history of Echocardiogram examinations, most                 recent 10/29/2017. CHF, COPD; Risk Factors:Diabetes and Former  Smoker. AVR 2002 [employer] Mechanical aortic valve.  Sonographer:    Leavy Cella RDCS (AE) Referring Phys: [id number] [place]  1. Left ventricular ejection fraction, by estimation, is 65 to 70%. The left ventricle has normal function. The left ventricle has no regional wall motion abnormalities. There is moderate left ventricular hypertrophy. Left ventricular diastolic  parameters were normal.  2. Right ventricular systolic function is normal. The right ventricular size is normal. There is normal pulmonary artery systolic pressure.  3. The mitral valve is normal in structure. No evidence of mitral valve regurgitation. No evidence of mitral stenosis.  4. History of aortic valve replacement, unknown type     . The aortic valve has been repaired/replaced. Aortic valve regurgitation is not visualized. Aortic valve mean gradient measures 21.8 mmHg.  5. The inferior vena cava is normal in size with greater than 50% respiratory variability, suggesting right atrial pressure of 3 mmHg. FINDINGS  Left Ventricle: History of aortic valve replacement, unknown valve type. Left ventricular ejection fraction, by estimation, is 65 to 70%. The left ventricle has normal function. The left ventricle has no regional wall motion abnormalities. The left ventricular internal cavity size was normal in size. There is moderate left ventricular hypertrophy. Left ventricular diastolic parameters were normal. Right Ventricle: The right ventricular size is normal. No increase in right ventricular wall thickness. Right ventricular systolic function is normal. There is normal pulmonary artery systolic pressure. The tricuspid regurgitant velocity is 2.31 m/s, and  with an assumed right atrial pressure of 10 mmHg, the estimated right ventricular systolic pressure is 31.5 mmHg. Left Atrium: Left atrial size was normal in size. Right Atrium: Right atrial size was normal in size. Pericardium: There is no evidence of pericardial effusion. Mitral Valve: The mitral valve is normal in structure. There is mild thickening of the mitral valve leaflet(s). There is mild calcification of the mitral valve leaflet(s). Mild mitral annular calcification. No evidence of mitral valve regurgitation. No evidence of mitral valve stenosis. Tricuspid Valve: The tricuspid valve is normal in structure. Tricuspid valve regurgitation is  trivial. No evidence of tricuspid stenosis. Aortic Valve: History of aortic valve replacement, unknown type. The aortic valve has been repaired/replaced.. There is mild thickening and mild calcification of the aortic valve. Aortic valve regurgitation is not visualized. Mild aortic valve annular calcification. There is mild thickening of the aortic valve. There is mild calcification of the aortic valve. Aortic valve mean gradient measures 21.8 mmHg. Aortic valve peak gradient measures 40.0 mmHg. Aortic valve area, by VTI measures 1.22 cm. Pulmonic Valve: The pulmonic valve was not well visualized. Pulmonic valve regurgitation is not visualized. No evidence of pulmonic stenosis. Aorta: The aortic root is normal in size and structure. Pulmonary Artery: Indeterminant PASP, inadequate TR jet. Venous: The inferior vena cava is normal in size with greater than 50% respiratory variability, suggesting right atrial pressure of 3 mmHg. IAS/Shunts: The interatrial septum was not well visualized.  LEFT VENTRICLE PLAX 2D LVIDd:         4.89 cm  Diastology LVIDs:         2.78 cm  LV e' lateral:   13.80 cm/s LV PW:         1.45 cm  LV E/e' lateral: 7.9 LV IVS:        1.53 cm  LV e' medial:    7.83 cm/s LVOT diam:     2.00 cm  LV E/e' medial:  13.9 LV SV:  79 LV SV Index:   31 LVOT Area:     3.14 cm  RIGHT VENTRICLE RV S prime:     11.00 cm/s TAPSE (M-mode): 1.5 cm LEFT ATRIUM             Index       RIGHT ATRIUM           Index LA diam:        3.80 cm 1.50 cm/m  RA Area:     12.30 cm LA Vol (A2C):   82.7 ml 32.60 ml/m RA Volume:   22.60 ml  8.91 ml/m LA Vol (A4C):   62.1 ml 24.48 ml/m LA Biplane Vol: 77.3 ml 30.47 ml/m  AORTIC VALVE AV Area (Vmax):    1.15 cm AV Area (Vmean):   1.04 cm AV Area (VTI):     1.22 cm AV Vmax:           316.33 cm/s AV Vmean:          218.927 cm/s AV VTI:            0.648 m AV Peak Grad:      40.0 mmHg AV Mean Grad:      21.8 mmHg LVOT Vmax:         115.71 cm/s LVOT Vmean:        72.264  cm/s LVOT VTI:          0.252 m LVOT/AV VTI ratio: 0.39  AORTA Ao Root diam: 3.40 cm MITRAL VALVE                TRICUSPID VALVE MV Area (PHT): 3.83 cm     TR Peak grad:   21.3 mmHg MV Decel Time: 198 msec     TR Vmax:        231.00 cm/s MV E velocity: 109.00 cm/s MV A velocity: 108.00 cm/s  SHUNTS MV E/A ratio:  1.01         Systemic VTI:  0.25 m                             Systemic Diam: 2.00 cm Carlyle Dolly MD Electronically signed by Carlyle Dolly MD Signature Date/Time: 11/24/2019/12:01:35 PM    Final    US ABDOMEN LIMITED RUQ  Result Date: 11/18/2019 CLINICAL DATA:  Hepatic cirrhosis. EXAM: ULTRASOUND ABDOMEN LIMITED RIGHT UPPER QUADRANT COMPARISON:  March 29, 2019. FINDINGS: Gallbladder: Status post cholecystectomy. Common bile duct: Diameter: 5 mm which is within normal limits. Liver: Heterogeneous echotexture is noted with slightly nodular contours suggesting hepatic cirrhosis. Stable 1 cm right hepatic cyst is noted. Portal vein is patent on color Doppler imaging with normal direction of blood flow towards the liver. Other: None. IMPRESSION: Status post cholecystectomy. Findings consistent with hepatic cirrhosis. Stable small right hepatic cyst. Electronically Signed   By: Marijo Conception M.D.   On: 11/18/2019 15:37    Lab Data:  CBC: Recent Labs  Lab 11/22/19 2013 11/22/19 2013 11/23/19 0527 11/23/19 1737 11/24/19 0435 11/24/19 1112 11/24/19 1429 11/24/19 1954 11/25/19 0557  WBC 5.7  --  6.4  --   --   --   --   --  5.9  NEUTROABS 4.1  --   --   --   --   --   --   --   --   HGB 7.7*   < > 8.0*   < > 7.7* 7.8* 8.1* 7.7* 6.9*  HCT 27.1*   < > 28.4*   < > 27.3* 27.1* 28.3* 26.9* 23.6*  MCV 73.6*  --  73.4*  --   --   --   --   --  75.2*  PLT 108*  --  107*  --   --   --   --   --  101*   < > = values in this interval not displayed.   Basic Metabolic Panel: Recent Labs  Lab 11/22/19 2013 11/23/19 0527 11/25/19 0557  NA 132* 136 136  K 3.4* 3.5 3.7  CL 96* 100 99    CO2 28 25 28   GLUCOSE 271* 143* 140*  BUN 22 22 18   CREATININE 1.56* 1.35* 1.20  CALCIUM 8.3* 8.4* 8.6*   GFR: Estimated Creatinine Clearance: 83.2 mL/min (by C-G formula based on SCr of 1.2 mg/dL). Liver Function Tests: Recent Labs  Lab 11/22/19 2013  AST 28  ALT 19  ALKPHOS 162*  BILITOT 1.0  PROT 7.0  ALBUMIN 3.2*   No results for input(s): LIPASE, AMYLASE in the last 168 hours. No results for input(s): AMMONIA in the last 168 hours. Coagulation Profile: Recent Labs  Lab 11/22/19 0000 11/22/19 2013 11/23/19 0527 11/24/19 0435 11/25/19 0557  INR 2.1 2.5* 2.7* 3.2* 2.0*   Cardiac Enzymes: No results for input(s): CKTOTAL, CKMB, CKMBINDEX, TROPONINI in the last 168 hours. BNP (last 3 results) No results for input(s): PROBNP in the last 8760 hours. HbA1C: Recent Labs    11/22/19 2215  HGBA1C 7.7*   CBG: Recent Labs  Lab 11/24/19 0827 11/24/19 1156 11/24/19 1644 11/25/19 0842 11/25/19 1153  GLUCAP 152* 159* 156* 161* 161*   Lipid Profile: Recent Labs    11/23/19 0527  CHOL 118  HDL 44  LDLCALC 63  TRIG 55  CHOLHDL 2.7   Thyroid Function Tests: No results for input(s): TSH, T4TOTAL, FREET4, T3FREE, THYROIDAB in the last 72 hours. Anemia Panel: Recent Labs    11/22/19 2013  VITAMINB12 1,088*  FOLATE 17.8  FERRITIN 15*  TIBC 480*  IRON 18*  RETICCTPCT 2.3   Urine analysis:    Component Value Date/Time   COLORURINE YELLOW 12/21/2017 2120   APPEARANCEUR HAZY (A) 12/21/2017 2120   LABSPEC 1.028 12/21/2017 2120   PHURINE 5.0 12/21/2017 2120   GLUCOSEU 150 (A) 12/21/2017 2120   HGBUR LARGE (A) 12/21/2017 2120   BILIRUBINUR NEGATIVE 12/21/2017 2120   [place] NEGATIVE 12/21/2017 2120   PROTEINUR 30 (A) 12/21/2017 2120   NITRITE NEGATIVE 12/21/2017 2120   LEUKOCYTESUR NEGATIVE 12/21/2017 2120     Loletta Harper M.D. [facility] 11/25/2019, 2:46 PM   Call night coverage person covering after 7pm

## 2019-11-25 NOTE — Anesthesia Postprocedure Evaluation (Signed)
Anesthesia Post Note  Patient: Adam Savage  Procedure(s) Performed: COLONOSCOPY WITH PROPOFOL (N/A ) POLYPECTOMY  Patient location during evaluation: PACU Anesthesia Type: General Level of consciousness: awake and patient cooperative Pain management: satisfactory to patient Vital Signs Assessment: post-procedure vital signs reviewed and stable Respiratory status: spontaneous breathing, respiratory function stable, nonlabored ventilation and patient connected to nasal cannula oxygen Cardiovascular status: stable Postop Assessment: no apparent nausea or vomiting Anesthetic complications: no   No complications documented.   Last Vitals:  Vitals:   11/25/19 1020 11/25/19 1044  BP: 92/79 (!) 113/58  Pulse: 82 84  Resp: 18 13  Temp: 36.6 C (!) 36.4 C  SpO2: 100%     Last Pain:  Vitals:   11/25/19 1044  TempSrc: Oral  PainSc:                  [name]

## 2019-11-25 NOTE — Progress Notes (Signed)
Nurse called due to H/H 6.9 this morning.  Order was placed for a repeat H&H, type and screen and 2 units of blood reserved.  Blood transfusion pending approval by day hospitalist

## 2019-11-25 NOTE — Op Note (Signed)
[facility] Patient Name: Adam Savage Procedure Date: 11/25/2019 10:51 AM MRN: [id number] Date of Birth: 04-23-1950 Attending MD: Hildred Laser , MD CSN: [id number] Age: 69 Admit Type: Inpatient Procedure:                Colonoscopy Indications:              Rectal bleeding Providers:                Hildred Laser, MD, Janeece Riggers, RN, Lambert Mody Referring MD:             Kathie Dike, MD Medicines:                Propofol per Anesthesia Complications:            No immediate complications. Estimated Blood Loss:     Estimated blood loss was minimal. Procedure:                Pre-Anesthesia Assessment:                           - Prior to the procedure, a History and Physical                            was performed, and patient medications and                            allergies were reviewed. The patient's tolerance of                            previous anesthesia was also reviewed. The risks                            and benefits of the procedure and the sedation                            options and risks were discussed with the patient.                            All questions were answered, and informed consent                            was obtained. Prior Anticoagulants: The patient                            last took Coumadin (warfarin) 3 days prior to the                            procedure. ASA Grade Assessment: III - A patient                            with severe systemic disease. After reviewing the  risks and benefits, the patient was deemed in                            satisfactory condition to undergo the procedure.                           After obtaining informed consent, the colonoscope                            was passed under direct vision. Throughout the                            procedure, the patient's blood pressure, pulse, and                            oxygen saturations were  monitored continuously. The                            [id number]-[id number] ([id number]) scope was introduced through                            the anus and advanced to the the terminal ileum,                            with identification of the appendiceal orifice and                            IC valve. The colonoscopy was performed without                            difficulty. The patient tolerated the procedure                            well. The quality of the bowel preparation was                            excellent. Scope In: 11:15:28 AM Scope Out: 11:43:07 AM Scope Withdrawal Time: 0 hours 24 minutes 3 seconds  Total Procedure Duration: 0 hours 27 minutes 39 seconds  Findings:      The perianal and digital rectal examinations were normal.      The terminal ileum appeared normal.      A single small angiodysplastic lesion without bleeding was found in the       cecum.      Two polyps were found in the descending colon. The polyps were 6 to 7 mm       in size. These polyps were removed with a jumbo cold forceps. Resection       and retrieval were complete. To stop active bleeding, two hemostatic       clips were successfully placed (MR conditional). There was no bleeding       at the end of the procedure. The pathology specimen was placed into       Bottle Number 1.      Small mucosal abnormality with a clot felt to be a Dieulafoy lesion.  Ablated with APC.      There was evidence of a prior end-to-end colo-colonic anastomosis at 23       cm proximal to the anus. This was patent and was characterized by       healthy appearing mucosa.      External hemorrhoids were found during retroflexion. The hemorrhoids       were small. Impression:               - The examined portion of the ileum was normal.                           - A single non-bleeding colonic angiodysplastic                            lesion.                           - Two 6 to 7 mm polyps in the descending colon,                             removed with a jumbo cold forceps. Resected and                            retrieved. Clips (MR conditional) were placed.                           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                            Ablated with APC.                           - Patent end-to-end colo-colonic anastomosis,                            characterized by healthy appearing mucosa.                           - External hemorrhoids. Moderate Sedation:      Per Anesthesia Care Recommendation:           - Return patient to hospital ward for ongoing care.                           - Diabetic (ADA) diet today.                           - Continue present medications.                           - Resume Coumadin (warfarin) at prior dose today.                           - Await pathology results.                           - No recommendation at this time regarding  repeat                            colonoscopy. Procedure Code(s):        --- Professional ---                           [phone], Colonoscopy, flexible; with control of                            bleeding, any method Diagnosis Code(s):        --- Professional ---                           K64.4, Residual hemorrhoidal skin tags                           K55.20, Angiodysplasia of colon without hemorrhage                           K63.5, Polyp of colon                           Z98.0, Intestinal bypass and anastomosis status                           K62.5, Hemorrhage of anus and rectum CPT copyright 2019 [employer]. All rights reserved. The codes documented in this report are preliminary and upon coder review may  be revised to meet current compliance requirements. Hildred Laser, MD Hildred Laser, MD 11/25/2019 12:04:36 PM This report has been signed electronically. Number of Addenda: 0

## 2019-11-25 NOTE — OR Nursing (Signed)
At anastomosis at 1117. At 23 cm

## 2019-11-26 DIAGNOSIS — R079 Chest pain, unspecified: Secondary | ICD-10-CM | POA: Diagnosis not present

## 2019-11-26 LAB — TYPE AND SCREEN
ABO/RH(D): A POS
Antibody Screen: NEGATIVE
Unit division: 0
Unit division: 0

## 2019-11-26 LAB — BASIC METABOLIC PANEL
Anion gap: 11 (ref 5–15)
BUN: 14 mg/dL (ref 8–23)
CO2: 25 mmol/L (ref 22–32)
Calcium: 8.6 mg/dL — ABNORMAL LOW (ref 8.9–10.3)
Chloride: 98 mmol/L (ref 98–111)
Creatinine, Ser: 1.22 mg/dL (ref 0.61–1.24)
GFR calc Af Amer: >60 mL/min (ref >60)
GFR calc non Af Amer: >60 mL/min (ref >60)
Glucose, Bld: 149 mg/dL — ABNORMAL HIGH (ref 70–99)
Potassium: 3.5 mmol/L (ref 3.5–5.1)
Sodium: 134 mmol/L — ABNORMAL LOW (ref 135–145)

## 2019-11-26 LAB — HEPARIN LEVEL (UNFRACTIONATED)
Heparin Unfractionated: 0.24 IU/mL — ABNORMAL LOW (ref 0.30–0.70)
Heparin Unfractionated: 0.32 IU/mL (ref 0.30–0.70)

## 2019-11-26 LAB — BPAM RBC
Blood Product Expiration Date: 202108132359
Blood Product Expiration Date: 202108232359
ISSUE DATE / TIME: 202108120955
ISSUE DATE / TIME: 202108121521
Unit Type and Rh: 600
Unit Type and Rh: 6200

## 2019-11-26 LAB — PROTIME-INR
INR: 1.8 — ABNORMAL HIGH (ref 0.8–1.2)
Prothrombin Time: 20.5 seconds — ABNORMAL HIGH (ref 11.4–15.2)

## 2019-11-26 LAB — GLUCOSE, CAPILLARY
Glucose-Capillary: 110 mg/dL — ABNORMAL HIGH (ref 70–99)
Glucose-Capillary: 171 mg/dL — ABNORMAL HIGH (ref 70–99)
Glucose-Capillary: 232 mg/dL — ABNORMAL HIGH (ref 70–99)
Glucose-Capillary: 234 mg/dL — ABNORMAL HIGH (ref 70–99)

## 2019-11-26 MED ORDER — WARFARIN SODIUM 7.5 MG PO TABS
7.5000 mg | ORAL_TABLET | Freq: Once | ORAL | Status: AC
  Administered 2019-11-26: 7.5 mg via ORAL
  Filled 2019-11-26: qty 1

## 2019-11-26 NOTE — TOC Progression Note (Signed)
Transition of Care [facility]) - Progression Note   Patient Details  Name: Adam Savage MRN: [id number] Date of Birth: 01-Jul-1950  Transition of [facility]) CM/SW [name], LCSW Phone Number: 11/26/2019, 1:37 PM  Clinical Narrative: CSW called [facility] and spoke with Janett Billow. Per Janett Billow, patient is already active with [facility]. Janett Billow stated the patient's daughter, [name], may be able to transport him back at discharge, but if not, the facility can provide transportation. CSW spoke with Melissa at [facility] to inform her of the patient's likely weekend discharge so that [facility] services can be resumed. FL2 started. TOC to follow.  Expected Discharge Plan: [place] Barriers to Discharge: Continued Medical Work up  Expected Discharge Plan and Services Expected Discharge Plan: [place] In-house Referral: Clinical Social Work Discharge Planning Services: [place] Post Acute Care Choice: [place] arrangements for the past 2 months: [place] Expected Discharge Date: 11/23/19               DME Arranged: N/A DME Agency: NA HH Arranged: PT HH Agency: [place] Date [place]: 11/23/19 Time [place]: 1528 Representative spoke with at [place]: [place]  Readmission Risk Interventions No flowsheet data found.

## 2019-11-26 NOTE — Progress Notes (Signed)
ANTICOAGULATION CONSULT NOTE -   Pharmacy Consult for Warfarin Indication: Mechanical Valve   Patient Measurements: Height: 6' (182.9 cm) Weight: (!) 136.8 kg (301 lb 9.4 oz) IBW/kg (Calculated) : 77.6 Heparin Dosing Weight: 109 kg  Vital Signs: Temp: 98.8 F (37.1 C) (08/13 1320) Temp Source: Oral (08/13 1320) BP: 139/51 (08/13 1320) Pulse Rate: 86 (08/13 1320)  Labs: Recent Labs    11/24/19 0435 11/24/19 1954 11/25/19 0557 11/25/19 1455 11/25/19 1840 11/26/19 0849 11/26/19 1544  HGB   < > 7.7* 6.9* 8.0*  --   --   --   HCT  --  26.9* 23.6* 27.8*  --   --   --   PLT  --   --  101*  --   --   --   --   LABPROT   < >  --  22.1*  --  20.3* 20.5*  --   INR   < >  --  2.0*  --  1.8* 1.8*  --   HEPARINUNFRC  --   --   --   --   --  0.24* 0.32  CREATININE  --   --  1.20  --   --  1.22  --    < > = values in this interval not displayed.    Estimated Creatinine Clearance: 81.9 mL/min (by C-G formula based on SCr of 1.22 mg/dL).   Assessment: Patient with chronic warfarin for mechanical valve. Patient with acute blood loss anemia secondary to GI bleed.  GI cleared patient to restart warfarin/heparin bridge.  11/26/19 1045 update: Heparin level: 0.24 IU/mL, below goal range on heparin at 1500 units/hr INR 1.8:  below goal range CBC: Hb 6.9> 8.0  (after 2 units PRBCs) RN reports no bleeding complications or issues with infusion site  11/26/19 1625 update: Heparin level: 0.32 IU/mL, in goal range on heparin at 1500 units/hr  Goal of Therapy:  HL 0.3-0.7 INR 2-3 Monitor platelets by anticoagulation protocol: Yes   Plan:  Continue heparin infusion rate at 1600 units/hr Give warfarin  7.5mg  x 1 dose (53% boost dose for low INR) Re-check heparin level daily Monitor CBC and s/s of bleeding  Thomasenia Sales, PharmD, MBA, BCGP Clinical Pharmacist  11/26/2019 4:25 PM

## 2019-11-26 NOTE — Progress Notes (Signed)
[facility]                                                                              Patient Demographics  Adam Savage, is a 69 y.o. male, DOB - Mar 21, 1951, STM:[id number]  Admit date - 11/22/2019   Admitting Physician Kathie Dike, MD  Outpatient Primary MD for the patient is [name], MD  Outpatient specialists:   LOS - 2  days   Medical records reviewed and are as summarized below:    Chief Complaint  Patient presents with  . Chest Pain       Brief summary   Adam Reidis an 69 y.o.malewith PMH significant for DM 2, HL, AVR on Coumadin, dementia status post lobectomy of brain, multiple recent falls, history of GI bleed who now presents with chest pain.  Patient has been cleared by the cardiology team.  Patient has also undergone colonoscopy.  Colonoscopy done on 11/25/2019 revealed: -The examined portion of the ileum was normal. - A single non-bleeding colonic angiodysplastic lesion. - Two 6 to 7 mm polyps in the descending colon, removed with a jumbo cold forceps. Resected and retrieved. Clips (MR conditional) were placed. 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 Ablated with APC. - Patent end-to-end colo-colonic anastomosis, characterized by healthy appearing mucosa. - External hemorrhoids.  Patient is currently on heparin and Coumadin.  INR is 1.8 today.  Will patient will be discharged once INR is optimized.  Assessment & Plan    Principal Problem: Atypical chest pain -Cardiac enzymes negative.  Patient has been seen by cardiology.  Chronically anticoagulated, hence probability of VTE low.  Chest x-ray did not show any evidence of rib fractures. -Cardiology following, 2D echo showed EF of 65 to 70%, no regional wall motion abnormalities -Currently undergoing GI work-up for acute blood loss anemia, Coumadin on hold 11/26/2019: Cleared for discharge by cardiology.  Patient is currently on heparin drip and Coumadin.  Chest pain is  deemed atypical.   Acute blood loss anemia secondary to GI bleed -Hemoglobin down to 6.9 today, transfuse 2 units packed RBCs. 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  Small clot overlying eroded mucosa felt to be developed by lesion, ablated with APC. -Follow CBC in a.m. -Will need to resume Coumadin with heparin drip for mechanical valve, once cleared by GI Addendum Discussed with Dr. Laural Golden, recommended to resume Coumadin tonight, start heparin bridge at least 8 hours after the procedure or AM  11/26/2019: Patient underwent colonoscopy (kindly see above).  Last hemoglobin were 8 g/dL.  Will check CBC tomorrow.    COPD (chronic obstructive pulmonary disease) (HCC) -No evidence of wheezing, continue bronchodilators as needed 11/26/2019: Stable.  Dementia -Currently pleasant and cooperative, continue Namenda, Seroquel and Lexapro 11/26/2019: No behavioral problems.  Seizure disorder Continue Keppra    S/P AVR (aortic valve replacement) on chronic anticoagulation -Coumadin currently on hold due to acute blood loss anemia. -Once cleared by GI, will place back  on Coumadin with heparin bridge 11/26/2019: Patient is currently on heparin drip and Coumadin.  INR is 1.8 today.  Generalized weakness with history of falls -Status post PT evaluation, recommended home health PT  GERD -Continue PPI   Morbid obesity Estimated body mass index is 40.9 kg/m as calculated from the following:   Height as of this encounter: 6' (1.829 m).   Weight as of this encounter: 136.8 kg.  -Diet and weight control recommended  Code Status: Partial DVT Prophylaxis: Coumadin currently on hold Family Communication: Discussed all imaging results, lab results, explained to the patient    Disposition Plan:     Status is: Inpatient  Remains inpatient appropriate because:Inpatient level of care appropriate due to severity of illness   Dispo: The patient is from: ALF              Anticipated d/c is to: ALF              Anticipated d/c date is: 2 days              Patient currently is not medically stable to d/c.  Ongoing GI work-up, will need heparin bridge/Lovenox with Coumadin due to aortic mechanical valve. 11/26/2019: Patient will be discharged once INR is optimized.      Time Spent in minutes   35 minutes  Procedures:  Colonoscopy  Consultants:   Cardiology GI  Antimicrobials:   Anti-infectives (From admission, onward)   None         Medications  Scheduled Meds: . sodium chloride   Intravenous Once  . sodium chloride   Intravenous Once  . atorvastatin  10 mg Oral Daily  . escitalopram  10 mg Oral QHS  . HYDROcodone-acetaminophen  1 tablet Oral Q8H  . insulin aspart  0-9 Units Subcutaneous TID WC  . lactulose  10 g Oral BID  . levETIRAcetam  1,000 mg Oral BID  . memantine  5 mg Oral BID  . pantoprazole  40 mg Oral Daily  . QUEtiapine  100 mg Oral QHS  . QUEtiapine  25 mg Oral Daily  . spironolactone  50 mg Oral Daily  . warfarin  7.5 mg Oral ONCE-1600  . Warfarin - Pharmacist Dosing Inpatient   Does not apply q1600   Continuous Infusions: . heparin 1,600 Units/hr (11/26/19 0959)   PRN Meds:.acetaminophen, albuterol, ondansetron (ZOFRAN) IV      Subjective:  Patient is a poor historian. No new complaints.  Objective:   Vitals:   11/25/19 2001 11/25/19 2027 11/26/19 0424 11/26/19 1320  BP:  (!) 120/46 (!) 122/59 (!) 139/51  Pulse:  82 72 86  Resp:  16 16   Temp:  97.7 F (36.5 C) 98.3 F (36.8 C) 98.8 F (37.1 C)  TempSrc:  Oral Oral Oral  SpO2: 96% 99% 100% 97%  Weight:      Height:        Intake/Output Summary (Last 24 hours) at 11/26/2019 1653 Last data filed at 11/26/2019 1500 Gross per 24 hour  Intake 847.02 ml   Output 950 ml  Net -102.98 ml     Wt Readings from Last 3 Encounters:  11/23/19 (!) 136.8 kg  11/11/19 (!) 138.8 kg  11/07/19 (!) 140.2 kg     Exam  General: Awake and alert.  Patient is morbidly obese.  Patient is not in any obvious distress.    Cardiovascular: S1 S2 auscultated  Respiratory: Clear to auscultation bilaterally.  Gastrointestinal: Morbidly obese, soft and nontender.  Organs are difficult to assess.    Ext: no pedal edema bilaterally  Neuro: Currently alert.  Patient moves all extremities.    Data Reviewed:  I have personally reviewed following labs and imaging studies  Micro Results Recent Results (from the past 240 hour(s))  SARS Coronavirus 2 by RT PCR (hospital order, performed in [facility] hospital lab) Nasopharyngeal Nasopharyngeal Swab     Status: None   Collection Time: 11/22/19  9:50 PM   Specimen: Nasopharyngeal Swab  Result Value Ref Range Status   SARS Coronavirus 2 NEGATIVE NEGATIVE Final    Comment: (NOTE) SARS-CoV-2 target nucleic acids are NOT DETECTED.  The SARS-CoV-2 RNA is generally detectable in upper and lower respiratory specimens during the acute phase of infection. The lowest concentration of SARS-CoV-2 viral copies this assay can detect is 250 copies / mL. A negative result does not preclude SARS-CoV-2 infection and should not be used as the sole basis for treatment or other patient management decisions.  A negative result may occur with improper specimen collection / handling, submission of specimen other than nasopharyngeal swab, presence of viral mutation(s) within the areas targeted by this assay, and inadequate number of viral copies (<250 copies / mL). A negative result must be combined with clinical observations, patient history, and epidemiological information.  Fact Sheet for Patients:   StrictlyIdeas.no  Fact Sheet for Healthcare Providers: BankingDealers.co.za   This test is not yet approved or  cleared by the [place] FDA and has been authorized for detection and/or diagnosis of SARS-CoV-2 by FDA under an Emergency Use Authorization (EUA).  This EUA will remain in effect (meaning this test can be used) for the duration of the COVID-19 declaration under Section 564(b)(1) of the Act, 21 U.S.C. section 360bbb-3(b)(1), unless the authorization is terminated or revoked sooner.  Performed at [facility], [address] [place], [place] [address]     Radiology Reports DG Chest [place] 1 View  Result Date: 11/22/2019 CLINICAL DATA:  Chest pain EXAM: PORTABLE CHEST 1 VIEW COMPARISON:  01/17/2018 FINDINGS: Prior median sternotomy and valve replacement. Cardiomegaly. No edema or effusions. Bibasilar opacities could reflect atelectasis or scarring. IMPRESSION: Cardiomegaly.  Bibasilar atelectasis or scarring. Electronically Signed   By: Rolm Baptise M.D.   On: 11/22/2019 20:45   ECHOCARDIOGRAM COMPLETE  Result Date: 11/24/2019    ECHOCARDIOGRAM REPORT   Patient Name:   SKYELAR SWIGART Date of Exam: 11/24/2019 Medical Rec #:  [id number]    Height:       72.0 in Accession #:    [id number]   Weight:       301.6 lb Date of Birth:  [birth year]     BSA:          2.537 m Patient Age:    3 years     BP:           103/46 mmHg Patient Gender: M            HR:           66 bpm. Exam Location:  [facility] Procedure: 2D Echo Indications:    Chest Pain 786.50 / R07.9  History:        Patient has prior history of Echocardiogram examinations, most                 recent 10/29/2017. CHF, COPD; Risk Factors:Diabetes and Former                 Smoker. AVR 2002 [employer]  Mechanical aortic valve.  Sonographer:    Leavy Cella RDCS (AE) Referring Phys: [id number] [place]  1. Left ventricular ejection fraction, by estimation, is 65 to 70%. The left ventricle has normal function. The left ventricle has no regional wall motion abnormalities. There is moderate left  ventricular hypertrophy. Left ventricular diastolic parameters were normal.  2. Right ventricular systolic function is normal. The right ventricular size is normal. There is normal pulmonary artery systolic pressure.  3. The mitral valve is normal in structure. No evidence of mitral valve regurgitation. No evidence of mitral stenosis.  4. History of aortic valve replacement, unknown type     . The aortic valve has been repaired/replaced. Aortic valve regurgitation is not visualized. Aortic valve mean gradient measures 21.8 mmHg.  5. The inferior vena cava is normal in size with greater than 50% respiratory variability, suggesting right atrial pressure of 3 mmHg. FINDINGS  Left Ventricle: History of aortic valve replacement, unknown valve type. Left ventricular ejection fraction, by estimation, is 65 to 70%. The left ventricle has normal function. The left ventricle has no regional wall motion abnormalities. The left ventricular internal cavity size was normal in size. There is moderate left ventricular hypertrophy. Left ventricular diastolic parameters were normal. Right Ventricle: The right ventricular size is normal. No increase in right ventricular wall thickness. Right ventricular systolic function is normal. There is normal pulmonary artery systolic pressure. The tricuspid regurgitant velocity is 2.31 m/s, and  with an assumed right atrial pressure of 10 mmHg, the estimated right ventricular systolic pressure is 17.6 mmHg. Left Atrium: Left atrial size was normal in size. Right Atrium: Right atrial size was normal in size. Pericardium: There is no evidence of pericardial effusion. Mitral Valve: The mitral valve is normal in structure. There is mild thickening of the mitral valve leaflet(s). There is mild calcification of the mitral valve leaflet(s). Mild mitral annular calcification. No evidence of mitral valve regurgitation. No evidence of mitral valve stenosis. Tricuspid Valve: The tricuspid valve is normal  in structure. Tricuspid valve regurgitation is trivial. No evidence of tricuspid stenosis. Aortic Valve: History of aortic valve replacement, unknown type. The aortic valve has been repaired/replaced.. There is mild thickening and mild calcification of the aortic valve. Aortic valve regurgitation is not visualized. Mild aortic valve annular calcification. There is mild thickening of the aortic valve. There is mild calcification of the aortic valve. Aortic valve mean gradient measures 21.8 mmHg. Aortic valve peak gradient measures 40.0 mmHg. Aortic valve area, by VTI measures 1.22 cm. Pulmonic Valve: The pulmonic valve was not well visualized. Pulmonic valve regurgitation is not visualized. No evidence of pulmonic stenosis. Aorta: The aortic root is normal in size and structure. Pulmonary Artery: Indeterminant PASP, inadequate TR jet. Venous: The inferior vena cava is normal in size with greater than 50% respiratory variability, suggesting right atrial pressure of 3 mmHg. IAS/Shunts: The interatrial septum was not well visualized.  LEFT VENTRICLE PLAX 2D LVIDd:         4.89 cm  Diastology LVIDs:         2.78 cm  LV e' lateral:   13.80 cm/s LV PW:         1.45 cm  LV E/e' lateral: 7.9 LV IVS:        1.53 cm  LV e' medial:    7.83 cm/s LVOT diam:     2.00 cm  LV E/e' medial:  13.9 LV SV:         79  LV SV Index:   31 LVOT Area:     3.14 cm  RIGHT VENTRICLE RV S prime:     11.00 cm/s TAPSE (M-mode): 1.5 cm LEFT ATRIUM             Index       RIGHT ATRIUM           Index LA diam:        3.80 cm 1.50 cm/m  RA Area:     12.30 cm LA Vol (A2C):   82.7 ml 32.60 ml/m RA Volume:   22.60 ml  8.91 ml/m LA Vol (A4C):   62.1 ml 24.48 ml/m LA Biplane Vol: 77.3 ml 30.47 ml/m  AORTIC VALVE AV Area (Vmax):    1.15 cm AV Area (Vmean):   1.04 cm AV Area (VTI):     1.22 cm AV Vmax:           316.33 cm/s AV Vmean:          218.927 cm/s AV VTI:            0.648 m AV Peak Grad:      40.0 mmHg AV Mean Grad:      21.8 mmHg LVOT Vmax:          115.71 cm/s LVOT Vmean:        72.264 cm/s LVOT VTI:          0.252 m LVOT/AV VTI ratio: 0.39  AORTA Ao Root diam: 3.40 cm MITRAL VALVE                TRICUSPID VALVE MV Area (PHT): 3.83 cm     TR Peak grad:   21.3 mmHg MV Decel Time: 198 msec     TR Vmax:        231.00 cm/s MV E velocity: 109.00 cm/s MV A velocity: 108.00 cm/s  SHUNTS MV E/A ratio:  1.01         Systemic VTI:  0.25 m                             Systemic Diam: 2.00 cm Carlyle Dolly MD Electronically signed by Carlyle Dolly MD Signature Date/Time: 11/24/2019/12:01:35 PM    Final    US ABDOMEN LIMITED RUQ  Result Date: 11/18/2019 CLINICAL DATA:  Hepatic cirrhosis. EXAM: ULTRASOUND ABDOMEN LIMITED RIGHT UPPER QUADRANT COMPARISON:  March 29, 2019. FINDINGS: Gallbladder: Status post cholecystectomy. Common bile duct: Diameter: 5 mm which is within normal limits. Liver: Heterogeneous echotexture is noted with slightly nodular contours suggesting hepatic cirrhosis. Stable 1 cm right hepatic cyst is noted. Portal vein is patent on color Doppler imaging with normal direction of blood flow towards the liver. Other: None. IMPRESSION: Status post cholecystectomy. Findings consistent with hepatic cirrhosis. Stable small right hepatic cyst. Electronically Signed   By: Marijo Conception M.D.   On: 11/18/2019 15:37    Lab Data:  CBC: Recent Labs  Lab 11/22/19 2013 11/22/19 2013 11/23/19 0527 11/23/19 1737 11/24/19 1112 11/24/19 1429 11/24/19 1954 11/25/19 0557 11/25/19 1455  WBC 5.7  --  6.4  --   --   --   --  5.9  --   NEUTROABS 4.1  --   --   --   --   --   --   --   --   HGB 7.7*   < > 8.0*   < > 7.8* 8.1* 7.7* 6.9* 8.0*  HCT 27.1*   < > 28.4*   < > 27.1* 28.3* 26.9* 23.6* 27.8*  MCV 73.6*  --  73.4*  --   --   --   --  75.2*  --   PLT 108*  --  107*  --   --   --   --  101*  --    < > = values in this interval not displayed.   Basic Metabolic Panel: Recent Labs  Lab 11/22/19 2013 11/23/19 0527 11/25/19 0557  11/26/19 0849  NA 132* 136 136 134*  K 3.4* 3.5 3.7 3.5  CL 96* 100 99 98  CO2 28 25 28 25   GLUCOSE 271* 143* 140* 149*  BUN 22 22 18 14   CREATININE 1.56* 1.35* 1.20 1.22  CALCIUM 8.3* 8.4* 8.6* 8.6*   GFR: Estimated Creatinine Clearance: 81.9 mL/min (by C-G formula based on SCr of 1.22 mg/dL). Liver Function Tests: Recent Labs  Lab 11/22/19 2013  AST 28  ALT 19  ALKPHOS 162*  BILITOT 1.0  PROT 7.0  ALBUMIN 3.2*   No results for input(s): LIPASE, AMYLASE in the last 168 hours. No results for input(s): AMMONIA in the last 168 hours. Coagulation Profile: Recent Labs  Lab 11/23/19 0527 11/24/19 0435 11/25/19 0557 11/25/19 1840 11/26/19 0849  INR 2.7* 3.2* 2.0* 1.8* 1.8*   Cardiac Enzymes: No results for input(s): CKTOTAL, CKMB, CKMBINDEX, TROPONINI in the last 168 hours. BNP (last 3 results) No results for input(s): PROBNP in the last 8760 hours. HbA1C: No results for input(s): HGBA1C in the last 72 hours. CBG: Recent Labs  Lab 11/25/19 1153 11/25/19 1644 11/25/19 2022 11/26/19 0821 11/26/19 1232  GLUCAP 161* 170* 219* 110* 171*   Lipid Profile: No results for input(s): CHOL, HDL, LDLCALC, TRIG, CHOLHDL, LDLDIRECT in the last 72 hours. Thyroid Function Tests: No results for input(s): TSH, T4TOTAL, FREET4, T3FREE, THYROIDAB in the last 72 hours. Anemia Panel: No results for input(s): VITAMINB12, FOLATE, FERRITIN, TIBC, IRON, RETICCTPCT in the last 72 hours. Urine analysis:    Component Value Date/Time   COLORURINE YELLOW 12/21/2017 2120   APPEARANCEUR HAZY (A) 12/21/2017 2120   LABSPEC 1.028 12/21/2017 2120   PHURINE 5.0 12/21/2017 2120   GLUCOSEU 150 (A) 12/21/2017 2120   HGBUR LARGE (A) 12/21/2017 2120   BILIRUBINUR NEGATIVE 12/21/2017 2120   [place] NEGATIVE 12/21/2017 2120   PROTEINUR 30 (A) 12/21/2017 2120   NITRITE NEGATIVE 12/21/2017 2120   LEUKOCYTESUR NEGATIVE 12/21/2017 2120     Bonnell Public M.D. [facility] 11/26/2019,  4:53 PM   Call night coverage person covering after 7pm

## 2019-11-26 NOTE — Progress Notes (Signed)
ANTICOAGULATION CONSULT NOTE -   Pharmacy Consult for Warfarin Indication: Mechanical Valve   Patient Measurements: Height: 6' (182.9 cm) Weight: (!) 136.8 kg (301 lb 9.4 oz) IBW/kg (Calculated) : 77.6 Heparin Dosing Weight: 109 kg  Vital Signs: Temp: 98.3 F (36.8 C) (08/13 0424) Temp Source: Oral (08/13 0424) BP: 122/59 (08/13 0424) Pulse Rate: 72 (08/13 0424)  Labs: Recent Labs    11/23/19 1047 11/23/19 1737 11/24/19 0435 11/24/19 1954 11/25/19 0557 11/25/19 1455 11/25/19 1840 11/26/19 0849  HGB  --    < >   < > 7.7* 6.9* 8.0*  --   --   HCT  --    < >  --  26.9* 23.6* 27.8*  --   --   PLT  --   --   --   --  101*  --   --   --   LABPROT  --    < >   < >  --  22.1*  --  20.3* 20.5*  INR  --    < >   < >  --  2.0*  --  1.8* 1.8*  HEPARINUNFRC  --   --   --   --   --   --   --  0.24*  CREATININE  --   --   --   --  1.20  --   --  1.22  TROPONINIHS 7  --   --   --   --   --   --   --    < > = values in this interval not displayed.    Estimated Creatinine Clearance: 81.9 mL/min (by C-G formula based on SCr of 1.22 mg/dL).   Assessment: Patient with chronic warfarin for mechanical valve. Patient with acute blood loss anemia secondary to GI bleed.  GI cleared patient to restart warfarin/heparin bridge.  11/26/19 1045 update: Heparin level: 0.24 IU/mL, below goal range on heparin at 1500 units/hr INR 1.8:  below goal range CBC: Hb 6.9> 8.0  (after 2 units PRBCs) RN reports no bleeding complications or issues with infusion site   Goal of Therapy:  HL 0.3-0.7 INR 2-3 Monitor platelets by anticoagulation protocol: Yes   Plan:   Increase heparin infusion rate to 1600 units/hr Give warfarin  7.5mg  x 1 dose (53% boost dose for low INR) Re-check heparin level in 6-8 hours and daily Monitor CBC and s/s of bleeding  Despina Pole, Pharm. D. Clinical Pharmacist 11/26/2019 10:46 AM

## 2019-11-26 NOTE — Progress Notes (Addendum)
Physical Therapy Treatment Patient Details Name: Adam Savage MRN: [id number] DOB: 1950/05/08 Today's Date: 11/26/2019    History of Present Illness 69 y.o. 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     PT Comments    The patient was able to complete therapeutic exercises without increased dizziness or back pain. He was able to achieve modified independence with bed mobility today with slow labored movements. With the assistance of a RW, the patient was able to perform sit to stand and ambulation with only min guard/ min assist, although he required increased time to successfully complete turns without LOB. After 65 feet of ambulation the patient was able to transfer to the commode with min guard assist to have a successful bowel movement. Patient tolerated sitting up in chair with call bell within arm's reach after therapy. PLAN: The patient will continue to benefit from skilled physical therapy services in hospital at recommended venue below in order to improve balance, gait, and ADL's to promote independence in functional activities.     Follow Up Recommendations  Home health PT     Equipment Recommendations  None recommended by PT    Recommendations for Other Services   None recommended by PT.      Precautions / Restrictions Precautions Precautions: Fall Precaution Booklet Issued: No Precaution Comments: L hip ORIF, DVT proflaxis and WBAT Restrictions Weight Bearing Restrictions: No    Mobility  Bed Mobility Overal bed mobility: Modified Independent             General bed mobility comments: was able to transfer to commode to have bowel movement  Transfers Overall transfer level: Needs assistance Equipment used: Rolling walker (2 wheeled) Transfers: Sit to/from Stand Sit to Stand: Min guard             Ambulation/Gait Ambulation/Gait assistance: Min Gaffer (Feet): 65 Feet Assistive device: Rolling walker (2 wheeled) Gait Pattern/deviations: Step-through pattern;Decreased stride length;Wide base of support   General Gait Details: wide BOS, slightly unsteady with turns and took extra time to complete them but no physical assistance or near falls   Stairs             Wheelchair Mobility    Modified Rankin (Stroke Patients Only)       Balance Overall balance assessment: Mild deficits observed, not formally tested         Standing balance support: No upper extremity supported;During functional activity Standing balance-Leahy Scale: Good Standing balance comment: RW                            Cognition Arousal/Alertness: Awake/alert Behavior During Therapy: WFL for tasks assessed/performed Overall Cognitive Status: History of cognitive impairments - at baseline                                 General Comments: Pt reports he has a hard time remembering things and that he has dementia; required slow rate of speech for understanding      Exercises General Exercises - Lower Extremity Long Arc Quad: AROM;Seated;Strengthening;Both;10 reps Hip Flexion/Marching: AROM;Seated;Strengthening;Both;10 reps Toe Raises: AROM;Seated;Strengthening;Both;10 reps Heel Raises: Seated;AROM;Strengthening;Both;10 reps    General Comments        Pertinent Vitals/Pain Pain Assessment: Faces Pain Score: 6  Faces  Pain Scale: Hurts a little bit Pain Location: low back Pain Descriptors / Indicators: Aching;Sore Pain Intervention(s): Limited activity within patient's tolerance;Monitored during session;Repositioned    Home Living                      Prior Function            PT Goals (current goals can now be found in the care plan section) Acute Rehab PT Goals Patient Stated Goal: return home PT Goal Formulation: With  patient Time For Goal Achievement: 11/30/19 Potential to Achieve Goals: Good Progress towards PT goals: Progressing toward goals    Frequency    Min 2X/week      PT Plan Current plan remains appropriate    Co-evaluation              AM-PAC PT "6 Clicks" Mobility   Outcome Measure  Help needed turning from your back to your side while in a flat bed without using bedrails?: None Help needed moving from lying on your back to sitting on the side of a flat bed without using bedrails?: None Help needed moving to and from a bed to a chair (including a wheelchair)?: A Little Help needed standing up from a chair using your arms (e.g., wheelchair or bedside chair)?: A Little Help needed to walk in hospital room?: A Little Help needed climbing 3-5 steps with a railing? : A Lot 6 Click Score: 19    End of Session Equipment Utilized During Treatment: Gait belt Activity Tolerance: Patient tolerated treatment well Patient left: in chair;with call bell/phone within reach;with chair alarm set Nurse Communication: Mobility status PT Visit Diagnosis: Unsteadiness on feet (R26.81);Other abnormalities of gait and mobility (R26.89);Repeated falls (R29.6)     Time: 4665-9935 PT Time Calculation (min) (ACUTE ONLY): 25 min  Charges:  $Therapeutic Exercise: 8-22 mins $Therapeutic Activity: 8-22 mins                     1:31 PM , 11/26/19 Karlyn Agee, SPT Physical Therapy with [facility] [phone] office     This qualified practitioner was present in the room guiding the student in service delivery. Therapy student was participating in the provision of services, and the practitioner was not engaged in treating another patient or doing other tasks at the same time. 1:31 PM, 11/26/19 Mearl Latin PT, DPT Physical Therapist at [facility]

## 2019-11-27 DIAGNOSIS — K625 Hemorrhage of anus and rectum: Secondary | ICD-10-CM

## 2019-11-27 DIAGNOSIS — R079 Chest pain, unspecified: Secondary | ICD-10-CM | POA: Diagnosis not present

## 2019-11-27 LAB — GLUCOSE, CAPILLARY
Glucose-Capillary: 126 mg/dL — ABNORMAL HIGH (ref 70–99)
Glucose-Capillary: 158 mg/dL — ABNORMAL HIGH (ref 70–99)
Glucose-Capillary: 212 mg/dL — ABNORMAL HIGH (ref 70–99)
Glucose-Capillary: 210 mg/dL — ABNORMAL HIGH (ref 70–99)

## 2019-11-27 LAB — CBC
HCT: 29.1 % — ABNORMAL LOW (ref 39.0–52.0)
Hemoglobin: 8.5 g/dL — ABNORMAL LOW (ref 13.0–17.0)
MCH: 22.9 pg — ABNORMAL LOW (ref 26.0–34.0)
MCHC: 29.2 g/dL — ABNORMAL LOW (ref 30.0–36.0)
MCV: 78.4 fL — ABNORMAL LOW (ref 80.0–100.0)
Platelets: 99 10*3/uL — ABNORMAL LOW (ref 150–400)
RBC: 3.71 MIL/uL — ABNORMAL LOW (ref 4.22–5.81)
RDW: 22.5 % — ABNORMAL HIGH (ref 11.5–15.5)
WBC: 7 10*3/uL (ref 4.0–10.5)
nRBC: 0 % (ref 0.0–0.2)

## 2019-11-27 LAB — BASIC METABOLIC PANEL
Anion gap: 9 (ref 5–15)
BUN: 14 mg/dL (ref 8–23)
CO2: 26 mmol/L (ref 22–32)
Calcium: 8.5 mg/dL — ABNORMAL LOW (ref 8.9–10.3)
Chloride: 101 mmol/L (ref 98–111)
Creatinine, Ser: 1.14 mg/dL (ref 0.61–1.24)
GFR calc Af Amer: >60 mL/min (ref >60)
GFR calc non Af Amer: >60 mL/min (ref >60)
Glucose, Bld: 141 mg/dL — ABNORMAL HIGH (ref 70–99)
Potassium: 3.5 mmol/L (ref 3.5–5.1)
Sodium: 136 mmol/L (ref 135–145)

## 2019-11-27 LAB — HEPARIN LEVEL (UNFRACTIONATED): Heparin Unfractionated: 0.39 IU/mL (ref 0.30–0.70)

## 2019-11-27 LAB — PROTIME-INR
INR: 1.8 — ABNORMAL HIGH (ref 0.8–1.2)
Prothrombin Time: 19.9 seconds — ABNORMAL HIGH (ref 11.4–15.2)

## 2019-11-27 MED ORDER — WARFARIN SODIUM 5 MG PO TABS
10.0000 mg | ORAL_TABLET | Freq: Once | ORAL | Status: AC
  Administered 2019-11-27: 10 mg via ORAL
  Filled 2019-11-27: qty 2

## 2019-11-27 NOTE — Progress Notes (Signed)
No complaints other than wanting to go home. Tolerating advanced diet.  Denies rectal bleeding   Vital signs in last 24 hours: Temp:  [98 F (36.7 C)-98.8 F (37.1 C)] 98.2 F (36.8 C) (08/14 0528) Pulse Rate:  [84-86] 84 (08/14 0528) Resp:  [18-20] 18 (08/14 0528) BP: (108-139)/(51-66) 108/56 (08/14 0528) SpO2:  [97 %-98 %] 97 % (08/14 0528) Last BM Date: 11/25/19 General:   Alert,   pleasant and cooperative in NAD Abdomen:  Soft, nontender and nondistended.  Normal bowel sounds, without guarding, and without rebound.  No mass or organomegaly.   Intake/Output from previous day: 08/13 0701 - 08/14 0700 In: 140 [I.V.:140] Out: 200 [Urine:200] Intake/Output this shift: No intake/output data recorded.  Lab Results: Recent Labs    11/25/19 0557 11/25/19 1455 11/27/19 0755  WBC 5.9  --  7.0  HGB 6.9* 8.0* 8.5*  HCT 23.6* 27.8* 29.1*  PLT 101*  --  99*   BMET Recent Labs    11/25/19 0557 11/26/19 0849 11/27/19 0755  NA 136 134* 136  K 3.7 3.5 3.5  CL 99 98 101  CO2 28 25 26   GLUCOSE 140* 149* 141*  BUN 18 14 14   CREATININE 1.20 1.22 1.14  CALCIUM 8.6* 8.6* 8.5*   LFT No results for input(s): PROT, ALBUMIN, AST, ALT, ALKPHOS, BILITOT, BILIDIR, IBILI in the last 72 hours. PT/INR Recent Labs    11/26/19 0849 11/27/19 0755  LABPROT 20.5* 19.9*  INR 1.8* 1.8*    Impression: 69 year old gentleman chronically anticoagulated presenting with hematochezia.  Colonoscopy revealed AVM Dieulafoy/  -  S/P treatment along with polypectomy.  No further bleeding.  Hemoglobin stable  Recommendations: Per hospitalist, discharge when INR therapeutic.  Follow-up with Dr. Laural Golden to be scheduled

## 2019-11-27 NOTE — Progress Notes (Signed)
ANTICOAGULATION CONSULT NOTE -   Pharmacy Consult for Warfarin Indication: Mechanical Valve   Patient Measurements: Height: 6' (182.9 cm) Weight: (!) 136.8 kg (301 lb 9.4 oz) IBW/kg (Calculated) : 77.6 Heparin Dosing Weight: 109 kg  Vital Signs: Temp: 98.2 F (36.8 C) (08/14 0528) BP: 108/56 (08/14 0528) Pulse Rate: 84 (08/14 0528)  Labs: Recent Labs    11/25/19 0557 11/25/19 0557 11/25/19 1455 11/25/19 1840 11/26/19 0849 11/26/19 1544 11/27/19 0755  HGB 6.9*   < > 8.0*  --   --   --  8.5*  HCT 23.6*  --  27.8*  --   --   --  29.1*  PLT 101*  --   --   --   --   --  99*  LABPROT 22.1*   < >  --  20.3* 20.5*  --  19.9*  INR 2.0*   < >  --  1.8* 1.8*  --  1.8*  HEPARINUNFRC  --   --   --   --  0.24* 0.32 0.39  CREATININE 1.20  --   --   --  1.22  --  1.14   < > = values in this interval not displayed.    Estimated Creatinine Clearance: 87.6 mL/min (by C-G formula based on SCr of 1.14 mg/dL).   Assessment: Patient with chronic warfarin for mechanical valve. Patient with acute blood loss anemia secondary to GI bleed.  GI cleared patient to restart warfarin/heparin bridge.  11/27/19 AM update: Heparin level: 0.39 IU/mL, now within goal range on heparin at 1600 units/hr INR 1.8:  below goal range CBC: Hb 6.9> 8.0>8.5     Plates 101>99  (hepatic cirrhosis d/t HCV) RN reports no bleeding complications or issues with infusion site    Goal of Therapy:  HL 0.3-0.7 INR 2-3 Monitor platelets by anticoagulation protocol: Yes   Plan:  Continue heparin infusion rate at 1600 units/hr Give  warfarin  10 mg x 1 dose tonight  Re-check heparin level daily Monitor CBC and s/s of bleeding   Despina Pole, Pharm. D. Clinical Pharmacist 11/27/2019 11:23 AM   11/27/2019 11:23 AM

## 2019-11-27 NOTE — Progress Notes (Signed)
[facility]                                                                              Patient Demographics  Adam Savage, is a 69 y.o. male, DOB - Sep 03, 1950, VOJ:[id number]  Admit date - 11/22/2019   Admitting Physician Adam Dike, MD  Outpatient Primary MD for the patient is Adam Anis, MD  Outpatient specialists:   LOS - 3  days   Medical records reviewed and are as summarized below:    Chief Complaint  Patient presents with  . Chest Pain       Brief summary   Patient is a 68 year old Caucasian male, morbidly obese, with past medical history significant for diabetes mellitus type 2, hyperlipidemia, status post aortic valve replacement on Coumadin, dementia, status post lobectomy of brain, multiple recent falls and history of GI bleed.  Patient was admitted with chest pain, deemed atypical by the cardiology team and has been cleared by cardiology team for discharge.  Patient has also undergone colonoscopy.    Colonoscopy done on 11/25/2019 revealed: -The examined portion of the ileum was normal. - A single non-bleeding colonic angiodysplastic lesion. - Two 6 to 7 mm polyps in the descending colon, removed with a jumbo cold forceps. Resected and retrieved. Clips (MR conditional) were placed. 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 Ablated with APC. - Patent end-to-end colo-colonic anastomosis, characterized by healthy appearing mucosa. - External hemorrhoids.  Patient is currently on heparin and Coumadin.  INR is 1.8 today.  Will patient will be discharged once INR is optimized.  Patient's daughter, Adam Savage, was updated.  Assessment & Plan    Principal Problem: Atypical chest pain -Cardiac enzymes negative.   -Patient was seen and cleared for discharge by the cardiology team.   -2D echo revealed EF of 65 to 70%, no regional wall motion abnormalities -GI work-up for acute blood loss anemia done, currently see above for colonoscopy  report. -Coumadin was initially on hold, but none recent.  Patient is also on heparin bridge. INR today is 1.8.  Acute blood loss anemia secondary to GI bleed -Hemoglobin was down to 6.9 today, transfused 2 units packed RBCs. -Received 2 units FFP on 8/11, for elevated INR, 3.2 -Hemoglobin has remained stable.  Hemoglobin today 8.5 g/dL.    COPD (chronic obstructive pulmonary disease) (HCC) -No evidence of wheezing, continue bronchodilators as needed 11/26/2019: Stable.  Dementia -Currently pleasant and cooperative, continue Namenda, Seroquel and Lexapro 11/26/2019: No behavioral problems.  Seizure disorder Continue Keppra  S/P AVR (aortic valve replacement) on chronic anticoagulation -Currently on heparin bridge and Coumadin. -INR today is 1.8.   -Patient will be discharged once INR is optimized.  Generalized weakness with history of falls -Status post PT evaluation, recommended home health PT  GERD -Continue PPI   Morbid obesity Estimated body mass index is 40.9 kg/m as calculated from the following:   Height as of this encounter: 6' (1.829 m).   Weight as of this encounter: 136.8 kg.  -Diet and weight control recommended  Code Status: Partial DVT Prophylaxis: Coumadin currently on hold Family Communication: Discussed all imaging results, lab results,  explained to the patient   Disposition Plan:     Status is: Inpatient  Remains inpatient appropriate because:Inpatient level of care appropriate due to severity of illness   Dispo: The patient is from: ALF              Anticipated d/c is to: ALF              Anticipated d/c date is: 2 days              Patient currently is not medically stable to d/c.  Ongoing GI work-up, will need heparin bridge/Lovenox with Coumadin due to aortic mechanical valve. 11/26/2019: Patient will be discharged once INR is optimized.      Time Spent in minutes   25 minutes  Procedures:  Colonoscopy  Consultants:    Cardiology GI  Antimicrobials:   Anti-infectives (From admission, onward)   None         Medications  Scheduled Meds: . sodium chloride   Intravenous Once  . sodium chloride   Intravenous Once  . atorvastatin  10 mg Oral Daily  . escitalopram  10 mg Oral QHS  . HYDROcodone-acetaminophen  1 tablet Oral Q8H  . insulin aspart  0-9 Units Subcutaneous TID WC  . lactulose  10 g Oral BID  . levETIRAcetam  1,000 mg Oral BID  . memantine  5 mg Oral BID  . pantoprazole  40 mg Oral Daily  . QUEtiapine  100 mg Oral QHS  . QUEtiapine  25 mg Oral Daily  . spironolactone  50 mg Oral Daily  . warfarin  10 mg Oral ONCE-1600  . Warfarin - Pharmacist Dosing Inpatient   Does not apply q1600   Continuous Infusions: . heparin 1,600 Units/hr (11/27/19 1354)   PRN Meds:.acetaminophen, albuterol, ondansetron (ZOFRAN) IV      Subjective:  Patient is a poor historian. No new complaints.  Objective:   Vitals:   11/26/19 1320 11/26/19 2022 11/27/19 0528 11/27/19 1326  BP: (!) 139/51 116/66 (!) 108/56 108/67  Pulse: 86 85 84 79  Resp:  20 18 18   Temp: 98.8 F (37.1 C) 98 F (36.7 C) 98.2 F (36.8 C) (!) 97.5 F (36.4 C)  TempSrc: Oral Oral  Oral  SpO2: 97% 98% 97% 99%  Weight:      Height:        Intake/Output Summary (Last 24 hours) at 11/27/2019 1524 Last data filed at 11/27/2019 1253 Gross per 24 hour  Intake --  Output 300 ml  Net -300 ml     Wt Readings from Last 3 Encounters:  11/23/19 (!) 136.8 kg  11/11/19 (!) 138.8 kg  11/07/19 (!) 140.2 kg     Exam  General: Awake and alert.  Patient is morbidly obese.  Patient is not in any obvious distress.    Cardiovascular: S1 S2 auscultated  Respiratory: Clear to auscultation bilaterally.  Gastrointestinal: Morbidly obese, soft and nontender.  Organs are difficult to assess.    Ext: no pedal edema bilaterally  Neuro: Currently alert.  Patient moves all extremities.    Data Reviewed:  I have personally  reviewed following labs and imaging studies  Micro Results Recent Results (from the past 240 hour(s))  SARS Coronavirus 2 by RT PCR (hospital order, performed in [facility] hospital lab) Nasopharyngeal Nasopharyngeal Swab     Status: None   Collection Time: 11/22/19  9:50 PM   Specimen: Nasopharyngeal Swab  Result Value Ref Range Status   SARS Coronavirus 2  NEGATIVE NEGATIVE Final    Comment: (NOTE) SARS-CoV-2 target nucleic acids are NOT DETECTED.  The SARS-CoV-2 RNA is generally detectable in upper and lower respiratory specimens during the acute phase of infection. The lowest concentration of SARS-CoV-2 viral copies this assay can detect is 250 copies / mL. A negative result does not preclude SARS-CoV-2 infection and should not be used as the sole basis for treatment or other patient management decisions.  A negative result may occur with improper specimen collection / handling, submission of specimen other than nasopharyngeal swab, presence of viral mutation(s) within the areas targeted by this assay, and inadequate number of viral copies (<250 copies / mL). A negative result must be combined with clinical observations, patient history, and epidemiological information.  Fact Sheet for Patients:   StrictlyIdeas.no  Fact Sheet for Healthcare Providers: BankingDealers.co.za  This test is not yet approved or  cleared by the [place] FDA and has been authorized for detection and/or diagnosis of SARS-CoV-2 by FDA under an Emergency Use Authorization (EUA).  This EUA will remain in effect (meaning this test can be used) for the duration of the COVID-19 declaration under Section 564(b)(1) of the Act, 21 U.S.C. section 360bbb-3(b)(1), unless the authorization is terminated or revoked sooner.  Performed at [facility], [address], [name], [place] [address]     Radiology Reports DG Chest [place] 1 View  Result Date:  11/22/2019 CLINICAL DATA:  Chest pain EXAM: PORTABLE CHEST 1 VIEW COMPARISON:  01/17/2018 FINDINGS: Prior median sternotomy and valve replacement. Cardiomegaly. No edema or effusions. Bibasilar opacities could reflect atelectasis or scarring. IMPRESSION: Cardiomegaly.  Bibasilar atelectasis or scarring. Electronically Signed   By: Rolm Baptise M.D.   On: 11/22/2019 20:45   ECHOCARDIOGRAM COMPLETE  Result Date: 11/24/2019    ECHOCARDIOGRAM REPORT   Patient Name:   Adam Savage Date of Exam: 11/24/2019 Medical Rec #:  [id number]    Height:       72.0 in Accession #:    [id number]   Weight:       301.6 lb Date of Birth:  1950-09-01     BSA:          2.537 m Patient Age:    22 years     BP:           103/46 mmHg Patient Gender: M            HR:           66 bpm. Exam Location:  [facility] Procedure: 2D Echo Indications:    Chest Pain 786.50 / R07.9  History:        Patient has prior history of Echocardiogram examinations, most                 recent 10/29/2017. CHF, COPD; Risk Factors:Diabetes and Former                 Smoker. AVR 2002 [employer] Mechanical aortic valve.  Sonographer:    Leavy Cella RDCS (AE) Referring Phys: [id number] [place]  1. Left ventricular ejection fraction, by estimation, is 65 to 70%. The left ventricle has normal function. The left ventricle has no regional wall motion abnormalities. There is moderate left ventricular hypertrophy. Left ventricular diastolic parameters were normal.  2. Right ventricular systolic function is normal. The right ventricular size is normal. There is normal pulmonary artery systolic pressure.  3. The mitral valve is normal in structure. No evidence of mitral valve regurgitation. No  evidence of mitral stenosis.  4. History of aortic valve replacement, unknown type     . The aortic valve has been repaired/replaced. Aortic valve regurgitation is not visualized. Aortic valve mean gradient measures 21.8 mmHg.  5. The inferior vena cava is normal in  size with greater than 50% respiratory variability, suggesting right atrial pressure of 3 mmHg. FINDINGS  Left Ventricle: History of aortic valve replacement, unknown valve type. Left ventricular ejection fraction, by estimation, is 65 to 70%. The left ventricle has normal function. The left ventricle has no regional wall motion abnormalities. The left ventricular internal cavity size was normal in size. There is moderate left ventricular hypertrophy. Left ventricular diastolic parameters were normal. Right Ventricle: The right ventricular size is normal. No increase in right ventricular wall thickness. Right ventricular systolic function is normal. There is normal pulmonary artery systolic pressure. The tricuspid regurgitant velocity is 2.31 m/s, and  with an assumed right atrial pressure of 10 mmHg, the estimated right ventricular systolic pressure is 15.4 mmHg. Left Atrium: Left atrial size was normal in size. Right Atrium: Right atrial size was normal in size. Pericardium: There is no evidence of pericardial effusion. Mitral Valve: The mitral valve is normal in structure. There is mild thickening of the mitral valve leaflet(s). There is mild calcification of the mitral valve leaflet(s). Mild mitral annular calcification. No evidence of mitral valve regurgitation. No evidence of mitral valve stenosis. Tricuspid Valve: The tricuspid valve is normal in structure. Tricuspid valve regurgitation is trivial. No evidence of tricuspid stenosis. Aortic Valve: History of aortic valve replacement, unknown type. The aortic valve has been repaired/replaced.. There is mild thickening and mild calcification of the aortic valve. Aortic valve regurgitation is not visualized. Mild aortic valve annular calcification. There is mild thickening of the aortic valve. There is mild calcification of the aortic valve. Aortic valve mean gradient measures 21.8 mmHg. Aortic valve peak gradient measures 40.0 mmHg. Aortic valve area, by VTI  measures 1.22 cm. Pulmonic Valve: The pulmonic valve was not well visualized. Pulmonic valve regurgitation is not visualized. No evidence of pulmonic stenosis. Aorta: The aortic root is normal in size and structure. Pulmonary Artery: Indeterminant PASP, inadequate TR jet. Venous: The inferior vena cava is normal in size with greater than 50% respiratory variability, suggesting right atrial pressure of 3 mmHg. IAS/Shunts: The interatrial septum was not well visualized.  LEFT VENTRICLE PLAX 2D LVIDd:         4.89 cm  Diastology LVIDs:         2.78 cm  LV e' lateral:   13.80 cm/s LV PW:         1.45 cm  LV E/e' lateral: 7.9 LV IVS:        1.53 cm  LV e' medial:    7.83 cm/s LVOT diam:     2.00 cm  LV E/e' medial:  13.9 LV SV:         79 LV SV Index:   31 LVOT Area:     3.14 cm  RIGHT VENTRICLE RV S prime:     11.00 cm/s TAPSE (M-mode): 1.5 cm LEFT ATRIUM             Index       RIGHT ATRIUM           Index LA diam:        3.80 cm 1.50 cm/m  RA Area:     12.30 cm LA Vol (A2C):   82.7 ml 32.60 ml/m  RA Volume:   22.60 ml  8.91 ml/m LA Vol (A4C):   62.1 ml 24.48 ml/m LA Biplane Vol: 77.3 ml 30.47 ml/m  AORTIC VALVE AV Area (Vmax):    1.15 cm AV Area (Vmean):   1.04 cm AV Area (VTI):     1.22 cm AV Vmax:           316.33 cm/s AV Vmean:          218.927 cm/s AV VTI:            0.648 m AV Peak Grad:      40.0 mmHg AV Mean Grad:      21.8 mmHg LVOT Vmax:         115.71 cm/s LVOT Vmean:        72.264 cm/s LVOT VTI:          0.252 m LVOT/AV VTI ratio: 0.39  AORTA Ao Root diam: 3.40 cm MITRAL VALVE                TRICUSPID VALVE MV Area (PHT): 3.83 cm     TR Peak grad:   21.3 mmHg MV Decel Time: 198 msec     TR Vmax:        231.00 cm/s MV E velocity: 109.00 cm/s MV A velocity: 108.00 cm/s  SHUNTS MV E/A ratio:  1.01         Systemic VTI:  0.25 m                             Systemic Diam: 2.00 cm Carlyle Dolly MD Electronically signed by Carlyle Dolly MD Signature Date/Time: 11/24/2019/12:01:35 PM    Final    US  ABDOMEN LIMITED RUQ  Result Date: 11/18/2019 CLINICAL DATA:  Hepatic cirrhosis. EXAM: ULTRASOUND ABDOMEN LIMITED RIGHT UPPER QUADRANT COMPARISON:  March 29, 2019. FINDINGS: Gallbladder: Status post cholecystectomy. Common bile duct: Diameter: 5 mm which is within normal limits. Liver: Heterogeneous echotexture is noted with slightly nodular contours suggesting hepatic cirrhosis. Stable 1 cm right hepatic cyst is noted. Portal vein is patent on color Doppler imaging with normal direction of blood flow towards the liver. Other: None. IMPRESSION: Status post cholecystectomy. Findings consistent with hepatic cirrhosis. Stable small right hepatic cyst. Electronically Signed   By: Marijo Conception M.D.   On: 11/18/2019 15:37    Lab Data:  CBC: Recent Labs  Lab 11/22/19 2013 11/22/19 2013 11/23/19 0527 11/23/19 1737 11/24/19 1429 11/24/19 1954 11/25/19 0557 11/25/19 1455 11/27/19 0755  WBC 5.7  --  6.4  --   --   --  5.9  --  7.0  NEUTROABS 4.1  --   --   --   --   --   --   --   --   HGB 7.7*   < > 8.0*   < > 8.1* 7.7* 6.9* 8.0* 8.5*  HCT 27.1*   < > 28.4*   < > 28.3* 26.9* 23.6* 27.8* 29.1*  MCV 73.6*  --  73.4*  --   --   --  75.2*  --  78.4*  PLT 108*  --  107*  --   --   --  101*  --  99*   < > = values in this interval not displayed.   Basic Metabolic Panel: Recent Labs  Lab 11/22/19 2013 11/23/19 0527 11/25/19 0557 11/26/19 0849 11/27/19 0755  NA 132* 136 136 134* 136  K  3.4* 3.5 3.7 3.5 3.5  CL 96* 100 99 98 101  CO2 28 25 28 25 26   GLUCOSE 271* 143* 140* 149* 141*  BUN 22 22 18 14 14   CREATININE 1.56* 1.35* 1.20 1.22 1.14  CALCIUM 8.3* 8.4* 8.6* 8.6* 8.5*   GFR: Estimated Creatinine Clearance: 87.6 mL/min (by C-G formula based on SCr of 1.14 mg/dL). Liver Function Tests: Recent Labs  Lab 11/22/19 2013  AST 28  ALT 19  ALKPHOS 162*  BILITOT 1.0  PROT 7.0  ALBUMIN 3.2*   No results for input(s): LIPASE, AMYLASE in the last 168 hours. No results for input(s):  AMMONIA in the last 168 hours. Coagulation Profile: Recent Labs  Lab 11/24/19 0435 11/25/19 0557 11/25/19 1840 11/26/19 0849 11/27/19 0755  INR 3.2* 2.0* 1.8* 1.8* 1.8*   Cardiac Enzymes: No results for input(s): CKTOTAL, CKMB, CKMBINDEX, TROPONINI in the last 168 hours. BNP (last 3 results) No results for input(s): PROBNP in the last 8760 hours. HbA1C: No results for input(s): HGBA1C in the last 72 hours. CBG: Recent Labs  Lab 11/26/19 1232 11/26/19 1701 11/26/19 2020 11/27/19 0732 11/27/19 1155  GLUCAP 171* 232* 234* 126* 158*   Lipid Profile: No results for input(s): CHOL, HDL, LDLCALC, TRIG, CHOLHDL, LDLDIRECT in the last 72 hours. Thyroid Function Tests: No results for input(s): TSH, T4TOTAL, FREET4, T3FREE, THYROIDAB in the last 72 hours. Anemia Panel: No results for input(s): VITAMINB12, FOLATE, FERRITIN, TIBC, IRON, RETICCTPCT in the last 72 hours. Urine analysis:    Component Value Date/Time   COLORURINE YELLOW 12/21/2017 2120   APPEARANCEUR HAZY (A) 12/21/2017 2120   LABSPEC 1.028 12/21/2017 2120   PHURINE 5.0 12/21/2017 2120   GLUCOSEU 150 (A) 12/21/2017 2120   HGBUR LARGE (A) 12/21/2017 2120   BILIRUBINUR NEGATIVE 12/21/2017 2120   [place] NEGATIVE 12/21/2017 2120   PROTEINUR 30 (A) 12/21/2017 2120   NITRITE NEGATIVE 12/21/2017 2120   LEUKOCYTESUR NEGATIVE 12/21/2017 2120     Bonnell Public M.D. [facility] 11/27/2019, 3:24 PM   Call night coverage person covering after 7pm

## 2019-11-28 DIAGNOSIS — Z8719 Personal history of other diseases of the digestive system: Secondary | ICD-10-CM

## 2019-11-28 DIAGNOSIS — J449 Chronic obstructive pulmonary disease, unspecified: Secondary | ICD-10-CM | POA: Diagnosis not present

## 2019-11-28 DIAGNOSIS — Z7901 Long term (current) use of anticoagulants: Secondary | ICD-10-CM | POA: Diagnosis not present

## 2019-11-28 DIAGNOSIS — R079 Chest pain, unspecified: Secondary | ICD-10-CM | POA: Diagnosis not present

## 2019-11-28 DIAGNOSIS — F0281 Dementia in other diseases classified elsewhere with behavioral disturbance: Secondary | ICD-10-CM | POA: Diagnosis not present

## 2019-11-28 LAB — CBC
HCT: 30.9 % — ABNORMAL LOW (ref 39.0–52.0)
Hemoglobin: 9 g/dL — ABNORMAL LOW (ref 13.0–17.0)
MCH: 23.7 pg — ABNORMAL LOW (ref 26.0–34.0)
MCHC: 29.1 g/dL — ABNORMAL LOW (ref 30.0–36.0)
MCV: 81.5 fL (ref 80.0–100.0)
Platelets: 93 10*3/uL — ABNORMAL LOW (ref 150–400)
RBC: 3.79 MIL/uL — ABNORMAL LOW (ref 4.22–5.81)
RDW: 24.6 % — ABNORMAL HIGH (ref 11.5–15.5)
WBC: 6.1 10*3/uL (ref 4.0–10.5)
nRBC: 0 % (ref 0.0–0.2)

## 2019-11-28 LAB — BASIC METABOLIC PANEL
Anion gap: 8 (ref 5–15)
BUN: 14 mg/dL (ref 8–23)
CO2: 25 mmol/L (ref 22–32)
Calcium: 8.5 mg/dL — ABNORMAL LOW (ref 8.9–10.3)
Chloride: 101 mmol/L (ref 98–111)
Creatinine, Ser: 1.14 mg/dL (ref 0.61–1.24)
GFR calc Af Amer: >60 mL/min (ref >60)
GFR calc non Af Amer: >60 mL/min (ref >60)
Glucose, Bld: 174 mg/dL — ABNORMAL HIGH (ref 70–99)
Potassium: 3.7 mmol/L (ref 3.5–5.1)
Sodium: 134 mmol/L — ABNORMAL LOW (ref 135–145)

## 2019-11-28 LAB — GLUCOSE, CAPILLARY
Glucose-Capillary: 189 mg/dL — ABNORMAL HIGH (ref 70–99)
Glucose-Capillary: 198 mg/dL — ABNORMAL HIGH (ref 70–99)
Glucose-Capillary: 180 mg/dL — ABNORMAL HIGH (ref 70–99)
Glucose-Capillary: 236 mg/dL — ABNORMAL HIGH (ref 70–99)

## 2019-11-28 LAB — PROTIME-INR
INR: 2.4 — ABNORMAL HIGH (ref 0.8–1.2)
Prothrombin Time: 25.6 seconds — ABNORMAL HIGH (ref 11.4–15.2)

## 2019-11-28 LAB — HEPARIN LEVEL (UNFRACTIONATED): Heparin Unfractionated: 0.31 IU/mL (ref 0.30–0.70)

## 2019-11-28 MED ORDER — WARFARIN SODIUM 2 MG PO TABS
4.0000 mg | ORAL_TABLET | Freq: Once | ORAL | Status: AC
  Administered 2019-11-28: 4 mg via ORAL
  Filled 2019-11-28: qty 2

## 2019-11-28 MED ORDER — LIDOCAINE 5 % EX PTCH
1.0000 | MEDICATED_PATCH | CUTANEOUS | Status: DC
  Administered 2019-11-28: 1 via TRANSDERMAL
  Filled 2019-11-28: qty 1

## 2019-11-28 NOTE — Progress Notes (Signed)
[facility]                                                                              Patient Demographics  Adam Savage, is a 69 y.o. male, DOB - [birth year], HYI:[id number]  Admit date - 11/22/2019   Admitting Physician Kathie Dike, MD  Outpatient Primary MD for the patient is [name], MD  Outpatient specialists:   LOS - 4  days   Medical records reviewed and are as summarized below:    Chief Complaint  Patient presents with  . Chest Pain       Brief summary   Patient is a 69 year old Caucasian male, morbidly obese, with past medical history significant for diabetes mellitus type 2, hyperlipidemia, status post aortic valve replacement on Coumadin, dementia, status post lobectomy of brain, multiple recent falls and history of GI bleed.  Patient was admitted with chest pain, deemed atypical by the cardiology team and has been cleared for discharge by the cardiology team.  Patient has also undergone colonoscopy.    Colonoscopy done on 11/25/2019 revealed: -The examined portion of the ileum was normal. - A single non-bleeding colonic angiodysplastic lesion. - Two 6 to 7 mm polyps in the descending colon, removed with a jumbo cold forceps. Resected and retrieved. Clips (MR conditional) were placed. 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 Ablated with APC. - Patent end-to-end colo-colonic anastomosis, characterized by healthy appearing mucosa. - External hemorrhoids.  Patient is currently on heparin and Coumadin.  INR is 2.4 today.  Likely discharge back to the skilled nursing facility tomorrow.    Assessment & Plan    Principal Problem: Atypical chest pain -Cardiac enzymes negative.   -Patient was seen and cleared for discharge by the cardiology team.   -2D echo revealed EF of 65 to 70%, no regional wall motion abnormalities -GI work-up for acute blood loss anemia done, see above for colonoscopy report. -Coumadin was initially held, but has  been restarted.  Patient was started on Coumadin and heparin bridge.  INR is 2.4 today. -Likely discharge tomorrow.    Acute blood loss anemia secondary to GI bleed -Hemoglobin was down to 6.9 today, transfused 2 units packed RBCs. -Received 2 units FFP on 8/11, for elevated INR, 3.2 -Hemoglobin has remained stable.  Hemoglobin today 9 g/dL.    COPD (chronic obstructive pulmonary disease) (HCC) -No evidence of wheezing, continue bronchodilators as needed 11/26/2019: Stable.  Dementia -Currently pleasant and cooperative, continue Namenda, Seroquel and Lexapro 11/28/2019: No behavioral problems.  Seizure disorder Continue Keppra  S/P AVR (aortic valve replacement) on chronic anticoagulation -Currently on heparin bridge and Coumadin. -INR today is 2.4.   -Likely discharge tomorrow.    Generalized weakness with history of falls -Status post PT evaluation, recommended home health PT  GERD -Continue PPI   Morbid obesity Estimated body mass index is 40.9 kg/m as calculated from the following:   Height as of this encounter: 6' (1.829 m).   Weight as of this encounter: 136.8 kg.  -Diet and weight control recommended  Code Status: Partial DVT Prophylaxis: Coumadin currently on hold Family Communication: Discussed all imaging results, lab results,  explained to the patient   Disposition Plan:     Status is: Inpatient  Remains inpatient appropriate because:Inpatient level of care appropriate due to severity of illness   Dispo: The patient is from: ALF              Anticipated d/c is to: ALF              Anticipated d/c date is: 2 days              Patient currently is not medically stable to d/c.  Ongoing GI work-up, will need heparin bridge/Lovenox with Coumadin due to aortic mechanical valve. 11/26/2019: Patient will be discharged once INR is optimized.      Time Spent in minutes   25 minutes  Procedures:  Colonoscopy  Consultants:   Cardiology GI  Antimicrobials:    Anti-infectives (From admission, onward)   None         Medications  Scheduled Meds: . sodium chloride   Intravenous Once  . sodium chloride   Intravenous Once  . atorvastatin  10 mg Oral Daily  . escitalopram  10 mg Oral QHS  . HYDROcodone-acetaminophen  1 tablet Oral Q8H  . insulin aspart  0-9 Units Subcutaneous TID WC  . lactulose  10 g Oral BID  . levETIRAcetam  1,000 mg Oral BID  . memantine  5 mg Oral BID  . pantoprazole  40 mg Oral Daily  . QUEtiapine  100 mg Oral QHS  . QUEtiapine  25 mg Oral Daily  . spironolactone  50 mg Oral Daily  . warfarin  4 mg Oral ONCE-1600  . Warfarin - Pharmacist Dosing Inpatient   Does not apply q1600   Continuous Infusions: . heparin 1,600 Units/hr (11/28/19 1334)   PRN Meds:.acetaminophen, albuterol, ondansetron (ZOFRAN) IV      Subjective:  No new complaints.  Objective:   Vitals:   11/27/19 1326 11/27/19 2010 11/28/19 0510 11/28/19 1338  BP: 108/67 129/64 (!) 106/58 126/62  Pulse: 79 83 89 86  Resp: 18 16 16 16   Temp: (!) 97.5 F (36.4 C) 97.9 F (36.6 C) 97.8 F (36.6 C) 98.8 F (37.1 C)  TempSrc: Oral   Oral  SpO2: 99% 96% 98% 99%  Weight:      Height:        Intake/Output Summary (Last 24 hours) at 11/28/2019 1600 Last data filed at 11/28/2019 1028 Gross per 24 hour  Intake --  Output 100 ml  Net -100 ml     Wt Readings from Last 3 Encounters:  11/23/19 (!) 136.8 kg  11/11/19 (!) 138.8 kg  11/07/19 (!) 140.2 kg     Exam  General: Awake and alert.  Patient is morbidly obese.  Patient is not in any obvious distress.    Cardiovascular: S1 S2 auscultated  Respiratory: Clear to auscultation bilaterally.  Gastrointestinal: Morbidly obese, soft and nontender.  Organs are difficult to assess.    Ext: no pedal edema bilaterally  Neuro: Currently alert.  Patient moves all extremities.    Data Reviewed:  I have personally reviewed following labs and imaging studies  Micro Results Recent  Results (from the past 240 hour(s))  SARS Coronavirus 2 by RT PCR (hospital order, performed in [facility] hospital lab) Nasopharyngeal Nasopharyngeal Swab     Status: None   Collection Time: 11/22/19  9:50 PM   Specimen: Nasopharyngeal Swab  Result Value Ref Range Status   SARS Coronavirus 2 NEGATIVE NEGATIVE Final  Comment: (NOTE) SARS-CoV-2 target nucleic acids are NOT DETECTED.  The SARS-CoV-2 RNA is generally detectable in upper and lower respiratory specimens during the acute phase of infection. The lowest concentration of SARS-CoV-2 viral copies this assay can detect is 250 copies / mL. A negative result does not preclude SARS-CoV-2 infection and should not be used as the sole basis for treatment or other patient management decisions.  A negative result may occur with improper specimen collection / handling, submission of specimen other than nasopharyngeal swab, presence of viral mutation(s) within the areas targeted by this assay, and inadequate number of viral copies (<250 copies / mL). A negative result must be combined with clinical observations, patient history, and epidemiological information.  Fact Sheet for Patients:   StrictlyIdeas.no  Fact Sheet for Healthcare Providers: BankingDealers.co.za  This test is not yet approved or  cleared by the [place] FDA and has been authorized for detection and/or diagnosis of SARS-CoV-2 by FDA under an Emergency Use Authorization (EUA).  This EUA will remain in effect (meaning this test can be used) for the duration of the COVID-19 declaration under Section 564(b)(1) of the Act, 21 U.S.C. section 360bbb-3(b)(1), unless the authorization is terminated or revoked sooner.  Performed at [facility], [address], [place], [place] [address]     Radiology Reports DG Chest [place] 1 View  Result Date: 11/22/2019 CLINICAL DATA:  Chest pain EXAM: PORTABLE CHEST 1 VIEW COMPARISON:   01/17/2018 FINDINGS: Prior median sternotomy and valve replacement. Cardiomegaly. No edema or effusions. Bibasilar opacities could reflect atelectasis or scarring. IMPRESSION: Cardiomegaly.  Bibasilar atelectasis or scarring. Electronically Signed   By: Rolm Baptise M.D.   On: 11/22/2019 20:45   ECHOCARDIOGRAM COMPLETE  Result Date: 11/24/2019    ECHOCARDIOGRAM REPORT   Patient Name:   Adam Savage Date of Exam: 11/24/2019 Medical Rec #:  [id number]    Height:       72.0 in Accession #:    [id number]   Weight:       301.6 lb Date of Birth:  08/04/1950     BSA:          2.537 m Patient Age:    62 years     BP:           103/46 mmHg Patient Gender: M            HR:           66 bpm. Exam Location:  [facility] Procedure: 2D Echo Indications:    Chest Pain 786.50 / R07.9  History:        Patient has prior history of Echocardiogram examinations, most                 recent 10/29/2017. CHF, COPD; Risk Factors:Diabetes and Former                 Smoker. AVR 2002 [employer] Mechanical aortic valve.  Sonographer:    Leavy Cella RDCS (AE) Referring Phys: [id number] [place]  1. Left ventricular ejection fraction, by estimation, is 65 to 70%. The left ventricle has normal function. The left ventricle has no regional wall motion abnormalities. There is moderate left ventricular hypertrophy. Left ventricular diastolic parameters were normal.  2. Right ventricular systolic function is normal. The right ventricular size is normal. There is normal pulmonary artery systolic pressure.  3. The mitral valve is normal in structure. No evidence of mitral valve regurgitation. No evidence of mitral stenosis.  4.  History of aortic valve replacement, unknown type     . The aortic valve has been repaired/replaced. Aortic valve regurgitation is not visualized. Aortic valve mean gradient measures 21.8 mmHg.  5. The inferior vena cava is normal in size with greater than 50% respiratory variability, suggesting right atrial  pressure of 3 mmHg. FINDINGS  Left Ventricle: History of aortic valve replacement, unknown valve type. Left ventricular ejection fraction, by estimation, is 65 to 70%. The left ventricle has normal function. The left ventricle has no regional wall motion abnormalities. The left ventricular internal cavity size was normal in size. There is moderate left ventricular hypertrophy. Left ventricular diastolic parameters were normal. Right Ventricle: The right ventricular size is normal. No increase in right ventricular wall thickness. Right ventricular systolic function is normal. There is normal pulmonary artery systolic pressure. The tricuspid regurgitant velocity is 2.31 m/s, and  with an assumed right atrial pressure of 10 mmHg, the estimated right ventricular systolic pressure is 14.7 mmHg. Left Atrium: Left atrial size was normal in size. Right Atrium: Right atrial size was normal in size. Pericardium: There is no evidence of pericardial effusion. Mitral Valve: The mitral valve is normal in structure. There is mild thickening of the mitral valve leaflet(s). There is mild calcification of the mitral valve leaflet(s). Mild mitral annular calcification. No evidence of mitral valve regurgitation. No evidence of mitral valve stenosis. Tricuspid Valve: The tricuspid valve is normal in structure. Tricuspid valve regurgitation is trivial. No evidence of tricuspid stenosis. Aortic Valve: History of aortic valve replacement, unknown type. The aortic valve has been repaired/replaced.. There is mild thickening and mild calcification of the aortic valve. Aortic valve regurgitation is not visualized. Mild aortic valve annular calcification. There is mild thickening of the aortic valve. There is mild calcification of the aortic valve. Aortic valve mean gradient measures 21.8 mmHg. Aortic valve peak gradient measures 40.0 mmHg. Aortic valve area, by VTI measures 1.22 cm. Pulmonic Valve: The pulmonic valve was not well visualized.  Pulmonic valve regurgitation is not visualized. No evidence of pulmonic stenosis. Aorta: The aortic root is normal in size and structure. Pulmonary Artery: Indeterminant PASP, inadequate TR jet. Venous: The inferior vena cava is normal in size with greater than 50% respiratory variability, suggesting right atrial pressure of 3 mmHg. IAS/Shunts: The interatrial septum was not well visualized.  LEFT VENTRICLE PLAX 2D LVIDd:         4.89 cm  Diastology LVIDs:         2.78 cm  LV e' lateral:   13.80 cm/s LV PW:         1.45 cm  LV E/e' lateral: 7.9 LV IVS:        1.53 cm  LV e' medial:    7.83 cm/s LVOT diam:     2.00 cm  LV E/e' medial:  13.9 LV SV:         79 LV SV Index:   31 LVOT Area:     3.14 cm  RIGHT VENTRICLE RV S prime:     11.00 cm/s TAPSE (M-mode): 1.5 cm LEFT ATRIUM             Index       RIGHT ATRIUM           Index LA diam:        3.80 cm 1.50 cm/m  RA Area:     12.30 cm LA Vol (A2C):   82.7 ml 32.60 ml/m RA Volume:   22.60 ml  8.91 ml/m LA Vol (A4C):   62.1 ml 24.48 ml/m LA Biplane Vol: 77.3 ml 30.47 ml/m  AORTIC VALVE AV Area (Vmax):    1.15 cm AV Area (Vmean):   1.04 cm AV Area (VTI):     1.22 cm AV Vmax:           316.33 cm/s AV Vmean:          218.927 cm/s AV VTI:            0.648 m AV Peak Grad:      40.0 mmHg AV Mean Grad:      21.8 mmHg LVOT Vmax:         115.71 cm/s LVOT Vmean:        72.264 cm/s LVOT VTI:          0.252 m LVOT/AV VTI ratio: 0.39  AORTA Ao Root diam: 3.40 cm MITRAL VALVE                TRICUSPID VALVE MV Area (PHT): 3.83 cm     TR Peak grad:   21.3 mmHg MV Decel Time: 198 msec     TR Vmax:        231.00 cm/s MV E velocity: 109.00 cm/s MV A velocity: 108.00 cm/s  SHUNTS MV E/A ratio:  1.01         Systemic VTI:  0.25 m                             Systemic Diam: 2.00 cm Carlyle Dolly MD Electronically signed by Carlyle Dolly MD Signature Date/Time: 11/24/2019/12:01:35 PM    Final    US ABDOMEN LIMITED RUQ  Result Date: 11/18/2019 CLINICAL DATA:  Hepatic cirrhosis.  EXAM: ULTRASOUND ABDOMEN LIMITED RIGHT UPPER QUADRANT COMPARISON:  March 29, 2019. FINDINGS: Gallbladder: Status post cholecystectomy. Common bile duct: Diameter: 5 mm which is within normal limits. Liver: Heterogeneous echotexture is noted with slightly nodular contours suggesting hepatic cirrhosis. Stable 1 cm right hepatic cyst is noted. Portal vein is patent on color Doppler imaging with normal direction of blood flow towards the liver. Other: None. IMPRESSION: Status post cholecystectomy. Findings consistent with hepatic cirrhosis. Stable small right hepatic cyst. Electronically Signed   By: Marijo Conception M.D.   On: 11/18/2019 15:37    Lab Data:  CBC: Recent Labs  Lab 11/22/19 2013 11/22/19 2013 11/23/19 0527 11/23/19 1737 11/24/19 1954 11/25/19 0557 11/25/19 1455 11/27/19 0755 11/28/19 0730  WBC 5.7  --  6.4  --   --  5.9  --  7.0 6.1  NEUTROABS 4.1  --   --   --   --   --   --   --   --   HGB 7.7*   < > 8.0*   < > 7.7* 6.9* 8.0* 8.5* 9.0*  HCT 27.1*   < > 28.4*   < > 26.9* 23.6* 27.8* 29.1* 30.9*  MCV 73.6*  --  73.4*  --   --  75.2*  --  78.4* 81.5  PLT 108*  --  107*  --   --  101*  --  99* 93*   < > = values in this interval not displayed.   Basic Metabolic Panel: Recent Labs  Lab 11/23/19 0527 11/25/19 0557 11/26/19 0849 11/27/19 0755 11/28/19 0730  NA 136 136 134* 136 134*  K 3.5 3.7 3.5 3.5 3.7  CL 100 99 98 101 101  CO2 25 28 25 26 25   GLUCOSE 143* 140* 149* 141* 174*  BUN 22 18 14 14 14   CREATININE 1.35* 1.20 1.22 1.14 1.14  CALCIUM 8.4* 8.6* 8.6* 8.5* 8.5*   GFR: Estimated Creatinine Clearance: 87.6 mL/min (by C-G formula based on SCr of 1.14 mg/dL). Liver Function Tests: Recent Labs  Lab 11/22/19 2013  AST 28  ALT 19  ALKPHOS 162*  BILITOT 1.0  PROT 7.0  ALBUMIN 3.2*   No results for input(s): LIPASE, AMYLASE in the last 168 hours. No results for input(s): AMMONIA in the last 168 hours. Coagulation Profile: Recent Labs  Lab  11/25/19 0557 11/25/19 1840 11/26/19 0849 11/27/19 0755 11/28/19 0730  INR 2.0* 1.8* 1.8* 1.8* 2.4*   Cardiac Enzymes: No results for input(s): CKTOTAL, CKMB, CKMBINDEX, TROPONINI in the last 168 hours. BNP (last 3 results) No results for input(s): PROBNP in the last 8760 hours. HbA1C: No results for input(s): HGBA1C in the last 72 hours. CBG: Recent Labs  Lab 11/27/19 1155 11/27/19 1624 11/27/19 2043 11/28/19 0746 11/28/19 1121  GLUCAP 158* 212* 210* 189* 198*   Lipid Profile: No results for input(s): CHOL, HDL, LDLCALC, TRIG, CHOLHDL, LDLDIRECT in the last 72 hours. Thyroid Function Tests: No results for input(s): TSH, T4TOTAL, FREET4, T3FREE, THYROIDAB in the last 72 hours. Anemia Panel: No results for input(s): VITAMINB12, FOLATE, FERRITIN, TIBC, IRON, RETICCTPCT in the last 72 hours. Urine analysis:    Component Value Date/Time   COLORURINE YELLOW 12/21/2017 2120   APPEARANCEUR HAZY (A) 12/21/2017 2120   LABSPEC 1.028 12/21/2017 2120   PHURINE 5.0 12/21/2017 2120   GLUCOSEU 150 (A) 12/21/2017 2120   HGBUR LARGE (A) 12/21/2017 2120   BILIRUBINUR NEGATIVE 12/21/2017 2120   [place] NEGATIVE 12/21/2017 2120   PROTEINUR 30 (A) 12/21/2017 2120   NITRITE NEGATIVE 12/21/2017 2120   LEUKOCYTESUR NEGATIVE 12/21/2017 2120     Bonnell Public M.D. [facility] 11/28/2019, 4:00 PM   Call night coverage person covering after 7pm

## 2019-11-28 NOTE — Progress Notes (Signed)
ANTICOAGULATION CONSULT NOTE -   Pharmacy Consult for Warfarin Indication: Mechanical Valve   Patient Measurements: Height: 6' (182.9 cm) Weight: (!) 136.8 kg (301 lb 9.4 oz) IBW/kg (Calculated) : 77.6 Heparin Dosing Weight: 109 kg  Vital Signs: Temp: 97.8 F (36.6 C) (08/15 0510) BP: 106/58 (08/15 0510) Pulse Rate: 89 (08/15 0510)  Labs: Recent Labs    11/25/19 1455 11/25/19 1455 11/25/19 1840 11/26/19 0849 11/26/19 0849 11/26/19 1544 11/27/19 0755 11/28/19 0730  HGB 8.0*   < >  --   --   --   --  8.5* 9.0*  HCT 27.8*  --   --   --   --   --  29.1* 30.9*  PLT  --   --   --   --   --   --  99* 93*  LABPROT  --   --    < > 20.5*  --   --  19.9* 25.6*  INR  --   --    < > 1.8*  --   --  1.8* 2.4*  HEPARINUNFRC  --   --   --  0.24*   < > 0.32 0.39 0.31  CREATININE  --   --   --  1.22  --   --  1.14 1.14   < > = values in this interval not displayed.    Estimated Creatinine Clearance: 87.6 mL/min (by C-G formula based on SCr of 1.14 mg/dL).   Assessment: Patient with chronic warfarin for mechanical valve. Patient with acute blood loss anemia secondary to GI bleed.  GI cleared patient to restart warfarin/heparin bridge.  11/28/19 AM update: Heparin level: 0.31 IU/mL, now within goal range on heparin at 1600 units/hr INR 2.4--> within  goal range CBC: Hb 6.9> 8.0>8.5>9.0    Plates 101>99>93  (hepatic cirrhosis d/t HCV) RN reports no bleeding complications or issues with infusion site    Goal of Therapy:  HL 0.3-0.7 INR 2-3 Monitor platelets by anticoagulation protocol: Yes   Plan:  Continue heparin infusion rate at 1600 units/hr Give  warfarin  4mg  x 1 dose tonight  Check heparin level daily Monitor CBC and s/s of bleeding   Despina Pole, Pharm. D. Clinical Pharmacist 11/28/2019 12:11 PM   11/28/2019 12:11 PM

## 2019-11-28 NOTE — Progress Notes (Signed)
Nurse called due to patient complaining of chest pain.  Chart was reviewed, patient appears to have atypical chest pain and has already been cleared by cardiologist. At bedside, patient's chest pain was reproducible.  Lidocaine patch was given, incentive spirometry was given due to patient not taking deep breath.  Troponin was ordered, EKG with normal sinus rhythm with sinus arrhythmia at a rate of 83 bpm. We shall continue to monitor and treat accordingly.

## 2019-11-29 ENCOUNTER — Encounter (HOSPITAL_COMMUNITY): Payer: Self-pay | Admitting: Internal Medicine

## 2019-11-29 DIAGNOSIS — Z952 Presence of prosthetic heart valve: Secondary | ICD-10-CM

## 2019-11-29 DIAGNOSIS — Z794 Long term (current) use of insulin: Secondary | ICD-10-CM

## 2019-11-29 DIAGNOSIS — R079 Chest pain, unspecified: Secondary | ICD-10-CM | POA: Diagnosis not present

## 2019-11-29 DIAGNOSIS — B182 Chronic viral hepatitis C: Secondary | ICD-10-CM | POA: Diagnosis not present

## 2019-11-29 DIAGNOSIS — F0281 Dementia in other diseases classified elsewhere with behavioral disturbance: Secondary | ICD-10-CM | POA: Diagnosis not present

## 2019-11-29 DIAGNOSIS — E119 Type 2 diabetes mellitus without complications: Secondary | ICD-10-CM

## 2019-11-29 DIAGNOSIS — Z7901 Long term (current) use of anticoagulants: Secondary | ICD-10-CM | POA: Diagnosis not present

## 2019-11-29 LAB — SURGICAL PATHOLOGY

## 2019-11-29 LAB — TROPONIN I (HIGH SENSITIVITY)
Troponin I (High Sensitivity): 8 ng/L (ref <18)
Troponin I (High Sensitivity): 8 ng/L (ref <18)

## 2019-11-29 LAB — CBC
HCT: 29.5 % — ABNORMAL LOW (ref 39.0–52.0)
Hemoglobin: 8.7 g/dL — ABNORMAL LOW (ref 13.0–17.0)
MCH: 24.2 pg — ABNORMAL LOW (ref 26.0–34.0)
MCHC: 29.5 g/dL — ABNORMAL LOW (ref 30.0–36.0)
MCV: 81.9 fL (ref 80.0–100.0)
Platelets: 93 10*3/uL — ABNORMAL LOW (ref 150–400)
RBC: 3.6 MIL/uL — ABNORMAL LOW (ref 4.22–5.81)
RDW: 25.7 % — ABNORMAL HIGH (ref 11.5–15.5)
WBC: 5.2 10*3/uL (ref 4.0–10.5)
nRBC: 0 % (ref 0.0–0.2)

## 2019-11-29 LAB — GLUCOSE, CAPILLARY
Glucose-Capillary: 121 mg/dL — ABNORMAL HIGH (ref 70–99)
Glucose-Capillary: 182 mg/dL — ABNORMAL HIGH (ref 70–99)

## 2019-11-29 LAB — PROTIME-INR
INR: 3.9 — ABNORMAL HIGH (ref 0.8–1.2)
Prothrombin Time: 37.1 seconds — ABNORMAL HIGH (ref 11.4–15.2)

## 2019-11-29 LAB — HEPARIN LEVEL (UNFRACTIONATED): Heparin Unfractionated: 0.27 IU/mL — ABNORMAL LOW (ref 0.30–0.70)

## 2019-11-29 MED ORDER — HYDROCODONE-ACETAMINOPHEN 5-325 MG PO TABS: 1.0000 | ORAL_TABLET | Freq: Two times a day (BID) | ORAL | 0 refills | Status: DC | PRN

## 2019-11-29 NOTE — Discharge Summary (Signed)
Physician Discharge Summary  Adam Savage MOQ:[id number] DOB: 1951-01-24 DOA: 11/22/2019  PCP: [name], MD  Admit date: 11/22/2019 Discharge date: 11/29/2019  Admitted From: ALF Disposition:  ALF  Recommendations for Outpatient Follow-up:  1. Follow up with PCP in 1-2 weeks 2. Repeat lab work in 3 days upon discharge-BMP and INR. 3. Resume home dose of Coumadin starting tomorrow, 11/30/2019   Discharge Condition: Stable CODE STATUS: CPR okay, no intubation Diet recommendation:   Brief/Interim Summary: 69 year old Caucasian male, morbidly obese, with past medical history significant for diabetes mellitus type 2, hyperlipidemia, status post aortic valve replacement on Coumadin, dementia, status post lobectomy of brain, multiple recent falls and history of GI bleed.  Patient's chest pain was deemed to be atypical by cardiology team.  Underwent colonoscopy on 8/12 which showed angiodysplastic lesion, colonic polyp-MR conditional was placed and embolus was noted.  Heparin was bridged to Coumadin. Patient is medically stable to be discharged with recommendations as stated above  Body mass index is 40.9 kg/m.     Atypical chest pain -Noncardiac in nature.  Echocardiogram showed EF of 65 to 70%.  Seen by cardiology team.  Acute blood loss anemia secondary to GI bleed -Status post 2 units PRBC transfusion, now hemoglobin remained stable.  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  Cleared by GI to resume anticoagulation.  COPD (chronic obstructive pulmonary disease) (HCC) -Continue home bronchodilators  Dementia -Continue home medications  Seizure disorder Continue Keppra  S/P AVR (aortic valve replacement) on chronic anticoagulation -Now INR is therapeutic, matter-of-fact slightly above the therapeutic range.  Advised to hold Coumadin today, resume tomorrow.  Discontinue heparin drip.  Generalized weakness with history of falls -Outpatient  PT/OT-home health  GERD -Continue PPI   Morbid obesity Estimated body mass index is 40.9 kg/m as calculated from the following:   Height as of this encounter: 6' (1.829 m).   Weight as of this encounter: 136.8 kg.  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  No other complaints.  Discharge Exam: Vitals:   11/28/19 2200 11/29/19 0559  BP: (!) 106/56 126/70  Pulse: 74 78  Resp: 20 20  Temp: 98.6 F (37 C) (!) 97.5 F (36.4 C)  SpO2: 100% 100%   Vitals:   11/28/19 0510 11/28/19 1338 11/28/19 2200 11/29/19 0559  BP: (!) 106/58 126/62 (!) 106/56 126/70  Pulse: 89 86 74 78  Resp: 16 16 20 20   Temp: 97.8 F (36.6 C) 98.8 F (37.1 C) 98.6 F (37 C) (!) 97.5 F (36.4 C)  TempSrc:  Oral  Oral  SpO2: 98% 99% 100% 100%  Weight:      Height:        General: Pt is alert, awake, not in acute distress Cardiovascular: RRR, S1/S2 +, no rubs, no gallops Respiratory: CTA bilaterally, no  wheezing, no rhonchi Abdominal: Soft, NT, ND, bowel sounds + Extremities: no edema, no cyanosis  Discharge Instructions  Discharge Instructions    Discharge patient   Complete by: As directed    ALF   Discharge disposition: [place] Not Defined   Discharge patient date: 11/29/2019   Increase activity slowly    Complete by: As directed      Allergies as of 11/29/2019      Reactions   Oxycodone Anxiety   Ativan [lorazepam]    Morphine And Related Other (See Comments)   Combative      Medication List    TAKE these medications   Advocate Insulin Pen Needles 31G X 5 MM Misc Generic drug: Insulin Pen Needle 1 Package by Does not apply route 4 (four) times daily.   albuterol 108 (90 Base) MCG/ACT inhaler Commonly known as: VENTOLIN HFA Inhale 2 puffs into the lungs every 4 (four) hours as needed for wheezing or shortness of breath.   atorvastatin 10 MG tablet Commonly known as: LIPITOR Take 1 tablet (10 mg total) by mouth daily.   bacitracin ointment Apply 1 application topically daily.   carvedilol 6.25 MG tablet Commonly known as: COREG Take 1 tablet (6.25 mg total) by mouth 2 (two) times daily with a meal.   chlorhexidine 0.12 % solution Commonly known as: PERIDEX Use as directed 15 mLs in the mouth or throat 2 (two) times daily.   escitalopram 10 MG tablet Commonly known as: LEXAPRO Take 1 tablet (10 mg total) by mouth at bedtime.   ferrous sulfate 325 (65 FE) MG EC tablet Take 1 tablet (325 mg total) by mouth 2 (two) times daily.   furosemide 80 MG tablet Commonly known as: LASIX Take 80 mg by mouth 2 (two) times daily.   glucose blood test strip Test blood sugar 3 times daily before meals   HYDROcodone-acetaminophen 5-325 MG tablet Commonly known as: NORCO/VICODIN Take 1 tablet by mouth every 12 (twelve) hours as needed for moderate pain.   insulin detemir 100 UNIT/ML injection Commonly known as: Levemir Inject 0.28 mLs (28 Units total) into the skin at bedtime. Increase Levemir by 2 units when the week average is >200.   lactulose 10 GM/15ML solution Commonly known as: CHRONULAC Take 15 mLs (10 g total) by mouth 2 (two) times daily.   Lancets Misc 1 Stick by Does not apply route 3 (three) times daily with meals.   levETIRAcetam 1000 MG tablet Commonly  known as: KEPPRA Take 1 tablet (1,000 mg total) by mouth 2 (two) times daily.   memantine 5 MG tablet Commonly known as: NAMENDA Take 5 mg by mouth 2 (two) times daily.   NovoLOG FlexPen 100 UNIT/ML FlexPen Generic drug: insulin aspart INJECT 10 UNITS INTO THE SKIN 3 (THREE) TIMES DAILY WITH MEALS. ADJUST DOSE AS NEEDED   pantoprazole 40 MG tablet Commonly known as: PROTONIX Take 1 tablet (40 mg total) by mouth daily.   QUEtiapine 25 MG tablet Commonly known as: SEROquel Take 1 tablet in AM   QUEtiapine 100 MG tablet Commonly known as: SEROQUEL TAKE 1 TABLETS IN THE EVENING   spironolactone 50 MG tablet Commonly known as: ALDACTONE Take 50 mg by mouth daily.   warfarin 4 MG tablet Commonly known as: COUMADIN Take as directed. If you are unsure how to take this medication, talk to your nurse or doctor. Original instructions: Take 1 tablet (4 mg total) by mouth daily.       Contact information  for follow-up providers    [name], MD. Schedule an appointment as soon as possible for a visit in 1 week(s).   Specialty: Internal Medicine Contact information: [address] [address] [phone]        Jettie Booze, MD .   Specialties: Cardiology, Radiology, Interventional Cardiology Contact information: [address] [address] [place], [facility] Follow up.   Specialty: [place] Why: PT Contact information: [place] [address] [phone]            Contact information for after-discharge care    Destination    [facility] .   Service: Assisted Living Contact information: [address] [place] [address] [phone]                 Allergies  Allergen Reactions  . Oxycodone Anxiety  . Ativan [Lorazepam]   . Morphine And Related Other (See Comments)    Combative     You were cared for by a  hospitalist during your hospital stay. If you have any questions about your discharge medications or the care you received while you were in the hospital after you are discharged, you can call the unit and asked to speak with the hospitalist on call if the hospitalist that took care of you is not available. Once you are discharged, your primary care physician will handle any further medical issues. Please note that no refills for any discharge medications will be authorized once you are discharged, as it is imperative that you return to your primary care physician (or establish a relationship with a primary care physician if you do not have one) for your aftercare needs so that they can reassess your need for medications and monitor your lab values.   Procedures/Studies: DG Chest Port 1 View  Result Date: 11/22/2019 CLINICAL DATA:  Chest pain EXAM: PORTABLE CHEST 1 VIEW COMPARISON:  01/17/2018 FINDINGS: Prior median sternotomy and valve replacement. Cardiomegaly. No edema or effusions. Bibasilar opacities could reflect atelectasis or scarring. IMPRESSION: Cardiomegaly.  Bibasilar atelectasis or scarring. Electronically Signed   By: Rolm Baptise M.D.   On: 11/22/2019 20:45   ECHOCARDIOGRAM COMPLETE  Result Date: 11/24/2019    ECHOCARDIOGRAM REPORT   Patient Name:   Adam Savage Date of Exam: 11/24/2019 Medical Rec #:  [id number]    Height:       72.0 in Accession #:    [id number]   Weight:       301.6 lb Date of Birth:  05-09-1950     BSA:          2.537 m Patient Age:    44 years     BP:           103/46 mmHg Patient Gender: M            HR:           66 bpm. Exam Location:  [facility] Procedure: 2D Echo Indications:    Chest Pain 786.50 / R07.9  History:        Patient has prior history of Echocardiogram examinations, most                 recent 10/29/2017. CHF, COPD; Risk Factors:Diabetes and Former                 Smoker. AVR 2002 [employer] Mechanical aortic valve.  Sonographer:    Leavy Cella RDCS (AE)  Referring Phys: [id number] [place]  1. Left ventricular ejection fraction, by estimation, is 65 to 70%. The left ventricle has normal function. The left ventricle has no regional wall motion abnormalities. There is moderate left ventricular hypertrophy. Left ventricular diastolic parameters were normal.  2. Right ventricular systolic function is normal. The right ventricular size is normal. There is normal pulmonary artery systolic pressure.  3. The mitral valve is normal in structure. No evidence of mitral valve regurgitation. No evidence of mitral stenosis.  4. History of aortic valve replacement, unknown type     . The aortic valve has been repaired/replaced. Aortic valve regurgitation is not visualized. Aortic valve mean gradient measures 21.8 mmHg.  5. The inferior vena cava is normal in size with greater than 50% respiratory variability, suggesting right atrial pressure of 3 mmHg. FINDINGS  Left Ventricle: History of aortic valve replacement, unknown valve type. Left ventricular ejection fraction, by estimation, is 65 to 70%. The left ventricle has normal function. The left ventricle has no regional wall motion abnormalities. The left ventricular internal cavity size was normal in size. There is moderate left ventricular hypertrophy. Left ventricular diastolic parameters were normal. Right Ventricle: The right ventricular size is normal. No increase in right ventricular wall thickness. Right ventricular systolic function is normal. There is normal pulmonary artery systolic pressure. The tricuspid regurgitant velocity is 2.31 m/s, and  with an assumed right atrial pressure of 10 mmHg, the estimated right ventricular systolic pressure is 23.5 mmHg. Left Atrium: Left atrial size was normal in size. Right Atrium: Right atrial size was normal in size. Pericardium: There is no evidence of pericardial effusion. Mitral Valve: The mitral valve is normal in structure. There is mild thickening of the  mitral valve leaflet(s). There is mild calcification of the mitral valve leaflet(s). Mild mitral annular calcification. No evidence of mitral valve regurgitation. No evidence of mitral valve stenosis. Tricuspid Valve: The tricuspid valve is normal in structure. Tricuspid valve regurgitation is trivial. No evidence of tricuspid stenosis. Aortic Valve: History of aortic valve replacement, unknown type. The aortic valve has been repaired/replaced.. There is mild thickening and mild calcification of the aortic valve. Aortic valve regurgitation is not visualized. Mild aortic valve annular calcification. There is mild thickening of the aortic valve. There is mild calcification of the aortic valve. Aortic valve mean gradient measures 21.8 mmHg. Aortic valve peak gradient measures 40.0 mmHg. Aortic valve area, by VTI measures 1.22 cm. Pulmonic Valve: The pulmonic valve was not well visualized. Pulmonic valve regurgitation is not visualized. No evidence of pulmonic stenosis. Aorta: The aortic root is normal in size and structure. Pulmonary Artery: Indeterminant PASP, inadequate TR jet. Venous: The inferior vena cava is normal in size with greater than 50% respiratory variability, suggesting right atrial pressure of 3 mmHg. IAS/Shunts: The interatrial septum was not well visualized.  LEFT VENTRICLE PLAX 2D LVIDd:         4.89 cm  Diastology LVIDs:         2.78 cm  LV e' lateral:   13.80 cm/s LV PW:         1.45 cm  LV E/e' lateral: 7.9 LV IVS:        1.53 cm  LV e' medial:    7.83 cm/s LVOT diam:     2.00 cm  LV E/e' medial:  13.9 LV SV:         79 LV SV Index:  31 LVOT Area:     3.14 cm  RIGHT VENTRICLE RV S prime:     11.00 cm/s TAPSE (M-mode): 1.5 cm LEFT ATRIUM             Index       RIGHT ATRIUM           Index LA diam:        3.80 cm 1.50 cm/m  RA Area:     12.30 cm LA Vol (A2C):   82.7 ml 32.60 ml/m RA Volume:   22.60 ml  8.91 ml/m LA Vol (A4C):   62.1 ml 24.48 ml/m LA Biplane Vol: 77.3 ml 30.47 ml/m  AORTIC  VALVE AV Area (Vmax):    1.15 cm AV Area (Vmean):   1.04 cm AV Area (VTI):     1.22 cm AV Vmax:           316.33 cm/s AV Vmean:          218.927 cm/s AV VTI:            0.648 m AV Peak Grad:      40.0 mmHg AV Mean Grad:      21.8 mmHg LVOT Vmax:         115.71 cm/s LVOT Vmean:        72.264 cm/s LVOT VTI:          0.252 m LVOT/AV VTI ratio: 0.39  AORTA Ao Root diam: 3.40 cm MITRAL VALVE                TRICUSPID VALVE MV Area (PHT): 3.83 cm     TR Peak grad:   21.3 mmHg MV Decel Time: 198 msec     TR Vmax:        231.00 cm/s MV E velocity: 109.00 cm/s MV A velocity: 108.00 cm/s  SHUNTS MV E/A ratio:  1.01         Systemic VTI:  0.25 m                             Systemic Diam: 2.00 cm Carlyle Dolly MD Electronically signed by Carlyle Dolly MD Signature Date/Time: 11/24/2019/12:01:35 PM    Final    US ABDOMEN LIMITED RUQ  Result Date: 11/18/2019 CLINICAL DATA:  Hepatic cirrhosis. EXAM: ULTRASOUND ABDOMEN LIMITED RIGHT UPPER QUADRANT COMPARISON:  March 29, 2019. FINDINGS: Gallbladder: Status post cholecystectomy. Common bile duct: Diameter: 5 mm which is within normal limits. Liver: Heterogeneous echotexture is noted with slightly nodular contours suggesting hepatic cirrhosis. Stable 1 cm right hepatic cyst is noted. Portal vein is patent on color Doppler imaging with normal direction of blood flow towards the liver. Other: None. IMPRESSION: Status post cholecystectomy. Findings consistent with hepatic cirrhosis. Stable small right hepatic cyst. Electronically Signed   By: Marijo Conception M.D.   On: 11/18/2019 15:37      The results of significant diagnostics from this hospitalization (including imaging, microbiology, ancillary and laboratory) are listed below for reference.     Microbiology: Recent Results (from the past 240 hour(s))  SARS Coronavirus 2 by RT PCR (hospital order, performed in [facility] hospital lab) Nasopharyngeal Nasopharyngeal Swab     Status: None   Collection Time:  11/22/19  9:50 PM   Specimen: Nasopharyngeal Swab  Result Value Ref Range Status   SARS Coronavirus 2 NEGATIVE NEGATIVE Final    Comment: (NOTE) SARS-CoV-2 target nucleic acids are NOT DETECTED.  The  SARS-CoV-2 RNA is generally detectable in upper and lower respiratory specimens during the acute phase of infection. The lowest concentration of SARS-CoV-2 viral copies this assay can detect is 250 copies / mL. A negative result does not preclude SARS-CoV-2 infection and should not be used as the sole basis for treatment or other patient management decisions.  A negative result may occur with improper specimen collection / handling, submission of specimen other than nasopharyngeal swab, presence of viral mutation(s) within the areas targeted by this assay, and inadequate number of viral copies (<250 copies / mL). A negative result must be combined with clinical observations, patient history, and epidemiological information.  Fact Sheet for Patients:   StrictlyIdeas.no  Fact Sheet for Healthcare Providers: BankingDealers.co.za  This test is not yet approved or  cleared by the [place] FDA and has been authorized for detection and/or diagnosis of SARS-CoV-2 by FDA under an Emergency Use Authorization (EUA).  This EUA will remain in effect (meaning this test can be used) for the duration of the COVID-19 declaration under Section 564(b)(1) of the Act, 21 U.S.C. section 360bbb-3(b)(1), unless the authorization is terminated or revoked sooner.  Performed at [facility], [address], [place], [place] [address]      Labs: BNP (last 3 results) Recent Labs    11/22/19 2013  BNP 947.0*   Basic Metabolic Panel: Recent Labs  Lab 11/23/19 0527 11/25/19 0557 11/26/19 0849 11/27/19 0755 11/28/19 0730  NA 136 136 134* 136 134*  K 3.5 3.7 3.5 3.5 3.7  CL 100 99 98 101 101  CO2 25 28 25 26 25   GLUCOSE 143* 140* 149* 141* 174*   BUN 22 18 14 14 14   CREATININE 1.35* 1.20 1.22 1.14 1.14  CALCIUM 8.4* 8.6* 8.6* 8.5* 8.5*   Liver Function Tests: Recent Labs  Lab 11/22/19 2013  AST 28  ALT 19  ALKPHOS 162*  BILITOT 1.0  PROT 7.0  ALBUMIN 3.2*   No results for input(s): LIPASE, AMYLASE in the last 168 hours. No results for input(s): AMMONIA in the last 168 hours. CBC: Recent Labs  Lab 11/22/19 2013 11/22/19 2013 11/23/19 0527 11/23/19 1737 11/25/19 0557 11/25/19 1455 11/27/19 0755 11/28/19 0730 11/29/19 0537  WBC 5.7   < > 6.4  --  5.9  --  7.0 6.1 5.2  NEUTROABS 4.1  --   --   --   --   --   --   --   --   HGB 7.7*   < > 8.0*   < > 6.9* 8.0* 8.5* 9.0* 8.7*  HCT 27.1*   < > 28.4*   < > 23.6* 27.8* 29.1* 30.9* 29.5*  MCV 73.6*   < > 73.4*  --  75.2*  --  78.4* 81.5 81.9  PLT 108*   < > 107*  --  101*  --  99* 93* 93*   < > = values in this interval not displayed.   Cardiac Enzymes: No results for input(s): CKTOTAL, CKMB, CKMBINDEX, TROPONINI in the last 168 hours. BNP: Invalid input(s): POCBNP CBG: Recent Labs  Lab 11/28/19 0746 11/28/19 1121 11/28/19 1633 11/28/19 2204 11/29/19 0803  GLUCAP 189* 198* 180* 236* 121*   D-Dimer No results for input(s): DDIMER in the last 72 hours. Hgb A1c No results for input(s): HGBA1C in the last 72 hours. Lipid Profile No results for input(s): CHOL, HDL, LDLCALC, TRIG, CHOLHDL, LDLDIRECT in the last 72 hours. Thyroid function studies No results for input(s): TSH, T4TOTAL,  T3FREE, THYROIDAB in the last 72 hours.  Invalid input(s): FREET3 Anemia work up No results for input(s): VITAMINB12, FOLATE, FERRITIN, TIBC, IRON, RETICCTPCT in the last 72 hours. Urinalysis    Component Value Date/Time   COLORURINE YELLOW 12/21/2017 2120   APPEARANCEUR HAZY (A) 12/21/2017 2120   LABSPEC 1.028 12/21/2017 2120   PHURINE 5.0 12/21/2017 2120   GLUCOSEU 150 (A) 12/21/2017 2120   HGBUR LARGE (A) 12/21/2017 2120   BILIRUBINUR NEGATIVE 12/21/2017 2120    KETONESUR NEGATIVE 12/21/2017 2120   PROTEINUR 30 (A) 12/21/2017 2120   NITRITE NEGATIVE 12/21/2017 2120   LEUKOCYTESUR NEGATIVE 12/21/2017 2120   Sepsis Labs Invalid input(s): PROCALCITONIN,  WBC,  LACTICIDVEN Microbiology Recent Results (from the past 240 hour(s))  SARS Coronavirus 2 by RT PCR (hospital order, performed in [facility] hospital lab) Nasopharyngeal Nasopharyngeal Swab     Status: None   Collection Time: 11/22/19  9:50 PM   Specimen: Nasopharyngeal Swab  Result Value Ref Range Status   SARS Coronavirus 2 NEGATIVE NEGATIVE Final    Comment: (NOTE) SARS-CoV-2 target nucleic acids are NOT DETECTED.  The SARS-CoV-2 RNA is generally detectable in upper and lower respiratory specimens during the acute phase of infection. The lowest concentration of SARS-CoV-2 viral copies this assay can detect is 250 copies / mL. A negative result does not preclude SARS-CoV-2 infection and should not be used as the sole basis for treatment or other patient management decisions.  A negative result may occur with improper specimen collection / handling, submission of specimen other than nasopharyngeal swab, presence of viral mutation(s) within the areas targeted by this assay, and inadequate number of viral copies (<250 copies / mL). A negative result must be combined with clinical observations, patient history, and epidemiological information.  Fact Sheet for Patients:   StrictlyIdeas.no  Fact Sheet for Healthcare Providers: BankingDealers.co.za  This test is not yet approved or  cleared by the [place] FDA and has been authorized for detection and/or diagnosis of SARS-CoV-2 by FDA under an Emergency Use Authorization (EUA).  This EUA will remain in effect (meaning this test can be used) for the duration of the COVID-19 declaration under Section 564(b)(1) of the Act, 21 U.S.C. section 360bbb-3(b)(1), unless the authorization is  terminated or revoked sooner.  Performed at [facility], [address], [place], [place] [address]      Time coordinating discharge:  I have spent 35 minutes face to face with the patient and on the ward discussing the patients care, assessment, plan and disposition with other care givers. >50% of the time was devoted counseling the patient about the risks and benefits of treatment/Discharge disposition and coordinating care.   SIGNED:   Damita Lack, MD  [facility] 11/29/2019, 11:02 AM   If 7PM-7AM, please contact night-coverage

## 2019-11-29 NOTE — Progress Notes (Signed)
Patient c/o chest pain. Patient stated he was having trouble breathing, but was more with exertion with movement.  Patient placed on 2 liters oxygen.  EKG done and normal.  Vitals stable.  Mid-level notified and no new orders received at this time.

## 2019-11-29 NOTE — [place] FL2 (Signed)
MEDICAID FL2 LEVEL OF CARE SCREENING TOOL     IDENTIFICATION  Patient Name: Adam Savage Birthdate: 1950-10-06 Sex: male Admission Date (Current Location): 11/22/2019  [facility] and [place] Number:  [employer] and Address:  [place] [address] [place]      Provider Number: [id number]  Attending Physician Name and Address:  Damita Lack, MD  Relative Name and Phone Number:  [name] (daughter) Ph: [phone]    Current Level of Care: Hospital Recommended Level of Care: [place] Prior Approval Number:    Date Approved/Denied:   PASRR Number:    Discharge Plan: Domiciliary (Rest home) [facility])    Current Diagnoses: Patient Active Problem List   Diagnosis Date Noted  . Rectal bleeding   . Chest pain 11/22/2019  . High cholesterol   . Accidental fall from bed 11/10/2019  . Microcytic anemia 06/17/2019  . Chronic, continuous use of opioids 06/17/2019  . Hepatic cirrhosis due to chronic hepatitis C infection ([place]) 09/16/2018  . Compression fracture of L1 lumbar vertebra ([place]) 06/18/2018  . H/O: GI bleed 06/18/2018  . Dementia associated with other underlying disease with behavioral disturbance ([place])   . S/P AVR (aortic valve replacement)   . Chronic hepatitis C without hepatic coma ([place]) 01/12/2018  . 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 Chronic anticoagulation   . Seizure disorder ([place])   . COPD (chronic obstructive pulmonary disease) ([place])   . Type II diabetes mellitus ([place])   . Obesity 09/21/2015  . OSA (obstructive sleep apnea) 05/05/2015  . Tension headache, chronic 08/25/2013    Orientation RESPIRATION BLADDER Height & Weight     Self, Time, Situation, Place  Normal Continent Weight: (!) 301 lb 9.4 oz (136.8 kg) Height:  6' (182.9 cm)  BEHAVIORAL SYMPTOMS/MOOD NEUROLOGICAL BOWEL NUTRITION STATUS    Convulsions/Seizures Continent Diet (Carb modified)   AMBULATORY STATUS COMMUNICATION OF NEEDS Skin   Limited Assist Verbally Skin abrasions, Other (Comment) (Abrasion: right arm; Ecchymosis: chest, right foot)                       Personal Care Assistance Level of Assistance  Bathing, Feeding, Dressing Bathing Assistance: Independent Feeding assistance: Independent Dressing Assistance: Independent     Functional Limitations Info  Sight, Hearing, Speech Sight Info: Adequate Hearing Info: Impaired Speech Info: Adequate    SPECIAL CARE FACTORS FREQUENCY  PT (By licensed PT)     PT Frequency: Min 2x's/week              Contractures Contractures Info: Not present    Additional Factors Info  Code Status, Allergies, Psychotropic, Insulin Sliding Scale Code Status Info: Partial Allergies Info: Oxycodone; Ativan (Lorazepam); Morphine And Related Psychotropic Info: Lexapro (escitalopram); Seroquel (quetiapine) Insulin Sliding Scale Info: CBG < 70: Implement Hypoglycemia Standing Orders and refer to Hypoglycemia Standing Orders sidebar report; CBG 70-120: 0 units; CBG 121-150: 1 unit; CBG 151-200: 2 units; CBG 201-250: 3 units; CBG 251-300: 5 units; CBG 301-350: 7 units; CBG 351-400: 9 units; CBG > 400: call MD and obtain STAT lab verification       Current Medications (11/29/2019):  This is the current hospital active medication list Current Facility-Administered Medications  Medication Dose Route Frequency Provider Last Rate Last Admin  . 0.9 %  sodium chloride infusion (Manually program via Guardrails IV Fluids)   Intravenous Once [name], MD      .  0.9 %  sodium chloride infusion (Manually program via Guardrails IV Fluids)   Intravenous Once [name], [name], DO      . acetaminophen (TYLENOL) tablet 650 mg  650 mg Oral Q4H PRN [name], MD   650 mg at 11/28/19 1237  . albuterol (PROVENTIL) (2.5 MG/3ML) 0.083% nebulizer solution 2.5 mg  2.5 mg Nebulization Q4H PRN [name], MD       . atorvastatin (LIPITOR) tablet 10 mg  10 mg Oral Daily [name], MD   10 mg at 11/29/19 0933  . escitalopram (LEXAPRO) tablet 10 mg  10 mg Oral QHS [name], MD   10 mg at 11/28/19 2140  . heparin ADULT infusion 100 units/mL (25000 units/252mL sodium chloride 0.45%)  1,600 Units/hr Intravenous Continuous Dana Allan I, MD 16 mL/hr at 11/28/19 1334 1,600 Units/hr at 11/28/19 1334  . HYDROcodone-acetaminophen (NORCO/VICODIN) 5-325 MG per tablet 1 tablet  1 tablet Oral Q8H Kathie Dike, MD   1 tablet at 11/29/19 0527  . insulin aspart (novoLOG) injection 0-9 Units  0-9 Units Subcutaneous TID WC [name], MD   1 Units at 11/29/19 0933  . lactulose (CHRONULAC) 10 GM/15ML solution 10 g  10 g Oral BID [name], MD   10 g at 11/29/19 0935  . levETIRAcetam (KEPPRA) tablet 1,000 mg  1,000 mg Oral BID [name], MD   1,000 mg at 11/29/19 0934  . lidocaine (LIDODERM) 5 % 1 patch  1 patch Transdermal Q24H [name], [name], DO   1 patch at 11/28/19 2307  . memantine (NAMENDA) tablet 5 mg  5 mg Oral BID [name], MD   5 mg at 11/29/19 0933  . ondansetron (ZOFRAN) injection 4 mg  4 mg Intravenous Q6H PRN [name], MD      . pantoprazole (PROTONIX) EC tablet 40 mg  40 mg Oral Daily [name], MD   40 mg at 11/29/19 0934  . QUEtiapine (SEROQUEL) tablet 100 mg  100 mg Oral QHS [name], MD   100 mg at 11/28/19 2139  . QUEtiapine (SEROQUEL) tablet 25 mg  25 mg Oral Daily [name], MD   25 mg at 11/29/19 0933  . spironolactone (ALDACTONE) tablet 50 mg  50 mg Oral Daily [name], MD   50 mg at 11/29/19 0933  . Warfarin - Pharmacist Dosing Inpatient   Does not apply [id number] [name], MD   Given at 11/28/19 1706     Discharge Medications:  TAKE these medications   Advocate Insulin Pen Needles 31G X 5 MM  Misc Generic drug: Insulin Pen Needle 1 Package by Does not apply route 4 (four) times daily.   albuterol 108 (90 Base) MCG/ACT inhaler Commonly known as: VENTOLIN HFA Inhale 2 puffs into the lungs every 4 (four) hours as needed for wheezing or shortness of breath.   atorvastatin 10 MG tablet Commonly known as: LIPITOR Take 1 tablet (10 mg total) by mouth daily.   bacitracin ointment Apply 1 application topically daily.   carvedilol 6.25 MG tablet Commonly known as: COREG Take 1 tablet (6.25 mg total) by mouth 2 (two) times daily with a meal.   chlorhexidine 0.12 % solution Commonly known as: PERIDEX Use as directed 15 mLs in the mouth or throat 2 (two) times daily.   escitalopram 10 MG tablet Commonly known as: LEXAPRO Take 1 tablet (10 mg total) by mouth at bedtime.   ferrous  sulfate 325 (65 FE) MG EC tablet Take 1 tablet (325 mg total) by mouth 2 (two) times daily.   furosemide 80 MG tablet Commonly known as: LASIX Take 80 mg by mouth 2 (two) times daily.   glucose blood test strip Test blood sugar 3 times daily before meals   HYDROcodone-acetaminophen 5-325 MG tablet Commonly known as: NORCO/VICODIN Take 1 tablet by mouth every 12 (twelve) hours as needed for moderate pain.   insulin detemir 100 UNIT/ML injection Commonly known as: Levemir Inject 0.28 mLs (28 Units total) into the skin at bedtime. Increase Levemir by 2 units when the week average is >200.   lactulose 10 GM/15ML solution Commonly known as: CHRONULAC Take 15 mLs (10 g total) by mouth 2 (two) times daily.   Lancets Misc 1 Stick by Does not apply route 3 (three) times daily with meals.   levETIRAcetam 1000 MG tablet Commonly known as: KEPPRA Take 1 tablet (1,000 mg total) by mouth 2 (two) times daily.   memantine 5 MG tablet Commonly known as: NAMENDA Take 5 mg by mouth 2 (two) times daily.   NovoLOG FlexPen 100 UNIT/ML FlexPen Generic drug: insulin aspart INJECT 10 UNITS  INTO THE SKIN 3 (THREE) TIMES DAILY WITH MEALS. ADJUST DOSE AS NEEDED   pantoprazole 40 MG tablet Commonly known as: PROTONIX Take 1 tablet (40 mg total) by mouth daily.   QUEtiapine 25 MG tablet Commonly known as: SEROquel Take 1 tablet in AM   QUEtiapine 100 MG tablet Commonly known as: SEROQUEL TAKE 1 TABLETS IN THE EVENING   spironolactone 50 MG tablet Commonly known as: ALDACTONE Take 50 mg by mouth daily.   warfarin 4 MG tablet Commonly known as: COUMADIN Take as directed. If you are unsure how to take this medication, talk to your nurse or doctor. Original instructions: Take 1 tablet (4 mg total) by mouth daily.        Relevant Imaging Results:  Relevant Lab Results:   Additional Information SS#: [id number]  Shade Flood, LCSW

## 2019-11-29 NOTE — TOC Transition Note (Signed)
Transition of Care [facility]) - CM/SW Discharge Note  Patient Details  Name: Adam Savage MRN: [id number] Date of Birth: 1950-12-13  [facility] CM/SW Contact:  Sherie Don, LCSW Phone Number: 11/29/2019, 10:05 AM  Clinical Narrative: Patient will be discharging back to [facility] with [facility]. CSW updated Cassie with [facility] so that HHPT services can be arranged for patient. CSW updated daughter regarding patient's discharge. CSW called [facility] to set up transportation. [facility] requested that the floor RN call [phone] once the patient is ready for discharge. Information to call for report provided to RN. FL2 completed and faxed to [facility]. TOC signing off.  Final next level of care: Assisted Living Barriers to Discharge: Barriers Resolved  Patient Goals and CMS Choice Patient states their goals for this hospitalization and ongoing recovery are:: Discharge back to [facility] with [facility] CMS Medicare.gov Compare Post Acute Care list provided to:: Patient Represenative (must comment) [name]) Choice offered to / list presented to : Adult Children  Discharge Placement Patient to be transferred to facility by: Daughter or [facility] Name of family member notified: [name] (daughter) Patient and family notified of of transfer: 11/29/19  Discharge Plan and Services In-house Referral: Clinical Social Work Discharge Planning Services: NA Post Acute Care Choice: Home Health          DME Arranged: N/A DME Agency: NA HH Arranged: PT [place] Date HH Agency Contacted: 11/29/19 Time [place]: [phone] Representative spoke with at [place]: Cassie  Readmission Risk Interventions No flowsheet data found.

## 2019-11-29 NOTE — Progress Notes (Signed)
Patient continues to c/o chest pain. Vitals stable.  MD came to floor to to assess patient.  Orders given and carried out. Will continue to monitor patient.

## 2019-11-29 NOTE — Progress Notes (Signed)
Report called and given to [facility]. All questions were answered and no further questions at this time. Patient in stable condition and in no acute distress at time of discharge.

## 2019-11-29 NOTE — Care Management Important Message (Signed)
Important Message  Patient Details  Name: Adam Savage MRN: [id number] Date of Birth: 1950/08/14   Medicare Important Message Given:  Yes     Tommy Medal 11/29/2019, 3:08 PM

## 2019-11-29 NOTE — Progress Notes (Signed)
ANTICOAGULATION CONSULT NOTE -   Pharmacy Consult for Warfarin Indication: Mechanical Valve   Patient Measurements: Height: 6' (182.9 cm) Weight: (!) 136.8 kg (301 lb 9.4 oz) IBW/kg (Calculated) : 77.6 Heparin Dosing Weight: 109 kg  Vital Signs: Temp: 97.5 F (36.4 C) (08/16 0559) Temp Source: Oral (08/16 0559) BP: 126/70 (08/16 0559) Pulse Rate: 78 (08/16 0559)  Labs: Recent Labs    11/27/19 0755 11/27/19 0755 11/28/19 0730 11/28/19 2325 11/29/19 0137 11/29/19 0537 11/29/19 0538  HGB 8.5*   < > 9.0*  --   --  8.7*  --   HCT 29.1*  --  30.9*  --   --  29.5*  --   PLT 99*  --  93*  --   --  93*  --   LABPROT 19.9*  --  25.6*  --   --  37.1*  --   INR 1.8*  --  2.4*  --   --  3.9*  --   HEPARINUNFRC 0.39  --  0.31  --   --   --  0.27*  CREATININE 1.14  --  1.14  --   --   --   --   TROPONINIHS  --   --   --  8 8  --   --    < > = values in this interval not displayed.    Estimated Creatinine Clearance: 87.6 mL/min (by C-G formula based on SCr of 1.14 mg/dL).   Assessment: Patient with chronic warfarin for mechanical valve. Patient with acute blood loss anemia secondary to GI bleed.  GI cleared patient to restart warfarin/heparin bridge.  11/29/19 AM update: Heparin level: 00.27 IU/mL, a little below goal range on heparin at 1600 units/hr INR  now 3.9-->supra-therapeutic CBC: Hb 6.9> 8.0>8.5>9.0>8.7    Plates 101>99>93  (hepatic cirrhosis d/t HCV) RN reports no bleeding complications or issues with infusion site    Goal of Therapy:  HL 0.3-0.7 INR 2-3 Monitor platelets by anticoagulation protocol: Yes   Plan:  Continue heparin infusion rate at 1600 units/hr since INR is now therapeutic  Hold warfarin   tonight  for INR 3.9 Check heparin level daily Monitor CBC and s/s of bleeding   Despina Pole, Pharm. D. Clinical Pharmacist 11/29/2019 9:13 AM

## 2019-11-30 ENCOUNTER — Encounter (HOSPITAL_COMMUNITY): Payer: Self-pay | Admitting: *Deleted

## 2019-11-30 ENCOUNTER — Telehealth: Payer: Self-pay | Admitting: *Deleted

## 2019-11-30 ENCOUNTER — Emergency Department (HOSPITAL_COMMUNITY): Payer: Medicare Other

## 2019-11-30 ENCOUNTER — Emergency Department (HOSPITAL_COMMUNITY)
Admission: EM | Admit: 2019-11-30 | Discharge: 2019-11-30 | Disposition: A | Discharge status: Home / Self Care | Payer: Medicare Other | Attending: Emergency Medicine | Admitting: Emergency Medicine

## 2019-11-30 DIAGNOSIS — I5030 Unspecified diastolic (congestive) heart failure: Secondary | ICD-10-CM | POA: Insufficient documentation

## 2019-11-30 DIAGNOSIS — J449 Chronic obstructive pulmonary disease, unspecified: Secondary | ICD-10-CM | POA: Diagnosis not present

## 2019-11-30 DIAGNOSIS — E119 Type 2 diabetes mellitus without complications: Secondary | ICD-10-CM | POA: Diagnosis not present

## 2019-11-30 DIAGNOSIS — F0390 Unspecified dementia without behavioral disturbance: Secondary | ICD-10-CM | POA: Insufficient documentation

## 2019-11-30 DIAGNOSIS — M6281 Muscle weakness (generalized): Secondary | ICD-10-CM | POA: Diagnosis not present

## 2019-11-30 DIAGNOSIS — R079 Chest pain, unspecified: Secondary | ICD-10-CM | POA: Diagnosis not present

## 2019-11-30 DIAGNOSIS — Z794 Long term (current) use of insulin: Secondary | ICD-10-CM | POA: Insufficient documentation

## 2019-11-30 DIAGNOSIS — Z96652 Presence of left artificial knee joint: Secondary | ICD-10-CM | POA: Diagnosis not present

## 2019-11-30 DIAGNOSIS — I110 Hypertensive heart disease with heart failure: Secondary | ICD-10-CM | POA: Diagnosis not present

## 2019-11-30 DIAGNOSIS — I5032 Chronic diastolic (congestive) heart failure: Secondary | ICD-10-CM | POA: Diagnosis not present

## 2019-11-30 DIAGNOSIS — M545 Low back pain: Secondary | ICD-10-CM | POA: Insufficient documentation

## 2019-11-30 DIAGNOSIS — I69398 Other sequelae of cerebral infarction: Secondary | ICD-10-CM | POA: Diagnosis not present

## 2019-11-30 DIAGNOSIS — Z87891 Personal history of nicotine dependence: Secondary | ICD-10-CM | POA: Diagnosis not present

## 2019-11-30 DIAGNOSIS — R2681 Unsteadiness on feet: Secondary | ICD-10-CM | POA: Diagnosis not present

## 2019-11-30 DIAGNOSIS — I69311 Memory deficit following cerebral infarction: Secondary | ICD-10-CM | POA: Diagnosis not present

## 2019-11-30 DIAGNOSIS — Z7951 Long term (current) use of inhaled steroids: Secondary | ICD-10-CM | POA: Diagnosis not present

## 2019-11-30 DIAGNOSIS — Z7901 Long term (current) use of anticoagulants: Secondary | ICD-10-CM | POA: Insufficient documentation

## 2019-11-30 DIAGNOSIS — J90 Pleural effusion, not elsewhere classified: Secondary | ICD-10-CM | POA: Diagnosis not present

## 2019-11-30 DIAGNOSIS — R0789 Other chest pain: Secondary | ICD-10-CM | POA: Diagnosis not present

## 2019-11-30 LAB — CBC
HCT: 34.2 % — ABNORMAL LOW (ref 39.0–52.0)
Hemoglobin: 10 g/dL — ABNORMAL LOW (ref 13.0–17.0)
MCH: 23.8 pg — ABNORMAL LOW (ref 26.0–34.0)
MCHC: 29.2 g/dL — ABNORMAL LOW (ref 30.0–36.0)
MCV: 81.4 fL (ref 80.0–100.0)
Platelets: 104 10*3/uL — ABNORMAL LOW (ref 150–400)
RBC: 4.2 MIL/uL — ABNORMAL LOW (ref 4.22–5.81)
RDW: 26.3 % — ABNORMAL HIGH (ref 11.5–15.5)
WBC: 9.6 10*3/uL (ref 4.0–10.5)
nRBC: 0 % (ref 0.0–0.2)

## 2019-11-30 LAB — BASIC METABOLIC PANEL
Anion gap: 9 (ref 5–15)
BUN: 20 mg/dL (ref 8–23)
CO2: 27 mmol/L (ref 22–32)
Calcium: 8.7 mg/dL — ABNORMAL LOW (ref 8.9–10.3)
Chloride: 99 mmol/L (ref 98–111)
Creatinine, Ser: 1.51 mg/dL — ABNORMAL HIGH (ref 0.61–1.24)
GFR calc Af Amer: 54 mL/min — ABNORMAL LOW (ref >60)
GFR calc non Af Amer: 46 mL/min — ABNORMAL LOW (ref >60)
Glucose, Bld: 171 mg/dL — ABNORMAL HIGH (ref 70–99)
Potassium: 4.5 mmol/L (ref 3.5–5.1)
Sodium: 135 mmol/L (ref 135–145)

## 2019-11-30 LAB — TROPONIN I (HIGH SENSITIVITY)
Troponin I (High Sensitivity): 8 ng/L (ref <18)
Troponin I (High Sensitivity): 8 ng/L (ref <18)

## 2019-11-30 LAB — PROTIME-INR
INR: 3.5 — ABNORMAL HIGH (ref 0.8–1.2)
Prothrombin Time: 34.3 seconds — ABNORMAL HIGH (ref 11.4–15.2)

## 2019-11-30 MED ORDER — LIDOCAINE 5 % EX PTCH
1.0000 | MEDICATED_PATCH | CUTANEOUS | Status: DC
  Administered 2019-11-30: 1 via TRANSDERMAL
  Filled 2019-11-30: qty 1

## 2019-11-30 NOTE — ED Notes (Signed)
Daughter called and reports that when pt was discharged from hospital back to SNF. Pain meds changed from every 8 to every 12 hours and wonders if pain coming from change in meds

## 2019-11-30 NOTE — Telephone Encounter (Signed)
Adam Savage at [facility] where pt resides states pt is screaming out in pain and grasping his chest, right side more than left, he has refused to go to ED, triage spoke w/ pt and he is now willing to be taken to ED for the chest discomfort. They will take him asap. Do you agree?

## 2019-11-30 NOTE — ED Notes (Signed)
[facility] sending transport for discharge

## 2019-11-30 NOTE — ED Triage Notes (Signed)
Right sided chest pain

## 2019-11-30 NOTE — ED Notes (Signed)
pts daughter updated

## 2019-11-30 NOTE — Discharge Instructions (Addendum)
Today your INR was 3.5.  Your heart enzymes were normal.  Please follow up with your doctor.   The numbing patch needs to be taken off 12 hours after it is put on.  It needs to be off for a full 12 hours before you put a new one on.

## 2019-11-30 NOTE — ED Provider Notes (Signed)
[facility] EMERGENCY DEPARTMENT Provider Note   CSN: [id number] Arrival date & time: 11/30/19  1449     History Chief Complaint  Patient presents with  . Chest Pain    Adam Savage is a 69 y.o. male with past medical history of COPD, dementia, DM, diverticulitis status post partial colectomy, hepatitis C, mechanical heart valve present anticoagulated with warfarin, who presents today for evaluation of chest pain.  Patient was discharged yesterday from the hospital.  Review shows that he was admitted for atypical chest pain and then had lower GI bleeding extending his stay.  He was cleared by cardiology as they felt the chest pain was more atypical in nature.    He states that his pain is waxing and waning.  It is made worse with touch and movement.  Made better with being still.  He, according to chart review, had been reporting chest pain up until his discharge yesterday.  Chart review also shows that his daughter called stating that his pain medication dosing had been changed from every 8 hours to every 12 hours and question if that was contributing to his symptoms.  Patient lives in a assisted living facility.  HPI     Past Medical History:  Diagnosis Date  . COPD (chronic obstructive pulmonary disease) ([place])   . Dementia ([place])   . Depression   . Diabetes mellitus without complication ([place])   . GERD (gastroesophageal reflux disease)   . H/O diverticulitis of colon 11/03/2012   History of partial colectomy in 2011.  Marland Kitchen Hepatitis C   . High cholesterol   . Lower extremity edema   . Mechanical heart valve present   . Overdose of muscle relaxant 08/11/2017  . Pre-diabetes   . Retroperitoneal hematoma 01/29/2018  . S/P AVR (aortic valve replacement)   . Seizures ([place])   . 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 Rectal bleeding   . Chest pain 11/22/2019  . High cholesterol   . Accidental fall from bed 11/10/2019    . Microcytic anemia 06/17/2019  . Chronic, continuous use of opioids 06/17/2019  . Hepatic cirrhosis due to chronic hepatitis C infection ([place]) 09/16/2018  . Compression fracture of L1 lumbar vertebra ([place]) 06/18/2018  . H/O: GI bleed 06/18/2018  . Dementia associated with other underlying disease with behavioral disturbance ([place])   . S/P AVR (aortic valve replacement)   . Chronic hepatitis C without hepatic coma ([place]) 01/12/2018  . 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 Chronic anticoagulation   . Seizure disorder ([place])   . COPD (chronic obstructive pulmonary disease) ([place])   . Type II diabetes mellitus ([place])   . Obesity 09/21/2015  . OSA (obstructive sleep apnea) 05/05/2015  . Tension headache, chronic 08/25/2013    Past Surgical History:  Procedure Laterality Date  . CARDIAC SURGERY    . CHOLECYSTECTOMY    . COLON SURGERY    . COLONOSCOPY WITH PROPOFOL N/A 11/25/2019   Procedure: COLONOSCOPY WITH PROPOFOL;  Surgeon: Rogene Houston, MD;  [facility];  Service: Endoscopy;  Laterality: N/A;  . CORONARY/GRAFT ANGIOGRAPHY N/A 10/31/2017   Procedure: CORONARY/GRAFT ANGIOGRAPHY;  Surgeon: Nelva Bush, MD;  Location: [place] CV LAB;  Service: Cardiovascular;  Laterality: N/A;  . ESOPHAGOGASTRODUODENOSCOPY (EGD) WITH PROPOFOL N/A 01/18/2018   Procedure: ESOPHAGOGASTRODUODENOSCOPY (EGD) WITH PROPOFOL;  Surgeon: Mauri Pole, MD;  Location: [place] [facility];  Service: Endoscopy;  Laterality: N/A;  . JOINT REPLACEMENT    . KNEE SURGERY Left   . POLYPECTOMY  11/25/2019   Procedure: POLYPECTOMY;  Surgeon: Rogene Houston, MD;  Location: [facility];  Service: Endoscopy;;  . tooth pulled  2021       Family History  Problem Relation Age of Onset  . Hypertension Father     Social History   Tobacco Use  . Smoking status: Former Smoker    Packs/day: 1.00    Types: Cigarettes  . Smokeless tobacco: Never Used  Vaping Use  . Vaping Use:  Never used  Substance Use Topics  . Alcohol use: Not Currently  . Drug use: Never    Home Medications Prior to Admission medications   Medication Sig Start Date End Date Taking? Authorizing Provider  albuterol (VENTOLIN HFA) 108 (90 Base) MCG/ACT inhaler Inhale 2 puffs into the lungs every 4 (four) hours as needed for wheezing or shortness of breath. 11/06/18   [name], MD  atorvastatin (LIPITOR) 10 MG tablet Take 1 tablet (10 mg total) by mouth daily. 08/03/19   [name], MD  bacitracin ointment Apply 1 application topically daily. 11/11/19   Kalman Shan Ratliff, DO  carvedilol (COREG) 6.25 MG tablet Take 1 tablet (6.25 mg total) by mouth 2 (two) times daily with a meal. 10/20/19   [name], MD  chlorhexidine (PERIDEX) 0.12 % solution Use as directed 15 mLs in the mouth or throat 2 (two) times daily.    [provider]  escitalopram (LEXAPRO) 10 MG tablet Take 1 tablet (10 mg total) by mouth at bedtime. 11/10/19   [name], MD  ferrous sulfate 325 (65 FE) MG EC tablet Take 1 tablet (325 mg total) by mouth 2 (two) times daily. 11/23/19 11/22/20  Kathie Dike, MD  furosemide (LASIX) 80 MG tablet Take 80 mg by mouth 2 (two) times daily. 10/28/19   [provider]  glucose blood test strip Test blood sugar 3 times daily before meals 06/22/19   [name], MD  HYDROcodone-acetaminophen (NORCO/VICODIN) 5-325 MG tablet Take 1 tablet by mouth every 12 (twelve) hours as needed for moderate pain. 11/29/19   Amin, Ankit Chirag, MD  insulin aspart (NOVOLOG FLEXPEN) 100 UNIT/ML FlexPen INJECT 10 UNITS INTO THE SKIN 3 (THREE) TIMES DAILY WITH MEALS. ADJUST DOSE AS NEEDED 03/19/19   [name], MD  insulin detemir (LEVEMIR) 100 UNIT/ML injection Inject 0.28 mLs (28 Units total) into the skin at bedtime. Increase Levemir by 2 units when the week average is >200. 11/06/18   [name], MD  Insulin Pen Needle (ADVOCATE INSULIN PEN NEEDLES) 31G X 5 MM MISC 1 Package by  Does not apply route 4 (four) times daily. 02/03/19   [name], MD  lactulose (CHRONULAC) 10 GM/15ML solution Take 15 mLs (10 g total) by mouth 2 (two) times daily. 06/15/19   [name], MD  Lancets MISC 1 Stick by Does not apply route 3 (three) times daily with meals. 06/22/19   [name], MD  levETIRAcetam (KEPPRA) 1000 MG tablet Take 1 tablet (1,000 mg total) by mouth 2 (two) times daily. 09/17/18   Cameron Sprang, MD  memantine (NAMENDA) 5 MG tablet Take 5 mg by mouth 2 (two) times daily.    [provider]  pantoprazole (PROTONIX) 40 MG tablet Take 1 tablet (40 mg total) by mouth daily. 07/14/18   [name], MD  QUEtiapine (SEROQUEL) 100  MG tablet TAKE 1 TABLETS IN THE EVENING 11/10/19   [name], MD  QUEtiapine (SEROQUEL) 25 MG tablet Take 1 tablet in AM 08/16/19   Cameron Sprang, MD  spironolactone (ALDACTONE) 50 MG tablet Take 50 mg by mouth daily.    [provider]  warfarin (COUMADIN) 4 MG tablet Take 1 tablet (4 mg total) by mouth daily. 07/14/18   [name], MD    Allergies    Oxycodone, Ativan [lorazepam], and Morphine and related  Review of Systems   Review of Systems  Respiratory: Negative for cough and shortness of breath.   Cardiovascular: Positive for chest pain. Negative for palpitations.  Gastrointestinal: Negative for abdominal pain.  Musculoskeletal: Positive for back pain.  All other systems reviewed and are negative.   Physical Exam Updated Vital Signs BP 98/63 (BP Location: Right Arm)   Pulse (!) 56   Temp 98.4 F (36.9 C) (Oral)   Resp 16   Ht 6' (1.829 m)   Wt (!) 137.4 kg   SpO2 99%   BMI 41.09 kg/m   Physical Exam Vitals and nursing note reviewed.  Constitutional:      Appearance: He is well-developed.  HENT:     Head: Normocephalic and atraumatic.  Eyes:     Conjunctiva/sclera: Conjunctivae normal.  Cardiovascular:     Rate and Rhythm: Normal rate and regular rhythm.     Heart sounds: No murmur heard.       Comments: Mechanical valve click heard Pulmonary:     Effort: Pulmonary effort is normal. No respiratory distress.     Breath sounds: Normal breath sounds. No decreased breath sounds.  Chest:     Chest wall: Tenderness (Chest pain is both easily recreated and exacerbated with palpation.) present.  Abdominal:     Palpations: Abdomen is soft.     Tenderness: There is no abdominal tenderness.  Musculoskeletal:     Cervical back: Neck supple.  Skin:    General: Skin is warm and dry.  Neurological:     General: No focal deficit present.     Mental Status: He is alert.     Cranial Nerves: No cranial nerve deficit.  Psychiatric:        Mood and Affect: Mood normal.        Behavior: Behavior normal.     ED Results / Procedures / Treatments   Labs (all labs ordered are listed, but only abnormal results are displayed) Labs Reviewed  BASIC METABOLIC PANEL - Abnormal; Notable for the following components:      Result Value   Glucose, Bld 171 (*)    Creatinine, Ser 1.51 (*)    Calcium 8.7 (*)    GFR calc non Af Amer 46 (*)    GFR calc Af Amer 54 (*)    All other components within normal limits  CBC - Abnormal; Notable for the following components:   RBC 4.20 (*)    Hemoglobin 10.0 (*)    HCT 34.2 (*)    MCH 23.8 (*)    MCHC 29.2 (*)    RDW 26.3 (*)    Platelets 104 (*)    All other components within normal limits  PROTIME-INR - Abnormal; Notable for the following components:   Prothrombin Time 34.3 (*)    INR 3.5 (*)    All other components within normal limits  TROPONIN I (HIGH SENSITIVITY)  TROPONIN I (HIGH SENSITIVITY)    EKG None  Radiology DG Chest 2  View  Result Date: 11/30/2019 CLINICAL DATA:  69 year old male with history of chest pain. History of valve replacement. EXAM: CHEST - 2 VIEW COMPARISON:  Chest x-ray 11/22/2019. FINDINGS: Lung volumes are normal. No consolidative airspace disease. No pleural effusions. No pneumothorax. No pulmonary nodule or mass  noted. Pulmonary vasculature and the cardiomediastinal silhouette are within normal limits. Atherosclerosis in the thoracic aorta. Status post median sternotomy for aortic valve replacement with mechanical aortic valve. Numerous bilateral healed rib fractures are incidentally noted. IMPRESSION: 1.  No radiographic evidence of acute cardiopulmonary disease. 2. Aortic atherosclerosis. 3. Postoperative changes and additional incidental findings, as above. Electronically Signed   By: Vinnie Langton M.D.   On: 11/30/2019 16:03    Procedures Procedures (including critical care time)  Medications Ordered in ED Medications  lidocaine (LIDODERM) 5 % 1 patch (has no administration in time range)    ED Course  I have reviewed the triage vital signs and the nursing notes.  Pertinent labs & imaging results that were available during my care of the patient were reviewed by me and considered in my medical decision making (see chart for details).    MDM Rules/Calculators/A&P                         Patient is a 69 year old man who presents today for evaluation of chest pain.  He was discharged from the hospital yesterday after he had been admitted for atypical chest pain.  Cardiology did not feel that his pain was cardiac in nature.  Here today he has tenderness to palpation of the chest wall which recreates and exacerbates his reported pain.  This appears consistent with his reported pain on notes from his admission.  Here he is afebrile, not tachycardic or tachypneic and at 99% on room air.  Chest x-ray is reassuring.  Troponin x2 is not elevated.  INR is 3.5.    Lidocaine patch is ordered.  Patient was recently seen by cardiology which is reassuring.  Given that he is anticoagulated with normal INR is and not hypoxic doubt PE especially given how recreatable his chest pain is.  No evidence of dissection, pneumothorax or consolidation.  Doubt ACS.  Return precautions were discussed with patient who  states their understanding.  At the time of discharge patient denied any unaddressed complaints or concerns.  Patient is agreeable for discharge home.  Note: Portions of this report may have been transcribed using voice recognition software. Every effort was made to ensure accuracy; however, inadvertent computerized transcription errors may be present  Final Clinical Impression(s) / ED Diagnoses Final diagnoses:  Chest wall pain  Atypical chest pain    Rx / DC Orders ED Discharge Orders    None       Ollen Gross 11/30/19 2043    Margette Fast, MD 12/06/19 1135

## 2019-11-30 NOTE — Telephone Encounter (Signed)
Need clarification on what day to draw INR

## 2019-11-30 NOTE — Telephone Encounter (Signed)
Yes, I agree with that plan.Thank you!

## 2019-11-30 NOTE — Telephone Encounter (Signed)
Spoke with Adam Savage.  Instructed to do POC INR on Thursday 8/19 and BMP/CBC on 12/06/19.  She verbalized understanding.

## 2019-12-01 ENCOUNTER — Other Ambulatory Visit: Payer: Self-pay | Admitting: Internal Medicine

## 2019-12-01 NOTE — Telephone Encounter (Signed)
Pt's daughter calls and ask that dr Truman Hayward review pain med and change back to the order from before pt went into hosp, pt is crying out due to pain and the hydrocodone 1 tablet every 6 hrs, the 1 tablet every 12 hrs is not working. Daughter feels that this is why he ended up in ED yesterday.

## 2019-12-01 NOTE — Telephone Encounter (Signed)
Pt daughter is requesting a call back [phone]

## 2019-12-01 NOTE — Telephone Encounter (Signed)
Spoke w/ pt's daughter, she thinks the patches along with the hydrocodone might help and is willing to try them. She wasn't asking to increase them just to go back to the #60 per month.

## 2019-12-01 NOTE — Telephone Encounter (Signed)
Please call pt's daughter back.  

## 2019-12-01 NOTE — Telephone Encounter (Signed)
Hello! Per chart review, patient receives #60 tablets of Norco per every 30 days. Given his numerous falls and known dementia, would not advise to increase opioid frequency but pursue non-opioid pain management including lidocaine patch. Let me know if they need an order for that. Thank you!

## 2019-12-02 ENCOUNTER — Ambulatory Visit (INDEPENDENT_AMBULATORY_CARE_PROVIDER_SITE_OTHER): Payer: Medicare Other | Admitting: Internal Medicine

## 2019-12-02 ENCOUNTER — Telehealth: Payer: Self-pay | Admitting: *Deleted

## 2019-12-02 ENCOUNTER — Ambulatory Visit (INDEPENDENT_AMBULATORY_CARE_PROVIDER_SITE_OTHER): Payer: Medicare Other | Admitting: *Deleted

## 2019-12-02 ENCOUNTER — Other Ambulatory Visit: Payer: Self-pay

## 2019-12-02 DIAGNOSIS — I5032 Chronic diastolic (congestive) heart failure: Secondary | ICD-10-CM | POA: Diagnosis not present

## 2019-12-02 DIAGNOSIS — R0789 Other chest pain: Secondary | ICD-10-CM

## 2019-12-02 DIAGNOSIS — I110 Hypertensive heart disease with heart failure: Secondary | ICD-10-CM | POA: Diagnosis not present

## 2019-12-02 DIAGNOSIS — M6281 Muscle weakness (generalized): Secondary | ICD-10-CM | POA: Diagnosis not present

## 2019-12-02 DIAGNOSIS — S32010S Wedge compression fracture of first lumbar vertebra, sequela: Secondary | ICD-10-CM

## 2019-12-02 DIAGNOSIS — I69398 Other sequelae of cerebral infarction: Secondary | ICD-10-CM | POA: Diagnosis not present

## 2019-12-02 DIAGNOSIS — K625 Hemorrhage of anus and rectum: Secondary | ICD-10-CM | POA: Diagnosis not present

## 2019-12-02 DIAGNOSIS — I69311 Memory deficit following cerebral infarction: Secondary | ICD-10-CM | POA: Diagnosis not present

## 2019-12-02 DIAGNOSIS — R2681 Unsteadiness on feet: Secondary | ICD-10-CM | POA: Diagnosis not present

## 2019-12-02 LAB — POCT INR: INR: 2.6 (ref 2.0–3.0)

## 2019-12-02 MED ORDER — LIDOCAINE 5 % EX PTCH: 1.0000 | MEDICATED_PATCH | Freq: Two times a day (BID) | CUTANEOUS | 0 refills | Status: AC

## 2019-12-02 MED ORDER — HYDROCODONE-ACETAMINOPHEN 5-325 MG PO TABS: 1.0000 | ORAL_TABLET | Freq: Three times a day (TID) | ORAL | 0 refills | Status: DC | PRN

## 2019-12-02 NOTE — Telephone Encounter (Signed)
Dr Charleen Kirks will you please call Adam Savage's daughter, PH# [phone]

## 2019-12-02 NOTE — Progress Notes (Signed)
  [facility] Telephone Encounter Continuity Care Appointment  HPI:   This telephone encounter was created for [name] on 12/02/2019 for the following purpose/cc hospital follow up and atypical chest pain.   Past Medical History:  Past Medical History:  Diagnosis Date  . COPD (chronic obstructive pulmonary disease) ([place])   . Dementia ([place])   . Depression   . Diabetes mellitus without complication ([place])   . GERD (gastroesophageal reflux disease)   . H/O diverticulitis of colon 11/03/2012   History of partial colectomy in 2011.  Marland Kitchen Hepatitis C   . High cholesterol   . Lower extremity edema   . Mechanical heart valve present   . Overdose of muscle relaxant 08/11/2017  . Pre-diabetes   . Retroperitoneal hematoma 01/29/2018  . S/P AVR (aortic valve replacement)   . Seizures ([place])   . 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    Assessment / Plan / Recommendations:   Please see A&P under problem oriented charting for assessment of the patient's acute and chronic medical conditions.   As always, pt is advised that if symptoms worsen or new symptoms arise, they should go to an urgent care facility or to to ER for further evaluation.   Consent and Medical Decision Making:   Patient discussed with Dr. Angelia Mould  This is a telephone encounter between [name] and Jose Persia on 12/02/2019 for atypical chest pain. The visit was conducted with the patient located at home and Jose Persia at [facility]. The patient's identity was confirmed using their DOB and current address. The patient has consented to being evaluated through a telephone encounter and understands the associated risks (an examination cannot be done and the patient may need to come in for an appointment) / benefits (allows the patient to remain at home, decreasing exposure to coronavirus). I personally spent 15 minutes on medical discussion.

## 2019-12-02 NOTE — Patient Instructions (Signed)
Continue warfarin 1 tablet daily. Order given to  Katherine Basset RN [facility] INR in 2 weeks  Faxed order to [place] [phone]

## 2019-12-02 NOTE — Telephone Encounter (Signed)
Daughter contact and plan discussed

## 2019-12-02 NOTE — Assessment & Plan Note (Addendum)
CP didn't start till once he was hospitalized. Feels like it is muscular. Dull pain. Right side of sternum without radiation. Exacerbating factors includes moving around in the bed; moving side to side.   No SOB, palpitations.   Assessment/Plan:  - Lidocaine patches

## 2019-12-05 NOTE — Assessment & Plan Note (Signed)
Adam Savage states he may have noticed some black stool once or twice in the past few days. He cannot recall the timing or severity. His ALF RN in the room states they have not noticed any black stool so far since discharge from the hospital.   Assessment/plan: Adam Savage had a recent hospitalization for atypical chest pain during which he was noted to have bright red blood per rectum.  Hemoglobin was evaluated and noted to have decreased.  No previous history of a colonoscopy although patient did have a EGD in the past that showed esophagitis with Barrett's esophagus and a Mallory-Weiss tear.  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  Patient recovered well and was discharged.  INR was checked while we were on the phone together and within goal at 2.6.  Next CBCs on Tuesday the 24th.  Most likely residual from recent colonoscopy with multiple clippings.  Patient's vitals has been noted to be stable since returning to ALF.  -Recommended patient contact GI office to schedule immediate follow-up -Instructed nursing to please keep an exam in all stools from here on out until next CBC -CBC on the 24th

## 2019-12-05 NOTE — Assessment & Plan Note (Signed)
[name] has reportedly been crying out in pain since his fall from bed with pain mostly localized in the chest, however nursing and daughter are concerned about the relatively rapid decrease in the timing of his Vicodin.  He was initially taking every 6 hours at maximum of 2 pills/day, which was decreased to every 8 hours by her clinic and then to every 12 hours in the hospital.  Patient denies any overt drowsiness when taking his medication but feels it just controls his pain.  Assessment/plan: -Adjusted frequency to Vicodin 5-325 mg every 8 hours as needed at maximum of 2 tablets/day

## 2019-12-06 ENCOUNTER — Ambulatory Visit: Payer: Medicare Other | Admitting: Internal Medicine

## 2019-12-06 DIAGNOSIS — I5032 Chronic diastolic (congestive) heart failure: Secondary | ICD-10-CM | POA: Diagnosis not present

## 2019-12-06 DIAGNOSIS — R2681 Unsteadiness on feet: Secondary | ICD-10-CM | POA: Diagnosis not present

## 2019-12-06 DIAGNOSIS — I69311 Memory deficit following cerebral infarction: Secondary | ICD-10-CM | POA: Diagnosis not present

## 2019-12-06 DIAGNOSIS — M6281 Muscle weakness (generalized): Secondary | ICD-10-CM | POA: Diagnosis not present

## 2019-12-06 DIAGNOSIS — I69398 Other sequelae of cerebral infarction: Secondary | ICD-10-CM | POA: Diagnosis not present

## 2019-12-06 DIAGNOSIS — I110 Hypertensive heart disease with heart failure: Secondary | ICD-10-CM | POA: Diagnosis not present

## 2019-12-07 ENCOUNTER — Telehealth: Payer: Self-pay | Admitting: *Deleted

## 2019-12-07 ENCOUNTER — Ambulatory Visit: Payer: Medicare Other | Admitting: Interventional Cardiology

## 2019-12-07 NOTE — Telephone Encounter (Signed)
Results reviewed. Hemoglobin noted to be stable.

## 2019-12-07 NOTE — Progress Notes (Signed)
Internal Medicine Clinic Attending  Case discussed with Dr. Basaraba  At the time of the visit.  We reviewed the resident's history and exam and pertinent patient test results.  I agree with the assessment, diagnosis, and plan of care documented in the resident's note.  

## 2019-12-07 NOTE — Telephone Encounter (Signed)
Received faxed results of CBC/BMP from [facility]. Given to Yellow Team to review. Hubbard Hartshorn, BSN, RN-BC

## 2019-12-09 DIAGNOSIS — F0391 Unspecified dementia with behavioral disturbance: Secondary | ICD-10-CM | POA: Diagnosis not present

## 2019-12-09 DIAGNOSIS — I5032 Chronic diastolic (congestive) heart failure: Secondary | ICD-10-CM | POA: Diagnosis not present

## 2019-12-09 DIAGNOSIS — I69398 Other sequelae of cerebral infarction: Secondary | ICD-10-CM | POA: Diagnosis not present

## 2019-12-09 DIAGNOSIS — J449 Chronic obstructive pulmonary disease, unspecified: Secondary | ICD-10-CM | POA: Diagnosis not present

## 2019-12-09 DIAGNOSIS — R2681 Unsteadiness on feet: Secondary | ICD-10-CM | POA: Diagnosis not present

## 2019-12-09 DIAGNOSIS — S51802A Unspecified open wound of left forearm, initial encounter: Secondary | ICD-10-CM | POA: Diagnosis not present

## 2019-12-09 DIAGNOSIS — S51811A Laceration without foreign body of right forearm, initial encounter: Secondary | ICD-10-CM | POA: Diagnosis not present

## 2019-12-09 DIAGNOSIS — Z794 Long term (current) use of insulin: Secondary | ICD-10-CM | POA: Diagnosis not present

## 2019-12-09 DIAGNOSIS — Z87891 Personal history of nicotine dependence: Secondary | ICD-10-CM | POA: Diagnosis not present

## 2019-12-09 DIAGNOSIS — E119 Type 2 diabetes mellitus without complications: Secondary | ICD-10-CM | POA: Diagnosis not present

## 2019-12-09 DIAGNOSIS — Z5181 Encounter for therapeutic drug level monitoring: Secondary | ICD-10-CM | POA: Diagnosis not present

## 2019-12-09 DIAGNOSIS — M6281 Muscle weakness (generalized): Secondary | ICD-10-CM | POA: Diagnosis not present

## 2019-12-09 DIAGNOSIS — I110 Hypertensive heart disease with heart failure: Secondary | ICD-10-CM | POA: Diagnosis not present

## 2019-12-09 DIAGNOSIS — Z7901 Long term (current) use of anticoagulants: Secondary | ICD-10-CM | POA: Diagnosis not present

## 2019-12-09 DIAGNOSIS — I69311 Memory deficit following cerebral infarction: Secondary | ICD-10-CM | POA: Diagnosis not present

## 2019-12-13 ENCOUNTER — Ambulatory Visit (INDEPENDENT_AMBULATORY_CARE_PROVIDER_SITE_OTHER): Payer: Medicare Other | Admitting: *Deleted

## 2019-12-13 ENCOUNTER — Ambulatory Visit (INDEPENDENT_AMBULATORY_CARE_PROVIDER_SITE_OTHER): Payer: Medicare Other | Admitting: Internal Medicine

## 2019-12-13 ENCOUNTER — Other Ambulatory Visit: Payer: Self-pay

## 2019-12-13 ENCOUNTER — Encounter: Payer: Self-pay | Admitting: Internal Medicine

## 2019-12-13 VITALS — BP 103/58 | HR 60 | Temp 98.2°F

## 2019-12-13 DIAGNOSIS — S41102D Unspecified open wound of left upper arm, subsequent encounter: Secondary | ICD-10-CM

## 2019-12-13 DIAGNOSIS — Z5181 Encounter for therapeutic drug level monitoring: Secondary | ICD-10-CM | POA: Diagnosis not present

## 2019-12-13 DIAGNOSIS — Z952 Presence of prosthetic heart valve: Secondary | ICD-10-CM | POA: Diagnosis not present

## 2019-12-13 DIAGNOSIS — S41101D Unspecified open wound of right upper arm, subsequent encounter: Secondary | ICD-10-CM | POA: Diagnosis not present

## 2019-12-13 DIAGNOSIS — R2681 Unsteadiness on feet: Secondary | ICD-10-CM | POA: Diagnosis not present

## 2019-12-13 DIAGNOSIS — S51802A Unspecified open wound of left forearm, initial encounter: Secondary | ICD-10-CM | POA: Diagnosis not present

## 2019-12-13 DIAGNOSIS — J449 Chronic obstructive pulmonary disease, unspecified: Secondary | ICD-10-CM | POA: Diagnosis not present

## 2019-12-13 DIAGNOSIS — M6281 Muscle weakness (generalized): Secondary | ICD-10-CM | POA: Diagnosis not present

## 2019-12-13 DIAGNOSIS — I69398 Other sequelae of cerebral infarction: Secondary | ICD-10-CM | POA: Diagnosis not present

## 2019-12-13 DIAGNOSIS — S51811A Laceration without foreign body of right forearm, initial encounter: Secondary | ICD-10-CM | POA: Diagnosis not present

## 2019-12-13 LAB — POCT INR: INR: 2.4 (ref 2.0–3.0)

## 2019-12-13 NOTE — Patient Instructions (Signed)
Continue warfarin 1 tablet daily. Order given to  Katherine Basset RN [facility] INR in 2 weeks  Faxed order to [facility] [phone]

## 2019-12-13 NOTE — Progress Notes (Signed)
   Subjective:     Patient ID: Adam Savage, male    DOB: 04/10/1951, 69 y.o.   MRN: [id number]  Chief Complaint  Patient presents with  . Follow-up bilateral upper extremity wounds    HPI: The patient is a 69 y.o. male here for follow-up bilateral upper extremity wounds.  Patient currently resides in an assisted living facility and is accompanied by a caregiver.  [facility] has been doing daily dressing changes with silver collagen to the right upper extremity wound and bacitracin ointment over the sutured incisions.  For the left arm wound he is also placing bacitracin ointment over the sutured incision.  He denies acute pain, purulent drainage, increased warmth or erythema to the wound sites.  Review of Systems  All other systems reviewed and are negative.    has a past medical history of COPD (chronic obstructive pulmonary disease) ([place]), Dementia ([place]), Depression, Diabetes mellitus without complication ([place]), GERD (gastroesophageal reflux disease), H/O diverticulitis of colon (11/03/2012), Hepatitis C, High cholesterol, Lower extremity edema, Mechanical heart valve present, Overdose of muscle relaxant (08/11/2017), Pre-diabetes, Retroperitoneal hematoma (01/29/2018), S/P AVR (aortic valve replacement), Seizures ([place]), and Status post lobectomy of brain (06/28/2013).  has a past surgical history that includes Cardiac surgery; Knee surgery (Left); Joint replacement; Colon surgery; CORONARY/GRAFT ANGIOGRAPHY (N/A, 10/31/2017); Cholecystectomy; Esophagogastroduodenoscopy (egd) with propofol (N/A, 01/18/2018); tooth pulled (2021); Colonoscopy with propofol (N/A, 11/25/2019); and polypectomy (11/25/2019).  reports that he has quit smoking. His smoking use included cigarettes. He smoked 1.00 pack per day. He has never used smokeless tobacco. Objective:   Vital Signs BP (!) 103/58 (BP Location: Left Arm, Patient Position: Sitting, Cuff Size: Large)   Pulse 60   Temp 98.2 F (36.8 C) (Oral)   SpO2 99%    Vital Signs and Nursing Note Reviewed Physical Exam Skin:    Comments: Right forearm wound measures:  2.0 x 5.0 x 0.1 cm Granulation tissue in the wound bed Left forearm wound epithelialized            Assessment/Plan:     ICD-10-CM   1. Arm wound, left, subsequent encounter  S41.102D   2. Arm wound, right, subsequent encounter  S41.101D    Assessment: Lacerations bilaterally following a fall s/p sutures, with open wound to the right forearm  Sutures were removed today and wounds look well-healing with no signs of infection.  I recommended he use Vaseline and a Band-Aid to his left arm wound.  To the right arm wound I recommended continuing silver collagen daily with nonstick pad and Kerlix.  Plan -Sutures removed -Wound cleansed and dressed with Vaseline to both arms.  Left arm with a Band-Aid and right arm with a nonstick pad and Kerlix. -Silver collagen daily with dressing changes to the right arm -Vaseline and Band-Aid daily dressing changes to the left arm -New orders given to assisted living facility -Follow-up in 3 weeks  Boyd Kerbs, DO 12/13/2019, 11:01 AM

## 2019-12-16 ENCOUNTER — Telehealth: Payer: Self-pay | Admitting: *Deleted

## 2019-12-16 NOTE — Telephone Encounter (Signed)
Received physician orders from [facility] requesting signature and return.    Orders signed and faxed back on (12/15/19) to [facility].  Confirmation received and copy scanned into the chart.//AB/CMA

## 2019-12-17 DIAGNOSIS — S51811A Laceration without foreign body of right forearm, initial encounter: Secondary | ICD-10-CM | POA: Diagnosis not present

## 2019-12-17 DIAGNOSIS — R2681 Unsteadiness on feet: Secondary | ICD-10-CM | POA: Diagnosis not present

## 2019-12-17 DIAGNOSIS — J449 Chronic obstructive pulmonary disease, unspecified: Secondary | ICD-10-CM | POA: Diagnosis not present

## 2019-12-17 DIAGNOSIS — M6281 Muscle weakness (generalized): Secondary | ICD-10-CM | POA: Diagnosis not present

## 2019-12-17 DIAGNOSIS — I69398 Other sequelae of cerebral infarction: Secondary | ICD-10-CM | POA: Diagnosis not present

## 2019-12-17 DIAGNOSIS — S51802A Unspecified open wound of left forearm, initial encounter: Secondary | ICD-10-CM | POA: Diagnosis not present

## 2019-12-21 DIAGNOSIS — I69398 Other sequelae of cerebral infarction: Secondary | ICD-10-CM | POA: Diagnosis not present

## 2019-12-21 DIAGNOSIS — M6281 Muscle weakness (generalized): Secondary | ICD-10-CM | POA: Diagnosis not present

## 2019-12-21 DIAGNOSIS — R2681 Unsteadiness on feet: Secondary | ICD-10-CM | POA: Diagnosis not present

## 2019-12-21 DIAGNOSIS — S51802A Unspecified open wound of left forearm, initial encounter: Secondary | ICD-10-CM | POA: Diagnosis not present

## 2019-12-21 DIAGNOSIS — S51811A Laceration without foreign body of right forearm, initial encounter: Secondary | ICD-10-CM | POA: Diagnosis not present

## 2019-12-21 DIAGNOSIS — J449 Chronic obstructive pulmonary disease, unspecified: Secondary | ICD-10-CM | POA: Diagnosis not present

## 2019-12-22 DIAGNOSIS — S51811A Laceration without foreign body of right forearm, initial encounter: Secondary | ICD-10-CM | POA: Diagnosis not present

## 2019-12-22 DIAGNOSIS — M6281 Muscle weakness (generalized): Secondary | ICD-10-CM | POA: Diagnosis not present

## 2019-12-22 DIAGNOSIS — J449 Chronic obstructive pulmonary disease, unspecified: Secondary | ICD-10-CM | POA: Diagnosis not present

## 2019-12-22 DIAGNOSIS — I69398 Other sequelae of cerebral infarction: Secondary | ICD-10-CM | POA: Diagnosis not present

## 2019-12-22 DIAGNOSIS — S51802A Unspecified open wound of left forearm, initial encounter: Secondary | ICD-10-CM | POA: Diagnosis not present

## 2019-12-22 DIAGNOSIS — R2681 Unsteadiness on feet: Secondary | ICD-10-CM | POA: Diagnosis not present

## 2019-12-27 DIAGNOSIS — M6281 Muscle weakness (generalized): Secondary | ICD-10-CM | POA: Diagnosis not present

## 2019-12-27 DIAGNOSIS — S51802A Unspecified open wound of left forearm, initial encounter: Secondary | ICD-10-CM | POA: Diagnosis not present

## 2019-12-27 DIAGNOSIS — S51811A Laceration without foreign body of right forearm, initial encounter: Secondary | ICD-10-CM | POA: Diagnosis not present

## 2019-12-27 DIAGNOSIS — I69398 Other sequelae of cerebral infarction: Secondary | ICD-10-CM | POA: Diagnosis not present

## 2019-12-27 DIAGNOSIS — R2681 Unsteadiness on feet: Secondary | ICD-10-CM | POA: Diagnosis not present

## 2019-12-27 DIAGNOSIS — J449 Chronic obstructive pulmonary disease, unspecified: Secondary | ICD-10-CM | POA: Diagnosis not present

## 2019-12-28 ENCOUNTER — Ambulatory Visit (INDEPENDENT_AMBULATORY_CARE_PROVIDER_SITE_OTHER): Payer: Medicare Other | Admitting: Cardiology

## 2019-12-28 DIAGNOSIS — S51802A Unspecified open wound of left forearm, initial encounter: Secondary | ICD-10-CM | POA: Diagnosis not present

## 2019-12-28 DIAGNOSIS — R2681 Unsteadiness on feet: Secondary | ICD-10-CM | POA: Diagnosis not present

## 2019-12-28 DIAGNOSIS — Z5181 Encounter for therapeutic drug level monitoring: Secondary | ICD-10-CM

## 2019-12-28 DIAGNOSIS — S51811A Laceration without foreign body of right forearm, initial encounter: Secondary | ICD-10-CM | POA: Diagnosis not present

## 2019-12-28 DIAGNOSIS — J449 Chronic obstructive pulmonary disease, unspecified: Secondary | ICD-10-CM | POA: Diagnosis not present

## 2019-12-28 DIAGNOSIS — I69398 Other sequelae of cerebral infarction: Secondary | ICD-10-CM | POA: Diagnosis not present

## 2019-12-28 DIAGNOSIS — M6281 Muscle weakness (generalized): Secondary | ICD-10-CM | POA: Diagnosis not present

## 2019-12-28 LAB — POCT INR: INR: 2.1 (ref 2.0–3.0)

## 2019-12-28 NOTE — Patient Instructions (Signed)
Description   Continue warfarin 1 tablet daily. Order given to  Katherine Basset RN [facility] INR in 3 weeks.   Faxed order to [facility] [phone]

## 2019-12-29 DIAGNOSIS — R2681 Unsteadiness on feet: Secondary | ICD-10-CM | POA: Diagnosis not present

## 2019-12-29 DIAGNOSIS — S51811A Laceration without foreign body of right forearm, initial encounter: Secondary | ICD-10-CM | POA: Diagnosis not present

## 2019-12-29 DIAGNOSIS — S51802A Unspecified open wound of left forearm, initial encounter: Secondary | ICD-10-CM | POA: Diagnosis not present

## 2019-12-29 DIAGNOSIS — I69398 Other sequelae of cerebral infarction: Secondary | ICD-10-CM | POA: Diagnosis not present

## 2019-12-29 DIAGNOSIS — M6281 Muscle weakness (generalized): Secondary | ICD-10-CM | POA: Diagnosis not present

## 2019-12-29 DIAGNOSIS — J449 Chronic obstructive pulmonary disease, unspecified: Secondary | ICD-10-CM | POA: Diagnosis not present

## 2020-01-03 ENCOUNTER — Ambulatory Visit: Payer: Medicare Other | Admitting: Internal Medicine

## 2020-01-03 ENCOUNTER — Telehealth: Payer: Self-pay | Admitting: *Deleted

## 2020-01-03 ENCOUNTER — Telehealth: Payer: Self-pay | Admitting: Internal Medicine

## 2020-01-03 NOTE — Telephone Encounter (Signed)
Pt's daughter stated she got a call from her dad that he's having black stools x 2-3 days. He lives at [facility] in [place]. They took a picture; the daughter called the [facility] nurse,who sees pt weekly to draw INR, to inform her. After seeing the pic, she did think he needed to go to the ED.And the daughter stated the nurse will talk to the nurse at [facility]. [facility] nurse will see pt tomorrow and will our office if she needs anything. Daughter stated pt denies pain,cramps; he had colonoscopy,polyps removed last month.

## 2020-01-03 NOTE — Telephone Encounter (Signed)
Pt daughter states pt is having dark stool, pls contact [phone]

## 2020-01-03 NOTE — Telephone Encounter (Signed)
Ok, thank you

## 2020-01-04 DIAGNOSIS — S51811A Laceration without foreign body of right forearm, initial encounter: Secondary | ICD-10-CM | POA: Diagnosis not present

## 2020-01-04 DIAGNOSIS — S51802A Unspecified open wound of left forearm, initial encounter: Secondary | ICD-10-CM | POA: Diagnosis not present

## 2020-01-04 DIAGNOSIS — I5032 Chronic diastolic (congestive) heart failure: Secondary | ICD-10-CM | POA: Diagnosis not present

## 2020-01-04 DIAGNOSIS — J449 Chronic obstructive pulmonary disease, unspecified: Secondary | ICD-10-CM | POA: Diagnosis not present

## 2020-01-04 DIAGNOSIS — M6281 Muscle weakness (generalized): Secondary | ICD-10-CM | POA: Diagnosis not present

## 2020-01-04 DIAGNOSIS — R2681 Unsteadiness on feet: Secondary | ICD-10-CM | POA: Diagnosis not present

## 2020-01-04 DIAGNOSIS — Z791 Long term (current) use of non-steroidal anti-inflammatories (NSAID): Secondary | ICD-10-CM | POA: Diagnosis not present

## 2020-01-04 DIAGNOSIS — I69398 Other sequelae of cerebral infarction: Secondary | ICD-10-CM | POA: Diagnosis not present

## 2020-01-04 LAB — PROTIME-INR: INR: 2.8 — AB (ref 0.9–1.1)

## 2020-01-07 DIAGNOSIS — J449 Chronic obstructive pulmonary disease, unspecified: Secondary | ICD-10-CM | POA: Diagnosis not present

## 2020-01-07 DIAGNOSIS — M6281 Muscle weakness (generalized): Secondary | ICD-10-CM | POA: Diagnosis not present

## 2020-01-07 DIAGNOSIS — I69398 Other sequelae of cerebral infarction: Secondary | ICD-10-CM | POA: Diagnosis not present

## 2020-01-07 DIAGNOSIS — S51802A Unspecified open wound of left forearm, initial encounter: Secondary | ICD-10-CM | POA: Diagnosis not present

## 2020-01-07 DIAGNOSIS — S51811A Laceration without foreign body of right forearm, initial encounter: Secondary | ICD-10-CM | POA: Diagnosis not present

## 2020-01-07 DIAGNOSIS — R2681 Unsteadiness on feet: Secondary | ICD-10-CM | POA: Diagnosis not present

## 2020-01-08 DIAGNOSIS — I69398 Other sequelae of cerebral infarction: Secondary | ICD-10-CM | POA: Diagnosis not present

## 2020-01-11 DIAGNOSIS — L84 Corns and callosities: Secondary | ICD-10-CM | POA: Diagnosis not present

## 2020-01-11 DIAGNOSIS — E1051 Type 1 diabetes mellitus with diabetic peripheral angiopathy without gangrene: Secondary | ICD-10-CM | POA: Diagnosis not present

## 2020-01-12 ENCOUNTER — Telehealth: Payer: Self-pay | Admitting: *Deleted

## 2020-01-12 ENCOUNTER — Ambulatory Visit (INDEPENDENT_AMBULATORY_CARE_PROVIDER_SITE_OTHER): Payer: Medicare Other | Admitting: *Deleted

## 2020-01-12 DIAGNOSIS — Z23 Encounter for immunization: Secondary | ICD-10-CM | POA: Diagnosis not present

## 2020-01-12 NOTE — Patient Instructions (Signed)
Continue warfarin 1 tablet daily. Order given to Kathlee Nations RN [facility] INR in 3 weeks.  [facility] has to D/C pt and readmit.  Nanine Means may call to make appt in office for INR check until [facility] comes back out.  Faxed order to [facility] [phone]

## 2020-01-12 NOTE — Telephone Encounter (Signed)
[facility] had to d/c this patient due to a paper work Error- he's going to need to be checked before he can be picked back up by [facility].  Please call [facility] ext [phone]

## 2020-01-14 NOTE — Telephone Encounter (Signed)
Done

## 2020-01-17 ENCOUNTER — Other Ambulatory Visit: Payer: Self-pay

## 2020-01-17 ENCOUNTER — Ambulatory Visit (INDEPENDENT_AMBULATORY_CARE_PROVIDER_SITE_OTHER): Payer: Medicare Other | Admitting: Internal Medicine

## 2020-01-17 DIAGNOSIS — S41101D Unspecified open wound of right upper arm, subsequent encounter: Secondary | ICD-10-CM

## 2020-01-17 DIAGNOSIS — S41102D Unspecified open wound of left upper arm, subsequent encounter: Secondary | ICD-10-CM | POA: Diagnosis not present

## 2020-01-17 NOTE — Progress Notes (Signed)
   Subjective:     Patient ID: Adam Savage, male    DOB: [birth year], 69 y.o.   MRN: [id number]   HPI: The patient is a 69 y.o. male here for follow-up of bilateral upper extremity wounds  This telephone encounter was created for Adam Savage on 01/17/2020 for the following purpose: bilateral upper extremity wound care follow up  Patient states that the wounds have been closed for 10 days.  He was previously placing silver collagen to the right upper extremity wound.  He is currently not using any dressings to either wound sites.  a caregiver is present to help with the encounter.  She confirms that the wounds have healed nicely.  He denies acute pain, drainage or increased warmth or erythema to the wound sites.  He is overall content with the wound healing and would like to follow-up as needed.  The visit was conducted with the patient located at an assisted living facility and provider located at plastic surgery specialist office.  The patient's identity was confirmed using their date of birth and current address.  The patient consented to being evaluated through a phone encounter and understands the associated risks (on examination cannot be done and the patient may need to come in for an appointment) /benefits (allows the patient to remain at home, decreasing exposure to coronavirus).    Review of Systems  All other systems reviewed and are negative.    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  has a past surgical history that  includes Cardiac surgery; Knee surgery (Left); Joint replacement; Colon surgery; CORONARY/GRAFT ANGIOGRAPHY (N/A, 10/31/2017); Cholecystectomy; Esophagogastroduodenoscopy (egd) with propofol (N/A, 01/18/2018); tooth pulled (2021); Colonoscopy with propofol (N/A, 11/25/2019); and polypectomy (11/25/2019).  reports that he has quit smoking. His smoking use included cigarettes. He smoked 1.00 pack per day. He has never used smokeless tobacco. Objective:   Vital Signs There were no vitals taken for this visit. Vital Signs and Nursing Note Reviewed Physical Exam Skin:    Comments: Per patient and caregiver wound sites closed with no signs of infection No swelling, erythema or acute pain upon palpation      Assessment/Plan:     ICD-10-CM   1. Arm wound, left, subsequent encounter  S41.102D   2. Arm wound, right, subsequent encounter  S41.101D    Assessment: Lacerations bilaterally following a fall s/psutures,with open wound to the right forearm  Per patient and caregiver wounds have healed well and have not required a dressing change in the past 10 days.  I recommended using Vaseline as needed for any dried areas.  Patient may follow-up as needed  Plan -Use Vaseline as needed to previous wound sites -Can stop bacitracin  I personally spent 7 minutes on medical discussion  Boyd Kerbs, DO 01/17/2020, 10:22 AM

## 2020-01-21 ENCOUNTER — Telehealth: Payer: Self-pay | Admitting: *Deleted

## 2020-01-21 NOTE — Telephone Encounter (Signed)
Faxed recent office notes on the patient's last office visit to [place].  Per Dr. Heber [place].  Confirmation received.//AB/CMA

## 2020-01-24 ENCOUNTER — Ambulatory Visit (INDEPENDENT_AMBULATORY_CARE_PROVIDER_SITE_OTHER): Payer: Medicare Other | Admitting: *Deleted

## 2020-01-24 DIAGNOSIS — Z952 Presence of prosthetic heart valve: Secondary | ICD-10-CM | POA: Diagnosis not present

## 2020-01-24 DIAGNOSIS — Z7901 Long term (current) use of anticoagulants: Secondary | ICD-10-CM

## 2020-01-24 LAB — POCT INR: INR: 3.9 — AB (ref 2.0–3.0)

## 2020-01-24 NOTE — Patient Instructions (Signed)
Hold warfarin tonight then decrease dose to 1 tablet daily except 1/2 tablet on Wednesdays. Recheck INR in 3 week in office [facility] had to D/C pt and readmit.  Waiting on readmission. Faxed order to [facility] [phone]

## 2020-01-26 ENCOUNTER — Other Ambulatory Visit: Payer: Self-pay

## 2020-01-26 ENCOUNTER — Ambulatory Visit (INDEPENDENT_AMBULATORY_CARE_PROVIDER_SITE_OTHER): Payer: Medicare Other | Admitting: Interventional Cardiology

## 2020-01-26 ENCOUNTER — Encounter: Payer: Self-pay | Admitting: Interventional Cardiology

## 2020-01-26 VITALS — BP 114/60 | HR 60 | Ht 72.0 in | Wt 300.0 lb

## 2020-01-26 DIAGNOSIS — I38 Endocarditis, valve unspecified: Secondary | ICD-10-CM | POA: Diagnosis not present

## 2020-01-26 DIAGNOSIS — Z7901 Long term (current) use of anticoagulants: Secondary | ICD-10-CM

## 2020-01-26 DIAGNOSIS — Z952 Presence of prosthetic heart valve: Secondary | ICD-10-CM

## 2020-01-26 DIAGNOSIS — I252 Old myocardial infarction: Secondary | ICD-10-CM | POA: Diagnosis not present

## 2020-01-26 DIAGNOSIS — G4733 Obstructive sleep apnea (adult) (pediatric): Secondary | ICD-10-CM

## 2020-01-26 DIAGNOSIS — I5030 Unspecified diastolic (congestive) heart failure: Secondary | ICD-10-CM

## 2020-01-26 NOTE — Progress Notes (Signed)
Cardiology Office Note   Date:  01/26/2020   ID:  Add Adam Savage, DOB February 20, 1951, MRN [id number]  PCP:  Adam Anis, MD    No chief complaint on file.  S/p mechanical AVR  Wt Readings from Last 3 Encounters:  01/26/20 300 lb (136.1 kg)  11/30/19 (!) 303 lb (137.4 kg)  11/23/19 (!) 301 lb 9.4 oz (136.8 kg)       History of Present Illness: Adam Savage is a 69 y.o. male  With a h/o mechanical aortic valve replacement. He was hospitalized for NSTEMI in 7/19.   I spoke to the daughter in the hospital and here was the summary of his social situation: "I had a long discussion with the patient's daughter. He has had a difficult time now for several months. The patient's wife had to go stay with her parents to take care of them in their aging years. The husband was left alone. The daughter visited and realized quickly that the patient was unable to care for himself. He has issues with compliance with medications. He has issues with hygiene. He has not been using CPAP. He has irregular sleep patterns per the daughter. She is also concerned about his memory, and thinks the problems may be related to his prior brain surgery. She thinks he show signs of dementia.   The daughter brought her father to live with [place], from [place]. She has had a difficult time, particularly in the last few days. She will certainly need some assistance from case managers."  He ultimately did undergo cardiac catheterization. There was a lesion in the distal LAD which is thought to be perhaps from embolism from his valve. He had a subtherapeutic INR.  Following with neuro due to seizure disorder and dementia.   In 10/19, he had a PEA arest from hemorrhagic shock.  He had some internal bleeding and a Mallory-Weiss tear.  He has been in rehab/[facility].  Ultimately went to [place]. He went home but had to go back to [place]. He still resides there now.  He had a  fall several months ago.  He had some transient blood in stool but this resolved.    He was in the hospital  In 8/21: "Atypical chest pain -Noncardiac in nature.  Echocardiogram showed EF of 65 to 70%.  Seen by cardiology team.  Acute blood loss anemia secondary to GI bleed -Status post 2 units PRBC transfusion, now hemoglobin remained stable.  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  Cleared by GI to resume anticoagulation."  INR has been managed at his facility.    Past Medical History:  Diagnosis Date  . COPD (chronic obstructive pulmonary disease) ([place])   . Dementia (Adam Savage)   . Depression   . Diabetes mellitus without complication ([place])   . GERD (gastroesophageal reflux disease)   . H/O diverticulitis of colon 11/03/2012   History of partial colectomy in 2011.  Marland Kitchen Hepatitis C   . High cholesterol   . Lower extremity edema   . Mechanical heart valve present   . Overdose of muscle relaxant 08/11/2017  . Pre-diabetes   . Retroperitoneal hematoma 01/29/2018  . S/P AVR (aortic valve replacement)   . Seizures (Adam Savage)   . 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 CARDIAC SURGERY    . CHOLECYSTECTOMY    . COLON SURGERY    . COLONOSCOPY WITH PROPOFOL N/A 11/25/2019  Procedure: COLONOSCOPY WITH PROPOFOL;  Surgeon: Adam Houston, MD;  [facility];  Service: Endoscopy;  Laterality: N/A;  . CORONARY/GRAFT ANGIOGRAPHY N/A 10/31/2017   Procedure: CORONARY/GRAFT ANGIOGRAPHY;  Surgeon: Adam Bush, MD;  Location: [facility] LAB;  Service: Cardiovascular;  Laterality: N/A;  . ESOPHAGOGASTRODUODENOSCOPY (EGD) WITH PROPOFOL N/A 01/18/2018   Procedure: ESOPHAGOGASTRODUODENOSCOPY (EGD) WITH PROPOFOL;  Surgeon: Adam Pole, MD;  Location: [facility];  Service: Endoscopy;  Laterality: N/A;  . JOINT REPLACEMENT    . KNEE SURGERY Left   . POLYPECTOMY  11/25/2019   Procedure:  POLYPECTOMY;  Surgeon: Adam Houston, MD;  [facility];  Service: Endoscopy;;  . tooth pulled  2021     Current Outpatient Medications  Medication Sig Dispense Refill  . albuterol (VENTOLIN HFA) 108 (90 Base) MCG/ACT inhaler Inhale 2 puffs into the lungs every 4 (four) hours as needed for wheezing or shortness of breath. 18 g 2  . atorvastatin (LIPITOR) 10 MG tablet Take 1 tablet (10 mg total) by mouth daily. 90 tablet 3  . bacitracin ointment Apply 1 application topically daily. 120 g 0  . carvedilol (COREG) 6.25 MG tablet Take 1 tablet (6.25 mg total) by mouth 2 (two) times daily with a meal. 180 tablet 1  . chlorhexidine (PERIDEX) 0.12 % solution Use as directed 15 mLs in the mouth or throat 2 (two) times daily.    Marland Kitchen escitalopram (LEXAPRO) 10 MG tablet Take 1 tablet (10 mg total) by mouth at bedtime. 90 tablet 3  . ferrous sulfate 325 (65 FE) MG EC tablet Take 1 tablet (325 mg total) by mouth 2 (two) times daily. 60 tablet 3  . furosemide (LASIX) 80 MG tablet Take 80 mg by mouth 2 (two) times daily.    Marland Kitchen glucose blood test strip Test blood sugar 3 times daily before meals 100 each 12  . HYDROcodone-acetaminophen (NORCO/VICODIN) 5-325 MG tablet Take 1 tablet by mouth every 8 (eight) hours as needed for moderate pain. No more than 2 tablets per day. 60 tablet 0  . insulin aspart (NOVOLOG FLEXPEN) 100 UNIT/ML FlexPen INJECT 10 UNITS INTO THE SKIN 3 (THREE) TIMES DAILY WITH MEALS. ADJUST DOSE AS NEEDED 45 mL 1  . insulin detemir (LEVEMIR) 100 UNIT/ML injection Inject 0.28 mLs (28 Units total) into the skin at bedtime. Increase Levemir by 2 units when the week average is >200. 10 mL 3  . Insulin Pen Needle (ADVOCATE INSULIN PEN NEEDLES) 31G X 5 MM MISC 1 Package by Does not apply route 4 (four) times daily. 100 each 2  . lactulose (CHRONULAC) 10 GM/15ML solution Take 15 mLs (10 g total) by mouth 2 (two) times daily. 473 mL 3  . Lancets MISC 1 Stick by Does not apply route 3 (three)  times daily with meals. 200 each 3  . levETIRAcetam (KEPPRA) 1000 MG tablet Take 1 tablet (1,000 mg total) by mouth 2 (two) times daily. 180 tablet 3  . memantine (NAMENDA) 5 MG tablet Take 5 mg by mouth 2 (two) times daily.    . pantoprazole (PROTONIX) 40 MG tablet Take 1 tablet (40 mg total) by mouth daily. 90 tablet 3  . QUEtiapine (SEROQUEL) 100 MG tablet TAKE 1 TABLETS IN THE EVENING 135 tablet 3  . QUEtiapine (SEROQUEL) 25 MG tablet Take 1 tablet in AM 90 tablet 3  . spironolactone (ALDACTONE) 50 MG tablet Take 50 mg by mouth daily.    Marland Kitchen warfarin (COUMADIN) 4 MG tablet Take  1 tablet (4 mg total) by mouth daily. 90 tablet 3   No current facility-administered medications for this visit.    Allergies:   Oxycodone, Ativan [lorazepam], and Morphine and related    Social History:  The patient  reports that he has quit smoking. His smoking use included cigarettes. He smoked 1.00 pack per day. He has never used smokeless tobacco. He reports previous alcohol use. He reports that he does not use drugs.   Family History:  The patient's family history includes Hypertension in his father.    ROS:  Please see the history of present illness.   Otherwise, review of systems are positive for back pain.   All other systems are reviewed and negative.    PHYSICAL EXAM: VS:  BP 114/60   Pulse 60   Ht 6' (1.829 m)   Wt 300 lb (136.1 kg)   SpO2 91%   BMI 40.69 kg/m  , BMI Body mass index is 40.69 kg/m. GEN: Well nourished, well developed, in no acute distress  HEENT: normal  Neck: no JVD, carotid bruits, or masses Cardiac: RRR with premature beats; crisp S2; no murmurs, rubs, or gallops,no edema  Respiratory:  clear to auscultation bilaterally, normal work of breathing GI: soft, nontender, nondistended, + BS MS: no deformity or atrophy  Skin: warm and dry, no rash Neuro:  Strength and sensation are intact Psych: euthymic mood, full affect   EKG:   The ekg ordered 11/30/19 demonstrates NSR,  PACs, nonspecific ST changes   Recent Labs: 09/28/2019: TSH 1.590 11/22/2019: ALT 19; B Natriuretic Peptide 118.0 11/30/2019: BUN 20; Creatinine, Ser 1.51; Hemoglobin 10.0; Platelets 104; Potassium 4.5; Sodium 135   Lipid Panel    Component Value Date/Time   CHOL 118 11/23/2019 0527   TRIG 55 11/23/2019 0527   HDL 44 11/23/2019 0527   CHOLHDL 2.7 11/23/2019 0527   VLDL 11 11/23/2019 0527   LDLCALC 63 11/23/2019 0527     Other studies Reviewed: Additional studies/ records that were reviewed today with results demonstrating: Hospital records reviewed   ASSESSMENT AND PLAN:  1. S/p mechanical AVR: Needs SBE prophylaxis.  Crisp S2 click.  No CHF sx. 2. OSA: followed with Dr. Radford Pax.  Did not tolerate CPAP.  3. Anticoagulated: Has had some GI bleeding treated by GI/clipping.  OK to resume coumadin.   INR target 2-3. 4. 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  5. Obesity:  Difficult to lose weight as activity is limited.    Current medicines are reviewed at length with the patient today.  The patient concerns regarding his medicines were addressed.  The following changes have been made:  No change  Labs/ tests ordered today include:  No orders of the defined types were placed in this encounter.   Recommend 150 minutes/week of aerobic exercise Low fat, low carb, high fiber diet recommended  Disposition:   FU in 9 months   Signed, Larae Grooms, MD  01/26/2020 5:03 PM    [facility] [address], [place], [place]  [address] Phone: [phone]; Fax: [phone]

## 2020-01-26 NOTE — Patient Instructions (Signed)
Medication Instructions:  Your physician recommends that you continue on your current medications as directed. Please refer to the Current Medication list given to you today.  *If you need a refill on your cardiac medications before your next appointment, please call your pharmacy*   Lab Work: None  If you have labs (blood work) drawn today and your tests are completely normal, you will receive your results only by: Marland Kitchen MyChart Message (if you have MyChart) OR . A paper copy in the mail If you have any lab test that is abnormal or we need to change your treatment, we will call you to review the results.   Testing/Procedures: None   Follow-Up: At [facility], you and your health needs are our priority.  As part of our continuing mission to provide you with exceptional heart care, we have created designated Provider Care Teams.  These Care Teams include your primary Cardiologist (physician) and Advanced Practice Providers (APPs -  Physician Assistants and Nurse Practitioners) who all work together to provide you with the care you need, when you need it.  We recommend signing up for the patient portal called "MyChart".  Sign up information is provided on this After Visit Summary.  MyChart is used to connect with patients for Virtual Visits (Telemedicine).  Patients are able to view lab/test results, encounter notes, upcoming appointments, etc.  Non-urgent messages can be sent to your provider as well.   To learn more about what you can do with MyChart, go to [email or web address].    Your next appointment:   8 month(s)  The format for your next appointment:   In Person  Provider:   You may see Larae Grooms, MD or one of the following Advanced Practice Providers on your designated Care Team:    Melina Copa, PA-C  Ermalinda Barrios, PA-C    Other Instructions None

## 2020-01-27 ENCOUNTER — Ambulatory Visit (INDEPENDENT_AMBULATORY_CARE_PROVIDER_SITE_OTHER): Payer: Medicare Other | Admitting: Internal Medicine

## 2020-01-27 ENCOUNTER — Encounter: Payer: Self-pay | Admitting: Internal Medicine

## 2020-01-27 VITALS — BP 116/72 | HR 53 | Temp 97.8°F | Ht 72.0 in | Wt 301.0 lb

## 2020-01-27 DIAGNOSIS — B182 Chronic viral hepatitis C: Secondary | ICD-10-CM | POA: Diagnosis not present

## 2020-01-27 DIAGNOSIS — K7460 Unspecified cirrhosis of liver: Secondary | ICD-10-CM

## 2020-01-27 NOTE — Assessment & Plan Note (Signed)
Will do his SVR24 today and if negative, he is considered cured.

## 2020-01-27 NOTE — Assessment & Plan Note (Signed)
Will continue with HCC screening 

## 2020-01-27 NOTE — Progress Notes (Signed)
   Subjective:    Patient ID: Adam Savage, male    DOB: 03/09/1951, 69 y.o.   MRN: [id number]  HPI Here for follow up of chronic hepatitis C. He was treated for genotype 1 hepatitis C with cirrhosis for 24 weeks and completed treatment over 6 months ago.  He had his [place] screening ultrasound in August with no new issues.  He recently broke two vertebrae and was hospitalized with chest pain.    Review of Systems  Constitutional: Negative for fatigue.  Gastrointestinal: Negative for diarrhea and nausea.  Skin: Negative for rash.       Objective:   Physical Exam Constitutional:      Appearance: Normal appearance.  Eyes:     General: No scleral icterus. Pulmonary:     Effort: Pulmonary effort is normal.  Neurological:     Mental Status: He is alert.  Psychiatric:        Mood and Affect: Mood normal.   SH: no alcohol        Assessment & Plan:

## 2020-01-30 LAB — COMPLETE METABOLIC PANEL WITH GFR
AG Ratio: 1 (calc) (ref 1.0–2.5)
ALT: 20 U/L (ref 9–46)
AST: 35 U/L (ref 10–35)
Albumin: 3.4 g/dL — ABNORMAL LOW (ref 3.6–5.1)
Alkaline phosphatase (APISO): 211 U/L — ABNORMAL HIGH (ref 35–144)
BUN/Creatinine Ratio: 10 (calc) (ref 6–22)
BUN: 15 mg/dL (ref 7–25)
CO2: 30 mmol/L (ref 20–32)
Calcium: 8.8 mg/dL (ref 8.6–10.3)
Chloride: 97 mmol/L — ABNORMAL LOW (ref 98–110)
Creat: 1.45 mg/dL — ABNORMAL HIGH (ref 0.70–1.25)
GFR, Est African American: 57 mL/min/{1.73_m2} — ABNORMAL LOW (ref >=60)
GFR, Est Non African American: 49 mL/min/{1.73_m2} — ABNORMAL LOW (ref >=60)
Globulin: 3.4 g/dL (calc) (ref 1.9–3.7)
Glucose, Bld: 212 mg/dL — ABNORMAL HIGH (ref 65–99)
Potassium: 3.4 mmol/L — ABNORMAL LOW (ref 3.5–5.3)
Sodium: 136 mmol/L (ref 135–146)
Total Bilirubin: 0.9 mg/dL (ref 0.2–1.2)
Total Protein: 6.8 g/dL (ref 6.1–8.1)

## 2020-01-30 LAB — HCV RNA,QN PCR RFLX GENO, LIPABAD
HCV RNA, PCR, QN (Log): <1.18 log IU/mL
HCV RNA, PCR, QN: <15 IU/mL

## 2020-02-08 ENCOUNTER — Other Ambulatory Visit: Payer: Self-pay | Admitting: *Deleted

## 2020-02-08 DIAGNOSIS — S32010S Wedge compression fracture of first lumbar vertebra, sequela: Secondary | ICD-10-CM

## 2020-02-08 MED ORDER — HYDROCODONE-ACETAMINOPHEN 5-325 MG PO TABS: 1.0000 | ORAL_TABLET | Freq: Three times a day (TID) | ORAL | 0 refills | Status: DC | PRN

## 2020-02-08 NOTE — Telephone Encounter (Signed)
Last rx written 12/02/19. Last OV 12/02/19 with Dr Charleen Kirks. UDS 06/23/18.

## 2020-02-14 ENCOUNTER — Ambulatory Visit (INDEPENDENT_AMBULATORY_CARE_PROVIDER_SITE_OTHER): Payer: Medicare Other | Admitting: *Deleted

## 2020-02-14 DIAGNOSIS — Z952 Presence of prosthetic heart valve: Secondary | ICD-10-CM

## 2020-02-14 DIAGNOSIS — Z5181 Encounter for therapeutic drug level monitoring: Secondary | ICD-10-CM

## 2020-02-14 LAB — POCT INR: INR: 2.9 (ref 2.0–3.0)

## 2020-02-14 NOTE — Patient Instructions (Signed)
Continue warfarin 1 tablet daily except 1/2 tablet on Wednesdays. Recheck INR in 4 week in office

## 2020-02-15 ENCOUNTER — Ambulatory Visit: Payer: Medicare Other | Admitting: Gastroenterology

## 2020-02-18 DIAGNOSIS — Z23 Encounter for immunization: Secondary | ICD-10-CM | POA: Diagnosis not present

## 2020-03-13 ENCOUNTER — Ambulatory Visit (INDEPENDENT_AMBULATORY_CARE_PROVIDER_SITE_OTHER): Payer: Medicare Other | Admitting: *Deleted

## 2020-03-13 ENCOUNTER — Other Ambulatory Visit: Payer: Self-pay

## 2020-03-13 DIAGNOSIS — Z952 Presence of prosthetic heart valve: Secondary | ICD-10-CM | POA: Diagnosis not present

## 2020-03-13 DIAGNOSIS — Z5181 Encounter for therapeutic drug level monitoring: Secondary | ICD-10-CM | POA: Diagnosis not present

## 2020-03-13 LAB — POCT INR: INR: 3.2 — AB (ref 2.0–3.0)

## 2020-03-13 NOTE — Patient Instructions (Signed)
Decrease warfarin to 1 tablet daily except 1/2 tablet on Mondays and Thursdays. Recheck INR in 4 week in office

## 2020-03-28 DIAGNOSIS — E1051 Type 1 diabetes mellitus with diabetic peripheral angiopathy without gangrene: Secondary | ICD-10-CM | POA: Diagnosis not present

## 2020-03-28 DIAGNOSIS — L84 Corns and callosities: Secondary | ICD-10-CM | POA: Diagnosis not present

## 2020-04-10 ENCOUNTER — Other Ambulatory Visit: Payer: Self-pay

## 2020-04-10 ENCOUNTER — Ambulatory Visit (INDEPENDENT_AMBULATORY_CARE_PROVIDER_SITE_OTHER): Payer: Medicare Other | Admitting: *Deleted

## 2020-04-10 DIAGNOSIS — Z952 Presence of prosthetic heart valve: Secondary | ICD-10-CM

## 2020-04-10 DIAGNOSIS — Z5181 Encounter for therapeutic drug level monitoring: Secondary | ICD-10-CM | POA: Diagnosis not present

## 2020-04-10 LAB — POCT INR: INR: 3 (ref 2.0–3.0)

## 2020-04-10 NOTE — Patient Instructions (Signed)
Continue warfarin 1 tablet daily except 1/2 tablet on Mondays and Thursdays. Recheck INR in 4 week in office

## 2020-04-24 ENCOUNTER — Other Ambulatory Visit: Payer: Self-pay

## 2020-04-24 DIAGNOSIS — S32010S Wedge compression fracture of first lumbar vertebra, sequela: Secondary | ICD-10-CM

## 2020-04-24 DIAGNOSIS — F1190 Opioid use, unspecified, uncomplicated: Secondary | ICD-10-CM

## 2020-04-25 MED ORDER — HYDROCODONE-ACETAMINOPHEN 5-325 MG PO TABS: 1.0000 | ORAL_TABLET | Freq: Three times a day (TID) | ORAL | 0 refills | Status: DC | PRN

## 2020-04-25 NOTE — Assessment & Plan Note (Signed)
Request received for refill of norco 5-325 mg bid prn. He is on chronic pain medication for compression fracture and lower back pain. Strangely PDMP does not show refill since 2020 although there is chart documentation and refills within Epic that show he has been receiving his prescribed medication. This may be because patient lives at [facility] and is refilled through their pharmacy.    - prescription refilled

## 2020-05-08 ENCOUNTER — Other Ambulatory Visit: Payer: Self-pay

## 2020-05-08 ENCOUNTER — Ambulatory Visit (INDEPENDENT_AMBULATORY_CARE_PROVIDER_SITE_OTHER): Payer: Medicare Other | Admitting: *Deleted

## 2020-05-08 DIAGNOSIS — Z5181 Encounter for therapeutic drug level monitoring: Secondary | ICD-10-CM

## 2020-05-08 DIAGNOSIS — Z952 Presence of prosthetic heart valve: Secondary | ICD-10-CM | POA: Diagnosis not present

## 2020-05-08 LAB — POCT INR: INR: 2.1 (ref 2.0–3.0)

## 2020-05-08 NOTE — Patient Instructions (Signed)
Continue warfarin 1 tablet daily except 1/2 tablet on Mondays and Thursdays. Recheck INR in 5 weeks in office

## 2020-05-10 DIAGNOSIS — Z03818 Encounter for observation for suspected exposure to other biological agents ruled out: Secondary | ICD-10-CM | POA: Diagnosis not present

## 2020-05-10 DIAGNOSIS — Z20822 Contact with and (suspected) exposure to covid-19: Secondary | ICD-10-CM | POA: Diagnosis not present

## 2020-05-11 ENCOUNTER — Telehealth: Payer: Self-pay | Admitting: *Deleted

## 2020-05-11 NOTE — Telephone Encounter (Addendum)
Patient's daughter called to report her whole family tested positive for Covid yesterday. As patient spends every weekend with them, she asked the ALF to test patient. He also tested positive. Per ALF request, patient is quarantining with his family to limit exposure to other residents. He did receive his booster vaccine several months ago. He began having nasal congestion and body aches today but seems to be doing well. Denies fever or SHOB. She is advised to ensure he drinks plenty of fluids, rest, take whatever he usually takes for pain. If develops fever or SHOB to head directly to ED. She is in agreement. Hubbard Hartshorn, BSN, RN-BC

## 2020-05-22 ENCOUNTER — Other Ambulatory Visit: Payer: Medicare Other

## 2020-05-24 ENCOUNTER — Other Ambulatory Visit: Payer: Self-pay | Admitting: Internal Medicine

## 2020-05-24 DIAGNOSIS — Z794 Long term (current) use of insulin: Secondary | ICD-10-CM

## 2020-05-24 DIAGNOSIS — E119 Type 2 diabetes mellitus without complications: Secondary | ICD-10-CM

## 2020-05-24 MED ORDER — GLUCOSE BLOOD VI STRP
ORAL_STRIP | 12 refills | Status: DC
Start: 2020-05-24 — End: 2020-07-10

## 2020-05-24 NOTE — Telephone Encounter (Signed)
RTC to [place] at [facility]. She states the facility picked up the last RX for his test strips.  She is asking if MD would send in new RX w/ refills for the test strips (One touch Ultra Blue) to [facility] in [place] to have on hold for March  Thanks, [place], RN,BSN

## 2020-05-24 NOTE — Telephone Encounter (Signed)
Please call Sharyn Lull back at [facility] calling to have a prescriptionsent to his pharmacy for the following:  glucose blood test strip -One Touch Ultra Blue to the pharmacy listed below as it is close to the Facility he is at  [facility]  Address: [address] [place], [place] [address] Phone: [phone]

## 2020-06-01 ENCOUNTER — Ambulatory Visit
Admission: RE | Admit: 2020-06-01 | Discharge: 2020-06-01 | Disposition: A | Discharge status: Home / Self Care | Payer: Medicare Other | Source: Ambulatory Visit | Attending: Internal Medicine | Admitting: Internal Medicine

## 2020-06-01 DIAGNOSIS — K7460 Unspecified cirrhosis of liver: Secondary | ICD-10-CM

## 2020-06-01 DIAGNOSIS — B182 Chronic viral hepatitis C: Secondary | ICD-10-CM

## 2020-06-01 DIAGNOSIS — B1920 Unspecified viral hepatitis C without hepatic coma: Secondary | ICD-10-CM | POA: Diagnosis not present

## 2020-06-12 ENCOUNTER — Ambulatory Visit (INDEPENDENT_AMBULATORY_CARE_PROVIDER_SITE_OTHER): Payer: Medicare Other | Admitting: *Deleted

## 2020-06-12 DIAGNOSIS — Z952 Presence of prosthetic heart valve: Secondary | ICD-10-CM | POA: Diagnosis not present

## 2020-06-12 DIAGNOSIS — Z5181 Encounter for therapeutic drug level monitoring: Secondary | ICD-10-CM | POA: Diagnosis not present

## 2020-06-12 LAB — POCT INR: INR: 2.1 (ref 2.0–3.0)

## 2020-06-12 NOTE — Patient Instructions (Signed)
Continue warfarin 1 tablet daily except 1/2 tablet on Mondays and Thursdays. Recheck INR in 6 weeks in office

## 2020-06-13 DIAGNOSIS — L84 Corns and callosities: Secondary | ICD-10-CM | POA: Diagnosis not present

## 2020-06-13 DIAGNOSIS — E1051 Type 1 diabetes mellitus with diabetic peripheral angiopathy without gangrene: Secondary | ICD-10-CM | POA: Diagnosis not present

## 2020-06-20 ENCOUNTER — Other Ambulatory Visit: Payer: Self-pay | Admitting: Internal Medicine

## 2020-06-20 DIAGNOSIS — Z794 Long term (current) use of insulin: Secondary | ICD-10-CM

## 2020-06-20 DIAGNOSIS — E119 Type 2 diabetes mellitus without complications: Secondary | ICD-10-CM

## 2020-06-22 ENCOUNTER — Telehealth: Payer: Self-pay | Admitting: Internal Medicine

## 2020-06-22 NOTE — Telephone Encounter (Signed)
Hey everyone, Can we schedule an appointment with Mr. Balistreri sometime next week, preferably towards the beginning of the week? If we can fit him in tomorrow that would be even better. Would like for him to be evaluated in case he has a heart failure exacerbation.   Thanks! Doren Custard

## 2020-06-22 NOTE — Telephone Encounter (Signed)
Return call to [facility] - states pt has gain 3 lbs within the last 2 days; states pt denies sob or any other symptoms.

## 2020-06-22 NOTE — Telephone Encounter (Signed)
Rec'd a call from the Med Tech at [facility] in [place] requesting a call back about this patient.  Per Med tech Larrie Kass this patient has gained 3 pounds in two days.

## 2020-06-23 NOTE — Telephone Encounter (Signed)
[facility] office - please schedule pt an appt  Per Dr Collene Gobble.

## 2020-06-26 NOTE — Telephone Encounter (Signed)
Spoke with the Facility and he is sch for 06/27/2020 @ 10:45 am to f/u.

## 2020-06-27 ENCOUNTER — Other Ambulatory Visit: Payer: Self-pay

## 2020-06-27 ENCOUNTER — Encounter: Payer: Self-pay | Admitting: Internal Medicine

## 2020-06-27 ENCOUNTER — Ambulatory Visit (INDEPENDENT_AMBULATORY_CARE_PROVIDER_SITE_OTHER): Payer: Medicare Other | Admitting: Internal Medicine

## 2020-06-27 VITALS — BP 127/76 | HR 58 | Temp 98.0°F | Ht 72.0 in | Wt 306.0 lb

## 2020-06-27 DIAGNOSIS — I5030 Unspecified diastolic (congestive) heart failure: Secondary | ICD-10-CM

## 2020-06-27 DIAGNOSIS — I38 Endocarditis, valve unspecified: Secondary | ICD-10-CM

## 2020-06-27 NOTE — Patient Instructions (Signed)
To Mr. Laverdiere,  It was a pleasure meeting you today. Today we discussed your recent weight gain. It is likely due to possible increase in your salt intake. Based on your physical exam you are not experiencing a heart failure exacerbation. We will not increase your Lasix at this time, but we will continue watching your weight throughout the week. Please call the clinic if you start having shortness of breath or signs of volume overload. Otherwise, I will have you follow up with Dr. Truman Hayward or someone from his team in one month's time. We will additionally check your kidney function today with some blood work.  Have a good day,  Maudie Mercury, MD

## 2020-06-28 ENCOUNTER — Encounter: Payer: Self-pay | Admitting: Internal Medicine

## 2020-06-28 LAB — BMP8+ANION GAP
Anion Gap: 18 mmol/L (ref 10.0–18.0)
BUN/Creatinine Ratio: 13 (ref 10–24)
BUN: 19 mg/dL (ref 8–27)
CO2: 21 mmol/L (ref 20–29)
Calcium: 8.7 mg/dL (ref 8.6–10.2)
Chloride: 97 mmol/L (ref 96–106)
Creatinine, Ser: 1.49 mg/dL — ABNORMAL HIGH (ref 0.76–1.27)
Glucose: 260 mg/dL — ABNORMAL HIGH (ref 65–99)
Potassium: 4 mmol/L (ref 3.5–5.2)
Sodium: 136 mmol/L (ref 134–144)
eGFR: 50 mL/min/{1.73_m2} — ABNORMAL LOW (ref >59)

## 2020-06-28 NOTE — Assessment & Plan Note (Signed)
Patient presents to the clinic with concerns from his facility of possible congestive heart failure after gaining 3 pounds in two days. Patient does not endorse orthopnea, SHOB, having clothes not fit correctly, or any overt swelling.   As far as diet is concerned states that he does eat the food from his facility, but his manager does inform me that he does have his own snacks and drinks. He is not on fluid restriction. He states that he does go home to his family on the weekends and this past weekend he had a very well seasoned steak dinner.   Physical examination has no signs of possible heart failure exacerbation. Likely his weight gain is due to increased salt intake 2/2 to diet.   Plan:  - Will let patient naturally diurese, no changes in current 80 mg lasix BID, spironolactone 50 mg daily.  - Continue to monitor for weight fluctuation, if patient develops signs and symptoms (Orthopnea, SHOB, overt swelling) facility instructed to call clinic.  - FU in 4 weeks

## 2020-06-28 NOTE — Progress Notes (Signed)
Internal Medicine Clinic Attending ? ?Case discussed with Dr. Winters  At the time of the visit.  We reviewed the resident?s history and exam and pertinent patient test results.  I agree with the assessment, diagnosis, and plan of care documented in the resident?s note.  ?

## 2020-06-28 NOTE — Progress Notes (Signed)
   CC: Rule out Congestive HF  HPI:  [name] is a 70 y.o. M/F, with a PMH noted below, who presents to the clinic to gaining 3 pounds in 2 days. To see the management of their acute and chronic conditions, please see the A&P note under the Encounters tab.   Past Medical History:  Diagnosis Date  . COPD (chronic obstructive pulmonary disease) ([place])   . Dementia ([place])   . Depression   . Diabetes mellitus without complication ([place])   . GERD (gastroesophageal reflux disease)   . H/O diverticulitis of colon 11/03/2012   History of partial colectomy in 2011.  Marland Kitchen Hepatitis C   . High cholesterol   . Lower extremity edema   . Mechanical heart valve present   . Overdose of muscle relaxant 08/11/2017  . Pre-diabetes   . Retroperitoneal hematoma 01/29/2018  . S/P AVR (aortic valve replacement)   . Seizures ([place])   . 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  Respiratory: Negative for cough.   Cardiovascular: Negative for chest pain, palpitations, orthopnea, claudication and leg swelling.  Gastrointestinal: Negative for abdominal pain, constipation, diarrhea, nausea and vomiting.  Genitourinary: Negative for dysuria, frequency and urgency.     Physical Exam:  Vitals:   06/27/20 1103  BP: 127/76  Pulse: (!) 58  Temp: 98 F (36.7 C)  TempSrc: Oral  SpO2: 99%  Weight: (!) 306 lb (138.8 kg)  Height: 6' (1.829 m)   Physical Exam Constitutional:      General: He is not in acute distress.    Appearance: He is obese. He is not ill-appearing, toxic-appearing or diaphoretic.  Cardiovascular:     Rate and Rhythm: Normal rate and regular rhythm.     Pulses: Normal pulses.     Heart sounds: No murmur heard. No friction rub. No gallop.   Pulmonary:     Effort: Pulmonary effort is normal. No respiratory distress.     Breath sounds: Normal breath sounds. No  stridor. No wheezing, rhonchi or rales.  Musculoskeletal:        General: No swelling or tenderness.     Right lower leg: No edema.     Left lower leg: No edema.  Neurological:     Mental Status: He is alert and oriented to person, place, and time.      Assessment & Plan:   See Encounters Tab for problem based charting.  Patient discussed with Dr. Jimmye Norman

## 2020-07-08 ENCOUNTER — Other Ambulatory Visit: Payer: Self-pay | Admitting: Internal Medicine

## 2020-07-08 DIAGNOSIS — Z794 Long term (current) use of insulin: Secondary | ICD-10-CM

## 2020-07-08 DIAGNOSIS — E119 Type 2 diabetes mellitus without complications: Secondary | ICD-10-CM

## 2020-07-10 ENCOUNTER — Other Ambulatory Visit: Payer: Self-pay | Admitting: Student

## 2020-07-10 DIAGNOSIS — E119 Type 2 diabetes mellitus without complications: Secondary | ICD-10-CM

## 2020-07-10 DIAGNOSIS — Z794 Long term (current) use of insulin: Secondary | ICD-10-CM

## 2020-07-12 NOTE — Telephone Encounter (Signed)
rx recently sent in by yellow [facility], but pharmacy was unable to bill insurance for this physician-will send refill request to attending

## 2020-07-24 ENCOUNTER — Telehealth: Payer: Self-pay | Admitting: *Deleted

## 2020-07-24 NOTE — Telephone Encounter (Signed)
Call from [place] with [facility] stating pt's daughter wants to cancel tomorrow's appt. States pt is back to his normal weight -303.2 lbs as of 07/21/20

## 2020-07-24 NOTE — Telephone Encounter (Signed)
Okay thank you for the update

## 2020-07-25 ENCOUNTER — Telehealth: Payer: Self-pay

## 2020-07-25 ENCOUNTER — Encounter: Payer: Medicare Other | Admitting: Internal Medicine

## 2020-07-25 ENCOUNTER — Telehealth: Payer: Self-pay | Admitting: *Deleted

## 2020-07-25 NOTE — Telephone Encounter (Signed)
I think he needs an in-person visit in order to qualify for home health by medicare right?

## 2020-07-25 NOTE — Telephone Encounter (Signed)
CALLED PATIENT LEFT VOICE MESSAGE FOR PATIENT TO RETURN CALL TO THE CLINIC @ [phone] TO RESCHEDULE THE APPOINTMENT.

## 2020-07-25 NOTE — Telephone Encounter (Signed)
It can be done by virtual appt as long as > 50% of time is face to face.

## 2020-07-25 NOTE — Telephone Encounter (Signed)
If front desk can help schedule appointment to discuss home health needs, that would be great. I would prefer in-person but if patient prefers virtual, that's fine.

## 2020-07-25 NOTE — Telephone Encounter (Signed)
I called pt's daughter,Ashley, to find out when pt will be leaving rehab; she stated end of April.

## 2020-07-25 NOTE — Telephone Encounter (Signed)
Pt's daughter requesting to speak with a nurse about getting home health nurse to come to the house once pt is discharge from rehab. Also requesting all meds to be transferred to  [id number] - [place], [place] Phone:  [phone]  Fax:  [phone]     Please call back.

## 2020-08-01 ENCOUNTER — Ambulatory Visit (INDEPENDENT_AMBULATORY_CARE_PROVIDER_SITE_OTHER): Payer: Medicare Other | Admitting: *Deleted

## 2020-08-01 DIAGNOSIS — Z5181 Encounter for therapeutic drug level monitoring: Secondary | ICD-10-CM | POA: Diagnosis not present

## 2020-08-01 DIAGNOSIS — Z952 Presence of prosthetic heart valve: Secondary | ICD-10-CM | POA: Diagnosis not present

## 2020-08-01 LAB — POCT INR: INR: 1.8 — AB (ref 2.0–3.0)

## 2020-08-01 NOTE — Patient Instructions (Signed)
Increase dose to 1 tablet daily except 1/2 tablet on Mondays. Recheck INR in 4 weeks in office

## 2020-08-02 ENCOUNTER — Other Ambulatory Visit: Payer: Self-pay

## 2020-08-02 DIAGNOSIS — S32010S Wedge compression fracture of first lumbar vertebra, sequela: Secondary | ICD-10-CM

## 2020-08-02 MED ORDER — ATORVASTATIN CALCIUM 10 MG PO TABS: 10.0000 mg | ORAL_TABLET | Freq: Every day | ORAL | 3 refills | Status: DC

## 2020-08-02 MED ORDER — HYDROCODONE-ACETAMINOPHEN 5-325 MG PO TABS: 1.0000 | ORAL_TABLET | Freq: Three times a day (TID) | ORAL | 0 refills | Status: DC | PRN

## 2020-08-02 NOTE — Telephone Encounter (Signed)
Great. Thank you.

## 2020-08-03 ENCOUNTER — Encounter: Payer: Self-pay | Admitting: Internal Medicine

## 2020-08-03 ENCOUNTER — Telehealth (INDEPENDENT_AMBULATORY_CARE_PROVIDER_SITE_OTHER): Payer: Medicare Other | Admitting: Internal Medicine

## 2020-08-03 ENCOUNTER — Other Ambulatory Visit: Payer: Self-pay

## 2020-08-03 DIAGNOSIS — I38 Endocarditis, valve unspecified: Secondary | ICD-10-CM | POA: Diagnosis not present

## 2020-08-03 DIAGNOSIS — F0281 Dementia in other diseases classified elsewhere with behavioral disturbance: Secondary | ICD-10-CM

## 2020-08-03 DIAGNOSIS — F02818 Dementia in other diseases classified elsewhere, unspecified severity, with other behavioral disturbance: Secondary | ICD-10-CM

## 2020-08-03 DIAGNOSIS — Z7901 Long term (current) use of anticoagulants: Secondary | ICD-10-CM

## 2020-08-03 DIAGNOSIS — I5030 Unspecified diastolic (congestive) heart failure: Secondary | ICD-10-CM | POA: Diagnosis not present

## 2020-08-03 DIAGNOSIS — S32010S Wedge compression fracture of first lumbar vertebra, sequela: Secondary | ICD-10-CM | POA: Diagnosis not present

## 2020-08-03 NOTE — Assessment & Plan Note (Signed)
Managed by cardiology. Currently on anti-coagulation for hx of mechanical aortic valve. Last INR 1.8. subtherapeutic. Denies any current bleed. Family requesting home health nursing for INR checks as patient moving out of ALF soon.  - Home health nursing ordered

## 2020-08-03 NOTE — Assessment & Plan Note (Addendum)
Currently on norco 5-325mg  BID PRN for chronic back pain. Prior imaging showing L1 compression fracture. Mentions continuing to endorse pain but is tolerable. Getting exercise intermittently at current facility. Able to ambulate with walker.  A/P Low back pain, stable, chronic. PDMP inaccurate due to system error although prior conversation with pharmacy reveal regular dispense. - C/w norco - Need PT for improving functioning but moving out of ALF soon, home PT ordered

## 2020-08-03 NOTE — Assessment & Plan Note (Addendum)
Wt Readings from Last 3 Encounters:  06/27/20 (!) 306 lb (138.8 kg)  01/27/20 (!) 301 lb (136.5 kg)  01/26/20 300 lb (136.1 kg)   Seen in clinic last month for possible exacerbation with worsening weight gain. He was found to have 5 lb weight gain from dry weight but without clinical evidence of acute heart failure exacerbation. Found to have had recent visit to family where he participated in high sodium intake. Denies any current dyspnea, lower extremity swelling. Sharyn Lull, Midwife at [employer], mentions weight returning to dry weight after being more adherent to low salt diet  A/P Stable chronic diastolic heart failure - C/w furosemide 80mg  BID, spironolactone 50mg  daily - Home health nursing for medication management as moving out of ALF soon

## 2020-08-03 NOTE — Assessment & Plan Note (Signed)
Continuing to endorse waxing and waning mentation. Mini-Cog performed in 2021 w/ score of 3. TSH wnl on prior check. Previously on memantine but d/ced last year due to medication -induced confusion.  Currently on seroquel, escitalopram  - C/w seroquel, escitalopram

## 2020-08-03 NOTE — Progress Notes (Addendum)
[facility] Internal Medicine Residency Telemedicine Encounter Continuity Care Appointment  HPI:  This video encounter was created for Mr. Juan Kissoon on 08/03/2020 for the following purpose/cc back pain:   Mr.Lindo is a 70 yo M w/ PMH of diastolic heart failure, OSA, COPD, hepatic cirrhosis, AS s/p mechanical valve replacement on chronic anti-coagulation presenting via video visit for management of his chronic conditions. He was last seen in clinic in May and found to have significant weight gain but without acute heart failure exacerbation. Was advised to c/w current diuretic therapy and discharged back to ALF. Sharyn Lull, Herbalist, at [facility] confirms that with adherence to low salt diet, he has been back to his dry weight of 302lb. He denies any chest pain, palpitations, dyspnea, or cough.   Discussed with Mr.Biswas regarding his plan to be discharged from ALF to live with his daughter's family. Discussed his need for home health assistance due to his multiple co-morbidities including his lumbar fracture, seizure disorder, dementia, hepatic cirrhosis, mechanical aortic valve and diastolic heart failure. He mentions feeling he will do better at home as he spends most of time at home sleeping and eating at his current living facility and is not getting enough exercise.  Also had prolonged discussion regarding need for close monitoring at home due to risk of falls and his comorbidities as well as frequent dose adjustments for his Coumadin and diuretics. His daughter has expressed understanding of the risks involved. She mentions need for home health and [facility] has provided assistance in this regard in the past.   Past Medical History:  Past Medical History:  Diagnosis Date  . COPD (chronic obstructive pulmonary disease) ([place])   . Dementia ([place])   . Depression   . Diabetes mellitus without complication ([place])   . GERD (gastroesophageal reflux disease)   . H/O diverticulitis of colon  11/03/2012   History of partial colectomy in 2011.  Marland Kitchen Hepatitis C   . High cholesterol   . Lower extremity edema   . Mechanical heart valve present   . Overdose of muscle relaxant 08/11/2017  . Pre-diabetes   . Retroperitoneal hematoma 01/29/2018  . S/P AVR (aortic valve replacement)   . Seizures ([place])   . 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 Negative for chills and fever.  Respiratory: Negative for shortness of breath.   Cardiovascular: Negative for chest pain, palpitations and leg swelling.  Gastrointestinal: Negative for constipation, diarrhea, nausea and vomiting.  Musculoskeletal: Positive for back pain and joint pain.  Neurological: Negative for dizziness and headaches.  All other systems reviewed and are negative.    Assessment / Plan / Recommendations:   Please see A&P under problem oriented charting for assessment of the patient's acute and chronic medical conditions.   As always, pt is advised that if symptoms worsen or new symptoms arise, they should go to an urgent care facility or to to ER for further evaluation.   Consent and Medical Decision Making:   Patient discussed with Dr. Evette Doffing  This is a Video encounter between [name] and [name] on 0/25/8527 for diastolic heart failure. The visit was conducted with the patient located at home and [name] at [facility]. The patient's identity was confirmed using their DOB and current address. The patient has consented to being evaluated through a Video encounter and understands the associated risks (an examination cannot be done and the patient may  need to come in for an appointment) / benefits (allows the patient to remain at home, decreasing exposure to coronavirus). I personally spent 15 minutes on medical discussion.

## 2020-08-04 NOTE — Progress Notes (Signed)
Internal Medicine Clinic Attending  Case discussed with Dr. Lee  At the time of the visit.  We reviewed the resident's history and exam and pertinent patient test results.  I agree with the assessment, diagnosis, and plan of care documented in the resident's note.    

## 2020-08-04 NOTE — Addendum Note (Signed)
Addended by: Lalla Brothers T on: 08/04/2020 12:46 PM   Modules accepted: Level of Service

## 2020-08-10 NOTE — Addendum Note (Signed)
Addended by: Hulan Fray on: 08/10/2020 02:09 PM   Modules accepted: Orders

## 2020-08-12 ENCOUNTER — Emergency Department (HOSPITAL_COMMUNITY): Payer: Medicare Other

## 2020-08-12 ENCOUNTER — Other Ambulatory Visit: Payer: Self-pay

## 2020-08-12 ENCOUNTER — Emergency Department (HOSPITAL_COMMUNITY)
Admission: EM | Admit: 2020-08-12 | Discharge: 2020-08-12 | Disposition: A | Discharge status: Home / Self Care | Payer: Medicare Other | Attending: Emergency Medicine | Admitting: Emergency Medicine

## 2020-08-12 ENCOUNTER — Encounter (HOSPITAL_COMMUNITY): Payer: Self-pay | Admitting: Emergency Medicine

## 2020-08-12 DIAGNOSIS — I959 Hypotension, unspecified: Secondary | ICD-10-CM | POA: Diagnosis not present

## 2020-08-12 DIAGNOSIS — Z20822 Contact with and (suspected) exposure to covid-19: Secondary | ICD-10-CM | POA: Insufficient documentation

## 2020-08-12 DIAGNOSIS — J441 Chronic obstructive pulmonary disease with (acute) exacerbation: Secondary | ICD-10-CM | POA: Diagnosis not present

## 2020-08-12 DIAGNOSIS — R0789 Other chest pain: Secondary | ICD-10-CM | POA: Diagnosis not present

## 2020-08-12 DIAGNOSIS — R600 Localized edema: Secondary | ICD-10-CM | POA: Diagnosis not present

## 2020-08-12 DIAGNOSIS — R079 Chest pain, unspecified: Secondary | ICD-10-CM | POA: Diagnosis not present

## 2020-08-12 DIAGNOSIS — Z87891 Personal history of nicotine dependence: Secondary | ICD-10-CM | POA: Insufficient documentation

## 2020-08-12 DIAGNOSIS — F0390 Unspecified dementia without behavioral disturbance: Secondary | ICD-10-CM | POA: Diagnosis not present

## 2020-08-12 DIAGNOSIS — E119 Type 2 diabetes mellitus without complications: Secondary | ICD-10-CM | POA: Diagnosis not present

## 2020-08-12 DIAGNOSIS — S2231XA Fracture of one rib, right side, initial encounter for closed fracture: Secondary | ICD-10-CM | POA: Diagnosis not present

## 2020-08-12 DIAGNOSIS — R0602 Shortness of breath: Secondary | ICD-10-CM | POA: Diagnosis not present

## 2020-08-12 DIAGNOSIS — Z794 Long term (current) use of insulin: Secondary | ICD-10-CM | POA: Diagnosis not present

## 2020-08-12 DIAGNOSIS — I5030 Unspecified diastolic (congestive) heart failure: Secondary | ICD-10-CM | POA: Diagnosis not present

## 2020-08-12 DIAGNOSIS — Z9660 Presence of unspecified orthopedic joint implant: Secondary | ICD-10-CM | POA: Diagnosis not present

## 2020-08-12 DIAGNOSIS — Z743 Need for continuous supervision: Secondary | ICD-10-CM | POA: Diagnosis not present

## 2020-08-12 LAB — CBC
HCT: 44.3 % (ref 39.0–52.0)
Hemoglobin: 15.1 g/dL (ref 13.0–17.0)
MCH: 34 pg (ref 26.0–34.0)
MCHC: 34.1 g/dL (ref 30.0–36.0)
MCV: 99.8 fL (ref 80.0–100.0)
Platelets: 80 10*3/uL — ABNORMAL LOW (ref 150–400)
RBC: 4.44 MIL/uL (ref 4.22–5.81)
RDW: 14.4 % (ref 11.5–15.5)
WBC: 6.5 10*3/uL (ref 4.0–10.5)
nRBC: 0 % (ref 0.0–0.2)

## 2020-08-12 LAB — BRAIN NATRIURETIC PEPTIDE: B Natriuretic Peptide: 106 pg/mL — ABNORMAL HIGH (ref 0.0–100.0)

## 2020-08-12 LAB — TROPONIN I (HIGH SENSITIVITY)
Troponin I (High Sensitivity): 8 ng/L (ref <18)
Troponin I (High Sensitivity): 7 ng/L (ref <18)

## 2020-08-12 LAB — RESP PANEL BY RT-PCR (FLU A&B, COVID) ARPGX2
Influenza A by PCR: NEGATIVE
Influenza B by PCR: NEGATIVE
SARS Coronavirus 2 by RT PCR: NEGATIVE

## 2020-08-12 LAB — BASIC METABOLIC PANEL
Anion gap: 7 (ref 5–15)
BUN: 22 mg/dL (ref 8–23)
CO2: 28 mmol/L (ref 22–32)
Calcium: 8.4 mg/dL — ABNORMAL LOW (ref 8.9–10.3)
Chloride: 100 mmol/L (ref 98–111)
Creatinine, Ser: 1.35 mg/dL — ABNORMAL HIGH (ref 0.61–1.24)
GFR, Estimated: 56 mL/min — ABNORMAL LOW (ref >60)
Glucose, Bld: 122 mg/dL — ABNORMAL HIGH (ref 70–99)
Potassium: 3.3 mmol/L — ABNORMAL LOW (ref 3.5–5.1)
Sodium: 135 mmol/L (ref 135–145)

## 2020-08-12 LAB — PROTIME-INR
INR: 2.1 — ABNORMAL HIGH (ref 0.8–1.2)
Prothrombin Time: 23.5 seconds — ABNORMAL HIGH (ref 11.4–15.2)

## 2020-08-12 MED ORDER — ALBUTEROL SULFATE HFA 108 (90 BASE) MCG/ACT IN AERS: 2.0000 | INHALATION_SPRAY | RESPIRATORY_TRACT | 2 refills | Status: AC | PRN

## 2020-08-12 MED ORDER — DOXYCYCLINE HYCLATE 100 MG PO CAPS: 100.0000 mg | ORAL_CAPSULE | Freq: Two times a day (BID) | ORAL | 0 refills | Status: AC

## 2020-08-12 NOTE — ED Triage Notes (Addendum)
Pt from [facility] with c/o SOB followed by chest pain. Per EMS, pt showing A-fib on monitor.

## 2020-08-12 NOTE — ED Notes (Signed)
Pt discharged home with daughter Caryl Pina for the weekend,  [facility] notified by Caryl Pina that he will be staying the weekend with her.  Caryl Pina stated she made [facility] aware of the plans.  Assisted pt into car with daughter and discharged home

## 2020-08-12 NOTE — ED Provider Notes (Signed)
[facility] Provider Note   CSN: [id number] Arrival date & time: 08/12/20  0515     History Chief Complaint  Patient presents with  . Chest Pain    Adam Savage is a 70 y.o. male.  Patient presents to the emergency department for evaluation of chest pain and shortness of breath.  Patient reports that he was awakened from sleep about an hour and a half ago with shortness of breath and diffuse chest pain.  He has not identified any factors that improve or worsen his pain.  No associated fever or cough.        Past Medical History:  Diagnosis Date  . COPD (chronic obstructive pulmonary disease) ([place])   . Dementia ([place])   . Depression   . Diabetes mellitus without complication ([place])   . GERD (gastroesophageal reflux disease)   . H/O diverticulitis of colon 11/03/2012   History of partial colectomy in 2011.  Marland Kitchen Hepatitis C   . High cholesterol   . Lower extremity edema   . Mechanical heart valve present   . Overdose of muscle relaxant 08/11/2017  . Pre-diabetes   . Retroperitoneal hematoma 01/29/2018  . S/P AVR (aortic valve replacement)   . Seizures ([place])   . 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 Rectal bleeding   . High cholesterol   . Microcytic anemia 06/17/2019  . Chronic, continuous use of opioids 06/17/2019  . Hepatic cirrhosis due to chronic hepatitis C infection ([place]) 09/16/2018  . Compression fracture of L1 lumbar vertebra ([place]) 06/18/2018  . H/O: GI bleed 06/18/2018  . Dementia associated with other underlying disease with behavioral disturbance ([place])   . S/P AVR (aortic valve replacement)   . Chronic hepatitis C without hepatic coma ([place]) 01/12/2018  . Diastolic CHF due to valvular disease ([place]) 12/23/2017  . Chronic anticoagulation   . Seizure disorder ([place])   . COPD (chronic obstructive pulmonary disease) ([place])   . Type II diabetes mellitus ([place])   . Obesity 09/21/2015   . OSA (obstructive sleep apnea) 05/05/2015  . Tension headache, chronic 08/25/2013    Past Surgical History:  Procedure Laterality Date  . CARDIAC SURGERY    . CHOLECYSTECTOMY    . COLON SURGERY    . COLONOSCOPY WITH PROPOFOL N/A 11/25/2019   Procedure: COLONOSCOPY WITH PROPOFOL;  Surgeon: Rogene Houston, MD;  [facility];  Service: Endoscopy;  Laterality: N/A;  . CORONARY/GRAFT ANGIOGRAPHY N/A 10/31/2017   Procedure: CORONARY/GRAFT ANGIOGRAPHY;  Surgeon: Nelva Bush, MD;  Location: [facility] CV LAB;  Service: Cardiovascular;  Laterality: N/A;  . ESOPHAGOGASTRODUODENOSCOPY (EGD) WITH PROPOFOL N/A 01/18/2018   Procedure: ESOPHAGOGASTRODUODENOSCOPY (EGD) WITH PROPOFOL;  Surgeon: Mauri Pole, MD;  Location: [facility];  Service: Endoscopy;  Laterality: N/A;  . JOINT REPLACEMENT    . KNEE SURGERY Left   . POLYPECTOMY  11/25/2019   Procedure: POLYPECTOMY;  Surgeon: Rogene Houston, MD;  Location: [facility];  Service: Endoscopy;;  . tooth pulled  2021       Family History  Problem Relation Age of Onset  . Hypertension Father     Social History   Tobacco Use  . Smoking status: Former Smoker    Packs/day: 1.00    Types: Cigarettes  . Smokeless tobacco: Never Used  Vaping Use  . Vaping Use: Never used  Substance Use Topics  . Alcohol use: Not Currently  .  Drug use: Never    Home Medications Prior to Admission medications   Medication Sig Start Date End Date Taking? Authorizing Provider  doxycycline (VIBRAMYCIN) 100 MG capsule Take 1 capsule (100 mg total) by mouth 2 (two) times daily. 08/12/20  Yes Jamael Hoffmann, Gwenyth Allegra, MD  albuterol (VENTOLIN HFA) 108 (90 Base) MCG/ACT inhaler Inhale 2 puffs into the lungs every 4 (four) hours as needed for wheezing or shortness of breath. 08/12/20   Orpah Greek, MD  atorvastatin (LIPITOR) 10 MG tablet Take 1 tablet (10 mg total) by mouth daily. 08/02/20   [name], MD  bacitracin ointment  Apply 1 application topically daily. 11/11/19   Kalman Shan Ratliff, DO  carvedilol (COREG) 6.25 MG tablet Take 1 tablet (6.25 mg total) by mouth 2 (two) times daily with a meal. 10/20/19   [name], MD  chlorhexidine (PERIDEX) 0.12 % solution Use as directed 15 mLs in the mouth or throat 2 (two) times daily.    [provider]  escitalopram (LEXAPRO) 10 MG tablet Take 1 tablet (10 mg total) by mouth at bedtime. 11/10/19   [name], MD  ferrous sulfate 325 (65 FE) MG EC tablet Take 1 tablet (325 mg total) by mouth 2 (two) times daily. 11/23/19 11/22/20  Kathie Dike, MD  furosemide (LASIX) 80 MG tablet Take 80 mg by mouth 2 (two) times daily. 10/28/19   [provider]  HYDROcodone-acetaminophen (NORCO/VICODIN) 5-325 MG tablet Take 1 tablet by mouth every 8 (eight) hours as needed for moderate pain. No more than 2 tablets per day. 08/02/20   [name], MD  insulin aspart (NOVOLOG FLEXPEN) 100 UNIT/ML FlexPen INJECT 10 UNITS INTO THE SKIN 3 (THREE) TIMES DAILY WITH MEALS. ADJUST DOSE AS NEEDED 03/19/19   [name], MD  insulin detemir (LEVEMIR) 100 UNIT/ML injection Inject 0.28 mLs (28 Units total) into the skin at bedtime. Increase Levemir by 2 units when the week average is >200. 11/06/18   [name], MD  Insulin Pen Needle (ADVOCATE INSULIN PEN NEEDLES) 31G X 5 MM MISC 1 Package by Does not apply route 4 (four) times daily. 02/03/19   [name], MD  lactulose (CHRONULAC) 10 GM/15ML solution Take 15 mLs (10 g total) by mouth 2 (two) times daily. 06/15/19   [name], MD  Lancets MISC 1 Stick by Does not apply route 3 (three) times daily with meals. 06/22/19   [name], MD  levETIRAcetam (KEPPRA) 1000 MG tablet Take 1 tablet (1,000 mg total) by mouth 2 (two) times daily. 09/17/18   Cameron Sprang, MD  memantine (NAMENDA) 5 MG tablet Take 5 mg by mouth 2 (two) times daily.    [provider]  [facility] ULTRA test strip TEST BLOOD SUGAR 3 TIMES DAILY  BEFORE MEALS 07/12/20   Velna Ochs, MD  pantoprazole (PROTONIX) 40 MG tablet Take 1 tablet (40 mg total) by mouth daily. 07/14/18   [name], MD  QUEtiapine (SEROQUEL) 100 MG tablet TAKE 1 TABLETS IN THE EVENING 11/10/19   [name], MD  QUEtiapine (SEROQUEL) 25 MG tablet Take 1 tablet in AM 08/16/19   Cameron Sprang, MD  spironolactone (ALDACTONE) 50 MG tablet Take 50 mg by mouth daily.    [provider]  warfarin (COUMADIN) 4 MG tablet Take 1 tablet (4 mg total) by mouth daily. 07/14/18   [name], MD    Allergies    Oxycodone, Ativan [lorazepam],  and Morphine and related  Review of Systems   Review of Systems  Respiratory: Positive for shortness of breath.   Cardiovascular: Positive for chest pain.  All other systems reviewed and are negative.   Physical Exam Updated Vital Signs BP (!) 118/56   Pulse 73   Temp 97.8 F (36.6 C) (Oral)   Resp 17   Ht 6' (1.829 m)   Wt 136.1 kg   SpO2 94%   BMI 40.69 kg/m   Physical Exam Vitals and nursing note reviewed.  Constitutional:      General: He is not in acute distress.    Appearance: Normal appearance. He is well-developed.  HENT:     Head: Normocephalic and atraumatic.     Right Ear: Hearing normal.     Left Ear: Hearing normal.     Nose: Nose normal.  Eyes:     Conjunctiva/sclera: Conjunctivae normal.     Pupils: Pupils are equal, round, and reactive to light.  Cardiovascular:     Rate and Rhythm: Regular rhythm.     Heart sounds: S1 normal and S2 normal. No murmur heard. No friction rub. No gallop.   Pulmonary:     Effort: Pulmonary effort is normal. No respiratory distress.     Breath sounds: Normal breath sounds.  Chest:     Chest wall: No tenderness.  Abdominal:     General: Bowel sounds are normal.     Palpations: Abdomen is soft.     Tenderness: There is no abdominal tenderness. There is no guarding or rebound. Negative signs include Murphy's sign and McBurney's sign.     Hernia: No  hernia is present.  Musculoskeletal:        General: Normal range of motion.     Cervical back: Normal range of motion and neck supple.     Right lower leg: Edema present.     Left lower leg: Edema present.  Skin:    General: Skin is warm and dry.     Findings: No rash.  Neurological:     Mental Status: He is alert and oriented to person, place, and time.     GCS: GCS eye subscore is 4. GCS verbal subscore is 5. GCS motor subscore is 6.     Cranial Nerves: No cranial nerve deficit.     Sensory: No sensory deficit.     Coordination: Coordination normal.  Psychiatric:        Speech: Speech normal.        Behavior: Behavior normal.        Thought Content: Thought content normal.     ED Results / Procedures / Treatments   Labs (all labs ordered are listed, but only abnormal results are displayed) Labs Reviewed  BASIC METABOLIC PANEL - Abnormal; Notable for the following components:      Result Value   Potassium 3.3 (*)    Glucose, Bld 122 (*)    Creatinine, Ser 1.35 (*)    Calcium 8.4 (*)    GFR, Estimated 56 (*)    All other components within normal limits  CBC - Abnormal; Notable for the following components:   Platelets 80 (*)    All other components within normal limits  PROTIME-INR - Abnormal; Notable for the following components:   Prothrombin Time 23.5 (*)    INR 2.1 (*)    All other components within normal limits  BRAIN NATRIURETIC PEPTIDE - Abnormal; Notable for the following components:   B Natriuretic Peptide 106.0 (*)  All other components within normal limits  RESP PANEL BY RT-PCR (FLU A&B, COVID) ARPGX2  TROPONIN I (HIGH SENSITIVITY)  TROPONIN I (HIGH SENSITIVITY)    EKG EKG Interpretation  Date/Time:  Saturday August 12 2020 05:28:05 EDT Ventricular Rate:  73 PR Interval:  221 QRS Duration: 95 QT Interval:  415 QTC Calculation: 458 R Axis:   -52 Text Interpretation: Sinus rhythm Atrial premature complexes Prolonged PR interval Abnormal R-wave  progression, early transition Inferior infarct, old Confirmed by Orpah Greek [phone]) on 08/12/2020 5:43:44 AM   Radiology DG Chest Portable 1 View  Result Date: 08/12/2020 CLINICAL DATA:  Chest pain and shortness of breath. EXAM: PORTABLE CHEST 1 VIEW COMPARISON:  11/30/2019 FINDINGS: The lungs are clear without focal pneumonia, edema, pneumothorax or pleural effusion. Cardiopericardial silhouette is at upper limits of normal for size. Bones are diffusely demineralized. Old right rib fractures evident. Telemetry leads overlie the chest. IMPRESSION: No active disease. Electronically Signed   By: Misty Stanley M.D.   On: 08/12/2020 07:27    Procedures Procedures   Medications Ordered in ED Medications - No data to display  ED Course  I have reviewed the triage vital signs and the nursing notes.  Pertinent labs & imaging results that were available during my care of the patient were reviewed by me and considered in my medical decision making (see chart for details).    MDM Rules/Calculators/A&P                          Patient in no distress at arrival.  He reports that he woke up feeling short of breath and then felt tightness in his chest.  EKG unchanged from previous.  Troponins negative.  No evidence of congestive heart failure.  Chest x-ray clear, no pneumonia.  No evidence of edema or volume overload.  No suggestion of PE.  Patient therapeutically anticoagulated on Coumadin secondary to heart valve.    Reviewing his records reveals he does carry history of COPD.  Suspect he might of been having some slight symptoms secondary to COPD.  Currently in the department he is pain-free.  His tightness and discomfort in the chest has completely resolved.  Upon reevaluation he was sleeping.  I woke him up and he reports that his breathing is improved and he no longer has any discomfort.  With a negative work-up, I do not feel he requires hospitalization.  Treat for COPD exacerbation,  follow-up as needed.  Final Clinical Impression(s) / ED Diagnoses Final diagnoses:  Chronic obstructive pulmonary disease with acute exacerbation ([place])    Rx / DC Orders ED Discharge Orders         Ordered    doxycycline (VIBRAMYCIN) 100 MG capsule  2 times daily        08/12/20 0809    albuterol (VENTOLIN HFA) 108 (90 Base) MCG/ACT inhaler  Every 4 hours PRN        08/12/20 0809           Orpah Greek, MD 08/12/20 [phone]

## 2020-08-21 ENCOUNTER — Telehealth: Payer: Self-pay | Admitting: Internal Medicine

## 2020-08-21 ENCOUNTER — Telehealth: Payer: Self-pay

## 2020-08-21 ENCOUNTER — Telehealth: Payer: Self-pay | Admitting: Interventional Cardiology

## 2020-08-21 ENCOUNTER — Telehealth: Payer: Self-pay | Admitting: *Deleted

## 2020-08-21 DIAGNOSIS — Z7902 Long term (current) use of antithrombotics/antiplatelets: Secondary | ICD-10-CM | POA: Diagnosis not present

## 2020-08-21 DIAGNOSIS — B182 Chronic viral hepatitis C: Secondary | ICD-10-CM | POA: Diagnosis not present

## 2020-08-21 DIAGNOSIS — K219 Gastro-esophageal reflux disease without esophagitis: Secondary | ICD-10-CM | POA: Diagnosis not present

## 2020-08-21 DIAGNOSIS — E119 Type 2 diabetes mellitus without complications: Secondary | ICD-10-CM | POA: Diagnosis not present

## 2020-08-21 DIAGNOSIS — I5032 Chronic diastolic (congestive) heart failure: Secondary | ICD-10-CM | POA: Diagnosis not present

## 2020-08-21 DIAGNOSIS — Z9181 History of falling: Secondary | ICD-10-CM | POA: Diagnosis not present

## 2020-08-21 DIAGNOSIS — Z7901 Long term (current) use of anticoagulants: Secondary | ICD-10-CM | POA: Diagnosis not present

## 2020-08-21 DIAGNOSIS — S32010S Wedge compression fracture of first lumbar vertebra, sequela: Secondary | ICD-10-CM | POA: Diagnosis not present

## 2020-08-21 DIAGNOSIS — G4089 Other seizures: Secondary | ICD-10-CM | POA: Diagnosis not present

## 2020-08-21 DIAGNOSIS — J449 Chronic obstructive pulmonary disease, unspecified: Secondary | ICD-10-CM | POA: Diagnosis not present

## 2020-08-21 DIAGNOSIS — K7469 Other cirrhosis of liver: Secondary | ICD-10-CM | POA: Diagnosis not present

## 2020-08-21 DIAGNOSIS — Z794 Long term (current) use of insulin: Secondary | ICD-10-CM | POA: Diagnosis not present

## 2020-08-21 DIAGNOSIS — R2681 Unsteadiness on feet: Secondary | ICD-10-CM | POA: Diagnosis not present

## 2020-08-21 DIAGNOSIS — Z741 Need for assistance with personal care: Secondary | ICD-10-CM | POA: Diagnosis not present

## 2020-08-21 DIAGNOSIS — F0281 Dementia in other diseases classified elsewhere with behavioral disturbance: Secondary | ICD-10-CM | POA: Diagnosis not present

## 2020-08-21 NOTE — Telephone Encounter (Signed)
Adam Savage w/ [facility]- new name Adam Savage-  is needing orders for INR   [phone]

## 2020-08-21 NOTE — Telephone Encounter (Signed)
Pt was d/c from [facility] and is back at his daughter's house.  Cay Schillings RN CHENID an order to check INR week of 5/16.

## 2020-08-21 NOTE — Telephone Encounter (Signed)
Adam Savage from [place] [facility] is requesting a call back for verbal orders  For pt to get Physical therapy .. she stated you can go ahead and leave a detailed message on phone if she does not get to the phone in time

## 2020-08-21 NOTE — Telephone Encounter (Signed)
Similar weight to when I saw him on 2021.  As long as he is not having any symptoms, he can continue his diuretics at the current dose, and continue to monitor his weight.  Watch for any increases more than 3 lbs.   JV

## 2020-08-21 NOTE — Telephone Encounter (Signed)
I agree with this plan.   Thanks,   Ladue  Internal Medicine Resident PGY-1 [facility]  Pager: [phone]

## 2020-08-21 NOTE — Telephone Encounter (Signed)
RN returned call to Kathlee Nations with [facility] who states patient just returned home from an assisted living facility on 5/2. Patients weight has been fluctuating around the 306 range since returning home. Kathlee Nations states patient has no edema or shortness of breath. Patient also has liver cirrhosis and occasional ascites is noted. Patient is currently taking lasix 80mg  BID and Spironolactone 50mg  daily. Kathlee Nations was wondering if Dr.Varanasi had any specific weight parameters he recommended. RN advised I would send Dr. Irish Lack and his nurse a message to advise and give her a call back when they return to the office. Kathlee Nations verbalized understanding. RN encouraged liz to contact the office with any changes.

## 2020-08-21 NOTE — Telephone Encounter (Signed)
RTC to [facility], Erline Levine PT is wanting a VO for PT: 2X/week for 3 weeks 1X/week for 5weeks To work on strength, conditioning VO given Will route to yellow team for agreement or denial SChaplin, RN,BSN

## 2020-08-21 NOTE — Telephone Encounter (Signed)
Kathlee Nations w/ [facility]- new name [facility]-  is needing orders for IN

## 2020-08-21 NOTE — Telephone Encounter (Signed)
RTC to [facility] nurse with [facility] [place] (new name).  She is calling to report the following drug interactions:  Level 2 interactions: Lexapro and Quetiapine (prolonged QT interval) Lexapro and Coumadin (risk for bleeding; will check INR on 5/17)  Level 3 interactions: Albuterol and Carvedilol Carvedilol and Levemir Carvedilol and Novolog Hydrocodone and Quetiapine Hydrocodone - Coumadin  She states she is required to inform PCP.  Level 1 reactions are most severe with Level 4 reactions least severe.    States Pts  Weight was 303.6lb yesterday and is 306.2lb today and she will be notifying Cardiology office with weight gain change. SChaplin, RN,BSN

## 2020-08-21 NOTE — Telephone Encounter (Signed)
     Kathlee Nations with [facility] calling, she said pt's weight yesterday was 303.6 lbs and today it is 306.2 lbs, there's a weight gained of 3 lbs in 24 hours. Also, she wanted to ask weight parameters from Dr. Irish Lack

## 2020-08-21 NOTE — Telephone Encounter (Signed)
[facility] Nurse calling to report a Drug Interaction with a Medication.  Please call back.

## 2020-08-22 NOTE — Telephone Encounter (Signed)
I spoke with Kathlee Nations and gave her message from Dr Irish Lack.

## 2020-08-22 NOTE — Telephone Encounter (Signed)
Kathlee Nations, RN with [facility] called regarding below interactions. Please call back to let her know if there are changes or no changes. Thank you.

## 2020-08-23 NOTE — Telephone Encounter (Signed)
Kathlee Nations, RN with [facility] [place] called back. Made her aware that there will be no med changes at this time.

## 2020-08-23 NOTE — Telephone Encounter (Signed)
Hello Lauren. Would you be able to call Kathlee Nations and let her know that no medication changes are recommended. Thank you!

## 2020-08-23 NOTE — Telephone Encounter (Signed)
Returned call to [place]. VM not identified, no information left. Left message on VM requesting return call.

## 2020-08-24 ENCOUNTER — Other Ambulatory Visit: Payer: Self-pay

## 2020-08-24 DIAGNOSIS — F0281 Dementia in other diseases classified elsewhere with behavioral disturbance: Secondary | ICD-10-CM | POA: Diagnosis not present

## 2020-08-24 DIAGNOSIS — F1190 Opioid use, unspecified, uncomplicated: Secondary | ICD-10-CM

## 2020-08-24 DIAGNOSIS — I5032 Chronic diastolic (congestive) heart failure: Secondary | ICD-10-CM | POA: Diagnosis not present

## 2020-08-24 DIAGNOSIS — D509 Iron deficiency anemia, unspecified: Secondary | ICD-10-CM

## 2020-08-24 DIAGNOSIS — G4089 Other seizures: Secondary | ICD-10-CM | POA: Diagnosis not present

## 2020-08-24 DIAGNOSIS — E119 Type 2 diabetes mellitus without complications: Secondary | ICD-10-CM

## 2020-08-24 DIAGNOSIS — S32010S Wedge compression fracture of first lumbar vertebra, sequela: Secondary | ICD-10-CM

## 2020-08-24 DIAGNOSIS — I5030 Unspecified diastolic (congestive) heart failure: Secondary | ICD-10-CM

## 2020-08-24 DIAGNOSIS — Z794 Long term (current) use of insulin: Secondary | ICD-10-CM

## 2020-08-24 DIAGNOSIS — J449 Chronic obstructive pulmonary disease, unspecified: Secondary | ICD-10-CM | POA: Diagnosis not present

## 2020-08-24 DIAGNOSIS — B182 Chronic viral hepatitis C: Secondary | ICD-10-CM

## 2020-08-24 DIAGNOSIS — K7460 Unspecified cirrhosis of liver: Secondary | ICD-10-CM

## 2020-08-24 DIAGNOSIS — G40009 Localization-related (focal) (partial) idiopathic epilepsy and epileptic syndromes with seizures of localized onset, not intractable, without status epilepticus: Secondary | ICD-10-CM

## 2020-08-24 DIAGNOSIS — I38 Endocarditis, valve unspecified: Secondary | ICD-10-CM

## 2020-08-24 DIAGNOSIS — Z7901 Long term (current) use of anticoagulants: Secondary | ICD-10-CM

## 2020-08-24 DIAGNOSIS — F02818 Dementia in other diseases classified elsewhere, unspecified severity, with other behavioral disturbance: Secondary | ICD-10-CM

## 2020-08-24 DIAGNOSIS — K219 Gastro-esophageal reflux disease without esophagitis: Secondary | ICD-10-CM | POA: Diagnosis not present

## 2020-08-24 DIAGNOSIS — G40909 Epilepsy, unspecified, not intractable, without status epilepticus: Secondary | ICD-10-CM

## 2020-08-24 DIAGNOSIS — Z8719 Personal history of other diseases of the digestive system: Secondary | ICD-10-CM

## 2020-08-24 MED ORDER — WARFARIN SODIUM 4 MG PO TABS: 4.0000 mg | ORAL_TABLET | Freq: Every day | ORAL | 3 refills | Status: AC

## 2020-08-24 MED ORDER — ESCITALOPRAM OXALATE 10 MG PO TABS: 10.0000 mg | ORAL_TABLET | Freq: Every day | ORAL | 3 refills | Status: AC

## 2020-08-24 MED ORDER — SPIRONOLACTONE 50 MG PO TABS: 50.0000 mg | ORAL_TABLET | Freq: Every day | ORAL | 3 refills | Status: AC

## 2020-08-24 MED ORDER — QUETIAPINE FUMARATE 100 MG PO TABS: ORAL_TABLET | ORAL | 3 refills | Status: AC

## 2020-08-24 MED ORDER — NOVOLOG FLEXPEN 100 UNIT/ML ~~LOC~~ SOPN: PEN_INJECTOR | SUBCUTANEOUS | 1 refills | Status: DC

## 2020-08-24 MED ORDER — QUETIAPINE FUMARATE 25 MG PO TABS: ORAL_TABLET | ORAL | 3 refills | Status: AC

## 2020-08-24 MED ORDER — LACTULOSE 10 GM/15ML PO SOLN: 10.0000 g | Freq: Two times a day (BID) | ORAL | 3 refills | Status: DC

## 2020-08-24 MED ORDER — MEMANTINE HCL 5 MG PO TABS: 5.0000 mg | ORAL_TABLET | Freq: Two times a day (BID) | ORAL | 3 refills | Status: AC

## 2020-08-24 MED ORDER — ATORVASTATIN CALCIUM 10 MG PO TABS: 10.0000 mg | ORAL_TABLET | Freq: Every day | ORAL | 3 refills | Status: AC

## 2020-08-24 MED ORDER — LEVETIRACETAM 1000 MG PO TABS: 1000.0000 mg | ORAL_TABLET | Freq: Two times a day (BID) | ORAL | 3 refills | Status: AC

## 2020-08-24 MED ORDER — CARVEDILOL 6.25 MG PO TABS: 6.2500 mg | ORAL_TABLET | Freq: Two times a day (BID) | ORAL | 1 refills | Status: AC

## 2020-08-24 MED ORDER — FERROUS SULFATE 325 (65 FE) MG PO TBEC: 325.0000 mg | DELAYED_RELEASE_TABLET | Freq: Two times a day (BID) | ORAL | 3 refills | Status: AC

## 2020-08-24 MED ORDER — PANTOPRAZOLE SODIUM 40 MG PO TBEC: 40.0000 mg | DELAYED_RELEASE_TABLET | Freq: Every day | ORAL | 3 refills | Status: AC

## 2020-08-24 MED ORDER — HYDROCODONE-ACETAMINOPHEN 5-325 MG PO TABS: 1.0000 | ORAL_TABLET | Freq: Three times a day (TID) | ORAL | 0 refills | Status: DC | PRN

## 2020-08-24 NOTE — Telephone Encounter (Signed)
Pt daughter needs pain medicine refills and all his medicine refill  [phone]  Pt is completed out  [id number] [place] [place] [facility] [address]

## 2020-08-24 NOTE — Telephone Encounter (Signed)
Pt's daughter stated she called [place] who stated they cannot transfer hydrocodone rx since it's a control med. Stated our office need to call to cancel rx and have the doctor [facility] I called [place] - talked to [place] who stated she could not find pt in their system; he had been deactivated. Called pt's daughter back who stated she had talked to [place] but "they was no help". Stated pt received #30 tabs from [facility] on 4/20 and has 5-6 tabs left.

## 2020-08-24 NOTE — Telephone Encounter (Signed)
Pt daughter also wants pt to get a referral to pain management

## 2020-08-24 NOTE — Telephone Encounter (Signed)
I [facility]. Also received message requiring family request for referral to pain clinic. Based on his comorbidities, including concomittant psych diagnosis. I feel it would be more appropriate for referral to palliative care who can assist with pain control as well as refining long term care goals. Will attempt to reach out to daughter regarding this recommendation.

## 2020-08-24 NOTE — Telephone Encounter (Signed)
Hydrocodone #60 sent to [facility] on 08/02/20. Call placed to daughter. [facility] only gave them 30 tabs and won't transfer Rx [facility]. Patient has ~6 tabs left. Advised her to contact [facility] and ask them to transfer the remaining 30 tabs from [place]. She will also ask them to transfer atorvastatin. Went through entire med list with daughter and will forward needed refills to Yellow Team.  Patient had been going to [facility] [place] for INR check but now a [place] from [facility] is scheduled to check INR next week.

## 2020-08-29 DIAGNOSIS — F0281 Dementia in other diseases classified elsewhere with behavioral disturbance: Secondary | ICD-10-CM | POA: Diagnosis not present

## 2020-08-29 DIAGNOSIS — I5032 Chronic diastolic (congestive) heart failure: Secondary | ICD-10-CM | POA: Diagnosis not present

## 2020-08-29 DIAGNOSIS — K219 Gastro-esophageal reflux disease without esophagitis: Secondary | ICD-10-CM | POA: Diagnosis not present

## 2020-08-29 DIAGNOSIS — E119 Type 2 diabetes mellitus without complications: Secondary | ICD-10-CM | POA: Diagnosis not present

## 2020-08-29 DIAGNOSIS — G4089 Other seizures: Secondary | ICD-10-CM | POA: Diagnosis not present

## 2020-08-29 DIAGNOSIS — J449 Chronic obstructive pulmonary disease, unspecified: Secondary | ICD-10-CM | POA: Diagnosis not present

## 2020-08-31 ENCOUNTER — Ambulatory Visit (INDEPENDENT_AMBULATORY_CARE_PROVIDER_SITE_OTHER): Payer: Medicare Other | Admitting: *Deleted

## 2020-08-31 DIAGNOSIS — Z5181 Encounter for therapeutic drug level monitoring: Secondary | ICD-10-CM

## 2020-08-31 DIAGNOSIS — Z952 Presence of prosthetic heart valve: Secondary | ICD-10-CM

## 2020-08-31 DIAGNOSIS — E119 Type 2 diabetes mellitus without complications: Secondary | ICD-10-CM | POA: Diagnosis not present

## 2020-08-31 DIAGNOSIS — K219 Gastro-esophageal reflux disease without esophagitis: Secondary | ICD-10-CM | POA: Diagnosis not present

## 2020-08-31 DIAGNOSIS — G4089 Other seizures: Secondary | ICD-10-CM | POA: Diagnosis not present

## 2020-08-31 DIAGNOSIS — J449 Chronic obstructive pulmonary disease, unspecified: Secondary | ICD-10-CM | POA: Diagnosis not present

## 2020-08-31 DIAGNOSIS — I5032 Chronic diastolic (congestive) heart failure: Secondary | ICD-10-CM | POA: Diagnosis not present

## 2020-08-31 DIAGNOSIS — F0281 Dementia in other diseases classified elsewhere with behavioral disturbance: Secondary | ICD-10-CM | POA: Diagnosis not present

## 2020-08-31 LAB — POCT INR: INR: 2 (ref 2.0–3.0)

## 2020-08-31 NOTE — Patient Instructions (Signed)
Continue warfarin 1 tablet daily except 1/2 tablet on Mondays.  Order given to Katherine Basset RN [facility] Recheck INR in 2 weeks by [facility]

## 2020-09-01 ENCOUNTER — Telehealth: Payer: Self-pay | Admitting: Internal Medicine

## 2020-09-01 DIAGNOSIS — F0281 Dementia in other diseases classified elsewhere with behavioral disturbance: Secondary | ICD-10-CM

## 2020-09-01 DIAGNOSIS — F1190 Opioid use, unspecified, uncomplicated: Secondary | ICD-10-CM

## 2020-09-01 DIAGNOSIS — F02818 Dementia in other diseases classified elsewhere, unspecified severity, with other behavioral disturbance: Secondary | ICD-10-CM

## 2020-09-01 NOTE — Telephone Encounter (Signed)
Patient's daughter, Caryl Pina, called wanting to know the status of a referral for pain management.  Please give her call back.

## 2020-09-02 ENCOUNTER — Other Ambulatory Visit: Payer: Self-pay | Admitting: Internal Medicine

## 2020-09-02 ENCOUNTER — Encounter: Payer: Self-pay | Admitting: *Deleted

## 2020-09-02 DIAGNOSIS — B182 Chronic viral hepatitis C: Secondary | ICD-10-CM

## 2020-09-02 DIAGNOSIS — Z794 Long term (current) use of insulin: Secondary | ICD-10-CM

## 2020-09-02 DIAGNOSIS — E119 Type 2 diabetes mellitus without complications: Secondary | ICD-10-CM

## 2020-09-02 DIAGNOSIS — K7460 Unspecified cirrhosis of liver: Secondary | ICD-10-CM

## 2020-09-04 NOTE — Telephone Encounter (Signed)
furosemide (LASIX) 80 MG tablet  insulin detemir (LEVEMIR) 100 UNIT/ML injection, refill request @  [id number] [place], [place] [facility] Phone:  [phone]  Fax:  [phone]

## 2020-09-04 NOTE — Telephone Encounter (Signed)
A message has been sent to Dr. Truman Hayward and the Yellow team in reference to a Pain Clinic Referral.

## 2020-09-04 NOTE — Telephone Encounter (Signed)
Patient's daughter, Caryl Pina, called wanting to know the status of a referral for pain management.  Please advise if a Referral was to be placed per message on 08/24/2020.

## 2020-09-05 DIAGNOSIS — F0281 Dementia in other diseases classified elsewhere with behavioral disturbance: Secondary | ICD-10-CM | POA: Diagnosis not present

## 2020-09-05 DIAGNOSIS — I5032 Chronic diastolic (congestive) heart failure: Secondary | ICD-10-CM | POA: Diagnosis not present

## 2020-09-05 DIAGNOSIS — K219 Gastro-esophageal reflux disease without esophagitis: Secondary | ICD-10-CM | POA: Diagnosis not present

## 2020-09-05 DIAGNOSIS — E119 Type 2 diabetes mellitus without complications: Secondary | ICD-10-CM | POA: Diagnosis not present

## 2020-09-05 DIAGNOSIS — J449 Chronic obstructive pulmonary disease, unspecified: Secondary | ICD-10-CM | POA: Diagnosis not present

## 2020-09-05 DIAGNOSIS — G4089 Other seizures: Secondary | ICD-10-CM | POA: Diagnosis not present

## 2020-09-05 MED ORDER — FUROSEMIDE 80 MG PO TABS: 80.0000 mg | ORAL_TABLET | Freq: Two times a day (BID) | ORAL | 7 refills | Status: AC

## 2020-09-05 MED ORDER — INSULIN DETEMIR 100 UNIT/ML ~~LOC~~ SOLN: 28.0000 [IU] | Freq: Every day | SUBCUTANEOUS | 3 refills | Status: DC

## 2020-09-05 NOTE — Telephone Encounter (Signed)
Spoke with [name] at length regarding her request for pain clinic referral. Discussed with her regarding [name] significant amount of comorbidities and importance of setting goals of care plans. Also discussed option of referring to palliative for symptom management rather than pain clinic as palliative care can work with [name] on advanced care planning rather than only focusing on his pain and chronic opioid use. [name] expressed understanding and agrees with the plan. Will make referral for outpatient palliative care.

## 2020-09-06 ENCOUNTER — Other Ambulatory Visit: Payer: Self-pay | Admitting: Internal Medicine

## 2020-09-06 ENCOUNTER — Telehealth: Payer: Self-pay | Admitting: Nurse Practitioner

## 2020-09-06 DIAGNOSIS — F1190 Opioid use, unspecified, uncomplicated: Secondary | ICD-10-CM

## 2020-09-06 NOTE — Telephone Encounter (Signed)
Spoke with patient's daughter, Caryl Pina, regarding the Palliative referral/services and all questions were answered and she was in agreement with starting services.  I have scheduled an In-home Consult for 09/07/20 @ 1 PM.

## 2020-09-06 NOTE — Progress Notes (Signed)
Received message requesting continuing referral process for pain management. Will put in referral.

## 2020-09-07 ENCOUNTER — Other Ambulatory Visit: Payer: Medicare Other | Admitting: Nurse Practitioner

## 2020-09-07 ENCOUNTER — Other Ambulatory Visit: Payer: Self-pay

## 2020-09-07 ENCOUNTER — Encounter: Payer: Self-pay | Admitting: Nurse Practitioner

## 2020-09-07 VITALS — HR 82 | Resp 18

## 2020-09-07 DIAGNOSIS — R0602 Shortness of breath: Secondary | ICD-10-CM

## 2020-09-07 DIAGNOSIS — E119 Type 2 diabetes mellitus without complications: Secondary | ICD-10-CM | POA: Diagnosis not present

## 2020-09-07 DIAGNOSIS — Z515 Encounter for palliative care: Secondary | ICD-10-CM

## 2020-09-07 DIAGNOSIS — J449 Chronic obstructive pulmonary disease, unspecified: Secondary | ICD-10-CM | POA: Diagnosis not present

## 2020-09-07 DIAGNOSIS — I5032 Chronic diastolic (congestive) heart failure: Secondary | ICD-10-CM | POA: Diagnosis not present

## 2020-09-07 DIAGNOSIS — K219 Gastro-esophageal reflux disease without esophagitis: Secondary | ICD-10-CM | POA: Diagnosis not present

## 2020-09-07 DIAGNOSIS — F0281 Dementia in other diseases classified elsewhere with behavioral disturbance: Secondary | ICD-10-CM | POA: Diagnosis not present

## 2020-09-07 DIAGNOSIS — G4089 Other seizures: Secondary | ICD-10-CM | POA: Diagnosis not present

## 2020-09-07 NOTE — Progress Notes (Signed)
[facility] Palliative Care Consult Note Telephone: [phone]  Fax: [phone]    Date of encounter: 09/07/20 PATIENT NAME: Adam Savage [place] [address]   [phone] (home)  DOB: Oct 17, 1950 MRN: [id number] PRIMARY CARE PROVIDER:    [name], MD,  [address] [place] [address] [phone]  RESPONSIBLE PARTY:    Contact Information    Name Relation Home Work [place], [place] Daughter [phone]  [phone]     I met face to face with patient and daughter, Adam Savage in home. Palliative Care was asked to follow this patient by consultation request of  [name], MD to address advance care planning and complex medical decision making. This is the initial visit.  ASSESSMENT AND PLAN / RECOMMENDATIONS:  1. ACP; discussed code status at length; wishes are to be a DNR; Adam Savage endorses she thinks she has the form in his stuff. Offered to complete a new DNR form, Adam Savage wanted to wait. Discussed MOST form, blank form left with Hard Choice book. We talked at length about Hospice services and wishes are for Hospice physicians to review case for eligibility. Discussed with Adam Savage will re-contact once eligibility is determined.   2. Shortness of breath secondary to CHF, COPD, OSA, declined O2 as he continues to smoke. Worse with exertion. We talked about energy conservation; inhalation therapy, monitor respiratory  3. Goals of Care: Goals include to maximize quality of life and symptom management. Our advance care planning conversation included a discussion about:     The value and importance of advance care planning   Exploration of personal, cultural or spiritual beliefs that might influence medical decisions   Exploration of goals of care in the event of a sudden injury or illness   Identification and preparation of a healthcare agent   Review and updating or creation of an advance directive  document.  4. Palliative care encounter; Palliative care encounter; Palliative medicine team will continue to support patient, patient's family, and medical team. Visit consisted of counseling and education dealing with the complex and emotionally intense issues of symptom management and palliative care in the setting of serious and potentially life-threatening illness  Advance Care Planning/Goals of Care: Goals include to maximize quality of life and symptom management. Our advance care planning conversation included a discussion about:     The value and importance of advance care planning   Experiences with loved ones who have been seriously ill or have died   Exploration of personal, cultural or spiritual beliefs that might influence medical decisions   Exploration of goals of care in the event of a sudden injury or illness   Identification and preparation of a healthcare agent   Review and updating or creation of an  advance directive document .  Decision not to resuscitate or to de-escalate disease focused treatments due to poor prognosis.   Symptom Management/Plan:  I spent 75 minutes providing this consultation. More than 50% of the time in this consultation was spent in counseling and care coordination.  PPS: 40%  HOSPICE ELIGIBILITY/DIAGNOSIS: TBD  Chief Complaint: Initial palliative consult for complex medical decision making  HISTORY OF PRESENT ILLNESS:  Adam Savage is a 70 y.o. 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 Hospitalized 11/22/2019 to 11/29/2019 for atypical CP, noncardiac in nature; acute blood loss anemia  secondary to GI bleed s/p 2 units PRBC transfused, COPD. Adam Savage was d/c to ALF. Adam Savage resided in [place] until about 3 months ago when his daughter Adam Savage brought him to her home. I called Adam Savage Mr Savage's daughter, Adam Savage to confirm palliative care initial visit and COVID screening which was negative. Adam Savage in agreement. I visited Mr Savage and his daughter Adam Savage in their home. Mr Savage was lying in his bed asleep. Adam Savage and I met separately. We talked about the last time Mr Savage was independent which was several years ago. 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 Mr Savage has a wifewho is an alcoholic. They are not together and two adult children a daughter who he lives with Adam Savage and a son. We talked about past medical history, chronic disease progression. We talked about functional changes as six months ago. Mr Savage was able to ambulate come on independence. Mr Savage requires assistance for ambulating, adls as he gets very tired walking a few steps at a time. We talked about symptoms of pain which had been debilitating for him in his lower back. We talked about the current pain regiment. We talked about shortness of breath. We talked about the events surrounding when he had his heart attack and coated, CPR and a ventilator. Adam Savage endorses Mr Savage has shared that he would not want to have compressions again. Adam Savage talked about when Adam Savage had a cardiac arrest with intubation. Adam Savage endorses she agreed to a DNR but continues to wonder if that is with the right decision. We talked about the living will that listed not to prolong lifesaving measures. Adam Savage and I reviewed most form blank copy left and hard choice book left. We talked about DNR at length. We talked about Mr Savage appetite as he would prefer to eat sweets. We talked about newly diagnosed diabetes, challenges with compliance. We talked about congestive heart failure at length including edema.   Explored more about shortness of breath as he does not have oxygen he is an avid smoker. We talked about the benefit of oxygen. Mr. Lashomb would likely not use it because smoking gives him pleasure. We talked about endurance and activity. Which is extremely limited as he spends most of his timelying or sitting as ambulating is great difficulty for him. We talked about medical goals of care.We talked about cognitive changes with dementia that continues to progress. We talked about cognitive changes and ability to make good decisions. We talked about sleep pattern. Mr Garrette is able to fall asleep though not sustain as he is up about every three hours through a 24 hour. Adam Savage endorses that he goes to sleep but it does not stay asleep. We talked about obstructive sleep apnea as he does have a cpap machine. Adam Savage endorses the cpap machine they have tried various pieces of equipment, masks and none has been comfortable for Mr Parson so he doesn't use it. Adam Savage talked about the course of events from hospitalizations to [facility] where he resided up until three months ago when Jansen brought him home. We talked about home health with therapy with little progression due to decline in debility.It has been harder for Mr Jokerst to be motivated and with the weakness, fatigue it  makes it harder for him to exercise or ambulate. We talked about current plan with home health as it should be completed soon. We talked about role of palliative care and plan of care. We talked about option of Hospice services under Medicare benefit. We talked about what Hospice services would provide. Adam Savage endorses her mother in law recently passed away with Hospice in [place] at her brother in [place]. We talked about Hospice criteria life expectancy of six months or less and what that does look like. We talked about resources that are provided. We talked about option of having Hospice physicians review case for eligibility. We talked at length about  if wishes are to proceed along the path of Hospice with goals of comfort care aligning with Hospice philosophy it would be able to manage pain more effectively. Discussed that if Mr Jaquith was found not eligible or decision to remain on Palliative with attempt therapy with aggressive interventions will need to defer to pain management clinic. Adam Savage and I talked about the challenges getting Mr Tabb into a car is incredibly difficult with limited functional ability, rapid decline, decompensation overall health. We talked about overall decline. We talked about quality of life versus quantity of days. We talked about concern for overall decline. We talked about caregivers stress and fatigue, self care, coping strategies. We talked about grieving process. I met with Mr Goytia in his room he sleeping awoke. Mr Gravley made eye contact was interactive, shared that he was extremely tired. Mr Dubuque endorses he was continuing to have problems with shortness of breath with any type of exertion. We talked about how he was feeling. Mr Atienza endorses he is just exhausted. We talked about overall decline and ability. Mr Lewman talked about Adam Savage his daughter caring for him in her home. We talked about quality of life, briefly medical goals. We talked about symptoms of pain. We talked about chronic debility. We talked about functional and cognitive decline though limited with cognitive impairment and the ability to process, judgment. Mr Jaggi was cooperative with assessment, smiling and pleasant. Emotional support provided. I visited with Adam Savage after a visit with Mr Stann. We talked about the clinical presentation and concern that Mr Alcoser appears to be Hospice eligible but will verify with Hospice physicians. Adam Savage in agreement. Therapeutic listening, emotional support provided. Adam Savage was very thankful for palliative care visit and discussion period questions answered.  History obtained from review of EMR, discussion with family, and   Mr. Siddoway.  I reviewed available labs, medications, imaging, studies and related documents from the EMR.  Records reviewed and summarized above.   ROS  Full 14 system review of systems performed and negative with exception of: as per HPI.  Physical Exam: Constitutional: NAD General: pale, weak, chronically ill, debilitated obese male EYES:  lids intact ENMT: oral mucous membranes moist CV: S1S2, RRR, + LE edema Pulmonary: poor air movement;  + increased work of breathing with exertion, +chronic cough, room air Abdomen:  normo-active BS + 4 quadrants, obese, non tender, + ascites GU: deferred MSK: slow steps Skin: warm and dry, no rashes or wounds on visible skin Neuro:  + generalized weakness,  mild cognitive impairment Psych: non-anxious affect, Alert  CURRENT PROBLEM LIST:  Patient Active Problem List   Diagnosis Date Noted  . Rectal bleeding   . High cholesterol   . Microcytic anemia 06/17/2019  . Chronic, continuous use of opioids 06/17/2019  . Hepatic cirrhosis due to chronic hepatitis C infection ([place])  09/16/2018  . Compression fracture of L1 lumbar vertebra ([place]) 06/18/2018  . H/O: GI bleed 06/18/2018  . Dementia associated with other underlying disease with behavioral disturbance ([place])   . S/P AVR (aortic valve replacement)   . Chronic hepatitis C without hepatic coma ([place]) 01/12/2018  . Diastolic CHF due to valvular disease ([place]) 12/23/2017  . Chronic anticoagulation   . Seizure disorder ([place])   . COPD (chronic obstructive pulmonary disease) ([place])   . Type II diabetes mellitus ([place])   . Obesity 09/21/2015  . OSA (obstructive sleep apnea) 05/05/2015  . Tension headache, chronic 08/25/2013   PAST MEDICAL HISTORY:  Active Ambulatory Problems    Diagnosis Date Noted  . Seizure disorder ([place])   . COPD (chronic obstructive pulmonary disease) ([place])   . Type II diabetes mellitus ([place])   . Chronic anticoagulation   . OSA (obstructive sleep apnea) 05/05/2015  .  Obesity 09/21/2015  . Tension headache, chronic 08/25/2013  . Diastolic CHF due to valvular disease ([place]) 12/23/2017  . Chronic hepatitis C without hepatic coma ([place]) 01/12/2018  . S/P AVR (aortic valve replacement)   . Dementia associated with other underlying disease with behavioral disturbance ([place])   . Compression fracture of L1 lumbar vertebra ([place]) 06/18/2018  . H/O: GI bleed 06/18/2018  . 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 Microcytic anemia 06/17/2019  . Chronic, continuous use of opioids 06/17/2019  . High cholesterol   . Rectal bleeding    Resolved Ambulatory Problems    Diagnosis Date Noted  . Chest pain 10/28/2017  . Mechanical heart valve present 09/05/2012  . Unstable angina ([place]) 10/29/2017  . Non-ST elevation (NSTEMI) myocardial infarction ([place])   . Encounter for therapeutic drug monitoring 11/06/2017  . Leg swelling 12/22/2017  . Elevated liver enzymes 07/28/2015  . Epilepsy ([place]) 11/03/2012  . Fatty liver disease, nonalcoholic 65/53/7482  . Folic acid deficiency anemia 06/20/2016  . H/O diverticulitis of colon 11/03/2012  . Joint pain 11/12/2013  . Osteoarthritis 07/28/2015  . Overdose of muscle relaxant 08/11/2017  . Status post lobectomy of brain 06/28/2013  . Internal hemorrhoid, bleeding 01/14/2018  . Intertrigo 01/14/2018  . Perianal infection 01/15/2018  . Diarrhea 01/15/2018  . Hypotension 01/15/2018  . Acute blood loss anemia   . Mallory-Weiss tear 01/17/2018  . Acute metabolic encephalopathy   . AKI (acute kidney injury) ([place]) 01/22/2018  . Abdominal distention   . Cardiac arrest with successful resuscitation ([place]) 01/29/2018  . Retroperitoneal hematoma 01/29/2018  . Anticoagulation goal of INR 2 to 3 02/04/2014  . Insomnia 08/25/2013  . Tobacco abuse 10/30/2015  . Vertigo 07/14/2018  . Need for prophylactic vaccination and inoculation against viral hepatitis 03/17/2019  . Hepatic encephalopathy ([place])  05/26/2019  . Bilateral lower extremity edema 09/22/2019  . Accidental fall from bed 11/10/2019  . Chest pain 11/22/2019   Past Medical History:  Diagnosis Date  . Dementia ([place])   . Depression   . Diabetes mellitus without complication ([place])   . GERD (gastroesophageal reflux disease)   . Hepatitis C   . Lower extremity edema   . Pre-diabetes   . Seizures ([place])    SOCIAL HX:  Social History   Tobacco Use  . Smoking status: Former Smoker    Packs/day: 1.00    Types: Cigarettes  . Smokeless tobacco: Never Used  Substance Use Topics  . Alcohol use: Not Currently   FAMILY HX:  Family History  Problem Relation Age of Onset  .  Hypertension Father   reviewed  ALLERGIES:  Allergies  Allergen Reactions  . Oxycodone Anxiety  . Ativan [Lorazepam]   . Morphine And Related Other (See Comments)    Combative      PERTINENT MEDICATIONS:  Outpatient Encounter Medications as of 09/07/2020  Medication Sig  . albuterol (VENTOLIN HFA) 108 (90 Base) MCG/ACT inhaler Inhale 2 puffs into the lungs every 4 (four) hours as needed for wheezing or shortness of breath.  Marland Kitchen atorvastatin (LIPITOR) 10 MG tablet Take 1 tablet (10 mg total) by mouth daily.  . bacitracin ointment Apply 1 application topically daily.  . carvedilol (COREG) 6.25 MG tablet Take 1 tablet (6.25 mg total) by mouth 2 (two) times daily with a meal.  . chlorhexidine (PERIDEX) 0.12 % solution Use as directed 15 mLs in the mouth or throat 2 (two) times daily.  Marland Kitchen doxycycline (VIBRAMYCIN) 100 MG capsule Take 1 capsule (100 mg total) by mouth 2 (two) times daily.  Marland Kitchen escitalopram (LEXAPRO) 10 MG tablet Take 1 tablet (10 mg total) by mouth at bedtime.  . ferrous sulfate 325 (65 FE) MG EC tablet Take 1 tablet (325 mg total) by mouth 2 (two) times daily.  . furosemide (LASIX) 80 MG tablet Take 1 tablet (80 mg total) by mouth 2 (two) times daily.  Marland Kitchen HYDROcodone-acetaminophen (NORCO/VICODIN) 5-325 MG tablet Take 1 tablet by mouth every 8  (eight) hours as needed for moderate pain. No more than 2 tablets per day.  . insulin aspart (NOVOLOG FLEXPEN) 100 UNIT/ML FlexPen INJECT 10 UNITS INTO THE SKIN 3 (THREE) TIMES DAILY WITH MEALS. ADJUST DOSE AS NEEDED  . insulin detemir (LEVEMIR) 100 UNIT/ML injection Inject 0.28 mLs (28 Units total) into the skin at bedtime. Increase Levemir by 2 units when the week average is >200.  Marland Kitchen Insulin Pen Needle (ADVOCATE INSULIN PEN NEEDLES) 31G X 5 MM MISC 1 Package by Does not apply route 4 (four) times daily.  Marland Kitchen lactulose (CHRONULAC) 10 GM/15ML solution TAKE 15 MLS (10 G TOTAL) BY MOUTH 2 (TWO) TIMES DAILY.  Marland Kitchen Lancets MISC 1 Stick by Does not apply route 3 (three) times daily with meals.  . levETIRAcetam (KEPPRA) 1000 MG tablet Take 1 tablet (1,000 mg total) by mouth 2 (two) times daily.  . memantine (NAMENDA) 5 MG tablet Take 1 tablet (5 mg total) by mouth 2 (two) times daily.  Glory Rosebush ULTRA test strip TEST BLOOD SUGAR 3 TIMES DAILY BEFORE MEALS  . pantoprazole (PROTONIX) 40 MG tablet Take 1 tablet (40 mg total) by mouth daily.  . QUEtiapine (SEROQUEL) 100 MG tablet TAKE 1 TABLETS IN THE EVENING  . QUEtiapine (SEROQUEL) 25 MG tablet Take 1 tablet in AM  . spironolactone (ALDACTONE) 50 MG tablet Take 1 tablet (50 mg total) by mouth daily.  Marland Kitchen warfarin (COUMADIN) 4 MG tablet Take 1 tablet (4 mg total) by mouth daily.   No facility-administered encounter medications on file as of 09/07/2020.   Questions and concerns were addressed. The patient/family was encouraged to call with questions and/or concerns. My contact information was provided. Provided general support and encouragement, no other unmet needs identified  Thank you for the opportunity to participate in the care of Mr. Dessert.  The palliative care team will continue to follow. Please call our office at [phone] if we can be of additional assistance.   This chart was dictated using voice recognition software. Despite best efforts to  proofread, errors can occur which can change the documentation meaning.  Omero Kowal Z  Raniya Golembeski, NP , MSN, ACHPN  COVID-19 PATIENT SCREENING TOOL Asked and negative response unless otherwise noted:   Have you had symptoms of covid, tested positive or been in contact with someone with symptoms/positive test in the past 5-10 days? NO

## 2020-09-08 DIAGNOSIS — K219 Gastro-esophageal reflux disease without esophagitis: Secondary | ICD-10-CM | POA: Diagnosis not present

## 2020-09-08 DIAGNOSIS — J449 Chronic obstructive pulmonary disease, unspecified: Secondary | ICD-10-CM | POA: Diagnosis not present

## 2020-09-08 DIAGNOSIS — F0281 Dementia in other diseases classified elsewhere with behavioral disturbance: Secondary | ICD-10-CM | POA: Diagnosis not present

## 2020-09-08 DIAGNOSIS — E119 Type 2 diabetes mellitus without complications: Secondary | ICD-10-CM | POA: Diagnosis not present

## 2020-09-08 DIAGNOSIS — I5032 Chronic diastolic (congestive) heart failure: Secondary | ICD-10-CM | POA: Diagnosis not present

## 2020-09-08 DIAGNOSIS — G4089 Other seizures: Secondary | ICD-10-CM | POA: Diagnosis not present

## 2020-09-08 NOTE — Addendum Note (Signed)
Addended by: [name] on: 09/08/2020 11:17 AM   Modules accepted: Orders

## 2020-09-12 DIAGNOSIS — G40909 Epilepsy, unspecified, not intractable, without status epilepticus: Secondary | ICD-10-CM | POA: Diagnosis not present

## 2020-09-12 DIAGNOSIS — E119 Type 2 diabetes mellitus without complications: Secondary | ICD-10-CM | POA: Diagnosis not present

## 2020-09-12 DIAGNOSIS — G4733 Obstructive sleep apnea (adult) (pediatric): Secondary | ICD-10-CM | POA: Diagnosis not present

## 2020-09-12 DIAGNOSIS — Z9049 Acquired absence of other specified parts of digestive tract: Secondary | ICD-10-CM | POA: Diagnosis not present

## 2020-09-12 DIAGNOSIS — F0150 Vascular dementia without behavioral disturbance: Secondary | ICD-10-CM | POA: Diagnosis not present

## 2020-09-12 DIAGNOSIS — F17200 Nicotine dependence, unspecified, uncomplicated: Secondary | ICD-10-CM | POA: Diagnosis not present

## 2020-09-12 DIAGNOSIS — M549 Dorsalgia, unspecified: Secondary | ICD-10-CM | POA: Diagnosis not present

## 2020-09-12 DIAGNOSIS — K056 Periodontal disease, unspecified: Secondary | ICD-10-CM | POA: Diagnosis not present

## 2020-09-12 DIAGNOSIS — K7460 Unspecified cirrhosis of liver: Secondary | ICD-10-CM | POA: Diagnosis not present

## 2020-09-12 DIAGNOSIS — D631 Anemia in chronic kidney disease: Secondary | ICD-10-CM | POA: Diagnosis not present

## 2020-09-12 DIAGNOSIS — Z8719 Personal history of other diseases of the digestive system: Secondary | ICD-10-CM | POA: Diagnosis not present

## 2020-09-12 DIAGNOSIS — I130 Hypertensive heart and chronic kidney disease with heart failure and stage 1 through stage 4 chronic kidney disease, or unspecified chronic kidney disease: Secondary | ICD-10-CM | POA: Diagnosis not present

## 2020-09-12 DIAGNOSIS — Z8674 Personal history of sudden cardiac arrest: Secondary | ICD-10-CM | POA: Diagnosis not present

## 2020-09-12 DIAGNOSIS — J449 Chronic obstructive pulmonary disease, unspecified: Secondary | ICD-10-CM | POA: Diagnosis not present

## 2020-09-12 DIAGNOSIS — Z87311 Personal history of (healed) other pathological fracture: Secondary | ICD-10-CM | POA: Diagnosis not present

## 2020-09-12 DIAGNOSIS — B1920 Unspecified viral hepatitis C without hepatic coma: Secondary | ICD-10-CM | POA: Diagnosis not present

## 2020-09-12 DIAGNOSIS — E785 Hyperlipidemia, unspecified: Secondary | ICD-10-CM | POA: Diagnosis not present

## 2020-09-12 DIAGNOSIS — K219 Gastro-esophageal reflux disease without esophagitis: Secondary | ICD-10-CM | POA: Diagnosis not present

## 2020-09-12 DIAGNOSIS — Z9089 Acquired absence of other organs: Secondary | ICD-10-CM | POA: Diagnosis not present

## 2020-09-12 DIAGNOSIS — Z954 Presence of other heart-valve replacement: Secondary | ICD-10-CM | POA: Diagnosis not present

## 2020-09-12 DIAGNOSIS — F0281 Dementia in other diseases classified elsewhere with behavioral disturbance: Secondary | ICD-10-CM | POA: Diagnosis not present

## 2020-09-12 DIAGNOSIS — F32A Depression, unspecified: Secondary | ICD-10-CM | POA: Diagnosis not present

## 2020-09-12 DIAGNOSIS — G4089 Other seizures: Secondary | ICD-10-CM | POA: Diagnosis not present

## 2020-09-12 DIAGNOSIS — I5032 Chronic diastolic (congestive) heart failure: Secondary | ICD-10-CM | POA: Diagnosis not present

## 2020-09-12 DIAGNOSIS — N1831 Chronic kidney disease, stage 3a: Secondary | ICD-10-CM | POA: Diagnosis not present

## 2020-09-12 DIAGNOSIS — Z6841 Body Mass Index (BMI) 40.0 and over, adult: Secondary | ICD-10-CM | POA: Diagnosis not present

## 2020-09-12 DIAGNOSIS — E1122 Type 2 diabetes mellitus with diabetic chronic kidney disease: Secondary | ICD-10-CM | POA: Diagnosis not present

## 2020-09-12 NOTE — Progress Notes (Deleted)
Cardiology Office Note    Date:  09/12/2020   ID:  Adam Savage, DOB [birth year], MRN [id number]   PCP:  [name], MD   [place]  Cardiologist:  Larae Grooms, MD *** Advanced Practice Provider:  No care team member to display Electrophysiologist:  None   [phone]   No chief complaint on file.   History of Present Illness:  Adam Savage is a 70 y.o. male with history of mechanical aortic valve replacement, CAD with lesion in the distal LAD thought to be perhaps from embolism from his valve in the setting of  subtherapeutic INR, 01/2018 PEA arrest from hemorrhagic shock with internal bleeding and Mallory-Weiss tear, seizure disorder and dementia, OSA, obesity, HTN, HLD, CKD3   Patient most recently is being evaluated for palliative care/hospice    Past Medical History:  Diagnosis Date  . COPD (chronic obstructive pulmonary disease) ([place])   . Dementia ([place])   . Depression   . Diabetes mellitus without complication ([place])   . GERD (gastroesophageal reflux disease)   . H/O diverticulitis of colon 11/03/2012   History of partial colectomy in 2011.  Marland Kitchen Hepatitis C   . High cholesterol   . Lower extremity edema   . Mechanical heart valve present   . Overdose of muscle relaxant 08/11/2017  . Pre-diabetes   . Retroperitoneal hematoma 01/29/2018  . S/P AVR (aortic valve replacement)   . Seizures ([place])   . 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 CARDIAC SURGERY    . CHOLECYSTECTOMY    . COLON SURGERY    . COLONOSCOPY WITH PROPOFOL N/A 11/25/2019   Procedure: COLONOSCOPY WITH PROPOFOL;  Surgeon: Rogene Houston, MD;  [facility];  Service: Endoscopy;  Laterality: N/A;  . CORONARY/GRAFT ANGIOGRAPHY N/A 10/31/2017   Procedure: CORONARY/GRAFT ANGIOGRAPHY;  Surgeon: Nelva Bush, MD;  Location: [facility];  Service: Cardiovascular;  Laterality: N/A;  .  ESOPHAGOGASTRODUODENOSCOPY (EGD) WITH PROPOFOL N/A 01/18/2018   Procedure: ESOPHAGOGASTRODUODENOSCOPY (EGD) WITH PROPOFOL;  Surgeon: Mauri Pole, MD;  Location: [facility];  Service: Endoscopy;  Laterality: N/A;  . JOINT REPLACEMENT    . KNEE SURGERY Left   . POLYPECTOMY  11/25/2019   Procedure: POLYPECTOMY;  Surgeon: Rogene Houston, MD;  [facility];  Service: Endoscopy;;  . tooth pulled  2021    Current Medications: No outpatient medications have been marked as taking for the 09/25/20 encounter (Appointment) with Adam Burn, PA-C.     Allergies:   Oxycodone, Ativan [lorazepam], and Morphine and related   Social History   Socioeconomic History  . Marital status: Married    Spouse name: Not on file  . Number of children: 3  . Years of education: Not on file  . Highest education level: 12th grade  Occupational History  . Occupation: retired  Tobacco Use  . Smoking status: Former Smoker    Packs/day: 1.00    Types: Cigarettes  . Smokeless tobacco: Never Used  Vaping Use  . Vaping Use: Never used  Substance and Sexual Activity  . Alcohol use: Not Currently  . Drug use: Never  . Sexual activity: Not Currently  Other Topics Concern  . Not on file  Social History Narrative   Right handed       [facility]   Social Determinants of Health   Financial Resource Strain: Not on file  Food  Insecurity: Not on file  Transportation Needs: Not on file  Physical Activity: Not on file  Stress: Not on file  Social Connections: Not on file     Family History:  The patient's ***family history includes Hypertension in his father.   ROS:   Please see the history of present illness.    ROS All other systems reviewed and are negative.   PHYSICAL EXAM:   VS:  There were no vitals taken for this visit.  Physical Exam  GEN: Well nourished, well developed, in no acute distress  HEENT: normal  Neck: no JVD, carotid bruits, or  masses Cardiac:RRR; no murmurs, rubs, or gallops  Respiratory:  clear to auscultation bilaterally, normal work of breathing GI: soft, nontender, nondistended, + BS Ext: without cyanosis, clubbing, or edema, Good distal pulses bilaterally MS: no deformity or atrophy  Skin: warm and dry, no rash Neuro:  Alert and Oriented x 3, Strength and sensation are intact Psych: euthymic mood, full affect  Wt Readings from Last 3 Encounters:  08/12/20 300 lb (136.1 kg)  06/27/20 (!) 306 lb (138.8 kg)  01/27/20 (!) 301 lb (136.5 kg)      Studies/Labs Reviewed:   EKG:  EKG is*** ordered today.  The ekg ordered today demonstrates ***  Recent Labs: 09/28/2019: TSH 1.590 01/27/2020: ALT 20 08/12/2020: B Natriuretic Peptide 106.0; BUN 22; Creatinine, Ser 1.35; Hemoglobin 15.1; Platelets 80; Potassium 3.3; Sodium 135   Lipid Panel    Component Value Date/Time   CHOL 118 11/23/2019 0527   TRIG 55 11/23/2019 0527   HDL 44 11/23/2019 0527   CHOLHDL 2.7 11/23/2019 0527   VLDL 11 11/23/2019 0527   LDLCALC 63 11/23/2019 0527    Additional studies/ records that were reviewed today include:  Echocardiogram: 10/2017 Study Conclusions   - Left ventricle: The cavity size was normal. There was severe    hypertrophy of the basal septum with otherwise moderate    concentric LVH. Systolic function was normal. The estimated    ejection fraction was in the range of 55% to 60%. Wall motion was    normal; there were no regional wall motion abnormalities. Doppler    parameters are consistent with abnormal left ventricular    relaxation (grade 1 diastolic dysfunction). Doppler parameters    are consistent with indeterminate ventricular filling pressure.  - Aortic valve: A bioprosthesis was present and functioning    properly. Valve area (VTI): 2.74 cm^2. Valve area (Vmax): 2.72    cm^2. Valve area (Vmean): 2.7 cm^2.  - Mitral valve: Transvalvular velocity was within the normal range.    There was no  evidence for stenosis. There was no regurgitation.  - Left atrium: The atrium was moderately dilated.  - Right ventricle: The cavity size was normal. Wall thickness was    normal. Systolic function was normal.  - Tricuspid valve: There was trivial regurgitation.  - Pulmonary arteries: Systolic pressure was within the normal    range. PA peak pressure: 27 mm Hg (S).    Cardiac Catheterization: 10/2017 Conclusions: 1. Subtotal occlusion of the distal LAD, possibly thrombotic/embolic in origin.  Otherwise, mild nonobstructive coronary artery disease.   Recommendations: 2. Medical therapy, given distal location and vessel size of distal LAD subtotal occlusion.  This lesion is not amenable to PCI. 3. Restart heparin 2 hours after TR band removal. 4. Restart warfarin and continue aspirin 81 mg daily.   Recommend to resume warfarin per pharmacy dosing on 10/31/17.  Recommend concurrent antiplatelet therapy of  Aspirin 81mg  daily for long-term therapy.    Risk Assessment/Calculations:   {Does this patient have ATRIAL FIBRILLATION?[phone]}     ASSESSMENT:    1. Mechanical heart valve present   2. Coronary artery disease involving native coronary artery of native heart without angina pectoris   3. 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 OSA (obstructive sleep apnea)   5. Morbid obesity ([facility])      PLAN:  In order of problems listed above:  Mechanical AVR needs SBE prophylaxis  CAD with lesion in the distal LAD thought to be embolism from his valve while subtherapeutic INR otherwise nonobstructive disease  History of PEA arrest from hemorrhagic shock with internal bleeding and Mallory-Weiss tear 84/1660  Chronic diastolic CHF  OSA  Obesity  Shared Decision Making/Informed Consent   {Are you ordering a CV Procedure (e.g. stress test, cath, DCCV, TEE, etc)?   Press F2        :[id number]}    Medication Adjustments/Labs and Tests Ordered: Current medicines are  reviewed at length with the patient today.  Concerns regarding medicines are outlined above.  Medication changes, Labs and Tests ordered today are listed in the Patient Instructions below. There are no Patient Instructions on file for this visit.   Signed, Ermalinda Barrios, PA-C  09/12/2020 1:28 PM    [facility] [place], [place], [place]  [address] Phone: [phone]; Fax: [phone]

## 2020-09-13 ENCOUNTER — Telehealth: Payer: Self-pay | Admitting: *Deleted

## 2020-09-13 DIAGNOSIS — F17200 Nicotine dependence, unspecified, uncomplicated: Secondary | ICD-10-CM | POA: Diagnosis not present

## 2020-09-13 DIAGNOSIS — Z954 Presence of other heart-valve replacement: Secondary | ICD-10-CM | POA: Diagnosis not present

## 2020-09-13 DIAGNOSIS — D631 Anemia in chronic kidney disease: Secondary | ICD-10-CM | POA: Diagnosis not present

## 2020-09-13 DIAGNOSIS — F32A Depression, unspecified: Secondary | ICD-10-CM | POA: Diagnosis not present

## 2020-09-13 DIAGNOSIS — K7460 Unspecified cirrhosis of liver: Secondary | ICD-10-CM | POA: Diagnosis not present

## 2020-09-13 DIAGNOSIS — I5032 Chronic diastolic (congestive) heart failure: Secondary | ICD-10-CM | POA: Diagnosis not present

## 2020-09-13 DIAGNOSIS — E1122 Type 2 diabetes mellitus with diabetic chronic kidney disease: Secondary | ICD-10-CM | POA: Diagnosis not present

## 2020-09-13 DIAGNOSIS — G40909 Epilepsy, unspecified, not intractable, without status epilepticus: Secondary | ICD-10-CM | POA: Diagnosis not present

## 2020-09-13 DIAGNOSIS — Z8674 Personal history of sudden cardiac arrest: Secondary | ICD-10-CM | POA: Diagnosis not present

## 2020-09-13 DIAGNOSIS — Z6841 Body Mass Index (BMI) 40.0 and over, adult: Secondary | ICD-10-CM | POA: Diagnosis not present

## 2020-09-13 DIAGNOSIS — F0150 Vascular dementia without behavioral disturbance: Secondary | ICD-10-CM | POA: Diagnosis not present

## 2020-09-13 DIAGNOSIS — Z87311 Personal history of (healed) other pathological fracture: Secondary | ICD-10-CM | POA: Diagnosis not present

## 2020-09-13 DIAGNOSIS — B1920 Unspecified viral hepatitis C without hepatic coma: Secondary | ICD-10-CM | POA: Diagnosis not present

## 2020-09-13 DIAGNOSIS — Z9049 Acquired absence of other specified parts of digestive tract: Secondary | ICD-10-CM | POA: Diagnosis not present

## 2020-09-13 DIAGNOSIS — K056 Periodontal disease, unspecified: Secondary | ICD-10-CM | POA: Diagnosis not present

## 2020-09-13 DIAGNOSIS — Z8719 Personal history of other diseases of the digestive system: Secondary | ICD-10-CM | POA: Diagnosis not present

## 2020-09-13 DIAGNOSIS — N1831 Chronic kidney disease, stage 3a: Secondary | ICD-10-CM | POA: Diagnosis not present

## 2020-09-13 DIAGNOSIS — Z9089 Acquired absence of other organs: Secondary | ICD-10-CM | POA: Diagnosis not present

## 2020-09-13 DIAGNOSIS — I130 Hypertensive heart and chronic kidney disease with heart failure and stage 1 through stage 4 chronic kidney disease, or unspecified chronic kidney disease: Secondary | ICD-10-CM | POA: Diagnosis not present

## 2020-09-13 DIAGNOSIS — M549 Dorsalgia, unspecified: Secondary | ICD-10-CM | POA: Diagnosis not present

## 2020-09-13 DIAGNOSIS — K219 Gastro-esophageal reflux disease without esophagitis: Secondary | ICD-10-CM | POA: Diagnosis not present

## 2020-09-13 DIAGNOSIS — E785 Hyperlipidemia, unspecified: Secondary | ICD-10-CM | POA: Diagnosis not present

## 2020-09-13 DIAGNOSIS — J449 Chronic obstructive pulmonary disease, unspecified: Secondary | ICD-10-CM | POA: Diagnosis not present

## 2020-09-13 DIAGNOSIS — G4733 Obstructive sleep apnea (adult) (pediatric): Secondary | ICD-10-CM | POA: Diagnosis not present

## 2020-09-13 MED ORDER — OXYCODONE HCL 5 MG PO TABS: 5.0000 mg | ORAL_TABLET | Freq: Three times a day (TID) | ORAL | 0 refills | Status: AC | PRN

## 2020-09-13 NOTE — Telephone Encounter (Signed)
All those verbal orders sound appropriate. I have ordered the oxycodone and sent to his pharmacy. Next INR check is due 09/14/20. It looks like he was going to an anticoagulation clinic through [facility], so it would be best to send the INR results to their office if that is possible.

## 2020-09-13 NOTE — Telephone Encounter (Signed)
Call from [place] with [facility] -stated pt is new to their services and they need orders: 1) Requesting verbal order for "Oxygen PRN usage" -stated for comfort also pt has sob with minimal activities; O2 sat 94% at rest. 2) Requesting verbal order for "Duonebs instead of using inhalers" -  Stated which is more effective and pt has audible wheezes. 3) Requesting to change Hydrocodone to Oxycodone 5 mg Q4h PRN ,with a 15 day supply which is all they will cover. Also since pt has hx of liver issues and GI bleed. [facility] [address] which is closer to daughter's home. 4) She needs to know when the doctor wants the next PT/INR. Stated a nurse can go tomorrow - she can accept a verbal order.  Thanks

## 2020-09-13 NOTE — Telephone Encounter (Signed)
Called Mitzi Rn, [place] - Verbal order given for oxygen and duonebs per Dr Evette Doffing. Informed [facility]one [facility]. And informed PT/INR due 6/2 and send results to anticoagulation clinic thru [facility]- stated she will have it done tomorrow and send result to their office.

## 2020-09-14 ENCOUNTER — Other Ambulatory Visit: Payer: Self-pay | Admitting: Internal Medicine

## 2020-09-14 DIAGNOSIS — Z794 Long term (current) use of insulin: Secondary | ICD-10-CM

## 2020-09-14 DIAGNOSIS — E119 Type 2 diabetes mellitus without complications: Secondary | ICD-10-CM

## 2020-09-15 ENCOUNTER — Telehealth: Payer: Self-pay | Admitting: *Deleted

## 2020-09-15 ENCOUNTER — Ambulatory Visit (INDEPENDENT_AMBULATORY_CARE_PROVIDER_SITE_OTHER): Payer: Medicare Other | Admitting: Pharmacist

## 2020-09-15 ENCOUNTER — Telehealth: Payer: Self-pay | Admitting: Pharmacist

## 2020-09-15 DIAGNOSIS — Z952 Presence of prosthetic heart valve: Secondary | ICD-10-CM | POA: Diagnosis not present

## 2020-09-15 DIAGNOSIS — E1122 Type 2 diabetes mellitus with diabetic chronic kidney disease: Secondary | ICD-10-CM | POA: Diagnosis not present

## 2020-09-15 DIAGNOSIS — Z7901 Long term (current) use of anticoagulants: Secondary | ICD-10-CM

## 2020-09-15 DIAGNOSIS — I130 Hypertensive heart and chronic kidney disease with heart failure and stage 1 through stage 4 chronic kidney disease, or unspecified chronic kidney disease: Secondary | ICD-10-CM | POA: Diagnosis not present

## 2020-09-15 DIAGNOSIS — F0150 Vascular dementia without behavioral disturbance: Secondary | ICD-10-CM | POA: Diagnosis not present

## 2020-09-15 DIAGNOSIS — I5032 Chronic diastolic (congestive) heart failure: Secondary | ICD-10-CM | POA: Diagnosis not present

## 2020-09-15 DIAGNOSIS — J449 Chronic obstructive pulmonary disease, unspecified: Secondary | ICD-10-CM | POA: Diagnosis not present

## 2020-09-15 DIAGNOSIS — N1831 Chronic kidney disease, stage 3a: Secondary | ICD-10-CM | POA: Diagnosis not present

## 2020-09-15 LAB — POCT INR: INR: 2.6 (ref 2.0–3.0)

## 2020-09-15 NOTE — Telephone Encounter (Signed)
Called [facility] since we did not receive INR result yesterday. Advised that pt was put on hospice on 5/31 so they discharged him. He is now with [facility]. Called [facility] and spoke with Sherri. She stated she can see the order for INR check yesterday but does not see a visit from yesterday. She will reach out to the case manager and let us know what she finds out.

## 2020-09-15 NOTE — Telephone Encounter (Signed)
Mitzi, RN with [facility] called on behalf of patient's Hospice Case Manager requesting xanax prn to manage patient's anxiety.  Also, requesting VO to go to [facility] physicians for symptom management.

## 2020-09-15 NOTE — Telephone Encounter (Signed)
INR checked today - addressed in separate anticoag encounter.

## 2020-09-15 NOTE — Patient Instructions (Signed)
Description   Continue warfarin 1 tablet daily except 1/2 tablet on Mondays.  Order given to [facility]. Nurse will be going every Tuesday.  Will start weekly checks on Tuesdays

## 2020-09-15 NOTE — Telephone Encounter (Signed)
Agree for Hospice physicians to resume care.

## 2020-09-19 DIAGNOSIS — J449 Chronic obstructive pulmonary disease, unspecified: Secondary | ICD-10-CM | POA: Diagnosis not present

## 2020-09-19 DIAGNOSIS — I5032 Chronic diastolic (congestive) heart failure: Secondary | ICD-10-CM | POA: Diagnosis not present

## 2020-09-19 DIAGNOSIS — N1831 Chronic kidney disease, stage 3a: Secondary | ICD-10-CM | POA: Diagnosis not present

## 2020-09-19 DIAGNOSIS — E1122 Type 2 diabetes mellitus with diabetic chronic kidney disease: Secondary | ICD-10-CM | POA: Diagnosis not present

## 2020-09-19 DIAGNOSIS — I130 Hypertensive heart and chronic kidney disease with heart failure and stage 1 through stage 4 chronic kidney disease, or unspecified chronic kidney disease: Secondary | ICD-10-CM | POA: Diagnosis not present

## 2020-09-19 DIAGNOSIS — F0150 Vascular dementia without behavioral disturbance: Secondary | ICD-10-CM | POA: Diagnosis not present

## 2020-09-22 DIAGNOSIS — I130 Hypertensive heart and chronic kidney disease with heart failure and stage 1 through stage 4 chronic kidney disease, or unspecified chronic kidney disease: Secondary | ICD-10-CM | POA: Diagnosis not present

## 2020-09-22 DIAGNOSIS — N1831 Chronic kidney disease, stage 3a: Secondary | ICD-10-CM | POA: Diagnosis not present

## 2020-09-22 DIAGNOSIS — F0150 Vascular dementia without behavioral disturbance: Secondary | ICD-10-CM | POA: Diagnosis not present

## 2020-09-22 DIAGNOSIS — E1122 Type 2 diabetes mellitus with diabetic chronic kidney disease: Secondary | ICD-10-CM | POA: Diagnosis not present

## 2020-09-22 DIAGNOSIS — J449 Chronic obstructive pulmonary disease, unspecified: Secondary | ICD-10-CM | POA: Diagnosis not present

## 2020-09-22 DIAGNOSIS — I5032 Chronic diastolic (congestive) heart failure: Secondary | ICD-10-CM | POA: Diagnosis not present

## 2020-09-25 ENCOUNTER — Ambulatory Visit: Payer: Medicare Other | Admitting: Physician Assistant

## 2020-09-25 DIAGNOSIS — Z952 Presence of prosthetic heart valve: Secondary | ICD-10-CM

## 2020-09-25 DIAGNOSIS — I38 Endocarditis, valve unspecified: Secondary | ICD-10-CM

## 2020-09-25 DIAGNOSIS — I2510 Atherosclerotic heart disease of native coronary artery without angina pectoris: Secondary | ICD-10-CM

## 2020-09-25 DIAGNOSIS — G4733 Obstructive sleep apnea (adult) (pediatric): Secondary | ICD-10-CM

## 2020-09-26 ENCOUNTER — Ambulatory Visit (INDEPENDENT_AMBULATORY_CARE_PROVIDER_SITE_OTHER): Payer: Medicare Other | Admitting: *Deleted

## 2020-09-26 DIAGNOSIS — N1831 Chronic kidney disease, stage 3a: Secondary | ICD-10-CM | POA: Diagnosis not present

## 2020-09-26 DIAGNOSIS — F0150 Vascular dementia without behavioral disturbance: Secondary | ICD-10-CM | POA: Diagnosis not present

## 2020-09-26 DIAGNOSIS — E1122 Type 2 diabetes mellitus with diabetic chronic kidney disease: Secondary | ICD-10-CM | POA: Diagnosis not present

## 2020-09-26 DIAGNOSIS — J449 Chronic obstructive pulmonary disease, unspecified: Secondary | ICD-10-CM | POA: Diagnosis not present

## 2020-09-26 DIAGNOSIS — I130 Hypertensive heart and chronic kidney disease with heart failure and stage 1 through stage 4 chronic kidney disease, or unspecified chronic kidney disease: Secondary | ICD-10-CM | POA: Diagnosis not present

## 2020-09-26 DIAGNOSIS — I5032 Chronic diastolic (congestive) heart failure: Secondary | ICD-10-CM | POA: Diagnosis not present

## 2020-09-26 LAB — POCT INR: INR: 2.1 (ref 2.0–3.0)

## 2020-09-26 NOTE — Patient Instructions (Signed)
Continue warfarin 1 tablet daily except 1/2 tablet on Mondays.  Order given to [facility]. Nurse will be going every Tuesday.  Recheck in 1 week

## 2020-09-27 ENCOUNTER — Telehealth: Payer: Self-pay

## 2020-09-27 NOTE — Telephone Encounter (Signed)
Received TC from a nurse, Pamala Hurry, at [facility] [place] who states they have Dr. Truman Hayward listed as "attending", but was just informed he is leaving and they need to list another physician name as attending.   RN explained to nurse that Dr. Truman Hayward is a resident who is graduating from the program. Pamala Hurry states they usually have to list an attendings name. Dr. Evette Doffing is listed as attending today and his name was given.  Dr. Evette Doffing, if this is not ok, please let me know. Thank you, SChaplin, RN,BSN

## 2020-09-27 NOTE — Telephone Encounter (Signed)
Sounds good. Thank you

## 2020-09-29 IMAGING — CT CT PELVIS W/ CM
2 of 3 series · 17 of 46 positions shown, 19 images · IV contrast (Omni 300)
Comparison: None.

CLINICAL DATA: Diarrhea and rectal bleeding. Internal hemorrhoids.
Evaluate for fistula.

EXAM:
CT PELVIS WITH CONTRAST
TECHNIQUE: Multidetector CT imaging of the pelvis was performed using the
standard protocol following the bolus administration of intravenous
contrast.
CONTRAST:  100mL OMNIPAQUE IOHEXOL 300 MG/ML  SOLN

[Series 3: pelvis with 5.0 · axial · 0.98mm/px · z∈[+762,+982]mm · 14 of 52 slices shown, 16 images]
[im 4/52  soft-tissue]
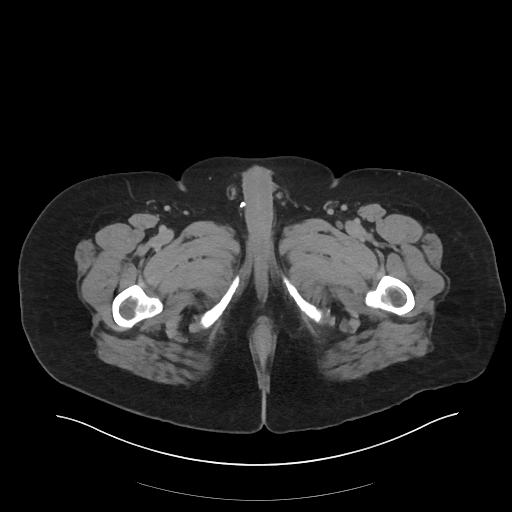
[im 4/52  bone]
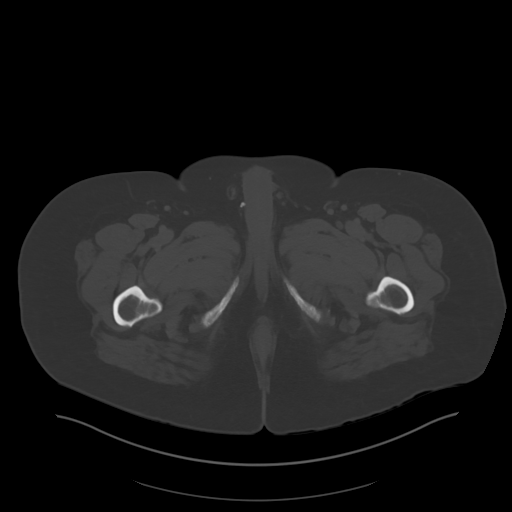
[im 7/52  soft-tissue]
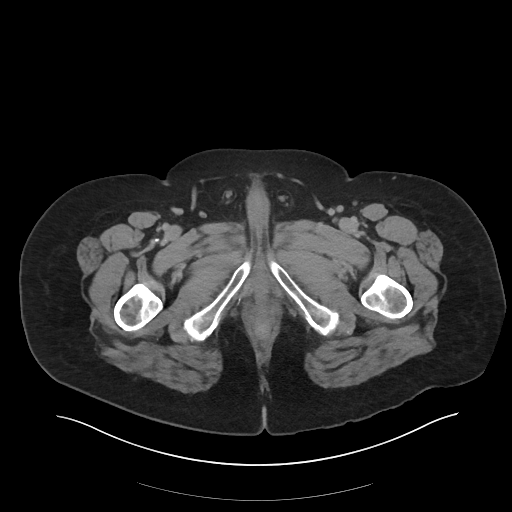
[im 10/52  soft-tissue]
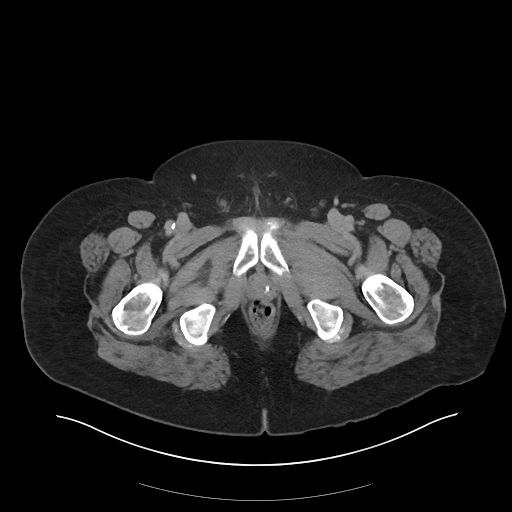
[im 14/52  soft-tissue]
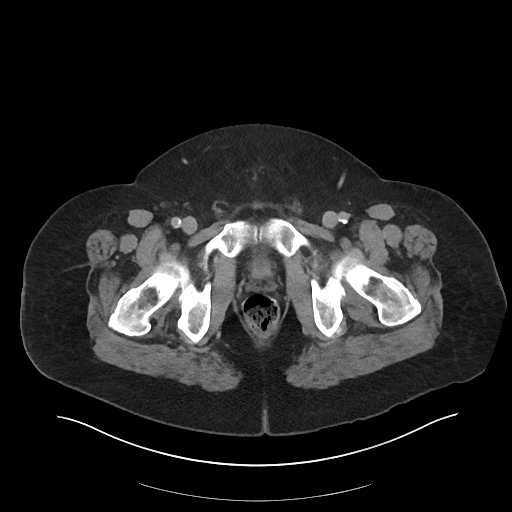
[im 17/52  soft-tissue]
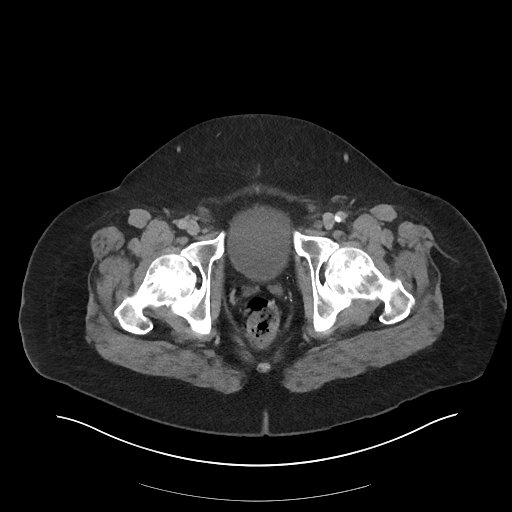
[im 20/52  soft-tissue]
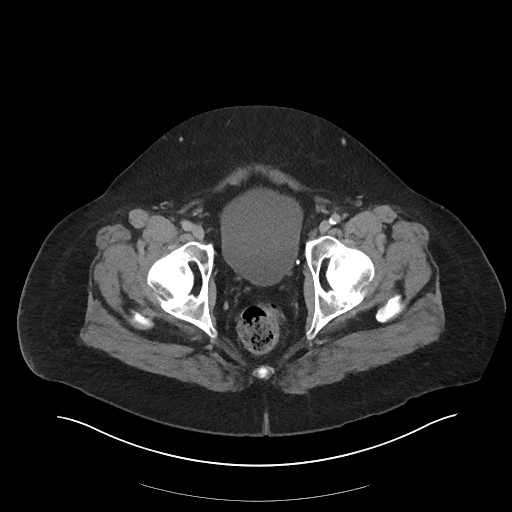
[im 24/52  soft-tissue]
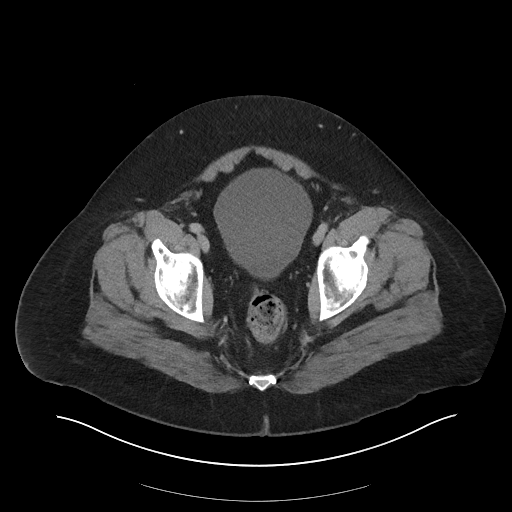
[im 28/52  soft-tissue]
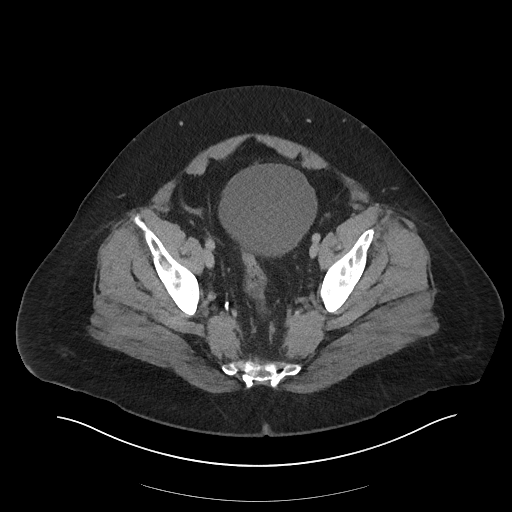
[im 32/52  soft-tissue]
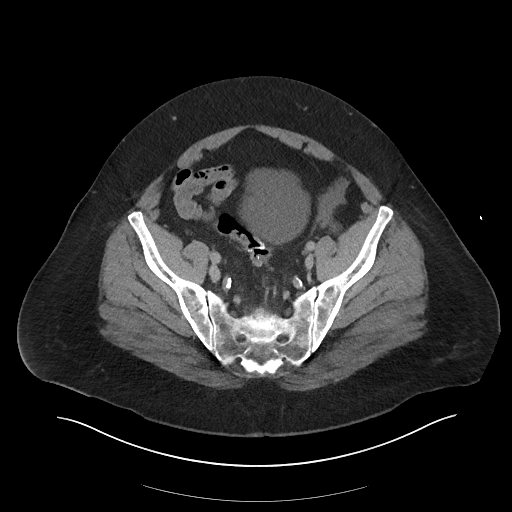
[im 32/52  bone]
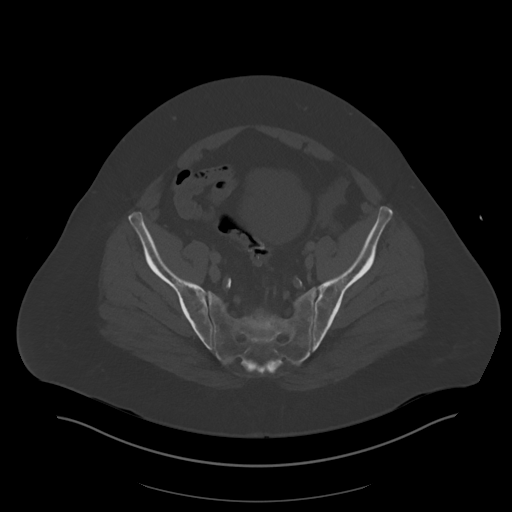
[im 35/52  soft-tissue]
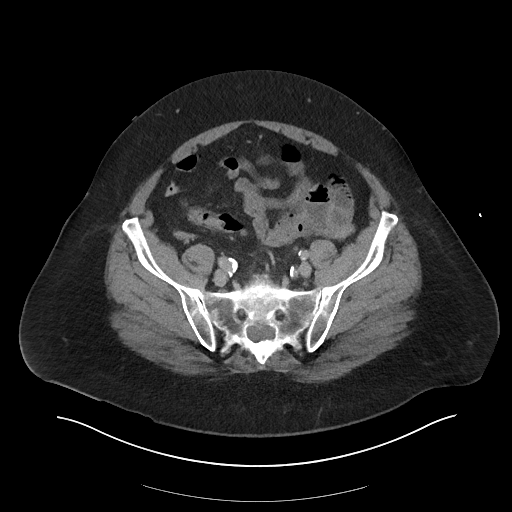
[im 38/52  soft-tissue]
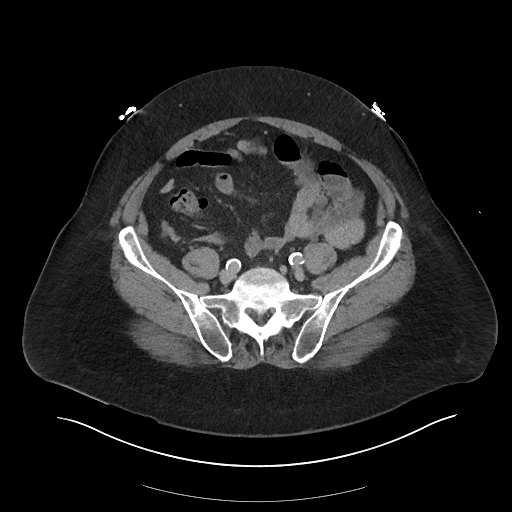
[im 42/52  soft-tissue]
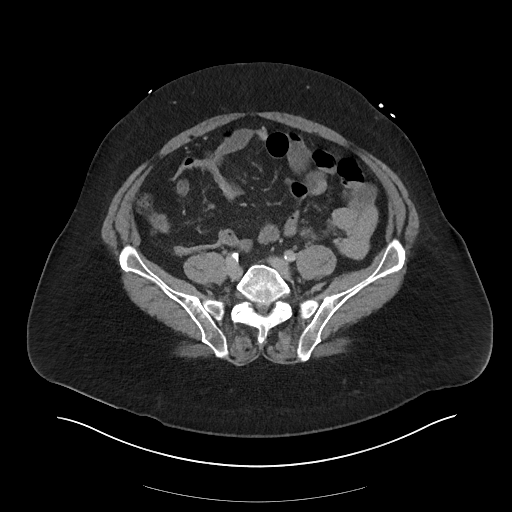
[im 45/52  soft-tissue]
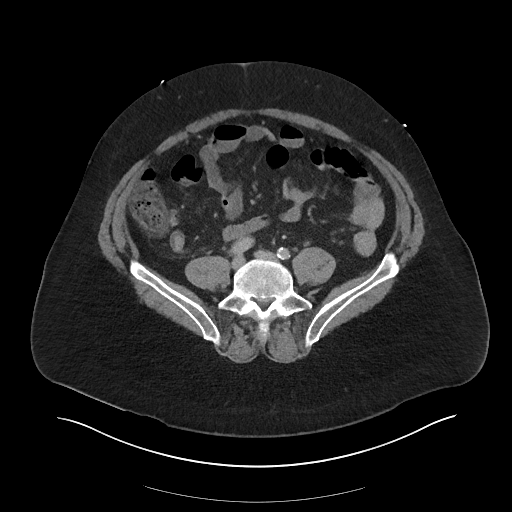
[im 48/52  soft-tissue]
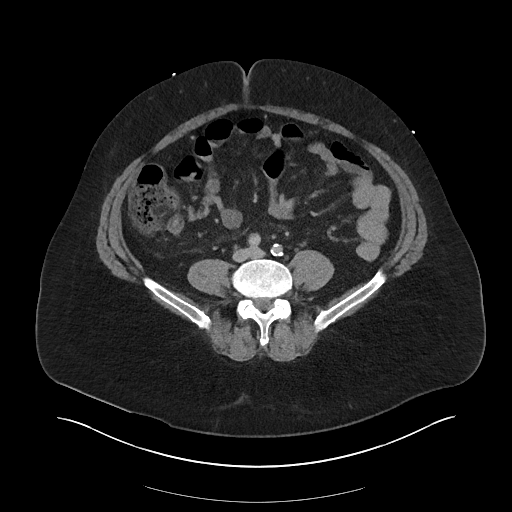

[Series 5: pelvis with 2.0 cor · coronal · 0.53mm/px · 3 of 189 slices shown]
[im 63/189  soft-tissue]
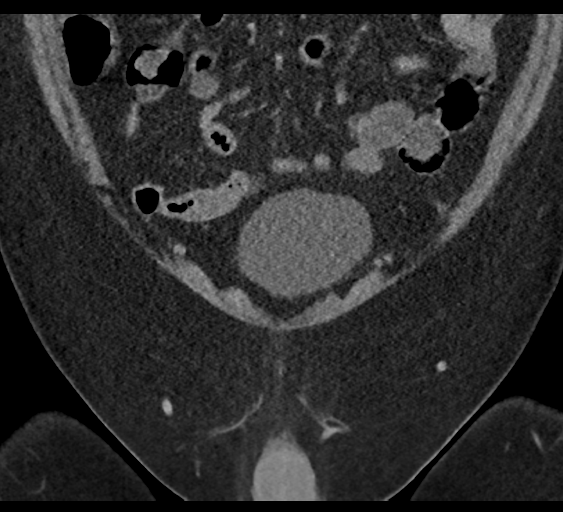
[im 84/189  soft-tissue]
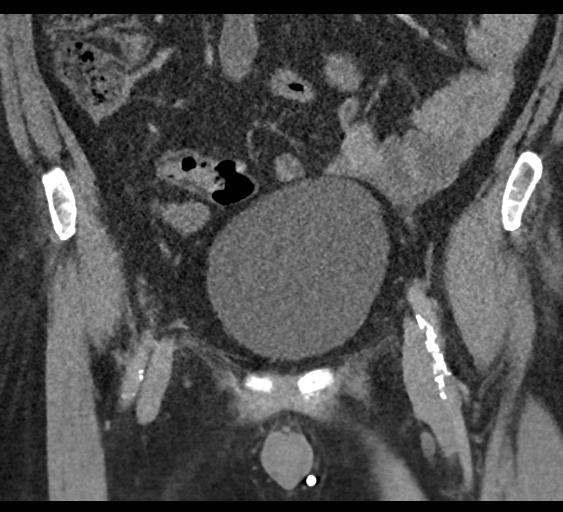
[im 105/189  soft-tissue]
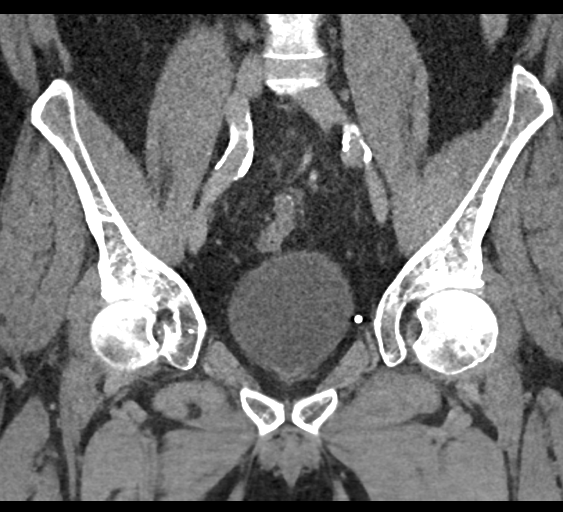

[17 of 46 positions shown; findings below may reference images not displayed]

FINDINGS: Urinary Tract: Mild lobulation of the urinary bladder. No wall
thickening or masslike finding

Bowel: Colon resection with anastomosis at the upper rectum. No
inflammation or detected wall thickening. Visualized small bowel is
negative.

Vascular/Lymphatic: Atherosclerotic calcification

Reproductive: Small prostate with calcifications. No acute finding.

Other:  No ascites or pneumoperitoneum in the imaged abdomen.

Musculoskeletal: No acute or aggressive finding. Congenitally narrow
appearance of the lower lumbar spinal canal due to short pedicles.
S2 Tarlov cysts with mild bony scalloping.
IMPRESSION: No acute finding. No evidence of perianal collection or
inflammation.

## 2020-10-03 ENCOUNTER — Telehealth: Payer: Self-pay | Admitting: Interventional Cardiology

## 2020-10-03 ENCOUNTER — Ambulatory Visit (INDEPENDENT_AMBULATORY_CARE_PROVIDER_SITE_OTHER): Payer: Medicare Other | Admitting: *Deleted

## 2020-10-03 DIAGNOSIS — Z5181 Encounter for therapeutic drug level monitoring: Secondary | ICD-10-CM | POA: Diagnosis not present

## 2020-10-03 DIAGNOSIS — I5032 Chronic diastolic (congestive) heart failure: Secondary | ICD-10-CM | POA: Diagnosis not present

## 2020-10-03 DIAGNOSIS — I130 Hypertensive heart and chronic kidney disease with heart failure and stage 1 through stage 4 chronic kidney disease, or unspecified chronic kidney disease: Secondary | ICD-10-CM | POA: Diagnosis not present

## 2020-10-03 DIAGNOSIS — Z952 Presence of prosthetic heart valve: Secondary | ICD-10-CM | POA: Diagnosis not present

## 2020-10-03 DIAGNOSIS — F0150 Vascular dementia without behavioral disturbance: Secondary | ICD-10-CM | POA: Diagnosis not present

## 2020-10-03 DIAGNOSIS — J449 Chronic obstructive pulmonary disease, unspecified: Secondary | ICD-10-CM | POA: Diagnosis not present

## 2020-10-03 DIAGNOSIS — N1831 Chronic kidney disease, stage 3a: Secondary | ICD-10-CM | POA: Diagnosis not present

## 2020-10-03 DIAGNOSIS — E1122 Type 2 diabetes mellitus with diabetic chronic kidney disease: Secondary | ICD-10-CM | POA: Diagnosis not present

## 2020-10-03 LAB — POCT INR: INR: 2.3 (ref 2.0–3.0)

## 2020-10-03 NOTE — Telephone Encounter (Signed)
Spoke with [place].  Coumadin orders given.  See coumadin note.

## 2020-10-03 NOTE — Telephone Encounter (Signed)
Mitsy [facility] with an inr result to report

## 2020-10-03 NOTE — Telephone Encounter (Signed)
Missy is calling to report the patient's INR today of 2.3 If any changes need to be made she can be reached at [phone]

## 2020-10-03 NOTE — Patient Instructions (Signed)
Continue warfarin 1 tablet daily except 1/2 tablet on Mondays.  Order given to [facility]. Nurse will be going every Tuesday.  Recheck in 1 week

## 2020-10-04 ENCOUNTER — Other Ambulatory Visit: Payer: Self-pay | Admitting: Student

## 2020-10-04 DIAGNOSIS — Z794 Long term (current) use of insulin: Secondary | ICD-10-CM

## 2020-10-06 DIAGNOSIS — I5032 Chronic diastolic (congestive) heart failure: Secondary | ICD-10-CM | POA: Diagnosis not present

## 2020-10-06 DIAGNOSIS — N1831 Chronic kidney disease, stage 3a: Secondary | ICD-10-CM | POA: Diagnosis not present

## 2020-10-06 DIAGNOSIS — I130 Hypertensive heart and chronic kidney disease with heart failure and stage 1 through stage 4 chronic kidney disease, or unspecified chronic kidney disease: Secondary | ICD-10-CM | POA: Diagnosis not present

## 2020-10-06 DIAGNOSIS — F0150 Vascular dementia without behavioral disturbance: Secondary | ICD-10-CM | POA: Diagnosis not present

## 2020-10-06 DIAGNOSIS — E1122 Type 2 diabetes mellitus with diabetic chronic kidney disease: Secondary | ICD-10-CM | POA: Diagnosis not present

## 2020-10-06 DIAGNOSIS — J449 Chronic obstructive pulmonary disease, unspecified: Secondary | ICD-10-CM | POA: Diagnosis not present

## 2020-10-09 DIAGNOSIS — F0150 Vascular dementia without behavioral disturbance: Secondary | ICD-10-CM | POA: Diagnosis not present

## 2020-10-09 DIAGNOSIS — I130 Hypertensive heart and chronic kidney disease with heart failure and stage 1 through stage 4 chronic kidney disease, or unspecified chronic kidney disease: Secondary | ICD-10-CM | POA: Diagnosis not present

## 2020-10-09 DIAGNOSIS — I5032 Chronic diastolic (congestive) heart failure: Secondary | ICD-10-CM | POA: Diagnosis not present

## 2020-10-09 DIAGNOSIS — E1122 Type 2 diabetes mellitus with diabetic chronic kidney disease: Secondary | ICD-10-CM | POA: Diagnosis not present

## 2020-10-09 DIAGNOSIS — J449 Chronic obstructive pulmonary disease, unspecified: Secondary | ICD-10-CM | POA: Diagnosis not present

## 2020-10-09 DIAGNOSIS — N1831 Chronic kidney disease, stage 3a: Secondary | ICD-10-CM | POA: Diagnosis not present

## 2020-10-10 ENCOUNTER — Ambulatory Visit (INDEPENDENT_AMBULATORY_CARE_PROVIDER_SITE_OTHER): Payer: Medicare Other | Admitting: *Deleted

## 2020-10-10 DIAGNOSIS — Z7901 Long term (current) use of anticoagulants: Secondary | ICD-10-CM

## 2020-10-10 DIAGNOSIS — I130 Hypertensive heart and chronic kidney disease with heart failure and stage 1 through stage 4 chronic kidney disease, or unspecified chronic kidney disease: Secondary | ICD-10-CM | POA: Diagnosis not present

## 2020-10-10 DIAGNOSIS — Z5181 Encounter for therapeutic drug level monitoring: Secondary | ICD-10-CM

## 2020-10-10 DIAGNOSIS — F0150 Vascular dementia without behavioral disturbance: Secondary | ICD-10-CM | POA: Diagnosis not present

## 2020-10-10 DIAGNOSIS — I5032 Chronic diastolic (congestive) heart failure: Secondary | ICD-10-CM | POA: Diagnosis not present

## 2020-10-10 DIAGNOSIS — N1831 Chronic kidney disease, stage 3a: Secondary | ICD-10-CM | POA: Diagnosis not present

## 2020-10-10 DIAGNOSIS — E1122 Type 2 diabetes mellitus with diabetic chronic kidney disease: Secondary | ICD-10-CM | POA: Diagnosis not present

## 2020-10-10 DIAGNOSIS — Z952 Presence of prosthetic heart valve: Secondary | ICD-10-CM

## 2020-10-10 DIAGNOSIS — J449 Chronic obstructive pulmonary disease, unspecified: Secondary | ICD-10-CM | POA: Diagnosis not present

## 2020-10-10 LAB — POCT INR: INR: 2.7 (ref 2.0–3.0)

## 2020-10-10 NOTE — Patient Instructions (Signed)
Continue warfarin 1 tablet daily except 1/2 tablet on Mondays.  Order given to [facility]. Nurse will be going every Tuesday.  Recheck in 2 weeks

## 2020-10-11 DIAGNOSIS — R0902 Hypoxemia: Secondary | ICD-10-CM | POA: Diagnosis not present

## 2020-10-11 DIAGNOSIS — F0150 Vascular dementia without behavioral disturbance: Secondary | ICD-10-CM | POA: Diagnosis not present

## 2020-10-11 DIAGNOSIS — Z743 Need for continuous supervision: Secondary | ICD-10-CM | POA: Diagnosis not present

## 2020-10-11 DIAGNOSIS — J449 Chronic obstructive pulmonary disease, unspecified: Secondary | ICD-10-CM | POA: Diagnosis not present

## 2020-10-11 DIAGNOSIS — I5032 Chronic diastolic (congestive) heart failure: Secondary | ICD-10-CM | POA: Diagnosis not present

## 2020-10-11 DIAGNOSIS — R404 Transient alteration of awareness: Secondary | ICD-10-CM | POA: Diagnosis not present

## 2020-10-11 DIAGNOSIS — I130 Hypertensive heart and chronic kidney disease with heart failure and stage 1 through stage 4 chronic kidney disease, or unspecified chronic kidney disease: Secondary | ICD-10-CM | POA: Diagnosis not present

## 2020-10-11 DIAGNOSIS — N1831 Chronic kidney disease, stage 3a: Secondary | ICD-10-CM | POA: Diagnosis not present

## 2020-10-11 DIAGNOSIS — E1122 Type 2 diabetes mellitus with diabetic chronic kidney disease: Secondary | ICD-10-CM | POA: Diagnosis not present

## 2020-10-11 DIAGNOSIS — R279 Unspecified lack of coordination: Secondary | ICD-10-CM | POA: Diagnosis not present

## 2020-10-11 DIAGNOSIS — I959 Hypotension, unspecified: Secondary | ICD-10-CM | POA: Diagnosis not present

## 2020-10-12 ENCOUNTER — Other Ambulatory Visit: Payer: Self-pay | Admitting: Internal Medicine

## 2020-10-12 DIAGNOSIS — F0150 Vascular dementia without behavioral disturbance: Secondary | ICD-10-CM | POA: Diagnosis not present

## 2020-10-12 DIAGNOSIS — I5032 Chronic diastolic (congestive) heart failure: Secondary | ICD-10-CM | POA: Diagnosis not present

## 2020-10-12 DIAGNOSIS — N1831 Chronic kidney disease, stage 3a: Secondary | ICD-10-CM | POA: Diagnosis not present

## 2020-10-12 DIAGNOSIS — J449 Chronic obstructive pulmonary disease, unspecified: Secondary | ICD-10-CM | POA: Diagnosis not present

## 2020-10-12 DIAGNOSIS — E1122 Type 2 diabetes mellitus with diabetic chronic kidney disease: Secondary | ICD-10-CM | POA: Diagnosis not present

## 2020-10-12 DIAGNOSIS — I130 Hypertensive heart and chronic kidney disease with heart failure and stage 1 through stage 4 chronic kidney disease, or unspecified chronic kidney disease: Secondary | ICD-10-CM | POA: Diagnosis not present

## 2020-10-12 DIAGNOSIS — Z794 Long term (current) use of insulin: Secondary | ICD-10-CM

## 2020-10-13 DIAGNOSIS — Z8674 Personal history of sudden cardiac arrest: Secondary | ICD-10-CM | POA: Diagnosis not present

## 2020-10-13 DIAGNOSIS — Z87311 Personal history of (healed) other pathological fracture: Secondary | ICD-10-CM | POA: Diagnosis not present

## 2020-10-13 DIAGNOSIS — G40909 Epilepsy, unspecified, not intractable, without status epilepticus: Secondary | ICD-10-CM | POA: Diagnosis not present

## 2020-10-13 DIAGNOSIS — F32A Depression, unspecified: Secondary | ICD-10-CM | POA: Diagnosis not present

## 2020-10-13 DIAGNOSIS — E1122 Type 2 diabetes mellitus with diabetic chronic kidney disease: Secondary | ICD-10-CM | POA: Diagnosis not present

## 2020-10-13 DIAGNOSIS — Z9089 Acquired absence of other organs: Secondary | ICD-10-CM | POA: Diagnosis not present

## 2020-10-13 DIAGNOSIS — B1920 Unspecified viral hepatitis C without hepatic coma: Secondary | ICD-10-CM | POA: Diagnosis not present

## 2020-10-13 DIAGNOSIS — D631 Anemia in chronic kidney disease: Secondary | ICD-10-CM | POA: Diagnosis not present

## 2020-10-13 DIAGNOSIS — Z9049 Acquired absence of other specified parts of digestive tract: Secondary | ICD-10-CM | POA: Diagnosis not present

## 2020-10-13 DIAGNOSIS — E785 Hyperlipidemia, unspecified: Secondary | ICD-10-CM | POA: Diagnosis not present

## 2020-10-13 DIAGNOSIS — M549 Dorsalgia, unspecified: Secondary | ICD-10-CM | POA: Diagnosis not present

## 2020-10-13 DIAGNOSIS — J449 Chronic obstructive pulmonary disease, unspecified: Secondary | ICD-10-CM | POA: Diagnosis not present

## 2020-10-13 DIAGNOSIS — K7460 Unspecified cirrhosis of liver: Secondary | ICD-10-CM | POA: Diagnosis not present

## 2020-10-13 DIAGNOSIS — F0150 Vascular dementia without behavioral disturbance: Secondary | ICD-10-CM | POA: Diagnosis not present

## 2020-10-13 DIAGNOSIS — G4733 Obstructive sleep apnea (adult) (pediatric): Secondary | ICD-10-CM | POA: Diagnosis not present

## 2020-10-13 DIAGNOSIS — N1831 Chronic kidney disease, stage 3a: Secondary | ICD-10-CM | POA: Diagnosis not present

## 2020-10-13 DIAGNOSIS — Z954 Presence of other heart-valve replacement: Secondary | ICD-10-CM | POA: Diagnosis not present

## 2020-10-13 DIAGNOSIS — I130 Hypertensive heart and chronic kidney disease with heart failure and stage 1 through stage 4 chronic kidney disease, or unspecified chronic kidney disease: Secondary | ICD-10-CM | POA: Diagnosis not present

## 2020-10-13 DIAGNOSIS — F17200 Nicotine dependence, unspecified, uncomplicated: Secondary | ICD-10-CM | POA: Diagnosis not present

## 2020-10-13 DIAGNOSIS — Z8719 Personal history of other diseases of the digestive system: Secondary | ICD-10-CM | POA: Diagnosis not present

## 2020-10-13 DIAGNOSIS — K219 Gastro-esophageal reflux disease without esophagitis: Secondary | ICD-10-CM | POA: Diagnosis not present

## 2020-10-13 DIAGNOSIS — K056 Periodontal disease, unspecified: Secondary | ICD-10-CM | POA: Diagnosis not present

## 2020-10-13 DIAGNOSIS — I5032 Chronic diastolic (congestive) heart failure: Secondary | ICD-10-CM | POA: Diagnosis not present

## 2020-10-13 DIAGNOSIS — Z6841 Body Mass Index (BMI) 40.0 and over, adult: Secondary | ICD-10-CM | POA: Diagnosis not present

## 2020-10-14 DIAGNOSIS — J449 Chronic obstructive pulmonary disease, unspecified: Secondary | ICD-10-CM | POA: Diagnosis not present

## 2020-10-14 DIAGNOSIS — I130 Hypertensive heart and chronic kidney disease with heart failure and stage 1 through stage 4 chronic kidney disease, or unspecified chronic kidney disease: Secondary | ICD-10-CM | POA: Diagnosis not present

## 2020-10-14 DIAGNOSIS — I5032 Chronic diastolic (congestive) heart failure: Secondary | ICD-10-CM | POA: Diagnosis not present

## 2020-10-14 DIAGNOSIS — F0150 Vascular dementia without behavioral disturbance: Secondary | ICD-10-CM | POA: Diagnosis not present

## 2020-10-14 DIAGNOSIS — N1831 Chronic kidney disease, stage 3a: Secondary | ICD-10-CM | POA: Diagnosis not present

## 2020-10-14 DIAGNOSIS — E1122 Type 2 diabetes mellitus with diabetic chronic kidney disease: Secondary | ICD-10-CM | POA: Diagnosis not present

## 2020-10-17 ENCOUNTER — Encounter: Payer: Self-pay | Admitting: *Deleted

## 2020-11-13 DEATH — deceased

## 2020-11-20 ENCOUNTER — Other Ambulatory Visit: Payer: Medicare Other

## 2021-01-29 ENCOUNTER — Ambulatory Visit: Payer: Medicare Other | Admitting: Internal Medicine

## 2023-09-02 ENCOUNTER — Encounter (INDEPENDENT_AMBULATORY_CARE_PROVIDER_SITE_OTHER): Payer: Self-pay
